# Patient Record
Sex: Male | Born: 1950 | Race: White | Hispanic: No | Marital: Married | Smoking: Former smoker
Health system: Southern US, Community
[De-identification: ages and names within clinical notes are randomized; demographics above are authoritative.]

## PROBLEM LIST (undated history)

## (undated) DIAGNOSIS — R918 Other nonspecific abnormal finding of lung field: Secondary | ICD-10-CM

## (undated) DIAGNOSIS — M1990 Unspecified osteoarthritis, unspecified site: Secondary | ICD-10-CM

## (undated) DIAGNOSIS — Z8719 Personal history of other diseases of the digestive system: Secondary | ICD-10-CM

## (undated) DIAGNOSIS — N400 Enlarged prostate without lower urinary tract symptoms: Secondary | ICD-10-CM

## (undated) DIAGNOSIS — Z9689 Presence of other specified functional implants: Secondary | ICD-10-CM

## (undated) DIAGNOSIS — G709 Myoneural disorder, unspecified: Secondary | ICD-10-CM

## (undated) DIAGNOSIS — R011 Cardiac murmur, unspecified: Secondary | ICD-10-CM

## (undated) DIAGNOSIS — Z9889 Other specified postprocedural states: Secondary | ICD-10-CM

## (undated) DIAGNOSIS — H409 Unspecified glaucoma: Secondary | ICD-10-CM

## (undated) DIAGNOSIS — Z87898 Personal history of other specified conditions: Secondary | ICD-10-CM

## (undated) DIAGNOSIS — I10 Essential (primary) hypertension: Secondary | ICD-10-CM

## (undated) DIAGNOSIS — K219 Gastro-esophageal reflux disease without esophagitis: Secondary | ICD-10-CM

## (undated) DIAGNOSIS — J449 Chronic obstructive pulmonary disease, unspecified: Secondary | ICD-10-CM

## (undated) DIAGNOSIS — M48061 Spinal stenosis, lumbar region without neurogenic claudication: Secondary | ICD-10-CM

## (undated) DIAGNOSIS — M549 Dorsalgia, unspecified: Secondary | ICD-10-CM

## (undated) DIAGNOSIS — H3530 Unspecified macular degeneration: Secondary | ICD-10-CM

## (undated) DIAGNOSIS — R112 Nausea with vomiting, unspecified: Secondary | ICD-10-CM

## (undated) DIAGNOSIS — C801 Malignant (primary) neoplasm, unspecified: Secondary | ICD-10-CM

## (undated) DIAGNOSIS — R0600 Dyspnea, unspecified: Secondary | ICD-10-CM

## (undated) DIAGNOSIS — D329 Benign neoplasm of meninges, unspecified: Secondary | ICD-10-CM

## (undated) HISTORY — DX: Other nonspecific abnormal finding of lung field: R91.8

## (undated) HISTORY — DX: Dorsalgia, unspecified: M54.9

## (undated) HISTORY — DX: Unspecified glaucoma: H40.9

## (undated) HISTORY — DX: Spinal stenosis, lumbar region without neurogenic claudication: M48.061

## (undated) HISTORY — PX: EYE SURGERY: SHX253

## (undated) HISTORY — PX: CERVICAL FUSION: SHX112

## (undated) HISTORY — PX: CYSTOSCOPY: SUR368

## (undated) HISTORY — PX: WRIST GANGLION EXCISION: SUR520

## (undated) HISTORY — PX: PILONIDAL CYST EXCISION: SHX744

## (undated) HISTORY — PX: SINUS EXPLORATION: SHX5214

---

## 1999-07-04 ENCOUNTER — Ambulatory Visit (HOSPITAL_COMMUNITY): Admission: RE | Admit: 1999-07-04 | Discharge: 1999-07-04 | Payer: Self-pay | Admitting: Geriatric Medicine

## 1999-10-26 ENCOUNTER — Encounter: Admission: RE | Admit: 1999-10-26 | Discharge: 1999-10-26 | Payer: Self-pay | Admitting: Otolaryngology

## 1999-10-26 ENCOUNTER — Encounter: Payer: Self-pay | Admitting: Otolaryngology

## 2001-09-25 ENCOUNTER — Encounter: Payer: Self-pay | Admitting: Geriatric Medicine

## 2001-09-25 ENCOUNTER — Encounter: Admission: RE | Admit: 2001-09-25 | Discharge: 2001-09-25 | Payer: Self-pay | Admitting: Geriatric Medicine

## 2002-10-19 ENCOUNTER — Ambulatory Visit (HOSPITAL_COMMUNITY): Admission: RE | Admit: 2002-10-19 | Discharge: 2002-10-19 | Payer: Self-pay | Admitting: Gastroenterology

## 2003-11-04 ENCOUNTER — Encounter (INDEPENDENT_AMBULATORY_CARE_PROVIDER_SITE_OTHER): Payer: Self-pay | Admitting: *Deleted

## 2003-11-04 ENCOUNTER — Ambulatory Visit (HOSPITAL_BASED_OUTPATIENT_CLINIC_OR_DEPARTMENT_OTHER): Admission: RE | Admit: 2003-11-04 | Discharge: 2003-11-04 | Payer: Self-pay | Admitting: Otolaryngology

## 2003-11-04 ENCOUNTER — Ambulatory Visit (HOSPITAL_COMMUNITY): Admission: RE | Admit: 2003-11-04 | Discharge: 2003-11-04 | Payer: Self-pay | Admitting: Otolaryngology

## 2004-01-30 ENCOUNTER — Encounter: Admission: RE | Admit: 2004-01-30 | Discharge: 2004-01-30 | Payer: Self-pay | Admitting: Geriatric Medicine

## 2004-02-28 ENCOUNTER — Ambulatory Visit (HOSPITAL_COMMUNITY): Admission: RE | Admit: 2004-02-28 | Discharge: 2004-02-29 | Payer: Self-pay | Admitting: Neurosurgery

## 2005-02-16 ENCOUNTER — Encounter: Admission: RE | Admit: 2005-02-16 | Discharge: 2005-02-16 | Payer: Self-pay | Admitting: Neurosurgery

## 2005-03-05 ENCOUNTER — Ambulatory Visit (HOSPITAL_COMMUNITY): Admission: RE | Admit: 2005-03-05 | Discharge: 2005-03-05 | Payer: Self-pay | Admitting: Neurosurgery

## 2005-08-31 ENCOUNTER — Encounter: Admission: RE | Admit: 2005-08-31 | Discharge: 2005-08-31 | Payer: Self-pay | Admitting: Neurosurgery

## 2006-01-04 ENCOUNTER — Encounter: Admission: RE | Admit: 2006-01-04 | Discharge: 2006-01-04 | Payer: Self-pay | Admitting: Neurosurgery

## 2006-01-28 ENCOUNTER — Inpatient Hospital Stay (HOSPITAL_COMMUNITY): Admission: RE | Admit: 2006-01-28 | Discharge: 2006-01-30 | Payer: Self-pay | Admitting: Neurosurgery

## 2006-06-08 HISTORY — PX: BACK SURGERY: SHX140

## 2007-01-02 HISTORY — PX: WRIST SURGERY: SHX841

## 2008-01-02 HISTORY — PX: KNEE ARTHROSCOPY: SUR90

## 2008-01-13 ENCOUNTER — Encounter (INDEPENDENT_AMBULATORY_CARE_PROVIDER_SITE_OTHER): Payer: Self-pay | Admitting: Orthopedic Surgery

## 2008-01-13 ENCOUNTER — Ambulatory Visit (HOSPITAL_BASED_OUTPATIENT_CLINIC_OR_DEPARTMENT_OTHER): Admission: RE | Admit: 2008-01-13 | Discharge: 2008-01-13 | Payer: Self-pay | Admitting: Orthopedic Surgery

## 2008-01-29 ENCOUNTER — Encounter: Admission: RE | Admit: 2008-01-29 | Discharge: 2008-01-29 | Payer: Self-pay | Admitting: Geriatric Medicine

## 2008-03-08 ENCOUNTER — Ambulatory Visit (HOSPITAL_COMMUNITY): Admission: RE | Admit: 2008-03-08 | Discharge: 2008-03-08 | Payer: Self-pay | Admitting: Neurosurgery

## 2008-04-29 ENCOUNTER — Encounter: Admission: RE | Admit: 2008-04-29 | Discharge: 2008-04-29 | Payer: Self-pay | Admitting: Neurosurgery

## 2008-07-09 ENCOUNTER — Encounter: Admission: RE | Admit: 2008-07-09 | Discharge: 2008-07-09 | Payer: Self-pay | Admitting: Orthopedic Surgery

## 2009-07-26 ENCOUNTER — Encounter: Admission: RE | Admit: 2009-07-26 | Discharge: 2009-07-26 | Payer: Self-pay | Admitting: Neurosurgery

## 2009-07-28 ENCOUNTER — Encounter: Admission: RE | Admit: 2009-07-28 | Discharge: 2009-07-28 | Payer: Self-pay | Admitting: Neurosurgery

## 2010-04-13 LAB — BASIC METABOLIC PANEL
BUN: 18 mg/dL (ref 6–23)
CO2: 25 mEq/L (ref 19–32)
Calcium: 9.3 mg/dL (ref 8.4–10.5)
GFR calc Af Amer: >60 mL/min (ref >60)
Glucose, Bld: 99 mg/dL (ref 70–99)

## 2010-04-13 LAB — CBC
HCT: 39 % (ref 39.0–52.0)
Hemoglobin: 13.1 g/dL (ref 13.0–17.0)
MCHC: 33.6 g/dL (ref 30.0–36.0)
Platelets: 323 10*3/uL (ref 150–400)
RBC: 5.01 MIL/uL (ref 4.22–5.81)
WBC: 5.7 10*3/uL (ref 4.0–10.5)

## 2010-04-13 LAB — DIFFERENTIAL
Basophils Relative: 1 % (ref 0–1)
Monocytes Relative: 15 % — ABNORMAL HIGH (ref 3–12)

## 2010-04-13 LAB — TYPE AND SCREEN

## 2010-04-17 LAB — POCT I-STAT, CHEM 8
BUN: 22 mg/dL (ref 6–23)
Calcium, Ion: 1.18 mmol/L (ref 1.12–1.32)
Chloride: 102 mEq/L (ref 96–112)
Potassium: 4.4 mEq/L (ref 3.5–5.1)
Sodium: 140 mEq/L (ref 135–145)

## 2010-05-16 NOTE — Op Note (Signed)
NAME:  Cody Tate, Cody Tate NO.:  [id number]   MEDICAL RECORD NO.:  [id number]          PATIENT TYPE:  OIB   LOCATION:  3533                         FACILITY:  [facility]   PHYSICIAN:  Kathaleen Maser. Pool, M.D.    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   PROCEDURE:  C5-6 and C6-7 anterior cervical diskectomy and fusion with  allograft and plating.   SURGEON:  Kathaleen Maser. Pool, MD   ASSISTANT:  Donalee Citrin, MD   ANESTHESIA:  General endotracheal.   INDICATION:  Mr. Dente is a 60 year old male with history of neck and  upper extremity pain, paresthesias and weakness consistent with early  cervical myelopathy.  Workup demonstrates evidence of cervical  spondylosis with broad-based disk bulging and resultant stenosis at the  C5-6 level and C6-7 level.  The patient presents now for two-level  anterior cervical diskectomy and fusion allografting and plating in  hopes of improving his symptoms.   OPERATIVE NOTE:  The patient was taken to the operating room and placed  on the operating table in supine position.  After adequate level of  anesthesia was achieved, the patient was positioned supine with neck  slightly extended up and held in place with halter traction.  The  patient's anterior cervical region was prepped and draped sterilely.  A  10 blade was used to make a linear skin incision overlying the C6  vertebral level.  This was carried sharply down to the platysma.  The  platysma then divided vertically and dissection proceeded on the medial  border of the sternocleidomastoid muscle and carotid sheath.  Trachea  and esophagus were mobilized and tracked towards the left.  Prevertebral  fascia was stripped off  the anterior spinal column.  The longus colli  muscle was elevated bilaterally using electrocautery.  Deep self-  retaining retractor was placed.  Intraoperative fluoroscopy was used and  the C5-6 and C6-7 levels were confirmed.  Disk space then incised with  15 blade in rectangular fashion.  Wide disk space clean-out then  achieved using pituitary rongeurs, forward and backward Carlens  curettes, Kerrison rongeurs, and high-speed drill.  The elements of disk  removed down to the level of the posterior annulus.  Microscope was then  brought into field and used throughout the remainder of the diskectomy.  The remaining aspects of the annulus and osteophytes were removed using  a high-speed drill down to the level of the posterior longitudinal  ligament.  Posterior longitudinal ligament was then elevated and  resected in piecemeal fashion using Kerrison rongeur rongeurs.  The  underlying thecal sac was then identified.  A wide central decompression  was then performed by undercutting the bodies of C5 and C6.  Decompression then proceeded out in each neural foramen.  Wide anterior  foraminotomies were then performed along the course of the exiting  C6  nerve roots bilaterally.  At this point, a very thorough decompression  was achieved.  There was no evidence of injury to thecal sac or nerve  roots.  The C6-7 level was then decompressed in a similar fashion.  Wound was then irrigated with antibiotic solution.  Gelfoam was placed  topically for hemostasis and found to be good.  A 7-mm Cornerstone  allograft wedge was then impacted into place at C5-6, an 8-mm wedge was  then impacted into place at C6-7.  A 47-mm Atlantis anterior cervical  plate was then placed over the C5, C6 and C7 levels.  This was then  attached under fluoroscopic guidance using 13-mm variable angle screws  to each at all three levels.  All six screws were given final tightening  and found to be solid bone.  Locking screws  were engaged in all three  levels.  Final images revealed good position of the bone grafts and  hardware at proper operative level with normal alignment of spine.  The  wound was then irrigated with antibiotic solution.  Hemostasis was  ensured with bipolar electrocautery.  Wound was then closed in typical  fashion.  Steri-Strips and sterile dressings  were applied.  There were  no complications.  The patient tolerated the procedure well and he  returns to the recovery room postoperatively.           ______________________________  Kathaleen Maser Pool, M.D.     HAP/MEDQ  D:  03/08/2008  T:  03/08/2008  Job:  [id number]

## 2010-05-16 NOTE — Op Note (Signed)
NAME:  CARLIN, ATTRIDGE NO.:  [id number]   MEDICAL RECORD NO.:  [id number]          PATIENT TYPE:  AMB   LOCATION:  DSC                          FACILITY:  [facility]   PHYSICIAN:  Katy Fitch. Sypher, M.D. 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   OPERATION:  Radical resection of large mass, left volar radial wrist  with sacrifice of most palmar branch of radial superficial sensory  branches and partial sacrifice of middle radial superficial sensory  branch and lysis of radial artery and resection with deep forearm  fascia, periosteum, first dorsal compartment, and flexor sheath of  flexor carpi radialis and pronator teres muscles for margin.  This would  be considered a wide marginal excision with detailed marking of the  margins of the tumor with vascular clips.  The sacrificed superficial  radial sensory branch was tagged on its proximal stump with a 6-0  Prolene suture for delayed identification on a p.r.n. basis.   OPERATING SURGEON:  Katy Fitch. Sypher, MD   ASSISTANT:  Marveen Reeks  Dasnoit, PA-C   ANESTHESIA:  General by LMA.   SUPERVISING ANESTHESIOLOGIST:  Burna Forts, MD   INDICATIONS:  Dimitrios Balestrieri is a 60 year old gentleman referred through  the courtesy of Dr. Merlene Laughter for evaluation of a rapidly enlarging  right volar wrist tumor.   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  He  notes that approximately 2 years ago he had a small mass on the volar  aspect of his left radial wrist adjacent to the radial artery.  He was  seen by an orthopedic surgeon who advised him to observe it thinking  that it was more likely not a ganglion cyst.   Over the ensuing 18-24 months, he noted rather dramatic increase in size  of this mass.   Recently, he was seen by Dr. Merlene Laughter for a routine evaluation.  Dr. Pete Glatter was quite concerned about the size of the mass and the  fact that it was tenting the skin and causing some skin discoloration,  therefore an urgent hand upper extremity orthopedic consult was  obtained.   Mr. Handel was sent for an as-soon-as-possible MRI of the wrist with  contrast.  The MRI was discussed in detail with the attending  radiologist, Dr. Vear Clock, who  was of the opinion that this represented  a soft tissue tumor.  The tumor was not pushing structures aggressively  and did not appear to be causing erosion of the radius.  The radial  artery was subjacent to the tumor and well visualized with the contrast  imaging study.  The tumor abutted the flexor carpi radialis, the  periosteum of the radius, the first dorsal compartment and could not be  separated from the radial superficial sensory branches.   A specific diagnosis could not be entertained short of a tissue  diagnosis.   I had a lengthy informed consent with Mr. Ferrara, which I pointed out  that there was a small chance that this could represent a malignant  lesion.  We will follow the principles of tumor surgery in approaching  this with the  biopsy, attempting to obtain wide margins and if need be  sacrificing neurovascular structures that are involved with tumor.   Preoperatively, he was advised in detail that we could not offer  prognosis until we had a histopathologic diagnosis.  I pointed out the  soft tissue tumors of this nature many times are referred to tertiary  care institutions and national specialists for identification of  atypical musculoskeletal pathology.   Our goal was to perform either an incisional or excisional biopsy based  on what we identified at the time of surgery.  He was advised that we  would need to wait at least 10 days to have a histopathologic response  from the pathologist.   Questions were invited and answered in detail.   PROCEDURE:  Zadie Rhine was brought to the operating room and  placed int he supine position upon the operating table.   Following a consult with Dr. Jacklynn Bue, general anesthesia by LMA  technique was recommended and accepted by Mr. Angus.   He was brought to room #6 of the [facility], placed in the  supine position upon the operating table, and under Dr. Marlane Mingle  direct supervision, general anesthesia by LMA technique was induced.   The left arm was prepped with Betadine soap and solution, sterilely  draped.  Ancef 1 g was administered as an IV prophylactic antibiotic.   The left arm was exsanguinated with Esmarch bandage and the arterial  tourniquet was inflated at 250 mmHg on the proximal left brachium.   The procedure commenced with planning of an extensile exposure from the  mid distal forearm over the radial artery to the base of the thumb  metacarpal.  Our plan was to identify the radial artery and its  accompanying veins proximal and distal to the mass as well as the radial  superficial sensory branches and the flexor carpi radialis and first  dorsal compartment muscles.   The skin incision was taken sharply and skin flaps were  elevated with a  margin of at least 4-5 mm of normal-appearing tissue off the mass.  The  radial artery and its accompanying veins were identified proximally and  distally and identified by vessel loop wraps.  The lesion was densely  adherent to the first dorsal compartment, abutted the fascia of the  flexor carpi radialis, and encapsulated the radial artery, but did not  invade the radial artery.   With great care, the tumor was resected with a 1-cm margin of fat and  soft tissue when possible and on the radius, it was resected directly  off the periosteum of the radius.  The first dorsal compartment was  resected  with the mass, the fascia of the flexor carpi radialis, and the  fascia overlying the pronator teres were resected with the mass and  after careful study, it was apparent that the palmar radial superficial  sensory branch was intimately involved with the mass, therefore this was  resected with a more than 1-cm margin and the proximal stump of the  nerve tagged with a 6-0 Prolene suture.  A secondary branch of the  middle radial superficial sensory branches was also involved directly in  the mass and this was sacrificed as well.  The main portion of the  middle branch was dissected free and protected with a vessel loop.  The  feeding vessels and vessels that coursing through the branch were both  suture ligated and marked at the margins of the periosteum and at deep  feeding points with metal vascular clamps.  Should a secondary  dissection be required, the clamps could serve as landmarks for the  margins of the lesion as well as blood supply.   After a very lengthy dissection under tourniquet conditions, the radial  artery was removed from the pseudocapsule and preserved.  The veins  accompanying the radial artery were sacrificed and they appeared to have  drainage from the mass and ultimately the mass was resected directly off  the periosteum of the radius, radial to the  insertion of pronator  quadratus muscle.  After completing the excision the mass, the mass was  passed off in formalin and sent to the Surgical Pathology Lab.   The wound bed was irrigated thoroughly followed by release of the  tourniquet.  We had no significant bleeding from the radial artery.  The  areas of punctate bleeding along the fascial margins were  electrocauterized with bipolar current followed by repair of the skin  with subcutaneous suture of 4-0 Vicryl and segmental intradermal 3-0  Prolene suture.   There were no apparent complications.   Mr. Lenker tolerated the surgery and anesthesia well.  He was placed in  compressive dressing and awakened from general anesthesia.  He will be  observed in the Postanesthesia Care Unit and discharged with the care of  his family with prescriptions for Percocet 5 mg 1 p.o. q.4-6 h. p.r.n.  pain, also Keflex 500 mg 1 p.o. q.8 h. x4 days as a prophylactic  antibiotic.  He is encouraged to use Motrin or ibuprofen over the  counter as needed.      Katy Fitch Sypher, M.D.  Electronically Signed     RVS/MEDQ  D:  01/13/2008  T:  01/14/2008  Job:  [id number]   cc:   Hal T. Stoneking, M.D.

## 2010-05-19 NOTE — Op Note (Signed)
NAME:  Cody Tate, Cody Tate NO.:  [id number]   MEDICAL RECORD NO.:  [id number]          PATIENT TYPE:  INP   LOCATION:  2899                         FACILITY:  [facility]   PHYSICIAN:  Kathaleen Maser. Pool, M.D.    DATE OF BIRTH:  1950-02-09   DATE OF PROCEDURE:  01/28/2006  DATE OF DISCHARGE:                               OPERATIVE REPORT   PREOPERATIVE DIAGNOSES:  1. 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  2. L5-S1 stenosis.   POSTOPERATIVE DIAGNOSES:  1. 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  2. L5-S1 stenosis.   PROCEDURE NAMES:  1. Bilateral L3-L4 redo laminectomy with foraminotomies, bilateral L4-      5 redo laminectomy with foraminotomies, more than would be required      for interbody fusion alone at both levels.  2. L5-S1 decompressive laminectomy with foraminotomies.  3. L3-4 and L4-5 posterior lumbar fusion utilizing tangent allograft      wedges, and Telamon interbody Peek cages and local autografting.  4. Posterolateral arthrodesis utilizing segmental pedicle screw      fixation from L3-L5 with local autografting.   SURGEON:  Kathaleen Maser. Pool, M.D.   ASSISTANT:  Donalee Citrin, M.D.   ANESTHESIA:  General endotracheal.   INDICATIONS:  Mr. Wyne is a 60 year old male status post previous L3-4  and L4-5 bilateral decompressive laminotomies for treatment of his  spondylitic stenosis.  The patient presents now with increasing back  pain, his workup demonstrating evidence of post laminectomy  spondylolisthesis at L3-4 and L4-5.  The patient has worsening stenosis  present at L5-S1, however, the disk space is well-preserved and there is  no evidence of overt instability at this level.  The patient presents  now for decompression and fusion in hopes of improving his symptoms.   OPERATIVE NOTE:  The patient was brought to the operating table in a  supine position.  After local  anesthesia was achieved, the patient was  placed prone onto a Wilson frame, the patient's lumbar regions prepped  and sterilely prepped.  A 10 blade was used to make a linear skin  incision extending from L2 down to the sacrum.  This was carried sharply  in the midline.  Subperiosteal dissection performed exposing the lamina  facet joints of L2-L3, L4-L5, and S1.  Deep self retaining retractor was  placed.  Intraoperative fluoroscopy was used and the levels were  confirmed.  Previous laminotomies at L3-4 and L4-5 were dissected free  using dental instruments.  Complete laminectomies of L3-L4 were then  performed using Leksell rongeurs, Kerrison rongeurs, high-speed drill to  remove the entire lamina as well as the entire inferior facet of L3 and  L4 bilaterally.  Superior facetectomies of L4-L5 were performed  bilaterally.  All bone was cleaned and used in later autografting.  The  decompression was then carried down along the lamina at L5 where an  entire laminectomy at L5 was performed.  The facet joints at L5-S1 were  undercut, fully decompressing the exiting L5  and S1 nerve roots.  At  this point, a very thorough decompression was achieved.  Starting first  at L3-4, epidural venous plexus coagulating cut.  The thecal sac was  mobilized and tracked towards the midline.  The disk space was then  incised with a #15 blade __________ was achieved using upward-angled  pituitary rongeurs and Epstein curettes.  The procedure was then  repeated on the contralateral side and then repeated bilaterally at L4-  5.  Returning back to L3-4 on the patient's left side, a 10 mm  distractor was placed.  Thecal sac nerve was tracked on the right side.  Disk space was then reamed and then cut with Tangent instrument.  Soft  tissues were removed from the interspace.  A 10 x 26 mm Telamon cage  packed with morselized autograft and DBX putty was then impacted into  place and recessed approximately 2 mm from  the posterior cortical margin  of L3.  The distractor was then removed from the patient's left side.  Thecal sac and nerve roots were protected on the left side.  Disk space  was once again reamed and then cut with 10 mm tangent instruments.  Soft  tissues were removed from the interspace.  Disk space was further  curettaged.  Morselized autograft was impacted in the interspace at L3-  4.  A 10 x 26 mm tangent wedge was then impacted into place and recessed  roughly 2 mm posterior cortical  margin.  The procedure was then  repeated at L4-5 in a similar fashion with no complication.  Pedicles of  L3, L4, and L5 were then identified using superficial landmarks and  intraoperative fluoroscopy.  Superficial bone around the pedicles was  then removed using a  high-speed drill.  Each pedicle was then probed  using a pedicle awl.  Each pedicle awl track was then tapped with  __________ screw tapper.  Each screw taper hole was probed and found to  be solid in bone.  6.75 x 45 mm radius screws were placed bilaterally at  L3 and L4.  6.75 x 40 mm screws were placed bilaterally at L5.  The  transverse processes at L3, L4, and L5 were then decorticated using a  high-speed drill.  Morselized autograft was then packed posterolaterally  for later fusion.  Short segment titanium rod was then placed over the  screw heads from L3-L5.  Locking caps were then placed over the screw  heads.  Locking caps were then engaged with the construct under  compression.  Transverse connector was placed.  Final images revealed  good position of bone graft and hardware __________.  The wound was then  inspected for hemostasis which was found to be good.  Gelfoam was placed  topically.  Hemostasis was found to be good.  Medium Hemovac drains in  epidural space.  The wound was then closed in layers with Vicryl  sutures.  Steri-Strips and sterile dressings were applied.  There were no complications.  The patient tolerated the  procedure well and he  returns to recovery room for postoperative care.           ______________________________  Kathaleen Maser. Pool, M.D.     HAP/MEDQ  D:  01/28/2006  T:  01/28/2006  Job:  [id number]

## 2010-05-19 NOTE — H&P (Signed)
NAME:  Cody Tate, ELLWOOD NO.:  [id number]   MEDICAL RECORD NO.:  [id number]          PATIENT TYPE:  AMB   LOCATION:  DSC                          FACILITY:  [facility]   PHYSICIAN:  Hermelinda Medicus, M.D.   DATE OF BIRTH:  [birth year]   DATE OF ADMISSION:  11/04/2003  DATE OF DISCHARGE:                                HISTORY & PHYSICAL   HISTORY OF PRESENT ILLNESS:  This patient is a 60 year old male who I have  followed for sinus problems for approximately 2 years and he has had 10  episodes of sinus problems or sinus infections treated with antibiotics.  The more recent need for antibiotics has also led to Avelox and Tequin,  where the lesser antibiotics have not worked for him.  He also has used the  steroid nasal sprays, primarily Nasacort; he has also used decongestants  faithfully.  A CAT scan was ordered in '03, which looked quite good, but the  CAT scan we ordered after treatment in September 2005 showed right maxillary  and bilateral ethmoid sinusitis with turbinate hypertrophy.  He now enters  for a functional endoscopic sinus surgery, right maxillary sinus ostial  enlargement with bilateral ethmoidectomy and turbinate reduction.  He has  been on Avelox recently.  He also has a history of having had a right  orbital cellulitis in his distant past before I had ever seen him which was  stated to be associated with his sinus problems.   MEDICATIONS:  His past history is that of using Flomax, hydrochlorothiazide,  Altace, Nexium and then the Nasonex, and decongestant.   ALLERGIES:  He does have an allergy to SULFA, SURGICAL TAPE.   PAST MEDICAL HISTORY:  He had a cataract done in February 2003 and he does  have a hypertensive history, and apparently had a heart murmur, but there is  no problem at this time, cannot hear this.   REVIEW OF SYSTEMS:  The remainder of his review of systems reveals just a  reflux esophagitis problem.   PHYSICAL EXAMINATION:  VITAL  SIGNS:  His physical examination reveals a  blood pressure of 156/92, his pulse is 101, respirations 20.  He weighs 209.  HEENT:  Ears are clear.  Oral cavity is clear.  The nose shows some  congestion with turbinate hypertrophy; he is on antibiotic at this time.  Larynx is clear.  True cords, false cords, epiglottis and base of tongue are  clear.  True cord mobility, gag reflex, tongue mobility, EOMs and facial  nerves were also symmetrical.  NECK:  His neck is free of any thyromegaly, cervical adenopathy or mass.  CHEST:  Chest is clear, no rales, rhonchi or wheezes.  CARDIOVASCULAR:  No opening snaps, murmurs or gallops.   IMAGING STUDIES:  His chest x-ray was clear.  Chest x-ray showed no changes,  no active disease.   His EKG showed some nonspecific T wave abnormalities, possibly due to  myocardial ischemia.   INITIAL DIAGNOSIS:  Bilateral ethmoid and right maxillary sinusitis with  turbinate hypertrophy.       JC/MEDQ  D:  11/04/2003  T:  11/04/2003  Job:  [id number]   cc:   Hal T. Stoneking, M.D.  [address] [place], [place] [address]  Fax: [phone]

## 2010-05-19 NOTE — Op Note (Signed)
NAME:  Cody Tate, Cody Tate NO.:  [id number]   MEDICAL RECORD NO.:  [id number]          PATIENT TYPE:  OIB   LOCATION:  5035                         FACILITY:  [facility]   PHYSICIAN:  Kathaleen Maser. Pool, M.D.    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   OPERATION PERFORMED:  Bilateral lumbar 4-5 decompressive laminotomies with  foraminotomies.   SURGEON:  Kathaleen Maser. Pool, M.D.   ASSISTANT:  Reinaldo Meeker, M.D.   ANESTHESIA:  General endotracheal.   INDICATIONS:  [name] is a 60 year old male with a history of bilateral  lower extremity pain, paresthesias and weakness consistent with neurogenic  claudication.  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  The patient  has no significant back pain.  He presents now for bilateral L4-5  decompressive laminotomies and foraminotomies.  The patient is aware of the  risks and benefits, and wishes to proceed.   DESCRIPTION OF OPERATION:  The patient was brought to the operating room and  placed on the operating table in the supine position.  After an adequate  level of anesthesia was achieved the patient was placed prone onto a Wilson  frame, appropriately padded.  The patient's lumbar area was prepped and  draped sterilely.  A 10 blade and electrocautery were used to make a skin  incision overlying the L4-5 interspace. This was carried down sharply in the  midline.  Subperiosteal dissection was performed to expose the lamina and  facet joints of  L4 and L5.  Deep self-retaining retractors were placed.  Intraoperative x-rays were taken and the level was confirmed.   Decompressive laminotomy was then performed bilaterally using the high-speed  drill and Kerrison  rongeurs to remove the inferior aspect of the lamina  above the medial aspect of the facet joint bilaterally and the superior  aspect of the lamina below bilaterally.  The ligamentum flavum was then  elevated and resected in a piecemeal fashion.  The underlying thecal sac and  connecting L5 nerve roots were identified bilaterally.  Wide decompressive  foraminotomies were then performed along the course of the exiting L5 nerve  root bilaterally.  There was no evidence of injury to the thecal sac or  nerve roots.  There was no disk herniation or other stenosis.   The wound was then irrigated with antibiotic solution.  Gelfoam was placed.  Operative hemostasis was found to be good.  The microscope and the  retraction system were removed.  Hemostasis was achieved with  electrocautery.  The wound was then closed inlayers with Vicryl sutures.  Steri-strips and sterile dressing were applied.   COMPLICATIONS:  There were perioperative complications.   DISPOSITION:  The patient tolerated the procedure well and was taken to the  recovery room postoperatively.      HAP/MEDQ  D:  02/28/2004  T:  02/29/2004  Job:  [id number]

## 2010-05-19 NOTE — Op Note (Signed)
NAME:  Cody Tate, Cody Tate NO.:  [id number]   MEDICAL RECORD NO.:  [id number]          PATIENT TYPE:  AMB   LOCATION:  DSC                          FACILITY:  [facility]   PHYSICIAN:  Hermelinda Medicus, M.D.   DATE OF BIRTH:  [birth year]   DATE OF PROCEDURE:  DATE OF DISCHARGE:                                 OPERATIVE REPORT   PREOPERATIVE DIAGNOSES:  1.  Bilateral ethmoid and right maxillary sinusitis.  2.  History of orbital cellulitis with turbinate hypertrophy.   POSTOPERATIVE DIAGNOSES:  1.  Bilateral ethmoid and right maxillary sinusitis.  2.  History of orbital cellulitis with turbinate hypertrophy.   OPERATION:  Functional endoscopic sinus surgery, bilateral ethmoidectomy  with right maxillary sinus ostial enlargement and turbinate reduction using  the Elmed bipolar.   SURGEON:  Hermelinda Medicus, M.D.   ANESTHESIA:  Local MAC per Dr. Gelene Mink.   DESCRIPTION OF PROCEDURE:  The patient placed in the supine position and  under local MAC anesthesia, the patient's nose was anesthetized using  topical cocaine 200 mg and 1% Xylocaine with epinephrine.  The inferior  turbinates were out fractured aggressively and more space was obtained  within his nose.  The middle turbinates were in fractured and then for the  first time we could see the ethmoid ostia with reasonable ease.  Local  anesthesia was instilled in the anterior ethmoid bulla and then this was  taken down using the straight Blakesley-Wilde as well as the upbiting  Blakesley-Wilde opening the sinus so that we could remove the polypoid and  cystic debris.  Once this was removed on the left side, the sinus was  cleared of excess material.  A 0 degree scope was used.  We then transferred  interest to the right side and again the right ethmoid sinus was opened  using the 0 degree scope.  The upbiting and straight Blakesley-Wilde and all  polypoid and thick mucous membrane was removed that was blocking the sinus.  The more posterior aspect of the sinus was in better condition but  considerable polypoid material was found.  Once this was completed, we could  see into the sinus.  We could see superiorly and posteriorly and the minimal  blood loss was occurring approximately less than 5 mL.  Attention was then  carried to the right maxillary sinus where the natural ostium was  essentially a pinhole and curved suction was used to establish that location  more effectively.  The side biting forceps was then used as well as the 0  degree scope and this was opened approximately five times its normal size.  We then could see into this.  The angled Blakesley-Wilde was also used in  the sinus.  Then we could view inside the maxillary sinus which showed some  membrane that was somewhat grayish and swollen but I think will revert back  to its more normal status now that it is more open.  The inferior turbinates  were then further reduced using the Elmed bipolar cautery and this worked  well on both sides and then the intranasal dressing using the  Gelfoam in the  ethmoid, Gelfilm to hold the middle turbinates medial and Telfa within the  nose.  The patient tolerated the procedure well and is doing well  postoperatively.   Follow-up will be today in the office for packing removal and then five  days, then 10 days, then three weeks, six weeks, three months, six months  and a year.       JC/MEDQ  D:  11/04/2003  T:  11/04/2003  Job:  [id number]   cc:   Hal T. Stoneking, M.D.  [address] [place], [place] [address]  Fax: [phone]

## 2010-05-19 NOTE — Op Note (Signed)
   NAME:  ALP, GOLDWATER NO.:  [id number]   MEDICAL RECORD NO.:  [id number]                   PATIENT TYPE:  AMB   LOCATION:  ENDO                                 FACILITY:  [facility]   PHYSICIAN:  Danise Edge, M.D.                DATE OF BIRTH:  [birth year]   DATE OF PROCEDURE:  10/19/2002  DATE OF DISCHARGE:                                 OPERATIVE REPORT   PROCEDURE:  Colonoscopy.   INDICATIONS:  Cody Tate is a  61 year old man born Sep 08, 1950.  Mr.  Tate is scheduled to undergo a diagnostic colonoscopy to evaluate  intermittent painless hematochezia.   ENDOSCOPIST:  Danise Edge, M.D.   PREMEDICATION:  Versed 7.5 mg, Demerol 50 mg.   DESCRIPTION OF PROCEDURE:  After obtaining informed consent, Cody Tate was  placed in the left lateral decubitus position.  I administered intravenous  Versed and intravenous Demerol to achieve conscious sedation for the  procedure.  The patient's blood pressure, oxygen saturation and cardiac  rhythm were monitored throughout the procedure and documented in the medical  record.   Anal inspection was normal.  Digital rectal exam revealed a nonnodular  prostate.  The Olympus pediatric  colonoscope was introduced into the rectum  and advanced to the cecum.  Colonic preparation for the exam today was  excellent.  Rectum:  Normal.  Large, nonbleeding internal hemorrhoids.  Sigmoid colon and descending colon:  Extensive left colonic diverticulosis.  Splenic flexure:  Normal.  Transverse colon:  Normal.  Hepatic flexure:  Normal.  Ascending colon:  Normal.  Cecum and ileocecal valve:  Normal.   ASSESSMENT:  1. Large nonbleeding internal hemorrhoids.  2. Extensive left colonic diverticulosis.  3. No endoscopic evidence for the presence of colorectal neoplasia.                                               Danise Edge, M.D.   MJ/MEDQ  D:  10/19/2002  T:  10/19/2002  Job:  [id number]   cc:   Hal T.  Stoneking, M.D.  [address] [place], [place] [address]  Fax: [phone]

## 2010-05-19 NOTE — Op Note (Signed)
NAME:  Cody Tate, Cody Tate NO.:  [id number]   MEDICAL RECORD NO.:  [id number]          PATIENT TYPE:  AMB   LOCATION:  SDS                          FACILITY:  [facility]   PHYSICIAN:  Henry A. Pool, M.D.    DATE OF BIRTH:  04/12/1950   DATE OF PROCEDURE:  03/05/2005  DATE OF DISCHARGE:                                 OPERATIVE REPORT   PREOPERATIVE DIAGNOSES:  L3-4 multifactorial stenosis.   POSTOPERATIVE DIAGNOSES:  L3-4 multifactorial stenosis.   OPERATION PERFORMED:  Bilateral L3-4 decompressive laminotomies and  foraminotomies.   SURGEON:  Kathaleen Maser. Pool, M.D.   ASSISTANT:  Donalee Citrin, M.D.   ANESTHESIA:  General endotracheal.   INDICATIONS FOR PROCEDURE:  The patient is a 60 year old male who is status  post previous L4-5 decompression.  The patient presents now with recurrent  back and bilateral lower extremity symptoms.  Work-up demonstrates evidence  of severe stenosis now developing at the L3-4 level.  We discussed options  available for management  including the possibility of undergoing bilateral  L3-4 decompressive laminotomies and foraminotomies in hopes of improving his  symptoms.  We discussed the risks and benefits involved with surgery and the  patient wishes to proceed.   DESCRIPTION OF PROCEDURE:  The patient was taken to the operating room and  placed on the table in the supine position.  After adequate level of  anesthesia was achieved, the patient was positioned prone onto a Wilson  frame and appropriately padded.  The patient's lumbar region was prepped and  draped sterilely.  A 10 blade was used to make a linear skin incision  overlying the L3-4 interspace.  This was carried down sharply in the  midline.  A subperiosteal dissection was then performed exposing the lamina  and facet joints of L3 and L4 bilaterally.  Deep self-retaining retractor  was placed.  Intraoperative x-ray was taken, the level was confirmed.  Decompressive laminotomy was  then performed bilaterally using high speed  drill and Kerrison rongeurs to remove the inferior aspects of the lamina at  L3, medial aspect of the L3-4 facet joint and superior rim of the L4 lamina.  Ligamentum flavum was then elevated and resected in piecemeal fashion using  Kerrison rongeurs.  The underlying thecal sac and exiting L4 nerve roots  were identified bilaterally.  A wide decompressive foraminotomy was then  performed bilaterally along the course of the exiting L4 nerve root.  At  this point a very thorough diskectomy had been achieved.  The microscope was  brought into the field and used for microdissection of the spinal canal.  The patient's thecal sac was mobilized and retracted toward the midline.  The disk space was inspected and found to be free of any herniation.  The  wound was then irrigated with antibiotic solution.  Gelfoam was placed  topically for hemostasis which was found to be good.  Microscope and  retractor system were removed.  Hemostasis in muscle was achieved  with electrocautery.  The wound was then closed in layers with Vicryl  sutures.  Steri-Strips and sterile dressing were applied.  There were no  apparent complications.  The patient tolerated the procedure well and  returned to the recovery room postoperatively.           ______________________________  Kathaleen Maser Pool, M.D.     HAP/MEDQ  D:  03/05/2005  T:  03/05/2005  Job:  [id number]

## 2011-04-23 ENCOUNTER — Encounter (HOSPITAL_COMMUNITY): Payer: Self-pay | Admitting: Pharmacy Technician

## 2011-04-30 ENCOUNTER — Other Ambulatory Visit (HOSPITAL_COMMUNITY): Payer: Self-pay

## 2011-05-01 ENCOUNTER — Other Ambulatory Visit: Payer: Self-pay | Admitting: Neurosurgery

## 2011-05-04 ENCOUNTER — Encounter (HOSPITAL_COMMUNITY): Payer: Self-pay

## 2011-05-04 ENCOUNTER — Encounter (HOSPITAL_COMMUNITY)
Admission: RE | Admit: 2011-05-04 | Discharge: 2011-05-04 | Disposition: A | Discharge status: Home / Self Care | Payer: 59 | Source: Ambulatory Visit | Attending: Neurosurgery | Admitting: Neurosurgery

## 2011-05-04 ENCOUNTER — Encounter (HOSPITAL_COMMUNITY)
Admission: RE | Admit: 2011-05-04 | Discharge: 2011-05-04 | Disposition: A | Discharge status: Home / Self Care | Payer: 59 | Source: Ambulatory Visit | Attending: Anesthesiology | Admitting: Anesthesiology

## 2011-05-04 HISTORY — DX: Essential (primary) hypertension: I10

## 2011-05-04 HISTORY — DX: Personal history of other diseases of the digestive system: Z87.19

## 2011-05-04 HISTORY — DX: Unspecified osteoarthritis, unspecified site: M19.90

## 2011-05-04 HISTORY — DX: Gastro-esophageal reflux disease without esophagitis: K21.9

## 2011-05-04 HISTORY — DX: Cardiac murmur, unspecified: R01.1

## 2011-05-04 LAB — BASIC METABOLIC PANEL
BUN: 23 mg/dL (ref 6–23)
Calcium: 9.6 mg/dL (ref 8.4–10.5)
Creatinine, Ser: 0.73 mg/dL (ref 0.50–1.35)
GFR calc non Af Amer: >90 mL/min (ref >90)
Glucose, Bld: 100 mg/dL — ABNORMAL HIGH (ref 70–99)

## 2011-05-04 LAB — CBC
Hemoglobin: 14.8 g/dL (ref 13.0–17.0)
MCH: 29 pg (ref 26.0–34.0)
MCHC: 34.3 g/dL (ref 30.0–36.0)
MCV: 84.7 fL (ref 78.0–100.0)
RBC: 5.1 MIL/uL (ref 4.22–5.81)

## 2011-05-04 LAB — DIFFERENTIAL
Eosinophils Absolute: 0.1 10*3/uL (ref 0.0–0.7)
Eosinophils Relative: 1 % (ref 0–5)
Lymphs Abs: 2.1 10*3/uL (ref 0.7–4.0)
Monocytes Relative: 11 % (ref 3–12)

## 2011-05-04 NOTE — Pre-Procedure Instructions (Addendum)
20 Cody Tate  05/04/2011   Your procedure is scheduled on:  05/08/11  Report to [facility] at 530* AM.  Call this number if you have problems the morning of surgery: [phone]   Remember:   Do not eat food:After Midnight.  May have clear liquids: up to 4 Hours before arrival. 130 am  Clear liquids include soda, tea, black coffee, apple or grape juice, broth.  Take these medicines the morning of surgery with A SIP OF WATER: flomax, eye drops, nexium, lorcet, metoprolol,  STOP fish oil, multi vit, ibuprofen, any  Aspirin herbal meds ,blood thinners   Do not wear jewelry, make-up or nail polish.  Do not wear lotions, powders, or perfumes. You may wear deodorant.  .  Do not bring valuables to the hospital.  Contacts, dentures or bridgework may not be worn into surgery.  Leave suitcase in the car. After surgery it may be brought to your room.  For patients admitted to the hospital, checkout time is 11:00 AM the day of discharge.   Patients discharged the day of surgery will not be allowed to drive home.  Name and phone number of your driver: Clydie Braun  [phone]  Special Instructions: CHG Shower Use Special Wash: 1/2 bottle night before surgery and 1/2 bottle morning of surgery.   Please read over the following fact sheets that you were given: Pain Booklet, Coughing and Deep Breathing, Blood Transfusion Information, MRSA Information and Surgical Site Infection Prevention

## 2011-05-07 MED ORDER — DEXAMETHASONE SODIUM PHOSPHATE 10 MG/ML IJ SOLN
10.0000 mg | INTRAMUSCULAR | Status: AC
  Administered 2011-05-08: 10 mg via INTRAVENOUS
  Filled 2011-05-07 (×2): qty 1

## 2011-05-07 MED ORDER — CEFAZOLIN SODIUM-DEXTROSE 2-3 GM-% IV SOLR
2.0000 g | INTRAVENOUS | Status: AC
  Administered 2011-05-08: 2 g via INTRAVENOUS
  Filled 2011-05-07 (×2): qty 50

## 2011-05-08 ENCOUNTER — Encounter (HOSPITAL_COMMUNITY)
Admission: RE | Disposition: A | Discharge status: Home / Self Care | Payer: Self-pay | Source: Ambulatory Visit | Attending: Neurosurgery

## 2011-05-08 ENCOUNTER — Encounter (HOSPITAL_COMMUNITY): Payer: Self-pay | Admitting: *Deleted

## 2011-05-08 ENCOUNTER — Ambulatory Visit (HOSPITAL_COMMUNITY): Payer: 59

## 2011-05-08 ENCOUNTER — Encounter (HOSPITAL_COMMUNITY): Payer: Self-pay | Admitting: Certified Registered"

## 2011-05-08 ENCOUNTER — Ambulatory Visit (HOSPITAL_COMMUNITY): Payer: 59 | Admitting: Certified Registered"

## 2011-05-08 ENCOUNTER — Inpatient Hospital Stay (HOSPITAL_COMMUNITY)
Admission: RE | Admit: 2011-05-08 | Discharge: 2011-05-09 | DRG: 460 | Disposition: A | Discharge status: Home / Self Care | Payer: 59 | Source: Ambulatory Visit | Attending: Neurosurgery | Admitting: Neurosurgery

## 2011-05-08 ENCOUNTER — Encounter (HOSPITAL_COMMUNITY): Payer: Self-pay | Admitting: Neurosurgery

## 2011-05-08 DIAGNOSIS — K219 Gastro-esophageal reflux disease without esophagitis: Secondary | ICD-10-CM | POA: Diagnosis present

## 2011-05-08 DIAGNOSIS — Z87891 Personal history of nicotine dependence: Secondary | ICD-10-CM

## 2011-05-08 DIAGNOSIS — I10 Essential (primary) hypertension: Secondary | ICD-10-CM | POA: Diagnosis present

## 2011-05-08 DIAGNOSIS — Z79899 Other long term (current) drug therapy: Secondary | ICD-10-CM

## 2011-05-08 DIAGNOSIS — Z9109 Other allergy status, other than to drugs and biological substances: Secondary | ICD-10-CM

## 2011-05-08 DIAGNOSIS — M129 Arthropathy, unspecified: Secondary | ICD-10-CM | POA: Diagnosis present

## 2011-05-08 DIAGNOSIS — Z01818 Encounter for other preprocedural examination: Secondary | ICD-10-CM

## 2011-05-08 DIAGNOSIS — Z01812 Encounter for preprocedural laboratory examination: Secondary | ICD-10-CM

## 2011-05-08 DIAGNOSIS — Z01810 Encounter for preprocedural cardiovascular examination: Secondary | ICD-10-CM

## 2011-05-08 DIAGNOSIS — M48062 Spinal stenosis, lumbar region with neurogenic claudication: Principal | ICD-10-CM | POA: Diagnosis present

## 2011-05-08 DIAGNOSIS — Z888 Allergy status to other drugs, medicaments and biological substances status: Secondary | ICD-10-CM

## 2011-05-08 SURGERY — POSTERIOR LUMBAR FUSION 1 WITH HARDWARE REMOVAL
Anesthesia: General | Site: Back | Wound class: Clean

## 2011-05-08 MED ORDER — LIDOCAINE HCL (CARDIAC) 20 MG/ML IV SOLN
INTRAVENOUS | Status: DC | PRN
  Administered 2011-05-08: 80 mg via INTRAVENOUS

## 2011-05-08 MED ORDER — PROPOFOL 10 MG/ML IV EMUL
INTRAVENOUS | Status: DC | PRN
  Administered 2011-05-08: 200 mg via INTRAVENOUS

## 2011-05-08 MED ORDER — HYDROMORPHONE HCL PF 1 MG/ML IJ SOLN
INTRAMUSCULAR | Status: AC
  Filled 2011-05-08: qty 1

## 2011-05-08 MED ORDER — RAMIPRIL 10 MG PO CAPS
10.0000 mg | ORAL_CAPSULE | Freq: Every evening | ORAL | Status: DC
  Administered 2011-05-08: 10 mg via ORAL
  Filled 2011-05-08 (×2): qty 1

## 2011-05-08 MED ORDER — MIDAZOLAM HCL 5 MG/5ML IJ SOLN
INTRAMUSCULAR | Status: DC | PRN
  Administered 2011-05-08: 2 mg via INTRAVENOUS

## 2011-05-08 MED ORDER — SODIUM CHLORIDE 0.9 % IR SOLN
Status: DC | PRN
  Administered 2011-05-08: 09:00:00

## 2011-05-08 MED ORDER — SODIUM CHLORIDE 0.9 % IV SOLN
10.0000 mg | INTRAVENOUS | Status: DC | PRN
  Administered 2011-05-08: 20 ug/min via INTRAVENOUS

## 2011-05-08 MED ORDER — TAMSULOSIN HCL 0.4 MG PO CAPS
0.4000 mg | ORAL_CAPSULE | Freq: Two times a day (BID) | ORAL | Status: DC
  Administered 2011-05-08 – 2011-05-09 (×3): 0.4 mg via ORAL
  Filled 2011-05-08 (×5): qty 1

## 2011-05-08 MED ORDER — SODIUM CHLORIDE 0.9 % IV SOLN
250.0000 mL | INTRAVENOUS | Status: DC
  Administered 2011-05-08: 250 mL via INTRAVENOUS

## 2011-05-08 MED ORDER — SENNA 8.6 MG PO TABS
1.0000 | ORAL_TABLET | Freq: Two times a day (BID) | ORAL | Status: DC
  Administered 2011-05-08 – 2011-05-09 (×3): 8.6 mg via ORAL
  Filled 2011-05-08 (×4): qty 1

## 2011-05-08 MED ORDER — DIAZEPAM 5 MG PO TABS
ORAL_TABLET | ORAL | Status: AC
  Administered 2011-05-08: 5 mg
  Filled 2011-05-08: qty 1

## 2011-05-08 MED ORDER — TRAVOPROST (BAK FREE) 0.004 % OP SOLN
1.0000 [drp] | Freq: Every day | OPHTHALMIC | Status: DC
  Administered 2011-05-08: 1 [drp] via OPHTHALMIC
  Filled 2011-05-08: qty 2.5

## 2011-05-08 MED ORDER — ROCURONIUM BROMIDE 100 MG/10ML IV SOLN
INTRAVENOUS | Status: DC | PRN
  Administered 2011-05-08: 50 mg via INTRAVENOUS

## 2011-05-08 MED ORDER — ZOLPIDEM TARTRATE 5 MG PO TABS
5.0000 mg | ORAL_TABLET | Freq: Every evening | ORAL | Status: DC | PRN
  Administered 2011-05-08: 5 mg via ORAL
  Filled 2011-05-08: qty 1

## 2011-05-08 MED ORDER — 0.9 % SODIUM CHLORIDE (POUR BTL) OPTIME
TOPICAL | Status: DC | PRN
  Administered 2011-05-08: 1000 mL

## 2011-05-08 MED ORDER — POLYETHYLENE GLYCOL 3350 17 G PO PACK
17.0000 g | PACK | Freq: Every day | ORAL | Status: DC | PRN
  Filled 2011-05-08: qty 1

## 2011-05-08 MED ORDER — MENTHOL 3 MG MT LOZG: 1.0000 | LOZENGE | OROMUCOSAL | Status: DC | PRN

## 2011-05-08 MED ORDER — GLYCOPYRROLATE 0.2 MG/ML IJ SOLN
INTRAMUSCULAR | Status: DC | PRN
  Administered 2011-05-08: 0.2 mg via INTRAVENOUS
  Administered 2011-05-08: 0.4 mg via INTRAVENOUS

## 2011-05-08 MED ORDER — HYDROMORPHONE HCL PF 1 MG/ML IJ SOLN
0.2500 mg | INTRAMUSCULAR | Status: DC | PRN
  Administered 2011-05-08 (×4): 0.5 mg via INTRAVENOUS

## 2011-05-08 MED ORDER — THROMBIN 20000 UNITS EX KIT
PACK | CUTANEOUS | Status: DC | PRN
  Administered 2011-05-08: 09:00:00 via TOPICAL

## 2011-05-08 MED ORDER — BUPIVACAINE HCL (PF) 0.25 % IJ SOLN
INTRAMUSCULAR | Status: DC | PRN
  Administered 2011-05-08: 30 mL

## 2011-05-08 MED ORDER — NEOSTIGMINE METHYLSULFATE 1 MG/ML IJ SOLN
INTRAMUSCULAR | Status: DC | PRN
  Administered 2011-05-08: 3 mg via INTRAVENOUS
  Administered 2011-05-08: 1 mg via INTRAVENOUS

## 2011-05-08 MED ORDER — PANTOPRAZOLE SODIUM 40 MG PO TBEC
40.0000 mg | DELAYED_RELEASE_TABLET | Freq: Every day | ORAL | Status: DC
  Administered 2011-05-08 – 2011-05-09 (×2): 40 mg via ORAL
  Filled 2011-05-08 (×2): qty 1

## 2011-05-08 MED ORDER — PHENOL 1.4 % MT LIQD
1.0000 | OROMUCOSAL | Status: DC | PRN
  Filled 2011-05-08: qty 177

## 2011-05-08 MED ORDER — PSEUDOEPHEDRINE HCL 30 MG PO TABS
30.0000 mg | ORAL_TABLET | Freq: Four times a day (QID) | ORAL | Status: DC | PRN
  Filled 2011-05-08: qty 1

## 2011-05-08 MED ORDER — ACETAMINOPHEN 650 MG RE SUPP: 650.0000 mg | RECTAL | Status: DC | PRN

## 2011-05-08 MED ORDER — ACETAMINOPHEN 325 MG PO TABS: 650.0000 mg | ORAL_TABLET | ORAL | Status: DC | PRN

## 2011-05-08 MED ORDER — SPIRONOLACTONE 12.5 MG HALF TABLET
12.5000 mg | ORAL_TABLET | Freq: Every morning | ORAL | Status: DC
  Administered 2011-05-08 – 2011-05-09 (×2): 12.5 mg via ORAL
  Filled 2011-05-08 (×2): qty 1

## 2011-05-08 MED ORDER — BACITRACIN 50000 UNITS IM SOLR
INTRAMUSCULAR | Status: AC
  Filled 2011-05-08: qty 1

## 2011-05-08 MED ORDER — ONDANSETRON HCL 4 MG/2ML IJ SOLN
INTRAMUSCULAR | Status: DC | PRN
  Administered 2011-05-08: 4 mg via INTRAVENOUS

## 2011-05-08 MED ORDER — CEFAZOLIN SODIUM 1-5 GM-% IV SOLN
1.0000 g | Freq: Three times a day (TID) | INTRAVENOUS | Status: AC
  Administered 2011-05-08 (×2): 1 g via INTRAVENOUS
  Filled 2011-05-08 (×2): qty 50

## 2011-05-08 MED ORDER — SIMVASTATIN 20 MG PO TABS
20.0000 mg | ORAL_TABLET | Freq: Every evening | ORAL | Status: DC
  Administered 2011-05-08 (×2): 20 mg via ORAL
  Filled 2011-05-08 (×2): qty 1

## 2011-05-08 MED ORDER — BISACODYL 10 MG RE SUPP: 10.0000 mg | Freq: Every day | RECTAL | Status: DC | PRN

## 2011-05-08 MED ORDER — ONDANSETRON HCL 4 MG/2ML IJ SOLN: 4.0000 mg | INTRAMUSCULAR | Status: DC | PRN

## 2011-05-08 MED ORDER — VECURONIUM BROMIDE 10 MG IV SOLR
INTRAVENOUS | Status: DC | PRN
  Administered 2011-05-08 (×3): 2 mg via INTRAVENOUS
  Administered 2011-05-08: 3 mg via INTRAVENOUS
  Administered 2011-05-08: 1 mg via INTRAVENOUS

## 2011-05-08 MED ORDER — SODIUM CHLORIDE 0.9 % IJ SOLN
3.0000 mL | Freq: Two times a day (BID) | INTRAMUSCULAR | Status: DC
  Administered 2011-05-08 – 2011-05-09 (×3): 3 mL via INTRAVENOUS

## 2011-05-08 MED ORDER — SODIUM CHLORIDE 0.9 % IV SOLN
INTRAVENOUS | Status: AC
  Filled 2011-05-08: qty 500

## 2011-05-08 MED ORDER — GUAIFENESIN 200 MG PO TABS
400.0000 mg | ORAL_TABLET | Freq: Four times a day (QID) | ORAL | Status: DC | PRN
  Filled 2011-05-08: qty 2

## 2011-05-08 MED ORDER — METOPROLOL SUCCINATE ER 100 MG PO TB24
100.0000 mg | ORAL_TABLET | Freq: Every evening | ORAL | Status: DC
  Administered 2011-05-08: 100 mg via ORAL
  Filled 2011-05-08 (×2): qty 1

## 2011-05-08 MED ORDER — FENTANYL CITRATE 0.05 MG/ML IJ SOLN
INTRAMUSCULAR | Status: DC | PRN
  Administered 2011-05-08: 100 ug via INTRAVENOUS
  Administered 2011-05-08 (×3): 50 ug via INTRAVENOUS

## 2011-05-08 MED ORDER — DIPHENHYDRAMINE HCL 25 MG PO CAPS
25.0000 mg | ORAL_CAPSULE | Freq: Every day | ORAL | Status: DC
  Filled 2011-05-08: qty 1

## 2011-05-08 MED ORDER — DIAZEPAM 5 MG PO TABS
5.0000 mg | ORAL_TABLET | Freq: Four times a day (QID) | ORAL | Status: DC | PRN
  Administered 2011-05-08 – 2011-05-09 (×3): 5 mg via ORAL
  Filled 2011-05-08 (×3): qty 1

## 2011-05-08 MED ORDER — OXYCODONE-ACETAMINOPHEN 5-325 MG PO TABS
1.0000 | ORAL_TABLET | ORAL | Status: DC | PRN
  Administered 2011-05-08 – 2011-05-09 (×4): 2 via ORAL
  Filled 2011-05-08 (×4): qty 2

## 2011-05-08 MED ORDER — ALUM & MAG HYDROXIDE-SIMETH 200-200-20 MG/5ML PO SUSP: 30.0000 mL | Freq: Four times a day (QID) | ORAL | Status: DC | PRN

## 2011-05-08 MED ORDER — HYDROCHLOROTHIAZIDE 25 MG PO TABS
25.0000 mg | ORAL_TABLET | Freq: Every day | ORAL | Status: DC
  Administered 2011-05-08 – 2011-05-09 (×2): 25 mg via ORAL
  Filled 2011-05-08 (×2): qty 1

## 2011-05-08 MED ORDER — FLEET ENEMA 7-19 GM/118ML RE ENEM
1.0000 | ENEMA | Freq: Once | RECTAL | Status: AC | PRN
  Filled 2011-05-08: qty 1

## 2011-05-08 MED ORDER — HYDROMORPHONE HCL PF 1 MG/ML IJ SOLN
0.5000 mg | INTRAMUSCULAR | Status: DC | PRN
  Administered 2011-05-08: 1 mg via INTRAVENOUS
  Filled 2011-05-08: qty 1

## 2011-05-08 MED ORDER — VITAMIN C 500 MG PO TABS
1000.0000 mg | ORAL_TABLET | Freq: Every day | ORAL | Status: DC
  Administered 2011-05-08 – 2011-05-09 (×2): 1000 mg via ORAL
  Filled 2011-05-08 (×2): qty 2

## 2011-05-08 MED ORDER — SODIUM CHLORIDE 0.9 % IV SOLN
INTRAVENOUS | Status: DC | PRN
  Administered 2011-05-08: 10:00:00 via INTRAVENOUS

## 2011-05-08 MED ORDER — PHENYLEPHRINE HCL 10 MG/ML IJ SOLN
INTRAMUSCULAR | Status: DC | PRN
  Administered 2011-05-08: 100 ug via INTRAVENOUS
  Administered 2011-05-08 (×2): 200 ug via INTRAVENOUS

## 2011-05-08 MED ORDER — LACTATED RINGERS IV SOLN
INTRAVENOUS | Status: DC | PRN
  Administered 2011-05-08 (×3): via INTRAVENOUS

## 2011-05-08 MED ORDER — ADULT MULTIVITAMIN W/MINERALS CH
1.0000 | ORAL_TABLET | Freq: Every day | ORAL | Status: DC
  Administered 2011-05-08 – 2011-05-09 (×2): 1 via ORAL
  Filled 2011-05-08 (×2): qty 1

## 2011-05-08 MED ORDER — EPHEDRINE SULFATE 50 MG/ML IJ SOLN
INTRAMUSCULAR | Status: DC | PRN
  Administered 2011-05-08: 10 mg via INTRAVENOUS
  Administered 2011-05-08 (×2): 5 mg via INTRAVENOUS
  Administered 2011-05-08 (×2): 10 mg via INTRAVENOUS
  Administered 2011-05-08 (×2): 5 mg via INTRAVENOUS

## 2011-05-08 MED ORDER — OMEGA-3-ACID ETHYL ESTERS 1 G PO CAPS
1.0000 g | ORAL_CAPSULE | Freq: Every day | ORAL | Status: DC
  Administered 2011-05-08 – 2011-05-09 (×2): 1 g via ORAL
  Filled 2011-05-08 (×2): qty 1

## 2011-05-08 MED ORDER — HYDROCODONE-ACETAMINOPHEN 5-325 MG PO TABS
1.0000 | ORAL_TABLET | ORAL | Status: DC | PRN
  Administered 2011-05-09: 2 via ORAL
  Filled 2011-05-08: qty 2

## 2011-05-08 MED ORDER — MORPHINE SULFATE 4 MG/ML IJ SOLN: 0.0500 mg/kg | INTRAMUSCULAR | Status: DC | PRN

## 2011-05-08 MED ORDER — FLUTICASONE PROPIONATE 50 MCG/ACT NA SUSP
2.0000 | Freq: Every day | NASAL | Status: DC
  Administered 2011-05-08 – 2011-05-09 (×2): 2 via NASAL
  Filled 2011-05-08: qty 16

## 2011-05-08 MED ORDER — SODIUM CHLORIDE 0.9 % IJ SOLN: 3.0000 mL | INTRAMUSCULAR | Status: DC | PRN

## 2011-05-08 MED ORDER — OMEGA-3 FATTY ACIDS 1000 MG PO CAPS: 1.0000 g | ORAL_CAPSULE | Freq: Every day | ORAL | Status: DC

## 2011-05-08 MED ORDER — DIPHENHYDRAMINE HCL (SLEEP) 25 MG PO TABS: 25.0000 mg | ORAL_TABLET | Freq: Every day | ORAL | Status: DC

## 2011-05-08 SURGICAL SUPPLY — 65 items
ADH SKN CLS APL DERMABOND .7 (GAUZE/BANDAGES/DRESSINGS) ×1
APL SKNCLS STERI-STRIP NONHPOA (GAUZE/BANDAGES/DRESSINGS) ×1
BAG DECANTER FOR FLEXI CONT (MISCELLANEOUS) ×2 IMPLANT
BENZOIN TINCTURE PRP APPL 2/3 (GAUZE/BANDAGES/DRESSINGS) ×2 IMPLANT
BLADE SURG ROTATE 9660 (MISCELLANEOUS) ×1 IMPLANT
BRUSH SCRUB EZ PLAIN DRY (MISCELLANEOUS) ×2 IMPLANT
BUR MATCHSTICK NEURO 3.0 LAGG (BURR) ×2 IMPLANT
CANISTER SUCTION 2500CC (MISCELLANEOUS) ×2 IMPLANT
CAP LCK SPNE (Orthopedic Implant) ×4 IMPLANT
CAP LOCK SPINE RADIUS (Orthopedic Implant) IMPLANT
CAP LOCKING (Orthopedic Implant) ×8 IMPLANT
CLOTH BEACON ORANGE TIMEOUT ST (SAFETY) ×2 IMPLANT
CONT SPEC 4OZ CLIKSEAL STRL BL (MISCELLANEOUS) ×4 IMPLANT
COVER BACK TABLE 24X17X13 BIG (DRAPES) IMPLANT
COVER TABLE BACK 60X90 (DRAPES) ×2 IMPLANT
CROSSLINK VARIABLE M-A (Orthopedic Implant) ×1 IMPLANT
DECANTER SPIKE VIAL GLASS SM (MISCELLANEOUS) ×1 IMPLANT
DERMABOND ADVANCED (GAUZE/BANDAGES/DRESSINGS) ×1
DERMABOND ADVANCED .7 DNX12 (GAUZE/BANDAGES/DRESSINGS) ×1 IMPLANT
DRAPE C-ARM 42X72 X-RAY (DRAPES) ×4 IMPLANT
DRAPE LAPAROTOMY 100X72X124 (DRAPES) ×2 IMPLANT
DRAPE POUCH INSTRU U-SHP 10X18 (DRAPES) ×2 IMPLANT
DRAPE PROXIMA HALF (DRAPES) IMPLANT
DRAPE SURG 17X23 STRL (DRAPES) ×8 IMPLANT
ELECT REM PT RETURN 9FT ADLT (ELECTROSURGICAL) ×2
ELECTRODE REM PT RTRN 9FT ADLT (ELECTROSURGICAL) ×1 IMPLANT
EVACUATOR 1/8 PVC DRAIN (DRAIN) ×2 IMPLANT
GAUZE SPONGE 4X4 16PLY XRAY LF (GAUZE/BANDAGES/DRESSINGS) IMPLANT
GLOVE BIOGEL M 8.0 STRL (GLOVE) ×2 IMPLANT
GLOVE BIOGEL PI IND STRL 7.0 (GLOVE) IMPLANT
GLOVE BIOGEL PI INDICATOR 7.0 (GLOVE) ×1
GLOVE ECLIPSE 8.5 STRL (GLOVE) ×5 IMPLANT
GLOVE EXAM NITRILE LRG STRL (GLOVE) IMPLANT
GLOVE EXAM NITRILE MD LF STRL (GLOVE) ×2 IMPLANT
GLOVE EXAM NITRILE XL STR (GLOVE) IMPLANT
GLOVE EXAM NITRILE XS STR PU (GLOVE) IMPLANT
GLOVE SS BIOGEL STRL SZ 6.5 (GLOVE) IMPLANT
GLOVE SUPERSENSE BIOGEL SZ 6.5 (GLOVE) ×3
GLOVE SURG SS PI 6.5 STRL IVOR (GLOVE) ×1 IMPLANT
GOWN BRE IMP SLV AUR LG STRL (GOWN DISPOSABLE) ×1 IMPLANT
GOWN BRE IMP SLV AUR XL STRL (GOWN DISPOSABLE) ×6 IMPLANT
GOWN STRL REIN 2XL LVL4 (GOWN DISPOSABLE) IMPLANT
KIT BASIN OR (CUSTOM PROCEDURE TRAY) ×2 IMPLANT
KIT ROOM TURNOVER OR (KITS) ×2 IMPLANT
MILL MEDIUM DISP (BLADE) ×1 IMPLANT
NEEDLE HYPO 22GX1.5 SAFETY (NEEDLE) ×2 IMPLANT
NS IRRIG 1000ML POUR BTL (IV SOLUTION) ×2 IMPLANT
PACK LAMINECTOMY NEURO (CUSTOM PROCEDURE TRAY) ×2 IMPLANT
ROD RADIUS 40MM (Neuro Prosthesis/Implant) ×4 IMPLANT
ROD SPNL 40X5.5XNS TI RDS (Neuro Prosthesis/Implant) IMPLANT
SCREW 6.75X45MM (Screw) ×2 IMPLANT
SPONGE GAUZE 4X4 12PLY (GAUZE/BANDAGES/DRESSINGS) ×2 IMPLANT
SPONGE SURGIFOAM ABS GEL 100 (HEMOSTASIS) ×2 IMPLANT
STRIP CLOSURE SKIN 1/2X4 (GAUZE/BANDAGES/DRESSINGS) ×3 IMPLANT
SUT VIC AB 0 CT1 18XCR BRD8 (SUTURE) ×2 IMPLANT
SUT VIC AB 0 CT1 8-18 (SUTURE) ×2
SUT VIC AB 2-0 CT1 18 (SUTURE) ×2 IMPLANT
SUT VIC AB 3-0 SH 8-18 (SUTURE) ×3 IMPLANT
SYR 20ML ECCENTRIC (SYRINGE) ×2 IMPLANT
TELAMON 22X10 (Cage) ×1 IMPLANT
TOWEL OR 17X24 6PK STRL BLUE (TOWEL DISPOSABLE) ×2 IMPLANT
TOWEL OR 17X26 10 PK STRL BLUE (TOWEL DISPOSABLE) ×2 IMPLANT
TRAY FOLEY CATH 14FRSI W/METER (CATHETERS) ×2 IMPLANT
WATER STERILE IRR 1000ML POUR (IV SOLUTION) ×2 IMPLANT
WEDGE TANGENT 10X26MM ×1 IMPLANT

## 2011-05-08 NOTE — Preoperative (Signed)
Beta Blockers   Reason not to administer Beta Blockers:Not Applicable, last dose 05/07/11 @ 18:30

## 2011-05-08 NOTE — Progress Notes (Signed)
UR COMPLETED  

## 2011-05-08 NOTE — Anesthesia Preprocedure Evaluation (Addendum)
Anesthesia Evaluation  Patient identified by MRN, date of birth, ID band Patient awake    Reviewed: Allergy & Precautions, H&P , NPO status , reviewed documented beta blocker date and time   Airway Mallampati: II TM Distance: >3 FB Neck ROM: Full    Dental  (+) Teeth Intact and Dental Advisory Given   Pulmonary neg pulmonary ROS,          Cardiovascular hypertension, Pt. on medications + Valvular Problems/Murmurs Rate:Normal  Patient reports has a history of childhood heart murmur. Asymtomatic followed by [facility]. Perfusion test 2007 reviewed.  CE   Neuro/Psych negative neurological ROS     GI/Hepatic Neg liver ROS, hiatal hernia, GERD-  ,  Endo/Other  negative endocrine ROS  Renal/GU negative Renal ROS     Musculoskeletal negative musculoskeletal ROS (+)   Abdominal   Peds  Hematology negative hematology ROS (+)   Anesthesia Other Findings   Reproductive/Obstetrics                         Anesthesia Physical Anesthesia Plan  ASA: III  Anesthesia Plan: General   Post-op Pain Management:    Induction: Intravenous  Airway Management Planned: Oral ETT  Additional Equipment:   Intra-op Plan:   Post-operative Plan: Extubation in OR  Informed Consent:   Plan Discussed with: CRNA  Anesthesia Plan Comments:         Anesthesia Quick Evaluation

## 2011-05-08 NOTE — Transfer of Care (Signed)
Immediate Anesthesia Transfer of Care Note  Patient: Cody Tate  Procedure(s) Performed: Procedure(s) (LRB): POSTERIOR LUMBAR FUSION 1 WITH HARDWARE REMOVAL (N/A)  Patient Location: PACU  Anesthesia Type: General  Level of Consciousness: awake, alert  and oriented  Airway & Oxygen Therapy: Patient Spontanous Breathing and Patient connected to nasal cannula oxygen  Post-op Assessment: Report given to PACU RN  Post vital signs: Reviewed and stable  Complications: No apparent anesthesia complications

## 2011-05-08 NOTE — Brief Op Note (Signed)
05/08/2011  10:44 AM  PATIENT:  Cody Tate  61 y.o. male  PRE-OPERATIVE DIAGNOSIS:  Lumbar two-three adjacent level stenosis; Staus post Lumbar three to five decompression and fusion  POST-OPERATIVE DIAGNOSIS:  Lumbar two-three adjacent level stenosis; Staus post Lumbar three to five decompression and fusion  PROCEDURE:  Procedure(s) (LRB): POSTERIOR LUMBAR FUSION 1 WITH HARDWARE REMOVAL (N/A)  SURGEON:  Surgeon(s) and Role:    * Temple Pacini, MD - Primary    * Karn Cassis, MD - Assisting  PHYSICIAN ASSISTANT:   ASSISTANTS: Botero   ANESTHESIA:   general  EBL:  Total I/O In: 2450 [I.V.:2100; Blood:350] Out: 1150 [Urine:300; Blood:850]  BLOOD ADMINISTERED:none  DRAINS: (Medium) Hemovact drain(s) in the Epidural space with  Suction Open   LOCAL MEDICATIONS USED:  MARCAINE     SPECIMEN:  No Specimen  DISPOSITION OF SPECIMEN:  N/A  COUNTS:  YES  TOURNIQUET:  * No tourniquets in log *  DICTATION: .Dragon Dictation  PLAN OF CARE: Admit to inpatient   PATIENT DISPOSITION:  PACU - hemodynamically stable.   Delay start of Pharmacological VTE agent (>24hrs) due to surgical blood loss or risk of bleeding: yes

## 2011-05-08 NOTE — Op Note (Signed)
Date of procedure: 05/08/2011  Date of dictation: Same  Service: Neurosurgery  Preoperative diagnosis: L2-3 adjacent level instability with severe stenosis and neurogenic claudication, status post L3-L5 decompression and fusion with instrumentation.  Postoperative diagnosis: Same  Procedure Name: L2-3 redo decompressive laminectomy with bilateral L2 and L3 decompressive foraminotomies, more than would be required for simple interbody fusion alone.  L2-3 posterior lumbar interbody fusion utilizing tangent interbody allograft wedge Telamon interbody peek cage and local autograft.  L2-3 posterior lateral arthrodesis utilizing nonsegmental pedicle screw instrumentation and local autograft.  Reexploration of L3-L5 posterior lumbar fusion with removal of instrumentation.  Surgeon:Marijose Curington A.Hansika Leaming, M.D.  Asst. Surgeon: Jeral Fruit  Anesthesia: General  Indication: 61 year old male status post previous L3-L5 decompression and fusion with good result presents now with worsening back pain and neurogenic claudication. Workup demonstrates evidence of adjacent level disease with significant stenosis. Patient's failed conservative management and presents now for decompression infusion in hopes of improving his symptoms.  Operative note: After induction of anesthesia, patient positioned prone on the Wilson frame and her purply padded. Lumbar region prepped and draped sterilely. Incision made overlying the L2-L5 levels. Supper off Henderson Newcomer performed exposing the lamina and facet joints of L2 and L3 as well as the previously placed pedicle screw station from L3-L5. Pedicle screw sedation was disassembled. The fusion was explored and found to be quite solid. Screws were removed from L4 and L5 and left out. Screws were left in place at L3. Decompressive laminectomy of L2 was then performed using Leksell rongeurs Kerrison there is a high-speed drill to remove the entire lamina of L2 anterior facets of L2 bilaterally  superior facet of L3 bilaterally. Ligament flavum and epidural scar were elevated and resected piecemeal fashion using Kerrison rongeurs. Wide decompressive foraminotomies were performed along the exiting course of the L2 and L3 nerve roots bilaterally. Bilateral discectomies and performed at L2-3. The space and distraction up to 10 mm with a 10 mm distractor less than patient's left side. Thecal sac and nerve respect on the right side. The spaces and reamed and then cut with 10 mm tangent instruments. Soft tissue was removed and interspace. A 10 x 22 mm Telamon cage packed with morselized autograft was then impacted in place and recessed proximally 2 mm in the posterior cortical margin of the L2 body. Distractors in the patient's left side. Thecal sac and nerve respect to the left side. The space was again reamed and then cut with 10 mm tangent his fingers soft tissues removed and interspace. The spaces further curettage. Morselize autograft packed into the interspace for later fusion. A 10 x 26 over tangent wedges and packed into place recessed roughly 2 mm from the posterior cortical margin of L2. Pedicles of L2 were then identified using surface landmarks and intraoperative fluoroscopy 2 partial bone overlying the pedicle was removed using high-speed drill pedicles then probed using pedicle awl each pedicle awl track was then tapped with a 5.52 Mentor screw temperature Olson probed and found to be solidly within bone. 6.75 x 45 mm radius screws placed bilaterally. Transverse processes of L2 and L3 were then decorticated using high-speed drill. Morselized autograft was packed posterolaterally for later fusion. Short segment titanium rod was then placed through the screw heads at L2 and L3. Locking caps and placed over the screws were locking caps and engaged with the construct under compression. Final images revealed good position the breasts or were at the proper upper level with normal alignment of the spine. A  transverse connector  was placed. Gelfoam with postoperative hemostasis. The pneumatic drain was left at per space. Wounds and close in layers with Vicryl sutures. Steri-Strips triggers were applied. There were no apparent complications. Patient tolerated the procedure well and returned to the recovery room postop.

## 2011-05-08 NOTE — Anesthesia Postprocedure Evaluation (Signed)
  Anesthesia Post-op Note  Patient: Cody Tate  Procedure(s) Performed: Procedure(s) (LRB): POSTERIOR LUMBAR FUSION 1 WITH HARDWARE REMOVAL (N/A)  Patient Location: PACU  Anesthesia Type: General  Level of Consciousness: awake  Airway and Oxygen Therapy: Patient Spontanous Breathing  Post-op Pain: mild  Post-op Assessment: Post-op Vital signs reviewed  Post-op Vital Signs: Reviewed  Complications: No apparent anesthesia complications

## 2011-05-08 NOTE — H&P (Signed)
Cody Tate is an 61 y.o. male.   Chief Complaint: Back pain bilateral leg numbness HPI: 61 year old male status post previous L3-L5 decompression infusion presents with worsening back pain bilateral lower extremity numbness paresthesias and weakness consistent with neurogenic claudication. 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 Patient has failed conservative management and presents now for L2-3 decompression infusion in hopes of improving his symptoms.  Past Medical History  Diagnosis Date  . Hypertension   . Heart murmur   . GERD (gastroesophageal reflux disease)   . H/O hiatal hernia   . Arthritis     Past Surgical History  Procedure Date  . Back surgery 06,07,08    lam x3 cerv x 1  . Wrist surgery 09    cyst lft  . Sinus exploration   . Knee arthroscopy 10    lft  . Eye surgery     bil  . Pilonidal cyst excision   . Wrist ganglion excision     rt  . Cystoscopy     61 yrs old    History reviewed. No pertinent family history. Social History:  reports that he quit smoking about 11 years ago. His smoking use included Cigarettes. He smoked 2 packs per day for 0 years. He does not have any smokeless tobacco history on file. He reports that he does not drink alcohol or use illicit drugs.  Allergies:  Allergies  Allergen Reactions  . Other Other (See Comments)    VICRYL Sutures  . Adhesive (Tape) Itching and Rash    Occlusive tape and bandaids    Medications Prior to Admission  Medication Sig Dispense Refill  . diphenhydrAMINE (SOMINEX) 25 MG tablet Take 25 mg by mouth at bedtime.      Marland Kitchen esomeprazole (NEXIUM) 40 MG capsule Take 40 mg by mouth daily before breakfast.      . fish oil-omega-3 fatty acids 1000 MG capsule Take 1 g by mouth daily.      Marland Kitchen guaifenesin (HUMIBID E) 400 MG TABS Take 400 mg by mouth every 6 (six) hours as needed. For mucus      . hydrochlorothiazide (HYDRODIURIL) 25 MG tablet Take 25 mg by mouth daily.        . hydrocodone-acetaminophen (LORCET-HD) 5-500 MG per capsule Take 2 capsules by mouth every 8 (eight) hours as needed. For pain      . ibuprofen (ADVIL,MOTRIN) 200 MG tablet Take 200 mg by mouth every 6 (six) hours as needed. For pain      . magnesium hydroxide (MILK OF MAGNESIA) 400 MG/5ML suspension Take 15 mLs by mouth at bedtime as needed. For constipation      . metoprolol succinate (TOPROL-XL) 100 MG 24 hr tablet Take 100 mg by mouth every evening. Take with or immediately following a meal.      . Multiple Vitamin (MULITIVITAMIN WITH MINERALS) TABS Take 1 tablet by mouth daily.      . pseudoephedrine (SUDAFED) 30 MG tablet Take 30 mg by mouth every 6 (six) hours as needed. For congestion      . ramipril (ALTACE) 10 MG capsule Take 10 mg by mouth every evening.      . Simethicone (GAS-X EXTRA STRENGTH PO) Take 1 tablet by mouth 4 (four) times daily as needed. For upset stomach      . simvastatin (ZOCOR) 20 MG tablet Take 20 mg by mouth every evening.      Marland Kitchen spironolactone (ALDACTONE) 25 MG tablet Take 12.5 mg  by mouth every morning.      . Tamsulosin HCl (FLOMAX) 0.4 MG CAPS Take 0.4 mg by mouth 2 (two) times daily.      . Travoprost, BAK Free, (TRAVATAN) 0.004 % SOLN ophthalmic solution Place 1 drop into both eyes at bedtime.      . Ascorbic Acid (VITAMIN C) 1000 MG tablet Take 1,000 mg by mouth daily.      . fluticasone (FLONASE) 50 MCG/ACT nasal spray Place 2 sprays into both nostrils daily.        No results found for this or any previous visit (from the past 48 hour(s)). No results found.  Review of Systems  Constitutional: Negative.   HENT: Negative.   Eyes: Negative.   Respiratory: Negative.   Cardiovascular: Negative.   Gastrointestinal: Negative.   Genitourinary: Negative.   Musculoskeletal: Negative.   Skin: Negative.   Neurological: Negative.   Endo/Heme/Allergies: Negative.   Psychiatric/Behavioral: Negative.     Blood pressure 124/76, pulse 82, temperature 98.2  F (36.8 C), temperature source Oral, resp. rate 18, SpO2 95.00%. Physical Exam  Constitutional: He is oriented to person, place, and time. He appears well-developed and well-nourished.  HENT:  Head: Normocephalic and atraumatic.  Right Ear: External ear normal.  Left Ear: External ear normal.  Nose: Nose normal.  Mouth/Throat: Oropharynx is clear and moist.  Eyes: Conjunctivae and EOM are normal. Pupils are equal, round, and reactive to light.  Neck: Normal range of motion. Neck supple. No tracheal deviation present. No thyromegaly present.  Cardiovascular: Normal rate, regular rhythm, normal heart sounds and intact distal pulses.   Respiratory: Effort normal and breath sounds normal. No respiratory distress. He has no wheezes.  GI: Bowel sounds are normal. He exhibits no distension. There is no tenderness.  Musculoskeletal: Normal range of motion. He exhibits no edema and no tenderness.  Neurological: He is alert and oriented to person, place, and time. He has normal reflexes. He displays normal reflexes. No cranial nerve deficit. He exhibits normal muscle tone. Coordination normal.  Skin: Skin is warm and dry.  Psychiatric: He has a normal mood and affect. His behavior is normal. Judgment and thought content normal.     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 Plan reexploration of L3-L5 fusion with removal of hardware. L2-3 decompressive laminectomy with posterior lumbar interbody fusion utilizing tangent interbody allograft wedge Telamon interbody peek cage and local autograft coupled with posterior lateral arthrodesis utilizing segmental pedicle screw sedation and local autograft. Risks and benefits have been explained patient wishes to proceed.  Cody Tate A 05/08/2011, 7:22 AM

## 2011-05-09 MED ORDER — DIAZEPAM 5 MG PO TABS: 5.0000 mg | ORAL_TABLET | Freq: Four times a day (QID) | ORAL | Status: AC | PRN

## 2011-05-09 MED ORDER — OXYCODONE-ACETAMINOPHEN 5-325 MG PO TABS
1.0000 | ORAL_TABLET | ORAL | Status: AC | PRN
Start: 2011-05-09 — End: 2011-05-19

## 2011-05-09 NOTE — Plan of Care (Signed)
Problem: Consults Goal: Diagnosis - Spinal Surgery Outcome: Completed/Met Date Met:  05/09/11 Thoraco/Lumbar Spine Fusion

## 2011-05-09 NOTE — Discharge Instructions (Signed)
Wound Care °Keep incision covered and dry for one week.  If you shower prior to then, cover incision with plastic wrap.  °You may remove outer bandage after one week and shower.  °Do not put any creams, lotions, or ointments on incision. °Leave steri-strips on neck.  They will fall off by themselves. °Activity °Walk each and every day, increasing distance each day. °No lifting greater than 5 lbs.  Avoid excessive neck motion. °No driving for 2 weeks; may ride as a passenger locally. °If provided with back brace, wear when out of bed.  It is not necessary to wear brace in bed. °Diet °Resume your normal diet.  °Return to Work °Will be discussed at you follow up appointment. °Call Your Doctor If Any of These Occur °Redness, drainage, or swelling at the wound.  °Temperature greater than 101 degrees. °Severe pain not relieved by pain medication. °Incision starts to come apart. °Follow Up Appt °Call today for appointment in 1-2 weeks ([phone]) or for problems.  If you have any hardware placed in your spine, you will need an x-ray before your appointment. °

## 2011-05-09 NOTE — Discharge Summary (Signed)
Physician Discharge Summary  Patient ID: Cody Tate MRN: [id number] DOB/AGE: November 08, 1950 61 y.o.  Admit date: 05/08/2011 Discharge date: 05/09/2011  Admission Diagnoses:  Discharge Diagnoses:  Principal Problem:  *Lumbar stenosis with neurogenic claudication   Discharged Condition: good  Hospital Course: Patient admitted to the hospital oriented when an conjugate L2-3 decompression future postoperatively he is done very well. His preoperative back and lower extremity pain is resolved. His wound is healing well. Having no other problems. Patient feels ready for discharge home.  Consults:   Significant Diagnostic Studies:   Treatments:   Discharge Exam: Blood pressure 121/60, pulse 75, temperature 98.7 F (37.1 C), temperature source Oral, resp. rate 16, height 5\' 6"  (1.676 m), weight 96.616 kg (213 lb), SpO2 97.00%. Patient is awake and alert oriented and appropriate. Cranial nerve function is intact. Motor and sensory function extremities is normal. Wound is clean dry and intact. Chest and abdomen are benign.  Disposition:    Medication List  As of 05/09/2011 10:31 AM   STOP taking these medications         hydrocodone-acetaminophen 5-500 MG per capsule         TAKE these medications         diazepam 5 MG tablet   Commonly known as: VALIUM   Take 1-2 tablets (5-10 mg total) by mouth every 6 (six) hours as needed.      diphenhydrAMINE 25 MG tablet   Commonly known as: SOMINEX   Take 25 mg by mouth at bedtime.      esomeprazole 40 MG capsule   Commonly known as: NEXIUM   Take 40 mg by mouth daily before breakfast.      fish oil-omega-3 fatty acids 1000 MG capsule   Take 1 g by mouth daily.      fluticasone 50 MCG/ACT nasal spray   Commonly known as: FLONASE   Place 2 sprays into both nostrils daily.      GAS-X EXTRA STRENGTH PO   Take 1 tablet by mouth 4 (four) times daily as needed. For upset stomach      guaifenesin 400 MG Tabs   Commonly known as:  HUMIBID E   Take 400 mg by mouth every 6 (six) hours as needed. For mucus      hydrochlorothiazide 25 MG tablet   Commonly known as: HYDRODIURIL   Take 25 mg by mouth daily.      ibuprofen 200 MG tablet   Commonly known as: ADVIL,MOTRIN   Take 200 mg by mouth every 6 (six) hours as needed. For pain      magnesium hydroxide 400 MG/5ML suspension   Commonly known as: MILK OF MAGNESIA   Take 15 mLs by mouth at bedtime as needed. For constipation      metoprolol succinate 100 MG 24 hr tablet   Commonly known as: TOPROL-XL   Take 100 mg by mouth every evening. Take with or immediately following a meal.      mulitivitamin with minerals Tabs   Take 1 tablet by mouth daily.      oxyCODONE-acetaminophen 5-325 MG per tablet   Commonly known as: PERCOCET   Take 1-2 tablets by mouth every 4 (four) hours as needed.      pseudoephedrine 30 MG tablet   Commonly known as: SUDAFED   Take 30 mg by mouth every 6 (six) hours as needed. For congestion      ramipril 10 MG capsule   Commonly known as: ALTACE   Take  10 mg by mouth every evening.      simvastatin 20 MG tablet   Commonly known as: ZOCOR   Take 20 mg by mouth every evening.      spironolactone 25 MG tablet   Commonly known as: ALDACTONE   Take 12.5 mg by mouth every morning.      Tamsulosin HCl 0.4 MG Caps   Commonly known as: FLOMAX   Take 0.4 mg by mouth 2 (two) times daily.      Travoprost (BAK Free) 0.004 % Soln ophthalmic solution   Commonly known as: TRAVATAN   Place 1 drop into both eyes at bedtime.      vitamin C 1000 MG tablet   Take 1,000 mg by mouth daily.           Follow-up Information    Follow up with Kassadi Presswood A, MD. Call in 1 week.   Contact information:   [address], [address] [place] [address] [phone]          Signed: Temple Pacini 05/09/2011, 10:31 AM

## 2011-05-09 NOTE — Progress Notes (Signed)
OT Discharge Note  Patient is being discharged from OT services secondary to:  *spoke with PT [facility] and no acute OT needs at this time..   OT to sign off   Lucile Shutters   OTR/L Pager: [phone] Office: [phone] .

## 2011-05-09 NOTE — Evaluation (Signed)
Physical Therapy Evaluation Patient Details Name: Cody Tate MRN: [id number] DOB: 1950-10-25 Today's Date: 05/09/2011 Time: 0734-0800 PT Time Calculation (min): 26 min  PT Assessment / Plan / Recommendation Clinical Impression  Pt is 61 y/o male admitted for s/p lumbar fusion L2-3.  Pt moving well and has great support.  No acute PT needs.  PT will sign off.  All education completed and no OT needs.    PT Assessment  Patent does not need any further PT services    Follow Up Recommendations  Outpatient PT    Equipment Recommendations  None recommended by PT    Frequency      Precautions / Restrictions Precautions Precautions: Back Required Braces or Orthoses: Spinal Brace Spinal Brace: Lumbar corset;Applied in sitting position   Pertinent Vitals/Pain 5/10 back.  RN present for premedication.      Mobility  Bed Mobility Bed Mobility: Rolling Right;Right Sidelying to Sit Rolling Right: 6: Modified independent (Device/Increase time);With rail Right Sidelying to Sit: 6: Modified independent (Device/Increase time);With rails Transfers Transfers: Sit to Stand;Stand to Sit Sit to Stand: 4: Min guard;From bed;From elevated surface Stand to Sit: 4: Min guard Details for Transfer Assistance: Minguard for safety with cues for hand placement Ambulation/Gait Ambulation/Gait Assistance: 5: Supervision Ambulation Distance (Feet): 200 Feet Assistive device: None Ambulation/Gait Assistance Details: Pt needed supervision for safety. Gait Pattern: Decreased stride length;Decreased trunk rotation Stairs: Yes Stairs Assistance: 4: Min guard Stair Management Technique: One rail Right;Forwards;Step to pattern Number of Stairs: 3     Exercises     PT Goals    Visit Information  Last PT Received On: 05/09/11 Assistance Needed: +1    Subjective Data  Subjective: "I'm moving much better this morning than last night." Patient Stated Goal: To go home   Prior Functioning  Home  Living Lives With: Spouse Available Help at Discharge: Family Type of Home: House Home Access: Stairs to enter Secretary/administrator of Steps: 3 Entrance Stairs-Rails: Right Home Layout: One level Bathroom Shower/Tub: Walk-in shower;Door [employer] Toilet: Standard Bathroom Accessibility: Yes How Accessible: Accessible via walker Home Adaptive Equipment: Bedside commode/3-in-1;Built-in shower seat;Hand-held shower hose;Grab bars in shower;Walker - rolling Prior Function Level of Independence: Independent Able to Take Stairs?: Yes Driving: Yes Communication Communication: No difficulties Dominant Hand: Right    Cognition  Overall Cognitive Status: Appears within functional limits for tasks assessed/performed Arousal/Alertness: Awake/alert Orientation Level: Appears intact for tasks assessed Behavior During Session: [facility] for tasks performed    Extremity/Trunk Assessment Right Upper Extremity Assessment RUE ROM/Strength/Tone: Within functional levels Left Upper Extremity Assessment LUE ROM/Strength/Tone: Within functional levels Right Lower Extremity Assessment RLE ROM/Strength/Tone: Within functional levels RLE Sensation: Deficits RLE Sensation Deficits: Pt reports mild tingling Left Lower Extremity Assessment LLE ROM/Strength/Tone: Within functional levels LLE Sensation: Deficits LLE Sensation Deficits: Report tingling Trunk Assessment Trunk Assessment: Normal   Balance Balance Balance Assessed: Yes Static Sitting Balance Static Sitting - Balance Support: Feet supported Static Sitting - Level of Assistance: 6: Modified independent (Device/Increase time) Static Sitting - Comment/# of Minutes: ~ 5 minutes to donn brace with min (A).  End of Session PT - End of Session Equipment Utilized During Treatment: Gait belt;Back brace Activity Tolerance: Patient tolerated treatment well Patient left: in chair;with call bell/phone within reach;with family/visitor present Nurse  Communication: Mobility status   Cody Tate 05/09/2011, 8:17 AM Jake Shark, PT DPT [phone]

## 2011-05-11 MED FILL — Heparin Sodium (Porcine) Inj 1000 Unit/ML: INTRAMUSCULAR | Qty: 30 | Status: AC

## 2011-05-11 MED FILL — Sodium Chloride IV Soln 0.9%: INTRAVENOUS | Qty: 1000 | Status: AC

## 2011-05-11 MED FILL — Sodium Chloride Irrigation Soln 0.9%: Qty: 3000 | Status: AC

## 2012-01-02 DIAGNOSIS — D329 Benign neoplasm of meninges, unspecified: Secondary | ICD-10-CM

## 2012-01-02 HISTORY — DX: Benign neoplasm of meninges, unspecified: D32.9

## 2012-05-17 ENCOUNTER — Emergency Department (HOSPITAL_COMMUNITY): Payer: 59

## 2012-05-17 ENCOUNTER — Emergency Department (HOSPITAL_COMMUNITY)
Admission: EM | Admit: 2012-05-17 | Discharge: 2012-05-17 | Disposition: A | Discharge status: Home / Self Care | Payer: 59 | Attending: Emergency Medicine | Admitting: Emergency Medicine

## 2012-05-17 DIAGNOSIS — Y92410 Unspecified street and highway as the place of occurrence of the external cause: Secondary | ICD-10-CM | POA: Insufficient documentation

## 2012-05-17 DIAGNOSIS — Y9389 Activity, other specified: Secondary | ICD-10-CM | POA: Insufficient documentation

## 2012-05-17 DIAGNOSIS — Z8739 Personal history of other diseases of the musculoskeletal system and connective tissue: Secondary | ICD-10-CM | POA: Insufficient documentation

## 2012-05-17 DIAGNOSIS — Z8719 Personal history of other diseases of the digestive system: Secondary | ICD-10-CM | POA: Insufficient documentation

## 2012-05-17 DIAGNOSIS — R011 Cardiac murmur, unspecified: Secondary | ICD-10-CM | POA: Insufficient documentation

## 2012-05-17 DIAGNOSIS — I10 Essential (primary) hypertension: Secondary | ICD-10-CM | POA: Insufficient documentation

## 2012-05-17 DIAGNOSIS — Z87891 Personal history of nicotine dependence: Secondary | ICD-10-CM | POA: Insufficient documentation

## 2012-05-17 DIAGNOSIS — IMO0002 Reserved for concepts with insufficient information to code with codable children: Secondary | ICD-10-CM | POA: Insufficient documentation

## 2012-05-17 DIAGNOSIS — S0101XA Laceration without foreign body of scalp, initial encounter: Secondary | ICD-10-CM

## 2012-05-17 DIAGNOSIS — S0100XA Unspecified open wound of scalp, initial encounter: Secondary | ICD-10-CM | POA: Insufficient documentation

## 2012-05-17 DIAGNOSIS — D329 Benign neoplasm of meninges, unspecified: Secondary | ICD-10-CM

## 2012-05-17 DIAGNOSIS — Z79899 Other long term (current) drug therapy: Secondary | ICD-10-CM | POA: Insufficient documentation

## 2012-05-17 DIAGNOSIS — D320 Benign neoplasm of cerebral meninges: Secondary | ICD-10-CM | POA: Insufficient documentation

## 2012-05-17 DIAGNOSIS — K219 Gastro-esophageal reflux disease without esophagitis: Secondary | ICD-10-CM | POA: Insufficient documentation

## 2012-05-17 MED ORDER — TETANUS-DIPHTH-ACELL PERTUSSIS 5-2.5-18.5 LF-MCG/0.5 IM SUSP
0.5000 mL | Freq: Once | INTRAMUSCULAR | Status: AC
  Administered 2012-05-17: 0.5 mL via INTRAMUSCULAR
  Filled 2012-05-17: qty 0.5

## 2012-05-17 NOTE — ED Provider Notes (Signed)
Medical screening examination/treatment/procedure(s) were performed by non-physician practitioner and as supervising physician I was immediately available for consultation/collaboration.  Sunnie Nielsen, MD 05/17/12 [phone]
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
## 2012-06-03 ENCOUNTER — Other Ambulatory Visit: Payer: Self-pay | Admitting: Neurosurgery

## 2012-06-03 DIAGNOSIS — G9389 Other specified disorders of brain: Secondary | ICD-10-CM

## 2012-06-03 DIAGNOSIS — M5416 Radiculopathy, lumbar region: Secondary | ICD-10-CM

## 2012-06-12 ENCOUNTER — Ambulatory Visit
Admission: RE | Admit: 2012-06-12 | Discharge: 2012-06-12 | Disposition: A | Discharge status: Home / Self Care | Payer: 59 | Source: Ambulatory Visit | Attending: Neurosurgery | Admitting: Neurosurgery

## 2012-06-12 DIAGNOSIS — G9389 Other specified disorders of brain: Secondary | ICD-10-CM

## 2012-06-12 DIAGNOSIS — M5416 Radiculopathy, lumbar region: Secondary | ICD-10-CM

## 2012-06-12 MED ORDER — GADOBENATE DIMEGLUMINE 529 MG/ML IV SOLN
20.0000 mL | Freq: Once | INTRAVENOUS | Status: AC | PRN
  Administered 2012-06-12: 20 mL via INTRAVENOUS

## 2012-06-25 DIAGNOSIS — D320 Benign neoplasm of cerebral meninges: Secondary | ICD-10-CM | POA: Insufficient documentation

## 2012-06-25 DIAGNOSIS — M542 Cervicalgia: Secondary | ICD-10-CM | POA: Insufficient documentation

## 2012-06-25 DIAGNOSIS — M549 Dorsalgia, unspecified: Secondary | ICD-10-CM | POA: Insufficient documentation

## 2012-06-25 DIAGNOSIS — R42 Dizziness and giddiness: Secondary | ICD-10-CM | POA: Insufficient documentation

## 2012-07-11 NOTE — Progress Notes (Signed)
Surgery scheduled for 07/23/12.  Need orders in EPIC.  Thank You.   

## 2012-07-14 ENCOUNTER — Encounter (HOSPITAL_COMMUNITY): Payer: Self-pay | Admitting: Pharmacy Technician

## 2012-07-15 ENCOUNTER — Encounter (HOSPITAL_COMMUNITY): Payer: Self-pay

## 2012-07-15 ENCOUNTER — Encounter (HOSPITAL_COMMUNITY)
Admission: RE | Admit: 2012-07-15 | Discharge: 2012-07-15 | Disposition: A | Discharge status: Home / Self Care | Payer: 59 | Source: Ambulatory Visit | Attending: Orthopedic Surgery | Admitting: Orthopedic Surgery

## 2012-07-15 DIAGNOSIS — Z01812 Encounter for preprocedural laboratory examination: Secondary | ICD-10-CM | POA: Insufficient documentation

## 2012-07-15 HISTORY — DX: Benign neoplasm of meninges, unspecified: D32.9

## 2012-07-15 HISTORY — DX: Personal history of other specified conditions: Z87.898

## 2012-07-15 HISTORY — DX: Benign prostatic hyperplasia without lower urinary tract symptoms: N40.0

## 2012-07-15 LAB — CBC
MCH: 22.4 pg — ABNORMAL LOW (ref 26.0–34.0)
MCHC: 31.5 g/dL (ref 30.0–36.0)
MCV: 71.2 fL — ABNORMAL LOW (ref 78.0–100.0)
Platelets: 379 10*3/uL (ref 150–400)
RDW: 15.6 % — ABNORMAL HIGH (ref 11.5–15.5)

## 2012-07-15 LAB — BASIC METABOLIC PANEL
Calcium: 9.7 mg/dL (ref 8.4–10.5)
Creatinine, Ser: 1.02 mg/dL (ref 0.50–1.35)
GFR calc Af Amer: 89 mL/min — ABNORMAL LOW (ref >90)
GFR calc non Af Amer: 77 mL/min — ABNORMAL LOW (ref >90)

## 2012-07-15 NOTE — Patient Instructions (Addendum)
20 Cody Tate  07/15/2012   Your procedure is scheduled on:  07/23/12 [facility]  Report to [facility] at  0715     AM.  Call this number if you have problems the morning of surgery: [phone]       Remember:   Do not eat food  Or drink :After Midnight. Tuesday NIGHT   Take these medicines the morning of surgery with A SIP OF WATER: Nexium MAY TAKE HYDROCODONE IF NEEDED     .  Contacts, dentures or partial plates can not be worn to surgery  Leave suitcase in the car. After surgery it may be brought to your room.  For patients admitted to the hospital, checkout time is 11:00 AM day of  discharge.             SPECIAL INSTRUCTIONS- SEE [place] PREPARING FOR SURGERY INSTRUCTION SHEET-     DO NOT WEAR JEWELRY, LOTIONS, POWDERS, OR PERFUMES.  WOMEN-- DO NOT SHAVE LEGS OR UNDERARMS FOR 12 HOURS BEFORE SHOWERS. MEN MAY SHAVE FACE.  Patients discharged the day of surgery will not be allowed to drive home. IF going home the day of surgery, you must have a driver and someone to stay with you for the first 24 hours  Name and phone number of your driver:       Wife  [name]  PST [phone]                 FAILURE TO FOLLOW THESE INSTRUCTIONS MAY RESULT IN  CANCELLATION   OF YOUR SURGERY                                                  Patient Signature _____________________________

## 2012-07-15 NOTE — Progress Notes (Signed)
DREW-  Need pre op orders please- HAS PST APPT 07/15/12  thanks

## 2012-07-15 NOTE — Progress Notes (Signed)
Chest x ray, EKG 2/14 received results from patient at PST visit.  Faxed abnormal BMET to Dr Lequita Halt through [facility]

## 2012-07-17 ENCOUNTER — Other Ambulatory Visit: Payer: Self-pay | Admitting: Orthopedic Surgery

## 2012-07-17 MED ORDER — DEXAMETHASONE SODIUM PHOSPHATE 10 MG/ML IJ SOLN: 10.0000 mg | Freq: Once | INTRAMUSCULAR | Status: DC

## 2012-07-17 NOTE — Progress Notes (Signed)
Clearance Dr Jordan Likes on chart

## 2012-07-17 NOTE — Progress Notes (Signed)
NEED PRE OP ORDERS PLEASE  THANKS 

## 2012-07-20 ENCOUNTER — Other Ambulatory Visit: Payer: Self-pay | Admitting: Orthopedic Surgery

## 2012-07-22 DIAGNOSIS — S83249A Other tear of medial meniscus, current injury, unspecified knee, initial encounter: Secondary | ICD-10-CM

## 2012-07-22 NOTE — H&P (Signed)
CC- Cody Tate is a 62 y.o. male who presents with left knee pain.  HPI- . Knee Pain: Patient presents with knee pain involving the  left knee. Onset of the symptoms was several months ago. Inciting event: none known. Current symptoms include giving out, pain located medially, stiffness and swelling. Pain is aggravated by kneeling, pivoting, rising after sitting, squatting and walking.  Patient has had no prior knee problems. Evaluation to date: MRI: abnormal medial meniscal tear and edial osteochondral defect. Treatment to date: rest.  Past Medical History  Diagnosis Date  . Hypertension   . Heart murmur   . GERD (gastroesophageal reflux disease)   . H/O hiatal hernia   . Arthritis   . BPH (benign prostatic hyperplasia)   . H/O wheezing     occ inhaler usage  . Meningioma     Past Surgical History  Procedure Laterality Date  . Back surgery  06,07,08    lam x3 cerv x 1  . Wrist surgery  09    cyst lft  . Sinus exploration    . Knee arthroscopy  10    lft  . Eye surgery      bil  . Pilonidal cyst excision    . Wrist ganglion excision      rt  . Cystoscopy      62 yrs old    Prior to Admission medications   Medication Sig Start Date End Date Taking? Authorizing Provider  diphenhydrAMINE (SOMINEX) 25 MG tablet Take 25 mg by mouth at bedtime as needed for sleep.     Historical Provider, MD  esomeprazole (NEXIUM) 40 MG capsule Take 40 mg by mouth daily before breakfast.    Historical Provider, MD  fluticasone (FLONASE) 50 MCG/ACT nasal spray Place 2 sprays into both nostrils at bedtime.     Historical Provider, MD  guaifenesin (HUMIBID E) 400 MG TABS Take 400 mg by mouth every 6 (six) hours as needed (for congestion). For mucus    Historical Provider, MD  hydrochlorothiazide (HYDRODIURIL) 25 MG tablet Take 25 mg by mouth every morning.     Historical Provider, MD  HYDROcodone-acetaminophen (NORCO/VICODIN) 5-325 MG per tablet Take 2 tablets by mouth 3 (three) times daily as  needed for pain.     Historical Provider, MD  ibuprofen (ADVIL,MOTRIN) 200 MG tablet Take 600 mg by mouth every 6 (six) hours as needed for pain. For pain    Historical Provider, MD  magnesium hydroxide (MILK OF MAGNESIA) 400 MG/5ML suspension Take 15 mLs by mouth at bedtime as needed for constipation. For constipation    Historical Provider, MD  metoprolol succinate (TOPROL-XL) 100 MG 24 hr tablet Take 100 mg by mouth every evening. Take with or immediately following a meal.    Historical Provider, MD  pseudoephedrine (SUDAFED) 30 MG tablet Take 30 mg by mouth every 6 (six) hours as needed. For congestion    Historical Provider, MD  ramipril (ALTACE) 10 MG capsule Take 10 mg by mouth every morning.     Historical Provider, MD  Simethicone (GAS-X EXTRA STRENGTH PO) Take 1 tablet by mouth 4 (four) times daily as needed (for gas). For upset stomach    Historical Provider, MD  simvastatin (ZOCOR) 20 MG tablet Take 20 mg by mouth every evening.    Historical Provider, MD  spironolactone (ALDACTONE) 25 MG tablet Take 12.5 mg by mouth every evening.     Historical Provider, MD  Tamsulosin HCl (FLOMAX) 0.4 MG CAPS Take 0.4 mg  by mouth 2 (two) times daily.    Historical Provider, MD  Travoprost, BAK Free, (TRAVATAN) 0.004 % SOLN ophthalmic solution Place 1 drop into both eyes at bedtime.    Historical Provider, MD   KNEE EXAM antalgic gait, soft tissue tenderness over medial joint line, no effusion, collateral ligaments intact, negative Lachman sign  Physical Examination: General appearance - alert, well appearing, and in no distress Mental status - alert, oriented to person, place, and time Chest - clear to auscultation, no wheezes, rales or rhonchi, symmetric air entry Heart - normal rate, regular rhythm, normal S1, S2, no murmurs, rubs, clicks or gallops Abdomen - soft, nontender, nondistended, no masses or organomegaly Neurological - alert, oriented, normal speech, no focal findings or movement  disorder noted   Asessment/Plan--- Left knee medial meniscal tear- - Plan left knee arthroscopy with meniscal debridement. Procedure risks and potential comps discussed with patient who elects to proceed. Goals are decreased pain and increased function with a high likelihood of achieving both

## 2012-07-23 ENCOUNTER — Ambulatory Visit (HOSPITAL_COMMUNITY): Payer: 59 | Admitting: Anesthesiology

## 2012-07-23 ENCOUNTER — Ambulatory Visit (HOSPITAL_COMMUNITY)
Admission: RE | Admit: 2012-07-23 | Discharge: 2012-07-23 | Disposition: A | Discharge status: Home / Self Care | Payer: 59 | Source: Ambulatory Visit | Attending: Orthopedic Surgery | Admitting: Orthopedic Surgery

## 2012-07-23 ENCOUNTER — Encounter (HOSPITAL_COMMUNITY)
Admission: RE | Disposition: A | Discharge status: Home / Self Care | Payer: Self-pay | Source: Ambulatory Visit | Attending: Orthopedic Surgery

## 2012-07-23 ENCOUNTER — Encounter (HOSPITAL_COMMUNITY): Payer: Self-pay | Admitting: Anesthesiology

## 2012-07-23 ENCOUNTER — Encounter (HOSPITAL_COMMUNITY): Payer: Self-pay | Admitting: *Deleted

## 2012-07-23 DIAGNOSIS — R011 Cardiac murmur, unspecified: Secondary | ICD-10-CM | POA: Insufficient documentation

## 2012-07-23 DIAGNOSIS — D320 Benign neoplasm of cerebral meninges: Secondary | ICD-10-CM | POA: Insufficient documentation

## 2012-07-23 DIAGNOSIS — N400 Enlarged prostate without lower urinary tract symptoms: Secondary | ICD-10-CM | POA: Insufficient documentation

## 2012-07-23 DIAGNOSIS — M959 Acquired deformity of musculoskeletal system, unspecified: Secondary | ICD-10-CM | POA: Insufficient documentation

## 2012-07-23 DIAGNOSIS — K219 Gastro-esophageal reflux disease without esophagitis: Secondary | ICD-10-CM | POA: Insufficient documentation

## 2012-07-23 DIAGNOSIS — S83242A Other tear of medial meniscus, current injury, left knee, initial encounter: Secondary | ICD-10-CM

## 2012-07-23 DIAGNOSIS — M129 Arthropathy, unspecified: Secondary | ICD-10-CM | POA: Insufficient documentation

## 2012-07-23 DIAGNOSIS — Z79899 Other long term (current) drug therapy: Secondary | ICD-10-CM | POA: Insufficient documentation

## 2012-07-23 DIAGNOSIS — M23305 Other meniscus derangements, unspecified medial meniscus, unspecified knee: Secondary | ICD-10-CM | POA: Insufficient documentation

## 2012-07-23 DIAGNOSIS — S83249A Other tear of medial meniscus, current injury, unspecified knee, initial encounter: Secondary | ICD-10-CM

## 2012-07-23 DIAGNOSIS — I10 Essential (primary) hypertension: Secondary | ICD-10-CM | POA: Insufficient documentation

## 2012-07-23 HISTORY — PX: KNEE ARTHROSCOPY: SHX127

## 2012-07-23 SURGERY — ARTHROSCOPY, KNEE
Anesthesia: General | Site: Knee | Laterality: Left | Wound class: Clean

## 2012-07-23 MED ORDER — CHLORHEXIDINE GLUCONATE 4 % EX LIQD
60.0000 mL | Freq: Once | CUTANEOUS | Status: DC
  Filled 2012-07-23: qty 60

## 2012-07-23 MED ORDER — LACTATED RINGERS IV SOLN: INTRAVENOUS | Status: DC | PRN

## 2012-07-23 MED ORDER — ACETAMINOPHEN 10 MG/ML IV SOLN
INTRAVENOUS | Status: DC | PRN
  Administered 2012-07-23: 1000 mg via INTRAVENOUS

## 2012-07-23 MED ORDER — FENTANYL CITRATE 0.05 MG/ML IJ SOLN
INTRAMUSCULAR | Status: DC | PRN
  Administered 2012-07-23: 100 ug via INTRAVENOUS

## 2012-07-23 MED ORDER — METHOCARBAMOL 500 MG PO TABS: 500.0000 mg | ORAL_TABLET | Freq: Four times a day (QID) | ORAL | Status: DC

## 2012-07-23 MED ORDER — OXYCODONE HCL 5 MG PO TABS
5.0000 mg | ORAL_TABLET | ORAL | Status: DC | PRN
  Administered 2012-07-23: 5 mg via ORAL
  Filled 2012-07-23: qty 1

## 2012-07-23 MED ORDER — BUPIVACAINE-EPINEPHRINE PF 0.25-1:200000 % IJ SOLN
INTRAMUSCULAR | Status: AC
  Filled 2012-07-23: qty 30

## 2012-07-23 MED ORDER — BUPIVACAINE-EPINEPHRINE 0.25% -1:200000 IJ SOLN
INTRAMUSCULAR | Status: DC | PRN
  Administered 2012-07-23: 20 mL

## 2012-07-23 MED ORDER — CEFAZOLIN SODIUM-DEXTROSE 2-3 GM-% IV SOLR
INTRAVENOUS | Status: AC
  Filled 2012-07-23: qty 50

## 2012-07-23 MED ORDER — ONDANSETRON HCL 4 MG/2ML IJ SOLN
INTRAMUSCULAR | Status: DC | PRN
  Administered 2012-07-23: 4 mg via INTRAVENOUS

## 2012-07-23 MED ORDER — FENTANYL CITRATE 0.05 MG/ML IJ SOLN
INTRAMUSCULAR | Status: AC
  Filled 2012-07-23: qty 2

## 2012-07-23 MED ORDER — LIDOCAINE HCL (CARDIAC) 10 MG/ML IV SOLN
INTRAVENOUS | Status: DC | PRN
  Administered 2012-07-23: 100 mg via INTRAVENOUS

## 2012-07-23 MED ORDER — MIDAZOLAM HCL 5 MG/5ML IJ SOLN
INTRAMUSCULAR | Status: DC | PRN
  Administered 2012-07-23: 2 mg via INTRAVENOUS

## 2012-07-23 MED ORDER — CEFAZOLIN SODIUM-DEXTROSE 2-3 GM-% IV SOLR
2.0000 g | INTRAVENOUS | Status: AC
  Administered 2012-07-23: 2 g via INTRAVENOUS

## 2012-07-23 MED ORDER — PROPOFOL 10 MG/ML IV BOLUS
INTRAVENOUS | Status: DC | PRN
  Administered 2012-07-23: 200 mg via INTRAVENOUS

## 2012-07-23 MED ORDER — LACTATED RINGERS IR SOLN
Status: DC | PRN
  Administered 2012-07-23: 6000 mL

## 2012-07-23 MED ORDER — SODIUM CHLORIDE 0.9 % IV SOLN: INTRAVENOUS | Status: DC

## 2012-07-23 MED ORDER — OXYCODONE HCL 5 MG PO TABS: 5.0000 mg | ORAL_TABLET | ORAL | Status: DC | PRN

## 2012-07-23 MED ORDER — LACTATED RINGERS IV SOLN
INTRAVENOUS | Status: DC | PRN
  Administered 2012-07-23 (×2): via INTRAVENOUS

## 2012-07-23 MED ORDER — FENTANYL CITRATE 0.05 MG/ML IJ SOLN
25.0000 ug | INTRAMUSCULAR | Status: DC | PRN
  Administered 2012-07-23: 50 ug via INTRAVENOUS

## 2012-07-23 MED ORDER — PROMETHAZINE HCL 25 MG/ML IJ SOLN: 6.2500 mg | INTRAMUSCULAR | Status: DC | PRN

## 2012-07-23 MED ORDER — LACTATED RINGERS IV SOLN: INTRAVENOUS | Status: DC

## 2012-07-23 MED ORDER — ACETAMINOPHEN 10 MG/ML IV SOLN
1000.0000 mg | Freq: Once | INTRAVENOUS | Status: DC
  Filled 2012-07-23: qty 100

## 2012-07-23 SURGICAL SUPPLY — 25 items
BANDAGE ELASTIC 6 VELCRO ST LF (GAUZE/BANDAGES/DRESSINGS) ×1 IMPLANT
BLADE 4.2CUDA (BLADE) ×2 IMPLANT
CLOTH BEACON ORANGE TIMEOUT ST (SAFETY) ×2 IMPLANT
CUFF TOURN SGL QUICK 34 (TOURNIQUET CUFF) ×2
CUFF TRNQT CYL 34X4X40X1 (TOURNIQUET CUFF) ×1 IMPLANT
DRAPE U-SHAPE 47X51 STRL (DRAPES) ×2 IMPLANT
DRSG EMULSION OIL 3X3 NADH (GAUZE/BANDAGES/DRESSINGS) ×2 IMPLANT
DRSG PAD ABDOMINAL 8X10 ST (GAUZE/BANDAGES/DRESSINGS) ×1 IMPLANT
DURAPREP 26ML APPLICATOR (WOUND CARE) ×2 IMPLANT
GLOVE BIO SURGEON STRL SZ8 (GLOVE) ×2 IMPLANT
GLOVE BIOGEL PI IND STRL 8 (GLOVE) ×1 IMPLANT
GLOVE BIOGEL PI INDICATOR 8 (GLOVE) ×1
GOWN STRL NON-REIN LRG LVL3 (GOWN DISPOSABLE) ×2 IMPLANT
MANIFOLD NEPTUNE II (INSTRUMENTS) ×4 IMPLANT
PACK ARTHROSCOPY WL (CUSTOM PROCEDURE TRAY) ×2 IMPLANT
PACK ICE MAXI GEL EZY WRAP (MISCELLANEOUS) ×6 IMPLANT
PADDING CAST COTTON 6X4 STRL (CAST SUPPLIES) ×3 IMPLANT
POSITIONER SURGICAL ARM (MISCELLANEOUS) ×2 IMPLANT
SET ARTHROSCOPY TUBING (MISCELLANEOUS) ×2
SET ARTHROSCOPY TUBING LN (MISCELLANEOUS) ×1 IMPLANT
SPONGE GAUZE 4X4 12PLY (GAUZE/BANDAGES/DRESSINGS) ×1 IMPLANT
SUT ETHILON 4 0 PS 2 18 (SUTURE) ×2 IMPLANT
TOWEL OR 17X26 10 PK STRL BLUE (TOWEL DISPOSABLE) ×2 IMPLANT
WAND 90 DEG TURBOVAC W/CORD (SURGICAL WAND) ×2 IMPLANT
WRAP KNEE MAXI GEL POST OP (GAUZE/BANDAGES/DRESSINGS) ×4 IMPLANT

## 2012-07-23 NOTE — Anesthesia Preprocedure Evaluation (Addendum)
Anesthesia Evaluation  Patient identified by MRN, date of birth, ID band Patient awake  General Assessment Comment:Past Medical History   Diagnosis  Date   .  Hypertension     .  Heart murmur     .  GERD (gastroesophageal reflux disease)     .  H/O hiatal hernia     .  Arthritis     .  BPH (benign prostatic hyperplasia)     .  H/O wheezing         occ inhaler usage   .  Meningioma     Reviewed: Allergy & Precautions, H&P , NPO status , Patient's Chart, lab work & pertinent test results  Airway Mallampati: II TM Distance: >3 FB Neck ROM: Full    Dental  (+) Dental Advisory Given Temporary crown left upper side, with permanent cement placed yesterday.:   Pulmonary neg pulmonary ROS,  breath sounds clear to auscultation  Pulmonary exam normal       Cardiovascular Exercise Tolerance: Good hypertension, Pt. on medications and Pt. on home beta blockers negative cardio ROS  + Valvular Problems/Murmurs Rhythm:Regular Rate:Normal     Neuro/Psych negative neurological ROS  negative psych ROS   GI/Hepatic Neg liver ROS, hiatal hernia, GERD-  Medicated,  Endo/Other  negative endocrine ROS  Renal/GU negative Renal ROS  negative genitourinary   Musculoskeletal negative musculoskeletal ROS (+)   Abdominal   Peds negative pediatric ROS (+)  Hematology negative hematology ROS (+)   Anesthesia Other Findings   Reproductive/Obstetrics negative OB ROS                         Anesthesia Physical Anesthesia Plan  ASA: II  Anesthesia Plan: General   Post-op Pain Management:    Induction: Intravenous  Airway Management Planned: LMA  Additional Equipment:   Intra-op Plan:   Post-operative Plan: Extubation in OR  Informed Consent: I have reviewed the patients History and Physical, chart, labs and discussed the procedure including the risks, benefits and alternatives for the proposed anesthesia  with the patient or authorized representative who has indicated his/her understanding and acceptance.   Dental advisory given  Plan Discussed with: CRNA  Anesthesia Plan Comments:         Anesthesia Quick Evaluation

## 2012-07-23 NOTE — Interval H&P Note (Signed)
History and Physical Interval Note:  07/23/2012 8:49 AM  Cody Tate  has presented today for surgery, with the diagnosis of LEFT KNEE MENISCUS TEAR/CHONDRAL DEFECT  The various methods of treatment have been discussed with the patient and family. After consideration of risks, benefits and other options for treatment, the patient has consented to  Procedure(s): LEFT KNEE ARTHROSCOPY WITH DEBRIDEMENT (Left) as a surgical intervention .  The patient's history has been reviewed, patient examined, no change in status, stable for surgery.  I have reviewed the patient's chart and labs.  Questions were answered to the patient's satisfaction.     Loanne Drilling

## 2012-07-23 NOTE — Transfer of Care (Signed)
Immediate Anesthesia Transfer of Care Note  Patient: Cody Tate  Procedure(s) Performed: Procedure(s): LEFT MEDIAL MENISCAL DEBRIDEMENT AND CHONDROPLASTY (Left)  Patient Location: PACU  Anesthesia Type:General  Level of Consciousness: awake, alert , oriented and patient cooperative  Airway & Oxygen Therapy: Patient Spontanous Breathing and Patient connected to face mask oxygen  Post-op Assessment: Report given to PACU RN, Post -op Vital signs reviewed and stable and Patient moving all extremities X 4  Post vital signs: stable  Complications: No apparent anesthesia complications

## 2012-07-23 NOTE — Anesthesia Postprocedure Evaluation (Signed)
  Anesthesia Post-op Note  Patient: Cody Tate  Procedure(s) Performed: Procedure(s) (LRB): LEFT MEDIAL MENISCAL DEBRIDEMENT AND CHONDROPLASTY (Left)  Patient Location: PACU  Anesthesia Type: General  Level of Consciousness: awake and alert   Airway and Oxygen Therapy: Patient Spontanous Breathing  Post-op Pain: mild  Post-op Assessment: Post-op Vital signs reviewed, Patient's Cardiovascular Status Stable, Respiratory Function Stable, Patent Airway and No signs of Nausea or vomiting  Last Vitals:  Filed Vitals:   07/23/12 1030  BP: 126/76  Pulse: 70  Temp:   Resp: 16    Post-op Vital Signs: stable   Complications: No apparent anesthesia complications

## 2012-07-23 NOTE — Op Note (Signed)
Preoperative diagnosis-  Left knee medial meniscal tear  Postoperative diagnosis Left- knee medial meniscal tear   Plus Left medial femoral chondral defect  Procedure- Left knee arthroscopy with medial  Meniscal debridement and chondroplasty  Surgeon- Gus Rankin. Christie Copley, MD  Anesthesia-General  EBL-  minimal Complications- None  Condition- PACU - hemodynamically stable.  Brief clinical note- -Cody Tate is a 62 y.o.  male with a greater than one year  history of left knee pain and mechanical symptoms. Exam and history suggested medial meniscal tear confirmed by MRI. The patient presents now for arthroscopy and debridement  Procedure in detail -       After successful administration of General anesthetic, a tourmiquet is placed high on the Left  thigh and the Left lower extremity is prepped and draped in the usual sterile fashion. Time out is performed by the surgical team. Standard superomedial and inferolateral portal sites are marked and incisions made with an 11 blade. The inflow cannula is passed through the superomedial portal and camera through the inferolateral portal and inflow is initiated. Arthroscopic visualization proceeds.      The undersurface of the patella and trochlea are visualized and normal with minimal chondromlacia. The medial and lateral gutters are visualized and there are  no loose bodies. Flexion and valgus force is applied to the knee and the medial compartment is entered. A spinal needle is passed into the joint through the site marked for the inferomedial portal. A small incision is made and the dilator passed into the joint. The findings for the medial compartment are tear at body and posterior horn of medial meniscus which is unstable and large 2 x 2 cm area of exposed bone and chondral defect medial femoral condyle . The tear is debrided to a stable base with baskets and a shaver and sealed off with the Arthrocare. The shaver is used to debride the unstable  cartilage to a stable bony base with stable cartilaginous edges. It is probed and found to be stable. The bone is then abraded with the shaver.    The intercondylar notch is visualized and the ACL appears attenuated. The lateral compartment is entered and the findings are normal .      The joint is again inspected and there are no other tears, defects or loose bodies identified. The arthroscopic equipment is then removed from the inferior portals which are closed with interrupted 4-0 nylon. 20 ml of .25% Marcaine with epinephrine are injected through the inflow cannula and the cannula is then removed and the portal closed with nylon. The incisions are cleaned and dried and a bulky sterile dressing is applied. The patient is then awakened and transported to recovery in stable condition.   07/23/2012, 9:52 AM

## 2012-07-24 ENCOUNTER — Encounter (HOSPITAL_COMMUNITY): Payer: Self-pay | Admitting: Orthopedic Surgery

## 2012-10-09 ENCOUNTER — Other Ambulatory Visit: Payer: Self-pay | Admitting: Radiation Therapy

## 2012-10-09 DIAGNOSIS — D329 Benign neoplasm of meninges, unspecified: Secondary | ICD-10-CM

## 2012-10-23 DIAGNOSIS — M47817 Spondylosis without myelopathy or radiculopathy, lumbosacral region: Secondary | ICD-10-CM | POA: Insufficient documentation

## 2012-10-28 ENCOUNTER — Encounter: Payer: Self-pay | Admitting: Radiation Oncology

## 2012-10-28 DIAGNOSIS — D320 Benign neoplasm of cerebral meninges: Secondary | ICD-10-CM | POA: Insufficient documentation

## 2012-10-28 DIAGNOSIS — D329 Benign neoplasm of meninges, unspecified: Secondary | ICD-10-CM
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
## 2012-10-29 ENCOUNTER — Ambulatory Visit
Admission: RE | Admit: 2012-10-29 | Discharge: 2012-10-29 | Disposition: A | Discharge status: Home / Self Care | Payer: 59 | Source: Ambulatory Visit | Attending: Radiation Oncology | Admitting: Radiation Oncology

## 2012-10-29 ENCOUNTER — Encounter: Payer: Self-pay | Admitting: Radiation Oncology

## 2012-10-29 VITALS — BP 149/77 | HR 87 | Temp 98.6°F | Resp 16 | Ht 66.0 in | Wt 223.3 lb

## 2012-10-29 DIAGNOSIS — D329 Benign neoplasm of meninges, unspecified: Secondary | ICD-10-CM

## 2012-10-29 DIAGNOSIS — D320 Benign neoplasm of cerebral meninges: Secondary | ICD-10-CM | POA: Insufficient documentation

## 2012-10-29 LAB — BUN AND CREATININE (CC13): BUN: 27.8 mg/dL — ABNORMAL HIGH (ref 7.0–26.0)

## 2012-10-29 MED ORDER — ALPRAZOLAM 0.25 MG PO TABS: 0.5000 mg | ORAL_TABLET | Freq: Three times a day (TID) | ORAL | Status: DC | PRN

## 2012-10-29 NOTE — Progress Notes (Signed)
Patient hard of hearing. Denies nausea or vomiting. Reports diplopia when he attempts to focus up close. Reports ringing in the ears. Reports diminished sense of smell.Reports low back and hip pain 2 on a scale of 0-10. Reports difficulty sleeping due to low back pain and inability to get comfortable. Reports numbness and tinging in extremities. Reports weakness in legs relate to L5 and S1. Denies headaches. Denies seizure activity. Mostly concerned about pain in back associated with laying on the table and wearing a mask.

## 2012-10-29 NOTE — Progress Notes (Signed)
Complete PATIENT MEASURE OF DISTRESS worksheet with a score of 1 submitted to social work. 

## 2012-10-29 NOTE — Progress Notes (Signed)
See progress note under physician encounter. 
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
## 2012-10-29 NOTE — Addendum Note (Signed)
Encounter addended by: Agnes Lawrence, RN on: 10/29/2012  3:30 PM<BR>     Documentation filed: Charges VN, Notes Section

## 2012-10-29 NOTE — Addendum Note (Signed)
Encounter addended by: Agnes Lawrence, RN on: 10/29/2012  3:29 PM<BR>     Documentation filed: Inpatient Patient Education, Inpatient Document Flowsheet, Charges VN

## 2012-10-30 DIAGNOSIS — M48061 Spinal stenosis, lumbar region without neurogenic claudication: Secondary | ICD-10-CM | POA: Insufficient documentation

## 2012-11-06 ENCOUNTER — Other Ambulatory Visit: Payer: Self-pay

## 2012-11-07 ENCOUNTER — Telehealth: Payer: Self-pay | Admitting: Radiation Oncology

## 2012-11-07 ENCOUNTER — Ambulatory Visit
Admission: RE | Admit: 2012-11-07 | Discharge: 2012-11-07 | Disposition: A | Discharge status: Home / Self Care | Payer: 59 | Source: Ambulatory Visit | Attending: Radiation Oncology | Admitting: Radiation Oncology

## 2012-11-07 DIAGNOSIS — D329 Benign neoplasm of meninges, unspecified: Secondary | ICD-10-CM

## 2012-11-07 MED ORDER — GADOBENATE DIMEGLUMINE 529 MG/ML IV SOLN
20.0000 mL | Freq: Once | INTRAVENOUS | Status: AC | PRN
  Administered 2012-11-07: 20 mL via INTRAVENOUS

## 2012-11-07 NOTE — Telephone Encounter (Signed)
Received call from Dr. Benard Rink after reviewing patient's MRI scan. He reports that he has seen no growth or change of meningioma since June. Relay this information to Dr. Kathrynn Running.

## 2012-11-08 NOTE — Telephone Encounter (Signed)
Thanks Sam, This patient will be presented and evaluated in the brain conference/clinic on 11/12

## 2012-11-11 ENCOUNTER — Other Ambulatory Visit: Payer: 59

## 2012-11-12 ENCOUNTER — Ambulatory Visit: Payer: 59 | Admitting: Radiation Oncology

## 2012-11-12 ENCOUNTER — Ambulatory Visit
Admission: RE | Admit: 2012-11-12 | Discharge: 2012-11-12 | Disposition: A | Discharge status: Home / Self Care | Payer: 59 | Source: Ambulatory Visit | Attending: Radiation Oncology | Admitting: Radiation Oncology

## 2012-11-12 VITALS — BP 121/58 | HR 87 | Temp 98.6°F | Resp 18 | Ht 66.0 in | Wt 218.0 lb

## 2012-11-12 DIAGNOSIS — Z510 Encounter for antineoplastic radiation therapy: Secondary | ICD-10-CM | POA: Insufficient documentation

## 2012-11-12 DIAGNOSIS — C700 Malignant neoplasm of cerebral meninges: Secondary | ICD-10-CM | POA: Insufficient documentation

## 2012-11-12 DIAGNOSIS — D329 Benign neoplasm of meninges, unspecified: Secondary | ICD-10-CM

## 2012-11-12 DIAGNOSIS — C7931 Secondary malignant neoplasm of brain: Secondary | ICD-10-CM | POA: Insufficient documentation

## 2012-11-12 NOTE — Progress Notes (Addendum)
Patient alert and oriented to person, place, and time. Slow steady gait noted with assistance of cane. Here with brother in law, Barnum. Vitals stable. Denies pain. Denies hx of radiation therapy. Denies having a pacemaker. Per Dr. Broadus John order no IV will be start and no simulation will take place.

## 2012-11-17 ENCOUNTER — Other Ambulatory Visit: Payer: Self-pay | Admitting: Radiation Therapy

## 2012-11-17 DIAGNOSIS — D320 Benign neoplasm of cerebral meninges: Secondary | ICD-10-CM

## 2012-11-20 ENCOUNTER — Ambulatory Visit: Payer: 59 | Admitting: Radiation Oncology

## 2012-12-03 DIAGNOSIS — M5417 Radiculopathy, lumbosacral region: Secondary | ICD-10-CM | POA: Insufficient documentation

## 2013-01-07 ENCOUNTER — Other Ambulatory Visit: Payer: Self-pay | Admitting: Radiation Therapy

## 2013-01-07 DIAGNOSIS — D320 Benign neoplasm of cerebral meninges: Secondary | ICD-10-CM

## 2013-01-13 ENCOUNTER — Ambulatory Visit
Admission: RE | Admit: 2013-01-13 | Discharge: 2013-01-13 | Disposition: A | Discharge status: Home / Self Care | Payer: 59 | Source: Ambulatory Visit | Attending: Geriatric Medicine | Admitting: Geriatric Medicine

## 2013-01-13 ENCOUNTER — Other Ambulatory Visit: Payer: Self-pay | Admitting: Geriatric Medicine

## 2013-01-13 DIAGNOSIS — J019 Acute sinusitis, unspecified: Secondary | ICD-10-CM

## 2013-01-27 ENCOUNTER — Encounter: Payer: Self-pay | Admitting: Radiation Oncology

## 2013-01-27 ENCOUNTER — Ambulatory Visit: Payer: 59

## 2013-01-27 ENCOUNTER — Ambulatory Visit
Admission: RE | Admit: 2013-01-27 | Discharge: 2013-01-27 | Disposition: A | Discharge status: Home / Self Care | Payer: 59 | Source: Ambulatory Visit | Attending: Radiation Oncology | Admitting: Radiation Oncology

## 2013-01-27 DIAGNOSIS — D320 Benign neoplasm of cerebral meninges: Secondary | ICD-10-CM

## 2013-01-27 MED ORDER — GADOBENATE DIMEGLUMINE 529 MG/ML IV SOLN
20.0000 mL | Freq: Once | INTRAVENOUS | Status: AC | PRN
  Administered 2013-01-27: 20 mL via INTRAVENOUS
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
## 2013-01-28 ENCOUNTER — Ambulatory Visit
Admission: RE | Admit: 2013-01-28 | Discharge: 2013-01-28 | Disposition: A | Discharge status: Home / Self Care | Payer: 59 | Source: Ambulatory Visit | Attending: Radiation Oncology | Admitting: Radiation Oncology

## 2013-01-28 ENCOUNTER — Encounter: Payer: Self-pay | Admitting: Radiation Oncology

## 2013-01-28 DIAGNOSIS — D329 Benign neoplasm of meninges, unspecified: Secondary | ICD-10-CM

## 2013-01-28 MED ORDER — SODIUM CHLORIDE 0.9 % IJ SOLN
10.0000 mL | Freq: Once | INTRAMUSCULAR | Status: AC
  Administered 2013-01-28: 10 mL via INTRAVENOUS

## 2013-01-28 NOTE — Progress Notes (Signed)
Patient hard of hearing. Denies nausea or vomiting. Reports diplopia when he attempts to focus up close. Reports ringing in the ears. Reports diminished sense of smell.Reports low back and hip pain 3 on a scale of 0-10. Reports he is only taking percocet for pain. Patient has been switched from neurontin that caused swelling in his feet to Lyrica. Reports steroid injection did not help to relieve back pain. Reports difficulty sleeping due to low back pain and inability to get comfortable. Reports numbness and tinging in extremities. Reports weakness in legs relate to L5 and S1. Patient walking with a cane. Patient reports that he is unable to stand on his tip toes.  Denies headaches. Denies seizure activity. Mostly concerned about pain in back associated with laying on the table and wearing a mask brought xanax and percocet. Reports that he finished taking Augmentin yesterday for sinus infection.

## 2013-01-28 NOTE — Progress Notes (Signed)
  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  NARRATIVE:  The patient was brought to the [facility].  Identity was confirmed.  All relevant records and images related to the planned course of therapy were reviewed.  The patient freely provided informed written consent to proceed with treatment after reviewing the details related to the planned course of therapy. The consent form was witnessed and verified by the simulation staff. Intravenous access was established for contrast administration. Then, the patient was set-up in a stable reproducible supine position for radiation therapy.  A relocatable thermoplastic stereotactic head frame was fabricated for precise immobilization.  CT images were obtained.  Surface markings were placed.  The CT images were loaded into the planning software and fused with the patient's targeting MRI scan.  Then the target and avoidance structures were contoured.  Treatment planning then occurred.  The radiation prescription was entered and confirmed.  I have requested 3D planning  I have requested a DVH of the following structures: Brain stem, brain, left eye, right I, lenses, optic chiasm, target volumes, uninvolved brain, and normal tissue.    PLAN:  The patient will receive 15 Gy in 1 fraction.  ________________________________  [name] Tammi Klippel, M.D.

## 2013-01-28 NOTE — Progress Notes (Signed)
See progress note under physician encounter. 

## 2013-01-28 NOTE — Progress Notes (Signed)
Radiation Oncology         ([phone] ________________________________  Name: Cody Tate MRN: [id number]  Date: 01/28/2013  DOB: Nov 04, 1950  Follow-Up Visit Note  CC: [name], MD  Charlie Pitter, MD  Diagnosis:   63 y.o. 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  I initially met with him on 10/29, and we considered proceeding with SRS to treat the meningioma.  Ultimately, we elected to continue surveillance at that time, and MRI has been repeated.  We reviewed the MRI this morning in brain conference.                              ALLERGIES:  is allergic to adhesive and other.  Meds: Current Outpatient Prescriptions  Medication Sig Dispense Refill  . AFLURIA PRESERVATIVE FREE injection       . ALPRAZolam (XANAX) 0.25 MG tablet Take 2 tablets (0.5 mg total) by mouth 3 (three) times daily as needed for sleep (20 min before MRI or radiation appointment).  30 tablet  0  . diphenhydrAMINE (SOMINEX) 25 MG tablet Take 25 mg by mouth at bedtime as needed for sleep.       Marland Kitchen esomeprazole (NEXIUM) 40 MG capsule Take 40 mg by mouth daily before breakfast.      . ferrous sulfate 325 (65 FE) MG tablet Take 325 mg by mouth daily with breakfast.      . fluticasone (FLONASE) 50 MCG/ACT nasal spray Place 2 sprays into both nostrils at bedtime.       Marland Kitchen guaifenesin (HUMIBID E) 400 MG TABS Take 400 mg by mouth every 6 (six) hours as needed (for congestion). For mucus      . hydrochlorothiazide (HYDRODIURIL) 25 MG tablet Take 25 mg by mouth every morning.       Marland Kitchen ibuprofen (ADVIL,MOTRIN) 200 MG tablet Take 600 mg by mouth every 6 (six) hours as needed for pain. For pain      . latanoprost (XALATAN) 0.005 % ophthalmic solution       . magnesium hydroxide (MILK OF MAGNESIA) 400 MG/5ML suspension Take 15 mLs by mouth at bedtime as needed for constipation. For constipation      . metoprolol succinate (TOPROL-XL)  100 MG 24 hr tablet Take 100 mg by mouth every evening. Take with or immediately following a meal.      . oxyCODONE-acetaminophen (PERCOCET/ROXICET) 5-325 MG per tablet       . pregabalin (LYRICA) 75 MG capsule Take 75 mg by mouth 2 (two) times daily.      . pseudoephedrine (SUDAFED) 30 MG tablet Take 30 mg by mouth every 6 (six) hours as needed. For congestion      . ramipril (ALTACE) 10 MG capsule Take 10 mg by mouth every morning.       . Simethicone (GAS-X EXTRA STRENGTH PO) Take 1 tablet by mouth 4 (four) times daily as needed (for gas). For upset stomach      . simvastatin (ZOCOR) 20 MG tablet Take 20 mg by mouth every evening.      Marland Kitchen spironolactone (ALDACTONE) 25 MG tablet Take 12.5 mg by mouth every evening.       . Tamsulosin HCl (FLOMAX) 0.4 MG CAPS Take 0.4 mg by mouth 2 (two) times daily.      . Travoprost, BAK Free, (TRAVATAN) 0.004 % SOLN ophthalmic  solution Place 1 drop into both eyes at bedtime.      Marland Kitchen HYDROcodone-acetaminophen (NORCO/VICODIN) 5-325 MG per tablet       . methocarbamol (ROBAXIN) 500 MG tablet Take 1 tablet (500 mg total) by mouth 4 (four) times daily. As needed for muscle spasm  30 tablet  1  . oxyCODONE (ROXICODONE) 5 MG immediate release tablet Take 1-2 tablets (5-10 mg total) by mouth every 4 (four) hours as needed for pain.  50 tablet  0   No current facility-administered medications for this encounter.    Physical Findings: The patient is in no acute distress. Patient is alert and oriented. .  No significant changes.  Lab Findings: Lab Results  Component Value Date   WBC 7.5 07/15/2012   HGB 12.9* 07/15/2012   HCT 41.0 07/15/2012   MCV 71.2* 07/15/2012   PLT 379 07/15/2012    '@LASTCHEM' @  Radiographic Findings: Mr Kizzie Fantasia Contrast  01/27/2013   CLINICAL DATA:  63 year old male with history of right tentorial meningioma. Under consideration for stereotactic radiosurgery. Subsequent encounter.  EXAM: MRI HEAD WITHOUT AND WITH CONTRAST  TECHNIQUE:  Multiplanar, multiecho pulse sequences of the brain and surrounding structures were obtained without and with intravenous contrast.  CONTRAST:  45m MULTIHANCE GADOBENATE DIMEGLUMINE 529 MG/ML IV SOLN  COMPARISON:  11/07/2012 and earlier.  FINDINGS: Right tentorial incisura homogeneously enhancing meningioma re- identified, encompassing 15 x 12 x 17 mm AP by transverse by CC (15 x 11 x 16 mm at a comparable level on 11/07/2012). As before, there is some associated more generalized thickening of the right incisura, not significantly changed (series 10, image 60). Mild mass effect on the right midbrain (series 9, image 24) is stable. No associated brainstem edema.  No other abnormal intracranial enhancement. No restricted diffusion to suggest acute infarction. No midline shift, ventriculomegaly, or acute intracranial hemorrhage. Cervicomedullary junction and pituitary are within normal limits. Negative visualized cervical spine. Major intracranial vascular flow voids are stable. Stable gray and white matter signal throughout the brain. Visible internal auditory structures appear normal. Stable orbits soft tissues. Stable paranasal sinuses. Mastoids remain clear. Normal bone marrow signal. Visualized scalp soft tissues are within normal limits.  IMPRESSION: 1. Right tentorial incisura 15 x 12 x 17 mm meningioma not significantly changed since 11/07/2012, with stable adjacent more generalized dural thickening. Stable mild mass effect on the midbrain without edema. 2. No new intracranial abnormality.   Electronically Signed   By: LLars PinksM.D.   On: 01/27/2013 16:05   CC[place]ton ValleyCm  01/13/2013   CLINICAL DATA:  Congestion in sinusitis, right greater than left. Headaches. Septoplasty in 2006. Recent antibiotic course.  EXAM: CT PARANASAL SINUS LIMITED WITHOUT CONTRAST  TECHNIQUE: Non-contiguous multidetector CT images of the paranasal sinuses were obtained in a single plane without contrast.  COMPARISON:   MR HEAD WO/W CM dated 11/07/2012; CT HEAD W/O CM dated 05/17/2012  FINDINGS: Chronic bony thickening along posterior portions of the ethmoid sinuses as shown on prior exams. Minimal mucosal thickening in the right ethmoid air cells. Remaining paranasal sinuses of intact were visualized.  Possible periapical lucency along the left medial maxillary incisor.  IMPRESSION: 1. Minimal chronic ethmoid sinusitis. 2. Suspected periapical lucency along the left medial maxillary incisor   Electronically Signed   By: WSherryl BartersM.D.   On: 01/13/2013 16:55    Impression:  The patient has a right tentorial meningioma with compression of the brainstem.  If this lesion progresses,  the patient would be at risk for major neurologic sequelae.  Plan:  Today, we again discussed SRS for his meningioma in terms of pros and cons.  He would like to proceed with SRS to help prevent brainstem injury.  _____________________________________  [name] Tammi Klippel, M.D.

## 2013-02-04 ENCOUNTER — Ambulatory Visit
Admission: RE | Admit: 2013-02-04 | Discharge: 2013-02-04 | Disposition: A | Discharge status: Home / Self Care | Payer: 59 | Source: Ambulatory Visit | Attending: Radiation Oncology | Admitting: Radiation Oncology

## 2013-02-04 ENCOUNTER — Encounter: Payer: Self-pay | Admitting: Radiation Oncology

## 2013-02-04 VITALS — BP 129/68 | HR 59 | Temp 97.4°F | Resp 16

## 2013-02-04 DIAGNOSIS — D329 Benign neoplasm of meninges, unspecified: Secondary | ICD-10-CM

## 2013-02-04 NOTE — Op Note (Signed)
  Name: Cody Tate  MRN: [id number]  Date: 02/04/2013   DOB: 1950/03/06  Stereotactic Radiosurgery Operative Note  PRE-OPERATIVE DIAGNOSIS:  Solitary Brain Metastasis  POST-OPERATIVE DIAGNOSIS:  Solitary Brain Metastasis  PROCEDURE:  Stereotactic Radiosurgery  SURGEON:  Charlie Pitter, MD  NARRATIVE: The patient underwent a radiation treatment planning session in the radiation oncology simulation suite under the care of the radiation oncology physician and physicist.  I participated closely in the radiation treatment planning afterwards. The patient underwent planning CT which was fused to 3T high resolution MRI with 1 mm axial slices.  These images were fused on the planning system.  Radiation oncology contoured the gross target volume and subsequently expanded this to yield the Planning Target Volume. I actively participated in the planning process.  I helped to define and review the target contours and also the contours of the optic pathway, eyes, brainstem and selected nearby organs at risk.  All the dose constraints for critical structures were reviewed and compared to AAPM Task Group 101.  The prescription dose conformity was reviewed.  I approved the plan electronically.    Accordingly, Cody Tate was brought to the TrueBeam stereotactic radiation treatment linac and placed in the custom immobilization mask.  The patient was aligned according to the IR fiducial markers with BrainLab Exactrac, then orthogonal x-rays were used in ExacTrac with the 6DOF robotic table and the shifts were made to align the patient  This lesion was complex because it was 3.5 cm or greater, adjacent (within 64mm) of the optic nerve, optic chasm, optic tract, and/or within the brainstem is complex  Cody Tate received stereotactic radiosurgery uneventfully.  The detailed description of the procedure is recorded in the radiation oncology procedure note.  I was present for the duration of the  procedure.  DISPOSITION:  Following delivery, the patient was transported to nursing in stable condition and monitored for possible acute effects to be discharged to home in stable condition with follow-up in one month.  Charlie Pitter, MD 02/04/2013 1:43 PM

## 2013-02-04 NOTE — Progress Notes (Signed)
  Radiation Oncology         [phone] ________________________________  Stereotactic Treatment Procedure Note  Name: Cody Tate MRN: [id number]  Date: 02/04/2013  DOB: April 15, 1950  SPECIAL TREATMENT PROCEDURE  3D TREATMENT PLANNING AND DOSIMETRY:  The patient's radiation plan was reviewed and approved by neurosurgery and radiation oncology prior to treatment.  It showed 3-dimensional radiation distributions overlaid onto the planning CT/MRI image set.  The [facility] for the target structures as well as the organs at risk were reviewed. The documentation of the 3D plan and dosimetry are filed in the radiation oncology EMR.  NARRATIVE:  Cody Tate was brought to the TrueBeam stereotactic radiation treatment machine and placed supine on the CT couch. The head frame was applied, and the patient was set up for stereotactic radiosurgery.  Neurosurgery was present for the set-up and delivery  SIMULATION VERIFICATION:  In the couch zero-angle position, the patient underwent Exactrac imaging using the Brainlab system with orthogonal KV images.  These were carefully aligned and repeated to confirm treatment position for each of the isocenters.  The Exactrac snap film verification was repeated at each couch angle.  SPECIAL TREATMENT PROCEDURE: Cody Tate received stereotactic radiosurgery to the following targets: Right tentorial 17 mm target was treated using 4 Rapid Arc VMAT Beams to a prescription dose of 15 Gy.  ExacTrac registration was performed for each couch angle.  The 100% isodose line was prescribed.   STEREOTACTIC TREATMENT MANAGEMENT:  Following delivery, the patient was transported to nursing in stable condition and monitored for possible acute effects.  Vital signs were recorded BP 129/68  Pulse 59  Temp(Src) 97.4 F (36.3 C) (Oral)  Resp 16. The patient tolerated treatment without significant acute effects, and was discharged to home in stable condition.    PLAN:  Follow-up in one month.  ________________________________  Cody Tate. Tammi Klippel, M.D.

## 2013-02-06 ENCOUNTER — Other Ambulatory Visit: Payer: 59

## 2013-02-06 ENCOUNTER — Ambulatory Visit: Payer: 59 | Admitting: Radiation Oncology

## 2013-02-06 ENCOUNTER — Encounter: Payer: Self-pay | Admitting: Radiation Oncology

## 2013-02-06 NOTE — Progress Notes (Signed)
One hour nurse monitoring following SRS treatment complete. Patient alert and oriented to person, place, and time. No distress noted. Patient denies nausea, vomiting, headache, dizziness, diplopia or ringing in the ears. Patient denies pain at this time. Patient discharged home with wife. Patient ambulated out of the clinic with wife.

## 2013-02-08 NOTE — Progress Notes (Signed)
  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 ExacTrac registration was performed for each couch angle. The 100% isodose line was prescribed.  6 MV X-rays were used.  Narrative: The patient tolerated radiation treatment relatively well.     Plan: The patient has completed radiation treatment. The patient will return to radiation oncology clinic for routine followup in one month. I advised them to call or return sooner if they have any questions or concerns related to their recovery or treatment. ________________________________  [name]. Tammi Klippel, M.D.

## 2013-02-09 ENCOUNTER — Ambulatory Visit: Payer: 59 | Admitting: Radiation Oncology

## 2013-02-09 ENCOUNTER — Ambulatory Visit: Payer: 59

## 2013-02-11 ENCOUNTER — Ambulatory Visit: Payer: 59 | Admitting: Radiation Oncology

## 2013-02-12 ENCOUNTER — Ambulatory Visit: Payer: 59 | Admitting: Radiation Oncology

## 2013-02-25 ENCOUNTER — Other Ambulatory Visit: Payer: Self-pay | Admitting: Gastroenterology

## 2013-03-16 ENCOUNTER — Encounter: Payer: Self-pay | Admitting: Radiation Oncology

## 2013-03-16 ENCOUNTER — Ambulatory Visit
Admission: RE | Admit: 2013-03-16 | Discharge: 2013-03-16 | Disposition: A | Discharge status: Home / Self Care | Payer: 59 | Source: Ambulatory Visit | Attending: Radiation Oncology | Admitting: Radiation Oncology

## 2013-03-16 VITALS — BP 130/65 | HR 62 | Resp 18 | Wt 224.1 lb

## 2013-03-16 DIAGNOSIS — D329 Benign neoplasm of meninges, unspecified: Secondary | ICD-10-CM

## 2013-03-16 NOTE — Progress Notes (Signed)
Patient walks with cane. Reports occasional brief dizzy spells. Reports occasional slight frontal headaches which he relates to his sinuses. Reports nasal congestion. Very concerned about short term memory loss. Reports occasional brief spells of nausea. Denies emesis. Reports floaters continue. Reports ringing in the ears which is occasionally very loud and annoying.

## 2013-03-16 NOTE — Progress Notes (Signed)
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  Patient walks with cane. Reports occasional brief dizzy spells. Reports occasional slight frontal headaches which he relates to his sinuses. Reports nasal congestion. Very concerned about short term memory loss. Reports occasional brief spells of nausea. Denies emesis. Reports floaters continue. Reports ringing in the ears which is occasionally very loud and annoying.                                ALLERGIES:  is allergic to adhesive and other.  Meds: Current Outpatient Prescriptions  Medication Sig Dispense Refill  . albuterol (PROVENTIL HFA;VENTOLIN HFA) 108 (90 BASE) MCG/ACT inhaler Inhale into the lungs every 6 (six) hours as needed for wheezing or shortness of breath.      . diphenhydrAMINE (SOMINEX) 25 MG tablet Take 25 mg by mouth at bedtime as needed for sleep.       Marland Kitchen esomeprazole (NEXIUM) 40 MG capsule Take 40 mg by mouth daily before breakfast.      . ferrous sulfate 325 (65 FE) MG tablet Take 325 mg by mouth daily with breakfast.      . fluticasone (FLONASE) 50 MCG/ACT nasal spray Place 2 sprays into both nostrils at bedtime.       Marland Kitchen guaifenesin (HUMIBID E) 400 MG TABS Take 400 mg by mouth every 6 (six) hours as needed (for congestion). For mucus      . hydrochlorothiazide (HYDRODIURIL) 25 MG tablet Take 25 mg by mouth every morning.       Marland Kitchen ibuprofen (ADVIL,MOTRIN) 200 MG tablet Take 600 mg by mouth every 6 (six) hours as needed for pain. For pain      . latanoprost (XALATAN) 0.005 % ophthalmic  solution       . magnesium hydroxide (MILK OF MAGNESIA) 400 MG/5ML suspension Take 15 mLs by mouth at bedtime as needed for constipation. For constipation      . metoprolol succinate (TOPROL-XL) 100 MG 24 hr tablet Take 100 mg by mouth every evening. Take with or immediately following a meal.      . oxyCODONE (ROXICODONE) 5 MG immediate release tablet Take 1-2 tablets (5-10 mg total) by mouth every 4 (four) hours as needed for pain.  50 tablet  0  . oxyCODONE-acetaminophen (PERCOCET/ROXICET) 5-325 MG per tablet       . pregabalin (LYRICA) 75 MG capsule Take 75 mg by mouth 2 (two) times daily.      . pseudoephedrine (SUDAFED) 30 MG tablet Take 30 mg by mouth every 6 (six) hours as needed. For congestion      . ramipril (ALTACE) 10 MG capsule Take 10 mg by mouth every morning.       . Simethicone (GAS-X EXTRA STRENGTH PO) Take 1 tablet by mouth 4 (four) times daily as needed (for gas). For upset stomach      . simvastatin (ZOCOR) 20 MG tablet Take 20 mg by mouth every evening.      Marland Kitchen spironolactone (ALDACTONE)  25 MG tablet Take 12.5 mg by mouth every evening.       . Tamsulosin HCl (FLOMAX) 0.4 MG CAPS Take 0.4 mg by mouth 2 (two) times daily.      . Travoprost, BAK Free, (TRAVATAN) 0.004 % SOLN ophthalmic solution Place 1 drop into both eyes at bedtime.      Marland Kitchen AFLURIA PRESERVATIVE FREE injection       . ALPRAZolam (XANAX) 0.25 MG tablet Take 2 tablets (0.5 mg total) by mouth 3 (three) times daily as needed for sleep (20 min before MRI or radiation appointment).  30 tablet  0  . HYDROcodone-acetaminophen (NORCO/VICODIN) 5-325 MG per tablet       . methocarbamol (ROBAXIN) 500 MG tablet Take 1 tablet (500 mg total) by mouth 4 (four) times daily. As needed for muscle spasm  30 tablet  1   No current facility-administered medications for this encounter.    Physical Findings: The patient is in no acute distress. Patient is alert and oriented.  weight is 224 lb 1.6 oz (101.651 kg). His blood pressure  is 130/65 and his pulse is 62. His respiration is 18. .  No significant changes.  Impression:  The patient is recovering from the effects of radiation.  He has had some chronic sinusitis issues with headaches and some balance issues.  Plan:  MRI at 3 months and follow-up with Dr. Annette Stable and myself.  _____________________________________  [name]. Tammi Klippel, M.D.

## 2013-03-23 ENCOUNTER — Other Ambulatory Visit: Payer: Self-pay | Admitting: Radiation Therapy

## 2013-03-23 DIAGNOSIS — D320 Benign neoplasm of cerebral meninges: Secondary | ICD-10-CM

## 2013-05-26 ENCOUNTER — Other Ambulatory Visit: Payer: 59

## 2013-06-01 ENCOUNTER — Other Ambulatory Visit: Payer: Self-pay | Admitting: Radiation Therapy

## 2013-06-01 DIAGNOSIS — C7931 Secondary malignant neoplasm of brain: Secondary | ICD-10-CM

## 2013-06-01 DIAGNOSIS — C7949 Secondary malignant neoplasm of other parts of nervous system: Principal | ICD-10-CM

## 2013-06-05 ENCOUNTER — Ambulatory Visit
Admission: RE | Admit: 2013-06-05 | Discharge: 2013-06-05 | Disposition: A | Discharge status: Home / Self Care | Payer: 59 | Source: Ambulatory Visit | Attending: Radiation Oncology | Admitting: Radiation Oncology

## 2013-06-05 DIAGNOSIS — D320 Benign neoplasm of cerebral meninges: Secondary | ICD-10-CM | POA: Insufficient documentation

## 2013-06-05 LAB — BUN AND CREATININE (CC13)
BUN: 24.2 mg/dL (ref 7.0–26.0)
Creatinine: 1.1 mg/dL (ref 0.7–1.3)

## 2013-06-05 MED ORDER — GADOBENATE DIMEGLUMINE 529 MG/ML IV SOLN
20.0000 mL | Freq: Once | INTRAVENOUS | Status: AC | PRN
  Administered 2013-06-05: 20 mL via INTRAVENOUS

## 2013-06-10 DIAGNOSIS — D320 Benign neoplasm of cerebral meninges: Secondary | ICD-10-CM | POA: Insufficient documentation

## 2013-07-02 ENCOUNTER — Encounter: Payer: Self-pay | Admitting: *Deleted

## 2013-07-20 ENCOUNTER — Other Ambulatory Visit: Payer: Self-pay | Admitting: Radiation Therapy

## 2013-07-20 DIAGNOSIS — D320 Benign neoplasm of cerebral meninges: Secondary | ICD-10-CM

## 2013-07-29 ENCOUNTER — Other Ambulatory Visit: Payer: Self-pay | Admitting: Radiation Therapy

## 2013-07-29 DIAGNOSIS — C7931 Secondary malignant neoplasm of brain: Secondary | ICD-10-CM

## 2013-07-29 DIAGNOSIS — C7949 Secondary malignant neoplasm of other parts of nervous system: Principal | ICD-10-CM

## 2013-09-08 ENCOUNTER — Ambulatory Visit
Admission: RE | Admit: 2013-09-08 | Discharge: 2013-09-08 | Disposition: A | Discharge status: Home / Self Care | Payer: 59 | Source: Ambulatory Visit | Attending: Radiation Oncology | Admitting: Radiation Oncology

## 2013-09-08 DIAGNOSIS — D320 Benign neoplasm of cerebral meninges: Secondary | ICD-10-CM

## 2013-09-08 DIAGNOSIS — C7949 Secondary malignant neoplasm of other parts of nervous system: Principal | ICD-10-CM

## 2013-09-08 DIAGNOSIS — C7931 Secondary malignant neoplasm of brain: Secondary | ICD-10-CM | POA: Diagnosis present

## 2013-09-08 LAB — BUN AND CREATININE (CC13)
BUN: 21.9 mg/dL (ref 7.0–26.0)
Creatinine: 1 mg/dL (ref 0.7–1.3)

## 2013-09-08 MED ORDER — GADOBENATE DIMEGLUMINE 529 MG/ML IV SOLN
20.0000 mL | Freq: Once | INTRAVENOUS | Status: AC | PRN
  Administered 2013-09-08: 20 mL via INTRAVENOUS

## 2013-09-09 DIAGNOSIS — M545 Low back pain, unspecified: Secondary | ICD-10-CM | POA: Insufficient documentation

## 2013-09-10 ENCOUNTER — Other Ambulatory Visit: Payer: Self-pay | Admitting: Radiation Oncology

## 2013-09-10 ENCOUNTER — Telehealth: Payer: Self-pay | Admitting: Radiation Oncology

## 2013-09-10 DIAGNOSIS — F411 Generalized anxiety disorder: Secondary | ICD-10-CM

## 2013-09-10 DIAGNOSIS — D329 Benign neoplasm of meninges, unspecified: Secondary | ICD-10-CM

## 2013-09-10 MED ORDER — ALPRAZOLAM 0.25 MG PO TABS: 0.5000 mg | ORAL_TABLET | Freq: Three times a day (TID) | ORAL | Status: DC | PRN

## 2013-09-10 NOTE — Telephone Encounter (Signed)
Received refill request for xanax via mychart. Noted xanax script was fill one year ago by Dr. Tammi Klippel. Phoned patient to assess status. Patient denies complaints. Patient states, "I just want to make sure I have pills for my next MRI." Patient understands this writer will call back once script has been filled.

## 2013-09-11 ENCOUNTER — Telehealth: Payer: Self-pay | Admitting: Radiation Oncology

## 2013-09-11 NOTE — Telephone Encounter (Signed)
Per Dr. Johny Shears order call in Xanax 0.25 mg to [facility] on [address]. Spoke with Cristie Hem. Then, phoned patient to make him aware this was done. Patient verbalized understanding and expressed appreciation for the call.

## 2013-09-21 ENCOUNTER — Ambulatory Visit
Admission: RE | Admit: 2013-09-21 | Discharge: 2013-09-21 | Disposition: A | Discharge status: Home / Self Care | Payer: 59 | Source: Ambulatory Visit | Attending: Radiation Oncology | Admitting: Radiation Oncology

## 2013-09-21 ENCOUNTER — Encounter: Payer: Self-pay | Admitting: Radiation Oncology

## 2013-09-21 VITALS — BP 127/61 | HR 72 | Resp 16 | Wt 221.8 lb

## 2013-09-21 DIAGNOSIS — D329 Benign neoplasm of meninges, unspecified: Secondary | ICD-10-CM

## 2013-09-21 NOTE — Progress Notes (Signed)
Understands from Dr. Annette Stable that the right tentorial meningioma appears to be slightly smaller. Reports diplopia in his right eye when reading that is getting worse. Reports a constant ringing in his ears that is louder. Reports chronic low back and bilateral leg pain. Reports intermittent nausea that resolves on its own. Denies emesis. Reports dizziness and often feeling unsteady on his feet. Reports intermittent mild headaches questions if sinuses could be contributing to this. Reports decline in short term memory.

## 2013-09-21 NOTE — Progress Notes (Signed)
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  The recent films were presented in our multidisciplinary conference with neuroradiology just prior to the clinic.   He understands from Dr. Annette Tate that the right tentorial meningioma appears to be slightly smaller. Reports diplopia in his right eye when reading that is getting worse. Reports a constant ringing in his ears that is louder. Reports chronic low back and bilateral leg pain. Reports intermittent nausea that resolves on its own. Denies emesis. Reports dizziness and often feeling unsteady on his feet. Reports intermittent mild headaches questions if sinuses could be contributing to this. Reports decline in short term memory                              ALLERGIES:  is allergic to adhesive and other.  Meds: Current Outpatient Prescriptions  Medication Sig Dispense Refill  . AFLURIA PRESERVATIVE FREE injection       . albuterol (PROVENTIL HFA;VENTOLIN HFA) 108 (90 BASE) MCG/ACT inhaler Inhale into the lungs every 6 (six) hours as needed for wheezing or shortness of breath.      . diphenhydrAMINE (SOMINEX) 25 MG tablet Take 25 mg by mouth at bedtime as needed for sleep.       Marland Kitchen esomeprazole (NEXIUM) 40 MG capsule Take 40 mg by mouth daily before breakfast.      . fluticasone (FLONASE) 50 MCG/ACT nasal spray Place 2 sprays into both nostrils at bedtime.       Marland Kitchen guaifenesin (HUMIBID E) 400 MG TABS Take 400 mg by mouth every 6 (six) hours as needed (for congestion). For mucus      . hydrochlorothiazide (HYDRODIURIL) 25 MG  tablet Take 25 mg by mouth every morning.       Marland Kitchen ibuprofen (ADVIL,MOTRIN) 200 MG tablet Take 600 mg by mouth every 6 (six) hours as needed for pain. For pain      . magnesium hydroxide (MILK OF MAGNESIA) 400 MG/5ML suspension Take 15 mLs by mouth at bedtime as needed for constipation. For constipation      . metoprolol succinate (TOPROL-XL) 100 MG 24 hr tablet Take 100 mg by mouth every evening. Take with or immediately following a meal.      . oxyCODONE-acetaminophen (PERCOCET/ROXICET) 5-325 MG per tablet       . pregabalin (LYRICA) 75 MG capsule Take 75 mg by mouth 2 (two) times daily.      . pseudoephedrine (SUDAFED) 30 MG tablet Take 30 mg by mouth every 6 (six) hours as needed. For congestion      . ramipril (ALTACE) 10 MG capsule Take 10 mg by mouth every morning.       . Simethicone (GAS-X EXTRA STRENGTH PO) Take 1 tablet by mouth 4 (four) times daily as needed (for gas). For upset stomach      . simvastatin (ZOCOR) 20 MG tablet Take 20 mg by mouth every evening.      Marland Kitchen spironolactone (ALDACTONE) 25 MG  tablet Take 12.5 mg by mouth every evening.       . Tamsulosin HCl (FLOMAX) 0.4 MG CAPS Take 0.4 mg by mouth 2 (two) times daily.      . Travoprost, BAK Free, (TRAVATAN) 0.004 % SOLN ophthalmic solution Place 1 drop into both eyes at bedtime.      . ALPRAZolam (XANAX) 0.25 MG tablet Take 2 tablets (0.5 mg total) by mouth 3 (three) times daily as needed for sleep (20 min before MRI or radiation appointment).  30 tablet  0  . ferrous sulfate 325 (65 FE) MG tablet Take 325 mg by mouth daily with breakfast.      . HYDROcodone-acetaminophen (NORCO/VICODIN) 5-325 MG per tablet       . latanoprost (XALATAN) 0.005 % ophthalmic solution       . methocarbamol (ROBAXIN) 500 MG tablet Take 1 tablet (500 mg total) by mouth 4 (four) times daily. As needed for muscle spasm  30 tablet  1   No current facility-administered medications for this encounter.    Physical Findings: The patient is in no acute  distress. Patient is alert and oriented.  weight is 221 lb 12.8 oz (100.608 kg). His blood pressure is 127/61 and his pulse is 72. His respiration is 16. .  No significant changes.  Lab Findings: Lab Results  Component Value Date   WBC 7.5 07/15/2012   HGB 12.9* 07/15/2012   HCT 41.0 07/15/2012   MCV 71.2* 07/15/2012   PLT 379 07/15/2012    @LASTCHEM @  Radiographic Findings: Mr Cody Tate WU Contrast  09/08/2013   CLINICAL DATA:  Meningioma. Ten month restaging stereotactic radiosurgery  EXAM: MRI HEAD WITHOUT AND WITH CONTRAST  TECHNIQUE: Multiplanar, multiecho pulse sequences of the brain and surrounding structures were obtained without and with intravenous contrast.  CONTRAST:  7mL MULTIHANCE GADOBENATE DIMEGLUMINE 529 MG/ML IV SOLN  COMPARISON:  MRI 06/05/2013, 01/27/2013  FINDINGS: Right tentorial meningioma shows homogeneous enhancement. There is dural tail with thickening and enhancement of the right tentorium which is unchanged. The meningioma is slightly smaller prep fall 1 mm in size. The meningioma currently measures 9.5 x 14.4 x 14.8 mm, compared with 15.1 x 10.4 x 16.8 mm. Mild mass-effect on the right pons is unchanged. No edema in the pons.  No other enhancing lesions identified.  Generalized atrophy. Mild chronic microvascular ischemic change in the white matter.  Negative for acute infarct.  Mucosal edema in the paranasal sinuses.  IMPRESSION: Right tentorial meningioma appears slightly smaller by approximately 1 mm compared with the prior study. Mild flattening of the right pons without edema in the brainstem.  Mild chronic microvascular ischemic change.  No acute infarct.   Electronically Signed   By: Cody Tate M.D.   On: 09/08/2013 13:31    Impression:  The patient is recovering from the effects of radiation.  His treated meningioma is controlled.  Plan:  Consider MRI in 6 months then follow-up.  _____________________________________  Cody Tate. Cody Tate, M.D.

## 2013-10-16 ENCOUNTER — Other Ambulatory Visit: Payer: Self-pay

## 2013-12-21 ENCOUNTER — Other Ambulatory Visit: Payer: Self-pay | Admitting: Neurosurgery

## 2013-12-21 DIAGNOSIS — M47817 Spondylosis without myelopathy or radiculopathy, lumbosacral region: Secondary | ICD-10-CM

## 2014-01-06 ENCOUNTER — Ambulatory Visit
Admission: RE | Admit: 2014-01-06 | Discharge: 2014-01-06 | Disposition: A | Discharge status: Home / Self Care | Payer: Medicare Other | Source: Ambulatory Visit | Attending: Neurosurgery | Admitting: Neurosurgery

## 2014-01-06 DIAGNOSIS — M47817 Spondylosis without myelopathy or radiculopathy, lumbosacral region: Secondary | ICD-10-CM

## 2014-01-06 MED ORDER — GADOBENATE DIMEGLUMINE 529 MG/ML IV SOLN
20.0000 mL | Freq: Once | INTRAVENOUS | Status: AC | PRN
  Administered 2014-01-06: 20 mL via INTRAVENOUS

## 2014-02-03 ENCOUNTER — Other Ambulatory Visit: Payer: Self-pay | Admitting: Neurosurgery

## 2014-02-12 ENCOUNTER — Encounter (HOSPITAL_COMMUNITY)
Admission: RE | Admit: 2014-02-12 | Discharge: 2014-02-12 | Disposition: A | Discharge status: Home / Self Care | Payer: Medicare Other | Source: Ambulatory Visit | Attending: Neurosurgery | Admitting: Neurosurgery

## 2014-02-12 ENCOUNTER — Encounter (HOSPITAL_COMMUNITY): Payer: Self-pay

## 2014-02-12 DIAGNOSIS — M5126 Other intervertebral disc displacement, lumbar region: Secondary | ICD-10-CM | POA: Insufficient documentation

## 2014-02-12 DIAGNOSIS — Z0183 Encounter for blood typing: Secondary | ICD-10-CM | POA: Insufficient documentation

## 2014-02-12 DIAGNOSIS — Z01812 Encounter for preprocedural laboratory examination: Secondary | ICD-10-CM | POA: Diagnosis not present

## 2014-02-12 HISTORY — DX: Benign prostatic hyperplasia without lower urinary tract symptoms: N40.0

## 2014-02-12 HISTORY — DX: Chronic obstructive pulmonary disease, unspecified: J44.9

## 2014-02-12 LAB — CBC WITH DIFFERENTIAL/PLATELET
Basophils Absolute: 0.1 10*3/uL (ref 0.0–0.1)
Basophils Relative: 1 % (ref 0–1)
EOS ABS: 0.1 10*3/uL (ref 0.0–0.7)
EOS PCT: 2 % (ref 0–5)
HCT: 48.9 % (ref 39.0–52.0)
Hemoglobin: 17 g/dL (ref 13.0–17.0)
Lymphocytes Relative: 20 % (ref 12–46)
Lymphs Abs: 1.3 10*3/uL (ref 0.7–4.0)
MCH: 30.9 pg (ref 26.0–34.0)
MCHC: 34.8 g/dL (ref 30.0–36.0)
MCV: 88.9 fL (ref 78.0–100.0)
MONOS PCT: 10 % (ref 3–12)
Monocytes Absolute: 0.7 10*3/uL (ref 0.1–1.0)
Neutro Abs: 4.4 10*3/uL (ref 1.7–7.7)
Neutrophils Relative %: 67 % (ref 43–77)
PLATELETS: 262 10*3/uL (ref 150–400)
RBC: 5.5 MIL/uL (ref 4.22–5.81)
RDW: 12.2 % (ref 11.5–15.5)
WBC: 6.5 10*3/uL (ref 4.0–10.5)

## 2014-02-12 LAB — BASIC METABOLIC PANEL
ANION GAP: 8 (ref 5–15)
BUN: 17 mg/dL (ref 6–23)
CALCIUM: 9.2 mg/dL (ref 8.4–10.5)
CO2: 28 mmol/L (ref 19–32)
CREATININE: 0.96 mg/dL (ref 0.50–1.35)
Chloride: 101 mmol/L (ref 96–112)
GFR calc Af Amer: >90 mL/min (ref >90)
GFR calc non Af Amer: 86 mL/min — ABNORMAL LOW (ref >90)
GLUCOSE: 87 mg/dL (ref 70–99)
Potassium: 4.8 mmol/L (ref 3.5–5.1)
SODIUM: 137 mmol/L (ref 135–145)

## 2014-02-12 LAB — SURGICAL PCR SCREEN
MRSA, PCR: NEGATIVE
STAPHYLOCOCCUS AUREUS: NEGATIVE

## 2014-02-12 LAB — TYPE AND SCREEN
ABO/RH(D): O POS
Antibody Screen: NEGATIVE

## 2014-02-12 NOTE — Pre-Procedure Instructions (Addendum)
Cody Tate  02/12/2014   Your procedure is scheduled on:  02-22-2014  Monday   Report to [facility] Admitting at 5:30 AM.   Call this number if you have problems the morning of surgery: [phone]   Remember:   Do not eat food or drink liquids after midnight.    Take these medicines the morning of surgery with A SIP OF WATER:albuterol inhaler if needed,guaifensin,afrin,lyrica,tamsulosin,pain medication if needed,gas-x   Do not wear jewelry,   Do not wear lotions, powders, or perfumes. You may not wear deodorant.   Do not shave 48 hours prior to surgery. Men may shave face and neck.  Do not bring valuables to the hospital.  [facility] is not responsible  for any belongings or valuables.               Contacts, dentures or bridgework may not be worn into surgery.   Leave suitcase in the car. After surgery it may be brought to your room.  For patients admitted to the hospital, discharge time is determined by your treatment team.               Patients discharged the day of surgery will not be allowed to drive home.    Special Instructions: See attached sheet for instructions on CHG shower/bath    Please read over the following fact sheets that you were given: Pain Booklet, Blood Transfusion Information and Surgical Site Infection Prevention

## 2014-02-21 MED ORDER — DEXAMETHASONE SODIUM PHOSPHATE 10 MG/ML IJ SOLN
10.0000 mg | INTRAMUSCULAR | Status: DC
  Filled 2014-02-21: qty 1

## 2014-02-21 MED ORDER — CEFAZOLIN SODIUM-DEXTROSE 2-3 GM-% IV SOLR
2.0000 g | INTRAVENOUS | Status: DC
  Filled 2014-02-21: qty 50

## 2014-02-22 ENCOUNTER — Inpatient Hospital Stay (HOSPITAL_COMMUNITY): Payer: Medicare Other | Admitting: Anesthesiology

## 2014-02-22 ENCOUNTER — Encounter (HOSPITAL_COMMUNITY)
Admission: RE | Disposition: A | Discharge status: Home / Self Care | Payer: Medicare Other | Source: Ambulatory Visit | Attending: Neurosurgery

## 2014-02-22 ENCOUNTER — Inpatient Hospital Stay (HOSPITAL_COMMUNITY): Payer: Medicare Other

## 2014-02-22 ENCOUNTER — Inpatient Hospital Stay (HOSPITAL_COMMUNITY)
Admission: RE | Admit: 2014-02-22 | Discharge: 2014-02-23 | DRG: 460 | Disposition: A | Discharge status: Home / Self Care | Payer: Medicare Other | Source: Ambulatory Visit | Attending: Neurosurgery | Admitting: Neurosurgery

## 2014-02-22 ENCOUNTER — Encounter (HOSPITAL_COMMUNITY): Payer: Self-pay | Admitting: Anesthesiology

## 2014-02-22 DIAGNOSIS — N400 Enlarged prostate without lower urinary tract symptoms: Secondary | ICD-10-CM | POA: Diagnosis present

## 2014-02-22 DIAGNOSIS — Z87891 Personal history of nicotine dependence: Secondary | ICD-10-CM | POA: Diagnosis not present

## 2014-02-22 DIAGNOSIS — M5116 Intervertebral disc disorders with radiculopathy, lumbar region: Secondary | ICD-10-CM | POA: Diagnosis not present

## 2014-02-22 DIAGNOSIS — I10 Essential (primary) hypertension: Secondary | ICD-10-CM | POA: Diagnosis not present

## 2014-02-22 DIAGNOSIS — J449 Chronic obstructive pulmonary disease, unspecified: Secondary | ICD-10-CM | POA: Diagnosis present

## 2014-02-22 DIAGNOSIS — M4806 Spinal stenosis, lumbar region: Secondary | ICD-10-CM | POA: Diagnosis present

## 2014-02-22 DIAGNOSIS — K219 Gastro-esophageal reflux disease without esophagitis: Secondary | ICD-10-CM | POA: Diagnosis present

## 2014-02-22 DIAGNOSIS — Z419 Encounter for procedure for purposes other than remedying health state, unspecified: Secondary | ICD-10-CM

## 2014-02-22 DIAGNOSIS — M549 Dorsalgia, unspecified: Secondary | ICD-10-CM | POA: Diagnosis present

## 2014-02-22 HISTORY — PX: LUMBAR FUSION: SHX111

## 2014-02-22 SURGERY — POSTERIOR LUMBAR FUSION 1 LEVEL
Anesthesia: General | Site: Spine Lumbar

## 2014-02-22 MED ORDER — PREGABALIN 75 MG PO CAPS
75.0000 mg | ORAL_CAPSULE | Freq: Three times a day (TID) | ORAL | Status: DC
  Administered 2014-02-22 – 2014-02-23 (×4): 75 mg via ORAL
  Filled 2014-02-22 (×4): qty 1

## 2014-02-22 MED ORDER — CEFAZOLIN SODIUM-DEXTROSE 2-3 GM-% IV SOLR
INTRAVENOUS | Status: DC | PRN
  Administered 2014-02-22: 2 g via INTRAVENOUS

## 2014-02-22 MED ORDER — MIDAZOLAM HCL 5 MG/5ML IJ SOLN
INTRAMUSCULAR | Status: DC | PRN
  Administered 2014-02-22: 2 mg via INTRAVENOUS

## 2014-02-22 MED ORDER — ALBUTEROL SULFATE HFA 108 (90 BASE) MCG/ACT IN AERS
INHALATION_SPRAY | RESPIRATORY_TRACT | Status: AC
  Filled 2014-02-22: qty 6.7

## 2014-02-22 MED ORDER — ARTIFICIAL TEARS OP OINT
TOPICAL_OINTMENT | OPHTHALMIC | Status: DC | PRN
  Administered 2014-02-22: 1 via OPHTHALMIC

## 2014-02-22 MED ORDER — METOPROLOL SUCCINATE ER 100 MG PO TB24
100.0000 mg | ORAL_TABLET | Freq: Every evening | ORAL | Status: DC
  Administered 2014-02-22: 100 mg via ORAL
  Filled 2014-02-22: qty 1

## 2014-02-22 MED ORDER — MIDAZOLAM HCL 2 MG/2ML IJ SOLN
INTRAMUSCULAR | Status: AC
  Filled 2014-02-22: qty 2

## 2014-02-22 MED ORDER — HYDROMORPHONE HCL 1 MG/ML IJ SOLN
INTRAMUSCULAR | Status: AC
  Administered 2014-02-22: 0.5 mg via INTRAVENOUS
  Filled 2014-02-22: qty 1

## 2014-02-22 MED ORDER — NEOSTIGMINE METHYLSULFATE 10 MG/10ML IV SOLN
INTRAVENOUS | Status: AC
  Filled 2014-02-22: qty 1

## 2014-02-22 MED ORDER — ARTIFICIAL TEARS OP OINT
TOPICAL_OINTMENT | OPHTHALMIC | Status: AC
  Filled 2014-02-22: qty 3.5

## 2014-02-22 MED ORDER — BUPIVACAINE HCL (PF) 0.25 % IJ SOLN
INTRAMUSCULAR | Status: DC | PRN
  Administered 2014-02-22: 30 mL

## 2014-02-22 MED ORDER — FENTANYL CITRATE 0.05 MG/ML IJ SOLN
INTRAMUSCULAR | Status: AC
  Filled 2014-02-22: qty 5

## 2014-02-22 MED ORDER — ROCURONIUM BROMIDE 50 MG/5ML IV SOLN
INTRAVENOUS | Status: AC
  Filled 2014-02-22: qty 1

## 2014-02-22 MED ORDER — HYDROMORPHONE HCL 1 MG/ML IJ SOLN
0.5000 mg | INTRAMUSCULAR | Status: DC | PRN
  Administered 2014-02-22 – 2014-02-23 (×4): 1 mg via INTRAVENOUS
  Filled 2014-02-22 (×4): qty 1

## 2014-02-22 MED ORDER — FENTANYL CITRATE 0.05 MG/ML IJ SOLN
INTRAMUSCULAR | Status: DC | PRN
  Administered 2014-02-22 (×3): 50 ug via INTRAVENOUS

## 2014-02-22 MED ORDER — ONDANSETRON HCL 4 MG/2ML IJ SOLN
INTRAMUSCULAR | Status: DC | PRN
  Administered 2014-02-22: 4 mg via INTRAVENOUS

## 2014-02-22 MED ORDER — PROPOFOL 10 MG/ML IV BOLUS
INTRAVENOUS | Status: AC
  Filled 2014-02-22: qty 20

## 2014-02-22 MED ORDER — DIAZEPAM 5 MG PO TABS
ORAL_TABLET | ORAL | Status: AC
  Filled 2014-02-22: qty 1

## 2014-02-22 MED ORDER — SUCCINYLCHOLINE CHLORIDE 20 MG/ML IJ SOLN
INTRAMUSCULAR | Status: AC
  Filled 2014-02-22: qty 1

## 2014-02-22 MED ORDER — PROPOFOL 10 MG/ML IV BOLUS
INTRAVENOUS | Status: DC | PRN
  Administered 2014-02-22: 30 mg via INTRAVENOUS
  Administered 2014-02-22: 170 mg via INTRAVENOUS

## 2014-02-22 MED ORDER — RAMIPRIL 5 MG PO CAPS
10.0000 mg | ORAL_CAPSULE | Freq: Every morning | ORAL | Status: DC
  Administered 2014-02-22 – 2014-02-23 (×2): 10 mg via ORAL
  Filled 2014-02-22 (×2): qty 2

## 2014-02-22 MED ORDER — DEXAMETHASONE SODIUM PHOSPHATE 10 MG/ML IJ SOLN
INTRAMUSCULAR | Status: DC | PRN
  Administered 2014-02-22: 10 mg via INTRAVENOUS

## 2014-02-22 MED ORDER — MEPERIDINE HCL 25 MG/ML IJ SOLN: 6.2500 mg | INTRAMUSCULAR | Status: DC | PRN

## 2014-02-22 MED ORDER — THROMBIN 20000 UNITS EX SOLR
CUTANEOUS | Status: DC | PRN
  Administered 2014-02-22: 20 mL via TOPICAL

## 2014-02-22 MED ORDER — BISACODYL 10 MG RE SUPP: 10.0000 mg | Freq: Every day | RECTAL | Status: DC | PRN

## 2014-02-22 MED ORDER — GLYCOPYRROLATE 0.2 MG/ML IJ SOLN
INTRAMUSCULAR | Status: AC
  Filled 2014-02-22: qty 3

## 2014-02-22 MED ORDER — VANCOMYCIN HCL 1000 MG IV SOLR
INTRAVENOUS | Status: DC | PRN
  Administered 2014-02-22: 1000 mg via TOPICAL

## 2014-02-22 MED ORDER — HYDROCHLOROTHIAZIDE 25 MG PO TABS
25.0000 mg | ORAL_TABLET | Freq: Every morning | ORAL | Status: DC
  Administered 2014-02-22 – 2014-02-23 (×2): 25 mg via ORAL
  Filled 2014-02-22 (×2): qty 1

## 2014-02-22 MED ORDER — LIDOCAINE HCL (CARDIAC) 20 MG/ML IV SOLN
INTRAVENOUS | Status: AC
  Filled 2014-02-22: qty 5

## 2014-02-22 MED ORDER — FLEET ENEMA 7-19 GM/118ML RE ENEM: 1.0000 | ENEMA | Freq: Once | RECTAL | Status: AC | PRN

## 2014-02-22 MED ORDER — VANCOMYCIN HCL 1000 MG IV SOLR
INTRAVENOUS | Status: AC
  Filled 2014-02-22: qty 1000

## 2014-02-22 MED ORDER — ALBUTEROL SULFATE HFA 108 (90 BASE) MCG/ACT IN AERS
INHALATION_SPRAY | RESPIRATORY_TRACT | Status: DC | PRN
  Administered 2014-02-22: 4 via RESPIRATORY_TRACT

## 2014-02-22 MED ORDER — DEXAMETHASONE SODIUM PHOSPHATE 10 MG/ML IJ SOLN
10.0000 mg | Freq: Once | INTRAMUSCULAR | Status: AC
  Administered 2014-02-22: 10 mg via INTRAVENOUS
  Filled 2014-02-22: qty 1

## 2014-02-22 MED ORDER — ONDANSETRON HCL 4 MG/2ML IJ SOLN
INTRAMUSCULAR | Status: AC
  Filled 2014-02-22: qty 2

## 2014-02-22 MED ORDER — MENTHOL 3 MG MT LOZG: 1.0000 | LOZENGE | OROMUCOSAL | Status: DC | PRN

## 2014-02-22 MED ORDER — SODIUM CHLORIDE 0.9 % IJ SOLN: 3.0000 mL | INTRAMUSCULAR | Status: DC | PRN

## 2014-02-22 MED ORDER — POLYETHYLENE GLYCOL 3350 17 G PO PACK: 17.0000 g | PACK | Freq: Every day | ORAL | Status: DC | PRN

## 2014-02-22 MED ORDER — TAMSULOSIN HCL 0.4 MG PO CAPS
0.4000 mg | ORAL_CAPSULE | Freq: Two times a day (BID) | ORAL | Status: DC
  Administered 2014-02-22 – 2014-02-23 (×3): 0.4 mg via ORAL
  Filled 2014-02-22 (×3): qty 1

## 2014-02-22 MED ORDER — DIAZEPAM 5 MG PO TABS
5.0000 mg | ORAL_TABLET | Freq: Four times a day (QID) | ORAL | Status: DC | PRN
  Administered 2014-02-22 (×3): 5 mg via ORAL
  Filled 2014-02-22: qty 1
  Filled 2014-02-22: qty 2

## 2014-02-22 MED ORDER — FLUTICASONE PROPIONATE 50 MCG/ACT NA SUSP
2.0000 | Freq: Every day | NASAL | Status: DC
  Administered 2014-02-22: 2 via NASAL
  Filled 2014-02-22: qty 16

## 2014-02-22 MED ORDER — HYDROMORPHONE HCL 1 MG/ML IJ SOLN
0.2500 mg | INTRAMUSCULAR | Status: DC | PRN
  Administered 2014-02-22 (×4): 0.5 mg via INTRAVENOUS

## 2014-02-22 MED ORDER — OXYCODONE-ACETAMINOPHEN 5-325 MG PO TABS
1.0000 | ORAL_TABLET | ORAL | Status: DC | PRN
  Administered 2014-02-22 – 2014-02-23 (×5): 2 via ORAL
  Filled 2014-02-22 (×6): qty 2

## 2014-02-22 MED ORDER — SODIUM CHLORIDE 0.9 % IR SOLN
Status: DC | PRN
  Administered 2014-02-22: 500 mL

## 2014-02-22 MED ORDER — ALPRAZOLAM 0.5 MG PO TABS: 0.5000 mg | ORAL_TABLET | Freq: Three times a day (TID) | ORAL | Status: DC | PRN

## 2014-02-22 MED ORDER — SODIUM CHLORIDE 0.9 % IJ SOLN
3.0000 mL | Freq: Two times a day (BID) | INTRAMUSCULAR | Status: DC
  Administered 2014-02-22 – 2014-02-23 (×2): 3 mL via INTRAVENOUS

## 2014-02-22 MED ORDER — SIMVASTATIN 20 MG PO TABS
20.0000 mg | ORAL_TABLET | Freq: Every evening | ORAL | Status: DC
  Administered 2014-02-22: 20 mg via ORAL
  Filled 2014-02-22: qty 1

## 2014-02-22 MED ORDER — LIDOCAINE HCL (CARDIAC) 20 MG/ML IV SOLN
INTRAVENOUS | Status: DC | PRN
  Administered 2014-02-22: 50 mg via INTRAVENOUS

## 2014-02-22 MED ORDER — OXYMETAZOLINE HCL 0.05 % NA SOLN
2.0000 | Freq: Two times a day (BID) | NASAL | Status: DC | PRN
  Administered 2014-02-22 – 2014-02-23 (×2): 2 via NASAL
  Filled 2014-02-22 (×2): qty 15

## 2014-02-22 MED ORDER — GLYCOPYRROLATE 0.2 MG/ML IJ SOLN
INTRAMUSCULAR | Status: DC | PRN
  Administered 2014-02-22: 0.6 mg via INTRAVENOUS

## 2014-02-22 MED ORDER — PSEUDOEPHEDRINE HCL 30 MG PO TABS
30.0000 mg | ORAL_TABLET | Freq: Four times a day (QID) | ORAL | Status: DC | PRN
  Administered 2014-02-23: 30 mg via ORAL
  Filled 2014-02-22 (×2): qty 1

## 2014-02-22 MED ORDER — LACTATED RINGERS IV SOLN
INTRAVENOUS | Status: DC | PRN
  Administered 2014-02-22 (×2): via INTRAVENOUS

## 2014-02-22 MED ORDER — PHENYLEPHRINE 40 MCG/ML (10ML) SYRINGE FOR IV PUSH (FOR BLOOD PRESSURE SUPPORT)
PREFILLED_SYRINGE | INTRAVENOUS | Status: AC
Start: 2014-02-22 — End: 2014-02-22
  Filled 2014-02-22: qty 10

## 2014-02-22 MED ORDER — PANTOPRAZOLE SODIUM 40 MG PO TBEC
40.0000 mg | DELAYED_RELEASE_TABLET | Freq: Every day | ORAL | Status: DC
  Administered 2014-02-22 – 2014-02-23 (×2): 40 mg via ORAL
  Filled 2014-02-22 (×2): qty 1

## 2014-02-22 MED ORDER — EPHEDRINE SULFATE 50 MG/ML IJ SOLN
INTRAMUSCULAR | Status: AC
  Filled 2014-02-22: qty 1

## 2014-02-22 MED ORDER — ACETAMINOPHEN 650 MG RE SUPP: 650.0000 mg | RECTAL | Status: DC | PRN

## 2014-02-22 MED ORDER — NEOSTIGMINE METHYLSULFATE 10 MG/10ML IV SOLN
INTRAVENOUS | Status: DC | PRN
Start: 2014-02-22 — End: 2014-02-22
  Administered 2014-02-22: 4 mg via INTRAVENOUS

## 2014-02-22 MED ORDER — SODIUM CHLORIDE 0.9 % IJ SOLN
INTRAMUSCULAR | Status: AC
  Filled 2014-02-22: qty 10

## 2014-02-22 MED ORDER — ONDANSETRON HCL 4 MG/2ML IJ SOLN
4.0000 mg | INTRAMUSCULAR | Status: DC | PRN
Start: 2014-02-22 — End: 2014-02-23

## 2014-02-22 MED ORDER — KETOROLAC TROMETHAMINE 30 MG/ML IJ SOLN
30.0000 mg | Freq: Four times a day (QID) | INTRAMUSCULAR | Status: DC
  Administered 2014-02-22 – 2014-02-23 (×4): 30 mg via INTRAVENOUS
  Filled 2014-02-22 (×4): qty 1

## 2014-02-22 MED ORDER — CEFAZOLIN SODIUM 1-5 GM-% IV SOLN
1.0000 g | Freq: Three times a day (TID) | INTRAVENOUS | Status: AC
  Administered 2014-02-22 (×2): 1 g via INTRAVENOUS
  Filled 2014-02-22 (×3): qty 50

## 2014-02-22 MED ORDER — PHENOL 1.4 % MT LIQD: 1.0000 | OROMUCOSAL | Status: DC | PRN

## 2014-02-22 MED ORDER — ROCURONIUM BROMIDE 100 MG/10ML IV SOLN
INTRAVENOUS | Status: DC | PRN
  Administered 2014-02-22: 5 mg via INTRAVENOUS
  Administered 2014-02-22: 10 mg via INTRAVENOUS
  Administered 2014-02-22: 50 mg via INTRAVENOUS

## 2014-02-22 MED ORDER — SPIRONOLACTONE 25 MG PO TABS
12.5000 mg | ORAL_TABLET | Freq: Every evening | ORAL | Status: DC
  Administered 2014-02-22: 12.5 mg via ORAL
  Filled 2014-02-22: qty 1

## 2014-02-22 MED ORDER — 0.9 % SODIUM CHLORIDE (POUR BTL) OPTIME
TOPICAL | Status: DC | PRN
  Administered 2014-02-22: 1000 mL

## 2014-02-22 MED ORDER — LATANOPROST 0.005 % OP SOLN
1.0000 [drp] | Freq: Every day | OPHTHALMIC | Status: DC
  Administered 2014-02-22: 1 [drp] via OPHTHALMIC
  Filled 2014-02-22: qty 2.5

## 2014-02-22 MED ORDER — ACETAMINOPHEN 325 MG PO TABS: 650.0000 mg | ORAL_TABLET | ORAL | Status: DC | PRN

## 2014-02-22 MED ORDER — DOCUSATE SODIUM 100 MG PO CAPS
100.0000 mg | ORAL_CAPSULE | Freq: Two times a day (BID) | ORAL | Status: DC
  Administered 2014-02-22 – 2014-02-23 (×3): 100 mg via ORAL
  Filled 2014-02-22 (×3): qty 1

## 2014-02-22 MED ORDER — PROMETHAZINE HCL 25 MG/ML IJ SOLN
6.2500 mg | INTRAMUSCULAR | Status: DC | PRN
Start: 2014-02-22 — End: 2014-02-22

## 2014-02-22 MED ORDER — HYDROCODONE-ACETAMINOPHEN 5-325 MG PO TABS: 1.0000 | ORAL_TABLET | ORAL | Status: DC | PRN

## 2014-02-22 MED ORDER — ALBUTEROL SULFATE (2.5 MG/3ML) 0.083% IN NEBU
1.0000 mL | INHALATION_SOLUTION | Freq: Four times a day (QID) | RESPIRATORY_TRACT | Status: DC | PRN
Start: 2014-02-22 — End: 2014-02-23

## 2014-02-22 MED ORDER — SODIUM CHLORIDE 0.9 % IV SOLN: 250.0000 mL | INTRAVENOUS | Status: DC

## 2014-02-22 SURGICAL SUPPLY — 64 items
APL SKNCLS STERI-STRIP NONHPOA (GAUZE/BANDAGES/DRESSINGS) ×1
BAG DECANTER FOR FLEXI CONT (MISCELLANEOUS) ×3 IMPLANT
BENZOIN TINCTURE PRP APPL 2/3 (GAUZE/BANDAGES/DRESSINGS) ×3 IMPLANT
BLADE CLIPPER SURG (BLADE) ×2 IMPLANT
BRUSH SCRUB EZ PLAIN DRY (MISCELLANEOUS) ×3 IMPLANT
BUR MATCHSTICK NEURO 3.0 LAGG (BURR) ×3 IMPLANT
CAGE CAPSTONE 10X22X6 (Cage) ×4 IMPLANT
CANISTER SUCT 3000ML PPV (MISCELLANEOUS) ×3 IMPLANT
CAP LCK SPNE (Orthopedic Implant) ×4 IMPLANT
CAP LOCK SPINE RADIUS (Orthopedic Implant) IMPLANT
CAP LOCKING (Orthopedic Implant) ×12 IMPLANT
CLOSURE WOUND 1/2 X4 (GAUZE/BANDAGES/DRESSINGS) ×2
CONT SPEC 4OZ CLIKSEAL STRL BL (MISCELLANEOUS) ×6 IMPLANT
COVER BACK TABLE 60X90IN (DRAPES) ×3 IMPLANT
DECANTER SPIKE VIAL GLASS SM (MISCELLANEOUS) ×1 IMPLANT
DRAPE C-ARM 42X72 X-RAY (DRAPES) ×6 IMPLANT
DRAPE LAPAROTOMY 100X72X124 (DRAPES) ×3 IMPLANT
DRAPE POUCH INSTRU U-SHP 10X18 (DRAPES) ×3 IMPLANT
DRAPE PROXIMA HALF (DRAPES) IMPLANT
DRAPE SURG 17X23 STRL (DRAPES) ×12 IMPLANT
DRSG OPSITE POSTOP 4X8 (GAUZE/BANDAGES/DRESSINGS) ×2 IMPLANT
DURAPREP 26ML APPLICATOR (WOUND CARE) ×3 IMPLANT
ELECT REM PT RETURN 9FT ADLT (ELECTROSURGICAL) ×3
ELECTRODE REM PT RTRN 9FT ADLT (ELECTROSURGICAL) ×1 IMPLANT
EVACUATOR 1/8 PVC DRAIN (DRAIN) ×1 IMPLANT
GAUZE SPONGE 4X4 12PLY STRL (GAUZE/BANDAGES/DRESSINGS) ×1 IMPLANT
GAUZE SPONGE 4X4 16PLY XRAY LF (GAUZE/BANDAGES/DRESSINGS) IMPLANT
GLOVE BIO SURGEON STRL SZ7 (GLOVE) ×4 IMPLANT
GLOVE BIO SURGEON STRL SZ8 (GLOVE) ×2 IMPLANT
GLOVE BIOGEL PI IND STRL 7.0 (GLOVE) IMPLANT
GLOVE BIOGEL PI INDICATOR 7.0 (GLOVE) ×4
GLOVE ECLIPSE 6.5 STRL STRAW (GLOVE) ×6 IMPLANT
GLOVE ECLIPSE 9.0 STRL (GLOVE) ×6 IMPLANT
GLOVE EXAM NITRILE LRG STRL (GLOVE) IMPLANT
GLOVE EXAM NITRILE MD LF STRL (GLOVE) IMPLANT
GLOVE EXAM NITRILE XL STR (GLOVE) IMPLANT
GLOVE EXAM NITRILE XS STR PU (GLOVE) IMPLANT
GLOVE INDICATOR 8.5 STRL (GLOVE) ×2 IMPLANT
GOWN STRL REUS W/ TWL LRG LVL3 (GOWN DISPOSABLE) IMPLANT
GOWN STRL REUS W/ TWL XL LVL3 (GOWN DISPOSABLE) ×2 IMPLANT
GOWN STRL REUS W/TWL 2XL LVL3 (GOWN DISPOSABLE) IMPLANT
GOWN STRL REUS W/TWL LRG LVL3 (GOWN DISPOSABLE) ×3
GOWN STRL REUS W/TWL XL LVL3 (GOWN DISPOSABLE) ×9
KIT BASIN OR (CUSTOM PROCEDURE TRAY) ×3 IMPLANT
KIT ROOM TURNOVER OR (KITS) ×3 IMPLANT
LIQUID BAND (GAUZE/BANDAGES/DRESSINGS) ×3 IMPLANT
NEEDLE HYPO 22GX1.5 SAFETY (NEEDLE) ×3 IMPLANT
NS IRRIG 1000ML POUR BTL (IV SOLUTION) ×3 IMPLANT
PACK LAMINECTOMY NEURO (CUSTOM PROCEDURE TRAY) ×3 IMPLANT
ROD RADIUS 45MM (Rod) ×6 IMPLANT
ROD SPNL 45X5.5XNS TI RDS (Rod) IMPLANT
SCREW 6.75X45MM (Screw) ×4 IMPLANT
SENSORCAINE 0.25% ×2 IMPLANT
SPONGE SURGIFOAM ABS GEL 100 (HEMOSTASIS) ×3 IMPLANT
STRIP CLOSURE SKIN 1/2X4 (GAUZE/BANDAGES/DRESSINGS) ×4 IMPLANT
SUT VIC AB 0 CT1 18XCR BRD8 (SUTURE) ×2 IMPLANT
SUT VIC AB 0 CT1 8-18 (SUTURE) ×3
SUT VIC AB 2-0 CT1 18 (SUTURE) ×3 IMPLANT
SUT VIC AB 3-0 SH 8-18 (SUTURE) ×6 IMPLANT
SYR 20ML ECCENTRIC (SYRINGE) ×3 IMPLANT
TOWEL OR 17X24 6PK STRL BLUE (TOWEL DISPOSABLE) ×3 IMPLANT
TOWEL OR 17X26 10 PK STRL BLUE (TOWEL DISPOSABLE) ×3 IMPLANT
TRAY FOLEY CATH 14FRSI W/METER (CATHETERS) ×1 IMPLANT
WATER STERILE IRR 1000ML POUR (IV SOLUTION) ×3 IMPLANT

## 2014-02-22 NOTE — Anesthesia Procedure Notes (Signed)
Procedure Name: Intubation Date/Time: 02/22/2014 7:52 AM Performed by: Suzy Bouchard Pre-anesthesia Checklist: Timeout performed, Emergency Drugs available, Suction available, Patient being monitored and Patient identified Patient Re-evaluated:Patient Re-evaluated prior to inductionOxygen Delivery Method: Circle system utilized Preoxygenation: Pre-oxygenation with 100% oxygen Intubation Type: IV induction Ventilation: Mask ventilation without difficulty Laryngoscope Size: Miller and 2 Grade View: Grade I Tube type: Oral Tube size: 7.5 mm Number of attempts: 1 Airway Equipment and Method: Stylet Placement Confirmation: ETT inserted through vocal cords under direct vision,  breath sounds checked- equal and bilateral and positive ETCO2 Secured at: 22 cm Tube secured with: Tape Dental Injury: Teeth and Oropharynx as per pre-operative assessment

## 2014-02-22 NOTE — Anesthesia Preprocedure Evaluation (Addendum)
Anesthesia Evaluation  Patient identified by MRN, date of birth, ID band Patient awake  General Assessment Comment:Past Medical History   Diagnosis  Date   .  Hypertension     .  Heart murmur     .  GERD (gastroesophageal reflux disease)     .  H/O hiatal hernia     .  Arthritis     .  BPH (benign prostatic hyperplasia)     .  H/O wheezing         occ inhaler usage   .  Meningioma     Reviewed: Allergy & Precautions, H&P , NPO status , Patient's Chart, lab work & pertinent test results, reviewed documented beta blocker date and time   Airway Mallampati: II  TM Distance: >3 FB Neck ROM: Full    Dental  (+) Dental Advisory Given Temporary crown left upper side, with permanent cement placed yesterday.:   Pulmonary COPDformer smoker,  breath sounds clear to auscultation  Pulmonary exam normal       Cardiovascular Exercise Tolerance: Good hypertension, Pt. on medications and Pt. on home beta blockers negative cardio ROS  + Valvular Problems/Murmurs Rhythm:Regular Rate:Normal     Neuro/Psych negative neurological ROS  negative psych ROS   GI/Hepatic Neg liver ROS, hiatal hernia, GERD-  Medicated,  Endo/Other  negative endocrine ROS  Renal/GU negative Renal ROS     Musculoskeletal  (+) Arthritis -,   Abdominal (+) + obese,   Peds  Hematology negative hematology ROS (+)   Anesthesia Other Findings Meningioma-  S/p radiation  Reproductive/Obstetrics negative OB ROS                           Anesthesia Physical  Anesthesia Plan  ASA: II  Anesthesia Plan: General   Post-op Pain Management:    Induction: Intravenous  Airway Management Planned: Oral ETT  Additional Equipment:   Intra-op Plan:   Post-operative Plan: Extubation in OR  Informed Consent: I have reviewed the patients History and Physical, chart, labs and discussed the procedure including the risks, benefits and  alternatives for the proposed anesthesia with the patient or authorized representative who has indicated his/her understanding and acceptance.   Dental advisory given  Plan Discussed with: CRNA  Anesthesia Plan Comments:        Anesthesia Quick Evaluation

## 2014-02-22 NOTE — Transfer of Care (Signed)
Immediate Anesthesia Transfer of Care Note  Patient: Cody Tate  Procedure(s) Performed: Procedure(s): POSTERIOR LUMBAR INTERBODY FUSION LUMBAR ONE-TWO (N/A)  Patient Location: PACU  Anesthesia Type:General  Level of Consciousness: awake and alert   Airway & Oxygen Therapy: Patient Spontanous Breathing and Patient connected to nasal cannula oxygen  Post-op Assessment: Report given to RN, Post -op Vital signs reviewed and stable and Patient moving all extremities  Post vital signs: Reviewed and stable  Last Vitals:  Filed Vitals:   02/22/14 0619  BP: 119/66  Pulse: 76  Temp: 36.6 C  Resp: 20    Complications: No apparent anesthesia complications

## 2014-02-22 NOTE — Anesthesia Postprocedure Evaluation (Signed)
Anesthesia Post Note  Patient: Cody Tate  Procedure(s) Performed: Procedure(s) (LRB): POSTERIOR LUMBAR INTERBODY FUSION LUMBAR ONE-TWO (N/A)  Anesthesia type: General  Patient location: PACU  Post pain: Pain level controlled  Post assessment: Post-op Vital signs reviewed  Last Vitals: BP 131/67 mmHg  Pulse 78  Temp(Src) 36.5 C (Oral)  Resp 18  Ht 5' 6.5" (1.689 m)  Wt 215 lb 12 oz (97.864 kg)  BMI 34.31 kg/m2  SpO2 99%  Post vital signs: Reviewed  Level of consciousness: sedated  Complications: No apparent anesthesia complications\

## 2014-02-22 NOTE — Op Note (Signed)
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  L1-L2 posterior lumbar interbody fusion utilizing interbody peek cages and locally harvested autograft  L1-2 posterior lateral arthrodesis utilizing nonsegmental pedicle screw fixation  Removal of previous L2-L3 instrumentation  Surgeon:Ramie Erman A.Shantrell Placzek, M.D.  Asst. Surgeon: Saintclair Halsted  Anesthesia: General  Indication: 64 year old male status post previous L2-L5 decompression infusion with recently good result presents now with worsening back and bilateral lower extremity pain and weakness right greater than left. Workup demonstrates evidence of marked disc degeneration with a large right L1-L2 disc herniation causing marked stenosis. Patient presents now for L1-2 decompression infusion in hopes of improving his symptoms.  Operative note: After induction anesthesia, patient position prone onto Wilson frame and a properly padded. Lumbar region prepped and draped. Incision made overlying L1-3. Dissection performed bilaterally. Lamina and facet joints of L1 and L2 as well as the transverse processes of L1 and L2 were dissected free. Previous pedicle screw instrumentation at L2-3 was also dissected free. Construct was disassembled. Hardware was removed. Screws into L2 were removed left in place however. Decompressive laminectomies and performed using Leksell rongeurs Kerrison rongeurs to remove the entire lamina of L1 entire inferior facets of L1 bilaterally. The majority the superior facets of L2 bilaterally. Ligament  flavum and epidural scar were elevated and resected in a piecemeal fashion. Wide decompressive foraminotomies were performed on course exiting L1 and L2 nerve roots bilaterally. Epidural venous plexus quite related and cut. Bilateral discectomies were then performed without difficulty. A large amount of inferiorly migrated disc herniation was resected along during this step. Disc spaces and distracted up to 10 mm. With a 10 mm distractor left and patient's left side. Thecal sac and nerve roots were protected on the right side. Disc space was reamed and scraped with various curettes. Soft tissues removed and interspace. A 10 x 22 x 6 Med[employer]onic cage was then packed with morselized autograft impacted into position and rotated into final decision. Distractor was removed patient's left side. Thecal sac and nerve respect on the left side. Once again disc space was prepared. Morselize autograft was then packed and interspace. A second cage was then impacted into place and rotated into position. Pedicles of L1 were then identified using surface landmarks and intraoperative fluoroscopy. Sufficient bone was removed overlying the pedicle using high-speed drill. Each pedicles and probed using a pedicle awl. Each pedicle awl track was probed and found to be solidly within the bone. Each pedicle awl track was then tapped with a screw tap and then 6.75 x 45 mm radius brand screws from [employer] were placed bilaterally. Transverse processes of L1 and L2 were decorticated E high-speed drill. Morselized autograft was packed posterior laterally for later fusion. Short segment of titanium rod was then contoured and placed over the screws at L1 and L2. Locking caps and placed over the screws. Locking caps and engaged with the construct under compression. Final images revealed good position the bone graft and hardware at proper upper level with normal alignment of the spine. Wound is then irrigated one final time. Vancomycin  powder was placed the deep wound space. Wounds and close in layers of Vicryl sutures. Steri-Strips  and sterile dressing were applied. There were no apparent complications. Patient tolerated the procedure well and he returns to the recovery room postop.

## 2014-02-22 NOTE — H&P (Signed)
Cody Tate is an 64 y.o. male.   Chief Complaint: Back and bilateral leg pain HPI: 64 year old male status post multiple previous lumbar decompressions and fusions most recent at L2-3 presents with back and bilateral lower extremity symptoms failing conservative management. Workup demonstrates evidence of adjacent level breakdown with disc herniation at L1-2 with resultant stenosis. Patient's failed conservative management and presents now for L1-2 decompression infusion in hopes of improving his symptoms.  Past Medical History  Diagnosis Date  . Hypertension   . Heart murmur   . GERD (gastroesophageal reflux disease)   . H/O hiatal hernia   . Arthritis   . BPH (benign prostatic hyperplasia)   . H/O wheezing     occ inhaler usage  . Meningioma   . COPD (chronic obstructive pulmonary disease)   . Enlarged prostate     Past Surgical History  Procedure Laterality Date  . Back surgery  06,07,08    lam x3 cerv x 1  . Wrist surgery  09    cyst lft  . Sinus exploration    . Knee arthroscopy  10    lft  . Eye surgery      bil  . Pilonidal cyst excision    . Wrist ganglion excision      rt  . Cystoscopy      64 yrs old  . Knee arthroscopy Left 07/23/2012    Procedure: LEFT MEDIAL MENISCAL DEBRIDEMENT AND CHONDROPLASTY;  Surgeon: [name], MD;  Location: [facility];  Service: Orthopedics;  Laterality: Left;  . Cervical fusion      History reviewed. No pertinent family history. Social History:  reports that he quit smoking about 13 years ago. His smoking use included Cigarettes. He smoked 2.00 packs per day for 0 years. He has never used smokeless tobacco. He reports that he does not drink alcohol or use illicit drugs.  Allergies:  Allergies  Allergen Reactions  . Adhesive [Tape] Itching and Rash    Occlusive tape and bandaids  . Other Itching and Rash    VICRYL Sutures    Medications Prior to Admission  Medication Sig Dispense Refill  . albuterol (PROVENTIL  HFA;VENTOLIN HFA) 108 (90 BASE) MCG/ACT inhaler Inhale into the lungs every 6 (six) hours as needed for wheezing or shortness of breath.    . ALPRAZolam (XANAX) 0.25 MG tablet Take 2 tablets (0.5 mg total) by mouth 3 (three) times daily as needed for sleep (20 min before MRI or radiation appointment). 30 tablet 0  . diphenhydrAMINE (SOMINEX) 25 MG tablet Take 25 mg by mouth at bedtime as needed for sleep.     . fluticasone (FLONASE) 50 MCG/ACT nasal spray Place 2 sprays into both nostrils at bedtime.     Marland Kitchen guaifenesin (HUMIBID E) 400 MG TABS Take 400 mg by mouth every 6 (six) hours as needed (for congestion). For mucus    . hydrochlorothiazide (HYDRODIURIL) 25 MG tablet Take 25 mg by mouth every morning.     Marland Kitchen ibuprofen (ADVIL,MOTRIN) 200 MG tablet Take 600 mg by mouth every 6 (six) hours as needed for pain. For pain    . latanoprost (XALATAN) 0.005 % ophthalmic solution Place 1 drop into both eyes at bedtime.     . magnesium hydroxide (MILK OF MAGNESIA) 400 MG/5ML suspension Take 15 mLs by mouth at bedtime as needed for constipation. For constipation    . metoprolol succinate (TOPROL-XL) 100 MG 24 hr tablet Take 100 mg by mouth every evening. Take with  or immediately following a meal.    . omeprazole (PRILOSEC) 40 MG capsule Take 40 mg by mouth every morning.    Marland Kitchen oxyCODONE-acetaminophen (PERCOCET/ROXICET) 5-325 MG per tablet Take 2 tablets by mouth every 4 (four) hours as needed for moderate pain.     Marland Kitchen oxymetazoline (AFRIN) 0.05 % nasal spray Place 2 sprays into both nostrils 2 (two) times daily as needed for congestion.    . pregabalin (LYRICA) 75 MG capsule Take 75 mg by mouth 3 (three) times daily.     . pseudoephedrine (SUDAFED) 30 MG tablet Take 30 mg by mouth every 6 (six) hours as needed. For congestion    . ramipril (ALTACE) 10 MG capsule Take 10 mg by mouth every morning.     . Simethicone (GAS-X EXTRA STRENGTH PO) Take 1 tablet by mouth 4 (four) times daily as needed (for gas). For upset  stomach    . simvastatin (ZOCOR) 20 MG tablet Take 20 mg by mouth every evening.    Marland Kitchen spironolactone (ALDACTONE) 25 MG tablet Take 12.5 mg by mouth every evening.     . Tamsulosin HCl (FLOMAX) 0.4 MG CAPS Take 0.4 mg by mouth 2 (two) times daily.    . methocarbamol (ROBAXIN) 500 MG tablet Take 1 tablet (500 mg total) by mouth 4 (four) times daily. As needed for muscle spasm (Patient not taking: Reported on 02/09/2014) 30 tablet 1    No results found for this or any previous visit (from the past 48 hour(s)). No results found.  Review of Systems  Constitutional: Negative.   HENT: Negative.   Eyes: Negative.   Respiratory: Negative.   Cardiovascular: Negative.   Gastrointestinal: Negative.   Genitourinary: Negative.   Musculoskeletal: Negative.   Skin: Negative.   Neurological: Negative.   Endo/Heme/Allergies: Negative.   Psychiatric/Behavioral: Negative.     Blood pressure 119/66, pulse 76, temperature 97.8 F (36.6 C), temperature source Oral, resp. rate 20, height 5' 6.5" (1.689 m), weight 97.864 kg (215 lb 12 oz), SpO2 96 %. Physical Exam  Constitutional: He is oriented to person, place, and time. He appears well-developed and well-nourished. No distress.  HENT:  Head: Normocephalic and atraumatic.  Right Ear: External ear normal.  Left Ear: External ear normal.  Nose: Nose normal.  Mouth/Throat: Oropharynx is clear and moist. No oropharyngeal exudate.  Eyes: Conjunctivae and EOM are normal. Pupils are equal, round, and reactive to light. Right eye exhibits no discharge. Left eye exhibits no discharge.  Neck: Normal range of motion. Neck supple. No tracheal deviation present. No thyromegaly present.  Cardiovascular: Normal rate, regular rhythm, normal heart sounds and intact distal pulses.  Exam reveals no friction rub.   No murmur heard. Respiratory: Effort normal and breath sounds normal. No respiratory distress. He has no wheezes.  GI: Soft. Bowel sounds are normal. He  exhibits no distension. There is no tenderness.  Musculoskeletal: Normal range of motion. He exhibits no edema or tenderness.  Neurological: He is alert and oriented to person, place, and time. He has normal reflexes. He displays normal reflexes. No cranial nerve deficit. He exhibits normal muscle tone. Coordination normal.  Skin: Skin is warm and dry. No rash noted. He is not diaphoretic. No erythema. No pallor.  Psychiatric: He has a normal mood and affect. His behavior is normal. Judgment and thought content normal.     Assessment/Plan L1-2 adjacent level degeneration with disc herniation and stenosis. Plan L1-2 decompressive laminectomy with foraminotomies followed by posterior lumbar my fusion utilizing interbody  cage, locally harvested autograft, and augmented with posterior lateral arthrodesis utilizing nonsegmental pedicle screw fixation. Risks and benefits of been explained. Patient wishes to proceed.  Yosef Krogh A 02/22/2014, 7:35 AM

## 2014-02-22 NOTE — Brief Op Note (Signed)
02/22/2014  10:06 AM  PATIENT:  Cody Tate  64 y.o. male  PRE-OPERATIVE DIAGNOSIS:  HNP/stenosis  POST-OPERATIVE DIAGNOSIS:  HERNIATED NUCLEOUS PULPOSUS/STENOSIS  PROCEDURE:  Procedure(s): POSTERIOR LUMBAR INTERBODY FUSION LUMBAR ONE-TWO (N/A)  SURGEON:  Surgeon(s) and Role:    * Charlie Pitter, MD - Primary    * Elaina Hoops, MD - Assisting  PHYSICIAN ASSISTANT:   ASSISTANTS:    ANESTHESIA:   general  EBL:  Total I/O In: 1120 [I.V.:1000; Blood:120] Out: 500 [Urine:150; Blood:350]  BLOOD ADMINISTERED:none  DRAINS: none   LOCAL MEDICATIONS USED:  MARCAINE     SPECIMEN:  No Specimen  DISPOSITION OF SPECIMEN:  N/A  COUNTS:  YES  TOURNIQUET:  * No tourniquets in log *  DICTATION: .Dragon Dictation  PLAN OF CARE: Admit to inpatient   PATIENT DISPOSITION:  PACU - hemodynamically stable.   Delay start of Pharmacological VTE agent (>24hrs) due to surgical blood loss or risk of bleeding: yes

## 2014-02-23 MED ORDER — OXYCODONE-ACETAMINOPHEN 5-325 MG PO TABS: 1.0000 | ORAL_TABLET | ORAL | Status: DC | PRN

## 2014-02-23 MED ORDER — DIAZEPAM 5 MG PO TABS: 5.0000 mg | ORAL_TABLET | Freq: Four times a day (QID) | ORAL | Status: DC | PRN

## 2014-02-23 MED ORDER — METHYLPREDNISOLONE 4 MG PO KIT: PACK | ORAL | Status: DC

## 2014-02-23 NOTE — Discharge Summary (Signed)
Physician Discharge Summary  Patient ID: Cody Tate MRN: [id number] DOB/AGE: Aug 20, 1950 64 y.o.  Admit date: 02/22/2014 Discharge date: 02/23/2014  Admission Diagnoses:  Discharge Diagnoses:  Active Problems:   Lumbar disc herniation with radiculopathy   Discharged Condition: good  Hospital Course: The patient was admitted to the hospital where he underwent an uncomplicated H7-3 decompression and fusion. Postoperatively he is doing well. He did awaken with some left lateral leg pain which was not associated with any motor or sensory findings. The symptoms improved greatly overnight. Currently is having minimal discomfort. He is up ambulating. He is voiding without difficulty and feels ready to go home.  Consults:   Significant Diagnostic Studies:   Treatments:   Discharge Exam: Blood pressure 126/67, pulse 90, temperature 98.2 F (36.8 C), temperature source Oral, resp. rate 20, height 5' 6.5" (1.689 m), weight 97.864 kg (215 lb 12 oz), SpO2 100 %. Awake and alert. Oriented and appropriate. Motor and sensory function intact. Wound clean and dry. Chest and abdomen benign.  Disposition: 01-Home or Self Care     Medication List    TAKE these medications        albuterol 108 (90 BASE) MCG/ACT inhaler  Commonly known as:  PROVENTIL HFA;VENTOLIN HFA  Inhale into the lungs every 6 (six) hours as needed for wheezing or shortness of breath.     ALPRAZolam 0.25 MG tablet  Commonly known as:  XANAX  Take 2 tablets (0.5 mg total) by mouth 3 (three) times daily as needed for sleep (20 min before MRI or radiation appointment).     diazepam 5 MG tablet  Commonly known as:  VALIUM  Take 1-2 tablets (5-10 mg total) by mouth every 6 (six) hours as needed for muscle spasms.     diphenhydrAMINE 25 MG tablet  Commonly known as:  SOMINEX  Take 25 mg by mouth at bedtime as needed for sleep.     fluticasone 50 MCG/ACT nasal spray  Commonly known as:  FLONASE  Place 2 sprays into  both nostrils at bedtime.     GAS-X EXTRA STRENGTH PO  Take 1 tablet by mouth 4 (four) times daily as needed (for gas). For upset stomach     guaifenesin 400 MG Tabs tablet  Commonly known as:  HUMIBID E  Take 400 mg by mouth every 6 (six) hours as needed (for congestion). For mucus     hydrochlorothiazide 25 MG tablet  Commonly known as:  HYDRODIURIL  Take 25 mg by mouth every morning.     ibuprofen 200 MG tablet  Commonly known as:  ADVIL,MOTRIN  Take 600 mg by mouth every 6 (six) hours as needed for pain. For pain     latanoprost 0.005 % ophthalmic solution  Commonly known as:  XALATAN  Place 1 drop into both eyes at bedtime.     magnesium hydroxide 400 MG/5ML suspension  Commonly known as:  MILK OF MAGNESIA  Take 15 mLs by mouth at bedtime as needed for constipation. For constipation     methocarbamol 500 MG tablet  Commonly known as:  ROBAXIN  Take 1 tablet (500 mg total) by mouth 4 (four) times daily. As needed for muscle spasm     methylPREDNISolone 4 MG tablet  Commonly known as:  MEDROL DOSEPAK  follow package directions     metoprolol succinate 100 MG 24 hr tablet  Commonly known as:  TOPROL-XL  Take 100 mg by mouth every evening. Take with or immediately following a meal.  omeprazole 40 MG capsule  Commonly known as:  PRILOSEC  Take 40 mg by mouth every morning.     oxyCODONE-acetaminophen 5-325 MG per tablet  Commonly known as:  PERCOCET/ROXICET  Take 2 tablets by mouth every 4 (four) hours as needed for moderate pain.     oxyCODONE-acetaminophen 5-325 MG per tablet  Commonly known as:  PERCOCET/ROXICET  Take 1-2 tablets by mouth every 4 (four) hours as needed for moderate pain.     oxymetazoline 0.05 % nasal spray  Commonly known as:  AFRIN  Place 2 sprays into both nostrils 2 (two) times daily as needed for congestion.     pregabalin 75 MG capsule  Commonly known as:  LYRICA  Take 75 mg by mouth 3 (three) times daily.     pseudoephedrine 30 MG  tablet  Commonly known as:  SUDAFED  Take 30 mg by mouth every 6 (six) hours as needed. For congestion     ramipril 10 MG capsule  Commonly known as:  ALTACE  Take 10 mg by mouth every morning.     simvastatin 20 MG tablet  Commonly known as:  ZOCOR  Take 20 mg by mouth every evening.     spironolactone 25 MG tablet  Commonly known as:  ALDACTONE  Take 12.5 mg by mouth every evening.     tamsulosin 0.4 MG Caps capsule  Commonly known as:  FLOMAX  Take 0.4 mg by mouth 2 (two) times daily.           Follow-up Information    Follow up with Charlie Pitter, MD.   Specialty:  Neurosurgery   Contact information:   [address] [place] [address] [phone]       Signed: Charlie Pitter 02/23/2014, 12:39 PM

## 2014-02-23 NOTE — Evaluation (Signed)
Occupational Therapy Evaluation Patient Details Name: Cody Tate MRN: [id number] DOB: Dec 24, 1950 Today's Date: 02/23/2014    History of Present Illness s/p posterior lumbar fusion level 1; h/o arthritis, COPD, HTN; past surgical history: Lt wrist, Lt knee, back and cervical   Clinical Impression   Pt admitted with the above diagnoses and presents with below problem list. Pt will benefit from continued acute OT to address the below listed deficits and maximize independence with basic ADLs prior to d/c home with family. PTA pt was mod I with ADLs. Pt currently at min guard level for LB ADLs, and stand pivot transfers.Functional mobility to be assessed at next session although anticipate good progress with household distance ambulation.       Follow Up Recommendations  Supervision/Assistance - 24 hour;No OT follow up    Equipment Recommendations  3 in 1 bedside comode    Recommendations for Other Services       Precautions / Restrictions Precautions Precautions: Back Precaution Booklet Issued: No Precaution Comments: reviewed BAT precautions with pt and spouse Required Braces or Orthoses: Spinal Brace Spinal Brace: Applied in sitting position Restrictions Weight Bearing Restrictions: No      Mobility Bed Mobility Overal bed mobility: Needs Assistance Bed Mobility: Rolling;Sidelying to Sit Rolling: Min guard Sidelying to sit: Min guard       General bed mobility comments: Pt is familiar with technique from previous back surgeries. HOB flat. Min guard for safety.  Transfers Overall transfer level: Needs assistance Equipment used: Rolling walker (2 wheeled) Transfers: Sit to/from [facility] Sit to Stand: Min guard;From elevated surface Stand pivot transfers: Min guard;From elevated surface       General transfer comment: sit<>stand from EOB and from recliner    Balance Overall balance assessment: Needs assistance Sitting-balance support: No  upper extremity supported;Feet supported Sitting balance-Leahy Scale: Good Sitting balance - Comments: donned brace in sitting position   Standing balance support: Bilateral upper extremity supported;During functional activity Standing balance-Leahy Scale: Fair                              ADL Overall ADL's : Needs assistance/impaired Eating/Feeding: Set up;Sitting   Grooming: Set up;Sitting   Upper Body Bathing: Set up;Sitting   Lower Body Bathing: Min guard;Sit to/from stand;With adaptive equipment   Upper Body Dressing : Set up;Sitting   Lower Body Dressing: Min guard;Sit to/from stand;With adaptive equipment   Toilet Transfer: Min guard;Stand-pivot;BSC;RW   Toileting- Water quality scientist and Hygiene: Min guard;Sit to/from stand   Tub/ Shower Transfer: Min guard;Stand-pivot;Shower seat;Rolling walker     General ADL Comments: Pt practiced log rolling technique for bed mobility. Stand pivot to recliner at min guard level. Reviewed techniques and AE for safe completion of ADLs with back precautions. Pt has reacher at home; educated on using it to dress.     Vision     Perception     Praxis      Pertinent Vitals/Pain Pain Assessment: No/denies pain     Hand Dominance     Extremity/Trunk Assessment Upper Extremity Assessment Upper Extremity Assessment: Overall WFL for tasks assessed   Lower Extremity Assessment Lower Extremity Assessment: Defer to PT evaluation       Communication Communication Communication: No difficulties   Cognition Arousal/Alertness: Awake/alert Behavior During Therapy: WFL for tasks assessed/performed Overall Cognitive Status: Within Functional Limits for tasks assessed  General Comments       Exercises       Shoulder Instructions      Home Living Family/patient expects to be discharged to:: Private residence Living Arrangements: Spouse/significant other Available Help at Discharge:  Family;Available 24 hours/day Type of Home: House Home Access: Stairs to enter [employer] of Steps: 3 Entrance Stairs-Rails: Right Home Layout: One level     Bathroom Shower/Tub: Occupational psychologist: Handicapped height Bathroom Accessibility: Yes How Accessible: Accessible via walker Home Equipment: Walker - standard;Shower seat - built in;Grab bars - tub/shower;Adaptive equipment Adaptive Equipment: Reacher        Prior Functioning/Environment Level of Independence: Independent with assistive device(s)        Comments: used a cane     OT Diagnosis: Acute pain   OT Problem List: Impaired balance (sitting and/or standing);Decreased knowledge of use of DME or AE;Decreased knowledge of precautions;Pain   OT Treatment/Interventions: Self-care/ADL training;DME and/or AE instruction;Therapeutic activities;Patient/family education;Balance training    OT Goals(Current goals can be found in the care plan section) Acute Rehab OT Goals Patient Stated Goal: "to walk normally again" "to be able to travel" OT Goal Formulation: With patient/family Time For Goal Achievement: 03/02/14 Potential to Achieve Goals: Good ADL Goals Pt Will Perform Lower Body Bathing: with modified independence;with adaptive equipment;sit to/from stand Pt Will Perform Lower Body Dressing: with modified independence;with adaptive equipment;sit to/from stand Pt Will Transfer to Toilet: with modified independence;ambulating (3n1 over toilet) Pt Will Perform Toileting - Clothing Manipulation and hygiene: with modified independence;sit to/from stand Pt Will Perform Tub/Shower Transfer: with modified independence;ambulating;shower seat;3 in 1;rolling walker  OT Frequency: Min 3X/week   Barriers to D/C:            Co-evaluation              End of Session Equipment Utilized During Treatment: Rolling walker;Back brace  Activity Tolerance: Patient tolerated treatment well Patient  left: in chair;with call bell/phone within reach;with family/visitor present;with chair alarm set   Time: [phone] OT Time Calculation (min): 31 min Charges:  OT General Charges $OT Visit: 1 Procedure OT Evaluation $Initial OT Evaluation Tier I: 1 Procedure OT Treatments $Self Care/Home Management : 8-22 mins G-Codes:    Hortencia Pilar 2014/03/11, 10:22 AM

## 2014-02-23 NOTE — Evaluation (Signed)
Physical Therapy Evaluation Patient Details Name: Cody Tate MRN: [id number] DOB: July 17, 1950 Today's Date: 02/23/2014   History of Present Illness  s/p posterior lumbar fusion level 1; h/o arthritis, COPD, HTN; past surgical history: Lt wrist, Lt knee, back and cervical  Clinical Impression  Patient presents with pain, generalized weakness and balance deficits impacting safe mobility. Pt tolerated ambulation with Min guard assist for safety. Education provided on back precautions. Pt would benefit from skilled PT to improve transfers, gait, balance and mobility so pt can maximize independence and return to PLOF.     Follow Up Recommendations Home health PT;Supervision/Assistance - 24 hour    Equipment Recommendations  Other (comment) (TBD)    Recommendations for Other Services       Precautions / Restrictions Precautions Precautions: Back;Fall Precaution Booklet Issued: Yes (comment) Precaution Comments: Able to independently verbalize 3/3 back precautions from prior session with OT. Required Braces or Orthoses: Spinal Brace Spinal Brace: Applied in sitting position Restrictions Weight Bearing Restrictions: No      Mobility  Bed Mobility Overal bed mobility: Needs Assistance Bed Mobility: Rolling;Sidelying to Sit Rolling: Min guard Sidelying to sit: Min guard       General bed mobility comments: Received sitting in chair upon PT arrival.   Transfers Overall transfer level: Needs assistance Equipment used: Rolling walker (2 wheeled) Transfers: Sit to/from Stand Sit to Stand: Min assist Stand pivot transfers: Min guard;From elevated surface       General transfer comment: Min A  to rise with cues for hand placement and technique.  Ambulation/Gait Ambulation/Gait assistance: Min guard Ambulation Distance (Feet): 125 Feet Assistive device: Rolling walker (2 wheeled) Gait Pattern/deviations: Step-through pattern;Trunk flexed;Decreased stride length   Gait  velocity interpretation: Below normal speed for age/gender General Gait Details: Pt with slow, mildly unsteady gait. Cues for upright posture and RW proximity.  Stairs            Wheelchair Mobility    Modified Rankin (Stroke Patients Only)       Balance Overall balance assessment: Needs assistance Sitting-balance support: Feet supported;No upper extremity supported Sitting balance-Leahy Scale: Good Sitting balance - Comments: donned brace in sitting position   Standing balance support: During functional activity Standing balance-Leahy Scale: Fair                               Pertinent Vitals/Pain Pain Assessment: 0-10 Pain Score: 4  Pain Location: back Pain Descriptors / Indicators: Sore;Aching Pain Intervention(s): Monitored during session;Premedicated before session    Home Living Family/patient expects to be discharged to:: Private residence Living Arrangements: Spouse/significant other Available Help at Discharge: Family;Available 24 hours/day Type of Home: House Home Access: Stairs to enter Entrance Stairs-Rails: Right Entrance Stairs-Number of Steps: 3 Home Layout: One level Home Equipment: Walker - standard;Shower seat - built in;Grab bars - tub/shower;Adaptive equipment      Prior Function Level of Independence: Independent with assistive device(s)         Comments: used a cane      Hand Dominance        Extremity/Trunk Assessment   Upper Extremity Assessment: Defer to OT evaluation;Overall WFL for tasks assessed           Lower Extremity Assessment: Generalized weakness         Communication   Communication: No difficulties  Cognition Arousal/Alertness: Awake/alert Behavior During Therapy: WFL for tasks assessed/performed Overall Cognitive Status: Within Functional Limits for tasks  assessed                      General Comments General comments (skin integrity, edema, etc.): pt reports decreased ability  to squat to retrieve items from floor despite using this technique since his previous back surgeries. Pain in L calf and palmar surface of L foot during dorsiflexion.    Exercises        Assessment/Plan    PT Assessment Patient needs continued PT services  PT Diagnosis Acute pain;Generalized weakness   PT Problem List Decreased strength;Decreased mobility;Decreased balance;Pain;Decreased activity tolerance  PT Treatment Interventions DME instruction;Therapeutic activities;Gait training;Therapeutic exercise;Stair training;Balance training;Patient/family education;Functional mobility training   PT Goals (Current goals can be found in the Care Plan section) Acute Rehab PT Goals Patient Stated Goal: to be independent PT Goal Formulation: With patient Time For Goal Achievement: 03/09/14 Potential to Achieve Goals: Fair    Frequency Min 5X/week   Barriers to discharge        Co-evaluation               End of Session Equipment Utilized During Treatment: Gait belt;Back brace Activity Tolerance: Patient tolerated treatment well Patient left: in chair;with call bell/phone within reach;with chair alarm set;with family/visitor present Nurse Communication: Mobility status         Time: 2706-2376 PT Time Calculation (min) (ACUTE ONLY): 15 min   Charges:   PT Evaluation $Initial PT Evaluation Tier I: 1 Procedure     PT G CodesCandy Sledge A 02/28/14, 11:33 AM Candy Sledge, PT, DPT [phone]

## 2014-02-23 NOTE — Discharge Instructions (Signed)

## 2014-02-23 NOTE — Progress Notes (Signed)
CARE MANAGEMENT NOTE 02/23/2014  Patient:  Cody Tate, Cody Tate   Account Number:  [id number]  Date Initiated:  02/23/2014  Documentation initiated by:  Olga Coaster  Subjective/Objective Assessment:   ADMITTED FOR SURGERY     Action/Plan:   CM FOLLOWING FOR DCP   Anticipated DC Date:  02/27/2014   Anticipated DC Plan:  AWAITING ON PT/OT EVALS FOR DISPOSITION NEEDS     DC Planning Services  CM consult         Status of service:  In process, will continue to follow Medicare Important Message given?   (If response is "NO", the following Medicare IM given date fields will be blank)  Per UR Regulation:  Reviewed for med. necessity/level of care/duration of stay  Comments:  2/23/2016Mindi Slicker RN,BSN,MHA [phone]

## 2014-02-23 NOTE — Progress Notes (Signed)
Talked to patient with spouse present about [facility] choices, patient does not want any HHC at this time; Patient requested RW/ 3:1; Jermaine with [facility] called to have equipment delivered to room prior to discharging home todayAneta Mins [phone]

## 2014-02-23 NOTE — Progress Notes (Signed)
Patient discharged home with wife. Neuro assessment unchanged. RN discussed discharge instructions and medications, prescriptions given to patient. Patient stated he understands discharge instructions and medications.

## 2014-02-24 MED FILL — Heparin Sodium (Porcine) Inj 1000 Unit/ML: INTRAMUSCULAR | Qty: 30 | Status: AC

## 2014-02-24 MED FILL — Sodium Chloride IV Soln 0.9%: INTRAVENOUS | Qty: 2000 | Status: AC

## 2014-03-24 ENCOUNTER — Other Ambulatory Visit: Payer: Self-pay | Admitting: Radiation Therapy

## 2014-03-24 DIAGNOSIS — D329 Benign neoplasm of meninges, unspecified: Secondary | ICD-10-CM

## 2014-03-25 ENCOUNTER — Other Ambulatory Visit (HOSPITAL_COMMUNITY): Payer: Self-pay | Admitting: Neurosurgery

## 2014-03-25 ENCOUNTER — Ambulatory Visit (HOSPITAL_COMMUNITY)
Admission: RE | Admit: 2014-03-25 | Discharge: 2014-03-25 | Disposition: A | Discharge status: Home / Self Care | Payer: Medicare Other | Source: Ambulatory Visit | Attending: Cardiology | Admitting: Cardiology

## 2014-03-25 ENCOUNTER — Other Ambulatory Visit (HOSPITAL_COMMUNITY): Payer: Self-pay | Admitting: Unknown Physician Specialty

## 2014-03-25 DIAGNOSIS — M7989 Other specified soft tissue disorders: Secondary | ICD-10-CM | POA: Insufficient documentation

## 2014-03-25 NOTE — Progress Notes (Signed)
Bilateral lower extremity venous duplex completed. No evidence for DVT or SVT. [place]

## 2014-04-08 ENCOUNTER — Telehealth (HOSPITAL_COMMUNITY): Payer: Self-pay | Admitting: *Deleted

## 2014-04-26 ENCOUNTER — Ambulatory Visit
Admission: RE | Admit: 2014-04-26 | Discharge: 2014-04-26 | Disposition: A | Discharge status: Home / Self Care | Payer: Medicare Other | Source: Ambulatory Visit | Attending: Radiation Oncology | Admitting: Radiation Oncology

## 2014-04-26 DIAGNOSIS — D329 Benign neoplasm of meninges, unspecified: Secondary | ICD-10-CM | POA: Diagnosis present

## 2014-04-26 LAB — BUN AND CREATININE (CC13)
BUN: 15.7 mg/dL (ref 7.0–26.0)
Creatinine: 0.9 mg/dL (ref 0.7–1.3)
EGFR: 90 mL/min/{1.73_m2} — ABNORMAL LOW (ref >90)

## 2014-04-26 NOTE — Progress Notes (Signed)
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  The recent films were presented in our multidisciplinary conference with neuroradiology just prior to the clinic. He denies headaches and neuro issues apartt from back pain.                              ALLERGIES:  is allergic to adhesive and other.  Meds: Current Outpatient Prescriptions  Medication Sig Dispense Refill  . albuterol (PROVENTIL HFA;VENTOLIN HFA) 108 (90 BASE) MCG/ACT inhaler Inhale into the lungs every 6 (six) hours as needed for wheezing or shortness of breath.    . ALPRAZolam (XANAX) 0.25 MG tablet Take 2 tablets (0.5 mg total) by mouth 3 (three) times daily as needed for sleep (20 min before MRI or radiation appointment). 30 tablet 0  . diazepam (VALIUM) 5 MG tablet Take 1-2 tablets (5-10 mg total) by mouth every 6 (six) hours as needed for muscle spasms. 60 tablet 0  . diphenhydrAMINE (SOMINEX) 25 MG tablet Take 25 mg by mouth at bedtime as needed for sleep.     . fluticasone (FLONASE) 50 MCG/ACT nasal spray Place 2 sprays into both nostrils at bedtime.     Marland Kitchen guaifenesin (HUMIBID E) 400 MG TABS Take 400 mg by mouth every 6 (six) hours as needed (for congestion). For mucus    . hydrochlorothiazide (HYDRODIURIL) 25 MG tablet Take 25 mg by mouth every morning.     Marland Kitchen ibuprofen (ADVIL,MOTRIN) 200 MG tablet Take 600 mg by mouth every 6 (six) hours as needed for pain. For pain    . latanoprost (XALATAN) 0.005 % ophthalmic solution Place 1 drop into both eyes at bedtime.     . magnesium hydroxide (MILK OF  MAGNESIA) 400 MG/5ML suspension Take 15 mLs by mouth at bedtime as needed for constipation. For constipation    . methocarbamol (ROBAXIN) 500 MG tablet Take 1 tablet (500 mg total) by mouth 4 (four) times daily. As needed for muscle spasm (Patient not taking: Reported on 02/09/2014) 30 tablet 1  . methylPREDNISolone (MEDROL DOSEPAK) 4 MG tablet follow package directions 21 tablet 0  . metoprolol succinate (TOPROL-XL) 100 MG 24 hr tablet Take 100 mg by mouth every evening. Take with or immediately following a meal.    . omeprazole (PRILOSEC) 40 MG capsule Take 40 mg by mouth every morning.    Marland Kitchen oxyCODONE-acetaminophen (PERCOCET/ROXICET) 5-325 MG per tablet Take 2 tablets by mouth every 4 (four) hours as needed for moderate pain.     Marland Kitchen oxyCODONE-acetaminophen (PERCOCET/ROXICET) 5-325 MG per tablet Take 1-2 tablets by mouth every 4 (four) hours as needed for moderate pain. 90 tablet 0  . oxymetazoline (AFRIN) 0.05 % nasal spray Place 2 sprays into both nostrils 2 (two) times daily as needed for congestion.    . pregabalin (LYRICA) 75 MG capsule Take 75 mg by mouth 3 (three) times daily.     . pseudoephedrine (SUDAFED) 30 MG tablet Take 30 mg by mouth  every 6 (six) hours as needed. For congestion    . ramipril (ALTACE) 10 MG capsule Take 10 mg by mouth every morning.     . Simethicone (GAS-X EXTRA STRENGTH PO) Take 1 tablet by mouth 4 (four) times daily as needed (for gas). For upset stomach    . simvastatin (ZOCOR) 20 MG tablet Take 20 mg by mouth every evening.    Marland Kitchen spironolactone (ALDACTONE) 25 MG tablet Take 12.5 mg by mouth every evening.     . Tamsulosin HCl (FLOMAX) 0.4 MG CAPS Take 0.4 mg by mouth 2 (two) times daily.     No current facility-administered medications for this encounter.    Physical Findings: The patient is in no acute distress. Patient is alert and oriented. .  No significant changes.  Lab Findings: Lab Results  Component Value Date   WBC 6.5 02/12/2014   HGB 17.0  02/12/2014   HCT 48.9 02/12/2014   MCV 88.9 02/12/2014   PLT 262 02/12/2014    @LASTCHEM @  Radiographic Findings: Mr Jeri Cos LK Contrast  09/08/2013   CLINICAL DATA:  Meningioma. Ten month restaging stereotactic radiosurgery  EXAM: MRI HEAD WITHOUT AND WITH CONTRAST  TECHNIQUE: Multiplanar, multiecho pulse sequences of the brain and surrounding structures were obtained without and with intravenous contrast.  CONTRAST:  3mL MULTIHANCE GADOBENATE DIMEGLUMINE 529 MG/ML IV SOLN  COMPARISON:  MRI 06/05/2013, 01/27/2013  FINDINGS: Right tentorial meningioma shows homogeneous enhancement. There is dural tail with thickening and enhancement of the right tentorium which is unchanged. The meningioma is slightly smaller prep fall 1 mm in size. The meningioma currently measures 9.5 x 14.4 x 14.8 mm, compared with 15.1 x 10.4 x 16.8 mm. Mild mass-effect on the right pons is unchanged. No edema in the pons.  No other enhancing lesions identified.  Generalized atrophy. Mild chronic microvascular ischemic change in the white matter.  Negative for acute infarct.  Mucosal edema in the paranasal sinuses.  IMPRESSION: Right tentorial meningioma appears slightly smaller by approximately 1 mm compared with the prior study. Mild flattening of the right pons without edema in the brainstem.  Mild chronic microvascular ischemic change.  No acute infarct.   Electronically Signed   By: Franchot Gallo M.D.   On: 09/08/2013 13:31    Impression:  The patient is recovering from the effects of radiation.  His treated meningioma is controlled. He does have some evidence of radiation effects in nearby cerebellum and brainstem.  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  Consider MRI in 3 months then follow-up.  This document serves as a record of services personally performed by Tyler Pita, MD. It was created on his behalf by Darcus Austin, a trained medical scribe. The creation of  this record is based on the scribe's personal observations and the provider's statements to them. This document has been checked and approved by the attending provider.     _____________________________________  [name]. Tammi Klippel, M.D.

## 2014-04-27 ENCOUNTER — Ambulatory Visit
Admission: RE | Admit: 2014-04-27 | Discharge: 2014-04-27 | Disposition: A | Discharge status: Home / Self Care | Payer: Medicare Other | Source: Ambulatory Visit | Attending: Radiation Oncology | Admitting: Radiation Oncology

## 2014-04-27 DIAGNOSIS — D329 Benign neoplasm of meninges, unspecified: Secondary | ICD-10-CM

## 2014-04-27 MED ORDER — GADOBENATE DIMEGLUMINE 529 MG/ML IV SOLN
20.0000 mL | Freq: Once | INTRAVENOUS | Status: AC | PRN
  Administered 2014-04-27: 20 mL via INTRAVENOUS

## 2014-04-28 ENCOUNTER — Ambulatory Visit
Admission: RE | Admit: 2014-04-28 | Discharge: 2014-04-28 | Disposition: A | Discharge status: Home / Self Care | Payer: Medicare Other | Source: Ambulatory Visit | Attending: Radiation Oncology | Admitting: Radiation Oncology

## 2014-04-28 DIAGNOSIS — D329 Benign neoplasm of meninges, unspecified: Secondary | ICD-10-CM

## 2014-04-28 MED ORDER — PENTOXIFYLLINE ER 400 MG PO TBCR: 400.0000 mg | EXTENDED_RELEASE_TABLET | Freq: Two times a day (BID) | ORAL | Status: DC

## 2014-05-05 NOTE — Addendum Note (Signed)
Encounter addended by: Tyler Pita, MD on: 05/05/2014  9:37 AM<BR>     Documentation filed: Follow-up Section, LOS Section

## 2014-06-01 ENCOUNTER — Other Ambulatory Visit: Payer: Self-pay | Admitting: *Deleted

## 2014-06-01 DIAGNOSIS — C7949 Secondary malignant neoplasm of other parts of nervous system: Principal | ICD-10-CM

## 2014-06-01 DIAGNOSIS — C7931 Secondary malignant neoplasm of brain: Secondary | ICD-10-CM

## 2014-08-05 ENCOUNTER — Ambulatory Visit
Admission: RE | Admit: 2014-08-05 | Discharge: 2014-08-05 | Disposition: A | Discharge status: Home / Self Care | Payer: Medicare Other | Source: Ambulatory Visit | Attending: Radiation Oncology | Admitting: Radiation Oncology

## 2014-08-05 DIAGNOSIS — C7949 Secondary malignant neoplasm of other parts of nervous system: Principal | ICD-10-CM

## 2014-08-05 DIAGNOSIS — C7931 Secondary malignant neoplasm of brain: Secondary | ICD-10-CM | POA: Insufficient documentation

## 2014-08-05 DIAGNOSIS — D320 Benign neoplasm of cerebral meninges: Secondary | ICD-10-CM | POA: Diagnosis not present

## 2014-08-05 LAB — BUN AND CREATININE (CC13)
BUN: 22.8 mg/dL (ref 7.0–26.0)
CREATININE: 1 mg/dL (ref 0.7–1.3)
EGFR: 75 mL/min/{1.73_m2} — ABNORMAL LOW (ref >90)

## 2014-08-05 MED ORDER — GADOBENATE DIMEGLUMINE 529 MG/ML IV SOLN
20.0000 mL | Freq: Once | INTRAVENOUS | Status: AC | PRN
  Administered 2014-08-05: 20 mL via INTRAVENOUS

## 2014-08-09 ENCOUNTER — Ambulatory Visit
Admission: RE | Admit: 2014-08-09 | Discharge: 2014-08-09 | Disposition: A | Discharge status: Home / Self Care | Payer: Medicare Other | Source: Ambulatory Visit | Attending: Radiation Oncology | Admitting: Radiation Oncology

## 2014-08-09 ENCOUNTER — Encounter: Payer: Self-pay | Admitting: Radiation Oncology

## 2014-08-09 VITALS — BP 135/65 | HR 69 | Resp 16 | Wt 225.5 lb

## 2014-08-09 DIAGNOSIS — D329 Benign neoplasm of meninges, unspecified: Secondary | ICD-10-CM

## 2014-08-09 NOTE — Progress Notes (Signed)
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 The recent films were presented in our multidisciplinary conference with neuroradiology just prior to the clinic.Weight and vitals stable. Explains he had another back surgery 2/22. He relates his unsteady gait despite using cane to effects of nerve damage. Reports he has fallen a few times at home related to unsteady gait. 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 Reports it is painful to ambulate. Denies headache, dizziness, nausea, vomiting, diplopia or ringing in the ears. Answer appropriately and quickly to questions. Reports taking trental and vitamin e as directed.                             ALLERGIES:  is allergic to adhesive and other.  Meds: Current Outpatient Prescriptions  Medication Sig Dispense Refill  . albuterol (PROVENTIL HFA;VENTOLIN HFA) 108 (90 BASE) MCG/ACT inhaler Inhale into the lungs every 6 (six) hours as needed for wheezing or shortness of breath.    . diphenhydrAMINE (SOMINEX) 25 MG tablet Take 25 mg by mouth at bedtime as needed for sleep.     . fluticasone (FLONASE) 50 MCG/ACT nasal spray Place 2 sprays into both nostrils at bedtime.     . hydrochlorothiazide (HYDRODIURIL) 25 MG tablet Take 25 mg by mouth every morning.     . metoprolol succinate (TOPROL-XL) 100 MG 24 hr tablet Take 100 mg by mouth every evening. Take with or immediately following a meal.    . omeprazole (PRILOSEC) 40 MG capsule Take 40 mg by mouth every morning.    Marland Kitchen  oxyCODONE-acetaminophen (PERCOCET/ROXICET) 5-325 MG per tablet Take 2 tablets by mouth every 4 (four) hours as needed for moderate pain.     . pentoxifylline (TRENTAL) 400 MG CR tablet Take 1 tablet (400 mg total) by mouth 2 (two) times daily with a meal. Take with Vitamin E 400 IU twice daily for radiation effets 60 tablet 5  . ramipril (ALTACE) 10 MG capsule Take 10 mg by mouth every morning.     . simvastatin (ZOCOR) 20 MG tablet Take 20 mg by mouth every evening.    Marland Kitchen spironolactone (ALDACTONE) 25 MG tablet Take 12.5 mg by mouth every evening.     . Tamsulosin HCl (FLOMAX) 0.4 MG CAPS Take 0.4 mg by mouth 2 (two) times daily.    . vitamin E 400 UNIT capsule Take 400 Units by mouth daily.    Marland Kitchen ALPRAZolam (XANAX) 0.25 MG tablet Take 2 tablets (0.5 mg total) by mouth 3 (three) times daily as needed for sleep (20 min before MRI or radiation appointment). (Patient not taking: Reported on 08/09/2014) 30 tablet 0  . clindamycin (CLEOCIN) 150 MG capsule TAKE ONE CAPSULE BY MOUTH 4 TIMES A DAY FOR INFECTION  1  . diazepam (VALIUM) 5 MG tablet Take 1-2 tablets (5-10 mg total) by mouth every 6 (six) hours as needed for muscle spasms. (Patient not taking: Reported on 08/09/2014) 60 tablet  0  . guaifenesin (HUMIBID E) 400 MG TABS Take 400 mg by mouth every 6 (six) hours as needed (for congestion). For mucus    . ibuprofen (ADVIL,MOTRIN) 200 MG tablet Take 600 mg by mouth every 6 (six) hours as needed for pain. For pain    . latanoprost (XALATAN) 0.005 % ophthalmic solution Place 1 drop into both eyes at bedtime.     . magnesium hydroxide (MILK OF MAGNESIA) 400 MG/5ML suspension Take 15 mLs by mouth at bedtime as needed for constipation. For constipation    . methocarbamol (ROBAXIN) 500 MG tablet Take 1 tablet (500 mg total) by mouth 4 (four) times daily. As needed for muscle spasm (Patient not taking: Reported on 02/09/2014) 30 tablet 1  . methylPREDNISolone (MEDROL DOSEPAK) 4 MG tablet follow package directions  (Patient not taking: Reported on 08/09/2014) 21 tablet 0  . oxymetazoline (AFRIN) 0.05 % nasal spray Place 2 sprays into both nostrils 2 (two) times daily as needed for congestion.    . pregabalin (LYRICA) 75 MG capsule Take 75 mg by mouth 3 (three) times daily.     . pseudoephedrine (SUDAFED) 30 MG tablet Take 30 mg by mouth every 6 (six) hours as needed. For congestion    . Simethicone (GAS-X EXTRA STRENGTH PO) Take 1 tablet by mouth 4 (four) times daily as needed (for gas). For upset stomach     No current facility-administered medications for this encounter.    Physical Findings: The patient is in no acute distress. Patient is alert and oriented. No significant changes. Filed Vitals:   08/09/14 0949  BP: 135/65  Pulse: 69  Resp: 16     Lab Findings: Lab Results  Component Value Date   WBC 6.5 02/12/2014   HGB 17.0 02/12/2014   HCT 48.9 02/12/2014   MCV 88.9 02/12/2014   PLT 262 02/12/2014    @LASTCHEM @  Radiographic Findings: Mr Jeri Cos LN Contrast  09/08/2013   CLINICAL DATA:  Meningioma. Ten month restaging stereotactic radiosurgery  EXAM: MRI HEAD WITHOUT AND WITH CONTRAST  TECHNIQUE: Multiplanar, multiecho pulse sequences of the brain and surrounding structures were obtained without and with intravenous contrast.  CONTRAST:  62mL MULTIHANCE GADOBENATE DIMEGLUMINE 529 MG/ML IV SOLN  COMPARISON:  MRI 06/05/2013, 01/27/2013  FINDINGS: Right tentorial meningioma shows homogeneous enhancement. There is dural tail with thickening and enhancement of the right tentorium which is unchanged. The meningioma is slightly smaller prep fall 1 mm in size. The meningioma currently measures 9.5 x 14.4 x 14.8 mm, compared with 15.1 x 10.4 x 16.8 mm. Mild mass-effect on the right pons is unchanged. No edema in the pons.  No other enhancing lesions identified.  Generalized atrophy. Mild chronic microvascular ischemic change in the white matter.  Negative for acute infarct.  Mucosal edema in the  paranasal sinuses.  IMPRESSION: Right tentorial meningioma appears slightly smaller by approximately 1 mm compared with the prior study. Mild flattening of the right pons without edema in the brainstem.  Mild chronic microvascular ischemic change.  No acute infarct.   Electronically Signed   By: Franchot Gallo M.D.   On: 09/08/2013 13:31     Impression:  The patient is recovering from the effects of radiation. His treated meningioma is controlled. Radiation necrosis is present. 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   Plan: MRI in 6 months then follow-up. I advised the patient to continue to take Trental and Vitamin E.  _____________________________________  [name] Tammi Klippel, M.D.  This document serves as a record of services personally performed by Tyler Pita, MD. It was created on his behalf by Derek Mound, a trained medical scribe. The creation of this record is based on the scribe's personal observations and the provider's statements to them. This document has been checked and approved by the attending provider.

## 2014-08-09 NOTE — Progress Notes (Signed)
Weight and vitals stable. Explains he had another back surgery 2/22. He relates his unsteady gait despite using cane to effects of nerve damage. Reports he has fallen a few times at home related to unsteady gait. 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 Reports it is painful to ambulate. Denies headache, dizziness, nausea, vomiting, diplopia or ringing in the ears. Answer appropriately and quickly to questions. Reports taking trental and vitamin e as directed.  BP 135/65 mmHg  Pulse 69  Resp 16  Wt 225 lb 8 oz (102.286 kg)  SpO2 100% Wt Readings from Last 3 Encounters:  08/09/14 225 lb 8 oz (102.286 kg)  08/05/14 214 lb (97.07 kg)  04/27/14 223 lb (101.152 kg)

## 2014-08-12 DIAGNOSIS — H4011X1 Primary open-angle glaucoma, mild stage: Secondary | ICD-10-CM | POA: Diagnosis not present

## 2014-08-17 ENCOUNTER — Encounter: Payer: Self-pay | Admitting: Radiation Oncology

## 2014-08-17 ENCOUNTER — Telehealth: Payer: Self-pay | Admitting: Radiation Oncology

## 2014-08-17 NOTE — Telephone Encounter (Signed)
Opened in error

## 2014-08-18 ENCOUNTER — Telehealth: Payer: Self-pay | Admitting: Radiation Oncology

## 2014-08-18 DIAGNOSIS — Z6835 Body mass index (BMI) 35.0-35.9, adult: Secondary | ICD-10-CM | POA: Diagnosis not present

## 2014-08-18 DIAGNOSIS — M4806 Spinal stenosis, lumbar region: Secondary | ICD-10-CM | POA: Diagnosis not present

## 2014-08-18 NOTE — Telephone Encounter (Signed)
Per Dr. Johny Shears order called in Trental to [facility], [place]. Ordered 90 day supply with one refill. Phoned patient's wife making her aware this was done.

## 2014-08-20 ENCOUNTER — Telehealth: Payer: Self-pay | Admitting: Radiation Oncology

## 2014-08-20 NOTE — Telephone Encounter (Signed)
Phoned pharmacist, Willey Blade, at [place].  Explained a 90 day supply was requested not 90 tablets. He reports the script will be corrected. Phoned patient's wife making her aware of this.

## 2014-09-03 ENCOUNTER — Telehealth: Payer: Self-pay | Admitting: Radiation Oncology

## 2014-09-03 NOTE — Telephone Encounter (Signed)
Fax back completed and signed [facility]. Confirmed of delivery fax obtained.

## 2014-10-06 ENCOUNTER — Other Ambulatory Visit: Payer: Self-pay | Admitting: Internal Medicine

## 2014-10-06 ENCOUNTER — Ambulatory Visit
Admission: RE | Admit: 2014-10-06 | Discharge: 2014-10-06 | Disposition: A | Discharge status: Home / Self Care | Payer: Medicare Other | Source: Ambulatory Visit | Attending: Internal Medicine | Admitting: Internal Medicine

## 2014-10-06 DIAGNOSIS — J209 Acute bronchitis, unspecified: Secondary | ICD-10-CM | POA: Diagnosis not present

## 2014-10-06 DIAGNOSIS — R042 Hemoptysis: Secondary | ICD-10-CM

## 2014-10-06 DIAGNOSIS — I10 Essential (primary) hypertension: Secondary | ICD-10-CM | POA: Diagnosis not present

## 2014-10-06 DIAGNOSIS — R05 Cough: Secondary | ICD-10-CM | POA: Diagnosis not present

## 2014-10-22 ENCOUNTER — Other Ambulatory Visit: Payer: Self-pay | Admitting: Geriatric Medicine

## 2014-10-22 ENCOUNTER — Ambulatory Visit
Admission: RE | Admit: 2014-10-22 | Discharge: 2014-10-22 | Disposition: A | Discharge status: Home / Self Care | Payer: Medicare Other | Source: Ambulatory Visit | Attending: Geriatric Medicine | Admitting: Geriatric Medicine

## 2014-10-22 DIAGNOSIS — Z23 Encounter for immunization: Secondary | ICD-10-CM | POA: Diagnosis not present

## 2014-10-22 DIAGNOSIS — E669 Obesity, unspecified: Secondary | ICD-10-CM | POA: Diagnosis not present

## 2014-10-22 DIAGNOSIS — J90 Pleural effusion, not elsewhere classified: Secondary | ICD-10-CM

## 2014-10-22 DIAGNOSIS — I700 Atherosclerosis of aorta: Secondary | ICD-10-CM | POA: Diagnosis not present

## 2014-10-22 DIAGNOSIS — Z6834 Body mass index (BMI) 34.0-34.9, adult: Secondary | ICD-10-CM | POA: Diagnosis not present

## 2014-10-22 DIAGNOSIS — I10 Essential (primary) hypertension: Secondary | ICD-10-CM | POA: Diagnosis not present

## 2014-11-15 DIAGNOSIS — H401131 Primary open-angle glaucoma, bilateral, mild stage: Secondary | ICD-10-CM | POA: Diagnosis not present

## 2014-11-29 ENCOUNTER — Other Ambulatory Visit: Payer: Self-pay | Admitting: Radiation Oncology

## 2014-12-09 DIAGNOSIS — I10 Essential (primary) hypertension: Secondary | ICD-10-CM | POA: Diagnosis not present

## 2014-12-09 DIAGNOSIS — Z6835 Body mass index (BMI) 35.0-35.9, adult: Secondary | ICD-10-CM | POA: Diagnosis not present

## 2014-12-09 DIAGNOSIS — M4806 Spinal stenosis, lumbar region: Secondary | ICD-10-CM | POA: Diagnosis not present

## 2015-01-04 ENCOUNTER — Other Ambulatory Visit: Payer: Self-pay | Admitting: *Deleted

## 2015-01-04 DIAGNOSIS — C7949 Secondary malignant neoplasm of other parts of nervous system: Principal | ICD-10-CM

## 2015-01-04 DIAGNOSIS — C7931 Secondary malignant neoplasm of brain: Secondary | ICD-10-CM

## 2015-01-04 NOTE — Telephone Encounter (Signed)
QUESTIONS FOR MRI 1.  Do you need a wheel chair?  NO  2. On oxygen? NO  3. Have you ever had any surgery in the body part being scanned?  NO  4. Have you ever had any surgery on your brain or heart?     NO                   5. Have you ever had surgery on your eyes or ears?      CATARACT SURGERY                        6. Do you have a pacemaker or defibrillator?  NO  7. Do you have a Neurostimulator?  NO  8. Claustrophobic?  YES  9. Any risk for metal in eyes? NO  10. Injury by bullet, buckshot, or shrapnel?  NO  11. Stent?  no                                                                                                                 12. Hx of Cancer? NO  13. Kidney or Liver disease?  NO  14. Hx of Lupus, Rheumatoid Arthritis or Scleroderma? NO  15. IV Antibiotics or long term use of NSAIDS?  NO   16. HX of Hypertension?  YES  17. Diabetes? NO   18. Allergy to contrast?  NO  19. Recent labs. NEEDS LABS  20. No history of previous radiation.  YES

## 2015-02-07 ENCOUNTER — Ambulatory Visit
Admission: RE | Admit: 2015-02-07 | Discharge: 2015-02-07 | Disposition: A | Discharge status: Home / Self Care | Payer: Medicare Other | Source: Ambulatory Visit | Attending: Radiation Oncology | Admitting: Radiation Oncology

## 2015-02-07 ENCOUNTER — Ambulatory Visit: Payer: Medicare Other

## 2015-02-07 ENCOUNTER — Ambulatory Visit: Admission: RE | Admit: 2015-02-07 | Payer: Medicare Other | Source: Ambulatory Visit

## 2015-02-07 DIAGNOSIS — C7931 Secondary malignant neoplasm of brain: Secondary | ICD-10-CM

## 2015-02-07 DIAGNOSIS — C7949 Secondary malignant neoplasm of other parts of nervous system: Principal | ICD-10-CM

## 2015-02-07 DIAGNOSIS — D329 Benign neoplasm of meninges, unspecified: Secondary | ICD-10-CM | POA: Diagnosis not present

## 2015-02-07 MED ORDER — GADOBENATE DIMEGLUMINE 529 MG/ML IV SOLN
20.0000 mL | Freq: Once | INTRAVENOUS | Status: AC | PRN
Start: 2015-02-07 — End: 2015-02-07
  Administered 2015-02-07: 20 mL via INTRAVENOUS

## 2015-02-10 ENCOUNTER — Other Ambulatory Visit: Payer: Self-pay | Admitting: Geriatric Medicine

## 2015-02-10 DIAGNOSIS — Z87891 Personal history of nicotine dependence: Secondary | ICD-10-CM

## 2015-02-10 DIAGNOSIS — F17211 Nicotine dependence, cigarettes, in remission: Secondary | ICD-10-CM | POA: Diagnosis not present

## 2015-02-10 DIAGNOSIS — Z0000 Encounter for general adult medical examination without abnormal findings: Secondary | ICD-10-CM | POA: Diagnosis not present

## 2015-02-10 DIAGNOSIS — I10 Essential (primary) hypertension: Secondary | ICD-10-CM | POA: Diagnosis not present

## 2015-02-10 DIAGNOSIS — E669 Obesity, unspecified: Secondary | ICD-10-CM | POA: Diagnosis not present

## 2015-02-10 DIAGNOSIS — Z23 Encounter for immunization: Secondary | ICD-10-CM | POA: Diagnosis not present

## 2015-02-10 DIAGNOSIS — Z1389 Encounter for screening for other disorder: Secondary | ICD-10-CM | POA: Diagnosis not present

## 2015-02-10 DIAGNOSIS — I700 Atherosclerosis of aorta: Secondary | ICD-10-CM | POA: Diagnosis not present

## 2015-02-10 DIAGNOSIS — Z6834 Body mass index (BMI) 34.0-34.9, adult: Secondary | ICD-10-CM | POA: Diagnosis not present

## 2015-02-10 DIAGNOSIS — J449 Chronic obstructive pulmonary disease, unspecified: Secondary | ICD-10-CM | POA: Diagnosis not present

## 2015-02-10 DIAGNOSIS — K219 Gastro-esophageal reflux disease without esophagitis: Secondary | ICD-10-CM | POA: Diagnosis not present

## 2015-02-10 DIAGNOSIS — D329 Benign neoplasm of meninges, unspecified: Secondary | ICD-10-CM | POA: Diagnosis not present

## 2015-02-11 ENCOUNTER — Other Ambulatory Visit: Payer: Self-pay | Admitting: Geriatric Medicine

## 2015-02-11 DIAGNOSIS — F17219 Nicotine dependence, cigarettes, with unspecified nicotine-induced disorders: Secondary | ICD-10-CM

## 2015-02-11 DIAGNOSIS — F17211 Nicotine dependence, cigarettes, in remission: Secondary | ICD-10-CM

## 2015-02-14 ENCOUNTER — Ambulatory Visit
Admission: RE | Admit: 2015-02-14 | Discharge: 2015-02-14 | Disposition: A | Discharge status: Home / Self Care | Payer: Medicare Other | Source: Ambulatory Visit | Attending: Radiation Oncology | Admitting: Radiation Oncology

## 2015-02-14 VITALS — BP 127/74 | HR 68 | Resp 16 | Wt 220.2 lb

## 2015-02-14 DIAGNOSIS — D329 Benign neoplasm of meninges, unspecified: Secondary | ICD-10-CM

## 2015-02-14 DIAGNOSIS — I10 Essential (primary) hypertension: Secondary | ICD-10-CM | POA: Diagnosis not present

## 2015-02-14 DIAGNOSIS — Z79899 Other long term (current) drug therapy: Secondary | ICD-10-CM | POA: Diagnosis not present

## 2015-02-14 DIAGNOSIS — E7800 Pure hypercholesterolemia, unspecified: Secondary | ICD-10-CM | POA: Diagnosis not present

## 2015-02-14 NOTE — Progress Notes (Signed)
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 The recent films were presented in our multidisciplinary conference with neuroradiology just prior to the clinic. Brain MRI on 02/07/15 showed a decrease in size of the right tentorial meningioma and the T2 signal abnormality. Enhancement in the right superior cerebellum was noted to improve and was now 4 mm. 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  On review of systems, the patient reports that he is doing okay. He continues to have episodes of unsteady gait and falls due to his radiculopathy and his spine. He states that he has been stable from this perspective however. He continues to have episodes of tinnitus but denies any changes with this. He is also been working with Dr. Gershon Crane on symptoms of blurred vision and a blind spot that is notable in his right visual field particularly noticed when he is trying to focus on a page when reading. He describes a sense of blurring around the blind spot, and that the images duplicated. He denies any sensation of a curtain being pulled over his visual field. He denies any new headaches. He is not experiencing any nausea or vomiting. He denies any chest pain or shortness of breath. His wife has not noticed any changes in his speech. Apparently his left eyelid droops and this is a chronic issue. No other complaints or verbalized.  ALLERGIES:  is allergic to adhesive and other.  Meds: Current Outpatient Prescriptions  Medication Sig Dispense Refill  . albuterol (PROVENTIL  HFA;VENTOLIN HFA) 108 (90 BASE) MCG/ACT inhaler Inhale into the lungs every 6 (six) hours as needed for wheezing or shortness of breath.    . diazepam (VALIUM) 5 MG tablet Take 1-2 tablets (5-10 mg total) by mouth every 6 (six) hours as needed for muscle spasms. 60 tablet 0  . diphenhydrAMINE (SOMINEX) 25 MG tablet Take 25 mg by mouth at bedtime as needed for sleep.     . fluticasone (FLONASE) 50 MCG/ACT nasal spray Place 2 sprays into both nostrils at bedtime.     . gabapentin (NEURONTIN) 300 MG capsule     . guaifenesin (HUMIBID E) 400 MG TABS Take 400 mg by mouth every 6 (six) hours as needed (for congestion). For mucus    . hydrochlorothiazide (HYDRODIURIL) 25 MG tablet Take 25 mg by mouth every morning.     Marland Kitchen ibuprofen (ADVIL,MOTRIN) 200 MG tablet Take 600 mg by mouth every 6 (six) hours as needed for pain. For pain    . latanoprost (XALATAN) 0.005 % ophthalmic solution Place 1 drop into both eyes at bedtime.     . magnesium hydroxide (MILK OF MAGNESIA) 400 MG/5ML suspension Take 15 mLs by mouth at bedtime as needed for constipation. For constipation    . metoprolol succinate (TOPROL-XL) 100 MG 24 hr tablet Take 100 mg by mouth every evening. Take with or immediately following a meal.    . Multiple Vitamins-Minerals (MULTIVITAMIN ADULT PO) Take by mouth.    Marland Kitchen omeprazole (PRILOSEC) 40 MG capsule  Take 40 mg by mouth every morning.    Marland Kitchen oxyCODONE-acetaminophen (PERCOCET/ROXICET) 5-325 MG per tablet Take 2 tablets by mouth every 4 (four) hours as needed for moderate pain.     Marland Kitchen oxymetazoline (AFRIN) 0.05 % nasal spray Place 2 sprays into both nostrils 2 (two) times daily as needed for congestion.    . pentoxifylline (TRENTAL) 400 MG CR tablet Take 1 tablet (400 mg total) by mouth 2 (two) times daily. With Vitamin E 800 units daily 60 tablet 5  . pseudoephedrine (SUDAFED) 30 MG tablet Take 30 mg by mouth every 6 (six) hours as needed. For congestion    . ramipril (ALTACE) 10 MG capsule Take 10 mg by  mouth every morning.     . Simethicone (GAS-X EXTRA STRENGTH PO) Take 1 tablet by mouth 4 (four) times daily as needed (for gas). For upset stomach    . simvastatin (ZOCOR) 20 MG tablet Take 20 mg by mouth every evening.    Marland Kitchen spironolactone (ALDACTONE) 25 MG tablet Take 12.5 mg by mouth every evening.     . Tamsulosin HCl (FLOMAX) 0.4 MG CAPS Take 0.4 mg by mouth 2 (two) times daily.    . vitamin E 400 UNIT capsule Take 400 Units by mouth daily.    Marland Kitchen ALPRAZolam (XANAX) 0.25 MG tablet Take 2 tablets (0.5 mg total) by mouth 3 (three) times daily as needed for sleep (20 min before MRI or radiation appointment). (Patient not taking: Reported on 08/09/2014) 30 tablet 0   No current facility-administered medications for this encounter.    Physical Findings: BP 127/74 mmHg  Pulse 68  Resp 16  Wt 220 lb 3.2 oz (99.882 kg)  SpO2 100% Pain scale 0/10 In general this is a well-appearing Caucasian male in no acute distress. He is alert and oriented times for appropriate throughout the examination. Cardio pulmonary assessment reveals no acute distress and he exhibits normal effort. He has some instability when standing and requires a cane. He does not have any foot drop when walking. He otherwise appears to be grossly intact from a neurologic perspective.   Filed Vitals:   02/14/15 0948  BP: 127/74  Pulse: 68  Resp: 16    Lab Findings: Lab Results  Component Value Date   WBC 6.5 02/12/2014   HGB 17.0 02/12/2014   HCT 48.9 02/12/2014   MCV 88.9 02/12/2014   PLT 262 02/12/2014    Radiographic Findings:  Mr Jeri Cos [id number] Contrast  02/07/2015  CLINICAL DATA:  Tentorial meningioma. SRS restaging. Subsequent encounter. Creatinine was obtained on site at [place] at [address] Results: Creatinine 0.9 mg/dL. EXAM: MRI HEAD WITHOUT AND WITH CONTRAST TECHNIQUE: Multiplanar, multiecho pulse sequences of the brain and surrounding structures were obtained without and with intravenous  contrast. CONTRAST:  7mL MULTIHANCE GADOBENATE DIMEGLUMINE 529 MG/ML IV SOLN COMPARISON:  MRI brain 08/05/2014. FINDINGS: A meningioma along the right tentorium continues to demonstrates slight decrease and overall size. Lesion now measures 14 mm and maximal dimension on the coronal image compared with 15 mm previously. Other measurements are stable. The area of enhancement within the right superior cerebellum continues to decrease the cephalo caudad dimension is particular reduced, now measuring 4 mm maximally. No new areas of enhancement are present. Mild periventricular and subcortical white matter disease is otherwise stable. No acute infarct, hemorrhage, or new mass lesion is present. The internal auditory canals are within normal limits. Flow is present in the major intracranial arteries. The globes and  orbits are intact. Bilateral lens replacements are noted. Mild mucosal thickening is present in the ethmoid air cells bilaterally. A small fluid level is present in the left maxillary sinus. The mastoid air cells are clear. No obstructing nasopharyngeal lesion is evident. The skullbase is within normal limits. A nonenhancing septated cyst within the pineal gland is stable at 10.5 mm. Midline sagittal images are otherwise unremarkable. IMPRESSION: 1. Continued slight decrease in size of a right tentorial meningioma. 2. Continued decrease and focal area of T2 signal abnormality and enhancement in the right superior cerebellum compatible with radiation necrosis. 3. Stable T2 changes otherwise. 4. Stable benign appearing cranial cyst. 5. Mild sinus disease. Electronically Signed   By: San Morelle M.D.   On: 02/07/2015 12:12   Impression: Meningioma, status post SRS in February 2015 to 15 Gy   Plan: We discussed the MRI findings and are pleased at the improvement on his scan. We will plan to follow up with him in 6 months time for continued evaluation. With regard to his Trental, Dr. Tammi Klippel discusses  that as he's been on this for now a year, and has improvement of the areas on scan, he can discontinue his Trental. The patient is given the option to continue vitamin E at his discretion, and we will follow up with him in 6 months time for continued evaluation. He is also informed that the findings on his MRI do not seem to correlate with symptoms he is having with visual changes. We have recommended that he continue to follow up with Dr. Gershon Crane for workup of this.   The above documentation reflects my direct findings during this shared patient visit. Please see the separate note by Dr. Tammi Klippel on this date for the remainder of the patient's plan of care.  Carola Rhine, PAC   This document serves as a record of services personally performed by Shona Simpson, and Tyler Pita, MD. It was created on their behalf by Darcus Austin, a trained medical scribe. The creation of this record is based on the scribe's personal observations and the provider's statements to them. This document has been checked and approved by the attending provider.

## 2015-02-14 NOTE — Progress Notes (Signed)
Weight and vitals stable. Reports he continues to have an unsteady gait and numerous falls.  Reports ringing in the ears. Reports difficulty with his vision in the right eye especially after reading; describes this as a blindspot. Reports Dr. Gershon Crane is aware of this but, no interventions have been ordered. Left eyelid droops. Questions if he should continue Trental and Vitamin E. If so, needs a 90 day script with two refills ordered through [employer]. Reports leg weakness continues. Reports back pain continues while ambulating.   BP 127/74 mmHg  Pulse 68  Resp 16  Wt 220 lb 3.2 oz (99.882 kg)  SpO2 100% Wt Readings from Last 3 Encounters:  02/14/15 220 lb 3.2 oz (99.882 kg)  08/09/14 225 lb 8 oz (102.286 kg)  08/05/14 214 lb (97.07 kg)

## 2015-02-17 DIAGNOSIS — Z961 Presence of intraocular lens: Secondary | ICD-10-CM | POA: Diagnosis not present

## 2015-02-17 DIAGNOSIS — H401131 Primary open-angle glaucoma, bilateral, mild stage: Secondary | ICD-10-CM | POA: Diagnosis not present

## 2015-02-21 ENCOUNTER — Ambulatory Visit
Admission: RE | Admit: 2015-02-21 | Discharge: 2015-02-21 | Disposition: A | Discharge status: Home / Self Care | Payer: Medicare Other | Source: Ambulatory Visit | Attending: Geriatric Medicine | Admitting: Geriatric Medicine

## 2015-02-21 DIAGNOSIS — I77811 Abdominal aortic ectasia: Secondary | ICD-10-CM | POA: Diagnosis not present

## 2015-02-21 DIAGNOSIS — Z87891 Personal history of nicotine dependence: Secondary | ICD-10-CM

## 2015-02-24 ENCOUNTER — Other Ambulatory Visit: Payer: Self-pay | Admitting: Geriatric Medicine

## 2015-02-24 ENCOUNTER — Ambulatory Visit
Admission: RE | Admit: 2015-02-24 | Discharge: 2015-02-24 | Disposition: A | Discharge status: Home / Self Care | Payer: Medicare Other | Source: Ambulatory Visit | Attending: Geriatric Medicine | Admitting: Geriatric Medicine

## 2015-02-24 DIAGNOSIS — Z87891 Personal history of nicotine dependence: Secondary | ICD-10-CM

## 2015-02-24 DIAGNOSIS — F17211 Nicotine dependence, cigarettes, in remission: Secondary | ICD-10-CM

## 2015-02-24 DIAGNOSIS — J449 Chronic obstructive pulmonary disease, unspecified: Secondary | ICD-10-CM | POA: Diagnosis not present

## 2015-03-10 DIAGNOSIS — M4806 Spinal stenosis, lumbar region: Secondary | ICD-10-CM | POA: Diagnosis not present

## 2015-03-10 DIAGNOSIS — D320 Benign neoplasm of cerebral meninges: Secondary | ICD-10-CM | POA: Diagnosis not present

## 2015-03-10 DIAGNOSIS — Z6835 Body mass index (BMI) 35.0-35.9, adult: Secondary | ICD-10-CM | POA: Diagnosis not present

## 2015-05-16 DIAGNOSIS — H401131 Primary open-angle glaucoma, bilateral, mild stage: Secondary | ICD-10-CM | POA: Diagnosis not present

## 2015-05-27 ENCOUNTER — Other Ambulatory Visit: Payer: Self-pay | Admitting: Radiation Therapy

## 2015-05-27 DIAGNOSIS — D329 Benign neoplasm of meninges, unspecified: Secondary | ICD-10-CM

## 2015-07-15 DIAGNOSIS — M4806 Spinal stenosis, lumbar region: Secondary | ICD-10-CM | POA: Diagnosis not present

## 2015-07-15 DIAGNOSIS — Z6833 Body mass index (BMI) 33.0-33.9, adult: Secondary | ICD-10-CM | POA: Diagnosis not present

## 2015-08-10 DIAGNOSIS — I10 Essential (primary) hypertension: Secondary | ICD-10-CM | POA: Diagnosis not present

## 2015-08-10 DIAGNOSIS — M545 Low back pain: Secondary | ICD-10-CM | POA: Diagnosis not present

## 2015-08-10 DIAGNOSIS — G8929 Other chronic pain: Secondary | ICD-10-CM | POA: Diagnosis not present

## 2015-08-10 DIAGNOSIS — Z79899 Other long term (current) drug therapy: Secondary | ICD-10-CM | POA: Diagnosis not present

## 2015-08-12 ENCOUNTER — Ambulatory Visit
Admission: RE | Admit: 2015-08-12 | Discharge: 2015-08-12 | Disposition: A | Discharge status: Home / Self Care | Payer: Medicare Other | Source: Ambulatory Visit | Attending: Radiation Oncology | Admitting: Radiation Oncology

## 2015-08-12 DIAGNOSIS — D320 Benign neoplasm of cerebral meninges: Secondary | ICD-10-CM | POA: Diagnosis not present

## 2015-08-12 DIAGNOSIS — D329 Benign neoplasm of meninges, unspecified: Secondary | ICD-10-CM

## 2015-08-12 MED ORDER — GADOBENATE DIMEGLUMINE 529 MG/ML IV SOLN
20.0000 mL | Freq: Once | INTRAVENOUS | Status: AC | PRN
  Administered 2015-08-12: 20 mL via INTRAVENOUS

## 2015-08-15 ENCOUNTER — Ambulatory Visit
Admission: RE | Admit: 2015-08-15 | Discharge: 2015-08-15 | Disposition: A | Discharge status: Home / Self Care | Payer: Medicare Other | Source: Ambulatory Visit | Attending: Radiation Oncology | Admitting: Radiation Oncology

## 2015-08-15 ENCOUNTER — Encounter: Payer: Self-pay | Admitting: Radiation Oncology

## 2015-08-15 VITALS — BP 118/59 | HR 65 | Temp 98.4°F | Resp 18 | Ht 66.5 in | Wt 206.9 lb

## 2015-08-15 DIAGNOSIS — M549 Dorsalgia, unspecified: Secondary | ICD-10-CM | POA: Insufficient documentation

## 2015-08-15 DIAGNOSIS — Z9181 History of falling: Secondary | ICD-10-CM | POA: Diagnosis not present

## 2015-08-15 DIAGNOSIS — Z08 Encounter for follow-up examination after completed treatment for malignant neoplasm: Secondary | ICD-10-CM | POA: Diagnosis not present

## 2015-08-15 DIAGNOSIS — D329 Benign neoplasm of meninges, unspecified: Secondary | ICD-10-CM | POA: Diagnosis not present

## 2015-08-15 DIAGNOSIS — J449 Chronic obstructive pulmonary disease, unspecified: Secondary | ICD-10-CM | POA: Insufficient documentation

## 2015-08-15 DIAGNOSIS — N400 Enlarged prostate without lower urinary tract symptoms: Secondary | ICD-10-CM | POA: Insufficient documentation

## 2015-08-15 DIAGNOSIS — R011 Cardiac murmur, unspecified: Secondary | ICD-10-CM | POA: Insufficient documentation

## 2015-08-15 DIAGNOSIS — K219 Gastro-esophageal reflux disease without esophagitis: Secondary | ICD-10-CM | POA: Insufficient documentation

## 2015-08-15 DIAGNOSIS — M5116 Intervertebral disc disorders with radiculopathy, lumbar region: Secondary | ICD-10-CM | POA: Diagnosis not present

## 2015-08-15 DIAGNOSIS — K449 Diaphragmatic hernia without obstruction or gangrene: Secondary | ICD-10-CM | POA: Diagnosis not present

## 2015-08-15 DIAGNOSIS — I10 Essential (primary) hypertension: Secondary | ICD-10-CM | POA: Diagnosis not present

## 2015-08-15 DIAGNOSIS — Z87891 Personal history of nicotine dependence: Secondary | ICD-10-CM | POA: Insufficient documentation

## 2015-08-15 DIAGNOSIS — Y842 Radiological procedure and radiotherapy as the cause of abnormal reaction of the patient, or of later complication, without mention of misadventure at the time of the procedure: Secondary | ICD-10-CM | POA: Insufficient documentation

## 2015-08-15 NOTE — Progress Notes (Signed)
Weight and vitals stable. Reports he continues to have an unsteady gait and numerous falls.  Reports ringing in the ears. Reports difficulty with his vision in the right eye especially after reading like a ghost image.  Reports Dr. Gershon Crane is aware of this but, no interventions have been ordered. Left eyelid droops.   Reports right  leg weakness more than left continues. Reports back pain continues while ambulating, using a cane. Wt Readings from Last 3 Encounters:  08/15/15 206 lb 14.4 oz (93.8 kg)  02/14/15 220 lb 3.2 oz (99.9 kg)  08/09/14 225 lb 8 oz (102.3 kg)  BP (!) 118/59 (BP Location: Right Arm, Patient Position: Sitting, Cuff Size: Normal)   Pulse 65   Temp 98.4 F (36.9 C) (Oral)   Resp 18   Ht 5' 6.5" (1.689 m)   Wt 206 lb 14.4 oz (93.8 kg)   SpO2 99%   BMI 32.89 kg/m

## 2015-08-15 NOTE — Addendum Note (Signed)
Encounter addended by: Malena Edman, RN on: 08/15/2015  5:25 PM<BR>    Actions taken: Charge Capture section accepted

## 2015-08-15 NOTE — Progress Notes (Signed)
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  Narrative:  The patient returns today for routine follow-up. The recent films were presented in our multidisciplinary conference with neuroradiology just prior to the clinic. His most recent MRI scan from 08/12/2015 that showed stable tumor of right tentorial meningioma. He has continued slight improvement of the right superior cerebellar signal abnormality compatible with radiation necrosis. No new intracranial abnormality.   On review of systems, he continues to have an unsteady gait and numerous falls.  and he reports difficulty with his vision in the right eye especially after reading. He continues to see Dr. Gershon Crane who is aware of this, but no interventions have been ordered. He continues to have lower extremity weakness and falls and reports his back pain is described as dull and sometimes sharp. He reports the pain gets worse when walking and is manageable when he sits or lies down all day. He has not tried water aerobics for exercise. He is interested in knowing what else he might try for his symptoms.  Past Medical History:  Past Medical History:  Diagnosis Date  . Arthritis   . BPH (benign prostatic hyperplasia)   . COPD (chronic obstructive pulmonary disease) ([place])   . Enlarged prostate   . GERD (gastroesophageal reflux disease)   . H/O hiatal hernia   . H/O wheezing    occ inhaler usage  . Heart murmur   . Hypertension   . Meningioma [facility])     Past Surgical History: Past Surgical History:  Procedure Laterality Date  . BACK SURGERY  06,07,08   lam x3 cerv x 1  . CERVICAL FUSION    . CYSTOSCOPY     65 yrs old  . EYE SURGERY     bil    . KNEE ARTHROSCOPY  10   lft  . KNEE ARTHROSCOPY Left 07/23/2012   Procedure: LEFT MEDIAL MENISCAL DEBRIDEMENT AND CHONDROPLASTY;  Surgeon: [name], MD;  Location: [facility] OR[facility];  Service: Orthopedics;  Laterality: Left;  . LUMBAR FUSION  02/22/2014   DR POOL  . PILONIDAL CYST EXCISION    . SINUS EXPLORATION    . WRIST GANGLION EXCISION     rt  . WRIST SURGERY  09   cyst lft    Social History:  Social History   Social History  . Marital status: Married    Spouse name: N/A  . Number of children: N/A  . Years of education: N/A   Occupational History  . Not on file.   Social History Main Topics  . Smoking status: Former Smoker    Packs/day: 2.00    Years: 0.00    Types: Cigarettes    Quit date: 05/03/2000  . Smokeless tobacco: Never Used     Comment: quit alcohol 73  . Alcohol use No  . Drug use: No  . Sexual activity: Not Currently   Other Topics Concern  . Not on file   Social History Narrative  . No narrative on file    Family History:No family history on file.  ALLERGIES:  is allergic to adhesive [tape] and other.  Meds: Current Outpatient Prescriptions  Medication Sig Dispense  Refill  . albuterol (PROVENTIL HFA;VENTOLIN HFA) 108 (90 BASE) MCG/ACT inhaler Inhale into the lungs every 6 (six) hours as needed for wheezing or shortness of breath.    Marland Kitchen aspirin 325 MG EC tablet Take 325 mg by mouth daily.    . diazepam (VALIUM) 5 MG tablet Take 1-2 tablets (5-10 mg total) by mouth every 6 (six) hours as needed for muscle spasms. 60 tablet 0  . diphenhydrAMINE (SOMINEX) 25 MG tablet Take 25 mg by mouth at bedtime as needed for sleep.     . fluticasone (FLONASE) 50 MCG/ACT nasal spray Place 2 sprays into both nostrils at bedtime.     . gabapentin (NEURONTIN) 300 MG capsule     . guaifenesin (HUMIBID E) 400 MG TABS Take 400 mg by mouth every 6 (six) hours as needed (for congestion). For mucus    . hydrochlorothiazide (HYDRODIURIL) 25 MG tablet Take 25 mg by mouth  every morning.     Marland Kitchen ibuprofen (ADVIL,MOTRIN) 200 MG tablet Take 600 mg by mouth every 6 (six) hours as needed for pain. For pain    . latanoprost (XALATAN) 0.005 % ophthalmic solution Place 1 drop into both eyes at bedtime.     . magnesium hydroxide (MILK OF MAGNESIA) 400 MG/5ML suspension Take 15 mLs by mouth at bedtime as needed for constipation. For constipation    . metoprolol succinate (TOPROL-XL) 100 MG 24 hr tablet Take 100 mg by mouth every evening. Take with or immediately following a meal.    . Multiple Vitamins-Minerals (MULTIVITAMIN ADULT PO) Take by mouth.    Marland Kitchen omeprazole (PRILOSEC) 40 MG capsule Take 40 mg by mouth every morning.    Marland Kitchen oxyCODONE-acetaminophen (PERCOCET/ROXICET) 5-325 MG per tablet Take 2 tablets by mouth every 4 (four) hours as needed for moderate pain.     Marland Kitchen oxymetazoline (AFRIN) 0.05 % nasal spray Place 2 sprays into both nostrils 2 (two) times daily as needed for congestion.    . pseudoephedrine (SUDAFED) 30 MG tablet Take 30 mg by mouth every 6 (six) hours as needed. For congestion    . ramipril (ALTACE) 10 MG capsule Take 10 mg by mouth every morning.     . Simethicone (GAS-X EXTRA STRENGTH PO) Take 1 tablet by mouth 4 (four) times daily as needed (for gas). For upset stomach    . simvastatin (ZOCOR) 20 MG tablet Take 20 mg by mouth every evening.    Marland Kitchen spironolactone (ALDACTONE) 25 MG tablet Take 12.5 mg by mouth every evening.     . Tamsulosin HCl (FLOMAX) 0.4 MG CAPS Take 0.4 mg by mouth 2 (two) times daily.    . vitamin E 400 UNIT capsule Take 400 Units by mouth daily.    Marland Kitchen ALPRAZolam (XANAX) 0.25 MG tablet Take 2 tablets (0.5 mg total) by mouth 3 (three) times daily as needed for sleep (20 min before MRI or radiation appointment). (Patient not taking: Reported on 08/09/2014) 30 tablet 0   No current facility-administered medications for this encounter.     Physical Findings: Vitals:   08/15/15 0931  BP: (!) 118/59  Pulse: 65  Resp: 18  Temp: 98.4 F  (36.9 C)  In general this is a well appearing Caucasian male in no acute distress. He's alert and oriented x4 and appropriate throughout the examination. Cardiopulmonary assessment is negative for acute distress and he exhibits normal effort. His spine is nontender to palpation and his gait is slow and steady with his cane.     Lab  Findings: Lab Results  Component Value Date   WBC 6.5 02/12/2014   HGB 17.0 02/12/2014   HCT 48.9 02/12/2014   MCV 88.9 02/12/2014   PLT 262 02/12/2014    @LASTCHEM @  Radiographic Findings: Mr Jeri Cos [id number] Contrast  09/08/2013   CLINICAL DATA:  Meningioma. Ten month restaging stereotactic radiosurgery  EXAM: MRI HEAD WITHOUT AND WITH CONTRAST  TECHNIQUE: Multiplanar, multiecho pulse sequences of the brain and surrounding structures were obtained without and with intravenous contrast.  CONTRAST:  56mL MULTIHANCE GADOBENATE DIMEGLUMINE 529 MG/ML IV SOLN  COMPARISON:  MRI 06/05/2013, 01/27/2013  FINDINGS: Right tentorial meningioma shows homogeneous enhancement. There is dural tail with thickening and enhancement of the right tentorium which is unchanged. The meningioma is slightly smaller prep fall 1 mm in size. The meningioma currently measures 9.5 x 14.4 x 14.8 mm, compared with 15.1 x 10.4 x 16.8 mm. Mild mass-effect on the right pons is unchanged. No edema in the pons.  No other enhancing lesions identified.  Generalized atrophy. Mild chronic microvascular ischemic change in the white matter.  Negative for acute infarct.  Mucosal edema in the paranasal sinuses.  IMPRESSION: Right tentorial meningioma appears slightly smaller by approximately 1 mm compared with the prior study. Mild flattening of the right pons without edema in the brainstem.  Mild chronic microvascular ischemic change.  No acute infarct.   Electronically Signed   By: Franchot Gallo M.D.   On: 09/08/2013 13:31     Impression:  The patient is recovering from the effects of radiation. We reviewed his  most recent MRI from 08/12/2015 that showed unchanged size of the right tentorial meningioma with no new intracranial abnormality.  Plan: He will have a repeat MRI in a year with follow up. We discussed vestibular physical therapy, but he does not believe it is causing him problems and believes his unsteady gait is due to his back pain. I recommend he continues to take Vitamin E, and we will follow up with Dr. Marchelle Folks suggestions for possible nerve block with pain management.      Carola Rhine, PAC   This document serves as a record of services personally performed by Dr. Tammi Klippel, MD and Shona Simpson, [facility]. It was created on their behalf by Lendon Collar, a trained medical scribe. The creation of this record is based on the scribe's personal observations and the provider's statements to them. This document has been checked and approved by the attending provider.

## 2015-08-18 DIAGNOSIS — H401131 Primary open-angle glaucoma, bilateral, mild stage: Secondary | ICD-10-CM | POA: Diagnosis not present

## 2015-09-07 DIAGNOSIS — I10 Essential (primary) hypertension: Secondary | ICD-10-CM | POA: Diagnosis not present

## 2015-09-07 DIAGNOSIS — Z23 Encounter for immunization: Secondary | ICD-10-CM | POA: Diagnosis not present

## 2015-09-07 DIAGNOSIS — M5441 Lumbago with sciatica, right side: Secondary | ICD-10-CM | POA: Diagnosis not present

## 2015-09-07 DIAGNOSIS — G8929 Other chronic pain: Secondary | ICD-10-CM | POA: Diagnosis not present

## 2015-09-07 DIAGNOSIS — M5442 Lumbago with sciatica, left side: Secondary | ICD-10-CM | POA: Diagnosis not present

## 2015-11-17 DIAGNOSIS — M48062 Spinal stenosis, lumbar region with neurogenic claudication: Secondary | ICD-10-CM | POA: Diagnosis not present

## 2015-11-21 ENCOUNTER — Other Ambulatory Visit: Payer: Self-pay | Admitting: Neurosurgery

## 2015-11-21 DIAGNOSIS — M48062 Spinal stenosis, lumbar region with neurogenic claudication: Secondary | ICD-10-CM

## 2015-11-21 DIAGNOSIS — H401131 Primary open-angle glaucoma, bilateral, mild stage: Secondary | ICD-10-CM | POA: Diagnosis not present

## 2015-12-06 ENCOUNTER — Ambulatory Visit
Admission: RE | Admit: 2015-12-06 | Discharge: 2015-12-06 | Disposition: A | Discharge status: Home / Self Care | Payer: Medicare Other | Source: Ambulatory Visit | Attending: Neurosurgery | Admitting: Neurosurgery

## 2015-12-06 DIAGNOSIS — M48062 Spinal stenosis, lumbar region with neurogenic claudication: Secondary | ICD-10-CM

## 2015-12-06 DIAGNOSIS — M48061 Spinal stenosis, lumbar region without neurogenic claudication: Secondary | ICD-10-CM | POA: Diagnosis not present

## 2015-12-06 MED ORDER — GADOBENATE DIMEGLUMINE 529 MG/ML IV SOLN
20.0000 mL | Freq: Once | INTRAVENOUS | Status: AC | PRN
  Administered 2015-12-06: 18 mL via INTRAVENOUS

## 2015-12-21 DIAGNOSIS — I10 Essential (primary) hypertension: Secondary | ICD-10-CM | POA: Diagnosis not present

## 2015-12-21 DIAGNOSIS — Z6833 Body mass index (BMI) 33.0-33.9, adult: Secondary | ICD-10-CM | POA: Diagnosis not present

## 2015-12-21 DIAGNOSIS — M48062 Spinal stenosis, lumbar region with neurogenic claudication: Secondary | ICD-10-CM | POA: Diagnosis not present

## 2016-01-10 DIAGNOSIS — M5417 Radiculopathy, lumbosacral region: Secondary | ICD-10-CM | POA: Diagnosis not present

## 2016-01-10 DIAGNOSIS — M48062 Spinal stenosis, lumbar region with neurogenic claudication: Secondary | ICD-10-CM | POA: Diagnosis not present

## 2016-01-10 DIAGNOSIS — M47817 Spondylosis without myelopathy or radiculopathy, lumbosacral region: Secondary | ICD-10-CM | POA: Diagnosis not present

## 2016-01-10 DIAGNOSIS — F1120 Opioid dependence, uncomplicated: Secondary | ICD-10-CM | POA: Insufficient documentation

## 2016-01-25 DIAGNOSIS — M48062 Spinal stenosis, lumbar region with neurogenic claudication: Secondary | ICD-10-CM | POA: Diagnosis not present

## 2016-01-25 DIAGNOSIS — Z6833 Body mass index (BMI) 33.0-33.9, adult: Secondary | ICD-10-CM | POA: Diagnosis not present

## 2016-01-25 DIAGNOSIS — I10 Essential (primary) hypertension: Secondary | ICD-10-CM | POA: Diagnosis not present

## 2016-01-25 DIAGNOSIS — M5417 Radiculopathy, lumbosacral region: Secondary | ICD-10-CM | POA: Diagnosis not present

## 2016-02-13 ENCOUNTER — Other Ambulatory Visit: Payer: Self-pay | Admitting: Geriatric Medicine

## 2016-02-13 DIAGNOSIS — M5441 Lumbago with sciatica, right side: Secondary | ICD-10-CM | POA: Diagnosis not present

## 2016-02-13 DIAGNOSIS — I10 Essential (primary) hypertension: Secondary | ICD-10-CM | POA: Diagnosis not present

## 2016-02-13 DIAGNOSIS — Z6833 Body mass index (BMI) 33.0-33.9, adult: Secondary | ICD-10-CM | POA: Diagnosis not present

## 2016-02-13 DIAGNOSIS — Z0000 Encounter for general adult medical examination without abnormal findings: Secondary | ICD-10-CM | POA: Diagnosis not present

## 2016-02-13 DIAGNOSIS — R911 Solitary pulmonary nodule: Secondary | ICD-10-CM

## 2016-02-13 DIAGNOSIS — Z125 Encounter for screening for malignant neoplasm of prostate: Secondary | ICD-10-CM | POA: Diagnosis not present

## 2016-02-13 DIAGNOSIS — Z1389 Encounter for screening for other disorder: Secondary | ICD-10-CM | POA: Diagnosis not present

## 2016-02-13 DIAGNOSIS — E669 Obesity, unspecified: Secondary | ICD-10-CM | POA: Diagnosis not present

## 2016-02-13 DIAGNOSIS — E7800 Pure hypercholesterolemia, unspecified: Secondary | ICD-10-CM | POA: Diagnosis not present

## 2016-02-13 DIAGNOSIS — J449 Chronic obstructive pulmonary disease, unspecified: Secondary | ICD-10-CM | POA: Diagnosis not present

## 2016-02-13 DIAGNOSIS — D329 Benign neoplasm of meninges, unspecified: Secondary | ICD-10-CM | POA: Diagnosis not present

## 2016-02-13 DIAGNOSIS — M5442 Lumbago with sciatica, left side: Secondary | ICD-10-CM | POA: Diagnosis not present

## 2016-02-13 DIAGNOSIS — I77819 Aortic ectasia, unspecified site: Secondary | ICD-10-CM | POA: Diagnosis not present

## 2016-02-13 DIAGNOSIS — G8929 Other chronic pain: Secondary | ICD-10-CM | POA: Diagnosis not present

## 2016-02-13 DIAGNOSIS — K219 Gastro-esophageal reflux disease without esophagitis: Secondary | ICD-10-CM | POA: Diagnosis not present

## 2016-02-13 DIAGNOSIS — Z79899 Other long term (current) drug therapy: Secondary | ICD-10-CM | POA: Diagnosis not present

## 2016-02-14 ENCOUNTER — Ambulatory Visit
Admission: RE | Admit: 2016-02-14 | Discharge: 2016-02-14 | Disposition: A | Discharge status: Home / Self Care | Payer: Medicare Other | Source: Ambulatory Visit | Attending: Geriatric Medicine | Admitting: Geriatric Medicine

## 2016-02-14 DIAGNOSIS — R911 Solitary pulmonary nodule: Secondary | ICD-10-CM

## 2016-02-14 DIAGNOSIS — I10 Essential (primary) hypertension: Secondary | ICD-10-CM | POA: Diagnosis not present

## 2016-02-16 DIAGNOSIS — M48062 Spinal stenosis, lumbar region with neurogenic claudication: Secondary | ICD-10-CM | POA: Diagnosis not present

## 2016-02-20 DIAGNOSIS — H401131 Primary open-angle glaucoma, bilateral, mild stage: Secondary | ICD-10-CM | POA: Diagnosis not present

## 2016-02-20 DIAGNOSIS — Z961 Presence of intraocular lens: Secondary | ICD-10-CM | POA: Diagnosis not present

## 2016-03-07 DIAGNOSIS — M48062 Spinal stenosis, lumbar region with neurogenic claudication: Secondary | ICD-10-CM | POA: Diagnosis not present

## 2016-03-07 DIAGNOSIS — Z6833 Body mass index (BMI) 33.0-33.9, adult: Secondary | ICD-10-CM | POA: Diagnosis not present

## 2016-03-07 DIAGNOSIS — M5417 Radiculopathy, lumbosacral region: Secondary | ICD-10-CM | POA: Diagnosis not present

## 2016-04-11 DIAGNOSIS — M5417 Radiculopathy, lumbosacral region: Secondary | ICD-10-CM | POA: Diagnosis not present

## 2016-04-25 DIAGNOSIS — M5417 Radiculopathy, lumbosacral region: Secondary | ICD-10-CM | POA: Diagnosis not present

## 2016-05-21 DIAGNOSIS — H401131 Primary open-angle glaucoma, bilateral, mild stage: Secondary | ICD-10-CM | POA: Diagnosis not present

## 2016-05-23 DIAGNOSIS — M5417 Radiculopathy, lumbosacral region: Secondary | ICD-10-CM | POA: Diagnosis not present

## 2016-05-23 DIAGNOSIS — Z6834 Body mass index (BMI) 34.0-34.9, adult: Secondary | ICD-10-CM | POA: Diagnosis not present

## 2016-06-07 DIAGNOSIS — D320 Benign neoplasm of cerebral meninges: Secondary | ICD-10-CM | POA: Diagnosis not present

## 2016-06-07 DIAGNOSIS — M48062 Spinal stenosis, lumbar region with neurogenic claudication: Secondary | ICD-10-CM | POA: Diagnosis not present

## 2016-06-07 DIAGNOSIS — I10 Essential (primary) hypertension: Secondary | ICD-10-CM | POA: Diagnosis not present

## 2016-06-07 DIAGNOSIS — M5417 Radiculopathy, lumbosacral region: Secondary | ICD-10-CM | POA: Diagnosis not present

## 2016-06-07 DIAGNOSIS — M47817 Spondylosis without myelopathy or radiculopathy, lumbosacral region: Secondary | ICD-10-CM | POA: Diagnosis not present

## 2016-06-07 DIAGNOSIS — Z6833 Body mass index (BMI) 33.0-33.9, adult: Secondary | ICD-10-CM | POA: Diagnosis not present

## 2016-06-13 DIAGNOSIS — M47817 Spondylosis without myelopathy or radiculopathy, lumbosacral region: Secondary | ICD-10-CM | POA: Diagnosis not present

## 2016-06-13 DIAGNOSIS — I10 Essential (primary) hypertension: Secondary | ICD-10-CM | POA: Diagnosis not present

## 2016-06-13 DIAGNOSIS — Z6833 Body mass index (BMI) 33.0-33.9, adult: Secondary | ICD-10-CM | POA: Diagnosis not present

## 2016-06-21 ENCOUNTER — Other Ambulatory Visit: Payer: Self-pay | Admitting: Radiation Therapy

## 2016-06-21 DIAGNOSIS — D329 Benign neoplasm of meninges, unspecified: Secondary | ICD-10-CM

## 2016-07-10 DIAGNOSIS — M47817 Spondylosis without myelopathy or radiculopathy, lumbosacral region: Secondary | ICD-10-CM | POA: Diagnosis not present

## 2016-07-26 DIAGNOSIS — M47817 Spondylosis without myelopathy or radiculopathy, lumbosacral region: Secondary | ICD-10-CM | POA: Diagnosis not present

## 2016-08-01 NOTE — Progress Notes (Addendum)
Cody Tate 66 y.o. man with right tentorial meningioma compressing the brainstem s/p SRS 02/04/2013 to 15 Gy,review 08-09-16 MRI brain w wo contrast,one year FU.   Headche:Having sinus headaches occasional Sudafed with relief.  Complaints of pain to back and legs 5/10 taking Oxycodone and Ibuprofen with some relief. Dizziness:Yes a few weeks ago,none today. Nausea/vomiting:None Ringing in ears:Yes has ringing in both ears some days that comes and goes. Visual changes,Diplopia:Having blurred vision sometimes when he reads a lot.  Had a problem focusing to see a fifty inch T.V. screen. Fatigue:Reports fatigue.  "Says he is having a problem with his balance due to his back issues, no mention of having recent physical therapy. Cognitive changes:Having memory problems and forgetful. Imaging: 08-09-16 MRI brain Weight: Wt Readings from Last 3 Encounters:  08/13/16 214 lb 6.4 oz (97.3 kg)  08/15/15 206 lb 14.4 oz (93.8 kg)  02/14/15 220 lb 3.2 oz (99.9 kg)  BP 133/79   Pulse 80   Temp 98.5 F (36.9 C) (Oral)   Resp 18   Ht 5' 6.5" (1.689 m)   Wt 214 lb 6.4 oz (97.3 kg)   SpO2 100%   BMI 34.09 kg/m

## 2016-08-09 ENCOUNTER — Ambulatory Visit
Admission: RE | Admit: 2016-08-09 | Discharge: 2016-08-09 | Disposition: A | Discharge status: Home / Self Care | Payer: Medicare Other | Source: Ambulatory Visit | Attending: Radiation Oncology | Admitting: Radiation Oncology

## 2016-08-09 DIAGNOSIS — D329 Benign neoplasm of meninges, unspecified: Secondary | ICD-10-CM | POA: Diagnosis not present

## 2016-08-09 MED ORDER — GADOBENATE DIMEGLUMINE 529 MG/ML IV SOLN
20.0000 mL | Freq: Once | INTRAVENOUS | Status: AC | PRN
  Administered 2016-08-09: 20 mL via INTRAVENOUS

## 2016-08-13 ENCOUNTER — Ambulatory Visit
Admission: RE | Admit: 2016-08-13 | Discharge: 2016-08-13 | Disposition: A | Discharge status: Home / Self Care | Payer: Medicare Other | Source: Ambulatory Visit | Attending: Radiation Oncology | Admitting: Radiation Oncology

## 2016-08-13 ENCOUNTER — Encounter: Payer: Self-pay | Admitting: Radiation Oncology

## 2016-08-13 VITALS — BP 133/79 | HR 80 | Temp 98.5°F | Resp 18 | Ht 66.5 in | Wt 214.4 lb

## 2016-08-13 DIAGNOSIS — Z6834 Body mass index (BMI) 34.0-34.9, adult: Secondary | ICD-10-CM | POA: Diagnosis not present

## 2016-08-13 DIAGNOSIS — M5442 Lumbago with sciatica, left side: Secondary | ICD-10-CM | POA: Diagnosis not present

## 2016-08-13 DIAGNOSIS — Z86018 Personal history of other benign neoplasm: Secondary | ICD-10-CM | POA: Diagnosis not present

## 2016-08-13 DIAGNOSIS — M5441 Lumbago with sciatica, right side: Secondary | ICD-10-CM | POA: Diagnosis not present

## 2016-08-13 DIAGNOSIS — D329 Benign neoplasm of meninges, unspecified: Secondary | ICD-10-CM | POA: Diagnosis not present

## 2016-08-13 DIAGNOSIS — E669 Obesity, unspecified: Secondary | ICD-10-CM | POA: Diagnosis not present

## 2016-08-13 DIAGNOSIS — Z08 Encounter for follow-up examination after completed treatment for malignant neoplasm: Secondary | ICD-10-CM | POA: Diagnosis not present

## 2016-08-13 DIAGNOSIS — K219 Gastro-esophageal reflux disease without esophagitis: Secondary | ICD-10-CM | POA: Diagnosis not present

## 2016-08-13 DIAGNOSIS — M545 Low back pain: Secondary | ICD-10-CM | POA: Diagnosis not present

## 2016-08-13 DIAGNOSIS — J449 Chronic obstructive pulmonary disease, unspecified: Secondary | ICD-10-CM | POA: Insufficient documentation

## 2016-08-13 DIAGNOSIS — I10 Essential (primary) hypertension: Secondary | ICD-10-CM | POA: Diagnosis not present

## 2016-08-13 DIAGNOSIS — R2681 Unsteadiness on feet: Secondary | ICD-10-CM | POA: Diagnosis not present

## 2016-08-13 NOTE — Progress Notes (Signed)
Radiation Oncology         [phone]    Multidisciplinary Brain and Spine Oncology Clinic Follow-Up Visit Note  CC: [name], MD  [name], MD  Diagnosis: 66 y.o.  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  Interval Since Last Radiation: 3 years, 6 months  02/04/13 SRS Treatment: 15 Gy to right tentorial meningioma  Narrative:  The patient returns today for routine follow-up. In summary he is a pleasant 66 y.o. gentleman who was treated over three years ago with SRS to a tentorial meningioma that was causing compression of the brainstem. He has been followed since his treatment without recurrence or progression. He does have ongoing issues with low back pain as a result of lumbar fusion. He continues to follow with Dr. Assunta Curtis in pain management and with Dr. Trenton Gammon. He reports that he's been told by Dr. Trenton Gammon that there is a high likelihood that he will need fusion of L5, but that he has been encouraged to try all other approaches prior to surgery, as this type of surgery may not address some of the balance issues that have been created by spinal nerve compression.The recent films were presented in our multidisciplinary conference with neuroradiology just prior to the clinic. His most recent MRI scan from 08/09/16 that showed stable characteristics of his previously treated right tentorial meningioma.   On review of systems, the patient reports that he is doing well overall despite his ongoing issues with back pain and balance as a result of previous spinal nerve root damage. He denies any concerns with progressive dizziness, but states that he just feels off balance. He has tried PT in the past for strengthening exercises for his back, but not attempted any PT focused on improving balance. He denies any headaches, visual, auditory, or speech changes. He denies any chest pain, shortness of breath, cough, fevers, chills, night sweats, unintended  weight changes. He denies any bowel or bladder disturbances, and denies abdominal pain, nausea or vomiting. He denies any new musculoskeletal or joint aches or pains, new skin lesions or concerns. A complete review of systems is obtained and is otherwise negative.   Past Medical History:  Past Medical History:  Diagnosis Date  . Arthritis   . BPH (benign prostatic hyperplasia)   . COPD (chronic obstructive pulmonary disease) ([place])   . Enlarged prostate   . GERD (gastroesophageal reflux disease)   . H/O hiatal hernia   . H/O wheezing    occ inhaler usage  . Heart murmur   . Hypertension   . Meningioma [facility])     Past Surgical History: Past Surgical History:  Procedure Laterality Date  . BACK SURGERY  06,07,08   lam x3 cerv x 1  . CERVICAL FUSION    . CYSTOSCOPY     66 yrs old  . EYE SURGERY     bil  . KNEE ARTHROSCOPY  10   lft  . KNEE ARTHROSCOPY Left 07/23/2012   Procedure: LEFT MEDIAL MENISCAL DEBRIDEMENT AND CHONDROPLASTY;  Surgeon: [name], MD;  Location: [facility];  Service: Orthopedics;  Laterality: Left;  . LUMBAR FUSION  02/22/2014   DR POOL  . PILONIDAL CYST EXCISION    . SINUS EXPLORATION    . WRIST GANGLION EXCISION     rt  . WRIST SURGERY  09   cyst lft    Social History:  Social History   Social History  . Marital status: Married  Spouse name: N/A  . Number of children: N/A  . Years of education: N/A   Occupational History  . Not on file.   Social History Main Topics  . Smoking status: Former Smoker    Packs/day: 2.00    Years: 0.00    Types: Cigarettes    Quit date: 05/03/2000  . Smokeless tobacco: Never Used     Comment: quit alcohol 73  . Alcohol use No  . Drug use: No  . Sexual activity: Not Currently   Other Topics Concern  . Not on file   Social History Narrative  . No narrative on file  The patient is married and retired. He lives in [place].  Family History: No known family history of meningiomas.  ALLERGIES:  is  allergic to adhesive [tape] and other.  Meds: Current Outpatient Prescriptions  Medication Sig Dispense Refill  . albuterol (PROVENTIL HFA;VENTOLIN HFA) 108 (90 BASE) MCG/ACT inhaler Inhale into the lungs every 6 (six) hours as needed for wheezing or shortness of breath.    . ALPRAZolam (XANAX) 0.25 MG tablet Take 2 tablets (0.5 mg total) by mouth 3 (three) times daily as needed for sleep (20 min before MRI or radiation appointment). (Patient not taking: Reported on 08/09/2014) 30 tablet 0  . aspirin 325 MG EC tablet Take 325 mg by mouth daily.    . diazepam (VALIUM) 5 MG tablet Take 1-2 tablets (5-10 mg total) by mouth every 6 (six) hours as needed for muscle spasms. 60 tablet 0  . diphenhydrAMINE (SOMINEX) 25 MG tablet Take 25 mg by mouth at bedtime as needed for sleep.     . fluticasone (FLONASE) 50 MCG/ACT nasal spray Place 2 sprays into both nostrils at bedtime.     . gabapentin (NEURONTIN) 300 MG capsule     . guaifenesin (HUMIBID E) 400 MG TABS Take 400 mg by mouth every 6 (six) hours as needed (for congestion). For mucus    . hydrochlorothiazide (HYDRODIURIL) 25 MG tablet Take 25 mg by mouth every morning.     Marland Kitchen ibuprofen (ADVIL,MOTRIN) 200 MG tablet Take 600 mg by mouth every 6 (six) hours as needed for pain. For pain    . latanoprost (XALATAN) 0.005 % ophthalmic solution Place 1 drop into both eyes at bedtime.     . magnesium hydroxide (MILK OF MAGNESIA) 400 MG/5ML suspension Take 15 mLs by mouth at bedtime as needed for constipation. For constipation    . metoprolol succinate (TOPROL-XL) 100 MG 24 hr tablet Take 100 mg by mouth every evening. Take with or immediately following a meal.    . Multiple Vitamins-Minerals (MULTIVITAMIN ADULT PO) Take by mouth.    Marland Kitchen omeprazole (PRILOSEC) 40 MG capsule Take 40 mg by mouth every morning.    Marland Kitchen oxyCODONE-acetaminophen (PERCOCET/ROXICET) 5-325 MG per tablet Take 2 tablets by mouth every 4 (four) hours as needed for moderate pain.     Marland Kitchen  oxymetazoline (AFRIN) 0.05 % nasal spray Place 2 sprays into both nostrils 2 (two) times daily as needed for congestion.    . pseudoephedrine (SUDAFED) 30 MG tablet Take 30 mg by mouth every 6 (six) hours as needed. For congestion    . ramipril (ALTACE) 10 MG capsule Take 10 mg by mouth every morning.     . Simethicone (GAS-X EXTRA STRENGTH PO) Take 1 tablet by mouth 4 (four) times daily as needed (for gas). For upset stomach    . simvastatin (ZOCOR) 20 MG tablet Take 20 mg by mouth every  evening.    Marland Kitchen spironolactone (ALDACTONE) 25 MG tablet Take 12.5 mg by mouth every evening.     . Tamsulosin HCl (FLOMAX) 0.4 MG CAPS Take 0.4 mg by mouth 2 (two) times daily.    . vitamin E 400 UNIT capsule Take 400 Units by mouth daily.     No current facility-administered medications for this encounter.     Physical Findings: Wt Readings from Last 3 Encounters:  08/15/15 206 lb 14.4 oz (93.8 kg)  02/14/15 220 lb 3.2 oz (99.9 kg)  08/09/14 225 lb 8 oz (102.3 kg)   Temp Readings from Last 3 Encounters:  08/15/15 98.4 F (36.9 C) (Oral)  02/23/14 98.2 F (36.8 C) (Oral)  02/12/14 98 F (36.7 C)   BP Readings from Last 3 Encounters:  08/15/15 (!) 118/59  02/14/15 127/74  08/09/14 135/65   Pulse Readings from Last 3 Encounters:  08/15/15 65  02/14/15 68  08/09/14 69   In general this is a well appearing Caucasian male in no acute distress. He's alert and oriented x4 and appropriate throughout the examination. Cardiopulmonary assessment is negative for acute distress and he exhibits normal effort. His spine is nontender to palpation and his gait is slow and steady with his walker. He appears to be grossly intact neurologically.      Impression/Plan:   1.  Tentorial Meningioma, s/p SRS Treatment. The patient appears to be clinically and radiographically stable since his therapy. We discussed continuing to be followed in our brain and spine conference annually per the recommendations of the  conference this morning. He is in agreement with this plan and will contact us sooner if he has questions or concerns. 2. Back pain and poor balance. The patient is interested in talking with his pain management specialist and neurosurgeon to see if any focused PT would be an option. We will follow this expectantly at subsequent visits.    Carola Rhine, PAC

## 2016-08-22 DIAGNOSIS — M47817 Spondylosis without myelopathy or radiculopathy, lumbosacral region: Secondary | ICD-10-CM | POA: Diagnosis not present

## 2016-08-22 DIAGNOSIS — Z6834 Body mass index (BMI) 34.0-34.9, adult: Secondary | ICD-10-CM | POA: Diagnosis not present

## 2016-08-22 DIAGNOSIS — I10 Essential (primary) hypertension: Secondary | ICD-10-CM | POA: Diagnosis not present

## 2016-09-17 DIAGNOSIS — E669 Obesity, unspecified: Secondary | ICD-10-CM | POA: Diagnosis not present

## 2016-09-17 DIAGNOSIS — Z23 Encounter for immunization: Secondary | ICD-10-CM | POA: Diagnosis not present

## 2016-09-17 DIAGNOSIS — Z6834 Body mass index (BMI) 34.0-34.9, adult: Secondary | ICD-10-CM | POA: Diagnosis not present

## 2016-09-17 DIAGNOSIS — G894 Chronic pain syndrome: Secondary | ICD-10-CM | POA: Diagnosis not present

## 2016-09-20 DIAGNOSIS — Z6834 Body mass index (BMI) 34.0-34.9, adult: Secondary | ICD-10-CM | POA: Diagnosis not present

## 2016-09-20 DIAGNOSIS — M461 Sacroiliitis, not elsewhere classified: Secondary | ICD-10-CM | POA: Insufficient documentation

## 2016-09-20 DIAGNOSIS — M47817 Spondylosis without myelopathy or radiculopathy, lumbosacral region: Secondary | ICD-10-CM | POA: Diagnosis not present

## 2016-09-20 DIAGNOSIS — I10 Essential (primary) hypertension: Secondary | ICD-10-CM | POA: Diagnosis not present

## 2016-10-03 DIAGNOSIS — Z6834 Body mass index (BMI) 34.0-34.9, adult: Secondary | ICD-10-CM | POA: Diagnosis not present

## 2016-10-03 DIAGNOSIS — I10 Essential (primary) hypertension: Secondary | ICD-10-CM | POA: Diagnosis not present

## 2016-10-03 DIAGNOSIS — M461 Sacroiliitis, not elsewhere classified: Secondary | ICD-10-CM | POA: Diagnosis not present

## 2016-10-14 IMAGING — MR MR LUMBAR SPINE WO/W CM
5 of 8 series · 27 of 48 positions shown · IV contrast (multihance)
Comparison: Radiography 06/25/2012.  MRI 06/12/2012.

CLINICAL DATA: Lumbar fusion in 0161. Previous lumbar
decompression. Recurrent in worsening back pain and bilateral leg
pain right worse than left. Symptoms worsening over the last 2
months.

BUN and creatinine were obtained on site at [HOSPITAL] at
[HOSPITAL].
Results:  BUN 25 mg/dL,  Creatinine 1.0 mg/dL.
EXAM:
MRI LUMBAR SPINE WITHOUT AND WITH CONTRAST
TECHNIQUE: Multiplanar and multiecho pulse sequences of the lumbar spine were
obtained without and with intravenous contrast.
CONTRAST:  20mL MULTIHANCE GADOBENATE DIMEGLUMINE 529 MG/ML IV SOLN

[Series 4: T1 · sagittal · 4.0mm · 0.55mm/px · 4 of 12 slices shown (1 of 2)]
[im 1/12]
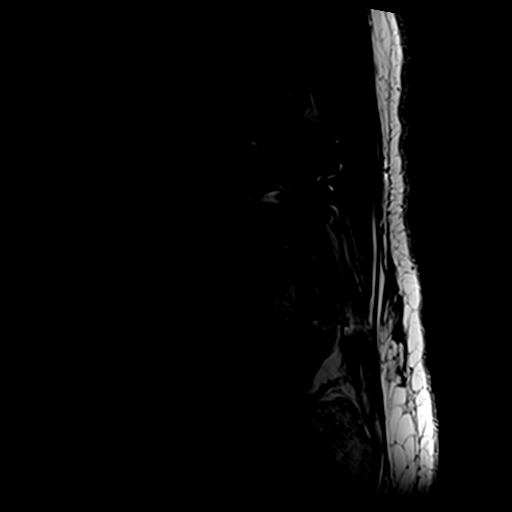
[im 4/12]
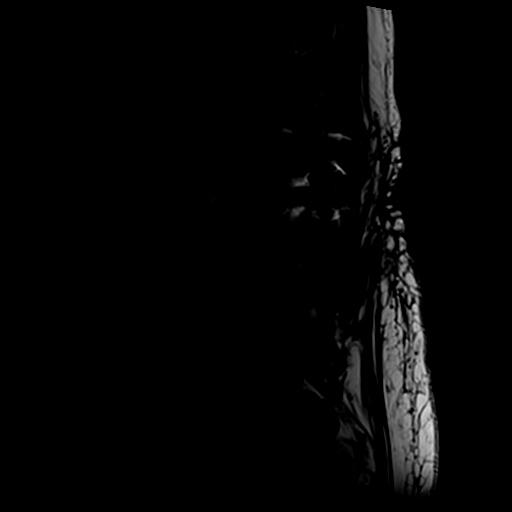
[im 8/12]
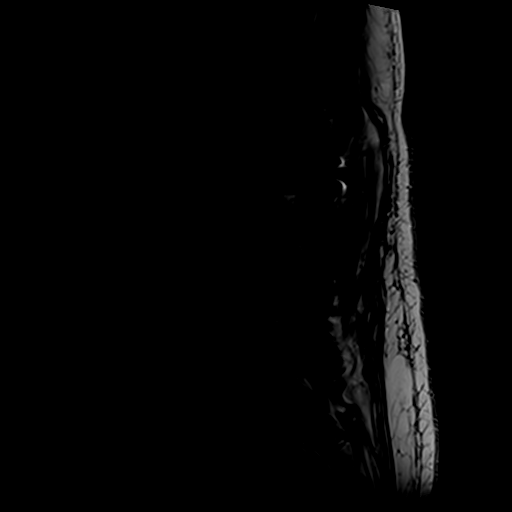
[im 12/12]
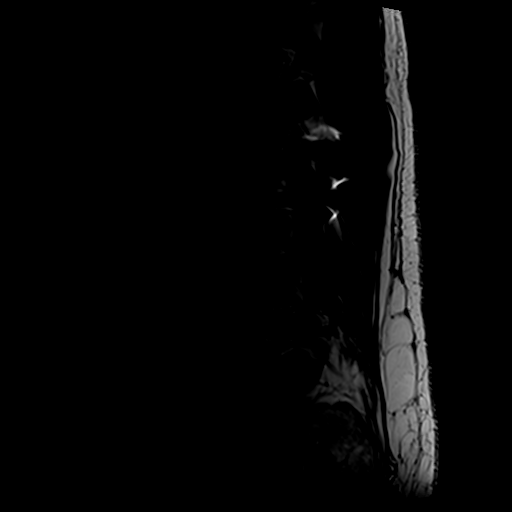

[Series 5: T2 · axial · 4.0mm · 0.78mm/px · z∈[-65,+126]mm · 11 of 35 slices shown (1 of 2)]
[im 1/35]
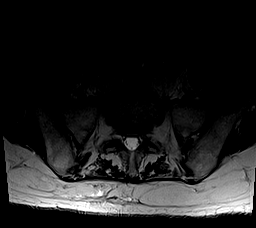
[im 4/35]
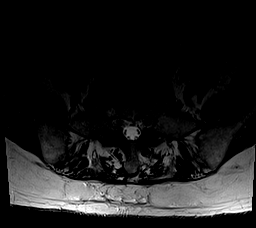
[im 7/35]
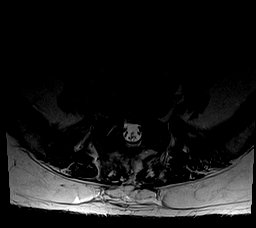
[im 11/35]
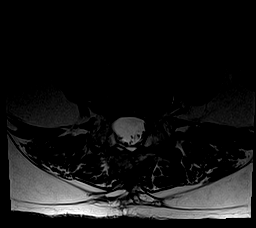
[im 14/35]
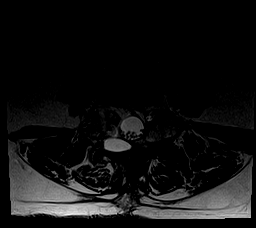
[im 18/35]
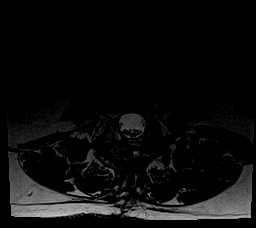
[im 21/35]
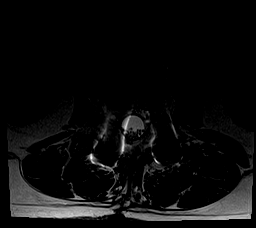
[im 24/35]
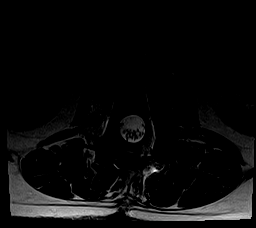
[im 28/35]
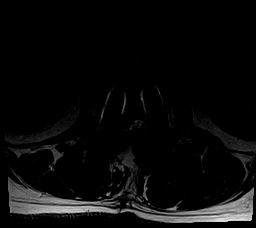
[im 31/35]
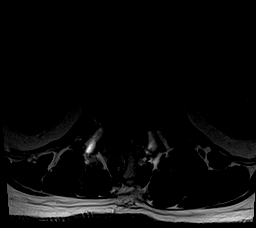
[im 35/35]
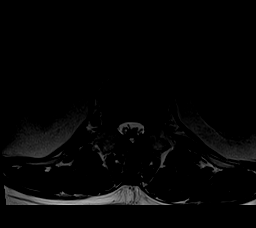

[Series 6: T1 · axial · 4.0mm · 0.39mm/px · z∈[-65,+126]mm · 8 of 35 slices shown (2 of 2)]
[im 1/35]
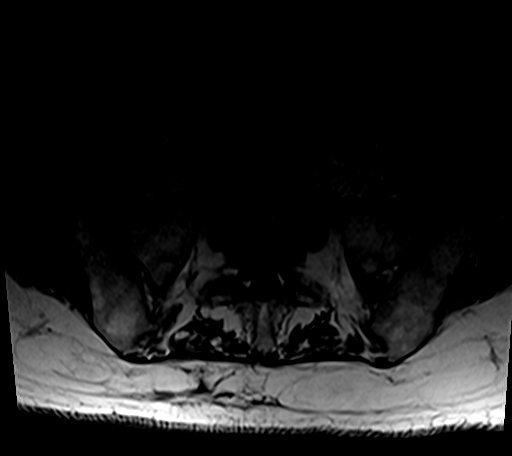
[im 4/35]
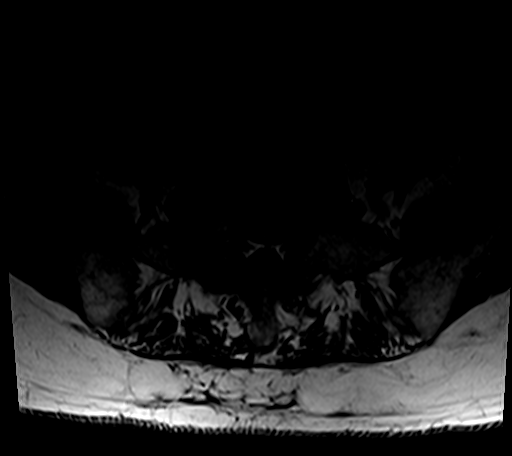
[im 12/35]
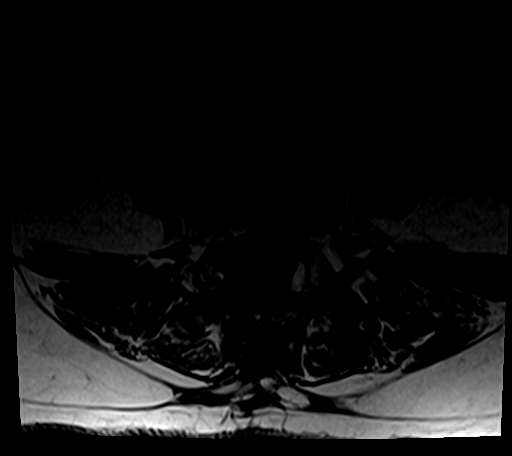
[im 16/35]
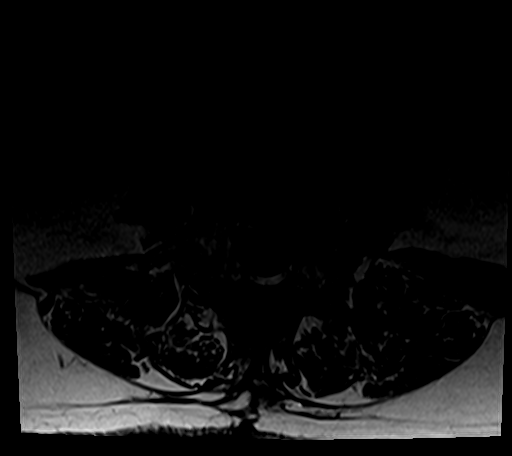
[im 19/35]
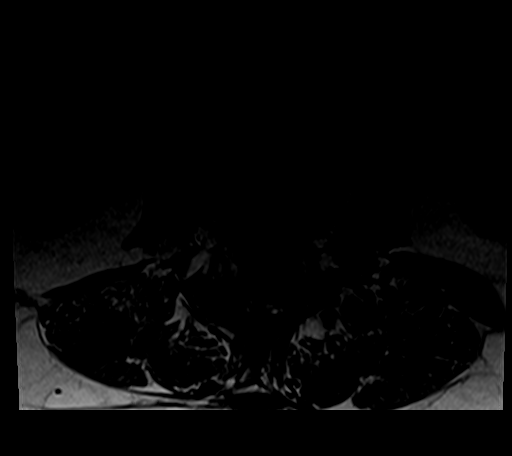
[im 23/35]
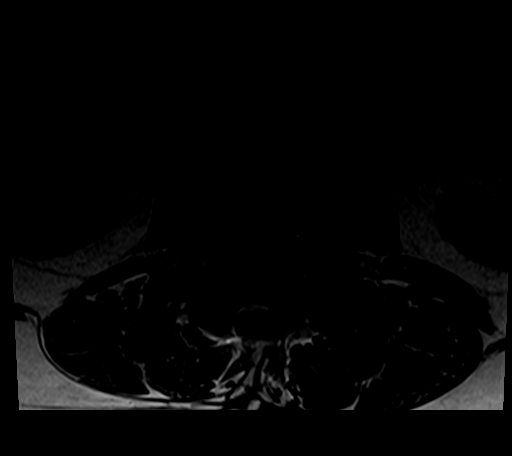
[im 31/35]
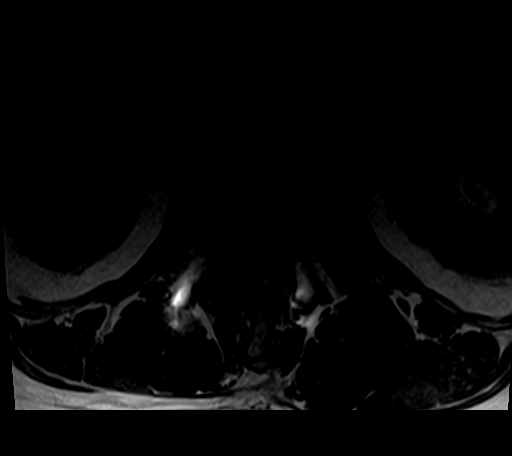
[im 35/35]
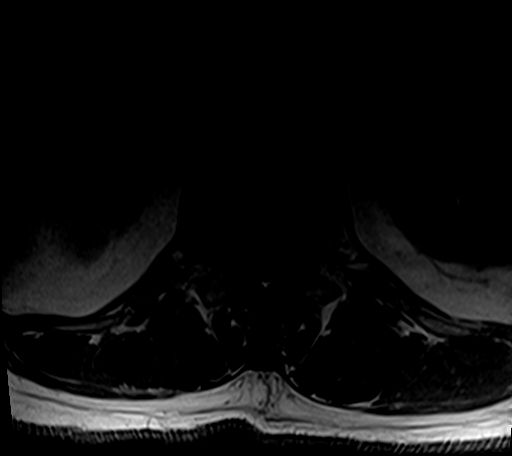

[Series 7: T2 · sagittal · 4.0mm · 0.55mm/px · 3 of 12 slices shown (2 of 2)]
[im 1/12]
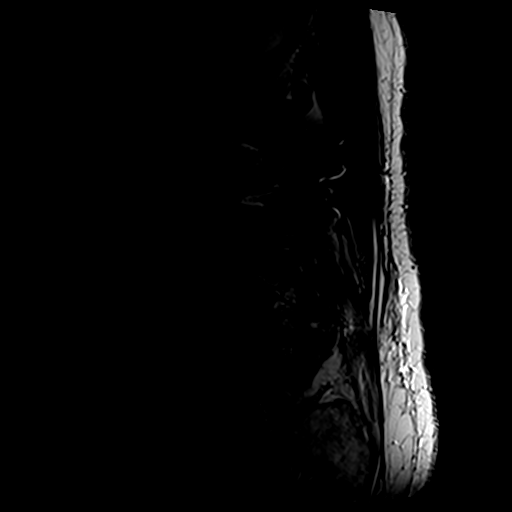
[im 6/12]
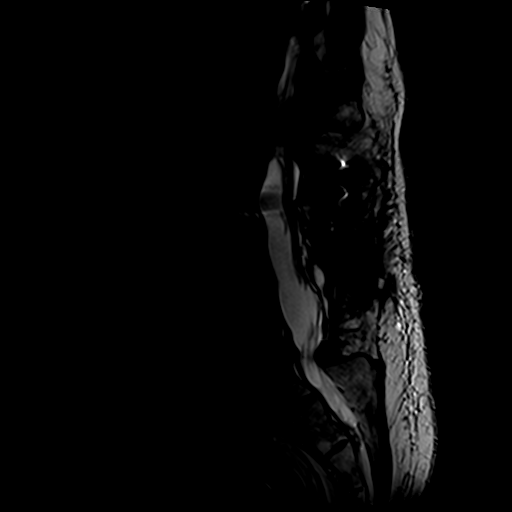
[im 12/12]
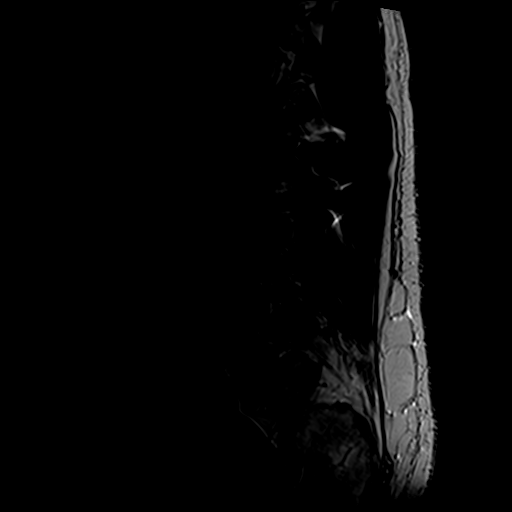

[Series 8: T1 fat-sat post-contrast · sagittal · 4.0mm · 0.55mm/px · 1 of 12 slices shown]
[im 1/12]
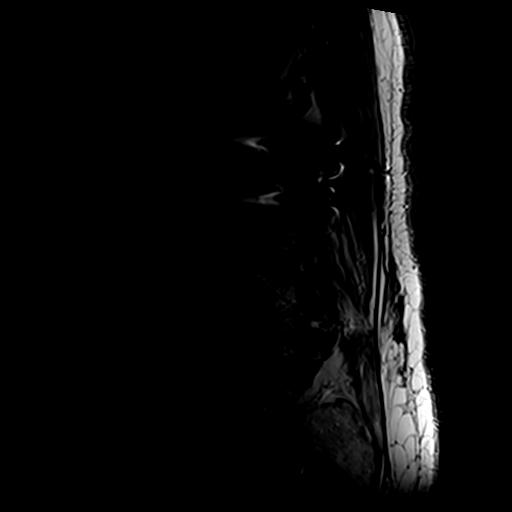

[27 of 48 positions shown; findings below may reference images not displayed]

FINDINGS: T11-12: Bulging of the disc.  No apparent neural compression.

T12-L1:  Normal interspace.

L1-2: Development of pronounced adjacent segment degenerative
disease. Disc degeneration with loss of disc height and broad-based
disc herniation with upward migration of disc material centrally and
slightly upward towards the left. Large disc fragment migrated
caudally on the right behind L2. Discogenic edema within the L1 and
L2 vertebral bodies. Severe spinal stenosis that could cause neural
compression on either or both sides.

L2-3: Previous decompression, diskectomy and fusion. Canal and
foramina appear widely patent. Question if the pedicle screw on the
right at L3 could be partially medial to the pedicle.

L3-4: Distant discectomy and fusion. Wide patency of the canal and
foramina.

L4-5: Distant discectomy and fusion. Wide patency of the canal and
foramina. Apparent chronic dural disruption on the right with a
small pseudomeningocele along the right side and posterior to the
thecal sac. No change since the previous exam. Nerve root clumping
consistent with arachnoiditis.

L5-S1: Disc degeneration with broad-based herniation of disc
material. Mild facet and ligamentous hypertrophy. Stenosis of the
subarticular lateral recesses and foramina that could possibly cause
neural compression. Similar appearance to the previous exam.
IMPRESSION: The only change since the previous study is the development of
worsened and advanced adjacent segment degenerative disease at the
L1-2 level. There is broad-based herniation of disc material with
fragment migrated caudally on the right an upward on the left.
Severe spinal stenosis could cause neural compression at this level.
Pronounced vertebral body edema could be associated with back pain.

## 2016-10-19 DIAGNOSIS — I10 Essential (primary) hypertension: Secondary | ICD-10-CM | POA: Diagnosis not present

## 2016-10-19 DIAGNOSIS — H409 Unspecified glaucoma: Secondary | ICD-10-CM | POA: Diagnosis not present

## 2016-10-19 DIAGNOSIS — N400 Enlarged prostate without lower urinary tract symptoms: Secondary | ICD-10-CM | POA: Diagnosis not present

## 2016-10-19 DIAGNOSIS — J449 Chronic obstructive pulmonary disease, unspecified: Secondary | ICD-10-CM | POA: Diagnosis not present

## 2016-10-24 DIAGNOSIS — J410 Simple chronic bronchitis: Secondary | ICD-10-CM | POA: Diagnosis not present

## 2016-10-24 DIAGNOSIS — J330 Polyp of nasal cavity: Secondary | ICD-10-CM | POA: Diagnosis not present

## 2016-10-24 DIAGNOSIS — J343 Hypertrophy of nasal turbinates: Secondary | ICD-10-CM | POA: Diagnosis not present

## 2016-10-24 DIAGNOSIS — J3081 Allergic rhinitis due to animal (cat) (dog) hair and dander: Secondary | ICD-10-CM | POA: Diagnosis not present

## 2016-10-24 DIAGNOSIS — J301 Allergic rhinitis due to pollen: Secondary | ICD-10-CM | POA: Diagnosis not present

## 2016-11-01 DIAGNOSIS — H401131 Primary open-angle glaucoma, bilateral, mild stage: Secondary | ICD-10-CM | POA: Diagnosis not present

## 2016-11-05 DIAGNOSIS — J301 Allergic rhinitis due to pollen: Secondary | ICD-10-CM | POA: Diagnosis not present

## 2016-11-05 DIAGNOSIS — J3089 Other allergic rhinitis: Secondary | ICD-10-CM | POA: Diagnosis not present

## 2016-11-05 DIAGNOSIS — J302 Other seasonal allergic rhinitis: Secondary | ICD-10-CM | POA: Diagnosis not present

## 2016-11-26 DIAGNOSIS — J320 Chronic maxillary sinusitis: Secondary | ICD-10-CM | POA: Diagnosis not present

## 2016-11-26 DIAGNOSIS — J301 Allergic rhinitis due to pollen: Secondary | ICD-10-CM | POA: Diagnosis not present

## 2016-12-06 DIAGNOSIS — I10 Essential (primary) hypertension: Secondary | ICD-10-CM | POA: Diagnosis not present

## 2016-12-06 DIAGNOSIS — Z6835 Body mass index (BMI) 35.0-35.9, adult: Secondary | ICD-10-CM | POA: Diagnosis not present

## 2016-12-06 DIAGNOSIS — M47817 Spondylosis without myelopathy or radiculopathy, lumbosacral region: Secondary | ICD-10-CM | POA: Diagnosis not present

## 2017-02-15 ENCOUNTER — Other Ambulatory Visit: Payer: Self-pay | Admitting: Geriatric Medicine

## 2017-02-15 DIAGNOSIS — R911 Solitary pulmonary nodule: Secondary | ICD-10-CM

## 2017-02-15 DIAGNOSIS — Z1389 Encounter for screening for other disorder: Secondary | ICD-10-CM | POA: Diagnosis not present

## 2017-02-15 DIAGNOSIS — M5442 Lumbago with sciatica, left side: Secondary | ICD-10-CM | POA: Diagnosis not present

## 2017-02-15 DIAGNOSIS — I77819 Aortic ectasia, unspecified site: Secondary | ICD-10-CM | POA: Diagnosis not present

## 2017-02-15 DIAGNOSIS — Z79899 Other long term (current) drug therapy: Secondary | ICD-10-CM | POA: Diagnosis not present

## 2017-02-15 DIAGNOSIS — K219 Gastro-esophageal reflux disease without esophagitis: Secondary | ICD-10-CM | POA: Diagnosis not present

## 2017-02-15 DIAGNOSIS — E7800 Pure hypercholesterolemia, unspecified: Secondary | ICD-10-CM | POA: Diagnosis not present

## 2017-02-15 DIAGNOSIS — I10 Essential (primary) hypertension: Secondary | ICD-10-CM | POA: Diagnosis not present

## 2017-02-15 DIAGNOSIS — D329 Benign neoplasm of meninges, unspecified: Secondary | ICD-10-CM | POA: Diagnosis not present

## 2017-02-15 DIAGNOSIS — J449 Chronic obstructive pulmonary disease, unspecified: Secondary | ICD-10-CM | POA: Diagnosis not present

## 2017-02-15 DIAGNOSIS — I700 Atherosclerosis of aorta: Secondary | ICD-10-CM | POA: Diagnosis not present

## 2017-02-15 DIAGNOSIS — Z0000 Encounter for general adult medical examination without abnormal findings: Secondary | ICD-10-CM | POA: Diagnosis not present

## 2017-02-18 DIAGNOSIS — H401131 Primary open-angle glaucoma, bilateral, mild stage: Secondary | ICD-10-CM | POA: Diagnosis not present

## 2017-02-18 DIAGNOSIS — H5213 Myopia, bilateral: Secondary | ICD-10-CM | POA: Diagnosis not present

## 2017-02-18 DIAGNOSIS — H52203 Unspecified astigmatism, bilateral: Secondary | ICD-10-CM | POA: Diagnosis not present

## 2017-02-18 DIAGNOSIS — Z961 Presence of intraocular lens: Secondary | ICD-10-CM | POA: Diagnosis not present

## 2017-02-18 DIAGNOSIS — H524 Presbyopia: Secondary | ICD-10-CM | POA: Diagnosis not present

## 2017-03-01 ENCOUNTER — Ambulatory Visit
Admission: RE | Admit: 2017-03-01 | Discharge: 2017-03-01 | Disposition: A | Discharge status: Home / Self Care | Payer: Medicare Other | Source: Ambulatory Visit | Attending: Geriatric Medicine | Admitting: Geriatric Medicine

## 2017-03-01 DIAGNOSIS — R911 Solitary pulmonary nodule: Secondary | ICD-10-CM

## 2017-03-06 DIAGNOSIS — I10 Essential (primary) hypertension: Secondary | ICD-10-CM | POA: Diagnosis not present

## 2017-03-06 DIAGNOSIS — Z6834 Body mass index (BMI) 34.0-34.9, adult: Secondary | ICD-10-CM | POA: Diagnosis not present

## 2017-03-06 DIAGNOSIS — M47817 Spondylosis without myelopathy or radiculopathy, lumbosacral region: Secondary | ICD-10-CM | POA: Diagnosis not present

## 2017-03-18 DIAGNOSIS — I10 Essential (primary) hypertension: Secondary | ICD-10-CM | POA: Diagnosis not present

## 2017-03-18 DIAGNOSIS — J449 Chronic obstructive pulmonary disease, unspecified: Secondary | ICD-10-CM | POA: Diagnosis not present

## 2017-03-20 DIAGNOSIS — Z6834 Body mass index (BMI) 34.0-34.9, adult: Secondary | ICD-10-CM | POA: Diagnosis not present

## 2017-03-20 DIAGNOSIS — I10 Essential (primary) hypertension: Secondary | ICD-10-CM | POA: Diagnosis not present

## 2017-03-20 DIAGNOSIS — M545 Low back pain: Secondary | ICD-10-CM | POA: Diagnosis not present

## 2017-04-10 DIAGNOSIS — I10 Essential (primary) hypertension: Secondary | ICD-10-CM | POA: Diagnosis not present

## 2017-04-10 DIAGNOSIS — M5417 Radiculopathy, lumbosacral region: Secondary | ICD-10-CM | POA: Diagnosis not present

## 2017-04-19 ENCOUNTER — Other Ambulatory Visit: Payer: Self-pay | Admitting: Radiation Therapy

## 2017-04-19 DIAGNOSIS — D329 Benign neoplasm of meninges, unspecified: Secondary | ICD-10-CM

## 2017-05-01 DIAGNOSIS — M5417 Radiculopathy, lumbosacral region: Secondary | ICD-10-CM | POA: Diagnosis not present

## 2017-05-01 DIAGNOSIS — Z6838 Body mass index (BMI) 38.0-38.9, adult: Secondary | ICD-10-CM | POA: Diagnosis not present

## 2017-05-01 DIAGNOSIS — I10 Essential (primary) hypertension: Secondary | ICD-10-CM | POA: Diagnosis not present

## 2017-05-20 DIAGNOSIS — H401131 Primary open-angle glaucoma, bilateral, mild stage: Secondary | ICD-10-CM | POA: Diagnosis not present

## 2017-05-29 DIAGNOSIS — M47817 Spondylosis without myelopathy or radiculopathy, lumbosacral region: Secondary | ICD-10-CM | POA: Diagnosis not present

## 2017-07-11 DIAGNOSIS — I10 Essential (primary) hypertension: Secondary | ICD-10-CM | POA: Diagnosis not present

## 2017-07-11 DIAGNOSIS — M47817 Spondylosis without myelopathy or radiculopathy, lumbosacral region: Secondary | ICD-10-CM | POA: Diagnosis not present

## 2017-07-11 DIAGNOSIS — Z6834 Body mass index (BMI) 34.0-34.9, adult: Secondary | ICD-10-CM | POA: Diagnosis not present

## 2017-07-17 DIAGNOSIS — M5417 Radiculopathy, lumbosacral region: Secondary | ICD-10-CM | POA: Diagnosis not present

## 2017-08-14 ENCOUNTER — Other Ambulatory Visit: Payer: Self-pay | Admitting: Radiation Therapy

## 2017-08-14 ENCOUNTER — Ambulatory Visit
Admission: RE | Admit: 2017-08-14 | Discharge: 2017-08-14 | Disposition: A | Discharge status: Home / Self Care | Payer: Medicare Other | Source: Ambulatory Visit | Attending: Radiation Oncology | Admitting: Radiation Oncology

## 2017-08-14 DIAGNOSIS — D329 Benign neoplasm of meninges, unspecified: Secondary | ICD-10-CM

## 2017-08-14 DIAGNOSIS — D320 Benign neoplasm of cerebral meninges: Secondary | ICD-10-CM | POA: Diagnosis not present

## 2017-08-14 MED ORDER — GADOBENATE DIMEGLUMINE 529 MG/ML IV SOLN
20.0000 mL | Freq: Once | INTRAVENOUS | Status: AC | PRN
  Administered 2017-08-14: 20 mL via INTRAVENOUS

## 2017-08-14 NOTE — Progress Notes (Signed)
Cody Tate 67 y.o. man with right tentorial meningioma compressing the brainstem s/p SRS 02/04/2013 to 15 Gy,review 08-14-17 MRI brain w wo contrast,one year FU.   Headche:Yes top of his head Dizziness:Yes Nausea/vomiting: Ringing in ears:Yes has ringing in both ears some days that comes and goes. Visual changes,Diplopia:Having blurred vision sometimes when he reads a lot.  Had a problem focusing to see a fifty inch T.V. Screen. Has double vision like a ghost image. Fatigue:Reports fatigue.  Cognitive changes:Alert and oriented x 3 short and long term memory loss. C/O numbness to feet and balance problems feels like something is crawling on his legs. Weight: Wt Readings from Last 3 Encounters:  08/20/17 216 lb 3.2 oz (98.1 kg)  08/13/16 214 lb 6.4 oz (97.3 kg)  08/15/15 206 lb 14.4 oz (93.8 kg)  BP (!) 146/82 (BP Location: Left Arm, Patient Position: Sitting)   Pulse 77   Temp 97.9 F (36.6 C) (Oral)   Resp 20   Ht 5' 6.5" (1.689 m)   Wt 216 lb 3.2 oz (98.1 kg)   SpO2 99%   BMI 34.37 kg/m

## 2017-08-16 DIAGNOSIS — Z79899 Other long term (current) drug therapy: Secondary | ICD-10-CM | POA: Diagnosis not present

## 2017-08-16 DIAGNOSIS — G894 Chronic pain syndrome: Secondary | ICD-10-CM | POA: Diagnosis not present

## 2017-08-16 DIAGNOSIS — M5441 Lumbago with sciatica, right side: Secondary | ICD-10-CM | POA: Diagnosis not present

## 2017-08-16 DIAGNOSIS — I10 Essential (primary) hypertension: Secondary | ICD-10-CM | POA: Diagnosis not present

## 2017-08-19 ENCOUNTER — Other Ambulatory Visit: Payer: Medicare Other

## 2017-08-20 ENCOUNTER — Other Ambulatory Visit: Payer: Self-pay

## 2017-08-20 ENCOUNTER — Ambulatory Visit
Admission: RE | Admit: 2017-08-20 | Discharge: 2017-08-20 | Disposition: A | Discharge status: Home / Self Care | Payer: Medicare Other | Source: Ambulatory Visit | Attending: Urology | Admitting: Urology

## 2017-08-20 ENCOUNTER — Encounter: Payer: Self-pay | Admitting: Urology

## 2017-08-20 VITALS — BP 146/82 | HR 77 | Temp 97.9°F | Resp 20 | Ht 66.5 in | Wt 216.2 lb

## 2017-08-20 DIAGNOSIS — Z08 Encounter for follow-up examination after completed treatment for malignant neoplasm: Secondary | ICD-10-CM | POA: Diagnosis not present

## 2017-08-20 DIAGNOSIS — Z86018 Personal history of other benign neoplasm: Secondary | ICD-10-CM | POA: Diagnosis not present

## 2017-08-20 DIAGNOSIS — Z888 Allergy status to other drugs, medicaments and biological substances status: Secondary | ICD-10-CM | POA: Insufficient documentation

## 2017-08-20 DIAGNOSIS — H401131 Primary open-angle glaucoma, bilateral, mild stage: Secondary | ICD-10-CM | POA: Diagnosis not present

## 2017-08-20 DIAGNOSIS — J449 Chronic obstructive pulmonary disease, unspecified: Secondary | ICD-10-CM | POA: Insufficient documentation

## 2017-08-20 DIAGNOSIS — I10 Essential (primary) hypertension: Secondary | ICD-10-CM | POA: Insufficient documentation

## 2017-08-20 DIAGNOSIS — Z79899 Other long term (current) drug therapy: Secondary | ICD-10-CM | POA: Diagnosis not present

## 2017-08-20 DIAGNOSIS — Z87891 Personal history of nicotine dependence: Secondary | ICD-10-CM | POA: Insufficient documentation

## 2017-08-20 DIAGNOSIS — M549 Dorsalgia, unspecified: Secondary | ICD-10-CM | POA: Diagnosis not present

## 2017-08-20 DIAGNOSIS — G549 Nerve root and plexus disorder, unspecified: Secondary | ICD-10-CM | POA: Diagnosis not present

## 2017-08-20 DIAGNOSIS — D329 Benign neoplasm of meninges, unspecified: Secondary | ICD-10-CM | POA: Diagnosis not present

## 2017-08-20 NOTE — Progress Notes (Addendum)
Radiation Oncology         (336) [phone]    Multidisciplinary Brain and Spine Oncology Clinic Follow-Up Visit Note  CC: [name], MD  [name], MD  Diagnosis: 67 y.o.  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  Interval Since Last Radiation: 4 years, 6 months  02/04/13 SRS Treatment: 15 Gy to right tentorial meningioma  Narrative:  The patient returns today for routine follow-up. In summary he is a pleasant 67 y.o. gentleman who was treated over four years ago with SRS to a tentorial meningioma that was causing compression of the brainstem. He has been followed since his treatment without evidence of recurrence or progression. He does have ongoing issues with low back pain as a result of a prior lumbar fusion required for severe DDD and spinal stenosis. He continues to follow with Dr. Brien Few in pain management and with Dr. Trenton Gammon in neurosurgery. He reports that he's been told by Dr. Trenton Gammon that there is a high likelihood that he will need fusion of L5, but that he has been encouraged to try all other approaches prior to surgery, as this type of surgery may not address some of the balance issues that have been created by spinal nerve compression.  He has tried multiple other management approaches with ESIs, nerve ablations and narcotic pain medications, all of which have only provided temporary relief at best.  More recently, he has been in discussion with Dr. Brien Few for consideration of implantable nerve stimulator for pain control.  However, he has been encouraged to discuss this option with [place] today given the fact that it will limit his ability to continue with MRI brain surveillance.  His most recent brain MRI scan from 08/14/17 was presented and reviewed in our multidisciplinary conference with neuroradiology on 08/19/17 and showed stable characteristics of his previously treated right tentorial meningioma.   On review of systems, the patient  reports that he is doing well overall despite his ongoing issues with back pain and balance as a result of previous spinal nerve root damage. He denies any concerns with progressive dizziness, but states that he just feels off balance and has had multiple falls around the house as a result. He has tried PT in the past for strengthening exercises for his back, but not attempted any PT focused on improving balance. He denies any headaches, visual, auditory, or speech changes. He denies any chest pain, shortness of breath, cough, fevers, chills, night sweats, unintended weight changes. He denies any bowel or bladder disturbances, and denies abdominal pain, nausea or vomiting. He denies any new musculoskeletal or joint aches or pains, new skin lesions or concerns. A complete review of systems is obtained and is otherwise negative.   Past Medical History:  Past Medical History:  Diagnosis Date  . Arthritis   . BPH (benign prostatic hyperplasia)   . COPD (chronic obstructive pulmonary disease) ([place])   . Enlarged prostate   . GERD (gastroesophageal reflux disease)   . H/O hiatal hernia   . H/O wheezing    occ inhaler usage  . Heart murmur   . Hypertension   . Meningioma [facility])     Past Surgical History: Past Surgical History:  Procedure Laterality Date  . BACK SURGERY  06,07,08   lam x3 cerv x 1  . CERVICAL FUSION    . CYSTOSCOPY     67 yrs old  . EYE SURGERY     bil  . KNEE  ARTHROSCOPY  10   lft  . KNEE ARTHROSCOPY Left 07/23/2012   Procedure: LEFT MEDIAL MENISCAL DEBRIDEMENT AND CHONDROPLASTY;  Surgeon: [name], MD;  Location: [facility];  Service: Orthopedics;  Laterality: Left;  . LUMBAR FUSION  02/22/2014   DR POOL  . PILONIDAL CYST EXCISION    . SINUS EXPLORATION    . WRIST GANGLION EXCISION     rt  . WRIST SURGERY  09   cyst lft    Social History:  Social History   Socioeconomic History  . Marital status: Married    Spouse name: Not on file  . Number of children: Not  on file  . Years of education: Not on file  . Highest education level: Not on file  Occupational History  . Not on file  Social Needs  . Financial resource strain: Not on file  . Food insecurity:    Worry: Not on file    Inability: Not on file  . Transportation needs:    Medical: Not on file    Non-medical: Not on file  Tobacco Use  . Smoking status: Former Smoker    Packs/day: 2.00    Years: 0.00    Pack years: 0.00    Types: Cigarettes    Last attempt to quit: 05/03/2000    Years since quitting: 17.3  . Smokeless tobacco: Never Used  . Tobacco comment: quit alcohol 73  Substance and Sexual Activity  . Alcohol use: No  . Drug use: No  . Sexual activity: Not Currently  Lifestyle  . Physical activity:    Days per week: Not on file    Minutes per session: Not on file  . Stress: Not on file  Relationships  . Social connections:    Talks on phone: Not on file    Gets together: Not on file    Attends religious service: Not on file    Active member of club or organization: Not on file    Attends meetings of clubs or organizations: Not on file    Relationship status: Not on file  . Intimate partner violence:    Fear of current or ex partner: Not on file    Emotionally abused: Not on file    Physically abused: Not on file    Forced sexual activity: Not on file  Other Topics Concern  . Not on file  Social History Narrative  . Not on file  The patient is married and retired. He lives in [place].  Family History: No known family history of meningiomas.  ALLERGIES:  is allergic to adhesive [tape] and other.  Meds: Current Outpatient Medications  Medication Sig Dispense Refill  . albuterol (PROVENTIL HFA;VENTOLIN HFA) 108 (90 BASE) MCG/ACT inhaler Inhale into the lungs every 6 (six) hours as needed for wheezing or shortness of breath.    . ALPRAZolam (XANAX) 0.25 MG tablet Take 2 tablets (0.5 mg total) by mouth 3 (three) times daily as needed for sleep (20 min before  MRI or radiation appointment). (Patient not taking: Reported on 08/09/2014) 30 tablet 0  . aspirin 325 MG EC tablet Take 325 mg by mouth daily.    . diazepam (VALIUM) 5 MG tablet Take 1-2 tablets (5-10 mg total) by mouth every 6 (six) hours as needed for muscle spasms. 60 tablet 0  . diphenhydrAMINE (SOMINEX) 25 MG tablet Take 25 mg by mouth at bedtime as needed for sleep.     . fluticasone (FLONASE) 50 MCG/ACT nasal spray Place 2  sprays into both nostrils at bedtime.     . gabapentin (NEURONTIN) 300 MG capsule     . guaifenesin (HUMIBID E) 400 MG TABS Take 400 mg by mouth every 6 (six) hours as needed (for congestion). For mucus    . hydrochlorothiazide (HYDRODIURIL) 25 MG tablet Take 25 mg by mouth every morning.     Marland Kitchen ibuprofen (ADVIL,MOTRIN) 200 MG tablet Take 600 mg by mouth every 6 (six) hours as needed for pain. For pain    . latanoprost (XALATAN) 0.005 % ophthalmic solution Place 1 drop into both eyes at bedtime.     . magnesium hydroxide (MILK OF MAGNESIA) 400 MG/5ML suspension Take 15 mLs by mouth at bedtime as needed for constipation. For constipation    . metoprolol succinate (TOPROL-XL) 100 MG 24 hr tablet Take 100 mg by mouth every evening. Take with or immediately following a meal.    . Multiple Vitamins-Minerals (MULTIVITAMIN ADULT PO) Take by mouth.    Marland Kitchen omeprazole (PRILOSEC) 40 MG capsule Take 40 mg by mouth every morning.    Marland Kitchen oxyCODONE-acetaminophen (PERCOCET/ROXICET) 5-325 MG per tablet Take 2 tablets by mouth every 4 (four) hours as needed for moderate pain.     Marland Kitchen oxymetazoline (AFRIN) 0.05 % nasal spray Place 2 sprays into both nostrils 2 (two) times daily as needed for congestion.    . pseudoephedrine (SUDAFED) 30 MG tablet Take 30 mg by mouth every 6 (six) hours as needed. For congestion    . ramipril (ALTACE) 10 MG capsule Take 10 mg by mouth every morning.     . Simethicone (GAS-X EXTRA STRENGTH PO) Take 1 tablet by mouth 4 (four) times daily as needed (for gas). For upset  stomach    . simvastatin (ZOCOR) 20 MG tablet Take 20 mg by mouth every evening.    Marland Kitchen spironolactone (ALDACTONE) 25 MG tablet Take 12.5 mg by mouth every evening.     . Tamsulosin HCl (FLOMAX) 0.4 MG CAPS Take 0.4 mg by mouth 2 (two) times daily.     No current facility-administered medications for this encounter.     Physical Findings: Wt Readings from Last 3 Encounters:  08/13/16 214 lb 6.4 oz (97.3 kg)  08/15/15 206 lb 14.4 oz (93.8 kg)  02/14/15 220 lb 3.2 oz (99.9 kg)   Temp Readings from Last 3 Encounters:  08/13/16 98.5 F (36.9 C) (Oral)  08/15/15 98.4 F (36.9 C) (Oral)  02/23/14 98.2 F (36.8 C) (Oral)   BP Readings from Last 3 Encounters:  08/13/16 133/79  08/15/15 (!) 118/59  02/14/15 127/74   Pulse Readings from Last 3 Encounters:  08/13/16 80  08/15/15 65  02/14/15 68   In general this is a well appearing Caucasian male in no acute distress. He's alert and oriented x4 and appropriate throughout the examination. Cardiopulmonary assessment is negative for acute distress and he exhibits normal effort. His spine is nontender to palpation and his gait is slow and steady with his walker. He appears to be grossly intact neurologically.   Impression/Plan:   1.  Tentorial Meningioma, s/p SRS Treatment. The patient appears to be clinically and radiographically stable since his treatment. We discussed continuing to be followed in our brain and spine conference annually per the recommendations of the conference this morning. We will transfer his long-term follow up and care to Dr. Mickeal Skinner, beginning with his next annual visit following repeat scan in 2020.  Given his disease stability for over 4 years and significant progression of his  DDD and back pain which continue to be debilitating, it is felt that the benefits of having the spinal stimulator placed far outweigh the risks associated with not being able to proceed with serial MRI brain scans.  Instead, we will plan to continue  to monitor his previously treated meningioma with serial CT Head scans annually going forward.  He is in agreement with this plan and will contact us sooner if he has questions or concerns. 2. Back pain and poor balance. The patient will follow up with his pain management specialist and neurosurgeon regarding the option of placing an implantable spinal stimulator for pain control. We will follow this expectantly at subsequent visits.    Nicholos Johns, PA-C

## 2017-08-20 NOTE — Addendum Note (Signed)
Encounter addended by: Malena Edman, RN on: 08/20/2017 1:48 PM  Actions taken: Charge Capture section accepted, Visit Navigator Flowsheet section accepted

## 2017-08-22 NOTE — Progress Notes (Signed)
Brain and Spine Tumor Board Documentation  Cody Tate was presented by Cecil Cobbs, MD at Brain and Spine Tumor Board on 08/22/2017, which included representatives from neuro oncology, radiation oncology, surgical oncology, radiology, pathology, navigation, genetics.  Cody Tate was presented as a current patient with history of the following treatments:  .  Additionally, we reviewed previous medical and familial history, history of present illness, and recent lab results along with all available histopathologic and imaging studies. The tumor board considered available treatment options and made the following recommendations:  Active surveillance  Tumor board is a meeting of clinicians from various specialty areas who evaluate and discuss patients for whom a multidisciplinary approach is being considered. Final determinations in the plan of care are those of the provider(s). The responsibility for follow up of recommendations given during tumor board is that of the provider.   Today's extended care, comprehensive team conference, Cody Tate was not present for the discussion and was not examined.

## 2017-09-09 DIAGNOSIS — M961 Postlaminectomy syndrome, not elsewhere classified: Secondary | ICD-10-CM | POA: Diagnosis not present

## 2017-09-09 DIAGNOSIS — M5417 Radiculopathy, lumbosacral region: Secondary | ICD-10-CM | POA: Diagnosis not present

## 2017-09-09 DIAGNOSIS — Z6835 Body mass index (BMI) 35.0-35.9, adult: Secondary | ICD-10-CM | POA: Diagnosis not present

## 2017-09-09 DIAGNOSIS — Z6834 Body mass index (BMI) 34.0-34.9, adult: Secondary | ICD-10-CM | POA: Diagnosis not present

## 2017-09-09 DIAGNOSIS — M549 Dorsalgia, unspecified: Secondary | ICD-10-CM | POA: Insufficient documentation

## 2017-09-09 DIAGNOSIS — M47817 Spondylosis without myelopathy or radiculopathy, lumbosacral region: Secondary | ICD-10-CM | POA: Diagnosis not present

## 2017-10-10 DIAGNOSIS — M47817 Spondylosis without myelopathy or radiculopathy, lumbosacral region: Secondary | ICD-10-CM | POA: Diagnosis not present

## 2017-10-22 DIAGNOSIS — Z23 Encounter for immunization: Secondary | ICD-10-CM | POA: Diagnosis not present

## 2017-10-29 DIAGNOSIS — G894 Chronic pain syndrome: Secondary | ICD-10-CM | POA: Diagnosis not present

## 2017-11-25 DIAGNOSIS — H401131 Primary open-angle glaucoma, bilateral, mild stage: Secondary | ICD-10-CM | POA: Diagnosis not present

## 2017-12-10 DIAGNOSIS — M961 Postlaminectomy syndrome, not elsewhere classified: Secondary | ICD-10-CM | POA: Diagnosis not present

## 2017-12-17 ENCOUNTER — Other Ambulatory Visit: Payer: Self-pay | Admitting: Neurological Surgery

## 2017-12-17 ENCOUNTER — Ambulatory Visit
Admission: RE | Admit: 2017-12-17 | Discharge: 2017-12-17 | Disposition: A | Discharge status: Home / Self Care | Payer: Medicare Other | Source: Ambulatory Visit | Attending: Neurological Surgery | Admitting: Neurological Surgery

## 2017-12-17 ENCOUNTER — Other Ambulatory Visit: Payer: Medicare Other

## 2017-12-17 DIAGNOSIS — M961 Postlaminectomy syndrome, not elsewhere classified: Secondary | ICD-10-CM

## 2017-12-17 DIAGNOSIS — M48061 Spinal stenosis, lumbar region without neurogenic claudication: Secondary | ICD-10-CM | POA: Diagnosis not present

## 2017-12-17 DIAGNOSIS — M5124 Other intervertebral disc displacement, thoracic region: Secondary | ICD-10-CM | POA: Diagnosis not present

## 2017-12-30 ENCOUNTER — Other Ambulatory Visit: Payer: Self-pay | Admitting: Neurological Surgery

## 2017-12-30 DIAGNOSIS — M961 Postlaminectomy syndrome, not elsewhere classified: Secondary | ICD-10-CM | POA: Diagnosis not present

## 2017-12-31 NOTE — Pre-Procedure Instructions (Signed)
Cody Tate  12/31/2017      [place] ([place]), [place] [facility] [place] [address] [place] [address] Phone: [phone] Fax: [phone]  [place], [facility] [address] [place] [address] [place] [address] Phone: [phone] Fax: [phone]    Your procedure is scheduled on Thurs., Jan. 16, 2020 from 2:53PM-4:58PM  Report to [facility] Admitting Entrance "A" at 12:50PM  Call this number if you have problems the morning of surgery:  [phone]   Remember:  Do not eat or drink after midnight on Jan. 15th    Take these medicines the morning of surgery with A SIP OF WATER: Gabapentin (NEURONTIN), Omeprazole (PRILOSEC), Tamsulosin HCl (FLOMAX), and  Fluticasone (FLONASE)  If needed: Diazepam (VALIUM), Guaifenesin (HUMIBID E), OxyCODONE-acetaminophen (PERCOCET/ROXICET), Oxymetazoline (AFRIN), Simethicone (GAS-X EXTRA STRENGTH), and Albuterol Inhaler-bring with you the day of surgery  Follow your surgeon's instructions on when to stop Aspirin.  If no instructions were given by your surgeon then you will need to call the office to get those instructions.    7 days before surgery (01/09/18), stop taking all Other Aspirin Products, Vitamins, Fish oils, and Herbal medications. Also stop all NSAIDS i.e. Advil, Ibuprofen, Motrin, Aleve, Anaprox, Naproxen, BC, Goody Powders, and all Supplements. Including: Pseudoephedrine (SUDAFED)     Do not wear jewelry.  Do not wear lotions, powders, colognes, or deodorant.  Do not shave 48 hours prior to surgery.  Men may shave face.  Do not bring valuables to the hospital.  [facility] is not responsible for any belongings or valuables.  Contacts, dentures or bridgework may not be worn into surgery.  Leave your suitcase in the car.  After surgery it may be brought to your room.  For patients admitted to the hospital, discharge time will be  determined by your treatment team.  Patients discharged the day of surgery will not be allowed to drive home.   Special instructions:   [place]- Preparing For Surgery  Before surgery, you can play an important role. Because skin is not sterile, your skin needs to be as free of germs as possible. You can reduce the number of germs on your skin by washing with CHG (chlorahexidine gluconate) Soap before surgery.  CHG is an antiseptic cleaner which kills germs and bonds with the skin to continue killing germs even after washing.    Oral Hygiene is also important to reduce your risk of infection.  Remember - BRUSH YOUR TEETH THE MORNING OF SURGERY WITH YOUR REGULAR TOOTHPASTE  Please do not use if you have an allergy to CHG or antibacterial soaps. If your skin becomes reddened/irritated stop using the CHG.  Do not shave (including legs and underarms) for at least 48 hours prior to first CHG shower. It is OK to shave your face.  Please follow these instructions carefully.   1. Shower the NIGHT BEFORE SURGERY and the MORNING OF SURGERY with CHG.   2. If you chose to wash your hair, wash your hair first as usual with your normal shampoo.  3. After you shampoo, rinse your hair and body thoroughly to remove the shampoo.  4. Use CHG as you would any other liquid soap. You can apply CHG directly to the skin and wash gently with a scrungie or a clean washcloth.   5. Apply the CHG Soap to your body ONLY FROM THE NECK DOWN.  Do not use on open wounds or open sores. Avoid contact with your eyes, ears, mouth and genitals (private parts). Wash Face and genitals (private parts)  with your normal soap.  6. Wash thoroughly, paying special attention to the area where your surgery will be performed.  7. Thoroughly rinse your body with warm water from the neck down.  8. DO NOT shower/wash with your normal soap after using and rinsing off the CHG Soap.  9. Pat yourself dry with a CLEAN TOWEL.  10. Wear  CLEAN PAJAMAS to bed the night before surgery, wear comfortable clothes the morning of surgery  11. Place CLEAN SHEETS on your bed the night of your first shower and DO NOT SLEEP WITH PETS.  Day of Surgery:  Do not apply any deodorants/lotions.  Please wear clean clothes to the hospital/surgery center.   Remember to brush your teeth WITH YOUR REGULAR TOOTHPASTE.  Please read over the following fact sheets that you were given. Pain Booklet, Coughing and Deep Breathing, MRSA Information and Surgical Site Infection Prevention

## 2018-01-01 HISTORY — PX: COLONOSCOPY: SHX174

## 2018-01-02 ENCOUNTER — Encounter (HOSPITAL_COMMUNITY)
Admission: RE | Admit: 2018-01-02 | Discharge: 2018-01-02 | Disposition: A | Discharge status: Home / Self Care | Payer: Medicare Other | Source: Ambulatory Visit | Attending: Neurological Surgery | Admitting: Neurological Surgery

## 2018-01-02 ENCOUNTER — Other Ambulatory Visit: Payer: Self-pay

## 2018-01-02 ENCOUNTER — Encounter (HOSPITAL_COMMUNITY): Payer: Self-pay

## 2018-01-02 DIAGNOSIS — Z87891 Personal history of nicotine dependence: Secondary | ICD-10-CM | POA: Diagnosis not present

## 2018-01-02 DIAGNOSIS — Z01818 Encounter for other preprocedural examination: Secondary | ICD-10-CM | POA: Diagnosis not present

## 2018-01-02 DIAGNOSIS — R9431 Abnormal electrocardiogram [ECG] [EKG]: Secondary | ICD-10-CM | POA: Diagnosis not present

## 2018-01-02 DIAGNOSIS — Z79899 Other long term (current) drug therapy: Secondary | ICD-10-CM | POA: Diagnosis not present

## 2018-01-02 DIAGNOSIS — Z7951 Long term (current) use of inhaled steroids: Secondary | ICD-10-CM | POA: Diagnosis not present

## 2018-01-02 DIAGNOSIS — M961 Postlaminectomy syndrome, not elsewhere classified: Secondary | ICD-10-CM | POA: Diagnosis not present

## 2018-01-02 DIAGNOSIS — N400 Enlarged prostate without lower urinary tract symptoms: Secondary | ICD-10-CM | POA: Insufficient documentation

## 2018-01-02 DIAGNOSIS — K219 Gastro-esophageal reflux disease without esophagitis: Secondary | ICD-10-CM | POA: Diagnosis not present

## 2018-01-02 DIAGNOSIS — Z7982 Long term (current) use of aspirin: Secondary | ICD-10-CM | POA: Diagnosis not present

## 2018-01-02 DIAGNOSIS — I10 Essential (primary) hypertension: Secondary | ICD-10-CM | POA: Insufficient documentation

## 2018-01-02 DIAGNOSIS — D320 Benign neoplasm of cerebral meninges: Secondary | ICD-10-CM | POA: Insufficient documentation

## 2018-01-02 DIAGNOSIS — J449 Chronic obstructive pulmonary disease, unspecified: Secondary | ICD-10-CM | POA: Diagnosis not present

## 2018-01-02 LAB — CBC
HCT: 43.7 % (ref 39.0–52.0)
Hemoglobin: 13.5 g/dL (ref 13.0–17.0)
MCH: 25.5 pg — AB (ref 26.0–34.0)
MCHC: 30.9 g/dL (ref 30.0–36.0)
MCV: 82.6 fL (ref 80.0–100.0)
Platelets: 318 10*3/uL (ref 150–400)
RBC: 5.29 MIL/uL (ref 4.22–5.81)
RDW: 13.7 % (ref 11.5–15.5)
WBC: 6.7 10*3/uL (ref 4.0–10.5)
nRBC: 0 % (ref 0.0–0.2)

## 2018-01-02 LAB — SURGICAL PCR SCREEN
MRSA, PCR: NEGATIVE
Staphylococcus aureus: NEGATIVE

## 2018-01-02 LAB — BASIC METABOLIC PANEL
Anion gap: 11 (ref 5–15)
BUN: 19 mg/dL (ref 8–23)
CO2: 25 mmol/L (ref 22–32)
Calcium: 9.1 mg/dL (ref 8.9–10.3)
Chloride: 100 mmol/L (ref 98–111)
Creatinine, Ser: 0.93 mg/dL (ref 0.61–1.24)
GFR calc Af Amer: >60 mL/min (ref >60)
GFR calc non Af Amer: >60 mL/min (ref >60)
GLUCOSE: 108 mg/dL — AB (ref 70–99)
Potassium: 4.4 mmol/L (ref 3.5–5.1)
Sodium: 136 mmol/L (ref 135–145)

## 2018-01-02 NOTE — Progress Notes (Signed)
PCP - Dr. Wende Bushy Cardiologist - patient states he saw one at one time but no longer does.  Chest x-ray - n/a EKG - 01/02/18 Stress Test - not sure when and patient cannot recall name of cardiologist or office; requested records from Dr. Felipa Eth ECHO - not sure when and patient cannot recall name of cardiologist or office; requested records from Dr. Felipa Eth Cardiac Cath - patient states it was in 1998 by Dr. Ilda Foil  Sleep Study - patient denies; positive stop bang, sent to PCP CPAP -   Fasting Blood Sugar - n/a Checks Blood Sugar _____ times a day  Blood Thinner Instructions:n/a Aspirin Instructions: last dose of ASA 01/09/18  Anesthesia review: yes; requested cardiac records  Patient denies shortness of breath, fever, cough and chest pain at PAT appointment   Patient verbalized understanding of instructions that were given to them at the PAT appointment. Patient was also instructed that they will need to review over the PAT instructions again at home before surgery.

## 2018-01-02 NOTE — Progress Notes (Signed)
   01/02/18 1427  OBSTRUCTIVE SLEEP APNEA  Have you ever been diagnosed with sleep apnea through a sleep study? No  Do you snore loudly (loud enough to be heard through closed doors)?  1  Do you often feel tired, fatigued, or sleepy during the daytime (such as falling asleep during driving or talking to someone)? 0  Has anyone observed you stop breathing during your sleep? 1 (wife thinks there are times when he may stop breathing and then he gasps for air.)  Do you have, or are you being treated for high blood pressure? 1  BMI more than 35 kg/m2? 1  Age > 50 (1-yes) 1  Neck circumference greater than:Male 16 inches or larger, Male 17inches or larger? 0  Male Gender (Yes=1) 1  Obstructive Sleep Apnea Score 6  Score 5 or greater  Results sent to PCP

## 2018-01-03 NOTE — Progress Notes (Addendum)
Anesthesia Chart Review:  Case:  [id number] Date/Time:  01/16/18 1438   Procedure:  LUMBAR SPINAL CORD STIMULATOR INSERTION (N/A Back) - LUMBAR SPINAL CORD STIMULATOR INSERTION   Anesthesia type:  General   Pre-op diagnosis:  Failed back surgical syndrome   Location:  [facility] OR ROOM 43 / [place] [facility]   Surgeon:  [name], MD      DISCUSSION: Patient is a 68 year old male scheduled for the above procedure.  History includes former smoker, hypertension, murmur (mild MR 2011 echo), GERD, hiatal hernia, meningioma (stereotactic radiosurgery 02/04/13; stable appearance 08/14/17 MRI), COPD, L1- fusion 02/22/14.  Preoperative EKG shows new T wave abnormality. Unremarkable cardiac testing in the past, but none in nearly ten year. Normal coronaries in 1998. Non-ischemic stress test in 2007. Echo 2011. He denied chest pain and SOB at his RN PAT visit.   Above reviewed with anesthesiologist Lyn Hollingshead, MD. Patient without known CAD, but with new EKG changes. Recommend preoperative medical evaluation unless Dr. Felipa Eth recommends cardiology referral instead. I have notified Jessica at Dr. [name]' office and will attempt to reach patient to update. This note and 2020 and 2013 EKG tracings to be faxed to Dr. Felipa Eth for review. I also left a voice message with his [place]. Chart will be left for follow-up.   ADDENDUM 01/08/18 1:42 PM:  I spoke with [place] at Dr. Carlyle Lipa office on 01/06/18. Dr. Felipa Eth reviewed tracings and recommended cardiology evaluation. I notified Janett Billow at Dr. [name]' office that same day and she will have Lorriane Shire make arrangements.   ADDENDUM 01/13/18 10:50 AM: Patient had a stress and echo done on 01/10/18. Stress test was non-ischemic. Echo showed normal LVEF with mild LVOT gradient and very mild AS. He was seen by cardiologist [name], MD on 01/13/18 for preoperative evaluation. EKG changes thought likely form HTN and LVH/valve disease. Repeat echo in 1 year recommended.  He was cleared for spinal cord stimulator insertion. Avoiding dehydration recommended.   VS: BP (!) 140/55   Pulse 73   Temp 36.4 C   Ht 5\' 5"  (1.651 m)   Wt 97.9 kg   SpO2 99%   BMI 35.91 kg/m    PROVIDERS: [name], MD is PCP. Last visit 08/16/17. 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 Size was stable by MRI, so long-term follow-up is being transferred to Cecil Cobbs, MD beginning at his repeat scan in 2020.    LABS: Labs reviewed: Acceptable for surgery. (all labs ordered are listed, but only abnormal results are displayed)  Labs Reviewed  BASIC METABOLIC PANEL - Abnormal; Notable for the following components:      Result Value   Glucose, Bld 108 (*)    All other components within normal limits  CBC - Abnormal; Notable for the following components:   MCH 25.5 (*)    All other components within normal limits  SURGICAL PCR SCREEN    IMAGES: MR Brain 08/14/17: IMPRESSION: Stable appearance of treated meningioma along the inferior surface of the anterior tentorium on the right, measured at 18 x 8 x 13 mm today. No mass-effect upon the brain or cranial nerves. Mild post treatment enhancement along the surface of the superior cerebellum on the right is stable.  CT Chest 03/01/17: IMPRESSION: 1. The previously seen tiny right upper lobe pulmonary nodule is no longer measurable. No further follow-up is required.  2. No new pulmonary nodule. 3. Coronary, aortic arch, and branch vessel atherosclerotic vascular  disease.Aortic atherosclerosis (ICD10-I70.0).   EKG: 01/02/18: NSR. ST/T wave abnormality, consider inferior ischemia. S/T wave abnormality, consider anterolateral ischemia. T wave abnormality is new when compared to 05/04/11 tracing.   CV: Nuclear stress test 01/10/18:  Nuclear stress EF: 67%.  The  left ventricular ejection fraction is hyperdynamic (>65%).  Baseline EKG showed NSR with inferolateral T wave inversions. No changes with Lexiscan infusions.  The study is normal.  This is a low risk study.   Echo 01/10/18: Study Conclusions - Procedure narrative: Transthoracic echocardiography. Image   quality was adequate. Intravenous contrast (Definity) was   administered. - Left ventricle: The cavity size was normal. Systolic function was   vigorous. The estimated ejection fraction was in the range of 65%   to 70%. There was dynamic obstruction at rest in the outflow   tract, with a peak velocity of 124 cm/sec and a peak gradient of   6 mm Hg. With valsalva te peak velocit increased to 274 cm/s and   te peak gradient increased to 30 mmHg. Wall motion was normal;   there were no regional wall motion abnormalities. Doppler   parameters are consistent with abnormal left ventricular   relaxation (grade 1 diastolic dysfunction). Doppler parameters   are consistent with high ventricular filling pressure. - Aortic valve: There was very mild stenosis. Peak velocity (S):   226.12 cm/s. Mean gradient (S): 11 mm Hg. Valve area (VTI): 3.82   cm^2. Valve area (Vmax): 3.41 cm^2. Valve area (Vmean): 3.35   cm^2. - Mitral valve: Transvalvular velocity was within the normal range.   There was no evidence for stenosis. There was trivial   regurgitation. - Right ventricle: The cavity size was normal. Wall thickness was   normal. Systolic function was normal. - Tricuspid valve: There was mild regurgitation. - Pulmonary arteries: Systolic pressure was within the normal   range. PA peak pressure: 13 mm Hg (S). - Global longitudinal strain -17.5% (normal).  Echo 08/12/09 [facility]): Conclusions: 1.  Mild concentric left ventricular hypertrophy. 2.  Left ventricular ejection fraction estimated by 2D at 65.8%. 3.  Mild mitral valve regurgitation. 4.  Analysis of mitral valve inflow, pulmonary vein  Doppler and tissue Doppler suggests grade 1 diastolic dysfunction without elevated left atrial pressure.  Nuclear stress test 11/20/05 [facility]): Impression: Unremarkable normal pharmacologic stress test.  Post stress ejection fraction 72%.  Global left ventricular systolic function is normal.  No regional wall motion abnormalities.  Cardiac cath 05/08/96 ([place] [facility]; copy provided by Dr. Felipa Eth): Diagnosis: Noncardiac chest pain. Nonspecific T wave abnormality on EKG. Normal coronary arteries. Intact left ventricular systolic function.   Past Medical History:  Diagnosis Date  . Arthritis   . BPH (benign prostatic hyperplasia)   . COPD (chronic obstructive pulmonary disease) ([place])   . Enlarged prostate   . GERD (gastroesophageal reflux disease)   . H/O hiatal hernia   . H/O wheezing    occ inhaler usage  . Heart murmur   . Hypertension   . Meningioma [facility])     Past Surgical History:  Procedure Laterality Date  . BACK SURGERY  06,07,08   lam x3 cerv x 1  . CERVICAL FUSION    . CYSTOSCOPY     68 yrs old  . EYE SURGERY     bil  . KNEE ARTHROSCOPY  10   lft  . KNEE ARTHROSCOPY Left 07/23/2012  Procedure: LEFT MEDIAL MENISCAL DEBRIDEMENT AND CHONDROPLASTY;  Surgeon: [name], MD;  Location: [facility] ORS;  Service: Orthopedics;  Laterality: Left;  . LUMBAR FUSION  02/22/2014   DR POOL  . PILONIDAL CYST EXCISION    . SINUS EXPLORATION    . WRIST GANGLION EXCISION     rt  . WRIST SURGERY  09   cyst lft    MEDICATIONS: . albuterol (PROVENTIL HFA;VENTOLIN HFA) 108 (90 BASE) MCG/ACT inhaler  . aspirin 81 MG tablet  . bacitracin-polymyxin b (POLYSPORIN) ointment  . diazepam (VALIUM) 2 MG tablet  . diphenhydrAMINE (BENADRYL) 25 mg capsule  . fluticasone (FLONASE) 50 MCG/ACT nasal spray  . gabapentin (NEURONTIN) 300 MG capsule  . guaifenesin (HUMIBID E) 400 MG TABS  . hydrochlorothiazide (HYDRODIURIL) 25 MG tablet  . ibuprofen (ADVIL,MOTRIN) 200 MG tablet   . latanoprost (XALATAN) 0.005 % ophthalmic solution  . magnesium hydroxide (MILK OF MAGNESIA) 400 MG/5ML suspension  . metoprolol succinate (TOPROL-XL) 100 MG 24 hr tablet  . Multiple Vitamins-Minerals (MULTIVITAMIN ADULT PO)  . omeprazole (PRILOSEC) 40 MG capsule  . oxyCODONE-acetaminophen (PERCOCET/ROXICET) 5-325 MG per tablet  . oxymetazoline (AFRIN) 0.05 % nasal spray  . Polyvinyl Alcohol-Povidone PF (REFRESH) 1.4-0.6 % SOLN  . pseudoephedrine (SUDAFED) 30 MG tablet  . ramipril (ALTACE) 10 MG capsule  . Simethicone (GAS-X EXTRA STRENGTH) 125 MG CAPS  . simvastatin (ZOCOR) 20 MG tablet  . spironolactone (ALDACTONE) 25 MG tablet  . Tamsulosin HCl (FLOMAX) 0.4 MG CAPS   No current facility-administered medications for this encounter.   Last dose ASA 01/09/18.   Myra Gianotti, PA-C Surgical Short Stay/Anesthesiology [facility] Phone [phone] [facility] Phone [phone] 01/03/2018 4:15 PM

## 2018-01-08 ENCOUNTER — Telehealth: Payer: Self-pay | Admitting: *Deleted

## 2018-01-08 NOTE — Telephone Encounter (Signed)
   [facility] Pre-operative Risk Assessment    Request for surgical clearance:  1. What type of surgery is being performed? PERMANENT SPINAL CORD STIMULATOR IMPLANT    2. When is this surgery scheduled? 01/16/18   3. What type of clearance is required (medical clearance vs. Pharmacy clearance to hold med vs. Both)? MEDICAL  4. Are there any medications that need to be held prior to surgery and how long? ASA    5. Practice name and name of physician performing surgery? [facility]; DR. Sherley Bounds   6. What is your office phone number [phone]    7.   What is your office fax number [phone]   8.   Anesthesia type (None, local, MAC, general) ? GENERAL   Cody Tate 01/08/2018, 3:02 PM  _________________________________________________________________   (provider comments below)

## 2018-01-09 ENCOUNTER — Telehealth (HOSPITAL_COMMUNITY): Payer: Self-pay | Admitting: Radiology

## 2018-01-09 ENCOUNTER — Telehealth: Payer: Self-pay

## 2018-01-09 DIAGNOSIS — R9431 Abnormal electrocardiogram [ECG] [EKG]: Secondary | ICD-10-CM

## 2018-01-09 DIAGNOSIS — Z01818 Encounter for other preprocedural examination: Secondary | ICD-10-CM

## 2018-01-09 NOTE — Progress Notes (Signed)
Cardiology Office Note   Date:  01/13/2018   ID:  Cody Tate, DOB March 27, 1950, MRN [id number]  PCP:  [name], MD  Cardiologist:   [name], MD   No chief complaint on file.     History of Present Illness: Cody Tate is a 68 y.o. male who presents for consultation regarding pre operative clearance Referred by Dr [name] He needs a spinal cord stimulator implant Scheduled for 01/16/18.  Former smoker with HTN and COPD. Seen by Leonia Reeves years ago and had normal cath Preoperative testing concern for abnormal ECG  ECG from 01/02/17 reviewed and showed SR rate 65 biphasic T waves in V3-V6 Changes new compared to 2013  Nuclear study normal EF 67% TTE normal EF 65-70% mild LVOT gradient mild AS mean gradient 11 mmHg    Past Medical History:  Diagnosis Date  . Arthritis   . BPH (benign prostatic hyperplasia)   . COPD (chronic obstructive pulmonary disease) ([place])   . Enlarged prostate   . GERD (gastroesophageal reflux disease)   . H/O hiatal hernia   . H/O wheezing    occ inhaler usage  . Heart murmur   . Hypertension   . Meningioma [facility])     Past Surgical History:  Procedure Laterality Date  . BACK SURGERY  06,07,08   lam x3 cerv x 1  . CERVICAL FUSION    . CYSTOSCOPY     68 yrs old  . EYE SURGERY     bil  . KNEE ARTHROSCOPY  10   lft  . KNEE ARTHROSCOPY Left 07/23/2012   Procedure: LEFT MEDIAL MENISCAL DEBRIDEMENT AND CHONDROPLASTY;  Surgeon: [name], MD;  Location: [facility];  Service: Orthopedics;  Laterality: Left;  . LUMBAR FUSION  02/22/2014   DR POOL  . PILONIDAL CYST EXCISION    . SINUS EXPLORATION    . WRIST GANGLION EXCISION     rt  . WRIST SURGERY  09   cyst lft     Current Outpatient Medications  Medication Sig Dispense Refill  . albuterol (PROVENTIL HFA;VENTOLIN HFA) 108 (90 BASE) MCG/ACT inhaler Inhale 2 puffs into the lungs every 6 (six) hours as needed for wheezing or shortness of breath.     Marland Kitchen aspirin 81 MG tablet Take 81 mg  by mouth daily.     . bacitracin-polymyxin b (POLYSPORIN) ointment Apply 1 application topically 2 (two) times daily.    . diazepam (VALIUM) 2 MG tablet Take 2 mg by mouth 2 (two) times daily as needed for anxiety.    . diphenhydrAMINE (BENADRYL) 25 mg capsule Take 25 mg by mouth at bedtime as needed for sleep.    . fluticasone (FLONASE) 50 MCG/ACT nasal spray Place 2 sprays into both nostrils daily.     Marland Kitchen gabapentin (NEURONTIN) 300 MG capsule Take 300 mg by mouth 3 (three) times daily.     Marland Kitchen guaifenesin (HUMIBID E) 400 MG TABS Take 400 mg by mouth every 4 (four) hours as needed (for congestion or mucus).     . hydrochlorothiazide (HYDRODIURIL) 25 MG tablet Take 25 mg by mouth every morning.     Marland Kitchen ibuprofen (ADVIL,MOTRIN) 200 MG tablet Take 600 mg by mouth every 6 (six) hours as needed for headache or moderate pain.     Marland Kitchen latanoprost (XALATAN) 0.005 % ophthalmic solution Place 1 drop into both eyes at bedtime.     . magnesium hydroxide (MILK OF MAGNESIA) 400 MG/5ML suspension Take 15 mLs by mouth  at bedtime.     . metoprolol succinate (TOPROL-XL) 100 MG 24 hr tablet Take 100 mg by mouth at bedtime. Take with or immediately following a meal.    . Multiple Vitamins-Minerals (MULTIVITAMIN ADULT PO) Take 1 tablet by mouth daily.     Marland Kitchen omeprazole (PRILOSEC) 40 MG capsule Take 40 mg by mouth every morning.    Marland Kitchen oxyCODONE-acetaminophen (PERCOCET/ROXICET) 5-325 MG per tablet Take 2 tablets by mouth every 6 (six) hours as needed for moderate pain.     Marland Kitchen oxymetazoline (AFRIN) 0.05 % nasal spray Place 2 sprays into both nostrils 2 (two) times daily as needed for congestion.    . Polyvinyl Alcohol-Povidone PF (REFRESH) 1.4-0.6 % SOLN Place 2 drops into both eyes 2 (two) times daily as needed (for dry eyes).    . pseudoephedrine (SUDAFED) 30 MG tablet Take 60 mg by mouth 2 (two) times daily as needed for congestion.     . ramipril (ALTACE) 10 MG capsule Take 10 mg by mouth every morning.     . Simethicone  (GAS-X EXTRA STRENGTH) 125 MG CAPS Take 125 mg by mouth 4 (four) times daily as needed (for gas).     . simvastatin (ZOCOR) 20 MG tablet Take 20 mg by mouth at bedtime.     Marland Kitchen spironolactone (ALDACTONE) 25 MG tablet Take 12.5 mg by mouth at bedtime.     . Tamsulosin HCl (FLOMAX) 0.4 MG CAPS Take 0.4 mg by mouth 2 (two) times daily.     No current facility-administered medications for this visit.     Allergies:   Adhesive [tape] and Other    Social History:  The patient  reports that he quit smoking about 17 years ago. His smoking use included cigarettes. He smoked 2.00 packs per day for 0.00 years. He has never used smokeless tobacco. He reports that he does not drink alcohol or use drugs.   Family History:  The patient's family history is not on file.    ROS:  Please see the history of present illness.   Otherwise, review of systems are positive for none.   All other systems are reviewed and negative.    PHYSICAL EXAM: VS:  BP 128/62   Pulse 77   Ht 5' 6.5" (1.689 m)   Wt 214 lb (97.1 kg)   BMI 34.02 kg/m  , BMI Body mass index is 34.02 kg/m. Affect appropriate Healthy:  appears stated age 75: normal Neck supple with no adenopathy JVP normal no bruits no thyromegaly Lungs clear with no wheezing and good diaphragmatic motion Heart:  S1/S2 no murmur, no rub, gallop or click PMI normal Abdomen: benighn, BS positve, no tenderness, no AAA no bruit.  No HSM or HJR Distal pulses intact with no bruits No edema Neuro non-focal Skin warm and dry No muscular weakness    EKG:  See HPI  01/13/18 SR rate 77 RBBB lateral T wave changes    Recent Labs: 01/02/2018: BUN 19; Creatinine, Ser 0.93; Hemoglobin 13.5; Platelets 318; Potassium 4.4; Sodium 136    Lipid Panel No results found for: CHOL, TRIG, HDL, CHOLHDL, VLDL, LDLCALC, LDLDIRECT    Wt Readings from Last 3 Encounters:  01/13/18 214 lb (97.1 kg)  01/10/18 215 lb (97.5 kg)  01/02/18 215 lb 12.8 oz (97.9 kg)       Other studies Reviewed: Additional studies/ records that were reviewed today include: Notes from anesthesia, primary neurosurgery labs and ECG .    ASSESSMENT AND PLAN:  1.  Abnormal ECG:  Likely form HTN and LVH / valve disease  2. COPD:  No active wheezing should have preoperative CXR 3. HTN:  Well controlled.  Continue current medications and low sodium Dash type diet.   4. HLD  Continue statin labs with Dr Felipa Eth  5. AS:  Mild with small LVOT gradient on beta blocker avoid dehydration. F/U echo in a year 6. Preop:  Clear to have nerve stimulator implant with Dr [name]    Current medicines are reviewed at length with the patient today.  The patient does not have concerns regarding medicines.  The following changes have been made:  no change  Labs/ tests ordered today include: TTE, Lexi Myovue   Orders Placed This Encounter  Procedures  . EKG 12-Lead     Disposition:   FU with cardiology PRN if tests normal      Signed, [name], MD  01/13/2018 9:54 AM    [facility], [place], [place]  [address] Phone: [phone]; Fax: [phone]

## 2018-01-09 NOTE — Telephone Encounter (Signed)
Placed orders for testing. Patient aware of instructions. Sent message to scheduling to call patient with appointment.

## 2018-01-09 NOTE — Telephone Encounter (Signed)
Patient given detailed instructions per Myocardial Perfusion Study Information Sheet for the test on 01/10/2018 at 7:15. Patient notified to arrive 15 minutes early and that it is imperative to arrive on time for appointment to keep from having the test rescheduled.  If you need to cancel or reschedule your appointment, please call the office within 24 hours of your appointment. . Patient verbalized understanding.EHK

## 2018-01-09 NOTE — Telephone Encounter (Signed)
Pre-op callback. I do not see where this patient have ever been seen by our practice. Can you call pt and confirm. Please let requesting provider know that he is not our patient.

## 2018-01-09 NOTE — Telephone Encounter (Signed)
-----   Message from Josue Hector, MD sent at 01/09/2018  8:40 AM EST ----- New patient Monday his surgery is schedule for 1/16 Needs echo and lexiscan myovue ASAP to clear for surgery abnormal ECG

## 2018-01-09 NOTE — Telephone Encounter (Signed)
Pt is scheduled to see Dr. Johnsie Cancel on 01/13/18 as a new patient

## 2018-01-10 ENCOUNTER — Other Ambulatory Visit: Payer: Self-pay

## 2018-01-10 ENCOUNTER — Ambulatory Visit (HOSPITAL_COMMUNITY): Payer: Medicare Other | Attending: Cardiovascular Disease

## 2018-01-10 ENCOUNTER — Ambulatory Visit (HOSPITAL_BASED_OUTPATIENT_CLINIC_OR_DEPARTMENT_OTHER): Payer: Medicare Other

## 2018-01-10 ENCOUNTER — Other Ambulatory Visit (HOSPITAL_COMMUNITY): Payer: Self-pay | Admitting: Cardiovascular Disease

## 2018-01-10 DIAGNOSIS — R9431 Abnormal electrocardiogram [ECG] [EKG]: Secondary | ICD-10-CM

## 2018-01-10 DIAGNOSIS — Z01818 Encounter for other preprocedural examination: Secondary | ICD-10-CM | POA: Diagnosis not present

## 2018-01-10 LAB — ECHOCARDIOGRAM COMPLETE
Height: 65 in
Height: 65 in
Weight: 3440 oz
Weight: 3440 oz

## 2018-01-10 LAB — MYOCARDIAL PERFUSION IMAGING
CHL CUP NUCLEAR SSS: 1
LV dias vol: 72 mL (ref 62–150)
LV sys vol: 24 mL
Peak HR: 98 {beats}/min
Rest HR: 64 {beats}/min
SDS: 1
SRS: 0
TID: 1.03

## 2018-01-10 MED ORDER — PERFLUTREN LIPID MICROSPHERE
1.0000 mL | INTRAVENOUS | Status: AC | PRN
  Administered 2018-01-10: 1 mL via INTRAVENOUS

## 2018-01-10 MED ORDER — TECHNETIUM TC 99M TETROFOSMIN IV KIT
10.3000 | PACK | Freq: Once | INTRAVENOUS | Status: AC | PRN
  Administered 2018-01-10: 10.3 via INTRAVENOUS
  Filled 2018-01-10: qty 11

## 2018-01-10 MED ORDER — TECHNETIUM TC 99M TETROFOSMIN IV KIT
32.3000 | PACK | Freq: Once | INTRAVENOUS | Status: AC | PRN
  Administered 2018-01-10: 32.3 via INTRAVENOUS
  Filled 2018-01-10: qty 33

## 2018-01-10 MED ORDER — REGADENOSON 0.4 MG/5ML IV SOLN
0.4000 mg | Freq: Once | INTRAVENOUS | Status: AC
  Administered 2018-01-10: 0.4 mg via INTRAVENOUS

## 2018-01-13 ENCOUNTER — Ambulatory Visit (INDEPENDENT_AMBULATORY_CARE_PROVIDER_SITE_OTHER): Payer: Medicare Other | Admitting: Cardiovascular Disease

## 2018-01-13 VITALS — BP 128/62 | HR 77 | Ht 66.5 in | Wt 214.0 lb

## 2018-01-13 DIAGNOSIS — Z01818 Encounter for other preprocedural examination: Secondary | ICD-10-CM | POA: Diagnosis not present

## 2018-01-13 NOTE — Anesthesia Preprocedure Evaluation (Addendum)
Anesthesia Evaluation  Patient identified by MRN, date of birth, ID band Patient awake    Reviewed: Allergy & Precautions, NPO status , Patient's Chart, lab work & pertinent test results  Airway Mallampati: II  TM Distance: >3 FB     Dental   Pulmonary COPD, former smoker,    breath sounds clear to auscultation       Cardiovascular hypertension,  Rhythm:Regular Rate:Normal  01/10/18 Nuclear study normal EF 67% 01/10/18 TTE normal EF 65-70% mild LVOT gradient mild AS mean gradient 11 mmHg   Cardiac note reviewed. CG   Neuro/Psych    GI/Hepatic Neg liver ROS, hiatal hernia, GERD  ,  Endo/Other  negative endocrine ROS  Renal/GU negative Renal ROS     Musculoskeletal  (+) Arthritis ,   Abdominal   Peds  Hematology   Anesthesia Other Findings   Reproductive/Obstetrics                           Anesthesia Physical Anesthesia Plan  ASA: III  Anesthesia Plan: General   Post-op Pain Management:    Induction: Intravenous  PONV Risk Score and Plan: Ondansetron, Dexamethasone, Midazolam and Treatment may vary due to age or medical condition  Airway Management Planned: Oral ETT  Additional Equipment:   Intra-op Plan:   Post-operative Plan: Possible Post-op intubation/ventilation  Informed Consent: I have reviewed the patients History and Physical, chart, labs and discussed the procedure including the risks, benefits and alternatives for the proposed anesthesia with the patient or authorized representative who has indicated his/her understanding and acceptance.     Dental advisory given  Plan Discussed with: CRNA and Anesthesiologist  Anesthesia Plan Comments: (PAT note written 01/13/2018 by Myra Gianotti, PA-C. )       Anesthesia Quick Evaluation

## 2018-01-13 NOTE — Patient Instructions (Addendum)

## 2018-01-16 ENCOUNTER — Ambulatory Visit (HOSPITAL_COMMUNITY): Payer: Medicare Other | Admitting: Vascular Surgery

## 2018-01-16 ENCOUNTER — Other Ambulatory Visit: Payer: Self-pay

## 2018-01-16 ENCOUNTER — Encounter (HOSPITAL_COMMUNITY)
Admission: RE | Disposition: A | Discharge status: Home / Self Care | Payer: Self-pay | Source: Home / Self Care | Attending: Neurological Surgery

## 2018-01-16 ENCOUNTER — Ambulatory Visit (HOSPITAL_COMMUNITY)
Admission: RE | Admit: 2018-01-16 | Discharge: 2018-01-16 | Disposition: A | Discharge status: Home / Self Care | Payer: Medicare Other | Attending: Neurological Surgery | Admitting: Neurological Surgery

## 2018-01-16 ENCOUNTER — Ambulatory Visit (HOSPITAL_COMMUNITY): Payer: Medicare Other

## 2018-01-16 ENCOUNTER — Encounter (HOSPITAL_COMMUNITY): Payer: Self-pay | Admitting: Surgery

## 2018-01-16 DIAGNOSIS — J449 Chronic obstructive pulmonary disease, unspecified: Secondary | ICD-10-CM | POA: Insufficient documentation

## 2018-01-16 DIAGNOSIS — Z969 Presence of functional implant, unspecified: Secondary | ICD-10-CM | POA: Diagnosis not present

## 2018-01-16 DIAGNOSIS — Y831 Surgical operation with implant of artificial internal device as the cause of abnormal reaction of the patient, or of later complication, without mention of misadventure at the time of the procedure: Secondary | ICD-10-CM | POA: Insufficient documentation

## 2018-01-16 DIAGNOSIS — Z419 Encounter for procedure for purposes other than remedying health state, unspecified: Secondary | ICD-10-CM

## 2018-01-16 DIAGNOSIS — Z79899 Other long term (current) drug therapy: Secondary | ICD-10-CM | POA: Diagnosis not present

## 2018-01-16 DIAGNOSIS — M961 Postlaminectomy syndrome, not elsewhere classified: Secondary | ICD-10-CM | POA: Diagnosis not present

## 2018-01-16 DIAGNOSIS — Z981 Arthrodesis status: Secondary | ICD-10-CM | POA: Insufficient documentation

## 2018-01-16 DIAGNOSIS — I10 Essential (primary) hypertension: Secondary | ICD-10-CM | POA: Insufficient documentation

## 2018-01-16 DIAGNOSIS — K219 Gastro-esophageal reflux disease without esophagitis: Secondary | ICD-10-CM | POA: Diagnosis not present

## 2018-01-16 DIAGNOSIS — Z87891 Personal history of nicotine dependence: Secondary | ICD-10-CM | POA: Insufficient documentation

## 2018-01-16 DIAGNOSIS — Z7982 Long term (current) use of aspirin: Secondary | ICD-10-CM | POA: Diagnosis not present

## 2018-01-16 DIAGNOSIS — N400 Enlarged prostate without lower urinary tract symptoms: Secondary | ICD-10-CM | POA: Insufficient documentation

## 2018-01-16 DIAGNOSIS — Z9689 Presence of other specified functional implants: Secondary | ICD-10-CM

## 2018-01-16 DIAGNOSIS — Z9889 Other specified postprocedural states: Secondary | ICD-10-CM

## 2018-01-16 HISTORY — PX: SPINAL CORD STIMULATOR INSERTION: SHX5378

## 2018-01-16 SURGERY — INSERTION, SPINAL CORD STIMULATOR, LUMBAR
Anesthesia: General | Site: Back

## 2018-01-16 MED ORDER — FENTANYL CITRATE (PF) 100 MCG/2ML IJ SOLN: 25.0000 ug | INTRAMUSCULAR | Status: DC | PRN

## 2018-01-16 MED ORDER — OXYCODONE-ACETAMINOPHEN 5-325 MG PO TABS
ORAL_TABLET | ORAL | Status: AC
  Filled 2018-01-16: qty 1

## 2018-01-16 MED ORDER — PROPOFOL 10 MG/ML IV BOLUS
INTRAVENOUS | Status: DC | PRN
  Administered 2018-01-16: 150 mg via INTRAVENOUS

## 2018-01-16 MED ORDER — EPHEDRINE SULFATE-NACL 50-0.9 MG/10ML-% IV SOSY
PREFILLED_SYRINGE | INTRAVENOUS | Status: DC | PRN
  Administered 2018-01-16: 5 mg via INTRAVENOUS

## 2018-01-16 MED ORDER — ROCURONIUM BROMIDE 10 MG/ML (PF) SYRINGE
PREFILLED_SYRINGE | INTRAVENOUS | Status: DC | PRN
  Administered 2018-01-16: 30 mg via INTRAVENOUS
  Administered 2018-01-16: 20 mg via INTRAVENOUS

## 2018-01-16 MED ORDER — CEFAZOLIN SODIUM-DEXTROSE 2-4 GM/100ML-% IV SOLN
2.0000 g | INTRAVENOUS | Status: AC
  Administered 2018-01-16: 2 g via INTRAVENOUS

## 2018-01-16 MED ORDER — BUPIVACAINE HCL (PF) 0.25 % IJ SOLN
INTRAMUSCULAR | Status: DC | PRN
  Administered 2018-01-16: 10 mL

## 2018-01-16 MED ORDER — MIDAZOLAM HCL 2 MG/2ML IJ SOLN
INTRAMUSCULAR | Status: AC
  Filled 2018-01-16: qty 2

## 2018-01-16 MED ORDER — METHOCARBAMOL 500 MG PO TABS: 500.0000 mg | ORAL_TABLET | Freq: Four times a day (QID) | ORAL | Status: DC | PRN

## 2018-01-16 MED ORDER — METHOCARBAMOL 1000 MG/10ML IJ SOLN: 500.0000 mg | Freq: Four times a day (QID) | INTRAVENOUS | Status: DC | PRN

## 2018-01-16 MED ORDER — LACTATED RINGERS IV SOLN
INTRAVENOUS | Status: DC | PRN
  Administered 2018-01-16: 13:00:00 via INTRAVENOUS

## 2018-01-16 MED ORDER — PROPOFOL 10 MG/ML IV BOLUS
INTRAVENOUS | Status: AC
  Filled 2018-01-16: qty 20

## 2018-01-16 MED ORDER — CHLORHEXIDINE GLUCONATE CLOTH 2 % EX PADS: 6.0000 | MEDICATED_PAD | Freq: Once | CUTANEOUS | Status: DC

## 2018-01-16 MED ORDER — ROCURONIUM BROMIDE 50 MG/5ML IV SOSY
PREFILLED_SYRINGE | INTRAVENOUS | Status: AC
  Filled 2018-01-16: qty 5

## 2018-01-16 MED ORDER — PHENYLEPHRINE 40 MCG/ML (10ML) SYRINGE FOR IV PUSH (FOR BLOOD PRESSURE SUPPORT)
PREFILLED_SYRINGE | INTRAVENOUS | Status: DC | PRN
  Administered 2018-01-16: 80 ug via INTRAVENOUS

## 2018-01-16 MED ORDER — THROMBIN 5000 UNITS EX SOLR
CUTANEOUS | Status: AC
  Filled 2018-01-16: qty 5000

## 2018-01-16 MED ORDER — MIDAZOLAM HCL 5 MG/5ML IJ SOLN
INTRAMUSCULAR | Status: DC | PRN
  Administered 2018-01-16: 2 mg via INTRAVENOUS

## 2018-01-16 MED ORDER — VANCOMYCIN HCL 1 G IV SOLR
INTRAVENOUS | Status: DC | PRN
  Administered 2018-01-16: 1000 mg

## 2018-01-16 MED ORDER — THROMBIN 5000 UNITS EX SOLR
OROMUCOSAL | Status: DC | PRN
  Administered 2018-01-16: 16:00:00 via TOPICAL

## 2018-01-16 MED ORDER — ONDANSETRON HCL 4 MG/2ML IJ SOLN
INTRAMUSCULAR | Status: DC | PRN
  Administered 2018-01-16: 4 mg via INTRAVENOUS

## 2018-01-16 MED ORDER — OXYCODONE-ACETAMINOPHEN 5-325 MG PO TABS
2.0000 | ORAL_TABLET | Freq: Four times a day (QID) | ORAL | Status: DC | PRN
  Administered 2018-01-16: 2 via ORAL

## 2018-01-16 MED ORDER — ONDANSETRON HCL 4 MG/2ML IJ SOLN
INTRAMUSCULAR | Status: AC
  Filled 2018-01-16: qty 2

## 2018-01-16 MED ORDER — SUGAMMADEX SODIUM 200 MG/2ML IV SOLN
INTRAVENOUS | Status: DC | PRN
Start: 2018-01-16 — End: 2018-01-16
  Administered 2018-01-16: 200 mg via INTRAVENOUS

## 2018-01-16 MED ORDER — FENTANYL CITRATE (PF) 100 MCG/2ML IJ SOLN
INTRAMUSCULAR | Status: DC | PRN
  Administered 2018-01-16 (×3): 25 ug via INTRAVENOUS
  Administered 2018-01-16: 75 ug via INTRAVENOUS
  Administered 2018-01-16: 100 ug via INTRAVENOUS

## 2018-01-16 MED ORDER — LIDOCAINE 2% (20 MG/ML) 5 ML SYRINGE
INTRAMUSCULAR | Status: AC
  Filled 2018-01-16: qty 5

## 2018-01-16 MED ORDER — LIDOCAINE 2% (20 MG/ML) 5 ML SYRINGE
INTRAMUSCULAR | Status: DC | PRN
  Administered 2018-01-16: 80 mg via INTRAVENOUS

## 2018-01-16 MED ORDER — VANCOMYCIN HCL 1000 MG IV SOLR
INTRAVENOUS | Status: AC
  Filled 2018-01-16: qty 1000

## 2018-01-16 MED ORDER — SUCCINYLCHOLINE CHLORIDE 20 MG/ML IJ SOLN
INTRAMUSCULAR | Status: DC | PRN
  Administered 2018-01-16: 100 mg via INTRAVENOUS

## 2018-01-16 MED ORDER — SODIUM CHLORIDE 0.9 % IV SOLN
INTRAVENOUS | Status: DC | PRN
  Administered 2018-01-16: 16:00:00

## 2018-01-16 MED ORDER — DEXAMETHASONE SODIUM PHOSPHATE 10 MG/ML IJ SOLN
INTRAMUSCULAR | Status: AC
  Filled 2018-01-16: qty 1

## 2018-01-16 MED ORDER — DEXAMETHASONE SODIUM PHOSPHATE 10 MG/ML IJ SOLN
INTRAMUSCULAR | Status: DC | PRN
  Administered 2018-01-16: 10 mg via INTRAVENOUS

## 2018-01-16 MED ORDER — CEFAZOLIN SODIUM-DEXTROSE 2-4 GM/100ML-% IV SOLN
INTRAVENOUS | Status: AC
  Filled 2018-01-16: qty 100

## 2018-01-16 MED ORDER — BUPIVACAINE HCL (PF) 0.25 % IJ SOLN
INTRAMUSCULAR | Status: AC
  Filled 2018-01-16: qty 30

## 2018-01-16 MED ORDER — 0.9 % SODIUM CHLORIDE (POUR BTL) OPTIME
TOPICAL | Status: DC | PRN
  Administered 2018-01-16: 1000 mL

## 2018-01-16 MED ORDER — FENTANYL CITRATE (PF) 250 MCG/5ML IJ SOLN
INTRAMUSCULAR | Status: AC
  Filled 2018-01-16: qty 5

## 2018-01-16 MED ORDER — EPHEDRINE 5 MG/ML INJ
INTRAVENOUS | Status: AC
  Filled 2018-01-16: qty 10

## 2018-01-16 SURGICAL SUPPLY — 61 items
ADH SKN CLS APL DERMABOND .7 (GAUZE/BANDAGES/DRESSINGS) ×1
APL SKNCLS STERI-STRIP NONHPOA (GAUZE/BANDAGES/DRESSINGS) ×1
BAG DECANTER FOR FLEXI CONT (MISCELLANEOUS) ×2 IMPLANT
BENZOIN TINCTURE PRP APPL 2/3 (GAUZE/BANDAGES/DRESSINGS) ×2 IMPLANT
BUR MATCHSTICK NEURO 3.0 LAGG (BURR) ×2 IMPLANT
CANISTER SUCT 3000ML PPV (MISCELLANEOUS) ×2 IMPLANT
CARTRIDGE OIL MAESTRO DRILL (MISCELLANEOUS) ×1 IMPLANT
CLSR STERI-STRIP ANTIMIC 1/2X4 (GAUZE/BANDAGES/DRESSINGS) ×1 IMPLANT
COVER WAND RF STERILE (DRAPES) ×1 IMPLANT
DERMABOND ADVANCED (GAUZE/BANDAGES/DRESSINGS) ×1
DERMABOND ADVANCED .7 DNX12 (GAUZE/BANDAGES/DRESSINGS) IMPLANT
DIFFUSER DRILL AIR PNEUMATIC (MISCELLANEOUS) ×2 IMPLANT
DRAPE C-ARM 42X72 X-RAY (DRAPES) ×4 IMPLANT
DRAPE LAPAROTOMY 100X72X124 (DRAPES) ×2 IMPLANT
DRAPE SURG 17X23 STRL (DRAPES) ×2 IMPLANT
DRSG OPSITE POSTOP 4X6 (GAUZE/BANDAGES/DRESSINGS) ×1 IMPLANT
DURAPREP 26ML APPLICATOR (WOUND CARE) ×2 IMPLANT
ELECT REM PT RETURN 9FT ADLT (ELECTROSURGICAL) ×2
ELECTRODE REM PT RTRN 9FT ADLT (ELECTROSURGICAL) ×1 IMPLANT
ELEVATER PASSER (SPINAL CORD STIMULATOR) ×2
GAUZE 4X4 16PLY RFD (DISPOSABLE) IMPLANT
GLOVE BIO SURGEON STRL SZ7 (GLOVE) IMPLANT
GLOVE BIO SURGEON STRL SZ8 (GLOVE) ×2 IMPLANT
GLOVE BIOGEL PI IND STRL 7.0 (GLOVE) IMPLANT
GLOVE BIOGEL PI INDICATOR 7.0 (GLOVE)
GOWN STRL REUS W/ TWL LRG LVL3 (GOWN DISPOSABLE) IMPLANT
GOWN STRL REUS W/ TWL XL LVL3 (GOWN DISPOSABLE) ×1 IMPLANT
GOWN STRL REUS W/TWL 2XL LVL3 (GOWN DISPOSABLE) IMPLANT
GOWN STRL REUS W/TWL LRG LVL3 (GOWN DISPOSABLE)
GOWN STRL REUS W/TWL XL LVL3 (GOWN DISPOSABLE) ×2
HEMOSTAT POWDER KIT SURGIFOAM (HEMOSTASIS) ×1 IMPLANT
KIT BASIN OR (CUSTOM PROCEDURE TRAY) ×2 IMPLANT
KIT CHARGING (KITS) ×1
KIT CHARGING PRECISION NEURO (KITS) IMPLANT
KIT REMOTE CONTROL 112802 FREE (KITS) ×1 IMPLANT
KIT TURNOVER KIT B (KITS) ×2 IMPLANT
LEAD NEUROSTIM 50 2X8 (Stem) ×1 IMPLANT
NDL HYPO 25X1 1.5 SAFETY (NEEDLE) ×1 IMPLANT
NDL SPNL 20GX3.5 QUINCKE YW (NEEDLE) IMPLANT
NEEDLE HYPO 25X1 1.5 SAFETY (NEEDLE) ×2 IMPLANT
NEEDLE SPNL 20GX3.5 QUINCKE YW (NEEDLE) IMPLANT
NS IRRIG 1000ML POUR BTL (IV SOLUTION) ×2 IMPLANT
OIL CARTRIDGE MAESTRO DRILL (MISCELLANEOUS) ×2
PACK LAMINECTOMY NEURO (CUSTOM PROCEDURE TRAY) ×2 IMPLANT
PAD ARMBOARD 7.5X6 YLW CONV (MISCELLANEOUS) ×6 IMPLANT
PASSER ELEVATOR (SPINAL CORD STIMULATOR) IMPLANT
PRECISION MONT MRIPULSE 303103 (Spinal Cord Stimulator) ×2 IMPLANT
RUBBERBAND STERILE (MISCELLANEOUS) ×4 IMPLANT
SPONGE SURGIFOAM ABS GEL SZ50 (HEMOSTASIS) ×2 IMPLANT
STAPLER SKIN PROX WIDE 3.9 (STAPLE) ×1 IMPLANT
STIMULATOR SPINAL MRI PULSE (Spinal Cord Stimulator) IMPLANT
STRIP CLOSURE SKIN 1/2X4 (GAUZE/BANDAGES/DRESSINGS) ×2 IMPLANT
SUT SILK 3 0 SH 30 (SUTURE) ×1 IMPLANT
SUT VIC AB 0 CT1 18XCR BRD8 (SUTURE) ×1 IMPLANT
SUT VIC AB 0 CT1 8-18 (SUTURE) ×2
SUT VIC AB 2-0 CP2 18 (SUTURE) ×2 IMPLANT
SUT VIC AB 3-0 SH 8-18 (SUTURE) ×4 IMPLANT
TOOL TUNNELING 35CM (MISCELLANEOUS) ×1 IMPLANT
TOWEL GREEN STERILE (TOWEL DISPOSABLE) ×2 IMPLANT
TOWEL GREEN STERILE FF (TOWEL DISPOSABLE) ×2 IMPLANT
WATER STERILE IRR 1000ML POUR (IV SOLUTION) ×2 IMPLANT

## 2018-01-16 NOTE — Op Note (Signed)
01/16/2018  4:24 PM  PATIENT:  Cody Tate  68 y.o. male  PRE-OPERATIVE DIAGNOSIS: Failed back syndrome  POST-OPERATIVE DIAGNOSIS:  same  PROCEDURE: Placement of permanent spinal cord stimulator paddle lead via T9 laminectomy with placement of pulse generator  SURGEON:  Sherley Bounds, MD  ASSISTANTS: None  ANESTHESIA:   General  EBL: 10 ml  Total I/O In: 1400 [I.V.:1400] Out: 10 [Blood:10]  BLOOD ADMINISTERED: none  DRAINS: None  SPECIMEN:  none  INDICATION FOR PROCEDURE: This patient presented with failed back syndrome. Imaging showed previous fusion L1-L5. The patient tried conservative measures without relief. Pain was debilitating. Recommended spinal cord stimulator after he did very well with a spinal cord stimulator trial with reduction in pain and improvement of function. Patient understood the risks, benefits, and alternatives and potential outcomes and wished to proceed.  PROCEDURE DETAILS: The patient was taken to the operating room and after induction of adequate generalized endotracheal anesthesia, the patient was rolled into the prone position on chest rolls frame and all pressure points were padded.  We marked our incision over T9-10 utilizing AP fluoroscopy.  Marked our incision in the left flank.  The thoracic and lumbar region was cleaned with Betadine scrub and then prepped with DuraPrep and draped in the usual sterile fashion. 5 cc of local anesthesia was injected and then a dorsal midline incision was made and carried down to the Hasek fascia. The fascia was opened and the paraspinous musculature was taken down in a subperiosteal fashion to expose T9-10. Intraoperative fluoroscopy confirmed my level, and then I used a combination of the high-speed drill and the Kerrison punches to perform a laminectomy, medial facetectomy at T9-10. The underlying yellow ligament was opened and removed in a piecemeal fashion to expose the underlying dura.  I passed the white  passer through the epidural space.  I then passed my paddle lead to the midline with the tip at the T7-8 disc space.  This was confirmed with AP fluoroscopy.  I sewed my leads to the fascia the silk suture.  I then made an incision in the left flank and created my pocket with blunt dissection.  I passed a shunt passer from the flank incision to the midline incision and then passed the leads to the flank incision.  These were put into the pulse generator and impedances were checked.  They were good.  Therefore the leads were locked into the pulse generator and the pulse generator was put into the pocket letters up.  I irrigated with saline solution containing bacitracin. Achieved hemostasis with bipolar cautery, lined the dura with Gelfoam, and then closed the fascia with 0 Vicryl. I closed the subcutaneous tissues with 2-0 Vicryl and the subcuticular tissues with 3-0 Vicryl. The skin was then closed with a bond, benzoin and Steri-Strips.  Flank incision was closed with 2-0 Vicryl in the subcutaneous tissues and 3-0 Vicryl in the subcuticular tissues and then Dermabond and staples in the skin.  Powdered vancomycin was placed into this wound before closure and a small amount had been placed into the thoracic incision over the subcutaneous layer.  The drapes were removed, a sterile dressing was applied. The patient was awakened from general anesthesia and transferred to the recovery room in stable condition. At the end of the procedure all sponge, needle and instrument counts were correct.    PLAN OF CARE: Admit for overnight observation  PATIENT DISPOSITION:  PACU - hemodynamically stable.   Delay start of Pharmacological VTE agent (>  24hrs) due to surgical blood loss or risk of bleeding:  yes

## 2018-01-16 NOTE — H&P (Signed)
Subjective: Patient is a 68 y.o. male admitted for spinal cord stimulator for failed back syndrome. Onset of symptoms was several years ago, gradually worsening since that time.  The pain is rated severe, and is located at the across the lower back and radiates to legs. The pain is described as aching and burning and occurs all day. The symptoms have been progressive. Symptoms are exacerbated by exercise. MRI or CT showed he is lumbar fusion L1-L5.  He did well with a spinal cord stimulator trial by another doctor  Past Medical History:  Diagnosis Date  . Arthritis   . BPH (benign prostatic hyperplasia)   . COPD (chronic obstructive pulmonary disease) ([place])   . Enlarged prostate   . GERD (gastroesophageal reflux disease)   . H/O hiatal hernia   . H/O wheezing    occ inhaler usage  . Heart murmur   . Hypertension   . Meningioma [facility])     Past Surgical History:  Procedure Laterality Date  . BACK SURGERY  06,07,08   lam x3 cerv x 1  . CERVICAL FUSION    . CYSTOSCOPY     68 yrs old  . EYE SURGERY     bil  . KNEE ARTHROSCOPY  10   lft  . KNEE ARTHROSCOPY Left 07/23/2012   Procedure: LEFT MEDIAL MENISCAL DEBRIDEMENT AND CHONDROPLASTY;  Surgeon: [name], MD;  Location: [facility];  Service: Orthopedics;  Laterality: Left;  . LUMBAR FUSION  02/22/2014   DR POOL  . PILONIDAL CYST EXCISION    . SINUS EXPLORATION    . WRIST GANGLION EXCISION     rt  . WRIST SURGERY  09   cyst lft    Prior to Admission medications   Medication Sig Start Date End Date Taking? Authorizing Provider  albuterol (PROVENTIL HFA;VENTOLIN HFA) 108 (90 BASE) MCG/ACT inhaler Inhale 2 puffs into the lungs every 6 (six) hours as needed for wheezing or shortness of breath.    Yes [provider]  aspirin 81 MG tablet Take 81 mg by mouth daily.    Yes [provider]  bacitracin-polymyxin b (POLYSPORIN) ointment Apply 1 application topically 2 (two) times daily.   Yes [provider]   diazepam (VALIUM) 2 MG tablet Take 2 mg by mouth 2 (two) times daily as needed for anxiety.   Yes [provider]  diphenhydrAMINE (BENADRYL) 25 mg capsule Take 25 mg by mouth at bedtime as needed for sleep.   Yes [provider]  fluticasone (FLONASE) 50 MCG/ACT nasal spray Place 2 sprays into both nostrils daily.    Yes [provider]  gabapentin (NEURONTIN) 300 MG capsule Take 300 mg by mouth 3 (three) times daily.    Yes [provider]  guaifenesin (HUMIBID E) 400 MG TABS Take 400 mg by mouth every 4 (four) hours as needed (for congestion or mucus).    Yes [provider]  hydrochlorothiazide (HYDRODIURIL) 25 MG tablet Take 25 mg by mouth every morning.    Yes [provider]  ibuprofen (ADVIL,MOTRIN) 200 MG tablet Take 600 mg by mouth every 6 (six) hours as needed for headache or moderate pain.    Yes [provider]  latanoprost (XALATAN) 0.005 % ophthalmic solution Place 1 drop into both eyes at bedtime.  09/10/12  Yes [provider]  magnesium hydroxide (MILK OF MAGNESIA) 400 MG/5ML suspension Take 15 mLs by mouth at bedtime.    Yes [provider]  metoprolol succinate (TOPROL-XL)  100 MG 24 hr tablet Take 100 mg by mouth at bedtime. Take with or immediately following a meal.   Yes [provider]  Multiple Vitamins-Minerals (MULTIVITAMIN ADULT PO) Take 1 tablet by mouth daily.    Yes [provider]  omeprazole (PRILOSEC) 40 MG capsule Take 40 mg by mouth every morning.   Yes [provider]  oxyCODONE-acetaminophen (PERCOCET/ROXICET) 5-325 MG per tablet Take 2 tablets by mouth every 6 (six) hours as needed for moderate pain.    Yes [provider]  oxymetazoline (AFRIN) 0.05 % nasal spray Place 2 sprays into both nostrils 2 (two) times daily as needed for congestion.   Yes [provider]  Polyvinyl Alcohol-Povidone PF (REFRESH) 1.4-0.6 % SOLN Place 2 drops into  both eyes 2 (two) times daily as needed (for dry eyes).   Yes [provider]  pseudoephedrine (SUDAFED) 30 MG tablet Take 60 mg by mouth 2 (two) times daily as needed for congestion.    Yes [provider]  ramipril (ALTACE) 10 MG capsule Take 10 mg by mouth every morning.    Yes [provider]  Simethicone (GAS-X EXTRA STRENGTH) 125 MG CAPS Take 125 mg by mouth 4 (four) times daily as needed (for gas).    Yes [provider]  simvastatin (ZOCOR) 20 MG tablet Take 20 mg by mouth at bedtime.    Yes [provider]  spironolactone (ALDACTONE) 25 MG tablet Take 12.5 mg by mouth at bedtime.    Yes [provider]  Tamsulosin HCl (FLOMAX) 0.4 MG CAPS Take 0.4 mg by mouth 2 (two) times daily.   Yes [provider]   Allergies  Allergen Reactions  . Adhesive [Tape] Itching, Rash and Other (See Comments)    Occlusive tape and bandaids  . Other Itching, Rash and Other (See Comments)    VICRYL Sutures    Social History   Tobacco Use  . Smoking status: Former Smoker    Packs/day: 2.00    Years: 0.00    Pack years: 0.00    Types: Cigarettes    Last attempt to quit: 05/03/2000    Years since quitting: 17.7  . Smokeless tobacco: Never Used  . Tobacco comment: quit alcohol 73  Substance Use Topics  . Alcohol use: No    History reviewed. No pertinent family history.   Review of Systems  Positive ROS: Negative  All other systems have been reviewed and were otherwise negative with the exception of those mentioned in the HPI and as above.  Objective: Vital signs in last 24 hours: Temp:  [97.7 F (36.5 C)] 97.7 F (36.5 C) (01/16 1227) Pulse Rate:  [78] 78 (01/16 1227) BP: (126)/(64) 126/64 (01/16 1227) SpO2:  [96 %] 96 % (01/16 1227) Weight:  [97.5 kg] 97.5 kg (01/16 1227)  General Appearance: Alert, cooperative, no distress, appears stated age Head: Normocephalic, without obvious abnormality, atraumatic Eyes: PERRL,  conjunctiva/corneas clear, EOM's intact    Neck: Supple, symmetrical, trachea midline Back: Symmetric, no curvature, ROM normal, no CVA tenderness Lungs:  respirations unlabored Heart: Regular rate and rhythm Abdomen: Soft, non-tender Extremities: Extremities normal, atraumatic, no cyanosis or edema Pulses: 2+ and symmetric all extremities Skin: Skin color, texture, turgor normal, no rashes or lesions  NEUROLOGIC:   Mental status: Alert and oriented x4,  no aphasia, good attention span, fund of knowledge, and memory Motor Exam - grossly normal Sensory Exam - grossly normal Reflexes: 1+ Coordination - grossly normal Gait - grossly normal  Balance - grossly normal Cranial Nerves: I: smell Not tested  II: visual acuity  OS: nl    OD: nl  II: visual fields Full to confrontation  II: pupils Equal, round, reactive to light  III,VII: ptosis None  III,IV,VI: extraocular muscles  Full ROM  V: mastication Normal  V: facial light touch sensation  Normal  V,VII: corneal reflex  Present  VII: facial muscle function - upper  Normal  VII: facial muscle function - lower Normal  VIII: hearing Not tested  IX: soft palate elevation  Normal  IX,X: gag reflex Present  XI: trapezius strength  5/5  XI: sternocleidomastoid strength 5/5  XI: neck flexion strength  5/5  XII: tongue strength  Normal    Data Review Lab Results  Component Value Date   WBC 6.7 01/02/2018   HGB 13.5 01/02/2018   HCT 43.7 01/02/2018   MCV 82.6 01/02/2018   PLT 318 01/02/2018   Lab Results  Component Value Date   NA 136 01/02/2018   K 4.4 01/02/2018   CL 100 01/02/2018   CO2 25 01/02/2018   BUN 19 01/02/2018   CREATININE 0.93 01/02/2018   GLUCOSE 108 (H) 01/02/2018   No results found for: INR, PROTIME  Assessment/Plan:  Estimated body mass index is 34.18 kg/m as calculated from the following:   Height as of this encounter: 5' 6.5" (1.689 m).   Weight as of this encounter: 97.5 kg. Patient admitted  for spinal cord stimulator via T9 laminectomy. Patient has failed a reasonable attempt at conservative therapy.  I explained the condition and procedure to the patient and answered any questions.  Patient wishes to proceed with procedure as planned. Understands risks/ benefits and typical outcomes of procedure.   Cody Tate 01/16/2018 2:42 PM

## 2018-01-16 NOTE — Anesthesia Postprocedure Evaluation (Signed)
Anesthesia Post Note  Patient: Cody Tate  Procedure(s) Performed: LUMBAR SPINAL CORD STIMULATOR INSERTION (N/A Back)     Patient location during evaluation: PACU Anesthesia Type: General Level of consciousness: awake Pain management: pain level controlled Vital Signs Assessment: post-procedure vital signs reviewed and stable Respiratory status: spontaneous breathing Cardiovascular status: stable Postop Assessment: no apparent nausea or vomiting Anesthetic complications: no    Last Vitals:  Vitals:   01/16/18 1730 01/16/18 1735  BP:  (!) 133/56  Pulse: 78 92  Resp: 13 16  Temp:    SpO2: 98% 97%    Last Pain:  Vitals:   01/16/18 1630  TempSrc:   PainSc: 9                  [name]

## 2018-01-16 NOTE — Discharge Summary (Signed)
Physician Discharge Summary  Patient ID: Cody Tate MRN: [id number] DOB/AGE: September 24, 1950 68 y.o.  Admit date: 01/16/2018 Discharge date: 01/16/2018  Admission Diagnoses: failed back    Discharge Diagnoses: same   Discharged Condition: good  Hospital Course: The patient was admitted on 01/16/2018 and taken to the operating room where the patient underwent SCS. The patient tolerated the procedure well and was taken to the recovery room and then to the floor in stable condition. The hospital course was routine. There were no complications. The wound remained clean dry and intact. Pt had appropriate back soreness. No complaints of leg pain or new N/T/W. The patient remained afebrile with stable vital signs, and tolerated a regular diet. The patient continued to increase activities, and pain was well controlled with oral pain medications.   Consults: None  Significant Diagnostic Studies:  Results for orders placed or performed in visit on 01/10/18  ECHOCARDIOGRAM COMPLETE  Result Value Ref Range   Weight 3,440 oz   Height 65 in  ECHOCARDIOGRAM COMPLETE  Result Value Ref Range   Weight 3,440 oz   Height 65 in    Dg Thoracolumabar Spine  Result Date: 01/16/2018 CLINICAL DATA:  Stimulator placement EXAM: THORACOLUMBAR SPINE - 2 VIEW COMPARISON:  None. FLUOROSCOPY TIME:  Radiation Exposure Index (as provided by the fluoroscopic device): Not available If the device does not provide the exposure index: Fluoroscopy Time:  16 seconds Number of Acquired Images:  1 FINDINGS: Stimulator is noted over the midthoracic spine. No complicating factors are noted. IMPRESSION: Thoracic spine stimulator placement Electronically Signed   By: Inez Catalina M.D.   On: 01/16/2018 16:20   Dg C-arm 1-60 Min  Result Date: 01/16/2018 CLINICAL DATA:  Neurostimulator. EXAM: DG C-ARM 61-120 MIN COMPARISON:  02/22/2014. FINDINGS: Neurostimulator noted with lead tips over the thoracic spine. One view obtained. 0  minutes 16 seconds fluoroscopy time. IMPRESSION: Neurostimulator noted with lead tips over the thoracic spine. Electronically Signed   By: Marcello Moores  Register   On: 01/16/2018 16:19    Antibiotics:  Anti-infectives (From admission, onward)   Start     Dose/Rate Route Frequency Ordered Stop   01/17/18 0600  ceFAZolin (ANCEF) IVPB 2g/100 mL premix     2 g 200 mL/hr over 30 Minutes Intravenous On call to O.R. 01/16/18 1243 01/16/18 1521   01/16/18 1538  vancomycin (VANCOCIN) powder  Status:  Discontinued       As needed 01/16/18 1538 01/16/18 1623   01/16/18 1537  bacitracin 50,000 Units in sodium chloride 0.9 % 500 mL irrigation  Status:  Discontinued       As needed 01/16/18 1537 01/16/18 1623   01/16/18 1247  ceFAZolin (ANCEF) 2-4 GM/100ML-% IVPB    Note to Pharmacy:  Laurita Quint   : cabinet override      01/16/18 1247 01/16/18 1516      Discharge Exam: Blood pressure (!) 142/62, pulse 93, temperature 97.7 F (36.5 C), resp. rate 14, height 5' 6.5" (1.689 m), weight 97.5 kg, SpO2 97 %. Neurologic: Grossly normal Dressing dry  Discharge Medications:   Allergies as of 01/16/2018      Reactions   Adhesive [tape] Itching, Rash, Other (See Comments)   Occlusive tape and bandaids   Other Itching, Rash, Other (See Comments)   VICRYL Sutures      Medication List    TAKE these medications   albuterol 108 (90 Base) MCG/ACT inhaler Commonly known as:  PROVENTIL HFA;VENTOLIN HFA Inhale 2 puffs into the lungs  every 6 (six) hours as needed for wheezing or shortness of breath.   aspirin 81 MG tablet Take 81 mg by mouth daily.   bacitracin-polymyxin b ointment Commonly known as:  POLYSPORIN Apply 1 application topically 2 (two) times daily.   diazepam 2 MG tablet Commonly known as:  VALIUM Take 2 mg by mouth 2 (two) times daily as needed for anxiety.   diphenhydrAMINE 25 mg capsule Commonly known as:  BENADRYL Take 25 mg by mouth at bedtime as needed for sleep.   fluticasone 50  MCG/ACT nasal spray Commonly known as:  FLONASE Place 2 sprays into both nostrils daily.   gabapentin 300 MG capsule Commonly known as:  NEURONTIN Take 300 mg by mouth 3 (three) times daily.   GAS-X EXTRA STRENGTH 125 MG Caps Generic drug:  Simethicone Take 125 mg by mouth 4 (four) times daily as needed (for gas).   guaifenesin 400 MG Tabs tablet Commonly known as:  HUMIBID E Take 400 mg by mouth every 4 (four) hours as needed (for congestion or mucus).   hydrochlorothiazide 25 MG tablet Commonly known as:  HYDRODIURIL Take 25 mg by mouth every morning.   ibuprofen 200 MG tablet Commonly known as:  ADVIL,MOTRIN Take 600 mg by mouth every 6 (six) hours as needed for headache or moderate pain.   latanoprost 0.005 % ophthalmic solution Commonly known as:  XALATAN Place 1 drop into both eyes at bedtime.   magnesium hydroxide 400 MG/5ML suspension Commonly known as:  MILK OF MAGNESIA Take 15 mLs by mouth at bedtime.   metoprolol succinate 100 MG 24 hr tablet Commonly known as:  TOPROL-XL Take 100 mg by mouth at bedtime. Take with or immediately following a meal.   MULTIVITAMIN ADULT PO Take 1 tablet by mouth daily.   omeprazole 40 MG capsule Commonly known as:  PRILOSEC Take 40 mg by mouth every morning.   oxyCODONE-acetaminophen 5-325 MG tablet Commonly known as:  PERCOCET/ROXICET Take 2 tablets by mouth every 6 (six) hours as needed for moderate pain.   oxymetazoline 0.05 % nasal spray Commonly known as:  AFRIN Place 2 sprays into both nostrils 2 (two) times daily as needed for congestion.   pseudoephedrine 30 MG tablet Commonly known as:  SUDAFED Take 60 mg by mouth 2 (two) times daily as needed for congestion.   ramipril 10 MG capsule Commonly known as:  ALTACE Take 10 mg by mouth every morning.   REFRESH 1.4-0.6 % Soln Generic drug:  Polyvinyl Alcohol-Povidone PF Place 2 drops into both eyes 2 (two) times daily as needed (for dry eyes).   simvastatin 20  MG tablet Commonly known as:  ZOCOR Take 20 mg by mouth at bedtime.   spironolactone 25 MG tablet Commonly known as:  ALDACTONE Take 12.5 mg by mouth at bedtime.   tamsulosin 0.4 MG Caps capsule Commonly known as:  FLOMAX Take 0.4 mg by mouth 2 (two) times daily.       Disposition: home   Final Dx: SCS  Discharge Instructions     Remove dressing in 72 hours   Complete by:  As directed    Call MD for:  difficulty breathing, headache or visual disturbances   Complete by:  As directed    Call MD for:  persistant nausea and vomiting   Complete by:  As directed    Call MD for:  redness, tenderness, or signs of infection (pain, swelling, redness, odor or green/yellow discharge around incision site)   Complete by:  As directed    Call MD for:  severe uncontrolled pain   Complete by:  As directed    Call MD for:  temperature >100.4   Complete by:  As directed    Diet - low sodium heart healthy   Complete by:  As directed    Increase activity slowly   Complete by:  As directed       Follow-up Information    [name], MD. Schedule an appointment as soon as possible for a visit in 2 week(s).   Specialty:  Neurosurgery Contact information: [address] [place] [address] [place] [address] [phone]            Signed: [name] 01/16/2018, 5:51 PM

## 2018-01-16 NOTE — Anesthesia Procedure Notes (Signed)
Performed by: Giannah Zavadil, CRNA       

## 2018-01-16 NOTE — Anesthesia Procedure Notes (Addendum)
Procedure Name: Intubation Date/Time: 01/16/2018 3:08 PM Performed by: Cleda Daub, CRNA Pre-anesthesia Checklist: Patient identified, Emergency Drugs available, Suction available and Patient being monitored Patient Re-evaluated:Patient Re-evaluated prior to induction Oxygen Delivery Method: Circle system utilized Preoxygenation: Pre-oxygenation with 100% oxygen Induction Type: IV induction Ventilation: Mask ventilation without difficulty and Mask ventilation throughout procedure Laryngoscope Size: Mac and 3 Grade View: Grade I Tube type: Oral Tube size: 7.5 mm Number of attempts: 1 Airway Equipment and Method: Stylet Placement Confirmation: ETT inserted through vocal cords under direct vision,  positive ETCO2 and breath sounds checked- equal and bilateral Secured at: 23 cm Tube secured with: Tape Dental Injury: Teeth and Oropharynx as per pre-operative assessment

## 2018-01-16 NOTE — Transfer of Care (Signed)
Immediate Anesthesia Transfer of Care Note  Patient: Cody Tate  Procedure(s) Performed: LUMBAR SPINAL CORD STIMULATOR INSERTION (N/A Back)  Patient Location: PACU  Anesthesia Type:General  Level of Consciousness: awake, alert , oriented and patient cooperative  Airway & Oxygen Therapy: Patient Spontanous Breathing and Patient connected to face mask oxygen  Post-op Assessment: Report given to RN and Post -op Vital signs reviewed and stable  Post vital signs: Reviewed and stable  Last Vitals:  Vitals Value Taken Time  BP 141/68 01/16/2018  4:30 PM  Temp    Pulse 100 01/16/2018  4:34 PM  Resp 20 01/16/2018  4:34 PM  SpO2 100 % 01/16/2018  4:34 PM  Vitals shown include unvalidated device data.  Last Pain:  Vitals:   01/16/18 1303  TempSrc:   PainSc: 5       Patients Stated Pain Goal: 3 (10/31/11 1438)  Complications: No apparent anesthesia complications

## 2018-01-20 ENCOUNTER — Encounter (HOSPITAL_COMMUNITY): Payer: Self-pay | Admitting: Neurological Surgery

## 2018-02-18 DIAGNOSIS — I700 Atherosclerosis of aorta: Secondary | ICD-10-CM | POA: Diagnosis not present

## 2018-02-18 DIAGNOSIS — Z0000 Encounter for general adult medical examination without abnormal findings: Secondary | ICD-10-CM | POA: Diagnosis not present

## 2018-02-18 DIAGNOSIS — E7800 Pure hypercholesterolemia, unspecified: Secondary | ICD-10-CM | POA: Diagnosis not present

## 2018-02-18 DIAGNOSIS — Z79899 Other long term (current) drug therapy: Secondary | ICD-10-CM | POA: Diagnosis not present

## 2018-02-18 DIAGNOSIS — Z125 Encounter for screening for malignant neoplasm of prostate: Secondary | ICD-10-CM | POA: Diagnosis not present

## 2018-02-18 DIAGNOSIS — I10 Essential (primary) hypertension: Secondary | ICD-10-CM | POA: Diagnosis not present

## 2018-02-18 DIAGNOSIS — J449 Chronic obstructive pulmonary disease, unspecified: Secondary | ICD-10-CM | POA: Diagnosis not present

## 2018-02-18 DIAGNOSIS — D329 Benign neoplasm of meninges, unspecified: Secondary | ICD-10-CM | POA: Diagnosis not present

## 2018-02-18 DIAGNOSIS — Z1211 Encounter for screening for malignant neoplasm of colon: Secondary | ICD-10-CM | POA: Diagnosis not present

## 2018-02-18 DIAGNOSIS — M5441 Lumbago with sciatica, right side: Secondary | ICD-10-CM | POA: Diagnosis not present

## 2018-02-18 DIAGNOSIS — Z1389 Encounter for screening for other disorder: Secondary | ICD-10-CM | POA: Diagnosis not present

## 2018-02-19 DIAGNOSIS — I10 Essential (primary) hypertension: Secondary | ICD-10-CM | POA: Diagnosis not present

## 2018-02-25 DIAGNOSIS — H401131 Primary open-angle glaucoma, bilateral, mild stage: Secondary | ICD-10-CM | POA: Diagnosis not present

## 2018-02-25 DIAGNOSIS — Z961 Presence of intraocular lens: Secondary | ICD-10-CM | POA: Diagnosis not present

## 2018-03-03 DIAGNOSIS — J069 Acute upper respiratory infection, unspecified: Secondary | ICD-10-CM | POA: Diagnosis not present

## 2018-05-15 ENCOUNTER — Other Ambulatory Visit: Payer: Self-pay | Admitting: Radiation Therapy

## 2018-05-15 DIAGNOSIS — D329 Benign neoplasm of meninges, unspecified: Secondary | ICD-10-CM

## 2018-05-20 ENCOUNTER — Telehealth: Payer: Self-pay | Admitting: Radiation Therapy

## 2018-05-20 NOTE — Telephone Encounter (Signed)
Left a detailed telephone message on 5/19 about his upcoming head CT on 8/21 and visit with Dr. Mickeal Skinner on 8/26.   Mont Dutton R.T.(R)(T) Special Procedures Navigator

## 2018-07-01 DIAGNOSIS — H401131 Primary open-angle glaucoma, bilateral, mild stage: Secondary | ICD-10-CM | POA: Diagnosis not present

## 2018-08-18 DIAGNOSIS — I10 Essential (primary) hypertension: Secondary | ICD-10-CM | POA: Diagnosis not present

## 2018-08-18 DIAGNOSIS — M5442 Lumbago with sciatica, left side: Secondary | ICD-10-CM | POA: Diagnosis not present

## 2018-08-18 DIAGNOSIS — Z23 Encounter for immunization: Secondary | ICD-10-CM | POA: Diagnosis not present

## 2018-08-18 DIAGNOSIS — G8929 Other chronic pain: Secondary | ICD-10-CM | POA: Diagnosis not present

## 2018-08-18 DIAGNOSIS — J441 Chronic obstructive pulmonary disease with (acute) exacerbation: Secondary | ICD-10-CM | POA: Diagnosis not present

## 2018-08-19 ENCOUNTER — Other Ambulatory Visit: Payer: Self-pay | Admitting: Radiation Therapy

## 2018-08-21 ENCOUNTER — Encounter: Payer: Self-pay | Admitting: *Deleted

## 2018-08-22 ENCOUNTER — Ambulatory Visit: Payer: Medicare Other

## 2018-08-22 ENCOUNTER — Other Ambulatory Visit: Payer: Self-pay

## 2018-08-22 ENCOUNTER — Ambulatory Visit (HOSPITAL_COMMUNITY)
Admission: RE | Admit: 2018-08-22 | Discharge: 2018-08-22 | Disposition: A | Discharge status: Home / Self Care | Payer: Medicare Other | Source: Ambulatory Visit | Attending: Radiation Oncology | Admitting: Radiation Oncology

## 2018-08-22 DIAGNOSIS — D329 Benign neoplasm of meninges, unspecified: Secondary | ICD-10-CM | POA: Insufficient documentation

## 2018-08-22 DIAGNOSIS — H353221 Exudative age-related macular degeneration, left eye, with active choroidal neovascularization: Secondary | ICD-10-CM | POA: Diagnosis not present

## 2018-08-22 DIAGNOSIS — H353111 Nonexudative age-related macular degeneration, right eye, early dry stage: Secondary | ICD-10-CM | POA: Diagnosis not present

## 2018-08-22 MED ORDER — IOHEXOL 300 MG/ML  SOLN
75.0000 mL | Freq: Once | INTRAMUSCULAR | Status: AC | PRN
  Administered 2018-08-22: 75 mL via INTRAVENOUS

## 2018-08-22 MED ORDER — SODIUM CHLORIDE (PF) 0.9 % IJ SOLN
INTRAMUSCULAR | Status: AC
  Filled 2018-08-22: qty 50

## 2018-08-27 ENCOUNTER — Ambulatory Visit: Payer: Medicare Other | Admitting: Internal Medicine

## 2018-08-28 ENCOUNTER — Other Ambulatory Visit: Payer: Self-pay

## 2018-08-28 ENCOUNTER — Inpatient Hospital Stay: Payer: Medicare Other | Attending: Internal Medicine | Admitting: Internal Medicine

## 2018-08-28 VITALS — BP 142/60 | HR 90 | Temp 98.7°F | Resp 18 | Ht 66.5 in | Wt 222.8 lb

## 2018-08-28 DIAGNOSIS — G8929 Other chronic pain: Secondary | ICD-10-CM | POA: Diagnosis not present

## 2018-08-28 DIAGNOSIS — D329 Benign neoplasm of meninges, unspecified: Secondary | ICD-10-CM | POA: Diagnosis not present

## 2018-08-28 NOTE — Progress Notes (Signed)
[facility] [place], [place] [address] [phone]   New Patient Evaluation  Date of Service: 08/28/18 [name] Patient MRN: [id number] Patient DOB: Jan 17, 1950 Provider: Ventura Sellers, MD  Identifying Statement:  Cody Tate is a 68 y.o. male with right tentorial meningioma who presents for initial consultation and evaluation.    Referring Provider: [name], MD [address] [place] [address] [place],  [place] [address]  Oncologic History: 02/04/13: SRS to R tentorial meningioma  History of Present Illness: The patient's records from the referring physician were obtained and reviewed and the patient interviewed to confirm this HPI.  Cody Tate presents today for meningioma follow up after recent CT head.  He describes increased "blurry" vision of his left eye.  Ophthalmologist feels this may be due to early macular degeneration.  No visual "field" issue.  Walking is still limited by chronic back pain; had spinal cord stimulator placed in January which has helped only modestly. Continues to follow with Dr. Annette Stable for chronic back pain.  Medications: Current Outpatient Medications on File Prior to Visit  Medication Sig Dispense Refill  . albuterol (PROVENTIL HFA;VENTOLIN HFA) 108 (90 BASE) MCG/ACT inhaler Inhale 2 puffs into the lungs every 6 (six) hours as needed for wheezing or shortness of breath.     Marland Kitchen aspirin 81 MG tablet Take 81 mg by mouth daily.     . bacitracin-polymyxin b (POLYSPORIN) ointment Apply 1 application topically 2 (two) times daily.    . diazepam (VALIUM) 2 MG tablet Take 2 mg by mouth 2 (two) times daily as needed for anxiety.    . diphenhydrAMINE (BENADRYL) 25 mg capsule Take 25 mg by mouth at bedtime as needed for sleep.    . fluticasone (FLONASE) 50 MCG/ACT nasal spray Place 2 sprays into both nostrils daily.     Marland Kitchen gabapentin (NEURONTIN) 300 MG capsule Take 300 mg by mouth 3  (three) times daily.     Marland Kitchen guaifenesin (HUMIBID E) 400 MG TABS Take 400 mg by mouth every 4 (four) hours as needed (for congestion or mucus).     . hydrochlorothiazide (HYDRODIURIL) 25 MG tablet Take 25 mg by mouth every morning.     Marland Kitchen ibuprofen (ADVIL,MOTRIN) 200 MG tablet Take 600 mg by mouth every 6 (six) hours as needed for headache or moderate pain.     Marland Kitchen latanoprost (XALATAN) 0.005 % ophthalmic solution Place 1 drop into both eyes at bedtime.     . magnesium hydroxide (MILK OF MAGNESIA) 400 MG/5ML suspension Take 15 mLs by mouth at bedtime.     . metoprolol succinate (TOPROL-XL) 100 MG 24 hr tablet Take 100 mg by mouth at bedtime. Take with or immediately following a meal.    . Multiple Vitamins-Minerals (MULTIVITAMIN ADULT PO) Take 1 tablet by mouth daily.     Marland Kitchen omeprazole (PRILOSEC) 40 MG capsule Take 40 mg by mouth every morning.    Marland Kitchen oxyCODONE-acetaminophen (PERCOCET/ROXICET) 5-325 MG per tablet Take 2 tablets by mouth every 6 (six) hours as needed for moderate pain.     Marland Kitchen oxymetazoline (AFRIN) 0.05 % nasal spray Place 2 sprays into both nostrils 2 (two) times daily as needed for congestion.    . Polyvinyl Alcohol-Povidone PF (REFRESH) 1.4-0.6 % SOLN Place 2 drops into both eyes 2 (two) times daily as needed (for dry eyes).    . pseudoephedrine (SUDAFED) 30 MG tablet Take 60 mg by mouth  2 (two) times daily as needed for congestion.     . ramipril (ALTACE) 10 MG capsule Take 10 mg by mouth every morning.     . Simethicone (GAS-X EXTRA STRENGTH) 125 MG CAPS Take 125 mg by mouth 4 (four) times daily as needed (for gas).     . simvastatin (ZOCOR) 20 MG tablet Take 20 mg by mouth at bedtime.     Marland Kitchen spironolactone (ALDACTONE) 25 MG tablet Take 12.5 mg by mouth at bedtime.     . Tamsulosin HCl (FLOMAX) 0.4 MG CAPS Take 0.4 mg by mouth 2 (two) times daily.     No current facility-administered medications on file prior to visit.     Allergies:  Allergies  Allergen Reactions  . Adhesive  [Tape] Itching, Rash and Other (See Comments)    Occlusive tape and bandaids  . Other Itching, Rash and Other (See Comments)    VICRYL Sutures   Past Medical History:  Past Medical History:  Diagnosis Date  . Arthritis   . BPH (benign prostatic hyperplasia)   . COPD (chronic obstructive pulmonary disease) ([facility])   . Enlarged prostate   . GERD (gastroesophageal reflux disease)   . H/O hiatal hernia   . H/O wheezing    occ inhaler usage  . Heart murmur   . Hypertension   . Meningioma [facility])    Past Surgical History:  Past Surgical History:  Procedure Laterality Date  . BACK SURGERY  06,07,08   lam x3 cerv x 1  . CERVICAL FUSION    . CYSTOSCOPY     68 yrs old  . EYE SURGERY     bil  . KNEE ARTHROSCOPY  10   lft  . KNEE ARTHROSCOPY Left 07/23/2012   Procedure: LEFT MEDIAL MENISCAL DEBRIDEMENT AND CHONDROPLASTY;  Surgeon: [name], MD;  Location: [facility];  Service: Orthopedics;  Laterality: Left;  . LUMBAR FUSION  02/22/2014   DR POOL  . PILONIDAL CYST EXCISION    . SINUS EXPLORATION    . SPINAL CORD STIMULATOR INSERTION N/A 01/16/2018   Procedure: LUMBAR SPINAL CORD STIMULATOR INSERTION;  Surgeon: [name], MD;  Location: [facility];  Service: Neurosurgery;  Laterality: N/A;  LUMBAR SPINAL CORD STIMULATOR INSERTION  . WRIST GANGLION EXCISION     rt  . WRIST SURGERY  09   cyst lft   Social History:  Social History   Socioeconomic History  . Marital status: Married    Spouse name: Not on file  . Number of children: Not on file  . Years of education: Not on file  . Highest education level: Not on file  Occupational History  . Not on file  Social Needs  . Financial resource strain: Not on file  . Food insecurity    Worry: Not on file    Inability: Not on file  . Transportation needs    Medical: Not on file    Non-medical: Not on file  Tobacco Use  . Smoking status: Former Smoker    Packs/day: 2.00    Years: 0.00    Pack years: 0.00    Types: Cigarettes     Quit date: 05/03/2000    Years since quitting: 18.3  . Smokeless tobacco: Never Used  . Tobacco comment: quit alcohol 73  Substance and Sexual Activity  . Alcohol use: No  . Drug use: No  . Sexual activity: Not Currently  Lifestyle  . Physical activity    Days per week: Not on  file    Minutes per session: Not on file  . Stress: Not on file  Relationships  . Social Herbalist on phone: Not on file    Gets together: Not on file    Attends religious service: Not on file    Active member of club or organization: Not on file    Attends meetings of clubs or organizations: Not on file    Relationship status: Not on file  . Intimate partner violence    Fear of current or ex partner: Not on file    Emotionally abused: Not on file    Physically abused: Not on file    Forced sexual activity: Not on file  Other Topics Concern  . Not on file  Social History Narrative   08-20-17 Unable to ask abuse questions wife with him today.   Family History: No family history on file.  Review of Systems: Constitutional: Denies fevers, chills or abnormal weight loss Eyes: Denies blurriness of vision Ears, nose, mouth, throat, and face: Denies mucositis or sore throat Respiratory: Denies cough, dyspnea or wheezes Cardiovascular: Denies palpitation, chest discomfort or lower extremity swelling Gastrointestinal:  Denies nausea, constipation, diarrhea GU: Denies dysuria or incontinence Skin: Denies abnormal skin rashes Neurological: Per HPI Musculoskeletal: Denies joint pain, back or neck discomfort. No decrease in ROM Behavioral/Psych: Denies anxiety, disturbance in thought content, and mood instability  Physical Exam: Vitals:   08/28/18 1005  BP: (!) 142/60  Pulse: 90  Resp: 18  Temp: 98.7 F (37.1 C)  SpO2: 100%   KPS: 70. General: Alert, cooperative, pleasant, in no acute distress Head: Craniotomy scar noted, dry and intact. EENT: No conjunctival injection or scleral  icterus. Oral mucosa moist Lungs: Resp effort normal Cardiac: Regular rate and rhythm Abdomen: Soft, non-distended abdomen Skin: No rashes cyanosis or petechiae. Extremities: No clubbing or edema  Neurologic Exam: Mental Status: Awake, alert, attentive to examiner. Oriented to self and environment. Language is fluent with intact comprehension.  Cranial Nerves: Visual acuity is grossly normal. Visual fields are full. Extra-ocular movements intact. No ptosis. Face is symmetric, tongue midline. Motor: Tone and bulk are normal. Power is full in both arms and legs. Reflexes are symmetric, no pathologic reflexes present. Intact finger to nose bilaterally Sensory: Intact to light touch and temperature Gait: Limited by pain  Labs: I have reviewed the data as listed    Component Value Date/Time   NA 136 01/02/2018 1450   K 4.4 01/02/2018 1450   CL 100 01/02/2018 1450   CO2 25 01/02/2018 1450   GLUCOSE 108 (H) 01/02/2018 1450   BUN 19 01/02/2018 1450   BUN 22.8 08/05/2014 1203   CREATININE 0.93 01/02/2018 1450   CREATININE 1.0 08/05/2014 1203   CALCIUM 9.1 01/02/2018 1450   GFRNONAA >60 01/02/2018 1450   GFRAA >60 01/02/2018 1450   Lab Results  Component Value Date   WBC 6.7 01/02/2018   NEUTROABS 4.4 02/12/2014   HGB 13.5 01/02/2018   HCT 43.7 01/02/2018   MCV 82.6 01/02/2018   PLT 318 01/02/2018    Imaging: [place] Clinician Interpretation: I have personally reviewed the CNS images as listed.  My interpretation, in the context of the patient's clinical presentation, is stable disease  Ct Head W Wo Contrast  Result Date: 08/22/2018 CLINICAL DATA:  Follow-up meningioma EXAM: CT HEAD WITHOUT AND WITH CONTRAST TECHNIQUE: Contiguous axial images were obtained from the base of the skull through the vertex without and with intravenous contrast CONTRAST:  86mL OMNIPAQUE IOHEXOL 300 MG/ML  SOLN COMPARISON:  August 14, 2017 FINDINGS: Brain: Partially calcified meningioma in the medial aspect  of the anterior right tentorium measuring 13 x 12 x 8 mm. No interval growth. There is still brainstem contact without mass effect. No new mass. No infarct, hemorrhage, hydrocephalus, or collection. Stable cerebral volume loss and mild periventricular low-density. Vascular: Atherosclerotic calcification. Major vessels are enhancing. Skull: Negative Sinuses/Orbits: Negative IMPRESSION: No growth of the partially calcified meningioma along the right anterior tentorium that contacts but does not deform the brainstem. Electronically Signed   By: Monte Fantasia M.D.   On: 08/22/2018 20:11    Pathology:  Assessment/Plan Meningioma [facility]) [D32.9]  We appreciate the opportunity to participate in the care of Cody Tate.   From meningioma standpoint he is clinically and radiographically stable.  Visual issues are not related to visual pathway dysfunction and likely ophthalmologic in nature.  He will continue to follow with [facility] for management of chronic back pain and spondylosis s/p SCS placement.  He can return to clinic in 1 year with a CT head for evaluation.  All questions were answered. The patient knows to call the clinic with any problems, questions or concerns. No barriers to learning were detected.  The total time spent in the encounter was 45 minutes and more than 50% was on counseling and review of test results   Ventura Sellers, MD Medical Director of Neuro-Oncology [facility] [place] 08/28/18 10:01 AM

## 2018-08-29 ENCOUNTER — Telehealth: Payer: Self-pay | Admitting: Internal Medicine

## 2018-08-29 DIAGNOSIS — H353221 Exudative age-related macular degeneration, left eye, with active choroidal neovascularization: Secondary | ICD-10-CM | POA: Diagnosis not present

## 2018-08-29 DIAGNOSIS — H353123 Nonexudative age-related macular degeneration, left eye, advanced atrophic without subfoveal involvement: Secondary | ICD-10-CM | POA: Diagnosis not present

## 2018-08-29 DIAGNOSIS — H401132 Primary open-angle glaucoma, bilateral, moderate stage: Secondary | ICD-10-CM | POA: Diagnosis not present

## 2018-08-29 NOTE — Telephone Encounter (Signed)
Scheduled per 08/28, called patient and left a voicemail. Calender will be mailed.

## 2018-09-26 DIAGNOSIS — H353221 Exudative age-related macular degeneration, left eye, with active choroidal neovascularization: Secondary | ICD-10-CM | POA: Diagnosis not present

## 2018-10-03 DIAGNOSIS — M5442 Lumbago with sciatica, left side: Secondary | ICD-10-CM | POA: Diagnosis not present

## 2018-10-03 DIAGNOSIS — I10 Essential (primary) hypertension: Secondary | ICD-10-CM | POA: Diagnosis not present

## 2018-10-03 DIAGNOSIS — Z23 Encounter for immunization: Secondary | ICD-10-CM | POA: Diagnosis not present

## 2018-10-03 DIAGNOSIS — R600 Localized edema: Secondary | ICD-10-CM | POA: Diagnosis not present

## 2018-10-06 DIAGNOSIS — H401131 Primary open-angle glaucoma, bilateral, mild stage: Secondary | ICD-10-CM | POA: Diagnosis not present

## 2018-10-24 DIAGNOSIS — H353221 Exudative age-related macular degeneration, left eye, with active choroidal neovascularization: Secondary | ICD-10-CM | POA: Diagnosis not present

## 2018-10-24 DIAGNOSIS — H353123 Nonexudative age-related macular degeneration, left eye, advanced atrophic without subfoveal involvement: Secondary | ICD-10-CM | POA: Diagnosis not present

## 2018-10-30 DIAGNOSIS — Z1159 Encounter for screening for other viral diseases: Secondary | ICD-10-CM | POA: Diagnosis not present

## 2018-11-04 DIAGNOSIS — Z86010 Personal history of colonic polyps: Secondary | ICD-10-CM | POA: Diagnosis not present

## 2018-11-24 DIAGNOSIS — H353113 Nonexudative age-related macular degeneration, right eye, advanced atrophic without subfoveal involvement: Secondary | ICD-10-CM | POA: Diagnosis not present

## 2018-11-24 DIAGNOSIS — H401132 Primary open-angle glaucoma, bilateral, moderate stage: Secondary | ICD-10-CM | POA: Diagnosis not present

## 2018-11-24 DIAGNOSIS — H353221 Exudative age-related macular degeneration, left eye, with active choroidal neovascularization: Secondary | ICD-10-CM | POA: Diagnosis not present

## 2019-01-12 NOTE — Progress Notes (Signed)
Cardiology Office Note   Date:  01/21/2019   ID:  Cody Tate, DOB [birth year], MRN [id number]  PCP:  [name], MD  Cardiologist:   [name], MD   No chief complaint on file.     History of Present Illness: Cody Tate is a 69 y.o. male who presents for f/u AS small LVOT gradient and HTN .  Former smoker with HTN and COPD. Seen by Leonia Reeves years ago and had normal cath Preoperative testing concern for abnormal ECG  ECG from 01/02/17 reviewed and showed SR rate 65 biphasic T waves in V3-V6 Changes new compared to 2013  01/10/18 Nuclear study normal EF 67% TTE normal EF 65-70% mild LVOT gradient mild AS mean gradient 11 mmHg   Had uncomplicated nerve stimulator implant with Dr [name] for failed L1-5 fusion on 01/16/18 Unfortunately it has not helped much weight is up more dyspnea and some LE edema Current inhalers not working well    Past Medical History:  Diagnosis Date  . Arthritis   . BPH (benign prostatic hyperplasia)   . COPD (chronic obstructive pulmonary disease) ([place])   . Enlarged prostate   . GERD (gastroesophageal reflux disease)   . H/O hiatal hernia   . H/O wheezing    occ inhaler usage  . Heart murmur   . Hypertension   . Meningioma [facility])     Past Surgical History:  Procedure Laterality Date  . BACK SURGERY  06,07,08   lam x3 cerv x 1  . CERVICAL FUSION    . CYSTOSCOPY     69 yrs old  . EYE SURGERY     bil  . KNEE ARTHROSCOPY  10   lft  . KNEE ARTHROSCOPY Left 07/23/2012   Procedure: LEFT MEDIAL MENISCAL DEBRIDEMENT AND CHONDROPLASTY;  Surgeon: [name], MD;  Location: [facility];  Service: Orthopedics;  Laterality: Left;  . LUMBAR FUSION  02/22/2014   DR POOL  . PILONIDAL CYST EXCISION    . SINUS EXPLORATION    . SPINAL CORD STIMULATOR INSERTION N/A 01/16/2018   Procedure: LUMBAR SPINAL CORD STIMULATOR INSERTION;  Surgeon: [name], MD;  Location: [place];  Service: Neurosurgery;  Laterality: N/A;  LUMBAR SPINAL CORD STIMULATOR  INSERTION  . WRIST GANGLION EXCISION     rt  . WRIST SURGERY  09   cyst lft     Current Outpatient Medications  Medication Sig Dispense Refill  . albuterol (PROVENTIL HFA;VENTOLIN HFA) 108 (90 BASE) MCG/ACT inhaler Inhale 2 puffs into the lungs every 6 (six) hours as needed for wheezing or shortness of breath.     . Ascorbic Acid (VITAMIN C) 100 MG tablet Take 100 mg by mouth daily.    Marland Kitchen aspirin 81 MG tablet Take 81 mg by mouth daily.     . cholecalciferol (VITAMIN D3) 25 MCG (1000 UNIT) tablet Take 1,000 Units by mouth daily.    . diazepam (VALIUM) 2 MG tablet Take 2 mg by mouth 2 (two) times daily as needed for anxiety.    . diphenhydrAMINE (BENADRYL) 25 mg capsule Take 25 mg by mouth at bedtime as needed for sleep.    . fluticasone (FLONASE) 50 MCG/ACT nasal spray Place 2 sprays into both nostrils daily.     Marland Kitchen gabapentin (NEURONTIN) 300 MG capsule Take 300 mg by mouth 3 (three) times daily.     Marland Kitchen guaifenesin (HUMIBID E) 400 MG TABS Take 400 mg by mouth every 4 (four) hours as needed (for  congestion or mucus).     . hydrochlorothiazide (HYDRODIURIL) 25 MG tablet Take 25 mg by mouth every morning.     Marland Kitchen ibuprofen (ADVIL,MOTRIN) 200 MG tablet Take 600 mg by mouth every 6 (six) hours as needed for headache or moderate pain.     Marland Kitchen latanoprost (XALATAN) 0.005 % ophthalmic solution Place 1 drop into both eyes at bedtime.     . magnesium hydroxide (MILK OF MAGNESIA) 400 MG/5ML suspension Take 15 mLs by mouth at bedtime.     . metoprolol succinate (TOPROL-XL) 100 MG 24 hr tablet Take 100 mg by mouth at bedtime. Take with or immediately following a meal.    . Multiple Vitamins-Minerals (MULTIVITAMIN ADULT PO) Take 1 tablet by mouth daily.     Marland Kitchen omeprazole (PRILOSEC) 40 MG capsule Take 40 mg by mouth every morning.    Marland Kitchen oxyCODONE-acetaminophen (PERCOCET/ROXICET) 5-325 MG per tablet Take 2 tablets by mouth every 6 (six) hours as needed for moderate pain.     Marland Kitchen oxymetazoline (AFRIN) 0.05 % nasal  spray Place 2 sprays into both nostrils 2 (two) times daily as needed for congestion.    . Polyvinyl Alcohol-Povidone PF (REFRESH) 1.4-0.6 % SOLN Place 2 drops into both eyes 2 (two) times daily as needed (for dry eyes).    . pseudoephedrine (SUDAFED) 30 MG tablet Take 60 mg by mouth 2 (two) times daily as needed for congestion.     . ramipril (ALTACE) 10 MG capsule Take 10 mg by mouth every morning.     . Simethicone (GAS-X EXTRA STRENGTH) 125 MG CAPS Take 125 mg by mouth 4 (four) times daily as needed (for gas).     . simvastatin (ZOCOR) 20 MG tablet Take 20 mg by mouth at bedtime.     Marland Kitchen SPIRIVA RESPIMAT 2.5 MCG/ACT AERS Inhale 2 puffs into the lungs daily.    Marland Kitchen spironolactone (ALDACTONE) 25 MG tablet Take 12.5 mg by mouth at bedtime.     . Tamsulosin HCl (FLOMAX) 0.4 MG CAPS Take 0.4 mg by mouth 2 (two) times daily.     No current facility-administered medications for this visit.    Allergies:   Adhesive [tape] and Other    Social History:  The patient  reports that he quit smoking about 18 years ago. His smoking use included cigarettes. He smoked 2.00 packs per day for 0.00 years. He has never used smokeless tobacco. He reports that he does not drink alcohol or use drugs.   Family History:  The patient's family history is not on file.    ROS:  Please see the history of present illness.   Otherwise, review of systems are positive for none.   All other systems are reviewed and negative.    PHYSICAL EXAM: VS:  BP (!) 150/76   Pulse 100   Ht 5' 6.5" (1.689 m)   Wt 228 lb 6.4 oz (103.6 kg)   SpO2 95%   BMI 36.31 kg/m  , BMI Body mass index is 36.31 kg/m. Affect appropriate Healthy:  appears stated age 74: normal Neck supple with no adenopathy JVP normal no bruits no thyromegaly Lungs diffuse rhonchi and  wheezing and good diaphragmatic motion Heart:  S1/S2 SEM murmur, no rub, gallop or click PMI normal Abdomen: benighn, BS positve, no tenderness, no AAA no bruit.  No HSM  or HJR Distal pulses intact with no bruits Plus one bilateral edema Neuro non-focal Skin warm and dry Previous lumbar fusion with nerve stimulator in place  EKG:  See HPI  01/13/18 SR rate 77 RBBB lateral T wave changes    Recent Labs: No results found for requested labs within last 8760 hours.    Lipid Panel No results found for: CHOL, TRIG, HDL, CHOLHDL, VLDL, LDLCALC, LDLDIRECT    Wt Readings from Last 3 Encounters:  01/21/19 228 lb 6.4 oz (103.6 kg)  08/28/18 222 lb 12.8 oz (101.1 kg)  01/16/18 215 lb (97.5 kg)      Other studies Reviewed: Myovue 01/10/18 Echo 01/10/18  .    ASSESSMENT AND PLAN:  1. Abnormal ECG:  Likely form HTN and LVH / valve disease  Normal myovue 01/10/18  2. COPD:  Needs referral to [facility]FT[facility] delayed due to COVID  3. HTN:  Well controlled.  Continue current medications and low sodium Dash type diet.   4. HLD  Continue statin labs with Dr Felipa Eth  5. AS:  Mild  mean gradient 11 peak 20 mmhg TTE done 01/10/18 f/u echo ordered Also small LVOT gradient at rest Continue beta blocker avoid excess  diuretics  6. Back Pain: post nerve stimulator placement Dr [name] 01/16/18 previous L1-5 fusion  7. Edema:  Dependant from obesity continue aldactone at night and hydrodiuril in am    Current medicines are reviewed at length with the patient today.  The patient does not have concerns regarding medicines.  The following changes have been made:  no change  Labs/ tests ordered today include:  Echo for AS/LVOT gradient  Refer to [facility]   No orders of the defined types were placed in this encounter.    Disposition:   FU with cardiology in a year     Signed, [name], MD  01/21/2019 9:58 AM    [address], [place], [place]  [address] Phone: [phone]; Fax: [phone]

## 2019-01-21 ENCOUNTER — Encounter: Payer: Self-pay | Admitting: Cardiovascular Disease

## 2019-01-21 ENCOUNTER — Ambulatory Visit (INDEPENDENT_AMBULATORY_CARE_PROVIDER_SITE_OTHER): Payer: Medicare Other | Admitting: Cardiovascular Disease

## 2019-01-21 ENCOUNTER — Other Ambulatory Visit: Payer: Self-pay

## 2019-01-21 VITALS — BP 150/76 | HR 100 | Ht 66.5 in | Wt 228.4 lb

## 2019-01-21 DIAGNOSIS — I350 Nonrheumatic aortic (valve) stenosis: Secondary | ICD-10-CM | POA: Diagnosis not present

## 2019-01-21 DIAGNOSIS — J449 Chronic obstructive pulmonary disease, unspecified: Secondary | ICD-10-CM

## 2019-01-21 NOTE — Patient Instructions (Addendum)
Medication Instructions:   *If you need a refill on your cardiac medications before your next appointment, please call your pharmacy*  Lab Work:  If you have labs (blood work) drawn today and your tests are completely normal, you will receive your results only by: Marland Kitchen MyChart Message (if you have MyChart) OR . A paper copy in the mail If you have any lab test that is abnormal or we need to change your treatment, we will call you to review the results.  Testing/Procedures: Your physician has requested that you have an echocardiogram. Echocardiography is a painless test that uses sound waves to create images of your heart. It provides your doctor with information about the size and shape of your heart and how well your heart's chambers and valves are working. This procedure takes approximately one hour. There are no restrictions for this procedure.  Follow-Up: At [facility], you and your health needs are our priority.  As part of our continuing mission to provide you with exceptional heart care, we have created designated Provider Care Teams.  These Care Teams include your primary Cardiologist (physician) and Advanced Practice Providers (APPs -  Physician Assistants and Nurse Practitioners) who all work together to provide you with the care you need, when you need it.  Your next appointment:   12 month(s)  The format for your next appointment:   In Person  Provider:   You may see Dr. Johnsie Cancel or one of the following Advanced Practice Providers on your designated Care Team:    Truitt Merle, NP  Cecilie Kicks, NP  Kathyrn Drown, NP  You have been referred to Pulmonology, as soon as possible.

## 2019-01-29 ENCOUNTER — Other Ambulatory Visit: Payer: Self-pay

## 2019-01-29 ENCOUNTER — Other Ambulatory Visit (HOSPITAL_COMMUNITY): Payer: Medicare Other

## 2019-01-29 ENCOUNTER — Encounter: Payer: Self-pay | Admitting: Critical Care Medicine

## 2019-01-29 ENCOUNTER — Ambulatory Visit (INDEPENDENT_AMBULATORY_CARE_PROVIDER_SITE_OTHER): Payer: Medicare Other | Admitting: Critical Care Medicine

## 2019-01-29 VITALS — BP 142/70 | HR 84 | Temp 97.1°F | Ht 66.0 in | Wt 226.8 lb

## 2019-01-29 DIAGNOSIS — J329 Chronic sinusitis, unspecified: Secondary | ICD-10-CM

## 2019-01-29 DIAGNOSIS — R600 Localized edema: Secondary | ICD-10-CM | POA: Diagnosis not present

## 2019-01-29 DIAGNOSIS — J449 Chronic obstructive pulmonary disease, unspecified: Secondary | ICD-10-CM | POA: Diagnosis not present

## 2019-01-29 MED ORDER — AZELASTINE HCL 0.1 % NA SOLN: 2.0000 | Freq: Two times a day (BID) | NASAL | 12 refills | Status: DC

## 2019-01-29 MED ORDER — TRELEGY ELLIPTA 100-62.5-25 MCG/INH IN AEPB: 1.0000 | INHALATION_SPRAY | Freq: Every day | RESPIRATORY_TRACT | 3 refills | Status: DC

## 2019-01-29 MED ORDER — COMBIVENT RESPIMAT 20-100 MCG/ACT IN AERS: 1.0000 | INHALATION_SPRAY | Freq: Four times a day (QID) | RESPIRATORY_TRACT | 11 refills | Status: DC

## 2019-01-29 MED ORDER — FLUTTER DEVI: 1.0000 | Freq: Every day | 0 refills | Status: DC

## 2019-01-29 MED ORDER — MONTELUKAST SODIUM 10 MG PO TABS: 10.0000 mg | ORAL_TABLET | Freq: Every day | ORAL | 11 refills | Status: DC

## 2019-01-29 NOTE — Progress Notes (Signed)
Synopsis: Referred in January 2021 for CODP by Josue Hector, MD.  Subjective:   PATIENT ID: Cody Tate GENDER: male DOB: 1950/11/03, MRN: [id number]  Chief Complaint  Patient presents with  . Consult    Patient is here to be seen for COPD and mild emphysema. Patient is having shortness of breath that has got worse over the last year. Patient has a productive cough with yellow/green sputum. Patient is short of breath all the time and is worse with exertion and also has some wheezing.    Cody Tate is a 69 year old gentleman who presents for evaluation of severe dyspnea on exertion and wheezing have been worsening for at least the past year.  He has chest congestion with a cough productive of thick yellow sputum.  He has trouble clearing sputum, but albuterol helps.  He was diagnosed with COPD on spirometry several years ago, and has been on Spiriva once daily.  He is using albuterol multiple times a day, more than every 4 hours.  He is unsure if Spiriva is helping or making his symptoms worse; he sometimes feels more short of breath after using it.  He was treated with prednisone in August and doxycycline in December for acute exacerbation; the doxycycline in December did not improve his symptoms.  He is able to walk less than 1 block on flat ground.  He is able to walk around his house, but is unable to both walk and talk at the same time.  He denies fever, chills, sweats.  He has chronic GERD, which is well controlled on omeprazole and dietary modifications.  He has chronic sinus issues with severe postnasal drip.  He has been taking Sudafed, Mucinex, and using Afrin nasal spray, which she has been on for several years.  He is using steroid nasal spray, which only helps for a few hours.  He has chronic aortic stenosis, and has a follow-up echocardiogram tomorrow.  He has noticed more lower extremity edema recently, especially on the left.  He still working on cutting down his sodium, but is  diuretics were not increased.  He has gained 10 to 12 pounds recently, which he thinks is more due to diet than edema.  He quit smoking in 2002 after 35 years x 1.5 packs/day.  He has never felt like his breathing was normal.  In elementary school he was not able to keep up with the other kids.  He always had shortness of breath when he was doing his physical training during his [employer].  He was born full-term infant as far as he knows.  He had an uncle with COPD, but otherwise there is no family history of lung disease.     Past Medical History:  Diagnosis Date  . Arthritis   . BPH (benign prostatic hyperplasia)   . COPD (chronic obstructive pulmonary disease) ([place])   . Enlarged prostate   . GERD (gastroesophageal reflux disease)   . Glaucoma   . H/O hiatal hernia   . H/O wheezing    occ inhaler usage  . Heart murmur   . Hypertension   . Meningioma ([place])   . Pulmonary nodules    resolved on 2019 follow up CT     Family History  Problem Relation Age of Onset  . COPD Maternal Uncle      Past Surgical History:  Procedure Laterality Date  . BACK SURGERY  06,07,08   lam x3 cerv x 1  . CERVICAL FUSION    .  CYSTOSCOPY     69 yrs old  . EYE SURGERY     bil  . KNEE ARTHROSCOPY  10   lft  . KNEE ARTHROSCOPY Left 07/23/2012   Procedure: LEFT MEDIAL MENISCAL DEBRIDEMENT AND CHONDROPLASTY;  Surgeon: [name], MD;  Location: [facility];  Service: Orthopedics;  Laterality: Left;  . LUMBAR FUSION  02/22/2014   DR POOL  . PILONIDAL CYST EXCISION    . SINUS EXPLORATION    . SPINAL CORD STIMULATOR INSERTION N/A 01/16/2018   Procedure: LUMBAR SPINAL CORD STIMULATOR INSERTION;  Surgeon: [name], MD;  Location: [facility];  Service: Neurosurgery;  Laterality: N/A;  LUMBAR SPINAL CORD STIMULATOR INSERTION  . WRIST GANGLION EXCISION     rt  . WRIST SURGERY  09   cyst lft    Social History   Socioeconomic History  . Marital status: Married    Spouse name: Not on file  .  Number of children: Not on file  . Years of education: Not on file  . Highest education level: Not on file  Occupational History  . Not on file  Tobacco Use  . Smoking status: Former Smoker    Packs/day: 1.50    Years: 35.00    Pack years: 52.50    Types: Cigarettes    Quit date: 05/03/2000    Years since quitting: 18.7  . Smokeless tobacco: Never Used  . Tobacco comment: quit alcohol 73  Substance and Sexual Activity  . Alcohol use: No  . Drug use: No  . Sexual activity: Not Currently  Other Topics Concern  . Not on file  Social History Narrative   08-20-17 Unable to ask abuse questions wife with him today.   Social Determinants of Health   Financial Resource Strain:   . Difficulty of Paying Living Expenses: Not on file  Food Insecurity:   . Worried About Charity fundraiser in the Last Year: Not on file  . Ran Out of Food in the Last Year: Not on file  Transportation Needs:   . Lack of Transportation (Medical): Not on file  . Lack of Transportation (Non-Medical): Not on file  Physical Activity:   . Days of Exercise per Week: Not on file  . Minutes of Exercise per Session: Not on file  Stress:   . Feeling of Stress : Not on file  Social Connections:   . Frequency of Communication with Friends and Family: Not on file  . Frequency of Social Gatherings with Friends and Family: Not on file  . Attends Religious Services: Not on file  . Active Member of Clubs or Organizations: Not on file  . Attends Archivist Meetings: Not on file  . Marital Status: Not on file  Intimate Partner Violence:   . Fear of Current or Ex-Partner: Not on file  . Emotionally Abused: Not on file  . Physically Abused: Not on file  . Sexually Abused: Not on file     Allergies  Allergen Reactions  . Adhesive [Tape] Itching, Rash and Other (See Comments)    Occlusive tape and bandaids  . Other Itching, Rash and Other (See Comments)    VICRYL Sutures     Immunization History    Administered Date(s) Administered  . DTP 06/01/2001  . Influenza Split 10/16/2010, 10/08/2012, 10/24/2012, 10/20/2013  . Influenza, High Dose Seasonal PF 10/22/2014, 09/07/2015  . Influenza-Unspecified 10/03/2018  . Pneumococcal Conjugate-13 10/22/2014  . Pneumococcal Polysaccharide-23 02/10/2015  . Tdap 09/18/2011, 05/17/2012  .  Zoster 08/04/2010  . Zoster Recombinat (Shingrix) 08/18/2018    Outpatient Medications Prior to Visit  Medication Sig Dispense Refill  . albuterol (PROVENTIL HFA;VENTOLIN HFA) 108 (90 BASE) MCG/ACT inhaler Inhale 2 puffs into the lungs every 6 (six) hours as needed for wheezing or shortness of breath.     . Ascorbic Acid (VITAMIN C) 100 MG tablet Take 1,000 mg by mouth daily.     Marland Kitchen aspirin 81 MG tablet Take 81 mg by mouth daily.     . cholecalciferol (VITAMIN D3) 25 MCG (1000 UNIT) tablet Take 1,000 Units by mouth daily.    . diazepam (VALIUM) 2 MG tablet Take 2 mg by mouth 2 (two) times daily as needed for anxiety. Half tablet twice a day as needed    . diphenhydrAMINE (BENADRYL) 25 mg capsule Take 25 mg by mouth at bedtime as needed for sleep.    . fluticasone (FLONASE) 50 MCG/ACT nasal spray Place 2 sprays into both nostrils daily.     Marland Kitchen gabapentin (NEURONTIN) 300 MG capsule Take 300 mg by mouth 3 (three) times daily.     Marland Kitchen guaifenesin (HUMIBID E) 400 MG TABS Take 400 mg by mouth every 4 (four) hours as needed (for congestion or mucus).     . hydrochlorothiazide (HYDRODIURIL) 25 MG tablet Take 25 mg by mouth every morning.     Marland Kitchen ibuprofen (ADVIL,MOTRIN) 200 MG tablet Take 600 mg by mouth every 6 (six) hours as needed for headache or moderate pain.     Marland Kitchen latanoprost (XALATAN) 0.005 % ophthalmic solution Place 1 drop into both eyes at bedtime.     . magnesium hydroxide (MILK OF MAGNESIA) 400 MG/5ML suspension Take 15 mLs by mouth at bedtime.     . metoprolol succinate (TOPROL-XL) 100 MG 24 hr tablet Take 100 mg by mouth at bedtime. Take with or immediately  following a meal.    . Multiple Vitamins-Minerals (MULTIVITAMIN ADULT PO) Take 1 tablet by mouth daily.     Marland Kitchen omeprazole (PRILOSEC) 40 MG capsule Take 40 mg by mouth every morning.    . OXYCODONE ER PO Take 10 mg by mouth every 5 (five) hours as needed.    Marland Kitchen oxymetazoline (AFRIN) 0.05 % nasal spray Place 2 sprays into both nostrils 2 (two) times daily as needed for congestion.    . Polyvinyl Alcohol-Povidone PF (REFRESH) 1.4-0.6 % SOLN Place 2 drops into both eyes 2 (two) times daily as needed (for dry eyes).    . pseudoephedrine (SUDAFED) 30 MG tablet Take 60 mg by mouth 2 (two) times daily as needed for congestion.     . ramipril (ALTACE) 10 MG capsule Take 10 mg by mouth every morning.     . Simethicone (GAS-X EXTRA STRENGTH) 125 MG CAPS Take 125 mg by mouth 4 (four) times daily as needed (for gas).     . simvastatin (ZOCOR) 20 MG tablet Take 20 mg by mouth at bedtime.     Marland Kitchen SPIRIVA RESPIMAT 2.5 MCG/ACT AERS Inhale 2 puffs into the lungs daily.    Marland Kitchen spironolactone (ALDACTONE) 25 MG tablet Take 12.5 mg by mouth at bedtime.     . Tamsulosin HCl (FLOMAX) 0.4 MG CAPS Take 0.4 mg by mouth 2 (two) times daily.    Marland Kitchen oxyCODONE-acetaminophen (PERCOCET/ROXICET) 5-325 MG per tablet Take 2 tablets by mouth every 6 (six) hours as needed for moderate pain.      No facility-administered medications prior to visit.    Review of Systems  Constitutional:  Negative for chills, fever and weight loss.  HENT: Positive for congestion.        Postnasal drip  Respiratory: Positive for cough, shortness of breath and wheezing.   Cardiovascular: Positive for leg swelling. Negative for chest pain.  Gastrointestinal: Negative for heartburn.  Genitourinary:       Prostate issues/urinary retention  Neurological:       Balance issues     Objective:   Vitals:   01/29/19 1445  BP: (!) 142/70  Pulse: 84  Temp: (!) 97.1 F (36.2 C)  TempSrc: Temporal  SpO2: 98%  Weight: 226 lb 12.8 oz (102.9 kg)  Height: 5'  6" (1.676 m)   98% on   RA BMI Readings from Last 3 Encounters:  01/29/19 36.61 kg/m  01/21/19 36.31 kg/m  08/28/18 35.42 kg/m   Wt Readings from Last 3 Encounters:  01/29/19 226 lb 12.8 oz (102.9 kg)  01/21/19 228 lb 6.4 oz (103.6 kg)  08/28/18 222 lb 12.8 oz (101.1 kg)    Physical Exam Vitals reviewed.  Constitutional:      General: He is not in acute distress.    Appearance: He is obese.  HENT:     Head: Normocephalic and atraumatic.     Nose:     Comments: Deferred due to masking requirement.    Mouth/Throat:     Comments: Deferred due to masking requirement. Cardiovascular:     Rate and Rhythm: Normal rate and regular rhythm.     Heart sounds: Murmur present.  Pulmonary:     Comments: Breathing comfortably on room air, no conversational dyspnea.  Diffuse bilateral wheezing. Abdominal:     General: There is no distension.     Palpations: Abdomen is soft.     Tenderness: There is no abdominal tenderness.  Musculoskeletal:        General: No deformity.     Cervical back: Neck supple.     Comments: BLE edema, L> R  Lymphadenopathy:     Cervical: No cervical adenopathy.  Skin:    General: Skin is warm and dry.     Findings: No rash.  Neurological:     Mental Status: He is alert.     Comments: Ambulates with walker.  No obvious focal deficits.  Psychiatric:        Mood and Affect: Mood normal.        Behavior: Behavior normal.      CBC    Component Value Date/Time   WBC 6.7 01/02/2018 1450   RBC 5.29 01/02/2018 1450   HGB 13.5 01/02/2018 1450   HCT 43.7 01/02/2018 1450   PLT 318 01/02/2018 1450   MCV 82.6 01/02/2018 1450   MCH 25.5 (L) 01/02/2018 1450   MCHC 30.9 01/02/2018 1450   RDW 13.7 01/02/2018 1450   LYMPHSABS 1.3 02/12/2014 1313   MONOABS 0.7 02/12/2014 1313   EOSABS 0.1 02/12/2014 1313   BASOSABS 0.1 02/12/2014 1313    CHEMISTRY No results for input(s): NA, K, CL, CO2, GLUCOSE, BUN, CREATININE, CALCIUM, MG, PHOS in the last 168  hours. CrCl cannot be calculated (Patient's most recent lab result is older than the maximum 21 days allowed.).   Chest Imaging- films reviewed: CT chest 03/01/2017- moderate airway thickening, likely mild bronchiectasis in lower lobes.  No significant emphysema.  No mediastinal or hilar adenopathy.  Patulous esophagus.  Pulmonary Functions Testing Results: No flowsheet data found.  Per [facility] records 2018: spirometry 01/2013 FEV1/FVC equals 64%, FEV1 equals 72% mild COPD  Echocardiogram 01/10/2018: LVEF 65 to 70%.  Grade 1 diastolic dysfunction, elevated LVEDP.  Mild AS.  LA, RV, RA.  Trivial MR and , otherwise normal valves.  NM SPECT 01/10/2018: LVEF 67%, hyperdynamic function.  Abnormal baseline EKG, no ischemic changes during test.  Normal myocardial perfusion; low risk study.    Assessment & Plan:     ICD-10-CM   1. Localized edema  R60.0 VAS [place] LOWER EXTREMITY VENOUS (DVT)  2. Chronic obstructive pulmonary disease, unspecified COPD type ([place])  J44.9   3. Chronic sinusitis, unspecified location  J32.9     COPD-uncontrolled.  Question if he has an asthma overlap. 2 mild AE in the past year. GOLD D. -Start triple inhaled therapy-Trelegy once daily.  He should rinse his mouth after every use.  Stop Spiriva. -Combivent every 4 hours as needed -Adding montelukast daily -Aggressive rhinosinusitis management -Flutter valve -Recommend Covid vaccine when available -Continue Covid precautions-social distancing, mask wearing, handwashing. -PFTs -Follow-up imaging to evaluate bronchiectasis may be helpful if his symptoms are controlled.  Alternatively, if he continues to have productive cough, I will likely add hypertonic saline nebs in the future.  At this point I agree that it could worsen his wheezing.  Allergic rhinosinusitis -Adding azelastine nasal spray -Continue intranasal steroids daily -Adding Singulair -Unable to add oral antihistamine due to history of urinary retention and  prostate problems -Recommended that he stop Afrin, which he is reluctant to do due to his experience with rebound  Asymmetric leg edema -Left lower extremity Doppler -Follow-up with cardiology as planned; likely would benefit from increased diuresis.   RTC in 1 month.   Current Outpatient Medications:  .  albuterol (PROVENTIL HFA;VENTOLIN HFA) 108 (90 BASE) MCG/ACT inhaler, Inhale 2 puffs into the lungs every 6 (six) hours as needed for wheezing or shortness of breath. , Disp: , Rfl:  .  Ascorbic Acid (VITAMIN C) 100 MG tablet, Take 1,000 mg by mouth daily. , Disp: , Rfl:  .  aspirin 81 MG tablet, Take 81 mg by mouth daily. , Disp: , Rfl:  .  cholecalciferol (VITAMIN D3) 25 MCG (1000 UNIT) tablet, Take 1,000 Units by mouth daily., Disp: , Rfl:  .  diazepam (VALIUM) 2 MG tablet, Take 2 mg by mouth 2 (two) times daily as needed for anxiety. Half tablet twice a day as needed, Disp: , Rfl:  .  diphenhydrAMINE (BENADRYL) 25 mg capsule, Take 25 mg by mouth at bedtime as needed for sleep., Disp: , Rfl:  .  fluticasone (FLONASE) 50 MCG/ACT nasal spray, Place 2 sprays into both nostrils daily. , Disp: , Rfl:  .  gabapentin (NEURONTIN) 300 MG capsule, Take 300 mg by mouth 3 (three) times daily. , Disp: , Rfl:  .  guaifenesin (HUMIBID E) 400 MG TABS, Take 400 mg by mouth every 4 (four) hours as needed (for congestion or mucus). , Disp: , Rfl:  .  hydrochlorothiazide (HYDRODIURIL) 25 MG tablet, Take 25 mg by mouth every morning. , Disp: , Rfl:  .  ibuprofen (ADVIL,MOTRIN) 200 MG tablet, Take 600 mg by mouth every 6 (six) hours as needed for headache or moderate pain. , Disp: , Rfl:  .  latanoprost (XALATAN) 0.005 % ophthalmic solution, Place 1 drop into both eyes at bedtime. , Disp: , Rfl:  .  magnesium hydroxide (MILK OF MAGNESIA) 400 MG/5ML suspension, Take 15 mLs by mouth at bedtime. , Disp: , Rfl:  .  metoprolol succinate (TOPROL-XL) 100 MG 24 hr tablet, Take 100 mg  by mouth at bedtime. Take with or  immediately following a meal., Disp: , Rfl:  .  Multiple Vitamins-Minerals (MULTIVITAMIN ADULT PO), Take 1 tablet by mouth daily. , Disp: , Rfl:  .  omeprazole (PRILOSEC) 40 MG capsule, Take 40 mg by mouth every morning., Disp: , Rfl:  .  OXYCODONE ER PO, Take 10 mg by mouth every 5 (five) hours as needed., Disp: , Rfl:  .  oxymetazoline (AFRIN) 0.05 % nasal spray, Place 2 sprays into both nostrils 2 (two) times daily as needed for congestion., Disp: , Rfl:  .  Polyvinyl Alcohol-Povidone PF (REFRESH) 1.4-0.6 % SOLN, Place 2 drops into both eyes 2 (two) times daily as needed (for dry eyes)., Disp: , Rfl:  .  pseudoephedrine (SUDAFED) 30 MG tablet, Take 60 mg by mouth 2 (two) times daily as needed for congestion. , Disp: , Rfl:  .  ramipril (ALTACE) 10 MG capsule, Take 10 mg by mouth every morning. , Disp: , Rfl:  .  Simethicone (GAS-X EXTRA STRENGTH) 125 MG CAPS, Take 125 mg by mouth 4 (four) times daily as needed (for gas). , Disp: , Rfl:  .  simvastatin (ZOCOR) 20 MG tablet, Take 20 mg by mouth at bedtime. , Disp: , Rfl:  .  SPIRIVA RESPIMAT 2.5 MCG/ACT AERS, Inhale 2 puffs into the lungs daily., Disp: , Rfl:  .  spironolactone (ALDACTONE) 25 MG tablet, Take 12.5 mg by mouth at bedtime. , Disp: , Rfl:  .  Tamsulosin HCl (FLOMAX) 0.4 MG CAPS, Take 0.4 mg by mouth 2 (two) times daily., Disp: , Rfl:  .  azelastine (ASTELIN) 0.1 % nasal spray, Place 2 sprays into both nostrils 2 (two) times daily. Use in each nostril as directed, Disp: 30 mL, Rfl: 12 .  Fluticasone-Umeclidin-Vilant (TRELEGY ELLIPTA) 100-62.5-25 MCG/INH AEPB, Inhale 1 puff into the lungs daily., Disp: 3 each, Rfl: 3 .  Ipratropium-Albuterol (COMBIVENT RESPIMAT) 20-100 MCG/ACT AERS respimat, Inhale 1 puff into the lungs every 6 (six) hours., Disp: 4 g, Rfl: 11 .  montelukast (SINGULAIR) 10 MG tablet, Take 1 tablet (10 mg total) by mouth at bedtime., Disp: 30 tablet, Rfl: 11 .  Respiratory Therapy Supplies (FLUTTER) DEVI, 1 each by Does  not apply route daily., Disp: 1 each, Rfl: 0    [name], DO [facility] 01/29/2019 7:10 PM

## 2019-01-29 NOTE — Patient Instructions (Addendum)
Thank you for visiting Dr. Carlis Abbott at [facility]. We recommend the following: Orders Placed This Encounter  Procedures  . VAS [place] LOWER EXTREMITY VENOUS (DVT)   Stop spiriva when you start your new inhaler (Trelegy).   Meds ordered this encounter  Medications  . Respiratory Therapy Supplies (FLUTTER) DEVI    Sig: 1 each by Does not apply route daily.    Dispense:  1 each    Refill:  0  . Fluticasone-Umeclidin-Vilant (TRELEGY ELLIPTA) 100-62.5-25 MCG/INH AEPB    Sig: Inhale 1 puff into the lungs daily.    Dispense:  3 each    Refill:  3  . Ipratropium-Albuterol (COMBIVENT RESPIMAT) 20-100 MCG/ACT AERS respimat    Sig: Inhale 1 puff into the lungs every 6 (six) hours.    Dispense:  4 g    Refill:  11  . azelastine (ASTELIN) 0.1 % nasal spray    Sig: Place 2 sprays into both nostrils 2 (two) times daily. Use in each nostril as directed    Dispense:  30 mL    Refill:  12  . montelukast (SINGULAIR) 10 MG tablet    Sig: Take 1 tablet (10 mg total) by mouth at bedtime.    Dispense:  30 tablet    Refill:  11    Return in about 4 weeks (around 02/26/2019).    Please do your part to reduce the spread of COVID-19.

## 2019-01-30 ENCOUNTER — Ambulatory Visit (HOSPITAL_COMMUNITY)
Admission: RE | Admit: 2019-01-30 | Discharge: 2019-01-30 | Disposition: A | Discharge status: Home / Self Care | Payer: Medicare Other | Source: Ambulatory Visit | Attending: Internal Medicine | Admitting: Internal Medicine

## 2019-01-30 ENCOUNTER — Ambulatory Visit (HOSPITAL_BASED_OUTPATIENT_CLINIC_OR_DEPARTMENT_OTHER): Payer: Medicare Other

## 2019-01-30 ENCOUNTER — Telehealth: Payer: Self-pay | Admitting: Critical Care Medicine

## 2019-01-30 DIAGNOSIS — I350 Nonrheumatic aortic (valve) stenosis: Secondary | ICD-10-CM

## 2019-01-30 DIAGNOSIS — R600 Localized edema: Secondary | ICD-10-CM | POA: Insufficient documentation

## 2019-01-30 NOTE — Telephone Encounter (Signed)
ATC patient.  LM to call back when available. 

## 2019-01-30 NOTE — Telephone Encounter (Signed)
June wife is returning phone call.  June phone number is [phone].  May leave detailed message on voicemail.

## 2019-01-30 NOTE — Telephone Encounter (Signed)
Called and left a detailed msg as June requested letting her know she needs to check the pt's drug formulary and let us know what the covered alternatives are

## 2019-02-02 ENCOUNTER — Telehealth: Payer: Self-pay | Admitting: Critical Care Medicine

## 2019-02-02 NOTE — Telephone Encounter (Signed)
Please advise on a different inhaler that is covered under patient's formulary and which you would prefer we send to the pharmacy for him.

## 2019-02-03 ENCOUNTER — Telehealth: Payer: Self-pay | Admitting: Critical Care Medicine

## 2019-02-03 MED ORDER — INCRUSE ELLIPTA 62.5 MCG/INH IN AEPB: 1.0000 | INHALATION_SPRAY | Freq: Every day | RESPIRATORY_TRACT | 5 refills | Status: DC

## 2019-02-03 MED ORDER — BUDESONIDE-FORMOTEROL FUMARATE 160-4.5 MCG/ACT IN AERO: 2.0000 | INHALATION_SPRAY | Freq: Two times a day (BID) | RESPIRATORY_TRACT | 5 refills | Status: DC

## 2019-02-03 MED ORDER — SPIRIVA RESPIMAT 2.5 MCG/ACT IN AERS: 2.0000 | INHALATION_SPRAY | Freq: Every day | RESPIRATORY_TRACT | 3 refills | Status: DC

## 2019-02-03 NOTE — Telephone Encounter (Signed)
Spoke with Cody Tate, he states his insurance will not cover Incruse but they did cover the symbicort. His insurance will cover Spiriva but Cody Tate was advised to discontinue. He states if Dr. Carlis Abbott wants him on the Trelegy, then we may have to do an authorization. Please advise if you would like to change the inhalers to the covered medications.   [name], Symbicort, and Spiriva are covered Incruse not covered   [name], DO [facility] 01/29/2019 7:10 PM     Patient Instructions by [name], DO at 01/29/2019 2:30 PM Author: [name], DO Author Type: Physician Filed: 01/29/2019 3:23 PM  Note Status: Addendum [name]: Cosign Not Required Encounter Date: 01/29/2019  Editor: [name], DO (Physician)  Prior Versions: 1. [name], DO (Physician) at 01/29/2019 3:23 PM - Addendum   2. [name], DO (Physician) at 01/29/2019 3:22 PM - Addendum   3. [name], DO (Physician) at 01/29/2019 3:22 PM - Signed    Thank you for visiting Dr. Carlis Abbott at [facility]. We recommend the following:    Orders Placed This Encounter  Procedures  . VAS [place] LOWER EXTREMITY VENOUS (DVT)   Stop spiriva when you start your new inhaler (Trelegy).       Meds ordered this encounter  Medications  . Respiratory Therapy Supplies (FLUTTER) DEVI    Sig: 1 each by Does not apply route daily.    Dispense:  1 each    Refill:  0  . Fluticasone-Umeclidin-Vilant (TRELEGY ELLIPTA) 100-62.5-25 MCG/INH AEPB    Sig: Inhale 1 puff into the lungs daily.    Dispense:  3 each    Refill:  3  . Ipratropium-Albuterol (COMBIVENT RESPIMAT) 20-100 MCG/ACT AERS respimat    Sig: Inhale 1 puff into the lungs every 6 (six) hours.    Dispense:  4 g    Refill:  11  . azelastine (ASTELIN) 0.1 % nasal spray    Sig: Place 2 sprays into both nostrils 2 (two) times daily. Use in each nostril as directed    Dispense:  30 mL    Refill:  12  . montelukast  (SINGULAIR) 10 MG tablet    Sig: Take 1 tablet (10 mg total) by mouth at bedtime.    Dispense:  30 tablet    Refill:  11    Return in about 4 weeks (around 02/26/2019).

## 2019-02-03 NOTE — Telephone Encounter (Signed)
Called pt and advised message from the provider. Pt understood and verbalized understanding. Rx sent in and nothing further is needed.

## 2019-02-03 NOTE — Telephone Encounter (Signed)
Updated message in chart on 02/02/2019. Will close this encounter.

## 2019-02-03 NOTE — Telephone Encounter (Signed)
Since he is not doing the Trelegy, we can restart the Spiriva in addition to Symbicort.  Thank you!  LPC

## 2019-02-03 NOTE — Telephone Encounter (Signed)
We probably need to just do Incruse and Symbicort (the anoro has an extra medication that is similar to one that is in symbicort, but the Incruse should be covered if Anoro is). Thanks!  LPC

## 2019-02-03 NOTE — Telephone Encounter (Signed)
Spoke with pt. He is aware of Dr. Ainsley Spinner response. Rxs have been sent in. Nothing further was needed.

## 2019-02-12 DIAGNOSIS — E7800 Pure hypercholesterolemia, unspecified: Secondary | ICD-10-CM | POA: Diagnosis not present

## 2019-02-12 DIAGNOSIS — I10 Essential (primary) hypertension: Secondary | ICD-10-CM | POA: Diagnosis not present

## 2019-02-12 DIAGNOSIS — J449 Chronic obstructive pulmonary disease, unspecified: Secondary | ICD-10-CM | POA: Diagnosis not present

## 2019-02-16 DIAGNOSIS — H401132 Primary open-angle glaucoma, bilateral, moderate stage: Secondary | ICD-10-CM | POA: Diagnosis not present

## 2019-02-16 DIAGNOSIS — H353231 Exudative age-related macular degeneration, bilateral, with active choroidal neovascularization: Secondary | ICD-10-CM | POA: Diagnosis not present

## 2019-02-24 ENCOUNTER — Encounter: Payer: Self-pay | Admitting: Critical Care Medicine

## 2019-02-24 ENCOUNTER — Ambulatory Visit (INDEPENDENT_AMBULATORY_CARE_PROVIDER_SITE_OTHER): Payer: Medicare Other | Admitting: Critical Care Medicine

## 2019-02-24 ENCOUNTER — Other Ambulatory Visit: Payer: Self-pay

## 2019-02-24 VITALS — BP 144/76 | HR 70 | Temp 97.2°F | Ht 66.0 in | Wt 225.2 lb

## 2019-02-24 DIAGNOSIS — J449 Chronic obstructive pulmonary disease, unspecified: Secondary | ICD-10-CM

## 2019-02-24 DIAGNOSIS — J329 Chronic sinusitis, unspecified: Secondary | ICD-10-CM | POA: Diagnosis not present

## 2019-02-24 MED ORDER — PSEUDOEPHEDRINE HCL 60 MG PO TABS: 60.0000 mg | ORAL_TABLET | Freq: Two times a day (BID) | ORAL | 1 refills | Status: DC | PRN

## 2019-02-24 MED ORDER — TUDORZA PRESSAIR 400 MCG/ACT IN AEPB: 1.0000 | INHALATION_SPRAY | Freq: Two times a day (BID) | RESPIRATORY_TRACT | 11 refills | Status: DC

## 2019-02-24 NOTE — Progress Notes (Signed)
Synopsis: Referred in January 2021 for CODP by [name], MD.  Subjective:   PATIENT ID: Cody Tate GENDER: male DOB: 07-21-1950, MRN: [id number]  Chief Complaint  Patient presents with   Follow-up    Patient is wondering if the Spiriva is necessary. Patient states when he takes it he feels like throat is closing and has trouble swallowing and feels like he gets laryngitis.     Cody Tate is a 69 year old gentleman with a history of COPD and asthma overlap.  At his last visit he was started on Symbicort.  Although he had side effects to Spiriva, due to insurance coverage he was not able to switch this to Trelegy.  Spiriva causes hoarseness and throat irritation causing dysphagia.  No odynophagia.  He started Singulair at his last visit.  He continues to use Combivent as his rescue inhaler.  Since his last visit his breathing is significantly improved, he seldom wheezes, he is sleeping better, and his endurance is better.  He has chronic sinusitis for which he takes Afrin, Singulair, Flonase, azelastine, and Sudafed.  He is unable to take antihistamines due to urinary retention.  Azelastine was causing excessive nasal dryness, and he is decreased his dose to once daily dosing.  He is reluctant to stop Sudafed and Afrin due to rebound congestion.    OV 01/29/19: Cody Tate is a 69 year old gentleman who presents for evaluation of severe dyspnea on exertion and wheezing have been worsening for at least the past year.  He has chest congestion with a cough productive of thick yellow sputum.  He has trouble clearing sputum, but albuterol helps.  He was diagnosed with COPD on spirometry several years ago, and has been on Spiriva once daily.  He is using albuterol multiple times a day, more than every 4 hours.  He is unsure if Spiriva is helping or making his symptoms worse; he sometimes feels more short of breath after using it.  He was treated with prednisone in August and doxycycline in  December for acute exacerbation; the doxycycline in December did not improve his symptoms.  He is able to walk less than 1 block on flat ground.  He is able to walk around his house, but is unable to both walk and talk at the same time.  He denies fever, chills, sweats.  He has chronic GERD, which is well controlled on omeprazole and dietary modifications.  He has chronic sinus issues with severe postnasal drip.  He has been taking Sudafed, Mucinex, and using Afrin nasal spray, which she has been on for several years.  He is using steroid nasal spray, which only helps for a few hours.  He has chronic aortic stenosis, and has a follow-up echocardiogram tomorrow.  He has noticed more lower extremity edema recently, especially on the left.  He still working on cutting down his sodium, but is diuretics were not increased.  He has gained 10 to 12 pounds recently, which he thinks is more due to diet than edema.  He quit smoking in 2002 after 35 years x 1.5 packs/day.  He has never felt like his breathing was normal.  In elementary school he was not able to keep up with the other kids.  He always had shortness of breath when he was doing his physical training during his [employer].  He was born full-term infant as far as he knows.  He had an uncle with COPD, but otherwise there is no family history of lung disease.  Past Medical History:  Diagnosis Date   Arthritis    BPH (benign prostatic hyperplasia)    COPD (chronic obstructive pulmonary disease) (HCC)    Enlarged prostate    GERD (gastroesophageal reflux disease)    Glaucoma    H/O hiatal hernia    H/O wheezing    occ inhaler usage   Heart murmur    Hypertension    Meningioma (HCC)    Pulmonary nodules    resolved on 2019 follow up CT     Family History  Problem Relation Age of Onset   COPD Maternal Uncle      Past Surgical History:  Procedure Laterality Date   BACK SURGERY  06,07,08   lam x3 cerv x 1   CERVICAL  FUSION     CYSTOSCOPY     69 yrs old   EYE SURGERY     bil   KNEE ARTHROSCOPY  10   lft   KNEE ARTHROSCOPY Left 07/23/2012   Procedure: LEFT MEDIAL MENISCAL DEBRIDEMENT AND CHONDROPLASTY;  Surgeon: [name], MD;  Location: [facility];  Service: Orthopedics;  Laterality: Left;   LUMBAR FUSION  02/22/2014   DR POOL   PILONIDAL CYST EXCISION     SINUS EXPLORATION     SPINAL CORD STIMULATOR INSERTION N/A 01/16/2018   Procedure: LUMBAR SPINAL CORD STIMULATOR INSERTION;  Surgeon: [name], MD;  Location: [facility];  Service: Neurosurgery;  Laterality: N/A;  LUMBAR SPINAL CORD STIMULATOR INSERTION   WRIST GANGLION EXCISION     rt   WRIST SURGERY  09   cyst lft    Social History   Socioeconomic History   Marital status: Married    Spouse name: Not on file   Number of children: Not on file   Years of education: Not on file   Highest education level: Not on file  Occupational History   Not on file  Tobacco Use   Smoking status: Former Smoker    Packs/day: 1.50    Years: 35.00    Pack years: 52.50    Types: Cigarettes    Quit date: 05/03/2000    Years since quitting: 18.8   Smokeless tobacco: Never Used   Tobacco comment: quit alcohol 73  Substance and Sexual Activity   Alcohol use: No   Drug use: No   Sexual activity: Not Currently  Other Topics Concern   Not on file  Social History Narrative   08-20-17 Unable to ask abuse questions wife with him today.   Social Determinants of Health   Financial Resource Strain:    Difficulty of Paying Living Expenses: Not on file  Food Insecurity:    Worried About Charity fundraiser in the Last Year: Not on file   [employer] of Food in the Last Year: Not on file  Transportation Needs:    Lack of Transportation (Medical): Not on file   Lack of Transportation (Non-Medical): Not on file  Physical Activity:    Days of Exercise per Week: Not on file   Minutes of Exercise per Session: Not on file  Stress:      Feeling of Stress : Not on file  Social Connections:    Frequency of Communication with Friends and Family: Not on file   Frequency of Social Gatherings with Friends and Family: Not on file   Attends Religious Services: Not on file   Active Member of Clubs or Organizations: Not on file   Attends Archivist Meetings: Not  on file   Marital Status: Not on file  Intimate Partner Violence:    Fear of Current or Ex-Partner: Not on file   Emotionally Abused: Not on file   Physically Abused: Not on file   Sexually Abused: Not on file     Allergies  Allergen Reactions   Adhesive [Tape] Itching, Rash and Other (See Comments)    Occlusive tape and bandaids   Other Itching, Rash and Other (See Comments)    VICRYL Sutures     Immunization History  Administered Date(s) Administered   DTP 06/01/2001   Influenza Split 10/16/2010, 10/08/2012, 10/24/2012, 10/20/2013   Influenza, High Dose Seasonal PF 10/22/2014, 09/07/2015   Influenza-Unspecified 10/03/2018   PFIZER SARS-COV-2 Vaccination 02/13/2019   Pneumococcal Conjugate-13 10/22/2014   Pneumococcal Polysaccharide-23 02/10/2015   Tdap 09/18/2011, 05/17/2012   Zoster 08/04/2010   Zoster Recombinat (Shingrix) 08/18/2018    Outpatient Medications Prior to Visit  Medication Sig Dispense Refill   aspirin 81 MG tablet Take 81 mg by mouth daily.      azelastine (ASTELIN) 0.1 % nasal spray Place 2 sprays into both nostrils 2 (two) times daily. Use in each nostril as directed 30 mL 12   budesonide-formoterol (SYMBICORT) 160-4.5 MCG/ACT inhaler Inhale 2 puffs into the lungs 2 (two) times daily. 1 Inhaler 5   diazepam (VALIUM) 2 MG tablet Take 2 mg by mouth 2 (two) times daily as needed for anxiety. Half tablet twice a day as needed     diphenhydrAMINE (BENADRYL) 25 mg capsule Take 25 mg by mouth at bedtime as needed for sleep.     fluticasone (FLONASE) 50 MCG/ACT nasal spray Place 2 sprays into both  nostrils daily.      gabapentin (NEURONTIN) 300 MG capsule Take 300 mg by mouth 3 (three) times daily.      guaifenesin (HUMIBID E) 400 MG TABS Take 400 mg by mouth every 4 (four) hours as needed (for congestion or mucus).      hydrochlorothiazide (HYDRODIURIL) 25 MG tablet Take 25 mg by mouth every morning.      ibuprofen (ADVIL,MOTRIN) 200 MG tablet Take 600 mg by mouth every 6 (six) hours as needed for headache or moderate pain.      Ipratropium-Albuterol (COMBIVENT RESPIMAT) 20-100 MCG/ACT AERS respimat Inhale 1 puff into the lungs every 6 (six) hours. 4 g 11   latanoprost (XALATAN) 0.005 % ophthalmic solution Place 1 drop into both eyes at bedtime.      magnesium hydroxide (MILK OF MAGNESIA) 400 MG/5ML suspension Take 15 mLs by mouth at bedtime.      metoprolol succinate (TOPROL-XL) 100 MG 24 hr tablet Take 100 mg by mouth at bedtime. Take with or immediately following a meal.     montelukast (SINGULAIR) 10 MG tablet Take 1 tablet (10 mg total) by mouth at bedtime. 30 tablet 11   Multiple Vitamins-Minerals (MULTIVITAMIN ADULT PO) Take 1 tablet by mouth daily.      omeprazole (PRILOSEC) 40 MG capsule Take 40 mg by mouth every morning.     OXYCODONE ER PO Take 10 mg by mouth every 5 (five) hours as needed.     oxymetazoline (AFRIN) 0.05 % nasal spray Place 2 sprays into both nostrils 2 (two) times daily as needed for congestion.     Polyvinyl Alcohol-Povidone PF (REFRESH) 1.4-0.6 % SOLN Place 2 drops into both eyes 2 (two) times daily as needed (for dry eyes).     pseudoephedrine (SUDAFED) 30 MG tablet Take 60 mg by mouth  2 (two) times daily as needed for congestion.      ramipril (ALTACE) 10 MG capsule Take 10 mg by mouth every morning.      Respiratory Therapy Supplies (FLUTTER) DEVI 1 each by Does not apply route daily. 1 each 0   Simethicone (GAS-X EXTRA STRENGTH) 125 MG CAPS Take 125 mg by mouth 4 (four) times daily as needed (for gas).      simvastatin (ZOCOR) 20 MG  tablet Take 20 mg by mouth at bedtime.      spironolactone (ALDACTONE) 25 MG tablet Take 12.5 mg by mouth at bedtime.      Tamsulosin HCl (FLOMAX) 0.4 MG CAPS Take 0.4 mg by mouth 2 (two) times daily.     SPIRIVA RESPIMAT 2.5 MCG/ACT AERS Inhale 2 puffs into the lungs daily. 4 g 3   Ascorbic Acid (VITAMIN C) 100 MG tablet Take 1,000 mg by mouth daily.      cholecalciferol (VITAMIN D3) 25 MCG (1000 UNIT) tablet Take 1,000 Units by mouth daily.     umeclidinium bromide (INCRUSE ELLIPTA) 62.5 MCG/INH AEPB Inhale 1 puff into the lungs daily. 30 each 5   No facility-administered medications prior to visit.    Review of Systems  Constitutional: Negative for chills, fever and weight loss.  HENT: Positive for congestion.        Postnasal drip  Respiratory: Positive for cough, shortness of breath and wheezing.   Cardiovascular: Positive for leg swelling. Negative for chest pain.  Gastrointestinal: Negative for heartburn.  Genitourinary:       Prostate issues/urinary retention  Neurological:       Balance issues     Objective:   Vitals:   02/24/19 1518  BP: (!) 144/76  Pulse: 70  Temp: (!) 97.2 F (36.2 C)  TempSrc: Temporal  SpO2: 97%  Weight: 225 lb 3.2 oz (102.2 kg)  Height: 5\' 6"  (1.676 m)   97% on   RA BMI Readings from Last 3 Encounters:  02/24/19 36.35 kg/m  01/29/19 36.61 kg/m  01/21/19 36.31 kg/m   Wt Readings from Last 3 Encounters:  02/24/19 225 lb 3.2 oz (102.2 kg)  01/29/19 226 lb 12.8 oz (102.9 kg)  01/21/19 228 lb 6.4 oz (103.6 kg)    Physical Exam Vitals reviewed.  Constitutional:      Comments: Sitting in a wheelchair  HENT:     Head: Normocephalic and atraumatic.  Eyes:     General: No scleral icterus. Cardiovascular:     Rate and Rhythm: Normal rate and regular rhythm.  Pulmonary:     Comments: Breathing comfortably on room air, no conversational dyspnea.  No rhonchi or wheezing. Abdominal:     General: There is no distension.      Palpations: Abdomen is soft.  Musculoskeletal:        General: No deformity.     Cervical back: Neck supple.  Lymphadenopathy:     Cervical: No cervical adenopathy.  Skin:    General: Skin is warm and dry.     Findings: No rash.  Neurological:     General: No focal deficit present.     Mental Status: He is alert.     Coordination: Coordination normal.  Psychiatric:        Mood and Affect: Mood normal.        Behavior: Behavior normal.      CBC    Component Value Date/Time   WBC 6.7 01/02/2018 1450   RBC 5.29 01/02/2018 1450  HGB 13.5 01/02/2018 1450   HCT 43.7 01/02/2018 1450   PLT 318 01/02/2018 1450   MCV 82.6 01/02/2018 1450   MCH 25.5 (L) 01/02/2018 1450   MCHC 30.9 01/02/2018 1450   RDW 13.7 01/02/2018 1450   LYMPHSABS 1.3 02/12/2014 1313   MONOABS 0.7 02/12/2014 1313   EOSABS 0.1 02/12/2014 1313   BASOSABS 0.1 02/12/2014 1313    CHEMISTRY No results for input(s): NA, K, CL, CO2, GLUCOSE, BUN, CREATININE, CALCIUM, MG, PHOS in the last 168 hours. CrCl cannot be calculated (Patient's most recent lab result is older than the maximum 21 days allowed.).   Chest Imaging- films reviewed: CT chest 03/01/2017- moderate airway thickening, likely mild bronchiectasis in lower lobes.  No significant emphysema.  No mediastinal or hilar adenopathy.  Patulous esophagus.  01/30/19 bilateral lower extremity ultrasound-no DVT. Baker's cyst on the right.  Pulmonary Functions Testing Results: No flowsheet data found.  Per [facility] records 2018: spirometry 01/2013 FEV1/FVC equals 64%, FEV1 equals 72% mild COPD   Echocardiogram 01/10/2018: LVEF 65 to 70%.  Grade 1 diastolic dysfunction, elevated LVEDP.  Mild AS.  LA, RV, RA.  Trivial MR and , otherwise normal valves.  NM SPECT 01/10/2018: LVEF 67%, hyperdynamic function.  Abnormal baseline EKG, no ischemic changes during test.  Normal myocardial perfusion; low risk study.    Assessment & Plan:     ICD-10-CM   1. Chronic  obstructive pulmonary disease, unspecified COPD type ([place])  J44.9   2. Chronic sinusitis, unspecified location  J32.9   3. Asthma-COPD overlap syndrome (HCC)  J44.9     COPD-uncontrolled.  Question if he has an asthma overlap. 2 mild AE in the past year. GOLD D. -Continue triple inhaled therapy-continue Symbicort.  Stop Spiriva.  Trial of Tudorza twice daily.  Continue to rinse his mouth after every use of Symbicort.   -Combivent every 4 hours as needed -Continue montelukast and nasal sprays -Flutter valve -Agree with completing COVID-19 vaccine series.  Continue Covid precautions-social distancing, mask wearing, handwashing.  -PFTs  Allergic rhinosinusitis -Continue azelastine nasal spray-1 puff twice daily -Continue intranasal steroids once daily -Continue Singulair.no oral antihistamines due to urinary retention.  -Recommend stopping Sudafed and Afrin, which he is reluctant to do.  He is requested a prescription due to difficulty getting Sudafed from the pharmacy requiring him to go out of the house.  He is worried about Covid exposures.    RTC in 2 months.    Current Outpatient Medications:    aspirin 81 MG tablet, Take 81 mg by mouth daily. , Disp: , Rfl:    azelastine (ASTELIN) 0.1 % nasal spray, Place 2 sprays into both nostrils 2 (two) times daily. Use in each nostril as directed, Disp: 30 mL, Rfl: 12   budesonide-formoterol (SYMBICORT) 160-4.5 MCG/ACT inhaler, Inhale 2 puffs into the lungs 2 (two) times daily., Disp: 1 Inhaler, Rfl: 5   diazepam (VALIUM) 2 MG tablet, Take 2 mg by mouth 2 (two) times daily as needed for anxiety. Half tablet twice a day as needed, Disp: , Rfl:    diphenhydrAMINE (BENADRYL) 25 mg capsule, Take 25 mg by mouth at bedtime as needed for sleep., Disp: , Rfl:    fluticasone (FLONASE) 50 MCG/ACT nasal spray, Place 2 sprays into both nostrils daily. , Disp: , Rfl:    gabapentin (NEURONTIN) 300 MG capsule, Take 300 mg by mouth 3 (three) times  daily. , Disp: , Rfl:    guaifenesin (HUMIBID E) 400 MG TABS, Take 400 mg by  mouth every 4 (four) hours as needed (for congestion or mucus). , Disp: , Rfl:    hydrochlorothiazide (HYDRODIURIL) 25 MG tablet, Take 25 mg by mouth every morning. , Disp: , Rfl:    ibuprofen (ADVIL,MOTRIN) 200 MG tablet, Take 600 mg by mouth every 6 (six) hours as needed for headache or moderate pain. , Disp: , Rfl:    Ipratropium-Albuterol (COMBIVENT RESPIMAT) 20-100 MCG/ACT AERS respimat, Inhale 1 puff into the lungs every 6 (six) hours., Disp: 4 g, Rfl: 11   latanoprost (XALATAN) 0.005 % ophthalmic solution, Place 1 drop into both eyes at bedtime. , Disp: , Rfl:    magnesium hydroxide (MILK OF MAGNESIA) 400 MG/5ML suspension, Take 15 mLs by mouth at bedtime. , Disp: , Rfl:    metoprolol succinate (TOPROL-XL) 100 MG 24 hr tablet, Take 100 mg by mouth at bedtime. Take with or immediately following a meal., Disp: , Rfl:    montelukast (SINGULAIR) 10 MG tablet, Take 1 tablet (10 mg total) by mouth at bedtime., Disp: 30 tablet, Rfl: 11   Multiple Vitamins-Minerals (MULTIVITAMIN ADULT PO), Take 1 tablet by mouth daily. , Disp: , Rfl:    omeprazole (PRILOSEC) 40 MG capsule, Take 40 mg by mouth every morning., Disp: , Rfl:    OXYCODONE ER PO, Take 10 mg by mouth every 5 (five) hours as needed., Disp: , Rfl:    oxymetazoline (AFRIN) 0.05 % nasal spray, Place 2 sprays into both nostrils 2 (two) times daily as needed for congestion., Disp: , Rfl:    Polyvinyl Alcohol-Povidone PF (REFRESH) 1.4-0.6 % SOLN, Place 2 drops into both eyes 2 (two) times daily as needed (for dry eyes)., Disp: , Rfl:    pseudoephedrine (SUDAFED) 30 MG tablet, Take 60 mg by mouth 2 (two) times daily as needed for congestion. , Disp: , Rfl:    ramipril (ALTACE) 10 MG capsule, Take 10 mg by mouth every morning. , Disp: , Rfl:    Respiratory Therapy Supplies (FLUTTER) DEVI, 1 each by Does not apply route daily., Disp: 1 each, Rfl: 0    Simethicone (GAS-X EXTRA STRENGTH) 125 MG CAPS, Take 125 mg by mouth 4 (four) times daily as needed (for gas). , Disp: , Rfl:    simvastatin (ZOCOR) 20 MG tablet, Take 20 mg by mouth at bedtime. , Disp: , Rfl:    spironolactone (ALDACTONE) 25 MG tablet, Take 12.5 mg by mouth at bedtime. , Disp: , Rfl:    Tamsulosin HCl (FLOMAX) 0.4 MG CAPS, Take 0.4 mg by mouth 2 (two) times daily., Disp: , Rfl:    Aclidinium Bromide (TUDORZA PRESSAIR) 400 MCG/ACT AEPB, Inhale 1 puff into the lungs 2 (two) times daily., Disp: 1 each, Rfl: 11   Ascorbic Acid (VITAMIN C) 100 MG tablet, Take 1,000 mg by mouth daily. , Disp: , Rfl:    cholecalciferol (VITAMIN D3) 25 MCG (1000 UNIT) tablet, Take 1,000 Units by mouth daily., Disp: , Rfl:    pseudoephedrine (SUDAFED) 60 MG tablet, Take 1 tablet (60 mg total) by mouth 2 (two) times daily as needed for congestion., Disp: 30 tablet, Rfl: 1    [name], DO [facility] 02/24/2019 3:40 PM

## 2019-02-24 NOTE — Patient Instructions (Addendum)
Thank you for visiting Dr. Carlis Abbott at [facility]. We recommend the following: No orders of the defined types were placed in this encounter.  No orders of the defined types were placed in this encounter.   Meds ordered this encounter  Medications  . Aclidinium Bromide (TUDORZA PRESSAIR) 400 MCG/ACT AEPB    Sig: Inhale 1 puff into the lungs 2 (two) times daily.    Dispense:  1 each    Refill:  11  . pseudoephedrine (SUDAFED) 60 MG tablet    Sig: Take 1 tablet (60 mg total) by mouth 2 (two) times daily as needed for congestion.    Dispense:  30 tablet    Refill:  1  . pseudoephedrine (SUDAFED) 60 MG tablet    Sig: Take 1 tablet (60 mg total) by mouth 2 (two) times daily as needed for congestion.    Dispense:  60 tablet    Refill:  1    Return in about 2 months (around 04/24/2019).    Please do your part to reduce the spread of COVID-19.

## 2019-02-25 ENCOUNTER — Telehealth: Payer: Self-pay | Admitting: Critical Care Medicine

## 2019-02-25 NOTE — Telephone Encounter (Signed)
Patient is returning phone call.  Patient phone number is [phone].

## 2019-02-25 NOTE — Telephone Encounter (Signed)
We can do Anoro plus Pulmicort. We can stop symbicort and spiriva if we do this combination instead. Thanks!  LPC

## 2019-02-25 NOTE — Telephone Encounter (Signed)
LMTCB

## 2019-02-25 NOTE — Telephone Encounter (Signed)
Pulmicort 180, 1 puff BID  Thanks!

## 2019-02-25 NOTE — Telephone Encounter (Signed)
OK- pulmicort 90 or 180 (I think it's 1 puff bid right?)

## 2019-02-25 NOTE — Telephone Encounter (Signed)
Spoke with the pt  He states that he called insurance and got a list of preferred meds:  Anoro, Breo, Bevespi, Duoneb, Pulmicort inhaler, Serevent Diskus, Fluticasone-Salmeterol, and Atrovent HFA   Please advise if any of these would be okay to take the place of symbciort and tudorza thanks

## 2019-02-25 NOTE — Telephone Encounter (Signed)
Called and spoke to pt's wife, June. She states the pt's Caprice Renshaw is also not covered by insurance. Called pharmacy and verified this. Pt states he was advised that if the Tudorza isnt covered then Dr. Carlis Abbott wanted pt on Trelegy (needs a PA).   Dr. Carlis Abbott please advise if you would like [place] to try and do a PA for Trelegy, to take in place of Symbicort and [place]. Thanks.

## 2019-02-26 MED ORDER — PULMICORT FLEXHALER 180 MCG/ACT IN AEPB: 1.0000 | INHALATION_SPRAY | Freq: Two times a day (BID) | RESPIRATORY_TRACT | 5 refills | Status: DC

## 2019-02-26 MED ORDER — ANORO ELLIPTA 62.5-25 MCG/INH IN AEPB: 1.0000 | INHALATION_SPRAY | Freq: Every day | RESPIRATORY_TRACT | 5 refills | Status: DC

## 2019-02-26 NOTE — Telephone Encounter (Signed)
Called and spoke to pt. Informed him of the recs per Dr. Carlis Abbott. Orders placed to preferred pharmacy. Pt aware to have pharmacist show pt how to use inhalers and to call us back with any questions or concerns. Nothing further needed at this time.

## 2019-03-16 DIAGNOSIS — H353231 Exudative age-related macular degeneration, bilateral, with active choroidal neovascularization: Secondary | ICD-10-CM | POA: Diagnosis not present

## 2019-03-16 DIAGNOSIS — H353211 Exudative age-related macular degeneration, right eye, with active choroidal neovascularization: Secondary | ICD-10-CM | POA: Diagnosis not present

## 2019-03-17 DIAGNOSIS — I10 Essential (primary) hypertension: Secondary | ICD-10-CM | POA: Diagnosis not present

## 2019-03-17 DIAGNOSIS — J449 Chronic obstructive pulmonary disease, unspecified: Secondary | ICD-10-CM | POA: Diagnosis not present

## 2019-03-17 DIAGNOSIS — E7800 Pure hypercholesterolemia, unspecified: Secondary | ICD-10-CM | POA: Diagnosis not present

## 2019-03-17 DIAGNOSIS — Z79899 Other long term (current) drug therapy: Secondary | ICD-10-CM | POA: Diagnosis not present

## 2019-03-17 DIAGNOSIS — Z0000 Encounter for general adult medical examination without abnormal findings: Secondary | ICD-10-CM | POA: Diagnosis not present

## 2019-03-17 DIAGNOSIS — R718 Other abnormality of red blood cells: Secondary | ICD-10-CM | POA: Diagnosis not present

## 2019-03-17 DIAGNOSIS — G894 Chronic pain syndrome: Secondary | ICD-10-CM | POA: Diagnosis not present

## 2019-03-17 DIAGNOSIS — I700 Atherosclerosis of aorta: Secondary | ICD-10-CM | POA: Diagnosis not present

## 2019-03-17 DIAGNOSIS — M5441 Lumbago with sciatica, right side: Secondary | ICD-10-CM | POA: Diagnosis not present

## 2019-03-17 DIAGNOSIS — Z1389 Encounter for screening for other disorder: Secondary | ICD-10-CM | POA: Diagnosis not present

## 2019-03-17 DIAGNOSIS — Z125 Encounter for screening for malignant neoplasm of prostate: Secondary | ICD-10-CM | POA: Diagnosis not present

## 2019-03-17 DIAGNOSIS — Z6835 Body mass index (BMI) 35.0-35.9, adult: Secondary | ICD-10-CM | POA: Diagnosis not present

## 2019-03-17 DIAGNOSIS — K219 Gastro-esophageal reflux disease without esophagitis: Secondary | ICD-10-CM | POA: Diagnosis not present

## 2019-03-17 DIAGNOSIS — K909 Intestinal malabsorption, unspecified: Secondary | ICD-10-CM | POA: Diagnosis not present

## 2019-03-17 DIAGNOSIS — K912 Postsurgical malabsorption, not elsewhere classified: Secondary | ICD-10-CM | POA: Diagnosis not present

## 2019-03-19 DIAGNOSIS — E7800 Pure hypercholesterolemia, unspecified: Secondary | ICD-10-CM | POA: Diagnosis not present

## 2019-03-19 DIAGNOSIS — J449 Chronic obstructive pulmonary disease, unspecified: Secondary | ICD-10-CM | POA: Diagnosis not present

## 2019-03-19 DIAGNOSIS — I10 Essential (primary) hypertension: Secondary | ICD-10-CM | POA: Diagnosis not present

## 2019-04-10 DIAGNOSIS — H353221 Exudative age-related macular degeneration, left eye, with active choroidal neovascularization: Secondary | ICD-10-CM | POA: Diagnosis not present

## 2019-04-10 DIAGNOSIS — H401331 Pigmentary glaucoma, bilateral, mild stage: Secondary | ICD-10-CM | POA: Diagnosis not present

## 2019-04-13 DIAGNOSIS — H401132 Primary open-angle glaucoma, bilateral, moderate stage: Secondary | ICD-10-CM | POA: Diagnosis not present

## 2019-04-13 DIAGNOSIS — H353231 Exudative age-related macular degeneration, bilateral, with active choroidal neovascularization: Secondary | ICD-10-CM | POA: Diagnosis not present

## 2019-04-21 ENCOUNTER — Encounter: Payer: Self-pay | Admitting: Critical Care Medicine

## 2019-04-21 ENCOUNTER — Ambulatory Visit (INDEPENDENT_AMBULATORY_CARE_PROVIDER_SITE_OTHER): Payer: Medicare Other | Admitting: Critical Care Medicine

## 2019-04-21 ENCOUNTER — Other Ambulatory Visit: Payer: Self-pay

## 2019-04-21 VITALS — BP 142/72 | HR 92 | Temp 98.3°F | Ht 66.0 in | Wt 228.0 lb

## 2019-04-21 DIAGNOSIS — J449 Chronic obstructive pulmonary disease, unspecified: Secondary | ICD-10-CM | POA: Diagnosis not present

## 2019-04-21 DIAGNOSIS — J329 Chronic sinusitis, unspecified: Secondary | ICD-10-CM | POA: Diagnosis not present

## 2019-04-21 MED ORDER — PULMICORT FLEXHALER 180 MCG/ACT IN AEPB: 2.0000 | INHALATION_SPRAY | Freq: Two times a day (BID) | RESPIRATORY_TRACT | 11 refills | Status: DC

## 2019-04-21 MED ORDER — PANTOPRAZOLE SODIUM 40 MG PO TBEC: 40.0000 mg | DELAYED_RELEASE_TABLET | Freq: Every day | ORAL | 11 refills | Status: DC

## 2019-04-21 NOTE — Patient Instructions (Addendum)
Thank you for visiting Dr. Carlis Abbott at [facility]. We recommend the following:  Switch omeprazole to pantoprazole (Protonix) once daily- 30 minutes before breakfast.  Keep using Anoro once daily.  Meds ordered this encounter  Medications  . budesonide (PULMICORT FLEXHALER) 180 MCG/ACT inhaler    Sig: Inhale 2 puffs into the lungs in the morning and at bedtime.    Dispense:  1 each    Refill:  11  . pantoprazole (PROTONIX) 40 MG tablet    Sig: Take 1 tablet (40 mg total) by mouth daily.    Dispense:  30 tablet    Refill:  11    Return in about 6 months (around 10/21/2019).    Please do your part to reduce the spread of COVID-19.

## 2019-04-21 NOTE — Progress Notes (Signed)
Synopsis: Referred in January 2021 for CODP by [name], MD.  Subjective:   PATIENT ID: Cody Tate GENDER: male DOB: 1950-04-07, MRN: [id number]  Chief Complaint  Patient presents with  . Follow-up    Patient is feeling better since last visit. Patient is doing Anoro and Pulmicort 1 puff BID. Patient feels like symbicort helped him more than pulmicort. Denies cough, wheezing.     Cody Tate is a 69 year old gentleman with a history of COPD who presents for follow-up.  He is accompanied by his daughter today.  He has been doing well since his last visit.  He was switched from Symbicort to Anoro plus Pulmicort.  His shortness of breath is better and his coughing and wheezing resolved.  He is able to walk around his house more easily, but his chronic leg and back pain continue to limit his mobility.  He ambulates with a walker.  He has noticed his breathing has been slightly worse since the pollen has been out, but he is doing well overall.  He has continued Flonase and montelukast, but had to stop azelastine due to nasal dryness.  He takes Benadryl at night before bed.  He has chronic leg edema, left greater than right that has been ongoing since spine surgery many years ago.  He has significant GERD with reflux symptoms several days per week despite daily omeprazole.  He was previously on Nexium with better control, but his insurance is no longer covering it.    OV 02/24/19: Cody Tate is a 69 year old gentleman with a history of COPD and asthma overlap.  At his last visit he was started on Symbicort.  Although he had side effects to Spiriva, due to insurance coverage he was not able to switch this to Trelegy.  Spiriva causes hoarseness and throat irritation causing dysphagia.  No odynophagia.  He started Singulair at his last visit.  He continues to use Combivent as his rescue inhaler.  Since his last visit his breathing is significantly improved, he seldom wheezes, he is sleeping better,  and his endurance is better.  He has chronic sinusitis for which he takes Afrin, Singulair, Flonase, azelastine, and Sudafed.  He is unable to take antihistamines due to urinary retention.  Azelastine was causing excessive nasal dryness, and he is decreased his dose to once daily dosing.  He is reluctant to stop Sudafed and Afrin due to rebound congestion.   OV 01/29/19: Cody Tate is a 69 year old gentleman who presents for evaluation of severe dyspnea on exertion and wheezing have been worsening for at least the past year.  He has chest congestion with a cough productive of thick yellow sputum.  He has trouble clearing sputum, but albuterol helps.  He was diagnosed with COPD on spirometry several years ago, and has been on Spiriva once daily.  He is using albuterol multiple times a day, more than every 4 hours.  He is unsure if Spiriva is helping or making his symptoms worse; he sometimes feels more short of breath after using it.  He was treated with prednisone in August and doxycycline in December for acute exacerbation; the doxycycline in December did not improve his symptoms.  He is able to walk less than 1 block on flat ground.  He is able to walk around his house, but is unable to both walk and talk at the same time.  He denies fever, chills, sweats.  He has chronic GERD, which is well controlled on omeprazole and dietary modifications.  He has chronic sinus issues with severe postnasal drip.  He has been taking Sudafed, Mucinex, and using Afrin nasal spray, which she has been on for several years.  He is using steroid nasal spray, which only helps for a few hours.  He has chronic aortic stenosis, and has a follow-up echocardiogram tomorrow.  He has noticed more lower extremity edema recently, especially on the left.  He still working on cutting down his sodium, but is diuretics were not increased.  He has gained 10 to 12 pounds recently, which he thinks is more due to diet than edema.  He quit smoking  in 2002 after 35 years x 1.5 packs/day.  He has never felt like his breathing was normal.  In elementary school he was not able to keep up with the other kids.  He always had shortness of breath when he was doing his physical training during his [employer].  He was born full-term infant as far as he knows.  He had an uncle with COPD, but otherwise there is no family history of lung disease.   Past Medical History:  Diagnosis Date  . Arthritis   . BPH (benign prostatic hyperplasia)   . COPD (chronic obstructive pulmonary disease) ([place])   . Enlarged prostate   . GERD (gastroesophageal reflux disease)   . Glaucoma   . H/O hiatal hernia   . H/O wheezing    occ inhaler usage  . Heart murmur   . Hypertension   . Meningioma ([place])   . Pulmonary nodules    resolved on 2019 follow up CT     Family History  Problem Relation Age of Onset  . COPD Maternal Uncle      Past Surgical History:  Procedure Laterality Date  . BACK SURGERY  06,07,08   lam x3 cerv x 1  . CERVICAL FUSION    . CYSTOSCOPY     69 yrs old  . EYE SURGERY     bil  . KNEE ARTHROSCOPY  10   lft  . KNEE ARTHROSCOPY Left 07/23/2012   Procedure: LEFT MEDIAL MENISCAL DEBRIDEMENT AND CHONDROPLASTY;  Surgeon: [name], MD;  Location: [facility];  Service: Orthopedics;  Laterality: Left;  . LUMBAR FUSION  02/22/2014   DR POOL  . PILONIDAL CYST EXCISION    . SINUS EXPLORATION    . SPINAL CORD STIMULATOR INSERTION N/A 01/16/2018   Procedure: LUMBAR SPINAL CORD STIMULATOR INSERTION;  Surgeon: [name], MD;  Location: [facility];  Service: Neurosurgery;  Laterality: N/A;  LUMBAR SPINAL CORD STIMULATOR INSERTION  . WRIST GANGLION EXCISION     rt  . WRIST SURGERY  09   cyst lft    Social History   Socioeconomic History  . Marital status: Married    Spouse name: Not on file  . Number of children: Not on file  . Years of education: Not on file  . Highest education level: Not on file  Occupational History  . Not  on file  Tobacco Use  . Smoking status: Former Smoker    Packs/day: 1.50    Years: 35.00    Pack years: 52.50    Types: Cigarettes    Quit date: 05/03/2000    Years since quitting: 18.9  . Smokeless tobacco: Never Used  . Tobacco comment: quit alcohol 73  Substance and Sexual Activity  . Alcohol use: No  . Drug use: No  . Sexual activity: Not Currently  Other Topics Concern  . Not  on file  Social History Narrative   08-20-17 Unable to ask abuse questions wife with him today.   Social Determinants of Health   Financial Resource Strain:   . Difficulty of Paying Living Expenses:   Food Insecurity:   . Worried About Charity fundraiser in the Last Year:   . Arboriculturist in the Last Year:   Transportation Needs:   . Film/video editor (Medical):   Marland Kitchen Lack of Transportation (Non-Medical):   Physical Activity:   . Days of Exercise per Week:   . Minutes of Exercise per Session:   Stress:   . Feeling of Stress :   Social Connections:   . Frequency of Communication with Friends and Family:   . Frequency of Social Gatherings with Friends and Family:   . Attends Religious Services:   . Active Member of Clubs or Organizations:   . Attends Archivist Meetings:   Marland Kitchen Marital Status:   Intimate Partner Violence:   . Fear of Current or Ex-Partner:   . Emotionally Abused:   Marland Kitchen Physically Abused:   . Sexually Abused:      Allergies  Allergen Reactions  . Adhesive [Tape] Itching, Rash and Other (See Comments)    Occlusive tape and bandaids  . Other Itching, Rash and Other (See Comments)    VICRYL Sutures     Immunization History  Administered Date(s) Administered  . DTP 06/01/2001  . Influenza Split 10/16/2010, 10/08/2012, 10/24/2012, 10/20/2013  . Influenza, High Dose Seasonal PF 10/22/2014, 09/07/2015  . Influenza-Unspecified 10/03/2018  . PFIZER SARS-COV-2 Vaccination 02/13/2019, 03/10/2019  . Pneumococcal Conjugate-13 10/22/2014  . Pneumococcal  Polysaccharide-23 02/10/2015  . Tdap 09/18/2011, 05/17/2012  . Zoster 08/04/2010  . Zoster Recombinat (Shingrix) 08/18/2018    Outpatient Medications Prior to Visit  Medication Sig Dispense Refill  . aspirin 81 MG tablet Take 81 mg by mouth daily.     Marland Kitchen azelastine (ASTELIN) 0.1 % nasal spray Place 2 sprays into both nostrils 2 (two) times daily. Use in each nostril as directed 30 mL 12  . budesonide (PULMICORT FLEXHALER) 180 MCG/ACT inhaler Inhale 1 puff into the lungs in the morning and at bedtime. 1 each 5  . diazepam (VALIUM) 2 MG tablet Take 2 mg by mouth 2 (two) times daily as needed for anxiety. Half tablet twice a day as needed    . diphenhydrAMINE (BENADRYL) 25 mg capsule Take 25 mg by mouth at bedtime as needed for sleep.    . fluticasone (FLONASE) 50 MCG/ACT nasal spray Place 2 sprays into both nostrils daily.     Marland Kitchen gabapentin (NEURONTIN) 300 MG capsule Take 300 mg by mouth 3 (three) times daily.     Marland Kitchen guaifenesin (HUMIBID E) 400 MG TABS Take 400 mg by mouth every 4 (four) hours as needed (for congestion or mucus).     . hydrochlorothiazide (HYDRODIURIL) 25 MG tablet Take 25 mg by mouth every morning.     Marland Kitchen ibuprofen (ADVIL,MOTRIN) 200 MG tablet Take 600 mg by mouth every 6 (six) hours as needed for headache or moderate pain.     . Ipratropium-Albuterol (COMBIVENT RESPIMAT) 20-100 MCG/ACT AERS respimat Inhale 1 puff into the lungs every 6 (six) hours. 4 g 11  . Iron, Ferrous Sulfate, 325 (65 Fe) MG TABS Take 1 tablet by mouth every other day. Monday, Wednesday and Friday    . latanoprost (XALATAN) 0.005 % ophthalmic solution Place 1 drop into both eyes at bedtime.     Marland Kitchen  magnesium hydroxide (MILK OF MAGNESIA) 400 MG/5ML suspension Take 15 mLs by mouth at bedtime.     . metoprolol succinate (TOPROL-XL) 100 MG 24 hr tablet Take 100 mg by mouth at bedtime. Take with or immediately following a meal.    . montelukast (SINGULAIR) 10 MG tablet Take 1 tablet (10 mg total) by mouth at bedtime.  30 tablet 11  . Multiple Vitamins-Minerals (MULTIVITAMIN ADULT PO) Take 1 tablet by mouth daily.     Marland Kitchen omeprazole (PRILOSEC) 40 MG capsule Take 40 mg by mouth every morning.    . OXYCODONE ER PO Take 10 mg by mouth every 5 (five) hours as needed.    Marland Kitchen oxymetazoline (AFRIN) 0.05 % nasal spray Place 2 sprays into both nostrils 2 (two) times daily as needed for congestion.    . Polyvinyl Alcohol-Povidone PF (REFRESH) 1.4-0.6 % SOLN Place 2 drops into both eyes 2 (two) times daily as needed (for dry eyes).    . ramipril (ALTACE) 10 MG capsule Take 10 mg by mouth every morning.     Marland Kitchen Respiratory Therapy Supplies (FLUTTER) DEVI 1 each by Does not apply route daily. 1 each 0  . Simethicone (GAS-X EXTRA STRENGTH) 125 MG CAPS Take 125 mg by mouth 4 (four) times daily as needed (for gas).     . simvastatin (ZOCOR) 20 MG tablet Take 20 mg by mouth at bedtime.     Marland Kitchen spironolactone (ALDACTONE) 25 MG tablet Take 12.5 mg by mouth at bedtime.     . Tamsulosin HCl (FLOMAX) 0.4 MG CAPS Take 0.4 mg by mouth 2 (two) times daily.    Marland Kitchen umeclidinium-vilanterol (ANORO ELLIPTA) 62.5-25 MCG/INH AEPB Inhale 1 puff into the lungs daily. 60 each 5  . Ascorbic Acid (VITAMIN C) 100 MG tablet Take 1,000 mg by mouth daily.     . cholecalciferol (VITAMIN D3) 25 MCG (1000 UNIT) tablet Take 1,000 Units by mouth daily.    . pseudoephedrine (SUDAFED) 60 MG tablet Take 1 tablet (60 mg total) by mouth 2 (two) times daily as needed for congestion. 30 tablet 1  . pseudoephedrine (SUDAFED) 60 MG tablet Take 1 tablet (60 mg total) by mouth 2 (two) times daily as needed for congestion. 60 tablet 1   No facility-administered medications prior to visit.    Review of Systems  Constitutional: Negative for chills, fever and weight loss.  HENT: Positive for congestion.        Postnasal drip  Respiratory: Positive for cough, shortness of breath and wheezing.   Cardiovascular: Positive for leg swelling. Negative for chest pain.    Gastrointestinal: Positive for heartburn.  Genitourinary:       Prostate issues/urinary retention  Neurological:       Balance issues     Objective:   Vitals:   04/21/19 1207  BP: (!) 142/72  Pulse: 92  Temp: 98.3 F (36.8 C)  TempSrc: Temporal  SpO2: 96%  Weight: 228 lb (103.4 kg)  Height: 5\' 6"  (1.676 m)   96% on   RA BMI Readings from Last 3 Encounters:  04/21/19 36.80 kg/m  02/24/19 36.35 kg/m  01/29/19 36.61 kg/m   Wt Readings from Last 3 Encounters:  04/21/19 228 lb (103.4 kg)  02/24/19 225 lb 3.2 oz (102.2 kg)  01/29/19 226 lb 12.8 oz (102.9 kg)    Physical Exam   CBC    Component Value Date/Time   WBC 6.7 01/02/2018 1450   RBC 5.29 01/02/2018 1450   HGB 13.5 01/02/2018 1450  HCT 43.7 01/02/2018 1450   PLT 318 01/02/2018 1450   MCV 82.6 01/02/2018 1450   MCH 25.5 (L) 01/02/2018 1450   MCHC 30.9 01/02/2018 1450   RDW 13.7 01/02/2018 1450   LYMPHSABS 1.3 02/12/2014 1313   MONOABS 0.7 02/12/2014 1313   EOSABS 0.1 02/12/2014 1313   BASOSABS 0.1 02/12/2014 1313    CHEMISTRY No results for input(s): NA, K, CL, CO2, GLUCOSE, BUN, CREATININE, CALCIUM, MG, PHOS in the last 168 hours. CrCl cannot be calculated (Patient's most recent lab result is older than the maximum 21 days allowed.).   Chest Imaging- films reviewed: CT chest 03/01/2017- moderate airway thickening, likely mild bronchiectasis in lower lobes.  No significant emphysema.  No mediastinal or hilar adenopathy.  Patulous esophagus.  01/30/19 bilateral lower extremity ultrasound-no DVT. Baker's cyst on the right.  Pulmonary Functions Testing Results: No flowsheet data found.  Per [facility] records 2018: spirometry 01/2013 FEV1/FVC equals 64%, FEV1 equals 72% mild COPD  Spirometry 2015: FVC 3.47 (83%)--> 3.59 (86%, +3%) FEV1 2.24 (72%)--> 2.37 (76%, +6%)  Ratio 65 Flow volume loop supports obstruction  Echocardiogram 01/10/2018: LVEF 65 to 70%.  Grade 1 diastolic dysfunction, elevated  LVEDP.  Mild AS.  LA, RV, RA.  Trivial MR and TR, otherwise normal valves.  NM SPECT 01/10/2018: LVEF 67%, hyperdynamic function.  Abnormal baseline EKG, no ischemic changes during test.  Normal myocardial perfusion; low risk study.    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 Chronic sinusitis, unspecified location  J32.9     COPD-  Question if he has an asthma overlap. 2 mild AE in the past year. GOLD D. -Continue triple inhaled therapy-Anoro plus Pulmicort.  Due to concern for possible asthma overlap, increase Pulmicort to 2 puffs twice daily.  He should continue to rinse his mouth after every use. -Continue Combivent rescue inhaler every 4 hours as needed -Continue allergic rhinosinusitis management with montelukast and Flonase nasal spray.  Okay to discontinue azelastine given nasal dryness -Up-to-date on Covid, flu, pneumonia vaccines -Continue Covid precautions as he is high risk for deleterious outcomes if he were to contract Covid  Allergic rhinosinusitis -Continue Flonase bilaterally once daily  -Continue Singulair once daily.  No oral antihistamines due to urinary retention. -He continues to use Afrin, which he understands will cause rebound symptoms.  RTC in  6  months.    Current Outpatient Medications:  .  aspirin 81 MG tablet, Take 81 mg by mouth daily. , Disp: , Rfl:  .  azelastine (ASTELIN) 0.1 % nasal spray, Place 2 sprays into both nostrils 2 (two) times daily. Use in each nostril as directed, Disp: 30 mL, Rfl: 12 .  budesonide (PULMICORT FLEXHALER) 180 MCG/ACT inhaler, Inhale 1 puff into the lungs in the morning and at bedtime., Disp: 1 each, Rfl: 5 .  diazepam (VALIUM) 2 MG tablet, Take 2 mg by mouth 2 (two) times daily as needed for anxiety. Half tablet twice a day as needed, Disp: , Rfl:  .  diphenhydrAMINE (BENADRYL) 25 mg capsule, Take 25 mg by mouth at bedtime as needed for sleep., Disp: , Rfl:  .  fluticasone  (FLONASE) 50 MCG/ACT nasal spray, Place 2 sprays into both nostrils daily. , Disp: , Rfl:  .  gabapentin (NEURONTIN) 300 MG capsule, Take 300 mg by mouth 3 (three) times daily. , Disp: , Rfl:  .  guaifenesin (HUMIBID E) 400 MG TABS, Take 400 mg  by mouth every 4 (four) hours as needed (for congestion or mucus). , Disp: , Rfl:  .  hydrochlorothiazide (HYDRODIURIL) 25 MG tablet, Take 25 mg by mouth every morning. , Disp: , Rfl:  .  ibuprofen (ADVIL,MOTRIN) 200 MG tablet, Take 600 mg by mouth every 6 (six) hours as needed for headache or moderate pain. , Disp: , Rfl:  .  Ipratropium-Albuterol (COMBIVENT RESPIMAT) 20-100 MCG/ACT AERS respimat, Inhale 1 puff into the lungs every 6 (six) hours., Disp: 4 g, Rfl: 11 .  Iron, Ferrous Sulfate, 325 (65 Fe) MG TABS, Take 1 tablet by mouth every other day. Monday, Wednesday and Friday, Disp: , Rfl:  .  latanoprost (XALATAN) 0.005 % ophthalmic solution, Place 1 drop into both eyes at bedtime. , Disp: , Rfl:  .  magnesium hydroxide (MILK OF MAGNESIA) 400 MG/5ML suspension, Take 15 mLs by mouth at bedtime. , Disp: , Rfl:  .  metoprolol succinate (TOPROL-XL) 100 MG 24 hr tablet, Take 100 mg by mouth at bedtime. Take with or immediately following a meal., Disp: , Rfl:  .  montelukast (SINGULAIR) 10 MG tablet, Take 1 tablet (10 mg total) by mouth at bedtime., Disp: 30 tablet, Rfl: 11 .  Multiple Vitamins-Minerals (MULTIVITAMIN ADULT PO), Take 1 tablet by mouth daily. , Disp: , Rfl:  .  omeprazole (PRILOSEC) 40 MG capsule, Take 40 mg by mouth every morning., Disp: , Rfl:  .  OXYCODONE ER PO, Take 10 mg by mouth every 5 (five) hours as needed., Disp: , Rfl:  .  oxymetazoline (AFRIN) 0.05 % nasal spray, Place 2 sprays into both nostrils 2 (two) times daily as needed for congestion., Disp: , Rfl:  .  Polyvinyl Alcohol-Povidone PF (REFRESH) 1.4-0.6 % SOLN, Place 2 drops into both eyes 2 (two) times daily as needed (for dry eyes)., Disp: , Rfl:  .  ramipril (ALTACE) 10 MG  capsule, Take 10 mg by mouth every morning. , Disp: , Rfl:  .  Respiratory Therapy Supplies (FLUTTER) DEVI, 1 each by Does not apply route daily., Disp: 1 each, Rfl: 0 .  Simethicone (GAS-X EXTRA STRENGTH) 125 MG CAPS, Take 125 mg by mouth 4 (four) times daily as needed (for gas). , Disp: , Rfl:  .  simvastatin (ZOCOR) 20 MG tablet, Take 20 mg by mouth at bedtime. , Disp: , Rfl:  .  spironolactone (ALDACTONE) 25 MG tablet, Take 12.5 mg by mouth at bedtime. , Disp: , Rfl:  .  Tamsulosin HCl (FLOMAX) 0.4 MG CAPS, Take 0.4 mg by mouth 2 (two) times daily., Disp: , Rfl:  .  umeclidinium-vilanterol (ANORO ELLIPTA) 62.5-25 MCG/INH AEPB, Inhale 1 puff into the lungs daily., Disp: 60 each, Rfl: 5    [name], DO [facility] 04/21/2019 12:12 PM

## 2019-04-24 DIAGNOSIS — J449 Chronic obstructive pulmonary disease, unspecified: Secondary | ICD-10-CM | POA: Diagnosis not present

## 2019-04-24 DIAGNOSIS — E7800 Pure hypercholesterolemia, unspecified: Secondary | ICD-10-CM | POA: Diagnosis not present

## 2019-04-24 DIAGNOSIS — I10 Essential (primary) hypertension: Secondary | ICD-10-CM | POA: Diagnosis not present

## 2019-05-14 DIAGNOSIS — I10 Essential (primary) hypertension: Secondary | ICD-10-CM | POA: Diagnosis not present

## 2019-05-14 DIAGNOSIS — E7800 Pure hypercholesterolemia, unspecified: Secondary | ICD-10-CM | POA: Diagnosis not present

## 2019-05-14 DIAGNOSIS — N400 Enlarged prostate without lower urinary tract symptoms: Secondary | ICD-10-CM | POA: Diagnosis not present

## 2019-05-14 DIAGNOSIS — J449 Chronic obstructive pulmonary disease, unspecified: Secondary | ICD-10-CM | POA: Diagnosis not present

## 2019-05-18 DIAGNOSIS — H353231 Exudative age-related macular degeneration, bilateral, with active choroidal neovascularization: Secondary | ICD-10-CM | POA: Diagnosis not present

## 2019-07-15 DIAGNOSIS — J449 Chronic obstructive pulmonary disease, unspecified: Secondary | ICD-10-CM | POA: Diagnosis not present

## 2019-07-15 DIAGNOSIS — I10 Essential (primary) hypertension: Secondary | ICD-10-CM | POA: Diagnosis not present

## 2019-07-15 DIAGNOSIS — N400 Enlarged prostate without lower urinary tract symptoms: Secondary | ICD-10-CM | POA: Diagnosis not present

## 2019-07-15 DIAGNOSIS — E7800 Pure hypercholesterolemia, unspecified: Secondary | ICD-10-CM | POA: Diagnosis not present

## 2019-08-03 ENCOUNTER — Other Ambulatory Visit: Payer: Self-pay | Admitting: *Deleted

## 2019-08-03 DIAGNOSIS — D329 Benign neoplasm of meninges, unspecified: Secondary | ICD-10-CM

## 2019-08-10 DIAGNOSIS — H353231 Exudative age-related macular degeneration, bilateral, with active choroidal neovascularization: Secondary | ICD-10-CM | POA: Diagnosis not present

## 2019-08-10 DIAGNOSIS — H401132 Primary open-angle glaucoma, bilateral, moderate stage: Secondary | ICD-10-CM | POA: Diagnosis not present

## 2019-08-11 ENCOUNTER — Other Ambulatory Visit: Payer: Self-pay | Admitting: Critical Care Medicine

## 2019-08-14 ENCOUNTER — Other Ambulatory Visit: Payer: Self-pay | Admitting: Radiation Therapy

## 2019-08-17 ENCOUNTER — Other Ambulatory Visit: Payer: Self-pay | Admitting: *Deleted

## 2019-08-17 DIAGNOSIS — D329 Benign neoplasm of meninges, unspecified: Secondary | ICD-10-CM

## 2019-08-21 ENCOUNTER — Ambulatory Visit (HOSPITAL_COMMUNITY)
Admission: RE | Admit: 2019-08-21 | Discharge: 2019-08-21 | Disposition: A | Discharge status: Home / Self Care | Payer: Medicare Other | Source: Ambulatory Visit | Attending: Internal Medicine | Admitting: Internal Medicine

## 2019-08-21 ENCOUNTER — Other Ambulatory Visit: Payer: Self-pay

## 2019-08-21 ENCOUNTER — Encounter (HOSPITAL_COMMUNITY): Payer: Self-pay

## 2019-08-21 DIAGNOSIS — D329 Benign neoplasm of meninges, unspecified: Secondary | ICD-10-CM | POA: Insufficient documentation

## 2019-08-21 DIAGNOSIS — G9389 Other specified disorders of brain: Secondary | ICD-10-CM | POA: Diagnosis not present

## 2019-08-21 LAB — POCT I-STAT CREATININE: Creatinine, Ser: 1.1 mg/dL (ref 0.61–1.24)

## 2019-08-21 MED ORDER — IOHEXOL 300 MG/ML  SOLN
75.0000 mL | Freq: Once | INTRAMUSCULAR | Status: AC | PRN
  Administered 2019-08-21: 75 mL via INTRAVENOUS

## 2019-08-21 MED ORDER — SODIUM CHLORIDE (PF) 0.9 % IJ SOLN
INTRAMUSCULAR | Status: AC
  Filled 2019-08-21: qty 50

## 2019-08-24 ENCOUNTER — Inpatient Hospital Stay: Payer: Medicare Other | Attending: Internal Medicine

## 2019-08-24 DIAGNOSIS — C700 Malignant neoplasm of cerebral meninges: Secondary | ICD-10-CM | POA: Insufficient documentation

## 2019-08-24 DIAGNOSIS — Z87891 Personal history of nicotine dependence: Secondary | ICD-10-CM | POA: Insufficient documentation

## 2019-08-24 DIAGNOSIS — G8929 Other chronic pain: Secondary | ICD-10-CM | POA: Insufficient documentation

## 2019-08-24 DIAGNOSIS — M549 Dorsalgia, unspecified: Secondary | ICD-10-CM | POA: Insufficient documentation

## 2019-08-28 ENCOUNTER — Other Ambulatory Visit: Payer: Medicare Other

## 2019-08-28 ENCOUNTER — Inpatient Hospital Stay (HOSPITAL_BASED_OUTPATIENT_CLINIC_OR_DEPARTMENT_OTHER): Payer: Medicare Other | Admitting: Internal Medicine

## 2019-08-28 ENCOUNTER — Other Ambulatory Visit: Payer: Self-pay

## 2019-08-28 VITALS — BP 153/74 | HR 79 | Temp 98.1°F | Resp 17 | Ht 66.0 in

## 2019-08-28 DIAGNOSIS — C700 Malignant neoplasm of cerebral meninges: Secondary | ICD-10-CM | POA: Diagnosis not present

## 2019-08-28 DIAGNOSIS — M549 Dorsalgia, unspecified: Secondary | ICD-10-CM | POA: Diagnosis not present

## 2019-08-28 DIAGNOSIS — D329 Benign neoplasm of meninges, unspecified: Secondary | ICD-10-CM | POA: Diagnosis not present

## 2019-08-28 DIAGNOSIS — G8929 Other chronic pain: Secondary | ICD-10-CM | POA: Diagnosis not present

## 2019-08-28 DIAGNOSIS — Z87891 Personal history of nicotine dependence: Secondary | ICD-10-CM | POA: Diagnosis not present

## 2019-08-28 NOTE — Progress Notes (Signed)
[facility] [place], [place] [address] [phone]   Interval Evaluation  Date of Service: 08/28/19 [name] Patient MRN: [id number] Patient DOB: [birth year] Provider: Ventura Sellers, MD  Identifying Statement:  Cody Tate is a 69 y.o. 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 [employer] [address],  [place] [address]  Oncologic History: 02/04/13: SRS to R tentorial meningioma  Interval History:  Cody Tate presents today for meningioma follow up after recent CT head.  Visual changes are static, nothing progressive at this time.  Walking is limited by chronic back pain, presents today in a wheelchair. Otherwise no new or progressive neurologic deficits.  Medications: Current Outpatient Medications on File Prior to Visit  Medication Sig Dispense Refill  . Ascorbic Acid (VITAMIN C) 1000 MG tablet     . aspirin 81 MG tablet Take 81 mg by mouth daily.     . budesonide (PULMICORT FLEXHALER) 180 MCG/ACT inhaler Inhale 2 puffs into the lungs in the morning and at bedtime. 1 each 11  . Cholecalciferol (VITAMIN D3) 50 MCG (2000 UT) capsule     . diphenhydrAMINE (BENADRYL) 25 mg capsule Take 25 mg by mouth at bedtime as needed for sleep.    . fluticasone (FLONASE) 50 MCG/ACT nasal spray Place 2 sprays into both nostrils daily.     Marland Kitchen gabapentin (NEURONTIN) 300 MG capsule Take 300 mg by mouth 3 (three) times daily.     Marland Kitchen guaifenesin (HUMIBID E) 400 MG TABS Take 400 mg by mouth every 4 (four) hours as needed (for congestion or mucus).     . hydrochlorothiazide (HYDRODIURIL) 25 MG tablet Take 25 mg by mouth every morning.     Marland Kitchen ibuprofen (ADVIL,MOTRIN) 200 MG tablet Take 600 mg by mouth every 6 (six) hours as needed for headache or moderate pain.     . Ipratropium-Albuterol (COMBIVENT RESPIMAT) 20-100 MCG/ACT AERS respimat Inhale 1 puff into the lungs every 6  (six) hours. 4 g 11  . Iron, Ferrous Sulfate, 325 (65 Fe) MG TABS Take 1 tablet by mouth every other day. Monday, Wednesday and Friday    . latanoprost (XALATAN) 0.005 % ophthalmic solution Place 1 drop into both eyes at bedtime.     . magnesium hydroxide (MILK OF MAGNESIA) 400 MG/5ML suspension Take 15 mLs by mouth at bedtime.     . metoprolol succinate (TOPROL-XL) 100 MG 24 hr tablet Take 100 mg by mouth at bedtime. Take with or immediately following a meal.    . montelukast (SINGULAIR) 10 MG tablet Take 1 tablet (10 mg total) by mouth at bedtime. 30 tablet 11  . Multiple Vitamins-Minerals (PRESERVISION AREDS 2+MULTI VIT) CAPS     . Oxycodone HCl 10 MG TABS Take 10 mg by mouth every 4 (four) hours as needed.    Marland Kitchen oxymetazoline (AFRIN) 0.05 % nasal spray Place 2 sprays into both nostrils 2 (two) times daily as needed for congestion.    . pantoprazole (PROTONIX) 40 MG tablet Take 1 tablet (40 mg total) by mouth daily. 30 tablet 11  . Polyvinyl Alcohol-Povidone PF (REFRESH) 1.4-0.6 % SOLN Place 2 drops into both eyes 2 (two) times daily as needed (for dry eyes).    . ramipril (ALTACE) 10 MG capsule Take 10 mg by mouth every morning.     Marland Kitchen Respiratory Therapy Supplies (FLUTTER) DEVI 1 each by Does not apply route  daily. 1 each 0  . Simethicone (GAS-X EXTRA STRENGTH) 125 MG CAPS Take 125 mg by mouth 4 (four) times daily as needed (for gas).     . simvastatin (ZOCOR) 20 MG tablet Take 20 mg by mouth at bedtime.     Marland Kitchen spironolactone (ALDACTONE) 25 MG tablet Take 12.5 mg by mouth at bedtime.     . Tamsulosin HCl (FLOMAX) 0.4 MG CAPS Take 0.4 mg by mouth 2 (two) times daily.    Cody Tate 62.5-25 MCG/INH AEPB INHALE 1 PUFF INTO THE LUNGS DAILY 60 each 5  . omeprazole (PRILOSEC) 40 MG capsule Take 40 mg by mouth every morning.    . OXYCODONE ER PO Take 10 mg by mouth every 5 (five) hours as needed.     No current facility-administered medications on file prior to visit.    Allergies:  Allergies   Allergen Reactions  . Adhesive [Tape] Itching, Rash and Other (See Comments)    Occlusive tape and bandaids  . Other Itching, Rash and Other (See Comments)    VICRYL Sutures   Past Medical History:  Past Medical History:  Diagnosis Date  . Arthritis   . BPH (benign prostatic hyperplasia)   . COPD (chronic obstructive pulmonary disease) ([place])   . Enlarged prostate   . GERD (gastroesophageal reflux disease)   . Glaucoma   . H/O hiatal hernia   . H/O wheezing    occ inhaler usage  . Heart murmur   . Hypertension   . Meningioma ([place])   . Pulmonary nodules    resolved on 2019 follow up CT   Past Surgical History:  Past Surgical History:  Procedure Laterality Date  . BACK SURGERY  06,07,08   lam x3 cerv x 1  . CERVICAL FUSION    . CYSTOSCOPY     69 yrs old  . EYE SURGERY     bil  . KNEE ARTHROSCOPY  10   lft  . KNEE ARTHROSCOPY Left 07/23/2012   Procedure: LEFT MEDIAL MENISCAL DEBRIDEMENT AND CHONDROPLASTY;  Surgeon: [name], MD;  Location: [facility];  Service: Orthopedics;  Laterality: Left;  . LUMBAR FUSION  02/22/2014   DR POOL  . PILONIDAL CYST EXCISION    . SINUS EXPLORATION    . SPINAL CORD STIMULATOR INSERTION N/A 01/16/2018   Procedure: LUMBAR SPINAL CORD STIMULATOR INSERTION;  Surgeon: [name], MD;  Location: [facility];  Service: Neurosurgery;  Laterality: N/A;  LUMBAR SPINAL CORD STIMULATOR INSERTION  . WRIST GANGLION EXCISION     rt  . WRIST SURGERY  09   cyst lft   Social History:  Social History   Socioeconomic History  . Marital status: Married    Spouse name: Not on file  . Number of children: Not on file  . Years of education: Not on file  . Highest education level: Not on file  Occupational History  . Not on file  Tobacco Use  . Smoking status: Former Smoker    Packs/day: 1.50    Years: 35.00    Pack years: 52.50    Types: Cigarettes    Quit date: 05/03/2000    Years since quitting: 19.3  . Smokeless tobacco: Never Used  . Tobacco  comment: quit alcohol 73  Vaping Use  . Vaping Use: Never used  Substance and Sexual Activity  . Alcohol use: No  . Drug use: No  . Sexual activity: Not Currently  Other Topics Concern  . Not on file  Social History Narrative   08-20-17 Unable to ask abuse questions wife with him today.   Social Determinants of Health   Financial Resource Strain:   . Difficulty of Paying Living Expenses: Not on file  Food Insecurity:   . Worried About Charity fundraiser in the Last Year: Not on file  . Ran Out of Food in the Last Year: Not on file  Transportation Needs:   . Lack of Transportation (Medical): Not on file  . Lack of Transportation (Non-Medical): Not on file  Physical Activity:   . Days of Exercise per Week: Not on file  . Minutes of Exercise per Session: Not on file  Stress:   . Feeling of Stress : Not on file  Social Connections:   . Frequency of Communication with Friends and Family: Not on file  . Frequency of Social Gatherings with Friends and Family: Not on file  . Attends Religious Services: Not on file  . Active Member of Clubs or Organizations: Not on file  . Attends Archivist Meetings: Not on file  . Marital Status: Not on file  Intimate Partner Violence:   . Fear of Current or Ex-Partner: Not on file  . Emotionally Abused: Not on file  . Physically Abused: Not on file  . Sexually Abused: Not on file   Family History:  Family History  Problem Relation Age of Onset  . COPD Maternal Uncle     Review of Systems: Constitutional: Denies fevers, chills or abnormal weight loss Eyes: Denies blurriness of vision Ears, nose, mouth, throat, and face: Denies mucositis or sore throat Respiratory: Denies cough, dyspnea or wheezes Cardiovascular: Denies palpitation, chest discomfort or lower extremity swelling Gastrointestinal:  Denies nausea, constipation, diarrhea GU: Denies dysuria or incontinence Skin: Denies abnormal skin rashes Neurological: Per  HPI Musculoskeletal: Denies joint pain, back or neck discomfort. No decrease in ROM Behavioral/Psych: Denies anxiety, disturbance in thought content, and mood instability  Physical Exam: Vitals:   08/28/19 1042 08/28/19 1043  BP: (!) 157/61 (!) 153/74  Pulse: 79   Resp: 17   Temp: 98.1 F (36.7 C)   SpO2: 98%    KPS: 70. General: Alert, cooperative, pleasant, in no acute distress Head: Craniotomy scar noted, dry and intact. EENT: No conjunctival injection or scleral icterus. Oral mucosa moist Lungs: Resp effort normal Cardiac: Regular rate and rhythm Abdomen: Soft, non-distended abdomen Skin: No rashes cyanosis or petechiae. Extremities: No clubbing or edema  Neurologic Exam: Mental Status: Awake, alert, attentive to examiner. Oriented to self and environment. Language is fluent with intact comprehension.  Cranial Nerves: Visual acuity is grossly normal. Visual fields are full. Extra-ocular movements intact. No ptosis. Face is symmetric, tongue midline. Motor: Tone and bulk are normal. Power is full in both arms and legs. Reflexes are symmetric, no pathologic reflexes present. Intact finger to nose bilaterally Sensory: Intact to light touch and temperature Gait: Limited by pain  Labs: I have reviewed the data as listed    Component Value Date/Time   NA 136 01/02/2018 1450   K 4.4 01/02/2018 1450   CL 100 01/02/2018 1450   CO2 25 01/02/2018 1450   GLUCOSE 108 (H) 01/02/2018 1450   BUN 19 01/02/2018 1450   BUN 22.8 08/05/2014 1203   CREATININE 1.10 08/21/2019 1428   CREATININE 1.0 08/05/2014 1203   CALCIUM 9.1 01/02/2018 1450   GFRNONAA >60 01/02/2018 1450   GFRAA >60 01/02/2018 1450   Lab Results  Component Value Date  WBC 6.7 01/02/2018   NEUTROABS 4.4 02/12/2014   HGB 13.5 01/02/2018   HCT 43.7 01/02/2018   MCV 82.6 01/02/2018   PLT 318 01/02/2018    Imaging: [facility] Clinician Interpretation: I have personally reviewed the CNS images as listed.  My  interpretation, in the context of the patient's clinical presentation, is stable disease  CT Head W Wo Contrast  Result Date: 08/21/2019 CLINICAL DATA:  Follow-up meningioma. EXAM: CT HEAD WITHOUT AND WITH CONTRAST TECHNIQUE: Contiguous axial images were obtained from the base of the skull through the vertex without and with intravenous contrast CONTRAST:  38mL OMNIPAQUE IOHEXOL 300 MG/ML  SOLN COMPARISON:  08/22/2018 FINDINGS: Brain: Brain itself shows mild age related volume loss without evidence of acute or subacute infarction, intra-axial mass lesion, hemorrhage, hydrocephalus or extra-axial collection. No change in a calcified meningioma along the anterior tentorium on the right, measuring up to 12 x 14 x 8 mm in size. Vascular: Major vessels at the base of the brain show flow. Skull: Negative Sinuses/Orbits: Clear/normal Other: None IMPRESSION: No change in a calcified meningioma along the anterior tentorium on the right. This has been stable for quite some time, at least going back as far as 2016. It is smaller than was seen in 2014. Electronically Signed   By: Nelson Chimes M.D.   On: 08/21/2019 22:01    Assessment/Plan Meningioma ([place]) [D32.9]  We appreciate the opportunity to participate in the care of THOMSON HERBERS.   He is clinically and radiographically stable today. Visual issues are not related to visual pathway dysfunction and likely ophthalmologic in nature.  He will continue to follow with [facility] for management of chronic back pain and spondylosis s/p SCS placement.  We ask that TYJUAN DEMETRO return to clinic in 18 months following next CNS imaging, or sooner as needed.  I have spent a total of 30 minutes of face-to-face and non-face-to-face time, excluding clinical staff time, preparing to see patient, ordering tests and/or medications, counseling the patient, and independently interpreting results and communicating results to the  patient/family/caregiver    Ventura Sellers, MD Medical Director of Neuro-Oncology [facility] at [place] 08/28/19 11:05 AM

## 2019-08-31 DIAGNOSIS — N400 Enlarged prostate without lower urinary tract symptoms: Secondary | ICD-10-CM | POA: Diagnosis not present

## 2019-08-31 DIAGNOSIS — I10 Essential (primary) hypertension: Secondary | ICD-10-CM | POA: Diagnosis not present

## 2019-08-31 DIAGNOSIS — J449 Chronic obstructive pulmonary disease, unspecified: Secondary | ICD-10-CM | POA: Diagnosis not present

## 2019-08-31 DIAGNOSIS — E7800 Pure hypercholesterolemia, unspecified: Secondary | ICD-10-CM | POA: Diagnosis not present

## 2019-09-16 DIAGNOSIS — D508 Other iron deficiency anemias: Secondary | ICD-10-CM | POA: Diagnosis not present

## 2019-09-16 DIAGNOSIS — M71349 Other bursal cyst, unspecified hand: Secondary | ICD-10-CM | POA: Diagnosis not present

## 2019-09-16 DIAGNOSIS — I10 Essential (primary) hypertension: Secondary | ICD-10-CM | POA: Diagnosis not present

## 2019-09-16 DIAGNOSIS — Z6836 Body mass index (BMI) 36.0-36.9, adult: Secondary | ICD-10-CM | POA: Diagnosis not present

## 2019-09-16 DIAGNOSIS — Z23 Encounter for immunization: Secondary | ICD-10-CM | POA: Diagnosis not present

## 2019-09-21 DIAGNOSIS — H353231 Exudative age-related macular degeneration, bilateral, with active choroidal neovascularization: Secondary | ICD-10-CM | POA: Diagnosis not present

## 2019-09-29 DIAGNOSIS — E7800 Pure hypercholesterolemia, unspecified: Secondary | ICD-10-CM | POA: Diagnosis not present

## 2019-09-29 DIAGNOSIS — I10 Essential (primary) hypertension: Secondary | ICD-10-CM | POA: Diagnosis not present

## 2019-09-29 DIAGNOSIS — D508 Other iron deficiency anemias: Secondary | ICD-10-CM | POA: Diagnosis not present

## 2019-09-29 DIAGNOSIS — N400 Enlarged prostate without lower urinary tract symptoms: Secondary | ICD-10-CM | POA: Diagnosis not present

## 2019-09-29 DIAGNOSIS — J449 Chronic obstructive pulmonary disease, unspecified: Secondary | ICD-10-CM | POA: Diagnosis not present

## 2019-09-30 DIAGNOSIS — I10 Essential (primary) hypertension: Secondary | ICD-10-CM | POA: Diagnosis not present

## 2019-09-30 DIAGNOSIS — I872 Venous insufficiency (chronic) (peripheral): Secondary | ICD-10-CM | POA: Diagnosis not present

## 2019-09-30 DIAGNOSIS — K219 Gastro-esophageal reflux disease without esophagitis: Secondary | ICD-10-CM | POA: Diagnosis not present

## 2019-10-05 DIAGNOSIS — Z23 Encounter for immunization: Secondary | ICD-10-CM | POA: Diagnosis not present

## 2019-10-09 DIAGNOSIS — H401331 Pigmentary glaucoma, bilateral, mild stage: Secondary | ICD-10-CM | POA: Diagnosis not present

## 2019-10-28 ENCOUNTER — Ambulatory Visit (INDEPENDENT_AMBULATORY_CARE_PROVIDER_SITE_OTHER): Payer: Medicare Other | Admitting: Critical Care Medicine

## 2019-10-28 ENCOUNTER — Encounter: Payer: Self-pay | Admitting: Critical Care Medicine

## 2019-10-28 ENCOUNTER — Other Ambulatory Visit: Payer: Self-pay

## 2019-10-28 VITALS — BP 132/70 | HR 73 | Temp 97.3°F | Ht 66.5 in | Wt 218.6 lb

## 2019-10-28 DIAGNOSIS — J309 Allergic rhinitis, unspecified: Secondary | ICD-10-CM

## 2019-10-28 DIAGNOSIS — J449 Chronic obstructive pulmonary disease, unspecified: Secondary | ICD-10-CM | POA: Diagnosis not present

## 2019-10-28 MED ORDER — OMEPRAZOLE 20 MG PO CPDR: 20.0000 mg | DELAYED_RELEASE_CAPSULE | Freq: Every day | ORAL | 3 refills | Status: DC

## 2019-10-28 MED ORDER — ANORO ELLIPTA 62.5-25 MCG/INH IN AEPB: 1.0000 | INHALATION_SPRAY | Freq: Every day | RESPIRATORY_TRACT | 3 refills | Status: DC

## 2019-10-28 MED ORDER — PULMICORT FLEXHALER 180 MCG/ACT IN AEPB: 2.0000 | INHALATION_SPRAY | Freq: Two times a day (BID) | RESPIRATORY_TRACT | 3 refills | Status: DC

## 2019-10-28 MED ORDER — COMBIVENT RESPIMAT 20-100 MCG/ACT IN AERS: 1.0000 | INHALATION_SPRAY | Freq: Four times a day (QID) | RESPIRATORY_TRACT | 3 refills | Status: DC

## 2019-10-28 MED ORDER — OMEPRAZOLE 40 MG PO CPDR: 40.0000 mg | DELAYED_RELEASE_CAPSULE | ORAL | 11 refills | Status: DC

## 2019-10-28 NOTE — Progress Notes (Signed)
Synopsis: Referred in January 2021 for CODP by [name], MD.  Subjective:   PATIENT ID: Cody Tate GENDER: male DOB: [birth year], MRN: [id number]  Chief Complaint  Patient presents with  . Follow-up    shortness of breath with activity    Cody Tate is a 69 year old gentleman who presents for follow-up of COPD.  He is accompanied today by his wife.  He has been more active as the weather has improved so he has had more dyspnea on exertion, but not shortness of breath at rest.  No wheezing, cough.  Minimal morning sputum production, which is his baseline.  He uses Combivent 2-3 times per day with symptom benefit.  Overall his symptoms are better than they were 1 to 2 years ago before he was on triple inhaled therapy.  He continues on montelukast and Flonase for allergic rhinitis.  For his GERD he did not have symptom benefit of pantoprazole, but has improved since being back on omeprazole from his PCP.  He is up-to-date on his flu shot and received his Covid booster.  Recent oncology visit 8/27 reviewed- in [facility], not felt to be related to meningioma.     OV 04/21/19: Cody Tate is a 69 year old gentleman with a history of COPD who presents for follow-up.  He is accompanied by his daughter today.  He has been doing well since his last visit.  He was switched from Symbicort to Anoro plus Pulmicort.  His shortness of breath is better and his coughing and wheezing resolved.  He is able to walk around his house more easily, but his chronic leg and back pain continue to limit his mobility.  He ambulates with a walker.  He has noticed his breathing has been slightly worse since the pollen has been out, but he is doing well overall.  He has continued Flonase and montelukast, but had to stop azelastine due to nasal dryness.  He takes Benadryl at night before bed.  He has chronic leg edema, left greater than right that has been ongoing since spine surgery many years ago.  He has significant GERD  with reflux symptoms several days per week despite daily omeprazole.  He was previously on Nexium with better control, but his insurance is no longer covering it.   OV 02/24/19: Cody Tate is a 69 year old gentleman with a history of COPD and asthma overlap.  At his last visit he was started on Symbicort.  Although he had side effects to Spiriva, due to insurance coverage he was not able to switch this to Trelegy.  Spiriva causes hoarseness and throat irritation causing dysphagia.  No odynophagia.  He started Singulair at his last visit.  He continues to use Combivent as his rescue inhaler.  Since his last visit his breathing is significantly improved, he seldom wheezes, he is sleeping better, and his endurance is better.  He has chronic sinusitis for which he takes Afrin, Singulair, Flonase, azelastine, and Sudafed.  He is unable to take antihistamines due to urinary retention.  Azelastine was causing excessive nasal dryness, and he is decreased his dose to once daily dosing.  He is reluctant to stop Sudafed and Afrin due to rebound congestion.   OV 01/29/19: Cody Tate is a 69 year old gentleman who presents for evaluation of severe dyspnea on exertion and wheezing have been worsening for at least the past year.  He has chest congestion with a cough productive of thick yellow sputum.  He has trouble clearing sputum, but albuterol helps.  He was diagnosed with COPD on spirometry several years ago, and has been on Spiriva once daily.  He is using albuterol multiple times a day, more than every 4 hours.  He is unsure if Spiriva is helping or making his symptoms worse; he sometimes feels more short of breath after using it.  He was treated with prednisone in August and doxycycline in December for acute exacerbation; the doxycycline in December did not improve his symptoms.  He is able to walk less than 1 block on flat ground.  He is able to walk around his house, but is unable to both walk and talk at the same  time.  He denies fever, chills, sweats.  He has chronic GERD, which is well controlled on omeprazole and dietary modifications.  He has chronic sinus issues with severe postnasal drip.  He has been taking Sudafed, Mucinex, and using Afrin nasal spray, which she has been on for several years.  He is using steroid nasal spray, which only helps for a few hours.  He has chronic aortic stenosis, and has a follow-up echocardiogram tomorrow.  He has noticed more lower extremity edema recently, especially on the left.  He still working on cutting down his sodium, but is diuretics were not increased.  He has gained 10 to 12 pounds recently, which he thinks is more due to diet than edema.  He quit smoking in 2002 after 35 years x 1.5 packs/day.  He has never felt like his breathing was normal.  In elementary school he was not able to keep up with the other kids.  He always had shortness of breath when he was doing his physical training during his [employer].  He was born full-term infant as far as he knows.  He had an uncle with COPD, but otherwise there is no family history of lung disease.   Past Medical History:  Diagnosis Date  . Arthritis   . BPH (benign prostatic hyperplasia)   . COPD (chronic obstructive pulmonary disease) ([place])   . Enlarged prostate   . GERD (gastroesophageal reflux disease)   . Glaucoma   . H/O hiatal hernia   . H/O wheezing    occ inhaler usage  . Heart murmur   . Hypertension   . Meningioma ([place])   . Pulmonary nodules    resolved on 2019 follow up CT     Family History  Problem Relation Age of Onset  . COPD Maternal Uncle      Past Surgical History:  Procedure Laterality Date  . BACK SURGERY  06,07,08   lam x3 cerv x 1  . CERVICAL FUSION    . CYSTOSCOPY     69 yrs old  . EYE SURGERY     bil  . KNEE ARTHROSCOPY  10   lft  . KNEE ARTHROSCOPY Left 07/23/2012   Procedure: LEFT MEDIAL MENISCAL DEBRIDEMENT AND CHONDROPLASTY;  Surgeon: [name], MD;   Location: [facility];  Service: Orthopedics;  Laterality: Left;  . LUMBAR FUSION  02/22/2014   DR POOL  . PILONIDAL CYST EXCISION    . SINUS EXPLORATION    . SPINAL CORD STIMULATOR INSERTION N/A 01/16/2018   Procedure: LUMBAR SPINAL CORD STIMULATOR INSERTION;  Surgeon: [name], MD;  Location: [place];  Service: Neurosurgery;  Laterality: N/A;  LUMBAR SPINAL CORD STIMULATOR INSERTION  . WRIST GANGLION EXCISION     rt  . WRIST SURGERY  09   cyst lft    Social History  Socioeconomic History  . Marital status: Married    Spouse name: Not on file  . Number of children: Not on file  . Years of education: Not on file  . Highest education level: Not on file  Occupational History  . Not on file  Tobacco Use  . Smoking status: Former Smoker    Packs/day: 1.50    Years: 35.00    Pack years: 52.50    Types: Cigarettes    Quit date: 05/03/2000    Years since quitting: 19.4  . Smokeless tobacco: Never Used  . Tobacco comment: quit alcohol 73  Vaping Use  . Vaping Use: Never used  Substance and Sexual Activity  . Alcohol use: No  . Drug use: No  . Sexual activity: Not Currently  Other Topics Concern  . Not on file  Social History Narrative   08-20-17 Unable to ask abuse questions wife with him today.   Social Determinants of Health   Financial Resource Strain:   . Difficulty of Paying Living Expenses: Not on file  Food Insecurity:   . Worried About Charity fundraiser in the Last Year: Not on file  . Ran Out of Food in the Last Year: Not on file  Transportation Needs:   . Lack of Transportation (Medical): Not on file  . Lack of Transportation (Non-Medical): Not on file  Physical Activity:   . Days of Exercise per Week: Not on file  . Minutes of Exercise per Session: Not on file  Stress:   . Feeling of Stress : Not on file  Social Connections:   . Frequency of Communication with Friends and Family: Not on file  . Frequency of Social Gatherings with Friends and Family: Not  on file  . Attends Religious Services: Not on file  . Active Member of Clubs or Organizations: Not on file  . Attends Archivist Meetings: Not on file  . Marital Status: Not on file  Intimate Partner Violence:   . Fear of Current or Ex-Partner: Not on file  . Emotionally Abused: Not on file  . Physically Abused: Not on file  . Sexually Abused: Not on file     Allergies  Allergen Reactions  . Adhesive [Tape] Itching, Rash and Other (See Comments)    Occlusive tape and bandaids  . Other Itching, Rash and Other (See Comments)    VICRYL Sutures     Immunization History  Administered Date(s) Administered  . DTP 06/01/2001  . Fluad Quad(high Dose 65+) 09/16/2019  . Influenza Split 10/16/2010, 10/08/2012, 10/24/2012, 10/20/2013  . Influenza, High Dose Seasonal PF 10/22/2014, 09/07/2015  . Influenza-Unspecified 10/03/2018  . PFIZER SARS-COV-2 Vaccination 02/13/2019, 03/10/2019  . Pneumococcal Conjugate-13 10/22/2014  . Pneumococcal Polysaccharide-23 02/10/2015  . Tdap 09/18/2011, 05/17/2012  . Zoster 08/04/2010  . Zoster Recombinat (Shingrix) 08/18/2018    Outpatient Medications Prior to Visit  Medication Sig Dispense Refill  . Ascorbic Acid (VITAMIN C) 1000 MG tablet     . aspirin 81 MG tablet Take 81 mg by mouth daily.     . Cholecalciferol (VITAMIN D3) 50 MCG (2000 UT) capsule     . diphenhydrAMINE (BENADRYL) 25 mg capsule Take 25 mg by mouth at bedtime as needed for sleep.    . fluticasone (FLONASE) 50 MCG/ACT nasal spray Place 2 sprays into both nostrils daily.     Marland Kitchen gabapentin (NEURONTIN) 300 MG capsule Take 300 mg by mouth 3 (three) times daily.     Marland Kitchen guaifenesin (HUMIBID E)  400 MG TABS Take 400 mg by mouth every 4 (four) hours as needed (for congestion or mucus).     . hydrochlorothiazide (HYDRODIURIL) 25 MG tablet Take 25 mg by mouth every morning.     Marland Kitchen ibuprofen (ADVIL,MOTRIN) 200 MG tablet Take 600 mg by mouth every 6 (six) hours as needed for headache or  moderate pain.     Marland Kitchen latanoprost (XALATAN) 0.005 % ophthalmic solution Place 1 drop into both eyes at bedtime.     . magnesium hydroxide (MILK OF MAGNESIA) 400 MG/5ML suspension Take 15 mLs by mouth at bedtime.     . metoprolol succinate (TOPROL-XL) 100 MG 24 hr tablet Take 100 mg by mouth at bedtime. Take with or immediately following a meal.    . montelukast (SINGULAIR) 10 MG tablet Take 1 tablet (10 mg total) by mouth at bedtime. 30 tablet 11  . Multiple Vitamins-Minerals (PRESERVISION AREDS 2+MULTI VIT) CAPS     . Oxycodone HCl 10 MG TABS Take 10 mg by mouth every 4 (four) hours as needed.    Marland Kitchen oxymetazoline (AFRIN) 0.05 % nasal spray Place 2 sprays into both nostrils 2 (two) times daily as needed for congestion.    . Polyvinyl Alcohol-Povidone PF (REFRESH) 1.4-0.6 % SOLN Place 2 drops into both eyes 2 (two) times daily as needed (for dry eyes).    . ramipril (ALTACE) 10 MG capsule Take 10 mg by mouth every morning.     Marland Kitchen Respiratory Therapy Supplies (FLUTTER) DEVI 1 each by Does not apply route daily. 1 each 0  . Simethicone (GAS-X EXTRA STRENGTH) 125 MG CAPS Take 125 mg by mouth 4 (four) times daily as needed (for gas).     . simvastatin (ZOCOR) 20 MG tablet Take 20 mg by mouth at bedtime.     Marland Kitchen spironolactone (ALDACTONE) 25 MG tablet Take 12.5 mg by mouth at bedtime.     . Tamsulosin HCl (FLOMAX) 0.4 MG CAPS Take 0.4 mg by mouth 2 (two) times daily.    Jearl Klinefelter ELLIPTA 62.5-25 MCG/INH AEPB INHALE 1 PUFF INTO THE LUNGS DAILY 60 each 5  . budesonide (PULMICORT FLEXHALER) 180 MCG/ACT inhaler Inhale 2 puffs into the lungs in the morning and at bedtime. 1 each 11  . Ipratropium-Albuterol (COMBIVENT RESPIMAT) 20-100 MCG/ACT AERS respimat Inhale 1 puff into the lungs every 6 (six) hours. 4 g 11  . omeprazole (PRILOSEC) 40 MG capsule Take 40 mg by mouth every morning.    . Iron, Ferrous Sulfate, 325 (65 Fe) MG TABS Take 1 tablet by mouth every other day. Monday, Wednesday and Friday    . OXYCODONE  ER PO Take 10 mg by mouth every 5 (five) hours as needed.    . pantoprazole (PROTONIX) 40 MG tablet Take 1 tablet (40 mg total) by mouth daily. (Patient not taking: Reported on 10/28/2019) 30 tablet 11   No facility-administered medications prior to visit.    Review of Systems  Constitutional: Negative for chills, fever and weight loss.  HENT: Positive for congestion.        Postnasal drip  Respiratory: Positive for cough, shortness of breath and wheezing.   Cardiovascular: Positive for leg swelling. Negative for chest pain.  Gastrointestinal: Positive for heartburn.  Genitourinary:       Prostate issues/urinary retention  Neurological:       Balance issues     Objective:   Vitals:   10/28/19 1206  BP: 132/70  Pulse: 73  Temp: (!) 97.3 F (36.3 C)  TempSrc: Other (Comment)  SpO2: 98%  Weight: 218 lb 9.6 oz (99.2 kg)  Height: 5' 6.5" (1.689 m)   98% on   RA BMI Readings from Last 3 Encounters:  10/28/19 34.75 kg/m  08/28/19 36.80 kg/m  04/21/19 36.80 kg/m   Wt Readings from Last 3 Encounters:  10/28/19 218 lb 9.6 oz (99.2 kg)  04/21/19 228 lb (103.4 kg)  02/24/19 225 lb 3.2 oz (102.2 kg)    Physical Exam Vitals reviewed.  Constitutional:      General: He is not in acute distress.    Appearance: He is obese. He is not ill-appearing.  HENT:     Head: Normocephalic and atraumatic.  Eyes:     General: No scleral icterus. Cardiovascular:     Rate and Rhythm: Normal rate and regular rhythm.     Heart sounds: No murmur heard.   Pulmonary:     Comments: Breathing comfortably on room air, no conversational dyspnea.  Clear to auscultation bilaterally. Abdominal:     General: There is no distension.  Musculoskeletal:        General: No deformity.     Cervical back: Neck supple.  Skin:    General: Skin is warm and dry.     Findings: No rash.  Neurological:     General: No focal deficit present.     Mental Status: He is alert.     Coordination: Coordination  normal.  Psychiatric:        Mood and Affect: Mood normal.        Behavior: Behavior normal.      CBC    Component Value Date/Time   WBC 6.7 01/02/2018 1450   RBC 5.29 01/02/2018 1450   HGB 13.5 01/02/2018 1450   HCT 43.7 01/02/2018 1450   PLT 318 01/02/2018 1450   MCV 82.6 01/02/2018 1450   MCH 25.5 (L) 01/02/2018 1450   MCHC 30.9 01/02/2018 1450   RDW 13.7 01/02/2018 1450   LYMPHSABS 1.3 02/12/2014 1313   MONOABS 0.7 02/12/2014 1313   EOSABS 0.1 02/12/2014 1313   BASOSABS 0.1 02/12/2014 1313    CHEMISTRY No results for input(s): NA, K, CL, CO2, GLUCOSE, BUN, CREATININE, CALCIUM, MG, PHOS in the last 168 hours. CrCl cannot be calculated (Patient's most recent lab result is older than the maximum 21 days allowed.).   Chest Imaging- films reviewed: CT chest 03/01/2017- moderate airway thickening, likely mild bronchiectasis in lower lobes.  No significant emphysema.  No mediastinal or hilar adenopathy.  Patulous esophagus.  01/30/19 bilateral lower extremity ultrasound-no DVT. Baker's cyst on the right.  Pulmonary Functions Testing Results: No flowsheet data found.  Per [facility] records 2018: spirometry 01/2013 FEV1/FVC equals 64%, FEV1 equals 72% mild COPD  Spirometry 2015: FVC 3.47 (83%)--> 3.59 (86%, +3%) FEV1 2.24 (72%)--> 2.37 (76%, +6%)  Ratio 65 Flow volume loop supports obstruction  Echocardiogram 01/10/2018: LVEF 65 to 70%.  Grade 1 diastolic dysfunction, elevated LVEDP.  Mild AS.  LA, RV, RA.  Trivial MR and TR, otherwise normal valves.  NM SPECT 01/10/2018: LVEF 67%, hyperdynamic function.  Abnormal baseline EKG, no ischemic changes during test.  Normal myocardial perfusion; low risk study.    Assessment & Plan:     ICD-10-CM   1. Chronic obstructive pulmonary disease, unspecified COPD type ([place])  J44.9   2. Allergic rhinitis, unspecified seasonality, unspecified trigger  J30.9     COPD-  Question if he has an asthma overlap. 2 mild AE in the past year. GOLD  D. -Continue triple inhaled therapy-Anoro plus Pulmicort.  Both refilled today for 29-month supplies with refills for a year. He should continue to rinse his mouth after every use. -Continue Combivent rescue inhaler every 4 hours as needed. -Continue allergic rhinosinusitis management with montelukast and Flonase nasal spray.  Previously had to stop azelastine due to nasal dryness. -Up-to-date on Covid, flu, pneumonia vaccines.  Agree with continuing Covid precautions given community transmission rates.  Allergic rhinosinusitis -Continue Flonase bilaterally once daily  -Continue Singulair daily.  No oral antihistamines due to urinary retention. -He continues to use Afrin, which he understands will cause rebound symptoms.  RTC in 3 months with Dr. [name].    Current Outpatient Medications:  .  Ascorbic Acid (VITAMIN C) 1000 MG tablet, , Disp: , Rfl:  .  aspirin 81 MG tablet, Take 81 mg by mouth daily. , Disp: , Rfl:  .  budesonide (PULMICORT FLEXHALER) 180 MCG/ACT inhaler, Inhale 2 puffs into the lungs in the morning and at bedtime., Disp: 3 each, Rfl: 3 .  Cholecalciferol (VITAMIN D3) 50 MCG (2000 UT) capsule, , Disp: , Rfl:  .  diphenhydrAMINE (BENADRYL) 25 mg capsule, Take 25 mg by mouth at bedtime as needed for sleep., Disp: , Rfl:  .  fluticasone (FLONASE) 50 MCG/ACT nasal spray, Place 2 sprays into both nostrils daily. , Disp: , Rfl:  .  gabapentin (NEURONTIN) 300 MG capsule, Take 300 mg by mouth 3 (three) times daily. , Disp: , Rfl:  .  guaifenesin (HUMIBID E) 400 MG TABS, Take 400 mg by mouth every 4 (four) hours as needed (for congestion or mucus). , Disp: , Rfl:  .  hydrochlorothiazide (HYDRODIURIL) 25 MG tablet, Take 25 mg by mouth every morning. , Disp: , Rfl:  .  ibuprofen (ADVIL,MOTRIN) 200 MG tablet, Take 600 mg by mouth every 6 (six) hours as needed for headache or moderate pain. , Disp: , Rfl:  .  Ipratropium-Albuterol (COMBIVENT RESPIMAT) 20-100 MCG/ACT AERS respimat,  Inhale 1 puff into the lungs every 6 (six) hours., Disp: 12 g, Rfl: 3 .  latanoprost (XALATAN) 0.005 % ophthalmic solution, Place 1 drop into both eyes at bedtime. , Disp: , Rfl:  .  magnesium hydroxide (MILK OF MAGNESIA) 400 MG/5ML suspension, Take 15 mLs by mouth at bedtime. , Disp: , Rfl:  .  metoprolol succinate (TOPROL-XL) 100 MG 24 hr tablet, Take 100 mg by mouth at bedtime. Take with or immediately following a meal., Disp: , Rfl:  .  montelukast (SINGULAIR) 10 MG tablet, Take 1 tablet (10 mg total) by mouth at bedtime., Disp: 30 tablet, Rfl: 11 .  Multiple Vitamins-Minerals (PRESERVISION AREDS 2+MULTI VIT) CAPS, , Disp: , Rfl:  .  Oxycodone HCl 10 MG TABS, Take 10 mg by mouth every 4 (four) hours as needed., Disp: , Rfl:  .  oxymetazoline (AFRIN) 0.05 % nasal spray, Place 2 sprays into both nostrils 2 (two) times daily as needed for congestion., Disp: , Rfl:  .  Polyvinyl Alcohol-Povidone PF (REFRESH) 1.4-0.6 % SOLN, Place 2 drops into both eyes 2 (two) times daily as needed (for dry eyes)., Disp: , Rfl:  .  ramipril (ALTACE) 10 MG capsule, Take 10 mg by mouth every morning. , Disp: , Rfl:  .  Respiratory Therapy Supplies (FLUTTER) DEVI, 1 each by Does not apply route daily., Disp: 1 each, Rfl: 0 .  Simethicone (GAS-X EXTRA STRENGTH) 125 MG CAPS, Take 125 mg by mouth 4 (four) times daily as needed (for gas). ,  Disp: , Rfl:  .  simvastatin (ZOCOR) 20 MG tablet, Take 20 mg by mouth at bedtime. , Disp: , Rfl:  .  spironolactone (ALDACTONE) 25 MG tablet, Take 12.5 mg by mouth at bedtime. , Disp: , Rfl:  .  Tamsulosin HCl (FLOMAX) 0.4 MG CAPS, Take 0.4 mg by mouth 2 (two) times daily., Disp: , Rfl:  .  umeclidinium-vilanterol (ANORO ELLIPTA) 62.5-25 MCG/INH AEPB, Inhale 1 puff into the lungs daily., Disp: 180 each, Rfl: 3 .  Iron, Ferrous Sulfate, 325 (65 Fe) MG TABS, Take 1 tablet by mouth every other day. Monday, Wednesday and Friday, Disp: , Rfl:  .  omeprazole (PRILOSEC) 20 MG capsule, Take 1  capsule (20 mg total) by mouth daily., Disp: 90 capsule, Rfl: 3 .  OXYCODONE ER PO, Take 10 mg by mouth every 5 (five) hours as needed., Disp: , Rfl:     [name], DO [facility] 10/28/2019 12:27 PM

## 2019-10-28 NOTE — Patient Instructions (Addendum)
Thank you for visiting Dr. Carlis Abbott at [facility]. We recommend the following:   Meds ordered this encounter  Medications  . omeprazole (PRILOSEC) 20 MG capsule    Sig: Take 1 capsule (20 mg total) by mouth daily.    Dispense:  90 capsule    Refill:  3  . budesonide (PULMICORT FLEXHALER) 180 MCG/ACT inhaler    Sig: Inhale 2 puffs into the lungs in the morning and at bedtime.    Dispense:  3 each    Refill:  3  . Ipratropium-Albuterol (COMBIVENT RESPIMAT) 20-100 MCG/ACT AERS respimat    Sig: Inhale 1 puff into the lungs every 6 (six) hours.    Dispense:  12 g    Refill:  3  . umeclidinium-vilanterol (ANORO ELLIPTA) 62.5-25 MCG/INH AEPB    Sig: Inhale 1 puff into the lungs daily.    Dispense:  180 each    Refill:  3    Return in about 3 months (around 01/28/2020). with Dr. [name] (30 minutes visit).    Please do your part to reduce the spread of COVID-19.

## 2019-10-29 ENCOUNTER — Telehealth: Payer: Self-pay | Admitting: Critical Care Medicine

## 2019-10-29 NOTE — Telephone Encounter (Signed)
Called and spoke with pt who stated he had seen that Rx for omeprazole was sent in for 20mg  but pt stated that he has been taking 40mg  omeprazole for years after it was first prescribed by PCP.  Dr. Carlis Abbott, please advise if you want to leave pt on the Rx that you sent in for 20mg  omeprazole or if you want to change this to 40mg .

## 2019-10-30 MED ORDER — OMEPRAZOLE 40 MG PO CPDR: 40.0000 mg | DELAYED_RELEASE_CAPSULE | Freq: Every day | ORAL | 3 refills | Status: DC

## 2019-10-30 NOTE — Telephone Encounter (Signed)
Called and spoke with patient. He is aware Dr. Carlis Abbott is ok with the prescription switch. Called [facility] to cancel the 20mg  prescription.   Nothing further needed at time of call.

## 2019-10-30 NOTE — Telephone Encounter (Signed)
That is fine.  [name], DO 10/30/19 11:15 AM [facility]

## 2019-11-02 DIAGNOSIS — H353231 Exudative age-related macular degeneration, bilateral, with active choroidal neovascularization: Secondary | ICD-10-CM | POA: Diagnosis not present

## 2019-11-10 DIAGNOSIS — E7800 Pure hypercholesterolemia, unspecified: Secondary | ICD-10-CM | POA: Diagnosis not present

## 2019-11-10 DIAGNOSIS — H409 Unspecified glaucoma: Secondary | ICD-10-CM | POA: Diagnosis not present

## 2019-11-10 DIAGNOSIS — I10 Essential (primary) hypertension: Secondary | ICD-10-CM | POA: Diagnosis not present

## 2019-11-10 DIAGNOSIS — K219 Gastro-esophageal reflux disease without esophagitis: Secondary | ICD-10-CM | POA: Diagnosis not present

## 2019-11-10 DIAGNOSIS — N400 Enlarged prostate without lower urinary tract symptoms: Secondary | ICD-10-CM | POA: Diagnosis not present

## 2019-11-10 DIAGNOSIS — D508 Other iron deficiency anemias: Secondary | ICD-10-CM | POA: Diagnosis not present

## 2019-11-10 DIAGNOSIS — G8929 Other chronic pain: Secondary | ICD-10-CM | POA: Diagnosis not present

## 2019-11-10 DIAGNOSIS — J449 Chronic obstructive pulmonary disease, unspecified: Secondary | ICD-10-CM | POA: Diagnosis not present

## 2019-12-14 DIAGNOSIS — H401132 Primary open-angle glaucoma, bilateral, moderate stage: Secondary | ICD-10-CM | POA: Diagnosis not present

## 2019-12-14 DIAGNOSIS — H353231 Exudative age-related macular degeneration, bilateral, with active choroidal neovascularization: Secondary | ICD-10-CM | POA: Diagnosis not present

## 2019-12-24 DIAGNOSIS — I10 Essential (primary) hypertension: Secondary | ICD-10-CM | POA: Diagnosis not present

## 2019-12-24 DIAGNOSIS — G8929 Other chronic pain: Secondary | ICD-10-CM | POA: Diagnosis not present

## 2019-12-24 DIAGNOSIS — N400 Enlarged prostate without lower urinary tract symptoms: Secondary | ICD-10-CM | POA: Diagnosis not present

## 2019-12-24 DIAGNOSIS — K219 Gastro-esophageal reflux disease without esophagitis: Secondary | ICD-10-CM | POA: Diagnosis not present

## 2019-12-24 DIAGNOSIS — E7800 Pure hypercholesterolemia, unspecified: Secondary | ICD-10-CM | POA: Diagnosis not present

## 2019-12-24 DIAGNOSIS — D508 Other iron deficiency anemias: Secondary | ICD-10-CM | POA: Diagnosis not present

## 2019-12-24 DIAGNOSIS — J449 Chronic obstructive pulmonary disease, unspecified: Secondary | ICD-10-CM | POA: Diagnosis not present

## 2019-12-24 DIAGNOSIS — H409 Unspecified glaucoma: Secondary | ICD-10-CM | POA: Diagnosis not present

## 2020-01-02 DIAGNOSIS — C801 Malignant (primary) neoplasm, unspecified: Secondary | ICD-10-CM

## 2020-01-02 HISTORY — DX: Malignant (primary) neoplasm, unspecified: C80.1

## 2020-01-11 DIAGNOSIS — H353221 Exudative age-related macular degeneration, left eye, with active choroidal neovascularization: Secondary | ICD-10-CM | POA: Diagnosis not present

## 2020-01-11 DIAGNOSIS — H401331 Pigmentary glaucoma, bilateral, mild stage: Secondary | ICD-10-CM | POA: Diagnosis not present

## 2020-01-22 NOTE — Progress Notes (Signed)
Cardiology Office Note   Date:  01/22/2020   ID:  Cody Tate, DOB 18-Mar-1950, MRN [id number]  PCP:  [name], MD  Cardiologist:   [name], MD   No chief complaint on file.     History of Present Illness: Cody Tate is a 70 y.o. male who presents for f/u AS small LVOT gradient and HTN .  Former smoker with HTN and COPD. 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 Arthritis   . BPH (benign prostatic hyperplasia)   . COPD (chronic obstructive pulmonary disease) ([place])   . Enlarged prostate   . GERD (gastroesophageal reflux disease)   . Glaucoma   . H/O hiatal hernia   . H/O wheezing    occ inhaler usage  . Heart murmur   . Hypertension   . Meningioma ([place])   . Pulmonary nodules    resolved on 2019 follow up CT    Past Surgical History:  Procedure Laterality Date  . BACK SURGERY  06,07,08   lam x3 cerv x 1  . CERVICAL FUSION    . CYSTOSCOPY     70 yrs old  . EYE SURGERY     bil  . KNEE ARTHROSCOPY  10   lft  . KNEE ARTHROSCOPY Left 07/23/2012   Procedure: LEFT MEDIAL MENISCAL DEBRIDEMENT AND CHONDROPLASTY;  Surgeon: [name], MD;  Location: [facility];   Service: Orthopedics;  Laterality: Left;  . LUMBAR FUSION  02/22/2014   DR POOL  . PILONIDAL CYST EXCISION    . SINUS EXPLORATION    . SPINAL CORD STIMULATOR INSERTION N/A 01/16/2018   Procedure: LUMBAR SPINAL CORD STIMULATOR INSERTION;  Surgeon: [name], MD;  Location: [facility];  Service: Neurosurgery;  Laterality: N/A;  LUMBAR SPINAL CORD STIMULATOR INSERTION  . WRIST GANGLION EXCISION     rt  . WRIST SURGERY  09   cyst lft     Current Outpatient Medications  Medication Sig Dispense Refill  . Ascorbic Acid (VITAMIN C) 1000 MG tablet     . aspirin 81 MG tablet Take 81 mg by mouth daily.     . budesonide (PULMICORT FLEXHALER) 180 MCG/ACT inhaler Inhale 2 puffs into the lungs in the morning and at bedtime. 3 each 3  . Cholecalciferol (VITAMIN D3) 50 MCG (2000 UT) capsule     . diphenhydrAMINE (BENADRYL) 25 mg capsule Take 25 mg by mouth at bedtime as needed for sleep.    . fluticasone (FLONASE) 50 MCG/ACT nasal spray Place 2 sprays into both nostrils daily.     Marland Kitchen gabapentin (NEURONTIN) 300 MG capsule  Take 300 mg by mouth 3 (three) times daily.     Marland Kitchen guaifenesin (HUMIBID E) 400 MG TABS Take 400 mg by mouth every 4 (four) hours as needed (for congestion or mucus).     . hydrochlorothiazide (HYDRODIURIL) 25 MG tablet Take 25 mg by mouth every morning.     Marland Kitchen ibuprofen (ADVIL,MOTRIN) 200 MG tablet Take 600 mg by mouth every 6 (six) hours as needed for headache or moderate pain.     . Ipratropium-Albuterol (COMBIVENT RESPIMAT) 20-100 MCG/ACT AERS respimat Inhale 1 puff into the lungs every 6 (six) hours. 12 g 3  . Iron, Ferrous Sulfate, 325 (65 Fe) MG TABS Take 1 tablet by mouth every other day. Monday, Wednesday and Friday    . latanoprost (XALATAN) 0.005 % ophthalmic solution Place 1 drop into both eyes at bedtime.     . magnesium hydroxide (MILK OF MAGNESIA) 400 MG/5ML suspension Take 15 mLs by mouth at bedtime.     . metoprolol succinate (TOPROL-XL) 100 MG 24 hr tablet Take 100 mg by  mouth at bedtime. Take with or immediately following a meal.    . montelukast (SINGULAIR) 10 MG tablet Take 1 tablet (10 mg total) by mouth at bedtime. 30 tablet 11  . Multiple Vitamins-Minerals (PRESERVISION AREDS 2+MULTI VIT) CAPS     . omeprazole (PRILOSEC) 40 MG capsule Take 1 capsule (40 mg total) by mouth daily. 90 capsule 3  . OXYCODONE ER PO Take 10 mg by mouth every 5 (five) hours as needed.    . Oxycodone HCl 10 MG TABS Take 10 mg by mouth every 4 (four) hours as needed.    Marland Kitchen oxymetazoline (AFRIN) 0.05 % nasal spray Place 2 sprays into both nostrils 2 (two) times daily as needed for congestion.    . Polyvinyl Alcohol-Povidone PF (REFRESH) 1.4-0.6 % SOLN Place 2 drops into both eyes 2 (two) times daily as needed (for dry eyes).    . ramipril (ALTACE) 10 MG capsule Take 10 mg by mouth every morning.     Marland Kitchen Respiratory Therapy Supplies (FLUTTER) DEVI 1 each by Does not apply route daily. 1 each 0  . Simethicone (GAS-X EXTRA STRENGTH) 125 MG CAPS Take 125 mg by mouth 4 (four) times daily as needed (for gas).     . simvastatin (ZOCOR) 20 MG tablet Take 20 mg by mouth at bedtime.     Marland Kitchen spironolactone (ALDACTONE) 25 MG tablet Take 12.5 mg by mouth at bedtime.     . Tamsulosin HCl (FLOMAX) 0.4 MG CAPS Take 0.4 mg by mouth 2 (two) times daily.    Marland Kitchen umeclidinium-vilanterol (ANORO ELLIPTA) 62.5-25 MCG/INH AEPB Inhale 1 puff into the lungs daily. 180 each 3   No current facility-administered medications for this visit.    Allergies:   Adhesive [tape] and Other    Social History:  The patient  reports that he quit smoking about 19 years ago. His smoking use included cigarettes. He has a 52.50 pack-year smoking history. He has never used smokeless tobacco. He reports that he does not drink alcohol and does not use drugs.   Family History:  The patient's family history includes COPD in his maternal uncle.    ROS:  Please see the history of present illness.   Otherwise, review of systems are  positive for none.   All other systems are reviewed and negative.    PHYSICAL EXAM: VS:  There were no vitals taken for this visit. , BMI There is no height or  weight on file to calculate BMI. Affect appropriate Healthy:  appears stated age 48: normal Neck supple with no adenopathy JVP normal no bruits no thyromegaly Lungs diffuse rhonchi and  wheezing and good diaphragmatic motion Heart:  S1/S2 SEM murmur, no rub, gallop or click PMI normal Abdomen: benighn, BS positve, no tenderness, no AAA no bruit.  No HSM or HJR Distal pulses intact with no bruits Plus 2 bilateral edema Neuro non-focal Skin warm and dry Previous lumbar fusion with nerve stimulator in place     EKG:  See HPI  01/13/18 SR rate 77 RBBB lateral T wave changes 01/29/2020 SR rate 95 RBBB no changes    Recent Labs: 08/21/2019: Creatinine, Ser 1.10    Lipid Panel No results found for: CHOL, TRIG, HDL, CHOLHDL, VLDL, LDLCALC, LDLDIRECT    Wt Readings from Last 3 Encounters:  10/28/19 99.2 kg  04/21/19 103.4 kg  02/24/19 102.2 kg      Other studies Reviewed: Myovue 01/10/18 Echo 01/10/18  .    ASSESSMENT AND PLAN:  1. Abnormal ECG:  Likely form HTN and LVH / valve disease  Normal myovue 01/10/18  2. COPD: F/U [facility] pulmonary up to date with vaccines  3. HTN:  Well controlled.  Continue current medications and low sodium Dash type diet.   4. HLD  Continue statin labs with Dr Felipa Eth  5. AS:  Mild  mean gradient 8 peak 63mmhg TTE done 01/30/19  Continue beta blocker avoid excess  diuretics  6. Back Pain: post nerve stimulator placement Dr [name] 01/16/18 previous L1-5 fusion  7. Edema:  Dependant from obesity continue aldactone at night and change to Lasix in am Check BMET/BNP today 8. Prostate:  Check PSA f/u urology / [place] consider Proscar    Current medicines are reviewed at length with the patient today.  The patient does not have concerns regarding medicines.  The following changes  have been made:  D/C HCTZ start Lasix 20 mg daily   Labs/ tests ordered today include:   BMET/BNP/PSA   No orders of the defined types were placed in this encounter.    Disposition:   FU with cardiology in  3 months     Signed, [name], MD  01/22/2020 1:26 PM    [facility] [address], [place], [place]  [address] Phone: [phone]; Fax: [phone]

## 2020-01-25 DIAGNOSIS — H353231 Exudative age-related macular degeneration, bilateral, with active choroidal neovascularization: Secondary | ICD-10-CM | POA: Diagnosis not present

## 2020-01-28 ENCOUNTER — Ambulatory Visit: Payer: Medicare Other | Admitting: Pulmonary Disease

## 2020-01-29 ENCOUNTER — Encounter: Payer: Self-pay | Admitting: Cardiovascular Disease

## 2020-01-29 ENCOUNTER — Other Ambulatory Visit: Payer: Self-pay

## 2020-01-29 ENCOUNTER — Ambulatory Visit (INDEPENDENT_AMBULATORY_CARE_PROVIDER_SITE_OTHER): Payer: Medicare Other | Admitting: Cardiovascular Disease

## 2020-01-29 VITALS — BP 154/66 | HR 95 | Ht 66.5 in | Wt 215.8 lb

## 2020-01-29 DIAGNOSIS — I350 Nonrheumatic aortic (valve) stenosis: Secondary | ICD-10-CM | POA: Diagnosis not present

## 2020-01-29 DIAGNOSIS — N400 Enlarged prostate without lower urinary tract symptoms: Secondary | ICD-10-CM

## 2020-01-29 DIAGNOSIS — R011 Cardiac murmur, unspecified: Secondary | ICD-10-CM | POA: Diagnosis not present

## 2020-01-29 DIAGNOSIS — R609 Edema, unspecified: Secondary | ICD-10-CM

## 2020-01-29 DIAGNOSIS — R0602 Shortness of breath: Secondary | ICD-10-CM | POA: Diagnosis not present

## 2020-01-29 MED ORDER — FUROSEMIDE 20 MG PO TABS: 20.0000 mg | ORAL_TABLET | Freq: Every day | ORAL | 3 refills | Status: DC

## 2020-01-29 NOTE — Patient Instructions (Addendum)
Medication Instructions:  Your physician has recommended you make the following change in your medication:  1-STOP Hydrochlorothiazide 2-START Furosamide 20 mg by mouth daily.  *If you need a refill on your cardiac medications before your next appointment, please call your pharmacy*  Lab Work: Your physician recommends that you have lab work today. BMET, BNP and PSA  If you have labs (blood work) drawn today and your tests are completely normal, you will receive your results only by: Marland Kitchen MyChart Message (if you have MyChart) OR . A paper copy in the mail If you have any lab test that is abnormal or we need to change your treatment, we will call you to review the results.  Testing/Procedures: None ordered today.  Follow-Up: At [facility], you and your health needs are our priority.  As part of our continuing mission to provide you with exceptional heart care, we have created designated Provider Care Teams.  These Care Teams include your primary Cardiologist (physician) and Advanced Practice Providers (APPs -  Physician Assistants and Nurse Practitioners) who all work together to provide you with the care you need, when you need it.  We recommend signing up for the patient portal called "MyChart".  Sign up information is provided on this After Visit Summary.  MyChart is used to connect with patients for Virtual Visits (Telemedicine).  Patients are able to view lab/test results, encounter notes, upcoming appointments, etc.  Non-urgent messages can be sent to your provider as well.   To learn more about what you can do with MyChart, go to [email or web address].    Your next appointment:   3 month(s)  The format for your next appointment:   In Person  Provider:   You may see [name], MD or one of the following Advanced Practice Providers on your designated Care Team:    Kathyrn Drown, NP

## 2020-01-30 LAB — BASIC METABOLIC PANEL
BUN/Creatinine Ratio: 19 (ref 10–24)
BUN: 17 mg/dL (ref 8–27)
CO2: 24 mmol/L (ref 20–29)
Calcium: 9.8 mg/dL (ref 8.6–10.2)
Chloride: 96 mmol/L (ref 96–106)
Creatinine, Ser: 0.89 mg/dL (ref 0.76–1.27)
GFR calc Af Amer: 100 mL/min/{1.73_m2} (ref >59)
GFR calc non Af Amer: 87 mL/min/{1.73_m2} (ref >59)
Glucose: 106 mg/dL — ABNORMAL HIGH (ref 65–99)
Potassium: 4.3 mmol/L (ref 3.5–5.2)
Sodium: 136 mmol/L (ref 134–144)

## 2020-01-30 LAB — PSA: Prostate Specific Ag, Serum: 1.2 ng/mL (ref 0.0–4.0)

## 2020-01-30 LAB — PRO B NATRIURETIC PEPTIDE: NT-Pro BNP: 51 pg/mL (ref 0–376)

## 2020-02-01 DIAGNOSIS — L814 Other melanin hyperpigmentation: Secondary | ICD-10-CM | POA: Diagnosis not present

## 2020-02-01 DIAGNOSIS — L570 Actinic keratosis: Secondary | ICD-10-CM | POA: Diagnosis not present

## 2020-02-01 DIAGNOSIS — D485 Neoplasm of uncertain behavior of skin: Secondary | ICD-10-CM | POA: Diagnosis not present

## 2020-02-01 DIAGNOSIS — L720 Epidermal cyst: Secondary | ICD-10-CM | POA: Diagnosis not present

## 2020-02-11 ENCOUNTER — Ambulatory Visit (INDEPENDENT_AMBULATORY_CARE_PROVIDER_SITE_OTHER): Payer: Medicare Other | Admitting: Pulmonary Disease

## 2020-02-11 ENCOUNTER — Other Ambulatory Visit: Payer: Self-pay

## 2020-02-11 ENCOUNTER — Encounter: Payer: Self-pay | Admitting: Pulmonary Disease

## 2020-02-11 VITALS — BP 122/70 | HR 75 | Temp 97.3°F | Ht 66.5 in | Wt 217.6 lb

## 2020-02-11 DIAGNOSIS — J449 Chronic obstructive pulmonary disease, unspecified: Secondary | ICD-10-CM

## 2020-02-11 DIAGNOSIS — J302 Other seasonal allergic rhinitis: Secondary | ICD-10-CM | POA: Diagnosis not present

## 2020-02-11 MED ORDER — TRELEGY ELLIPTA 200-62.5-25 MCG/INH IN AEPB: 1.0000 | INHALATION_SPRAY | Freq: Every day | RESPIRATORY_TRACT | 0 refills | Status: DC

## 2020-02-11 NOTE — Patient Instructions (Addendum)
Lets try the trelegy samples  If breathing is better or at least stable we can get a new prescription for this once you are out of anoro.   At next visit we can consider labs to see if other medicines will help.   Return to clinic in 3 months with Dr. [name] or sooner as needed

## 2020-02-12 NOTE — Progress Notes (Addendum)
Synopsis: Referred in January 2021 for CODP by [name], MD.  Subjective:   PATIENT ID: Cody Tate GENDER: male DOB: 03/18/1950, MRN: [id number]  Chief Complaint  Patient presents with  . Follow-up    3 month f/u for asthma/copd. States his breathing has been stable since last visit. Wants to discuss switching from Anoro and Pulmicort to Trelegy.     Cody Tate is a 70 year old gentleman who presents for follow-up of COPD.  New visit establishing care with me today.  He is accompanied today by his wife. No wheezing, cough.  Minimal morning sputum production, which is his baseline.  He uses Combivent 2-3 times per day with symptom benefit.  Overall his symptoms are stable on triple inhaled therapy.  He continues on montelukast and Flonase for allergic rhinitis.  For his GERD he did not have symptom benefit of pantoprazole, but has improved since being back on omeprazole from his PCP.  He is up-to-date on his flu shot and received his Covid booster. 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   Recent Cardiology visit reviewed.  Multiple prior pulmonary notes from Dr. Carlis Abbott reviewed.     OV 04/21/19: Cody Tate is a 70 year old gentleman with a history of COPD who presents for follow-up.  He is accompanied by his daughter today.  He has been doing well since his last visit.  He was switched from Symbicort to Anoro plus Pulmicort.  His shortness of breath is better and his coughing and wheezing resolved.  He is able to walk around his house more easily, but his chronic leg and back pain continue to limit his mobility.  He ambulates with a walker.  He has noticed his breathing has been slightly worse since the pollen has been out, but he is doing well overall.  He has continued Flonase and montelukast, but had to stop azelastine due to nasal dryness.  He takes Benadryl at night before bed.  He has chronic leg edema, left greater than right that has been ongoing since spine surgery many years ago.   He has significant GERD with reflux symptoms several days per week despite daily omeprazole.  He was previously on Nexium with better control, but his insurance is no longer covering it.   OV 02/24/19: Cody Tate is a 70 year old gentleman with a history of COPD and asthma overlap.  At his last visit he was started on Symbicort.  Although he had side effects to Spiriva, due to insurance coverage he was not able to switch this to Trelegy.  Spiriva causes hoarseness and throat irritation causing dysphagia.  No odynophagia.  He started Singulair at his last visit.  He continues to use Combivent as his rescue inhaler.  Since his last visit his breathing is significantly improved, he seldom wheezes, he is sleeping better, and his endurance is better.  He has chronic sinusitis for which he takes Afrin, Singulair, Flonase, azelastine, and Sudafed.  He is unable to take antihistamines due to urinary retention.  Azelastine was causing excessive nasal dryness, and he is decreased his dose to once daily dosing.  He is reluctant to stop Sudafed and Afrin due to rebound congestion.   OV 01/29/19: Cody Tate is a 70 year old gentleman who presents for evaluation of severe dyspnea on exertion and wheezing have been worsening for at least the past year.  He has chest congestion with a cough productive of thick yellow sputum.  He has trouble clearing sputum, but albuterol helps.  He was diagnosed with COPD  on spirometry several years ago, and has been on Spiriva once daily.  He is using albuterol multiple times a day, more than every 4 hours.  He is unsure if Spiriva is helping or making his symptoms worse; he sometimes feels more short of breath after using it.  He was treated with prednisone in August and doxycycline in December for acute exacerbation; the doxycycline in December did not improve his symptoms.  He is able to walk less than 1 block on flat ground.  He is able to walk around his house, but is unable to both  walk and talk at the same time.  He denies fever, chills, sweats.  He has chronic GERD, which is well controlled on omeprazole and dietary modifications.  He has chronic sinus issues with severe postnasal drip.  He has been taking Sudafed, Mucinex, and using Afrin nasal spray, which she has been on for several years.  He is using steroid nasal spray, which only helps for a few hours.  He has chronic aortic stenosis, and has a follow-up echocardiogram tomorrow.  He has noticed more lower extremity edema recently, especially on the left.  He still working on cutting down his sodium, but is diuretics were not increased.  He has gained 10 to 12 pounds recently, which he thinks is more due to diet than edema.  He quit smoking in 2002 after 35 years x 1.5 packs/day.  He has never felt like his breathing was normal.  In elementary school he was not able to keep up with the other kids.  He always had shortness of breath when he was doing his physical training during his [employer].  He was born full-term infant as far as he knows.  He had an uncle with COPD, but otherwise there is no family history of lung disease.   Past Medical History:  Diagnosis Date  . Arthritis   . BPH (benign prostatic hyperplasia)   . COPD (chronic obstructive pulmonary disease) ([place])   . Enlarged prostate   . GERD (gastroesophageal reflux disease)   . Glaucoma   . H/O hiatal hernia   . H/O wheezing    occ inhaler usage  . Heart murmur   . Hypertension   . Meningioma ([place])   . Pulmonary nodules    resolved on 2019 follow up CT     Family History  Problem Relation Age of Onset  . COPD Maternal Uncle      Past Surgical History:  Procedure Laterality Date  . BACK SURGERY  06,07,08   lam x3 cerv x 1  . CERVICAL FUSION    . CYSTOSCOPY     70 yrs old  . EYE SURGERY     bil  . KNEE ARTHROSCOPY  10   lft  . KNEE ARTHROSCOPY Left 07/23/2012   Procedure: LEFT MEDIAL MENISCAL DEBRIDEMENT AND CHONDROPLASTY;  Surgeon:  [name], MD;  Location: [facility];  Service: Orthopedics;  Laterality: Left;  . LUMBAR FUSION  02/22/2014   DR POOL  . PILONIDAL CYST EXCISION    . SINUS EXPLORATION    . SPINAL CORD STIMULATOR INSERTION N/A 01/16/2018   Procedure: LUMBAR SPINAL CORD STIMULATOR INSERTION;  Surgeon: [name], MD;  Location: [facility];  Service: Neurosurgery;  Laterality: N/A;  LUMBAR SPINAL CORD STIMULATOR INSERTION  . WRIST GANGLION EXCISION     rt  . WRIST SURGERY  09   cyst lft    Social History   Socioeconomic History  .  Marital status: Married    Spouse name: Not on file  . Number of children: Not on file  . Years of education: Not on file  . Highest education level: Not on file  Occupational History  . Not on file  Tobacco Use  . Smoking status: Former Smoker    Packs/day: 1.50    Years: 35.00    Pack years: 52.50    Types: Cigarettes    Quit date: 05/03/2000    Years since quitting: 19.8  . Smokeless tobacco: Never Used  . Tobacco comment: quit alcohol 73  Vaping Use  . Vaping Use: Never used  Substance and Sexual Activity  . Alcohol use: No  . Drug use: No  . Sexual activity: Not Currently  Other Topics Concern  . Not on file  Social History Narrative   08-20-17 Unable to ask abuse questions wife with him today.   Social Determinants of Health   Financial Resource Strain: Not on file  Food Insecurity: Not on file  Transportation Needs: Not on file  Physical Activity: Not on file  Stress: Not on file  Social Connections: Not on file  Intimate Partner Violence: Not on file     Allergies  Allergen Reactions  . Adhesive [Tape] Itching, Rash and Other (See Comments)    Occlusive tape and bandaids  . Other Itching, Rash and Other (See Comments)    VICRYL Sutures     Immunization History  Administered Date(s) Administered  . DTP 06/01/2001  . Fluad Quad(high Dose 65+) 09/16/2019  . Influenza Split 10/16/2010, 10/08/2012, 10/24/2012, 10/20/2013  . Influenza, High  Dose Seasonal PF 10/22/2014, 09/07/2015  . Influenza-Unspecified 10/03/2018  . PFIZER(Purple Top)SARS-COV-2 Vaccination 02/13/2019, 03/10/2019  . Pneumococcal Conjugate-13 10/22/2014  . Pneumococcal Polysaccharide-23 02/10/2015  . Tdap 09/18/2011, 05/17/2012  . Zoster 08/04/2010  . Zoster Recombinat (Shingrix) 08/18/2018    Outpatient Medications Prior to Visit  Medication Sig Dispense Refill  . Ascorbic Acid (VITAMIN C) 1000 MG tablet     . aspirin 81 MG tablet Take 81 mg by mouth daily.     . budesonide (PULMICORT FLEXHALER) 180 MCG/ACT inhaler Inhale 2 puffs into the lungs in the morning and at bedtime. 3 each 3  . Cholecalciferol (VITAMIN D3) 50 MCG (2000 UT) capsule     . diphenhydrAMINE (BENADRYL) 25 mg capsule Take 25 mg by mouth at bedtime as needed for sleep.    . fluticasone (FLONASE) 50 MCG/ACT nasal spray Place 2 sprays into both nostrils daily.     . furosemide (LASIX) 20 MG tablet Take 1 tablet (20 mg total) by mouth daily. 90 tablet 3  . gabapentin (NEURONTIN) 300 MG capsule Take 300 mg by mouth 3 (three) times daily.     Marland Kitchen guaifenesin (HUMIBID E) 400 MG TABS Take 400 mg by mouth every 4 (four) hours as needed (for congestion or mucus).     Marland Kitchen ibuprofen (ADVIL,MOTRIN) 200 MG tablet Take 600 mg by mouth every 6 (six) hours as needed for headache or moderate pain.     . Ipratropium-Albuterol (COMBIVENT RESPIMAT) 20-100 MCG/ACT AERS respimat Inhale 1 puff into the lungs every 6 (six) hours. 12 g 3  . latanoprost (XALATAN) 0.005 % ophthalmic solution Place 1 drop into both eyes at bedtime.     . magnesium hydroxide (MILK OF MAGNESIA) 400 MG/5ML suspension Take 15 mLs by mouth at bedtime.    . metoprolol succinate (TOPROL-XL) 100 MG 24 hr tablet Take 100 mg by mouth at bedtime. Take  with or immediately following a meal.    . montelukast (SINGULAIR) 10 MG tablet Take 1 tablet (10 mg total) by mouth at bedtime. 30 tablet 11  . Multiple Vitamins-Minerals (PRESERVISION AREDS 2+MULTI  VIT) CAPS     . omeprazole (PRILOSEC) 40 MG capsule Take 1 capsule (40 mg total) by mouth daily. 90 capsule 3  . Oxycodone HCl 10 MG TABS Take 10 mg by mouth every 4 (four) hours as needed.    Marland Kitchen oxymetazoline (AFRIN) 0.05 % nasal spray Place 2 sprays into both nostrils 2 (two) times daily as needed for congestion.    . Polyvinyl Alcohol-Povidone PF 1.4-0.6 % SOLN Place 2 drops into both eyes 2 (two) times daily as needed (for dry eyes).    . ramipril (ALTACE) 10 MG capsule Take 10 mg by mouth every morning.     . Simethicone 125 MG CAPS Take 125 mg by mouth 4 (four) times daily as needed (for gas).     . simvastatin (ZOCOR) 20 MG tablet Take 20 mg by mouth at bedtime.     Marland Kitchen spironolactone (ALDACTONE) 25 MG tablet Take 12.5 mg by mouth at bedtime.     . Tamsulosin HCl (FLOMAX) 0.4 MG CAPS Take 0.4 mg by mouth 2 (two) times daily.    Marland Kitchen umeclidinium-vilanterol (ANORO ELLIPTA) 62.5-25 MCG/INH AEPB Inhale 1 puff into the lungs daily. 180 each 3   No facility-administered medications prior to visit.       Objective:   Vitals:   02/11/20 1358  BP: 122/70  Pulse: 75  Temp: (!) 97.3 F (36.3 C)  TempSrc: Temporal  SpO2: 98%  Weight: 217 lb 9.6 oz (98.7 kg)  Height: 5' 6.5" (1.689 m)   98% on   RA BMI Readings from Last 3 Encounters:  02/11/20 34.60 kg/m  01/29/20 34.31 kg/m  10/28/19 34.75 kg/m   Wt Readings from Last 3 Encounters:  02/11/20 217 lb 9.6 oz (98.7 kg)  01/29/20 215 lb 12.8 oz (97.9 kg)  10/28/19 218 lb 9.6 oz (99.2 kg)    Physical Exam Vitals reviewed.  Constitutional:      General: He is not in acute distress.    Appearance: He is obese. He is not ill-appearing.  HENT:     Head: Normocephalic and atraumatic.  Eyes:     General: No scleral icterus. Cardiovascular:     Rate and Rhythm: Normal rate and regular rhythm.     Heart sounds: No murmur heard.   Pulmonary:     Comments: Breathing comfortably on room air, no conversational dyspnea.  Clear to  auscultation bilaterally. Abdominal:     General: There is no distension.  Musculoskeletal:        General: No deformity.     Cervical back: Neck supple.  Skin:    General: Skin is warm and dry.     Findings: No rash.  Neurological:     General: No focal deficit present.     Mental Status: He is alert.     Coordination: Coordination normal.  Psychiatric:        Mood and Affect: Mood normal.        Behavior: Behavior normal.      CBC    Component Value Date/Time   WBC 6.7 01/02/2018 1450   RBC 5.29 01/02/2018 1450   HGB 13.5 01/02/2018 1450   HCT 43.7 01/02/2018 1450   PLT 318 01/02/2018 1450   MCV 82.6 01/02/2018 1450   MCH 25.5 (L) 01/02/2018  1450   MCHC 30.9 01/02/2018 1450   RDW 13.7 01/02/2018 1450   LYMPHSABS 1.3 02/12/2014 1313   MONOABS 0.7 02/12/2014 1313   EOSABS 0.1 02/12/2014 1313   BASOSABS 0.1 02/12/2014 1313    CHEMISTRY No results for input(s): NA, K, CL, CO2, GLUCOSE, BUN, CREATININE, CALCIUM, MG, PHOS in the last 168 hours. Estimated Creatinine Clearance: 85.8 mL/min (by C-G formula based on SCr of 0.89 mg/dL).   Chest Imaging- films reviewed: CT chest 03/01/2017- moderate airway thickening, likely mild bronchiectasis in lower lobes.  No significant emphysema.  No mediastinal or hilar adenopathy.  Patulous esophagus.  01/30/19 bilateral lower extremity ultrasound-no DVT. Baker's cyst on the right.  Pulmonary Functions Testing Results: No flowsheet data found.  Per [facility] records 2018: spirometry 01/2013 FEV1/FVC equals 64%, FEV1 equals 72% mild COPD  Spirometry 2015: FVC 3.47 (83%)--> 3.59 (86%, +3%) FEV1 2.24 (72%)--> 2.37 (76%, +6%)  Ratio 65 Flow volume loop supports obstruction  Echocardiogram 01/10/2018: LVEF 65 to 70%.  Grade 1 diastolic dysfunction, elevated LVEDP.  Mild AS.  LA, RV, RA.  Trivial MR and TR, otherwise normal valves.  NM SPECT 01/10/2018: LVEF 67%, hyperdynamic function.  Abnormal baseline EKG, no ischemic changes during  test.  Normal myocardial perfusion; low risk study.    Assessment & Plan:     ICD-10-CM   1. Chronic obstructive pulmonary disease, unspecified COPD type ([place])  J44.9   2. Seasonal allergies  J30.2     COPD-  Question if he has an asthma overlap. 2 mild AE in the past year. GOLD D. -Continue triple inhaled therapy- trial Trelegy (previously Anoro/pulmocort) -Continue Combivent rescue inhaler every 4 hours as needed. -Continue allergic rhinosinusitis management with montelukast and Flonase nasal spray.  Previously had to stop azelastine due to nasal dryness. -Up-to-date on Covid, flu, pneumonia vaccines.    Allergic rhinosinusitis -Continue Flonase bilaterally once daily  -Continue Singulair daily.  No oral antihistamines due to urinary retention. -He continues to use Afrin, which he understands will cause rebound symptoms.  RTC in 3 months with Dr. [name].    Current Outpatient Medications:  .  Ascorbic Acid (VITAMIN C) 1000 MG tablet, , Disp: , Rfl:  .  aspirin 81 MG tablet, Take 81 mg by mouth daily. , Disp: , Rfl:  .  budesonide (PULMICORT FLEXHALER) 180 MCG/ACT inhaler, Inhale 2 puffs into the lungs in the morning and at bedtime., Disp: 3 each, Rfl: 3 .  Cholecalciferol (VITAMIN D3) 50 MCG (2000 UT) capsule, , Disp: , Rfl:  .  diphenhydrAMINE (BENADRYL) 25 mg capsule, Take 25 mg by mouth at bedtime as needed for sleep., Disp: , Rfl:  .  fluticasone (FLONASE) 50 MCG/ACT nasal spray, Place 2 sprays into both nostrils daily. , Disp: , Rfl:  .  Fluticasone-Umeclidin-Vilant (TRELEGY ELLIPTA) 200-62.5-25 MCG/INH AEPB, Inhale 1 puff into the lungs daily., Disp: 1 each, Rfl: 0 .  furosemide (LASIX) 20 MG tablet, Take 1 tablet (20 mg total) by mouth daily., Disp: 90 tablet, Rfl: 3 .  gabapentin (NEURONTIN) 300 MG capsule, Take 300 mg by mouth 3 (three) times daily. , Disp: , Rfl:  .  guaifenesin (HUMIBID E) 400 MG TABS, Take 400 mg by mouth every 4 (four) hours as needed (for  congestion or mucus). , Disp: , Rfl:  .  ibuprofen (ADVIL,MOTRIN) 200 MG tablet, Take 600 mg by mouth every 6 (six) hours as needed for headache or moderate pain. , Disp: , Rfl:  .  Ipratropium-Albuterol (COMBIVENT RESPIMAT)  20-100 MCG/ACT AERS respimat, Inhale 1 puff into the lungs every 6 (six) hours., Disp: 12 g, Rfl: 3 .  latanoprost (XALATAN) 0.005 % ophthalmic solution, Place 1 drop into both eyes at bedtime. , Disp: , Rfl:  .  magnesium hydroxide (MILK OF MAGNESIA) 400 MG/5ML suspension, Take 15 mLs by mouth at bedtime., Disp: , Rfl:  .  metoprolol succinate (TOPROL-XL) 100 MG 24 hr tablet, Take 100 mg by mouth at bedtime. Take with or immediately following a meal., Disp: , Rfl:  .  montelukast (SINGULAIR) 10 MG tablet, Take 1 tablet (10 mg total) by mouth at bedtime., Disp: 30 tablet, Rfl: 11 .  Multiple Vitamins-Minerals (PRESERVISION AREDS 2+MULTI VIT) CAPS, , Disp: , Rfl:  .  omeprazole (PRILOSEC) 40 MG capsule, Take 1 capsule (40 mg total) by mouth daily., Disp: 90 capsule, Rfl: 3 .  Oxycodone HCl 10 MG TABS, Take 10 mg by mouth every 4 (four) hours as needed., Disp: , Rfl:  .  oxymetazoline (AFRIN) 0.05 % nasal spray, Place 2 sprays into both nostrils 2 (two) times daily as needed for congestion., Disp: , Rfl:  .  Polyvinyl Alcohol-Povidone PF 1.4-0.6 % SOLN, Place 2 drops into both eyes 2 (two) times daily as needed (for dry eyes)., Disp: , Rfl:  .  ramipril (ALTACE) 10 MG capsule, Take 10 mg by mouth every morning. , Disp: , Rfl:  .  Simethicone 125 MG CAPS, Take 125 mg by mouth 4 (four) times daily as needed (for gas). , Disp: , Rfl:  .  simvastatin (ZOCOR) 20 MG tablet, Take 20 mg by mouth at bedtime. , Disp: , Rfl:  .  spironolactone (ALDACTONE) 25 MG tablet, Take 12.5 mg by mouth at bedtime. , Disp: , Rfl:  .  Tamsulosin HCl (FLOMAX) 0.4 MG CAPS, Take 0.4 mg by mouth 2 (two) times daily., Disp: , Rfl:  .  umeclidinium-vilanterol (ANORO ELLIPTA) 62.5-25 MCG/INH AEPB, Inhale 1 puff  into the lungs daily., Disp: 180 each, Rfl: 3    Lanier Clam, MD [facility] 02/17/2020 12:12 PM

## 2020-03-03 DIAGNOSIS — K219 Gastro-esophageal reflux disease without esophagitis: Secondary | ICD-10-CM | POA: Diagnosis not present

## 2020-03-03 DIAGNOSIS — D508 Other iron deficiency anemias: Secondary | ICD-10-CM | POA: Diagnosis not present

## 2020-03-03 DIAGNOSIS — I10 Essential (primary) hypertension: Secondary | ICD-10-CM | POA: Diagnosis not present

## 2020-03-03 DIAGNOSIS — H409 Unspecified glaucoma: Secondary | ICD-10-CM | POA: Diagnosis not present

## 2020-03-03 DIAGNOSIS — G8929 Other chronic pain: Secondary | ICD-10-CM | POA: Diagnosis not present

## 2020-03-03 DIAGNOSIS — N401 Enlarged prostate with lower urinary tract symptoms: Secondary | ICD-10-CM | POA: Diagnosis not present

## 2020-03-03 DIAGNOSIS — E7800 Pure hypercholesterolemia, unspecified: Secondary | ICD-10-CM | POA: Diagnosis not present

## 2020-03-03 DIAGNOSIS — J449 Chronic obstructive pulmonary disease, unspecified: Secondary | ICD-10-CM | POA: Diagnosis not present

## 2020-03-07 DIAGNOSIS — H353231 Exudative age-related macular degeneration, bilateral, with active choroidal neovascularization: Secondary | ICD-10-CM | POA: Diagnosis not present

## 2020-03-08 ENCOUNTER — Other Ambulatory Visit: Payer: Self-pay | Admitting: Critical Care Medicine

## 2020-03-21 DIAGNOSIS — Z0000 Encounter for general adult medical examination without abnormal findings: Secondary | ICD-10-CM | POA: Diagnosis not present

## 2020-03-21 DIAGNOSIS — H353231 Exudative age-related macular degeneration, bilateral, with active choroidal neovascularization: Secondary | ICD-10-CM | POA: Diagnosis not present

## 2020-03-21 DIAGNOSIS — J449 Chronic obstructive pulmonary disease, unspecified: Secondary | ICD-10-CM | POA: Diagnosis not present

## 2020-03-21 DIAGNOSIS — Z1389 Encounter for screening for other disorder: Secondary | ICD-10-CM | POA: Diagnosis not present

## 2020-03-21 DIAGNOSIS — N401 Enlarged prostate with lower urinary tract symptoms: Secondary | ICD-10-CM | POA: Diagnosis not present

## 2020-03-21 DIAGNOSIS — K912 Postsurgical malabsorption, not elsewhere classified: Secondary | ICD-10-CM | POA: Diagnosis not present

## 2020-03-21 DIAGNOSIS — H3530 Unspecified macular degeneration: Secondary | ICD-10-CM | POA: Diagnosis not present

## 2020-03-21 DIAGNOSIS — I10 Essential (primary) hypertension: Secondary | ICD-10-CM | POA: Diagnosis not present

## 2020-03-21 DIAGNOSIS — E7800 Pure hypercholesterolemia, unspecified: Secondary | ICD-10-CM | POA: Diagnosis not present

## 2020-03-21 DIAGNOSIS — Z79899 Other long term (current) drug therapy: Secondary | ICD-10-CM | POA: Diagnosis not present

## 2020-03-21 DIAGNOSIS — K9089 Other intestinal malabsorption: Secondary | ICD-10-CM | POA: Diagnosis not present

## 2020-03-21 DIAGNOSIS — R351 Nocturia: Secondary | ICD-10-CM | POA: Diagnosis not present

## 2020-03-21 DIAGNOSIS — Z1159 Encounter for screening for other viral diseases: Secondary | ICD-10-CM | POA: Diagnosis not present

## 2020-03-21 DIAGNOSIS — I700 Atherosclerosis of aorta: Secondary | ICD-10-CM | POA: Diagnosis not present

## 2020-03-22 ENCOUNTER — Telehealth: Payer: Self-pay | Admitting: Internal Medicine

## 2020-03-22 NOTE — Telephone Encounter (Signed)
Scheduled per 8/27 los. Mailing appt letter and calendar printout

## 2020-04-18 DIAGNOSIS — H353231 Exudative age-related macular degeneration, bilateral, with active choroidal neovascularization: Secondary | ICD-10-CM | POA: Diagnosis not present

## 2020-04-19 DIAGNOSIS — Z23 Encounter for immunization: Secondary | ICD-10-CM | POA: Diagnosis not present

## 2020-04-28 DIAGNOSIS — R3912 Poor urinary stream: Secondary | ICD-10-CM | POA: Diagnosis not present

## 2020-04-28 DIAGNOSIS — N403 Nodular prostate with lower urinary tract symptoms: Secondary | ICD-10-CM | POA: Diagnosis not present

## 2020-04-28 DIAGNOSIS — R350 Frequency of micturition: Secondary | ICD-10-CM | POA: Diagnosis not present

## 2020-04-28 DIAGNOSIS — R351 Nocturia: Secondary | ICD-10-CM | POA: Diagnosis not present

## 2020-05-10 DIAGNOSIS — I10 Essential (primary) hypertension: Secondary | ICD-10-CM | POA: Diagnosis not present

## 2020-05-10 DIAGNOSIS — J449 Chronic obstructive pulmonary disease, unspecified: Secondary | ICD-10-CM | POA: Diagnosis not present

## 2020-05-10 DIAGNOSIS — G8929 Other chronic pain: Secondary | ICD-10-CM | POA: Diagnosis not present

## 2020-05-10 DIAGNOSIS — E7800 Pure hypercholesterolemia, unspecified: Secondary | ICD-10-CM | POA: Diagnosis not present

## 2020-05-10 DIAGNOSIS — D508 Other iron deficiency anemias: Secondary | ICD-10-CM | POA: Diagnosis not present

## 2020-05-10 DIAGNOSIS — K219 Gastro-esophageal reflux disease without esophagitis: Secondary | ICD-10-CM | POA: Diagnosis not present

## 2020-05-10 DIAGNOSIS — H353131 Nonexudative age-related macular degeneration, bilateral, early dry stage: Secondary | ICD-10-CM | POA: Diagnosis not present

## 2020-05-10 DIAGNOSIS — N400 Enlarged prostate without lower urinary tract symptoms: Secondary | ICD-10-CM | POA: Diagnosis not present

## 2020-05-10 DIAGNOSIS — H409 Unspecified glaucoma: Secondary | ICD-10-CM | POA: Diagnosis not present

## 2020-05-10 DIAGNOSIS — N401 Enlarged prostate with lower urinary tract symptoms: Secondary | ICD-10-CM | POA: Diagnosis not present

## 2020-05-10 DIAGNOSIS — H401331 Pigmentary glaucoma, bilateral, mild stage: Secondary | ICD-10-CM | POA: Diagnosis not present

## 2020-05-18 NOTE — Progress Notes (Signed)
Cardiology Office Note   Date:  05/25/2020   ID:  Cody Tate, DOB 06/30/1950, MRN [id number]  PCP:  Cody Manes, MD  Cardiologist:   Cody Rouge, MD   No chief complaint on file.     History of Present Illness:  70 y.o. with history of HTN, former smoker with COPD. TTE 01/30/19 with normal EF mean AV gradient 8 peak 16 mmHg previous small LVOT gradient noted Has had weight gain, dyspnea and some edema RX diuretics BNP normal 01/29/20  No history of CAD with normal myovue 01/10/18 EF 15%  Had uncomplicated nerve stimulator implant with Dr Cody Tate for failed L1-5 fusion on 01/16/18 Hard to elevate legs due to back pain   Hard to urinate with prostatism and flomax not helpful He tells me his PSA has been good had biopsy with Dr Cody Tate [facility] last week and waiting for results  Discussed taking lasix latter in day to help with edema. He is wearing support hose Sees Dr Cody Tate [name]   Past Medical History:  Diagnosis Date  . Arthritis   . BPH (benign prostatic hyperplasia)   . COPD (chronic obstructive pulmonary disease) ([place])   . Enlarged prostate   . GERD (gastroesophageal reflux disease)   . Glaucoma   . H/O hiatal hernia   . H/O wheezing    occ inhaler usage  . Heart murmur   . Hypertension   . Meningioma ([place])   . Pulmonary nodules    resolved on 2019 follow up CT    Past Surgical History:  Procedure Laterality Date  . BACK SURGERY  06,07,08   lam x3 cerv x 1  . CERVICAL FUSION    . CYSTOSCOPY     70 yrs old  . EYE SURGERY     bil  . KNEE ARTHROSCOPY  10   lft  . KNEE ARTHROSCOPY Left 07/23/2012   Procedure: LEFT MEDIAL MENISCAL DEBRIDEMENT AND CHONDROPLASTY;  Surgeon: [name], MD;  Location: [facility];  Service: Orthopedics;  Laterality: Left;  . LUMBAR FUSION  02/22/2014   DR POOL  . PILONIDAL CYST EXCISION    . SINUS EXPLORATION    . SPINAL CORD STIMULATOR INSERTION N/A 01/16/2018   Procedure: LUMBAR SPINAL CORD  STIMULATOR INSERTION;  Surgeon: Cody Moore, MD;  Location: [facility];  Service: Neurosurgery;  Laterality: N/A;  LUMBAR SPINAL CORD STIMULATOR INSERTION  . WRIST GANGLION EXCISION     rt  . WRIST SURGERY  09   cyst lft     Current Outpatient Medications  Medication Sig Dispense Refill  . Ascorbic Acid (VITAMIN C) 1000 MG tablet     . aspirin 81 MG tablet Take 81 mg by mouth daily.     . budesonide (PULMICORT FLEXHALER) 180 MCG/ACT inhaler Inhale 2 puffs into the lungs in the morning and at bedtime. 3 each 3  . Cholecalciferol (VITAMIN D3) 50 MCG (2000 UT) capsule     . diphenhydrAMINE (BENADRYL) 25 mg capsule Take 25 mg by mouth at bedtime as needed for sleep.    . finasteride (PROSCAR) 5 MG tablet Take 5 mg by mouth daily.    . fluticasone (FLONASE) 50 MCG/ACT nasal spray Place 2 sprays into both nostrils daily.     . Fluticasone-Umeclidin-Vilant (TRELEGY ELLIPTA) 200-62.5-25 MCG/INH AEPB Inhale 1 puff into the lungs daily. 3 each 3  . furosemide (LASIX) 20 MG tablet Take 1 tablet (20 mg total) by mouth daily. 90 tablet 3  .  gabapentin (NEURONTIN) 300 MG capsule Take 300 mg by mouth 3 (three) times daily.     Marland Kitchen guaifenesin (HUMIBID E) 400 MG TABS Take 400 mg by mouth every 4 (four) hours as needed (for congestion or mucus).     Marland Kitchen ibuprofen (ADVIL,MOTRIN) 200 MG tablet Take 600 mg by mouth every 6 (six) hours as needed for headache or moderate pain.     . Ipratropium-Albuterol (COMBIVENT RESPIMAT) 20-100 MCG/ACT AERS respimat Inhale 1 puff into the lungs every 6 (six) hours. 12 g 3  . latanoprost (XALATAN) 0.005 % ophthalmic solution Place 1 drop into both eyes at bedtime.     . magnesium hydroxide (MILK OF MAGNESIA) 400 MG/5ML suspension Take 15 mLs by mouth at bedtime.    . metoprolol succinate (TOPROL-XL) 100 MG 24 hr tablet Take 100 mg by mouth at bedtime. Take with or immediately following a meal.    . montelukast (SINGULAIR) 10 MG tablet TAKE 1 TABLET BY MOUTH DAILY AT BEDTIME 90  tablet 1  . Multiple Vitamins-Minerals (PRESERVISION AREDS 2+MULTI VIT) CAPS     . omeprazole (PRILOSEC) 40 MG capsule Take 1 capsule (40 mg total) by mouth daily. 90 capsule 3  . Oxycodone HCl 10 MG TABS Take 10 mg by mouth every 4 (four) hours as needed.    Marland Kitchen oxymetazoline (AFRIN) 0.05 % nasal spray Place 2 sprays into both nostrils 2 (two) times daily as needed for congestion.    . Polyvinyl Alcohol-Povidone PF 1.4-0.6 % SOLN Place 2 drops into both eyes 2 (two) times daily as needed (for dry eyes).    . ramipril (ALTACE) 10 MG capsule Take 10 mg by mouth every morning.     . Simethicone 125 MG CAPS Take 125 mg by mouth 4 (four) times daily as needed (for gas).     . simvastatin (ZOCOR) 20 MG tablet Take 20 mg by mouth at bedtime.     Marland Kitchen spironolactone (ALDACTONE) 25 MG tablet Take 12.5 mg by mouth at bedtime.     . Tamsulosin HCl (FLOMAX) 0.4 MG CAPS Take 0.4 mg by mouth 2 (two) times daily.    Marland Kitchen umeclidinium-vilanterol (ANORO ELLIPTA) 62.5-25 MCG/INH AEPB Inhale 1 puff into the lungs daily. 180 each 3   No current facility-administered medications for this visit.    Allergies:   Adhesive [tape] and Other    Social History:  The patient  reports that he quit smoking about 20 years ago. His smoking use included cigarettes. He has a 52.50 pack-year smoking history. He has never used smokeless tobacco. He reports that he does not drink alcohol and does not use drugs.   Family History:  The patient's family history includes COPD in his maternal uncle.    ROS:  Please see the history of present illness.   Otherwise, review of systems are positive for none.   All other systems are reviewed and negative.    PHYSICAL EXAM: VS:  BP (!) 144/70   Pulse 76   Ht 5' 6.5" (1.689 m)   Wt 101.2 kg   BMI 35.45 kg/m  , BMI Body mass index is 35.45 kg/m. Affect appropriate Healthy:  appears stated age 76: normal Neck supple with no adenopathy JVP normal no bruits no thyromegaly Lungs  diffuse rhonchi and  wheezing and good diaphragmatic motion Heart:  S1/S2 SEM murmur, no rub, gallop or click PMI normal Abdomen: benighn, BS positve, no tenderness, no AAA no bruit.  No HSM or HJR Distal pulses intact with  no bruits Plus 2 bilateral edema Neuro non-focal Skin warm and dry Previous lumbar fusion with nerve stimulator in place     EKG:  See HPI  01/13/18 SR rate 77 RBBB lateral T wave changes 05/25/2020 SR rate 95 RBBB no changes    Recent Labs: 01/29/2020: BUN 17; Creatinine, Ser 0.89; NT-Pro BNP 51; Potassium 4.3; Sodium 136    Lipid Panel No results found for: CHOL, TRIG, HDL, CHOLHDL, VLDL, LDLCALC, LDLDIRECT    Wt Readings from Last 3 Encounters:  05/25/20 101.2 kg  02/11/20 98.7 kg  01/29/20 97.9 kg      Other studies Reviewed: Myovue 01/10/18 Echo 01/10/18  .    ASSESSMENT AND PLAN:  1. Abnormal ECG:  Lateral T wave changes ? From HTN non ischemic myovue 2020 2. COPD: no active wheezing former smoker f/u Dr Cody Tate Inhalers  3. HTN:  Well controlled.  Continue current medications and low sodium Dash type diet.   4. HLD  Continue statin labs with Dr Felipa Eth  5. AS:  Mild  mean gradient 8 peak 67mmhg TTE done 01/30/19  Will update no change in murmur  6. Back Pain: post nerve stimulator placement Dr Cody Tate 01/16/18 previous L1-5 fusion  7. Edema:  Dependant from obesity continue diuretics BNP normal  8. Prostate:  PSA normal continue flomax and proscar awaiting biopsy results    Current medicines are reviewed at length with the patient today.  The patient does not have concerns regarding medicines.  The following changes have been made:   None   Labs/ tests ordered today include:   Echo for AS   No orders of the defined types were placed in this encounter.    Disposition:   FU with cardiology in a year      Signed, Cody Rouge, MD  05/25/2020 10:09 AM    [address], [place], [place]   [address] Phone: [phone]; Fax: [phone]

## 2020-05-20 DIAGNOSIS — R350 Frequency of micturition: Secondary | ICD-10-CM | POA: Diagnosis not present

## 2020-05-20 DIAGNOSIS — N403 Nodular prostate with lower urinary tract symptoms: Secondary | ICD-10-CM | POA: Diagnosis not present

## 2020-05-20 DIAGNOSIS — R351 Nocturia: Secondary | ICD-10-CM | POA: Diagnosis not present

## 2020-05-20 DIAGNOSIS — C61 Malignant neoplasm of prostate: Secondary | ICD-10-CM | POA: Diagnosis not present

## 2020-05-24 ENCOUNTER — Telehealth: Payer: Self-pay | Admitting: Pulmonary Disease

## 2020-05-24 MED ORDER — TRELEGY ELLIPTA 200-62.5-25 MCG/INH IN AEPB: 1.0000 | INHALATION_SPRAY | Freq: Every day | RESPIRATORY_TRACT | 3 refills | Status: DC

## 2020-05-24 NOTE — Telephone Encounter (Signed)
Refill of the trelegy has been sent to the mail order pharmacy per pts request.

## 2020-05-25 ENCOUNTER — Ambulatory Visit (INDEPENDENT_AMBULATORY_CARE_PROVIDER_SITE_OTHER): Payer: Medicare Other | Admitting: Cardiovascular Disease

## 2020-05-25 ENCOUNTER — Other Ambulatory Visit: Payer: Self-pay

## 2020-05-25 VITALS — BP 144/70 | HR 76 | Ht 66.5 in | Wt 223.0 lb

## 2020-05-25 DIAGNOSIS — I350 Nonrheumatic aortic (valve) stenosis: Secondary | ICD-10-CM | POA: Diagnosis not present

## 2020-05-25 DIAGNOSIS — I10 Essential (primary) hypertension: Secondary | ICD-10-CM

## 2020-05-25 DIAGNOSIS — N429 Disorder of prostate, unspecified: Secondary | ICD-10-CM

## 2020-05-25 DIAGNOSIS — R0609 Other forms of dyspnea: Secondary | ICD-10-CM

## 2020-05-25 DIAGNOSIS — R0600 Dyspnea, unspecified: Secondary | ICD-10-CM | POA: Diagnosis not present

## 2020-05-25 DIAGNOSIS — E782 Mixed hyperlipidemia: Secondary | ICD-10-CM | POA: Diagnosis not present

## 2020-05-25 NOTE — Patient Instructions (Signed)
Medication Instructions:  NO CHANGES *If you need a refill on your cardiac medications before your next appointment, please call your pharmacy*   Lab Work: NONE If you have labs (blood work) drawn today and your tests are completely normal, you will receive your results only by: Marland Kitchen MyChart Message (if you have MyChart) OR . A paper copy in the mail If you have any lab test that is abnormal or we need to change your treatment, we will call you to review the results.   Testing/Procedures: Your physician has requested that you have an echocardiogram. Echocardiography is a painless test that uses sound waves to create images of your heart. It provides your doctor with information about the size and shape of your heart and how well your heart's chambers and valves are working. This procedure takes approximately one hour. There are no restrictions for this procedure.     Follow-Up: At [facility], you and your health needs are our priority.  As part of our continuing mission to provide you with exceptional heart care, we have created designated Provider Care Teams.  These Care Teams include your primary Cardiologist (physician) and Advanced Practice Providers (APPs -  Physician Assistants and Nurse Practitioners) who all work together to provide you with the care you need, when you need it.  We recommend signing up for the patient portal called "MyChart".  Sign up information is provided on this After Visit Summary.  MyChart is used to connect with patients for Virtual Visits (Telemedicine).  Patients are able to view lab/test results, encounter notes, upcoming appointments, etc.  Non-urgent messages can be sent to your provider as well.   To learn more about what you can do with MyChart, go to [email or web address].    Your next appointment:   12 month(s)  The format for your next appointment:   In Person  Provider:   [name], MD   Other Instructions NONE

## 2020-05-27 DIAGNOSIS — R351 Nocturia: Secondary | ICD-10-CM | POA: Diagnosis not present

## 2020-05-27 DIAGNOSIS — R3912 Poor urinary stream: Secondary | ICD-10-CM | POA: Diagnosis not present

## 2020-05-27 DIAGNOSIS — C61 Malignant neoplasm of prostate: Secondary | ICD-10-CM | POA: Diagnosis not present

## 2020-05-27 DIAGNOSIS — N401 Enlarged prostate with lower urinary tract symptoms: Secondary | ICD-10-CM | POA: Diagnosis not present

## 2020-05-31 ENCOUNTER — Other Ambulatory Visit (HOSPITAL_COMMUNITY): Payer: Self-pay | Admitting: Urology

## 2020-05-31 ENCOUNTER — Other Ambulatory Visit: Payer: Self-pay | Admitting: Urology

## 2020-05-31 DIAGNOSIS — H43813 Vitreous degeneration, bilateral: Secondary | ICD-10-CM | POA: Diagnosis not present

## 2020-05-31 DIAGNOSIS — H353231 Exudative age-related macular degeneration, bilateral, with active choroidal neovascularization: Secondary | ICD-10-CM | POA: Diagnosis not present

## 2020-05-31 DIAGNOSIS — C61 Malignant neoplasm of prostate: Secondary | ICD-10-CM

## 2020-05-31 DIAGNOSIS — H401132 Primary open-angle glaucoma, bilateral, moderate stage: Secondary | ICD-10-CM | POA: Diagnosis not present

## 2020-06-13 ENCOUNTER — Other Ambulatory Visit: Payer: Self-pay

## 2020-06-13 ENCOUNTER — Encounter (HOSPITAL_COMMUNITY)
Admission: RE | Admit: 2020-06-13 | Discharge: 2020-06-13 | Disposition: A | Discharge status: Home / Self Care | Payer: Medicare Other | Source: Ambulatory Visit | Attending: Urology | Admitting: Urology

## 2020-06-13 DIAGNOSIS — C61 Malignant neoplasm of prostate: Secondary | ICD-10-CM | POA: Insufficient documentation

## 2020-06-13 DIAGNOSIS — K5730 Diverticulosis of large intestine without perforation or abscess without bleeding: Secondary | ICD-10-CM | POA: Diagnosis not present

## 2020-06-13 DIAGNOSIS — M47815 Spondylosis without myelopathy or radiculopathy, thoracolumbar region: Secondary | ICD-10-CM | POA: Diagnosis not present

## 2020-06-13 DIAGNOSIS — M160 Bilateral primary osteoarthritis of hip: Secondary | ICD-10-CM | POA: Diagnosis not present

## 2020-06-13 MED ORDER — TECHNETIUM TC 99M MEDRONATE IV KIT
21.7000 | PACK | Freq: Once | INTRAVENOUS | Status: AC | PRN
  Administered 2020-06-13: 21.7 via INTRAVENOUS

## 2020-06-14 DIAGNOSIS — C61 Malignant neoplasm of prostate: Secondary | ICD-10-CM | POA: Diagnosis not present

## 2020-06-14 DIAGNOSIS — M47817 Spondylosis without myelopathy or radiculopathy, lumbosacral region: Secondary | ICD-10-CM | POA: Diagnosis not present

## 2020-06-14 DIAGNOSIS — M19032 Primary osteoarthritis, left wrist: Secondary | ICD-10-CM | POA: Diagnosis not present

## 2020-06-14 DIAGNOSIS — M19012 Primary osteoarthritis, left shoulder: Secondary | ICD-10-CM | POA: Diagnosis not present

## 2020-06-21 ENCOUNTER — Other Ambulatory Visit (HOSPITAL_COMMUNITY): Payer: Medicare Other

## 2020-06-22 ENCOUNTER — Other Ambulatory Visit: Payer: Self-pay

## 2020-06-22 ENCOUNTER — Encounter: Payer: Self-pay | Admitting: Pulmonary Disease

## 2020-06-22 ENCOUNTER — Ambulatory Visit (INDEPENDENT_AMBULATORY_CARE_PROVIDER_SITE_OTHER): Payer: Medicare Other | Admitting: Pulmonary Disease

## 2020-06-22 VITALS — BP 116/74 | HR 64 | Ht 66.5 in | Wt 217.0 lb

## 2020-06-22 DIAGNOSIS — J449 Chronic obstructive pulmonary disease, unspecified: Secondary | ICD-10-CM | POA: Diagnosis not present

## 2020-06-22 DIAGNOSIS — J309 Allergic rhinitis, unspecified: Secondary | ICD-10-CM

## 2020-06-22 NOTE — Progress Notes (Signed)
Synopsis: Referred in January 2021 for CODP by [name], MD.  Subjective:   PATIENT ID: Cody Tate GENDER: male DOB: 10-31-1950, MRN: [id number]  Chief Complaint  Patient presents with   Follow-up    4 mo f/u for COPD. States he has been doing well since last visit.     Cody Tate is a 70 year old gentleman who presents for follow-up of COPD.   He is accompanied today by his wife. No wheezing, cough.  Morning sputum production, which is his baseline.  Trelegy and 1 puff high dose Pulmicort once daily. Breathing stable.  He continues on montelukast, sudafed,and Flonase for allergic rhinitis. Continuing afrin and sinus rinses. Nasal congestion significant but slowly improving.   Recent Cardiology visit reviewed.         Past Medical History:  Diagnosis Date   Arthritis    BPH (benign prostatic hyperplasia)    COPD (chronic obstructive pulmonary disease) ([facility])    Enlarged prostate    GERD (gastroesophageal reflux disease)    Glaucoma    H/O hiatal hernia    H/O wheezing    occ inhaler usage   Heart murmur    Hypertension    Meningioma ([facility])    Pulmonary nodules    resolved on 2019 follow up CT     Family History  Problem Relation Age of Onset   COPD Maternal Uncle      Past Surgical History:  Procedure Laterality Date   BACK SURGERY  06,07,08   lam x3 cerv x 1   CERVICAL FUSION     CYSTOSCOPY     70 yrs old   EYE SURGERY     bil   KNEE ARTHROSCOPY  10   lft   KNEE ARTHROSCOPY Left 07/23/2012   Procedure: LEFT MEDIAL MENISCAL DEBRIDEMENT AND CHONDROPLASTY;  Surgeon: [name], MD;  Location: [facility];  Service: Orthopedics;  Laterality: Left;   LUMBAR FUSION  02/22/2014   DR POOL   PILONIDAL CYST EXCISION     SINUS EXPLORATION     SPINAL CORD STIMULATOR INSERTION N/A 01/16/2018   Procedure: LUMBAR SPINAL CORD STIMULATOR INSERTION;  Surgeon: [name], MD;  Location: [facility];  Service: Neurosurgery;  Laterality: N/A;  LUMBAR SPINAL CORD  STIMULATOR INSERTION   WRIST GANGLION EXCISION     rt   WRIST SURGERY  09   cyst lft    Social History   Socioeconomic History   Marital status: Married    Spouse name: Not on file   Number of children: Not on file   Years of education: Not on file   Highest education level: Not on file  Occupational History   Not on file  Tobacco Use   Smoking status: Former    Packs/day: 1.50    Years: 35.00    Pack years: 52.50    Types: Cigarettes    Quit date: 05/03/2000    Years since quitting: 20.1   Smokeless tobacco: Never   Tobacco comments:    quit alcohol 73  Vaping Use   Vaping Use: Never used  Substance and Sexual Activity   Alcohol use: No   Drug use: No   Sexual activity: Not Currently  Other Topics Concern   Not on file  Social History Narrative   08-20-17 Unable to ask abuse questions wife with him today.   Social Determinants of Health   Financial Resource Strain: Not on file  Food Insecurity: Not on file  Transportation Needs:  Not on file  Physical Activity: Not on file  Stress: Not on file  Social Connections: Not on file  Intimate Partner Violence: Not on file     Allergies  Allergen Reactions   Adhesive [Tape] Itching, Rash and Other (See Comments)    Occlusive tape and bandaids   Other Itching, Rash and Other (See Comments)    VICRYL Sutures     Immunization History  Administered Date(s) Administered   DTP 06/01/2001   Fluad Quad(high Dose 65+) 09/16/2019   Influenza Split 10/16/2010, 10/08/2012, 10/24/2012, 10/20/2013   Influenza, High Dose Seasonal PF 10/22/2014, 09/07/2015   Influenza-Unspecified 10/03/2018   PFIZER(Purple Top)SARS-COV-2 Vaccination 02/13/2019, 03/10/2019   Pneumococcal Conjugate-13 10/22/2014   Pneumococcal Polysaccharide-23 02/10/2015   Tdap 09/18/2011, 05/17/2012   Zoster Recombinat (Shingrix) 08/18/2018   Zoster, Live 08/04/2010    Outpatient Medications Prior to Visit  Medication Sig Dispense Refill   Ascorbic  Acid (VITAMIN C) 1000 MG tablet      aspirin 81 MG tablet Take 81 mg by mouth daily.      budesonide (PULMICORT FLEXHALER) 180 MCG/ACT inhaler Inhale 2 puffs into the lungs in the morning and at bedtime. 3 each 3   Cholecalciferol (VITAMIN D3) 50 MCG (2000 UT) capsule      diphenhydrAMINE (BENADRYL) 25 mg capsule Take 25 mg by mouth at bedtime as needed for sleep.     finasteride (PROSCAR) 5 MG tablet Take 5 mg by mouth daily.     fluticasone (FLONASE) 50 MCG/ACT nasal spray Place 2 sprays into both nostrils daily.      Fluticasone-Umeclidin-Vilant (TRELEGY ELLIPTA) 200-62.5-25 MCG/INH AEPB Inhale 1 puff into the lungs daily. 3 each 3   furosemide (LASIX) 20 MG tablet Take 1 tablet (20 mg total) by mouth daily. 90 tablet 3   gabapentin (NEURONTIN) 300 MG capsule Take 300 mg by mouth 3 (three) times daily.      guaifenesin (HUMIBID E) 400 MG TABS Take 400 mg by mouth every 4 (four) hours as needed (for congestion or mucus).      ibuprofen (ADVIL,MOTRIN) 200 MG tablet Take 600 mg by mouth every 6 (six) hours as needed for headache or moderate pain.      Ipratropium-Albuterol (COMBIVENT RESPIMAT) 20-100 MCG/ACT AERS respimat Inhale 1 puff into the lungs every 6 (six) hours. 12 g 3   latanoprost (XALATAN) 0.005 % ophthalmic solution Place 1 drop into both eyes at bedtime.      magnesium hydroxide (MILK OF MAGNESIA) 400 MG/5ML suspension Take 15 mLs by mouth at bedtime.     metoprolol succinate (TOPROL-XL) 100 MG 24 hr tablet Take 100 mg by mouth at bedtime. Take with or immediately following a meal.     montelukast (SINGULAIR) 10 MG tablet TAKE 1 TABLET BY MOUTH DAILY AT BEDTIME 90 tablet 1   Multiple Vitamins-Minerals (PRESERVISION AREDS 2+MULTI VIT) CAPS      omeprazole (PRILOSEC) 40 MG capsule Take 1 capsule (40 mg total) by mouth daily. 90 capsule 3   Oxycodone HCl 10 MG TABS Take 10 mg by mouth every 4 (four) hours as needed.     oxymetazoline (AFRIN) 0.05 % nasal spray Place 2 sprays into both  nostrils 2 (two) times daily as needed for congestion.     Polyvinyl Alcohol-Povidone PF 1.4-0.6 % SOLN Place 2 drops into both eyes 2 (two) times daily as needed (for dry eyes).     ramipril (ALTACE) 10 MG capsule Take 10 mg by mouth every morning.  Simethicone 125 MG CAPS Take 125 mg by mouth 4 (four) times daily as needed (for gas).      simvastatin (ZOCOR) 20 MG tablet Take 20 mg by mouth at bedtime.      spironolactone (ALDACTONE) 25 MG tablet Take 12.5 mg by mouth at bedtime.      Tamsulosin HCl (FLOMAX) 0.4 MG CAPS Take 0.4 mg by mouth 2 (two) times daily.     umeclidinium-vilanterol (ANORO ELLIPTA) 62.5-25 MCG/INH AEPB Inhale 1 puff into the lungs daily. 180 each 3   No facility-administered medications prior to visit.       Objective:   Vitals:   06/22/20 1521  BP: 116/74  Pulse: 64  SpO2: 99%  Weight: 217 lb (98.4 kg)  Height: 5' 6.5" (1.689 m)   99% on   RA BMI Readings from Last 3 Encounters:  06/22/20 34.50 kg/m  05/25/20 35.45 kg/m  02/11/20 34.60 kg/m   Wt Readings from Last 3 Encounters:  06/22/20 217 lb (98.4 kg)  05/25/20 223 lb (101.2 kg)  02/11/20 217 lb 9.6 oz (98.7 kg)    Physical Exam Gen: well appearing, in NAD Eyes: EOMI, no icterus Pulm: CTAB, NWOB on RA CV: RRR, no murmurs  CBC    Component Value Date/Time   WBC 6.7 01/02/2018 1450   RBC 5.29 01/02/2018 1450   HGB 13.5 01/02/2018 1450   HCT 43.7 01/02/2018 1450   PLT 318 01/02/2018 1450   MCV 82.6 01/02/2018 1450   MCH 25.5 (L) 01/02/2018 1450   MCHC 30.9 01/02/2018 1450   RDW 13.7 01/02/2018 1450   LYMPHSABS 1.3 02/12/2014 1313   MONOABS 0.7 02/12/2014 1313   EOSABS 0.1 02/12/2014 1313   BASOSABS 0.1 02/12/2014 1313    CHEMISTRY No results for input(s): NA, K, CL, CO2, GLUCOSE, BUN, CREATININE, CALCIUM, MG, PHOS in the last 168 hours. CrCl cannot be calculated (Patient's most recent lab result is older than the maximum 21 days allowed.).   Chest Imaging- films  reviewed: CT chest 03/01/2017- moderate airway thickening, likely mild bronchiectasis in lower lobes.  No significant emphysema.  No mediastinal or hilar adenopathy.  Patulous esophagus.  01/30/19 bilateral lower extremity ultrasound-no DVT. Baker's cyst on the right.  Pulmonary Functions Testing Results: No flowsheet data found.  Per [facility] records 2018: spirometry 01/2013 FEV1/FVC equals 64%, FEV1 equals 72% mild COPD  Spirometry 2015: FVC 3.47 (83%)--> 3.59 (86%, +3%) FEV1 2.24 (72%)--> 2.37 (76%, +6%)  Ratio 65 Flow volume loop supports obstruction  Echocardiogram 01/10/2018: LVEF 65 to 70%.  Grade 1 diastolic dysfunction, elevated LVEDP.  Mild AS.  LA, RV, RA.  Trivial MR and TR, otherwise normal valves.  NM SPECT 01/10/2018: LVEF 67%, hyperdynamic function.  Abnormal baseline EKG, no ischemic changes during test.  Normal myocardial perfusion; low risk study.    Assessment & Plan:     ICD-10-CM   1. Chronic obstructive pulmonary disease, unspecified COPD type ([place])  J44.9     2. Allergic rhinitis, unspecified seasonality, unspecified trigger  J30.9       COPD-  Question if he has an asthma overlap. 2 mild AE in the past year. GOLD D. -Continue triple inhaled therapy- Trelegy, taking 1 puff budesonide in AM, he will try to wean off -Continue Combivent rescue inhaler every 4 hours as needed. -Up-to-date on Covid, flu, pneumonia vaccines.    Allergic rhinosinusitis -Continue Flonase bilaterally once daily  -Continue Singulair daily, sudafed daily. --Continue sinus rinses -He continues to use Afrin, which he  understands will cause rebound symptoms.  RTC in 6 months with Dr. [name].     Cody Clam, MD [facility] 06/22/2020 4:30 PM

## 2020-06-22 NOTE — Patient Instructions (Signed)
Nice to see you!  Continue Trelegy once daily and Pulmicort 1 puff in the morning. Consider stopping the Pulmicort in a couple of weeks and see if you notice any change. If worse, resume the Pulmicort.   Let me know if the nasal congestion gets worse.  Return to clinic in 6 months or sooner as needed with Dr. [name]

## 2020-06-27 ENCOUNTER — Ambulatory Visit (HOSPITAL_COMMUNITY)
Admission: RE | Admit: 2020-06-27 | Discharge: 2020-06-27 | Disposition: A | Discharge status: Home / Self Care | Payer: Medicare Other | Source: Ambulatory Visit | Attending: Cardiovascular Disease | Admitting: Cardiovascular Disease

## 2020-06-27 ENCOUNTER — Other Ambulatory Visit: Payer: Self-pay

## 2020-06-27 DIAGNOSIS — I10 Essential (primary) hypertension: Secondary | ICD-10-CM | POA: Diagnosis not present

## 2020-06-27 DIAGNOSIS — I350 Nonrheumatic aortic (valve) stenosis: Secondary | ICD-10-CM

## 2020-06-27 DIAGNOSIS — J449 Chronic obstructive pulmonary disease, unspecified: Secondary | ICD-10-CM | POA: Insufficient documentation

## 2020-06-27 DIAGNOSIS — I080 Rheumatic disorders of both mitral and aortic valves: Secondary | ICD-10-CM | POA: Diagnosis not present

## 2020-06-27 LAB — ECHOCARDIOGRAM COMPLETE
Area-P 1/2: 3.31 cm2
S' Lateral: 3 cm

## 2020-06-29 DIAGNOSIS — D508 Other iron deficiency anemias: Secondary | ICD-10-CM | POA: Diagnosis not present

## 2020-06-29 DIAGNOSIS — G8929 Other chronic pain: Secondary | ICD-10-CM | POA: Diagnosis not present

## 2020-06-29 DIAGNOSIS — E7800 Pure hypercholesterolemia, unspecified: Secondary | ICD-10-CM | POA: Diagnosis not present

## 2020-06-29 DIAGNOSIS — J449 Chronic obstructive pulmonary disease, unspecified: Secondary | ICD-10-CM | POA: Diagnosis not present

## 2020-06-29 DIAGNOSIS — N400 Enlarged prostate without lower urinary tract symptoms: Secondary | ICD-10-CM | POA: Diagnosis not present

## 2020-06-29 DIAGNOSIS — K219 Gastro-esophageal reflux disease without esophagitis: Secondary | ICD-10-CM | POA: Diagnosis not present

## 2020-06-29 DIAGNOSIS — I10 Essential (primary) hypertension: Secondary | ICD-10-CM | POA: Diagnosis not present

## 2020-06-29 DIAGNOSIS — H409 Unspecified glaucoma: Secondary | ICD-10-CM | POA: Diagnosis not present

## 2020-06-29 DIAGNOSIS — N401 Enlarged prostate with lower urinary tract symptoms: Secondary | ICD-10-CM | POA: Diagnosis not present

## 2020-06-30 DIAGNOSIS — N403 Nodular prostate with lower urinary tract symptoms: Secondary | ICD-10-CM | POA: Diagnosis not present

## 2020-06-30 DIAGNOSIS — R3912 Poor urinary stream: Secondary | ICD-10-CM | POA: Diagnosis not present

## 2020-06-30 DIAGNOSIS — N401 Enlarged prostate with lower urinary tract symptoms: Secondary | ICD-10-CM | POA: Diagnosis not present

## 2020-06-30 DIAGNOSIS — C61 Malignant neoplasm of prostate: Secondary | ICD-10-CM | POA: Diagnosis not present

## 2020-07-05 NOTE — Progress Notes (Signed)
GU Location of Tumor / Histology:  Adenocarcinoma of the prostate  If Prostate Cancer, Gleason Score is (4 + 5), PSA (1.85 as of 03/21/20; 3.7 corrected value), and Prostate volume (65g)  Cody Tate presented with signs/symptoms of: BPH and LUTS what were being managed with tamsulosin twice a day and finasteride daily. Also reported a weak urinary stream and nocturia 2-3x a night.   Biopsies revealed:  05/20/2020   Past/Anticipated interventions by urology, if any:  05/20/2020 Dr. 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 Last seen by Dr. Mickeal Skinner on 08/28/2019: --He is clinically and radiographically stable today. Visual issues are not related to visual pathway dysfunction and likely ophthalmologic in nature. --He will continue to follow with [facility] for management of chronic back pain and spondylosis s/p SCS placement. --We ask that [name] return to clinic in 18 months following next CNS imaging, or sooner as needed.  Weight changes, if any: Patient denies  IPSS Score: 29 (severe) SHIM Score: 6 (sever ED)  Bowel/Bladder complaints, if any: Deals with ongoing constipation, but has a effective system to manage (takes milk of magnesia and sennekot, and occasional enema). Denies any new urinary concerns, but does report he would be unhappy if he had to live with his current urinary condition for the rest of his life  Nausea/Vomiting, if any: Patient denies  Pain issues, if any:  Denies any pelvic or abdominal pain, but continues to deal with chronic lower back and leg pain (ambulates with a rollater). Had a neurostimulator device placed in 2020 by Dr. [name]  SAFETY ISSUES: Prior radiation? Yes: 02/04/2013 15Gy to right tentorial meningioma Pacemaker/ICD?  No Possible current pregnancy? N/A Is the patient on methotrexate? No  Current Complaints / other details:  Patient has received the first 2 Pfizer vaccines

## 2020-07-06 ENCOUNTER — Telehealth: Payer: Self-pay | Admitting: Pulmonary Disease

## 2020-07-06 MED ORDER — MONTELUKAST SODIUM 10 MG PO TABS: 10.0000 mg | ORAL_TABLET | Freq: Every day | ORAL | 2 refills | Status: DC

## 2020-07-06 NOTE — Telephone Encounter (Signed)
Called and spoke with patient. He was asking to have a 90 day supply of montelukast to be send to [facility]. I advised him that I would go ahead and send this in for him.   Nothing further needed at time of call.

## 2020-07-12 DIAGNOSIS — C61 Malignant neoplasm of prostate: Secondary | ICD-10-CM | POA: Insufficient documentation

## 2020-07-12 NOTE — Progress Notes (Signed)
Radiation Oncology         [phone] ________________________________  Initial Outpatient Consultation  Name: Cody Tate MRN: [id number]  Date: 07/13/2020  DOB: 06-06-1950  OI:[id number], Christiane Ha, MD  Robley Fries, MD   REFERRING PHYSICIAN: Robley Fries, MD  DIAGNOSIS: 70 y.o. 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    ICD-10-CM   1. Malignant neoplasm of prostate ([place])  C61       HISTORY OF PRESENT ILLNESS: Cody Tate is a 70 y.o. male with a diagnosis of prostate cancer. He has been followed for BPH and LUTS requiring tamsulosin twice daily and finasteride.  He was noted to have a normal PSA of 1.85, corrected to 3.7, by his primary care physician, Dr. Felipa Eth.  However he was noted to have a prostate nodule on rectal exam.  Accordingly, he was referred for evaluation in urology by Dr. [name] on 04/28/20,  digital rectal examination was performed at that time revealing a firm hard left lobe of the prostate.  The patient proceeded to transrectal ultrasound with 12 biopsies of the prostate on 05/20/20.  The prostate volume measured 37 cc.  Out of 12 core biopsies, 4 were positive.  The maximum Gleason score was 4+5, and this was seen in the left base.   He had disease staging imaging with a CT A/P and bone scan on 06/15/2020.  On CT scan, there was an 8 mm sclerotic lesion along the right lower sacrum at S4, favored to be a benign bone island.  Otherwise, there was no lymphadenopathy or other concerning findings for metastatic disease.  On bone scan, there was limited evaluation of the sclerotic focus due to retained bladder activity obscuring the sacrum but given the low PSA, it was not highly concerning for a solitary focus of metastasis.  The patient reviewed the biopsy results with his urologist and he has kindly been referred today for discussion of potential radiation treatment  options.   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 Annette Stable and myself.  Currently followed by Dr. Mickeal Skinner  PAST MEDICAL HISTORY:  Past Medical History:  Diagnosis Date   Arthritis    BPH (benign prostatic hyperplasia)    COPD (chronic obstructive pulmonary disease) ([place])    Enlarged prostate    GERD (gastroesophageal reflux disease)    Glaucoma    H/O hiatal hernia    H/O wheezing    occ inhaler usage   Heart murmur    Hypertension    Meningioma ([place])    Pulmonary nodules    resolved on 2019 follow up CT      PAST SURGICAL HISTORY: Past Surgical History:  Procedure Laterality Date   BACK SURGERY  06,07,08   lam x3 cerv x 1   CERVICAL FUSION     CYSTOSCOPY     70 yrs old   EYE SURGERY     bil   KNEE ARTHROSCOPY  10   lft   KNEE ARTHROSCOPY Left 07/23/2012   Procedure: LEFT MEDIAL MENISCAL DEBRIDEMENT AND CHONDROPLASTY;  Surgeon: [name], MD;  Location: [facility];  Service: Orthopedics;  Laterality: Left;   LUMBAR FUSION  02/22/2014   DR POOL   PILONIDAL CYST EXCISION     SINUS EXPLORATION     SPINAL CORD STIMULATOR INSERTION N/A 01/16/2018   Procedure: LUMBAR SPINAL  CORD STIMULATOR INSERTION;  Surgeon: [name], MD;  Location: [facility];  Service: Neurosurgery;  Laterality: N/A;  LUMBAR SPINAL CORD STIMULATOR INSERTION   WRIST GANGLION EXCISION     rt   WRIST SURGERY  09   cyst lft    FAMILY HISTORY:  Family History  Problem Relation Age of Onset   COPD Maternal Uncle     SOCIAL HISTORY:  Social History   Socioeconomic History   Marital status: Married    Spouse name: Not on file   Number of children: Not on file   Years of education: Not on file   Highest education level: Not on file  Occupational History   Not on file  Tobacco Use   Smoking status: Former    Packs/day: 1.50    Years: 35.00    Pack years: 52.50    Types: Cigarettes    Quit date: 05/03/2000     Years since quitting: 20.2   Smokeless tobacco: Never   Tobacco comments:    quit alcohol 73  Vaping Use   Vaping Use: Never used  Substance and Sexual Activity   Alcohol use: No   Drug use: No   Sexual activity: Not Currently  Other Topics Concern   Not on file  Social History Narrative   08-20-17 Unable to ask abuse questions wife with him today.   Social Determinants of Health   Financial Resource Strain: Not on file  Food Insecurity: Not on file  Transportation Needs: Not on file  Physical Activity: Not on file  Stress: Not on file  Social Connections: Not on file  Intimate Partner Violence: Not on file    ALLERGIES: Adhesive [tape] and Other  MEDICATIONS:  Current Outpatient Medications  Medication Sig Dispense Refill   Ascorbic Acid (VITAMIN C) 1000 MG tablet      aspirin 81 MG tablet Take 81 mg by mouth daily.      budesonide (PULMICORT FLEXHALER) 180 MCG/ACT inhaler Inhale 2 puffs into the lungs in the morning and at bedtime. 3 each 3   Cholecalciferol (VITAMIN D3) 50 MCG (2000 UT) capsule      diphenhydrAMINE (BENADRYL) 25 mg capsule Take 25 mg by mouth at bedtime as needed for sleep.     finasteride (PROSCAR) 5 MG tablet Take 5 mg by mouth daily.     fluticasone (FLONASE) 50 MCG/ACT nasal spray Place 2 sprays into both nostrils daily.      Fluticasone-Umeclidin-Vilant (TRELEGY ELLIPTA) 200-62.5-25 MCG/INH AEPB Inhale 1 puff into the lungs daily. 3 each 3   furosemide (LASIX) 20 MG tablet Take 1 tablet (20 mg total) by mouth daily. 90 tablet 3   gabapentin (NEURONTIN) 300 MG capsule Take 300 mg by mouth 3 (three) times daily.      guaifenesin (HUMIBID E) 400 MG TABS Take 400 mg by mouth every 4 (four) hours as needed (for congestion or mucus).      ibuprofen (ADVIL,MOTRIN) 200 MG tablet Take 600 mg by mouth every 6 (six) hours as needed for headache or moderate pain.      Ipratropium-Albuterol (COMBIVENT RESPIMAT) 20-100 MCG/ACT AERS respimat Inhale 1 puff into the  lungs every 6 (six) hours. 12 g 3   latanoprost (XALATAN) 0.005 % ophthalmic solution Place 1 drop into both eyes at bedtime.      magnesium hydroxide (MILK OF MAGNESIA) 400 MG/5ML suspension Take 15 mLs by mouth at bedtime.     metoprolol succinate (TOPROL-XL) 100 MG 24 hr tablet Take  100 mg by mouth at bedtime. Take with or immediately following a meal.     montelukast (SINGULAIR) 10 MG tablet Take 1 tablet (10 mg total) by mouth at bedtime. 90 tablet 2   Multiple Vitamins-Minerals (PRESERVISION AREDS 2+MULTI VIT) CAPS      omeprazole (PRILOSEC) 40 MG capsule Take 1 capsule (40 mg total) by mouth daily. 90 capsule 3   Oxycodone HCl 10 MG TABS Take 10 mg by mouth every 4 (four) hours as needed.     oxymetazoline (AFRIN) 0.05 % nasal spray Place 2 sprays into both nostrils 2 (two) times daily as needed for congestion.     Polyvinyl Alcohol-Povidone PF 1.4-0.6 % SOLN Place 2 drops into both eyes 2 (two) times daily as needed (for dry eyes).     ramipril (ALTACE) 10 MG capsule Take 10 mg by mouth every morning.      Simethicone 125 MG CAPS Take 125 mg by mouth 4 (four) times daily as needed (for gas).      simvastatin (ZOCOR) 20 MG tablet Take 20 mg by mouth at bedtime.      spironolactone (ALDACTONE) 25 MG tablet Take 12.5 mg by mouth at bedtime.      Tamsulosin HCl (FLOMAX) 0.4 MG CAPS Take 0.4 mg by mouth 2 (two) times daily.     No current facility-administered medications for this encounter.    REVIEW OF SYSTEMS:  On review of systems, the patient reports that he is doing well overall. He denies any chest pain, shortness of breath, cough, fevers, chills, night sweats, unintended weight changes. He denies any bowel disturbances, and denies abdominal pain, nausea or vomiting. He denies any new musculoskeletal or joint aches or pains. His IPSS was 29, indicating severe urinary symptoms despite maximal medical therapy with Proscar daily and Flomax twice daily. His SHIM was 6, indicating he has  severe erectile dysfunction. A complete review of systems is obtained and is otherwise negative.    PHYSICAL EXAM:  Wt Readings from Last 3 Encounters:  06/22/20 217 lb (98.4 kg)  05/25/20 223 lb (101.2 kg)  02/11/20 217 lb 9.6 oz (98.7 kg)   Temp Readings from Last 3 Encounters:  02/11/20 (!) 97.3 F (36.3 C) (Temporal)  10/28/19 (!) 97.3 F (36.3 C) (Other (Comment))  08/28/19 98.1 F (36.7 C)   BP Readings from Last 3 Encounters:  06/22/20 116/74  05/25/20 (!) 144/70  02/11/20 122/70   Pulse Readings from Last 3 Encounters:  06/22/20 64  05/25/20 76  02/11/20 75    /10  In general this is a well appearing Caucasian male in no acute distress. He's alert and oriented x4 and appropriate throughout the examination. Cardiopulmonary assessment is negative for acute distress, and he exhibits normal effort.     KPS = 100  100 - Normal; no complaints; no evidence of disease. 90   - Able to carry on normal activity; minor signs or symptoms of disease. 80   - Normal activity with effort; some signs or symptoms of disease. 88   - Cares for self; unable to carry on normal activity or to do active work. 60   - Requires occasional assistance, but is able to care for most of his personal needs. 50   - Requires considerable assistance and frequent medical care. 59   - Disabled; requires special care and assistance. 33   - Severely disabled; hospital admission is indicated although death not imminent. 13   - Very sick; hospital admission necessary;  active supportive treatment necessary. 10   - Moribund; fatal processes progressing rapidly. 0     - Dead  Karnofsky DA, Abelmann Rowena, Craver LS and Burchenal [facility] [phone]) The use of the nitrogen mustards in the palliative treatment of carcinoma: with particular reference to bronchogenic carcinoma Cancer 1 [id number]  LABORATORY DATA:  Lab Results  Component Value Date   WBC 6.7 01/02/2018   HGB 13.5 01/02/2018   HCT 43.7 01/02/2018   MCV  82.6 01/02/2018   PLT 318 01/02/2018   Lab Results  Component Value Date   NA 136 01/29/2020   K 4.3 01/29/2020   CL 96 01/29/2020   CO2 24 01/29/2020   No results found for: ALT, AST, GGT, ALKPHOS, BILITOT   RADIOGRAPHY: NM Bone Scan Whole Body  Result Date: 06/15/2020 CLINICAL DATA:  Prostate cancer diagnosed 05/2020, previous back surgery, multifocal osteoarthritis EXAM: NUCLEAR MEDICINE WHOLE BODY BONE SCAN TECHNIQUE: Whole body anterior and posterior images were obtained approximately 3 hours after intravenous injection of radiopharmaceutical. RADIOPHARMACEUTICALS:  21.7 mCi Technetium-10m MDP IV COMPARISON:  06/13/2020 FINDINGS: Anterior and posterior whole body planar images demonstrate physiologic excretion of radiotracer within the kidneys and bladder. Degenerative type activity is seen at the bilateral shoulders, lower cervical spine, left sternoclavicular joint, bilateral wrists, and left knee. Increased radiotracer activity localizing to the thoracolumbar and lumbosacral junctions likely represents degenerative changes corresponding to disc space narrowing, vacuum phenomenon, and osteophyte formation identified on CT at the T12/L1 and L5/S1 levels. The retained activity within the bladder obscures the lower sacrum, in the region where the sclerotic focus was visualized at the S4 level on prior CT. No other areas of abnormal radiotracer activity to suggest an acute or destructive process. IMPRESSION: 1. Retained bladder activity obscures the sacrum, limiting evaluation of the sclerotic focus seen at the S4 level on recent CT. 2. Degenerative changes at the thoracolumbar and lumbosacral junctions corresponding to disc space narrowing and osteophyte formation on recent CT. 3. Multifocal osteoarthritis involving the shoulders, wrists, left knee. 4. No other abnormal activity to suggest bony metastatic disease. Electronically Signed   By: Randa Ngo M.D.   On: 06/15/2020 08:58    ECHOCARDIOGRAM COMPLETE  Result Date: 06/27/2020    ECHOCARDIOGRAM REPORT   Patient Name:   REEVES MUSICK Date of Exam: 06/27/2020 Medical Rec #:  [id number]        Height:       66.5 in Accession #:    [id number]       Weight:       217.0 lb Date of Birth:  05-Sep-1950         BSA:          2.082 m Patient Age:    69 years         BP:           116/74 mmHg Patient Gender: M                HR:           80 bpm. Exam Location:  Outpatient Procedure: 2D Echo Indications:     Aortic Stenosis I35.0  History:         Patient has prior history of Echocardiogram examinations, most                  recent 01/30/2019. COPD; Risk Factors:Hypertension.  Sonographer:     Mikki Santee RDCS (AE) Referring Phys:  [place] Diagnosing Phys: Kirk Ruths  MD IMPRESSIONS  1. Left ventricular ejection fraction, by estimation, is >75%. The left ventricle has hyperdynamic function. The left ventricle has no regional wall motion abnormalities. There is mild left ventricular hypertrophy. Left ventricular diastolic parameters are consistent with Grade I diastolic dysfunction (impaired relaxation). Elevated left atrial pressure.  2. Right ventricular systolic function is normal. The right ventricular size is normal.  3. The mitral valve is normal in structure. Mild mitral valve regurgitation. No evidence of mitral stenosis.  4. The aortic valve is tricuspid. Aortic valve regurgitation is not visualized. Mild aortic valve sclerosis is present, with no evidence of aortic valve stenosis.  5. The inferior vena cava is normal in size with greater than 50% respiratory variability, suggesting right atrial pressure of 3 mmHg. FINDINGS  Left Ventricle: Left ventricular ejection fraction, by estimation, is >75%. The left ventricle has hyperdynamic function. The left ventricle has no regional wall motion abnormalities. The left ventricular internal cavity size was normal in size. There is mild left ventricular hypertrophy. Left  ventricular diastolic parameters are consistent with Grade I diastolic dysfunction (impaired relaxation). Elevated left atrial pressure. Right Ventricle: The right ventricular size is normal. Right ventricular systolic function is normal. Left Atrium: Left atrial size was normal in size. Right Atrium: Right atrial size was normal in size. Pericardium: There is no evidence of pericardial effusion. Mitral Valve: The mitral valve is normal in structure. Mild mitral valve regurgitation. No evidence of mitral valve stenosis. Tricuspid Valve: The tricuspid valve is normal in structure. Tricuspid valve regurgitation is trivial. No evidence of tricuspid stenosis. Aortic Valve: The aortic valve is tricuspid. Aortic valve regurgitation is not visualized. Mild aortic valve sclerosis is present, with no evidence of aortic valve stenosis. Pulmonic Valve: The pulmonic valve was normal in structure. Pulmonic valve regurgitation is not visualized. No evidence of pulmonic stenosis. Aorta: The aortic root is normal in size and structure. Venous: The inferior vena cava is normal in size with greater than 50% respiratory variability, suggesting right atrial pressure of 3 mmHg. IAS/Shunts: No atrial level shunt detected by color flow Doppler.  LEFT VENTRICLE PLAX 2D LVIDd:         4.55 cm  Diastology LVIDs:         3.00 cm  LV e' medial:    5.77 cm/s LV PW:         1.20 cm  LV E/e' medial:  21.1 LV IVS:        1.30 cm  LV e' lateral:   6.53 cm/s LVOT diam:     2.10 cm  LV E/e' lateral: 18.7 LV SV:         111 LV SV Index:   53 LVOT Area:     3.46 cm  RIGHT VENTRICLE TAPSE (M-mode): 1.9 cm LEFT ATRIUM             Index       RIGHT ATRIUM           Index LA diam:        3.20 cm 1.54 cm/m  RA Area:     13.30 cm LA Vol (A2C):   47.1 ml 22.62 ml/m RA Volume:   24.40 ml  11.72 ml/m LA Vol (A4C):   50.9 ml 24.45 ml/m LA Biplane Vol: 49.7 ml 23.87 ml/m  AORTIC VALVE LVOT Vmax:   160.00 cm/s LVOT Vmean:  102.000 cm/s LVOT VTI:    0.320 m   AORTA Ao Root diam: 3.20 cm MITRAL VALVE MV  Area (PHT): 3.31 cm     SHUNTS MV Decel Time: 229 msec     Systemic VTI:  0.32 m MV E velocity: 122.00 cm/s  Systemic Diam: 2.10 cm MV A velocity: 141.00 cm/s MV E/A ratio:  0.87 Kirk Ruths MD Electronically signed by Kirk Ruths MD Signature Date/Time: 06/27/2020/2:43:15 PM    Final (Updated)       IMPRESSION/PLAN: 1. 70 y.o. 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 We discussed the patient's workup and outlined the nature of prostate cancer in this setting. The patient's T stage, Gleason's score, and PSA put him into the high risk group. Accordingly, he is eligible for a variety of potential treatment options including prostatectomy or LT-ADT in combination with either 8 weeks of external radiation, or 5 weeks of external radiation with an upfront brachytherapy boost.  He is not felt to be a good surgical candidate given his COPD.  We discussed the available radiation techniques, and focused on the details and logistics of delivery. The patient is not an ideal candidate for brachytherapy boost in light of his severe LUTS despite maximal medical therapy with Proscar and Flomax. We discussed and outlined the risks, benefits, short and long-term effects associated with external beam radiotherapy and compared and contrasted these with prostatectomy. We discussed the role of SpaceOAR gel in reducing the rectal toxicity associated with radiotherapy. We also detailed the role of ADT in the treatment of high risk prostate cancer and outlined the associated side effects that could be expected with this therapy. He appears to have a good understanding of his disease and our treatment recommendations which are of curative intent.  He was encouraged to ask questions that were answered to his stated satisfaction.  At the conclusion of our conversation, the patient is interested in moving  forward with LT-ADT concurrent with 8 weeks of daily external beam radiotherapy.  We will share our discussion with Dr. [name] to make arrangements to initiate ADT now and a follow-up visit with her to further discuss the possibility of channel TURP prior to starting radiotherapy to help better manage his LUTS.  We would plan to start his daily radiotherapy a minimum of 8 weeks from the start of ADT and pending timing of TURP, would need to allow at least 6 weeks recovery from this procedure prior to starting radiation.  He will also need fiducial markers and SpaceOAR gel placement prior to CT simulation, in preparation for radiotherapy.  We enjoyed meeting him today and look forward to continuing to participate in his care.  He knows that he is welcome to call at anytime with any questions or concerns in the interim.   We personally spent 75 minutes in this encounter including chart review, reviewing radiological studies, meeting face-to-face with the patient, entering orders and completing documentation.     Nicholos Johns, PA-C    Tyler Pita, MD  [facility] Direct Dial: [phone]  Fax: [phone] [email or web address]  Skype  [email or web address]

## 2020-07-13 ENCOUNTER — Ambulatory Visit
Admission: RE | Admit: 2020-07-13 | Discharge: 2020-07-13 | Disposition: A | Discharge status: Home / Self Care | Payer: Medicare Other | Source: Ambulatory Visit | Attending: Radiation Oncology | Admitting: Radiation Oncology

## 2020-07-13 ENCOUNTER — Other Ambulatory Visit: Payer: Self-pay

## 2020-07-13 VITALS — BP 155/70 | HR 87 | Temp 97.1°F | Resp 18 | Ht 66.5 in | Wt 217.2 lb

## 2020-07-13 DIAGNOSIS — C61 Malignant neoplasm of prostate: Secondary | ICD-10-CM | POA: Insufficient documentation

## 2020-07-14 ENCOUNTER — Telehealth: Payer: Self-pay | Admitting: *Deleted

## 2020-07-14 NOTE — Telephone Encounter (Signed)
CALLED PATIENT TO INFORM OF APPT. WITH DR. PACE ON 08-04-20- ARRIVAL TIME- 9:15 AM FOR ADT AND TO DISCUSS CHANNEL TURP, SPOKE WITH PATIENT'S WIFE- Cody Tate AND SHE IS AWARE OF THIS APPT.

## 2020-07-17 DIAGNOSIS — Z20822 Contact with and (suspected) exposure to covid-19: Secondary | ICD-10-CM | POA: Diagnosis not present

## 2020-07-19 DIAGNOSIS — H401132 Primary open-angle glaucoma, bilateral, moderate stage: Secondary | ICD-10-CM | POA: Diagnosis not present

## 2020-07-19 DIAGNOSIS — H353231 Exudative age-related macular degeneration, bilateral, with active choroidal neovascularization: Secondary | ICD-10-CM | POA: Diagnosis not present

## 2020-07-19 DIAGNOSIS — H43813 Vitreous degeneration, bilateral: Secondary | ICD-10-CM | POA: Diagnosis not present

## 2020-08-04 DIAGNOSIS — R3912 Poor urinary stream: Secondary | ICD-10-CM | POA: Diagnosis not present

## 2020-08-04 DIAGNOSIS — R350 Frequency of micturition: Secondary | ICD-10-CM | POA: Diagnosis not present

## 2020-08-04 DIAGNOSIS — C61 Malignant neoplasm of prostate: Secondary | ICD-10-CM | POA: Diagnosis not present

## 2020-08-04 DIAGNOSIS — N401 Enlarged prostate with lower urinary tract symptoms: Secondary | ICD-10-CM | POA: Diagnosis not present

## 2020-08-09 ENCOUNTER — Other Ambulatory Visit: Payer: Self-pay | Admitting: Urology

## 2020-08-09 ENCOUNTER — Other Ambulatory Visit (HOSPITAL_COMMUNITY): Payer: Self-pay | Admitting: Urology

## 2020-08-09 DIAGNOSIS — Z8546 Personal history of malignant neoplasm of prostate: Secondary | ICD-10-CM

## 2020-08-09 DIAGNOSIS — C61 Malignant neoplasm of prostate: Secondary | ICD-10-CM

## 2020-08-15 NOTE — Progress Notes (Signed)
Sent message, via epic in basket, requesting orders in epic from surgeon.  

## 2020-08-16 DIAGNOSIS — I10 Essential (primary) hypertension: Secondary | ICD-10-CM | POA: Diagnosis not present

## 2020-08-16 DIAGNOSIS — J449 Chronic obstructive pulmonary disease, unspecified: Secondary | ICD-10-CM | POA: Diagnosis not present

## 2020-08-16 DIAGNOSIS — C61 Malignant neoplasm of prostate: Secondary | ICD-10-CM | POA: Diagnosis not present

## 2020-08-19 NOTE — Progress Notes (Signed)
DUE TO COVID-19 ONLY ONE VISITOR IS ALLOWED TO COME WITH YOU AND STAY IN THE WAITING ROOM ONLY DURING PRE OP AND PROCEDURE DAY OF SURGERY. THE 1 VISITOR  MAY VISIT WITH YOU AFTER SURGERY IN YOUR PRIVATE ROOM DURING VISITING HOURS ONLY!  YOU NEED TO HAVE A COVID 19 TEST ON___9/02/2020 ____ '@_______'$ , THIS TEST MUST BE DONE BEFORE SURGERY,                Cody Tate  08/19/2020   Your procedure is scheduled on:  09/06/2020   Report to [facility] Main  Entrance   Report to admitting at    [phone]     Call this number if you have problems the morning of surgery [phone]    Remember: Do not eat food , candy gum or mints :After Midnight. You may have clear liquids from midnight until  0730am    CLEAR LIQUID DIET   Foods Allowed                                                                       Coffee and tea, regular and decaf                              Plain Jell-O any favor except red or purple                                            Fruit ices (not with fruit pulp)                                      Iced Popsicles                                     Carbonated beverages, regular and diet                                    Cranberry, grape and apple juices Sports drinks like Gatorade Lightly seasoned clear broth or consume(fat free) Sugar, honey syrup   _____________________________________________________________________    BRUSH YOUR TEETH MORNING OF SURGERY AND RINSE YOUR MOUTH OUT, NO CHEWING GUM CANDY OR MINTS.     Take these medicines the morning of surgery with A SIP OF WATER: proscar, inhalers as usual and bring, gabapentin, prilosec, tamsulosin  DO NOT TAKE ANY DIABETIC MEDICATIONS DAY OF YOUR SURGERY                               You may not have any metal on your body including hair pins and              piercings  Do not wear jewelry, make-up, lotions, powders or perfumes, deodorant             Do  not wear nail polish on your fingernails.   Do not shave  48 hours prior to surgery.              Men may shave face and neck.   Do not bring valuables to the hospital. [name].  Contacts, dentures or bridgework may not be worn into surgery.  Leave suitcase in the car. After surgery it may be brought to your room.     Patients discharged the day of surgery will not be allowed to drive home. IF YOU ARE HAVING SURGERY AND GOING HOME THE SAME DAY, YOU MUST HAVE AN ADULT TO DRIVE YOU HOME AND BE WITH YOU FOR 24 HOURS. YOU MAY GO HOME BY TAXI OR UBER OR ORTHERWISE, BUT AN ADULT MUST ACCOMPANY YOU HOME AND STAY WITH YOU FOR 24 HOURS.  Name and phone number of your driver:  Special Instructions: N/A              Please read over the following fact sheets you were given: _____________________________________________________________________  [facility] - Preparing for Surgery Before surgery, you can play an important role.  Because skin is not sterile, your skin needs to be as free of germs as possible.  You can reduce the number of germs on your skin by washing with CHG (chlorahexidine gluconate) soap before surgery.  CHG is an antiseptic cleaner which kills germs and bonds with the skin to continue killing germs even after washing. Please DO NOT use if you have an allergy to CHG or antibacterial soaps.  If your skin becomes reddened/irritated stop using the CHG and inform your nurse when you arrive at Short Stay. Do not shave (including legs and underarms) for at least 48 hours prior to the first CHG shower.  You may shave your face/neck. Please follow these instructions carefully:  1.  Shower with CHG Soap the night before surgery and the  morning of Surgery.  2.  If you choose to wash your hair, wash your hair first as usual with your  normal  shampoo.  3.  After you shampoo, rinse your hair and body thoroughly to remove the  shampoo.                           4.  Use CHG as you would any  other liquid soap.  You can apply chg directly  to the skin and wash                       Gently with a scrungie or clean washcloth.  5.  Apply the CHG Soap to your body ONLY FROM THE NECK DOWN.   Do not use on face/ open                           Wound or open sores. Avoid contact with eyes, ears mouth and genitals (private parts).                       Wash face,  Genitals (private parts) with your normal soap.             6.  Wash thoroughly, paying special attention to the area where your surgery  will be performed.  7.  Thoroughly rinse your body with  warm water from the neck down.  8.  DO NOT shower/wash with your normal soap after using and rinsing off  the CHG Soap.                9.  Pat yourself dry with a clean towel.            10.  Wear clean pajamas.            11.  Place clean sheets on your bed the night of your first shower and do not  sleep with pets. Day of Surgery : Do not apply any lotions/deodorants the morning of surgery.  Please wear clean clothes to the hospital/surgery center.  FAILURE TO FOLLOW THESE INSTRUCTIONS MAY RESULT IN THE CANCELLATION OF YOUR SURGERY PATIENT SIGNATURE_________________________________  NURSE SIGNATURE__________________________________  ________________________________________________________________________

## 2020-08-23 ENCOUNTER — Other Ambulatory Visit: Payer: Self-pay

## 2020-08-23 ENCOUNTER — Encounter (HOSPITAL_COMMUNITY): Payer: Self-pay

## 2020-08-23 ENCOUNTER — Encounter (HOSPITAL_COMMUNITY)
Admission: RE | Admit: 2020-08-23 | Discharge: 2020-08-23 | Disposition: A | Discharge status: Home / Self Care | Payer: Medicare Other | Source: Ambulatory Visit | Attending: Urology | Admitting: Urology

## 2020-08-23 DIAGNOSIS — Z01818 Encounter for other preprocedural examination: Secondary | ICD-10-CM | POA: Insufficient documentation

## 2020-08-23 HISTORY — DX: Malignant (primary) neoplasm, unspecified: C80.1

## 2020-08-23 HISTORY — DX: Dyspnea, unspecified: R06.00

## 2020-08-23 HISTORY — DX: Other specified postprocedural states: Z98.890

## 2020-08-23 HISTORY — DX: Other specified postprocedural states: R11.2

## 2020-08-23 HISTORY — DX: Nausea with vomiting, unspecified: R11.2

## 2020-08-23 LAB — BASIC METABOLIC PANEL
Anion gap: 9 (ref 5–15)
BUN: 24 mg/dL — ABNORMAL HIGH (ref 8–23)
CO2: 28 mmol/L (ref 22–32)
Calcium: 9.1 mg/dL (ref 8.9–10.3)
Chloride: 99 mmol/L (ref 98–111)
Creatinine, Ser: 0.91 mg/dL (ref 0.61–1.24)
GFR, Estimated: >60 mL/min (ref >60)
Glucose, Bld: 104 mg/dL — ABNORMAL HIGH (ref 70–99)
Potassium: 4.3 mmol/L (ref 3.5–5.1)
Sodium: 136 mmol/L (ref 135–145)

## 2020-08-23 LAB — CBC
HCT: 46.7 % (ref 39.0–52.0)
Hemoglobin: 15.3 g/dL (ref 13.0–17.0)
MCH: 29.6 pg (ref 26.0–34.0)
MCHC: 32.8 g/dL (ref 30.0–36.0)
MCV: 90.3 fL (ref 80.0–100.0)
Platelets: 309 10*3/uL (ref 150–400)
RBC: 5.17 MIL/uL (ref 4.22–5.81)
RDW: 12.5 % (ref 11.5–15.5)
WBC: 7.9 10*3/uL (ref 4.0–10.5)
nRBC: 0 % (ref 0.0–0.2)

## 2020-08-23 NOTE — Progress Notes (Addendum)
Anesthesia Review:  PCP: DR hal Stoneking - pt  reports clearance received from PCP  Requested clearance and LOV note. Clearance in note dated 08/16/20 on chart  Cardiologist : DR [name] - [place] 05/25/20 Pulm- 06/22/20- DR Hunsucker  Chest x-ray : EKG : 01/29/20  Echo : 06/27/20  Stress test:2020  Cardiac Cath :  Activity level:  Sleep Study/ CPAP : Fasting Blood Sugar :      / Checks Blood Sugar -- times a day:   Blood Thinner/ Instructions /Last Dose: ASA / Instructions/ Last Dose :   Covid test- 09/02/20

## 2020-08-24 DIAGNOSIS — K219 Gastro-esophageal reflux disease without esophagitis: Secondary | ICD-10-CM | POA: Diagnosis not present

## 2020-08-24 DIAGNOSIS — C61 Malignant neoplasm of prostate: Secondary | ICD-10-CM | POA: Diagnosis not present

## 2020-08-24 DIAGNOSIS — D508 Other iron deficiency anemias: Secondary | ICD-10-CM | POA: Diagnosis not present

## 2020-08-24 DIAGNOSIS — E7800 Pure hypercholesterolemia, unspecified: Secondary | ICD-10-CM | POA: Diagnosis not present

## 2020-08-24 DIAGNOSIS — N401 Enlarged prostate with lower urinary tract symptoms: Secondary | ICD-10-CM | POA: Diagnosis not present

## 2020-08-24 DIAGNOSIS — J449 Chronic obstructive pulmonary disease, unspecified: Secondary | ICD-10-CM | POA: Diagnosis not present

## 2020-08-24 DIAGNOSIS — I10 Essential (primary) hypertension: Secondary | ICD-10-CM | POA: Diagnosis not present

## 2020-08-24 DIAGNOSIS — N400 Enlarged prostate without lower urinary tract symptoms: Secondary | ICD-10-CM | POA: Diagnosis not present

## 2020-08-24 DIAGNOSIS — G8929 Other chronic pain: Secondary | ICD-10-CM | POA: Diagnosis not present

## 2020-08-24 DIAGNOSIS — H409 Unspecified glaucoma: Secondary | ICD-10-CM | POA: Diagnosis not present

## 2020-08-29 DIAGNOSIS — H353231 Exudative age-related macular degeneration, bilateral, with active choroidal neovascularization: Secondary | ICD-10-CM | POA: Diagnosis not present

## 2020-09-02 ENCOUNTER — Other Ambulatory Visit: Payer: Self-pay | Admitting: Urology

## 2020-09-03 LAB — SARS CORONAVIRUS 2 (TAT 6-24 HRS): SARS Coronavirus 2: NEGATIVE

## 2020-09-06 ENCOUNTER — Observation Stay (HOSPITAL_COMMUNITY)
Admission: RE | Admit: 2020-09-06 | Discharge: 2020-09-06 | Disposition: A | Discharge status: Home / Self Care | Payer: Medicare Other | Attending: Urology | Admitting: Urology

## 2020-09-06 ENCOUNTER — Ambulatory Visit (HOSPITAL_COMMUNITY): Payer: Medicare Other | Admitting: Certified Registered Nurse Anesthetist

## 2020-09-06 ENCOUNTER — Encounter (HOSPITAL_COMMUNITY): Payer: Self-pay

## 2020-09-06 ENCOUNTER — Encounter (HOSPITAL_COMMUNITY)
Admission: RE | Disposition: A | Discharge status: Home / Self Care | Payer: Self-pay | Source: Home / Self Care | Attending: Urology

## 2020-09-06 ENCOUNTER — Ambulatory Visit (HOSPITAL_COMMUNITY): Payer: Medicare Other | Admitting: Physician Assistant

## 2020-09-06 ENCOUNTER — Encounter (HOSPITAL_COMMUNITY): Payer: Self-pay | Admitting: Urology

## 2020-09-06 ENCOUNTER — Ambulatory Visit (HOSPITAL_COMMUNITY): Payer: Medicare Other

## 2020-09-06 DIAGNOSIS — I10 Essential (primary) hypertension: Secondary | ICD-10-CM | POA: Insufficient documentation

## 2020-09-06 DIAGNOSIS — N320 Bladder-neck obstruction: Secondary | ICD-10-CM | POA: Diagnosis not present

## 2020-09-06 DIAGNOSIS — R350 Frequency of micturition: Secondary | ICD-10-CM | POA: Insufficient documentation

## 2020-09-06 DIAGNOSIS — R3912 Poor urinary stream: Secondary | ICD-10-CM | POA: Insufficient documentation

## 2020-09-06 DIAGNOSIS — J449 Chronic obstructive pulmonary disease, unspecified: Secondary | ICD-10-CM | POA: Diagnosis not present

## 2020-09-06 DIAGNOSIS — N401 Enlarged prostate with lower urinary tract symptoms: Principal | ICD-10-CM | POA: Insufficient documentation

## 2020-09-06 DIAGNOSIS — N138 Other obstructive and reflux uropathy: Secondary | ICD-10-CM | POA: Diagnosis not present

## 2020-09-06 DIAGNOSIS — Z8546 Personal history of malignant neoplasm of prostate: Secondary | ICD-10-CM

## 2020-09-06 DIAGNOSIS — Z79899 Other long term (current) drug therapy: Secondary | ICD-10-CM | POA: Insufficient documentation

## 2020-09-06 DIAGNOSIS — C61 Malignant neoplasm of prostate: Secondary | ICD-10-CM | POA: Diagnosis not present

## 2020-09-06 HISTORY — PX: SPACE OAR INSTILLATION: SHX6769

## 2020-09-06 HISTORY — PX: TRANSURETHRAL RESECTION OF PROSTATE: SHX73

## 2020-09-06 HISTORY — PX: GOLD SEED IMPLANT: SHX6343

## 2020-09-06 SURGERY — TURP (TRANSURETHRAL RESECTION OF PROSTATE)
Anesthesia: General | Site: Rectum

## 2020-09-06 MED ORDER — LIDOCAINE 2% (20 MG/ML) 5 ML SYRINGE
INTRAMUSCULAR | Status: AC
  Filled 2020-09-06: qty 5

## 2020-09-06 MED ORDER — PROPOFOL 10 MG/ML IV BOLUS
INTRAVENOUS | Status: DC | PRN
  Administered 2020-09-06: 150 mg via INTRAVENOUS

## 2020-09-06 MED ORDER — MIDAZOLAM HCL 2 MG/2ML IJ SOLN
INTRAMUSCULAR | Status: AC
  Filled 2020-09-06: qty 2

## 2020-09-06 MED ORDER — ASCORBIC ACID 500 MG PO TABS: 1000.0000 mg | ORAL_TABLET | Freq: Every day | ORAL | Status: DC

## 2020-09-06 MED ORDER — DIPHENHYDRAMINE HCL 25 MG PO CAPS: 25.0000 mg | ORAL_CAPSULE | Freq: Every evening | ORAL | Status: DC | PRN

## 2020-09-06 MED ORDER — FENTANYL CITRATE (PF) 100 MCG/2ML IJ SOLN
INTRAMUSCULAR | Status: AC
  Filled 2020-09-06: qty 2

## 2020-09-06 MED ORDER — CHLORHEXIDINE GLUCONATE 0.12 % MT SOLN
15.0000 mL | Freq: Once | OROMUCOSAL | Status: AC
  Administered 2020-09-06: 15 mL via OROMUCOSAL

## 2020-09-06 MED ORDER — FENTANYL CITRATE PF 50 MCG/ML IJ SOSY
PREFILLED_SYRINGE | INTRAMUSCULAR | Status: AC
  Administered 2020-09-06: 25 ug via INTRAVENOUS
  Filled 2020-09-06: qty 1

## 2020-09-06 MED ORDER — GUAIFENESIN 400 MG PO TABS: 400.0000 mg | ORAL_TABLET | ORAL | Status: DC | PRN

## 2020-09-06 MED ORDER — SPIRONOLACTONE 12.5 MG HALF TABLET: 12.5000 mg | ORAL_TABLET | Freq: Every day | ORAL | Status: DC

## 2020-09-06 MED ORDER — MIDAZOLAM HCL 2 MG/2ML IJ SOLN
INTRAMUSCULAR | Status: DC | PRN
  Administered 2020-09-06 (×2): 1 mg via INTRAVENOUS

## 2020-09-06 MED ORDER — TAMSULOSIN HCL 0.4 MG PO CAPS: 0.4000 mg | ORAL_CAPSULE | Freq: Two times a day (BID) | ORAL | Status: DC

## 2020-09-06 MED ORDER — EPHEDRINE 5 MG/ML INJ
INTRAVENOUS | Status: AC
  Filled 2020-09-06: qty 5

## 2020-09-06 MED ORDER — VITAMIN D3 50 MCG (2000 UT) PO CAPS: 4000.0000 [IU] | ORAL_CAPSULE | Freq: Every day | ORAL | Status: DC

## 2020-09-06 MED ORDER — SODIUM CHLORIDE (PF) 0.9 % IJ SOLN
INTRAMUSCULAR | Status: AC
  Filled 2020-09-06: qty 10

## 2020-09-06 MED ORDER — OXYMETAZOLINE HCL 0.05 % NA SOLN: 2.0000 | Freq: Two times a day (BID) | NASAL | Status: DC | PRN

## 2020-09-06 MED ORDER — LACTATED RINGERS IV SOLN: INTRAVENOUS | Status: DC

## 2020-09-06 MED ORDER — FLUTICASONE PROPIONATE 50 MCG/ACT NA SUSP: 2.0000 | Freq: Every day | NASAL | Status: DC

## 2020-09-06 MED ORDER — DEXAMETHASONE SODIUM PHOSPHATE 10 MG/ML IJ SOLN
INTRAMUSCULAR | Status: AC
  Filled 2020-09-06: qty 1

## 2020-09-06 MED ORDER — OXYCODONE HCL 5 MG PO TABS
5.0000 mg | ORAL_TABLET | Freq: Once | ORAL | Status: AC | PRN
  Administered 2020-09-06: 5 mg via ORAL

## 2020-09-06 MED ORDER — LIDOCAINE 2% (20 MG/ML) 5 ML SYRINGE
INTRAMUSCULAR | Status: DC | PRN
  Administered 2020-09-06: 100 mg via INTRAVENOUS

## 2020-09-06 MED ORDER — ONDANSETRON HCL 4 MG/2ML IJ SOLN
INTRAMUSCULAR | Status: AC
  Filled 2020-09-06: qty 2

## 2020-09-06 MED ORDER — CEFAZOLIN SODIUM-DEXTROSE 2-4 GM/100ML-% IV SOLN
2.0000 g | INTRAVENOUS | Status: AC
  Administered 2020-09-06: 2 g via INTRAVENOUS
  Filled 2020-09-06: qty 100

## 2020-09-06 MED ORDER — ONDANSETRON HCL 4 MG/2ML IJ SOLN: 4.0000 mg | Freq: Once | INTRAMUSCULAR | Status: DC | PRN

## 2020-09-06 MED ORDER — PROPOFOL 10 MG/ML IV BOLUS
INTRAVENOUS | Status: AC
  Filled 2020-09-06: qty 20

## 2020-09-06 MED ORDER — SODIUM CHLORIDE (PF) 0.9 % IJ SOLN
INTRAMUSCULAR | Status: DC | PRN
  Administered 2020-09-06: 15 mL via INTRAVENOUS

## 2020-09-06 MED ORDER — IPRATROPIUM-ALBUTEROL 20-100 MCG/ACT IN AERS: 1.0000 | INHALATION_SPRAY | Freq: Four times a day (QID) | RESPIRATORY_TRACT | Status: DC | PRN

## 2020-09-06 MED ORDER — PHENYLEPHRINE 40 MCG/ML (10ML) SYRINGE FOR IV PUSH (FOR BLOOD PRESSURE SUPPORT)
PREFILLED_SYRINGE | INTRAVENOUS | Status: AC
  Filled 2020-09-06: qty 10

## 2020-09-06 MED ORDER — OXYCODONE HCL 10 MG PO TABS: 10.0000 mg | ORAL_TABLET | ORAL | Status: DC

## 2020-09-06 MED ORDER — AMISULPRIDE (ANTIEMETIC) 5 MG/2ML IV SOLN: 10.0000 mg | Freq: Once | INTRAVENOUS | Status: DC | PRN

## 2020-09-06 MED ORDER — LATANOPROST 0.005 % OP SOLN: 1.0000 [drp] | Freq: Every day | OPHTHALMIC | Status: DC

## 2020-09-06 MED ORDER — SIMVASTATIN 20 MG PO TABS: 20.0000 mg | ORAL_TABLET | Freq: Every day | ORAL | Status: DC

## 2020-09-06 MED ORDER — 0.9 % SODIUM CHLORIDE (POUR BTL) OPTIME
TOPICAL | Status: DC | PRN
  Administered 2020-09-06: 1000 mL

## 2020-09-06 MED ORDER — OXYCODONE HCL 5 MG/5ML PO SOLN: 5.0000 mg | Freq: Once | ORAL | Status: AC | PRN

## 2020-09-06 MED ORDER — STERILE WATER FOR IRRIGATION IR SOLN
Status: DC | PRN
  Administered 2020-09-06: 1000 mL

## 2020-09-06 MED ORDER — BELLADONNA ALKALOIDS-OPIUM 16.2-60 MG RE SUPP
RECTAL | Status: DC | PRN
  Administered 2020-09-06: 1 via RECTAL

## 2020-09-06 MED ORDER — PANTOPRAZOLE SODIUM 40 MG PO TBEC: 40.0000 mg | DELAYED_RELEASE_TABLET | Freq: Every day | ORAL | Status: DC

## 2020-09-06 MED ORDER — METOPROLOL SUCCINATE ER 100 MG PO TB24: 100.0000 mg | ORAL_TABLET | Freq: Every day | ORAL | Status: DC

## 2020-09-06 MED ORDER — BELLADONNA ALKALOIDS-OPIUM 16.2-30 MG RE SUPP
RECTAL | Status: AC
  Filled 2020-09-06: qty 1

## 2020-09-06 MED ORDER — SODIUM CHLORIDE 0.9 % IR SOLN
Status: DC | PRN
  Administered 2020-09-06: 21000 mL via INTRAVESICAL

## 2020-09-06 MED ORDER — ONDANSETRON HCL 4 MG/2ML IJ SOLN
INTRAMUSCULAR | Status: DC | PRN
  Administered 2020-09-06: 4 mg via INTRAVENOUS

## 2020-09-06 MED ORDER — MONTELUKAST SODIUM 10 MG PO TABS: 10.0000 mg | ORAL_TABLET | Freq: Every day | ORAL | Status: DC

## 2020-09-06 MED ORDER — FUROSEMIDE 20 MG PO TABS: 20.0000 mg | ORAL_TABLET | Freq: Every day | ORAL | Status: DC

## 2020-09-06 MED ORDER — SALINE SPRAY 0.65 % NA SOLN: 1.0000 | NASAL | Status: DC | PRN

## 2020-09-06 MED ORDER — DEXAMETHASONE SODIUM PHOSPHATE 4 MG/ML IJ SOLN
INTRAMUSCULAR | Status: DC | PRN
  Administered 2020-09-06: 10 mg via INTRAVENOUS

## 2020-09-06 MED ORDER — ORAL CARE MOUTH RINSE: 15.0000 mL | Freq: Once | OROMUCOSAL | Status: AC

## 2020-09-06 MED ORDER — OXYCODONE HCL 5 MG PO TABS
ORAL_TABLET | ORAL | Status: AC
  Filled 2020-09-06: qty 1

## 2020-09-06 MED ORDER — FENTANYL CITRATE (PF) 100 MCG/2ML IJ SOLN
INTRAMUSCULAR | Status: DC | PRN
  Administered 2020-09-06 (×5): 50 ug via INTRAVENOUS

## 2020-09-06 MED ORDER — EPHEDRINE SULFATE-NACL 50-0.9 MG/10ML-% IV SOSY
PREFILLED_SYRINGE | INTRAVENOUS | Status: DC | PRN
  Administered 2020-09-06 (×2): 5 mg via INTRAVENOUS

## 2020-09-06 MED ORDER — SIMETHICONE 125 MG PO CAPS: 125.0000 mg | ORAL_CAPSULE | Freq: Four times a day (QID) | ORAL | Status: DC | PRN

## 2020-09-06 MED ORDER — FENTANYL CITRATE PF 50 MCG/ML IJ SOSY
25.0000 ug | PREFILLED_SYRINGE | INTRAMUSCULAR | Status: DC | PRN
  Administered 2020-09-06: 25 ug via INTRAVENOUS

## 2020-09-06 MED ORDER — SODIUM CHLORIDE 0.9% FLUSH: 3.0000 mL | Freq: Two times a day (BID) | INTRAVENOUS | Status: DC

## 2020-09-06 MED ORDER — GABAPENTIN 300 MG PO CAPS: 300.0000 mg | ORAL_CAPSULE | Freq: Three times a day (TID) | ORAL | Status: DC

## 2020-09-06 MED ORDER — FINASTERIDE 5 MG PO TABS: 5.0000 mg | ORAL_TABLET | Freq: Every day | ORAL | Status: DC

## 2020-09-06 MED ORDER — FLUTICASONE-UMECLIDIN-VILANT 200-62.5-25 MCG/INH IN AEPB: 1.0000 | INHALATION_SPRAY | Freq: Every day | RESPIRATORY_TRACT | Status: DC

## 2020-09-06 MED ORDER — RAMIPRIL 10 MG PO CAPS: 10.0000 mg | ORAL_CAPSULE | Freq: Every morning | ORAL | Status: DC

## 2020-09-06 MED ORDER — POLYVINYL ALCOHOL-POVIDONE PF 1.4-0.6 % OP SOLN: 2.0000 [drp] | Freq: Three times a day (TID) | OPHTHALMIC | Status: DC | PRN

## 2020-09-06 SURGICAL SUPPLY — 32 items
BAG DRN RND TRDRP ANRFLXCHMBR (UROLOGICAL SUPPLIES) ×2
BAG URINE DRAIN 2000ML AR STRL (UROLOGICAL SUPPLIES) ×1 IMPLANT
BAG URO CATCHER STRL LF (MISCELLANEOUS) ×3 IMPLANT
CATH FOLEY 3WAY 30CC 22FR (CATHETERS) ×1 IMPLANT
CATH HEMA 3WAY 30CC 22FR COUDE (CATHETERS) IMPLANT
DRAPE FOOT SWITCH (DRAPES) ×3 IMPLANT
DRSG TEGADERM 4X4.75 (GAUZE/BANDAGES/DRESSINGS) IMPLANT
GAUZE SPONGE 4X4 12PLY STRL (GAUZE/BANDAGES/DRESSINGS) IMPLANT
GLOVE SURG ENC MOIS LTX SZ6.5 (GLOVE) ×6 IMPLANT
GLOVE SURG ENC MOIS LTX SZ7.5 (GLOVE) ×1 IMPLANT
GLOVE SURG ORTHO LTX SZ8 (GLOVE) ×1 IMPLANT
GLOVE SURG UNDER POLY LF SZ6.5 (GLOVE) ×1 IMPLANT
GLOVE SURG UNDER POLY LF SZ7.5 (GLOVE) ×1 IMPLANT
GOWN STRL REUS W/TWL LRG LVL3 (GOWN DISPOSABLE) ×3 IMPLANT
GOWN STRL REUS W/TWL XL LVL3 (GOWN DISPOSABLE) ×1 IMPLANT
HOLDER FOLEY CATH W/STRAP (MISCELLANEOUS) ×3 IMPLANT
IMPL SPACEOAR VUE SYSTEM (Spacer) IMPLANT
IMPLANT SPACEOAR VUE SYSTEM (Spacer) ×3 IMPLANT
IV NS IRRIG 3000ML ARTHROMATIC (IV SOLUTION) ×7 IMPLANT
KIT TURNOVER KIT A (KITS) ×3 IMPLANT
LOOP CUT BIPOLAR 24F LRG (ELECTROSURGICAL) ×3 IMPLANT
MANIFOLD NEPTUNE II (INSTRUMENTS) ×3 IMPLANT
NS IRRIG 1000ML POUR BTL (IV SOLUTION) ×1 IMPLANT
PACK CYSTO (CUSTOM PROCEDURE TRAY) ×3 IMPLANT
PLUG CATH AND CAP STER (CATHETERS) ×2 IMPLANT
SYR 10ML LL (SYRINGE) ×1 IMPLANT
SYR 30ML LL (SYRINGE) ×1 IMPLANT
SYR TOOMEY IRRIG 70ML (MISCELLANEOUS) ×3
SYRINGE TOOMEY IRRIG 70ML (MISCELLANEOUS) ×2 IMPLANT
TUBING CONNECTING 10 (TUBING) ×3 IMPLANT
TUBING UROLOGY SET (TUBING) ×3 IMPLANT
WATER STERILE IRR 1000ML POUR (IV SOLUTION) ×1 IMPLANT

## 2020-09-06 NOTE — Transfer of Care (Signed)
Immediate Anesthesia Transfer of Care Note  Patient: Cody Tate  Procedure(s) Performed: TRANSURETHRAL RESECTION OF THE PROSTATE (TURP) (Prostate) GOLD SEED IMPLANT/ PLACEMENT OF FIDUCIAL MARKERS (Prostate) SPACE OAR INSTILLATION (Rectum)  Patient Location: PACU  Anesthesia Type:General  Level of Consciousness: awake and patient cooperative  Airway & Oxygen Therapy: Patient Spontanous Breathing and Patient connected to face mask  Post-op Assessment: Report given to RN and Post -op Vital signs reviewed and stable  Post vital signs: Reviewed and stable  Last Vitals:  Vitals Value Taken Time  BP 168/83 09/06/20 1219  Temp    Pulse 90 09/06/20 1222  Resp 16 09/06/20 1222  SpO2 100 % 09/06/20 1222  Vitals shown include unvalidated device data.  Last Pain:  Vitals:   09/06/20 0902  TempSrc: Oral  PainSc:          Complications: No notable events documented.

## 2020-09-06 NOTE — Discharge Summary (Signed)
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 Briefly, Cody Tate is a 70 y.o. year old patient with cT1c high risk prostate cancer and BPH with bladder outlet obstruction.   Hospital Course: Patient tolerated the procedure well.  He was then transferred to the floor after an uneventful PACU stay.  His hospital course was uncomplicated.  On POD#0 he had met discharge criteria: was eating a regular diet, was up and ambulating independently,  pain was well controlled, catheter was clear without CBI and was ready to for discharge.   Laboratory values:  No results for input(s): WBC, HGB, HCT in the last 72 hours. No results for input(s): NA, K, CL, CO2, GLUCOSE, BUN, CREATININE, CALCIUM in the last 72 hours. No results for input(s): LABPT, INR in the last 72 hours. No results for input(s): LABURIN in the last 72 hours. Results for orders placed or performed in visit on 09/02/20  SARS Coronavirus 2 (TAT 6-24 hrs)     Status: None   Collection Time: 09/02/20 12:00 AM  Result Value Ref Range Status   SARS Coronavirus 2 RESULT: NEGATIVE  Final    Comment: RESULT: NEGATIVESARS-CoV-2 INTERPRETATION:A NEGATIVE  test result means that  SARS-CoV-2 RNA was not present in the specimen above the limit of detection of this test. This does not preclude a possible SARS-CoV-2 infection and should not be used as the  sole basis for patient management decisions. Negative results must be combined with clinical observations, patient history, and epidemiological information. Optimum specimen types and timing for peak viral levels during infections caused by SARS-CoV-2  have not been determined. Collection of multiple specimens or types of specimens may be necessary to detect virus. Improper specimen collection and handling, sequence variability under primers/probes, or organism present below the limit of detection may  lead to false negative results. Positive and negative predictive values of testing are highly dependent on prevalence. False negative test results are more likely when prevalence of disease is high.The expected result is NEGATIVE.Fact S heet for  Healthcare Providers: LocalChronicle.no Sheet for Patients: SalonLookup.es Reference Range - Negative     Disposition: Home  Discharge instruction: The patient was instructed to be ambulatory but told to refrain from heavy lifting, strenuous activity, or driving.   Discharge medications:  Allergies as of 09/06/2020       Reactions   Adhesive [tape] Itching, Rash, Other (See Comments)   Occlusive tape and bandaids   Other Itching, Rash, Other (See Comments)   VICRYL Sutures        Medication List     TAKE these medications    Combivent Respimat 20-100 MCG/ACT Aers respimat Generic drug: Ipratropium-Albuterol Inhale 1 puff into the lungs every 6 (six) hours. What changed:  when to take this reasons to take this   diphenhydrAMINE 25 mg capsule Commonly  known as: BENADRYL Take 25 mg by mouth at bedtime as needed for sleep.   finasteride 5 MG tablet Commonly known as: PROSCAR Take 5 mg by mouth daily.    fluticasone 50 MCG/ACT nasal spray Commonly known as: FLONASE Place 2 sprays into both nostrils daily.   furosemide 20 MG tablet Commonly known as: LASIX Take 1 tablet (20 mg total) by mouth daily.   gabapentin 300 MG capsule Commonly known as: NEURONTIN Take 300 mg by mouth 3 (three) times daily.   guaifenesin 400 MG Tabs tablet Commonly known as: HUMIBID E Take 400 mg by mouth every 4 (four) hours as needed (for congestion or mucus).   ibuprofen 200 MG tablet Commonly known as: ADVIL Take 600 mg by mouth in the morning, at noon, in the evening, and at bedtime.   latanoprost 0.005 % ophthalmic solution Commonly known as: XALATAN Place 1 drop into both eyes at bedtime.   magnesium hydroxide 400 MG/5ML suspension Commonly known as: MILK OF MAGNESIA Take 15 mLs by mouth at bedtime.   metoprolol succinate 100 MG 24 hr tablet Commonly known as: TOPROL-XL Take 100 mg by mouth at bedtime. Take with or immediately following a meal.   montelukast 10 MG tablet Commonly known as: SINGULAIR Take 1 tablet (10 mg total) by mouth at bedtime.   multivitamin with minerals Tabs tablet Take 1 tablet by mouth daily.   omeprazole 40 MG capsule Commonly known as: PRILOSEC Take 1 capsule (40 mg total) by mouth daily. What changed: when to take this   Oxycodone HCl 10 MG Tabs Take 10 mg by mouth every 4 (four) hours.   oxymetazoline 0.05 % nasal spray Commonly known as: AFRIN Place 2 sprays into both nostrils 2 (two) times daily as needed for congestion.   Polyvinyl Alcohol-Povidone PF 1.4-0.6 % Soln Place 2 drops into both eyes 3 (three) times daily as needed (for dry eyes).   PreserVision AREDS 2+Multi Vit Caps Take 1 capsule by mouth in the morning and at bedtime.   Pulmicort Flexhaler 180 MCG/ACT inhaler Generic drug: budesonide Inhale 2 puffs into the lungs in the morning and at bedtime. What changed:  when to take this reasons to take this   ramipril 10 MG  capsule Commonly known as: ALTACE Take 10 mg by mouth every morning.   Simethicone 125 MG Caps Take 125 mg by mouth 4 (four) times daily as needed (for gas).   simvastatin 20 MG tablet Commonly known as: ZOCOR Take 20 mg by mouth at bedtime.   sodium chloride 0.65 % Soln nasal spray Commonly known as: OCEAN Place 1 spray into both nostrils as needed for congestion. As needed   spironolactone 25 MG tablet Commonly known as: ALDACTONE Take 12.5 mg by mouth at bedtime.   tamsulosin 0.4 MG Caps capsule Commonly known as: FLOMAX Take 0.4 mg by mouth 2 (two) times daily.   Trelegy Ellipta 200-62.5-25 MCG/INH Aepb Generic drug: Fluticasone-Umeclidin-Vilant Inhale 1 puff into the lungs daily.   vitamin C 1000 MG tablet Take 1,000 mg by mouth daily.   Vitamin D3 50 MCG (2000 UT) capsule Take 4,000 Units by mouth daily.        Followup:   Follow-up Information     [facility] Follow up.   Contact information: [place] [phone]

## 2020-09-06 NOTE — Anesthesia Postprocedure Evaluation (Signed)
Anesthesia Post Note  Patient: Cody Tate  Procedure(s) Performed: TRANSURETHRAL RESECTION OF THE PROSTATE (TURP) (Prostate) GOLD SEED IMPLANT/ PLACEMENT OF FIDUCIAL MARKERS (Prostate) SPACE OAR INSTILLATION (Rectum)     Patient location during evaluation: PACU Anesthesia Type: General Level of consciousness: awake and alert Pain management: pain level controlled Vital Signs Assessment: post-procedure vital signs reviewed and stable Respiratory status: spontaneous breathing, nonlabored ventilation and respiratory function stable Cardiovascular status: blood pressure returned to baseline and stable Postop Assessment: no apparent nausea or vomiting Anesthetic complications: no   No notable events documented.  Last Vitals:  Vitals:   09/06/20 1307 09/06/20 1315  BP:  (!) 162/89  Pulse: 77 79  Resp: 16 15  Temp:    SpO2: 97% 94%    Last Pain:  Vitals:   09/06/20 1315  TempSrc:   PainSc: 2                  Lidia Collum

## 2020-09-06 NOTE — Op Note (Addendum)
Preoperative diagnosis: Bladder outlet obstruction secondary to BPH Prostate cancer  Postoperative diagnosis:  Bladder outlet obstruction secondary to BPH Prostate cancer   Procedure:  Cystoscopy Transurethral resection of the prostate Fiducial marker placement into the prostate  Insertion of space OAR hydrogel  Surgeon: Jacalyn Lefevre, M.D.  Assistant: Ledon Snare, MD; Jimmy Footman, MD  Anesthesia: General  Complications: None  EBL: Minimal  Specimens: Prostate chips   Findings: Normal anterior urethra Prostatic urethra with lateral lobe hypertrophy and high riding bladder neck Bilateral orthotopic ureteral orifices seen at start and end of case Normal bladder mucosa without masses or stones; moderate trabeculations  Indication: Cody Tate is a patient with bladder outlet obstruction secondary to benign prostatic hyperplasia and high risk prostate cancer. After reviewing the management options for treatment, he elected to proceed with the above surgical procedure(s). We have discussed the potential benefits and risks of the procedure, side effects of the proposed treatment, the likelihood of the patient achieving the goals of the procedure, and any potential problems that might occur during the procedure or recuperation. Informed consent has been obtained.  Description of procedure:  The patient was taken to the operating room and general anesthesia was induced.  The patient was placed in the dorsal lithotomy position, prepped and draped in the usual sterile fashion, and preoperative antibiotics were administered. A preoperative time-out was performed. The patient was administered preoperative antibiotics.   Next, transrectal ultrasonography was utilized to visualize the prostate. Three gold fiducial markers were then placed into the prostate via transperineal needles under ultrasound guidance at the right apex, right base, and left mid gland under direct ultrasound  guidance. A site in the midline was then selected on the perineum for placement of an 18 g needle with saline. The needle was advanced above the rectum and below Denonvillier's fascia to the mid gland and confirmed to be in the midline on transverse imaging. One cc of saline was injected confirming appropriate expansion of this space. A total of 5 cc of saline was then injected to open the space further bilaterally. The saline syringe was then removed and the SpaceOAR hydrogel was injected with good distribution bilaterally.  The patient was then reprepped and draped in usual sterile fashion. Cystourethroscopy was performed.  The patient's urethra was examined and was demonstrated bilobar prostatic hypertrophy.  The bladder was then systematically examined in its entirety. There was no evidence of any bladder tumors, stones, or other mucosal pathology.  The ureteral orifices were identified and marked so as to be avoided during the procedure.  The prostate adenoma was then resected utilizing loop cautery resection with the bipolar cutting loop.  The prostate adenoma from the bladder neck back to the verumontanum was resected beginning at the six o'clock position and then extended to include the right and left lobes of the prostate and anterior prostate. Care was taken not to resect distal to the verumontanum.  Hemostasis was then achieved with the cautery and the bladder was emptied and reinspected with no significant bleeding noted at the end of the procedure.    A 22 French three-way way catheter was then placed into the bladder.  The patient appeared to tolerate the procedure well and without complications.  Continuous bladder irrigation was initiated.  The patient was able to be awakened and transferred to the recovery unit in satisfactory condition.    Plan: The patient will be observed overnight.  CBI can be weaned as tolerated.  He will be discharged home  on postop day 1 with Foley catheter in  place.

## 2020-09-06 NOTE — Interval H&P Note (Signed)
History and Physical Interval Note:  09/06/2020 10:01 AM  Cody Tate  has presented today for surgery, with the diagnosis of BENIGN PROSTATIC HYPERPLASIA W/ BLADDER OUTLET OBSTRUCTION, PROSTATE CANCER.  The various methods of treatment have been discussed with the patient and family. After consideration of risks, benefits and other options for treatment, the patient has consented to  Procedure(s) with comments: [place] (TURP) (N/A) - 2 HRS GOLD SEED IMPLANT (N/A) SPACE OAR INSTILLATION (N/A) as a surgical intervention.  The patient's history has been reviewed, patient examined, no change in status, stable for surgery.  I have reviewed the patient's chart and labs.  Questions were answered to the patient's satisfaction.     Cassara Nida D Rorey Bisson

## 2020-09-06 NOTE — Progress Notes (Signed)
  Radiation Oncology         [phone] ________________________________  Name: Cody Tate MRN: [id number]  Date: 08/09/2020  DOB: September 25, 1950  Chart Note:  I assisted the Urologist using the radiation oncology transrectal ultrasound stepper in the OR.  The procedure went well with good placement and positioning of three fiducial markers and SpaceOAR gel.  Following this, Dr. [name] performed TURP to alleviate obstructive symptoms, and noted visualization of one of the gold markers.  We elected to leave it in situ, in hopes that it would remain after healing.   Plan:  At this point, the patient is set up to proceed with IMRT simulation  after TURP recovery on 11/08/20.  ________________________________  [name]. Tammi Klippel, M.D.

## 2020-09-06 NOTE — Discharge Instructions (Signed)

## 2020-09-06 NOTE — Anesthesia Procedure Notes (Signed)
Procedure Name: LMA Insertion Date/Time: 09/06/2020 10:40 AM Performed by: [name], CRNA Pre-anesthesia Checklist: Emergency Drugs available, Patient identified, Suction available and Patient being monitored Patient Re-evaluated:Patient Re-evaluated prior to induction Oxygen Delivery Method: Circle system utilized Preoxygenation: Pre-oxygenation with 100% oxygen Induction Type: IV induction Ventilation: Mask ventilation without difficulty LMA: LMA with gastric port inserted LMA Size: 4.0 Number of attempts: 1 Placement Confirmation: positive ETCO2 and breath sounds checked- equal and bilateral Tube secured with: Tape Dental Injury: Teeth and Oropharynx as per pre-operative assessment

## 2020-09-06 NOTE — H&P (Signed)
CC/HPI: cc: prostate nodule   04/28/20: 70 year old man with a history of BPH with lower urinary tract symptoms currently managed with tamsulosin b.i.d. and recently started on Proscar referred for prostate nodule. Patient denies any family history of prostate cancer, hematuria or blood thinners. His most recent PSA was 1.85 on 03/21/2020. He has only been taking Proscar for 1 month. He has a weak urinary stream and nocturia 2-3 times a night. It is more of an inconvenience for him than anything else. His PVR in the office is 65 cc.   cT2a  High risk  Gleason 4+5=9 on biopsy 05/2020  Pre-biopsy PSA 1.85 (3.7 corrected)   08/04/20: 70 year old man with a history of high risk prostate cancer who is decided to proceed with radiation for treatment on combination with ADT. He would however like to undergo channel TURP prior to radiation.     ALLERGIES: Adhesive Band-Aid Tape Vicryl Sutures    MEDICATIONS: Aspirin  Metoprolol Succinate 100 mg tablet, extended release 24 hr  Simvastatin  Tamsulosin Hcl 0.4 mg capsule  Combivent Respimat 20 mcg-100 mcg/actuation mist inhaler  Fluticasone Propionate  Gabapentin 300 mg capsule  Ibuprofen  Lasix 20 mg tablet  Latanoprost 0.005 % drops  Montelukast Sodium 10 mg tablet  Mucus Relief  Oxycodone Hcl 10 mg tablet  Preservision Areds  Prilosec Otc  Proscar 5 mg tablet  Pulmicort Flexhaler 180 mcg/actuation (160 mcg delivered) aerosol powder, breath activated  Ramipril 10 mg capsule  Spironolactone 25 mg tablet  Sudafed  Trelegy Ellipta 200 mcg-62.5 mcg-25 mcg/actuation blister, with inhalation device     GU PSH: Locm 300-'399Mg'$ /Ml Iodine,1Ml - 06/13/2020 Prostate Needle Biopsy - 05/20/2020     NON-GU PSH: Back surgery Cataract surgery, Bilateral Knee Arthroscopy, Left Lumbar Spine Fusion Neck Surgery Sinus Surgery.. Surgical Pathology, Gross And Microscopic Examination For Prostate Needle - 05/20/2020 Wrist Arthroscopy, Left     GU  PMH: BPH w/LUTS - 06/30/2020, - 05/27/2020, Patient to continue tamsulosin b.i.d. and Proscar daily. We did discuss possibly proceeding with cysto and flowrate however will depend on prostatebiopsy results. , - 04/28/2020 Prostate Cancer - 06/30/2020, - 06/13/2020, - 05/27/2020 Prostate nodule w/ LUTS - 06/30/2020, - 05/20/2020, Patient's DRE with area of concern left lobe that feels significantly firmer/harder than right side. Discussed with patient proceeding with a TRUS prostate biopsy. The risks and benefits of a prostate biopsy were discussed with the patient including but not limited to infection, bleeding, need for future treatment and injury to surrounding structures. Patient would like to proceed with biopsy. He was given instructions. , - 04/28/2020 Weak Urinary Stream - 06/30/2020, - 05/27/2020, - 04/28/2020 Nocturia - 05/27/2020, - 04/28/2020 Urinary Frequency - 04/28/2020    NON-GU PMH: Acute gastric ulcer with hemorrhage Arrhythmia Arthritis Cardiac murmur, unspecified COPD GERD Glaucoma Hypercholesterolemia Hypertension    FAMILY HISTORY: None   SOCIAL HISTORY: Marital Status: Married Preferred Language: English; Race: White Current Smoking Status: Patient does not smoke anymore.   Tobacco Use Assessment Completed: Used Tobacco in last 30 days? Drinks 2 caffeinated drinks per day.    REVIEW OF SYSTEMS:    GU Review Male:   Patient denies frequent urination, hard to postpone urination, burning/ pain with urination, get up at night to urinate, leakage of urine, stream starts and stops, trouble starting your stream, have to strain to urinate , erection problems, and penile pain.  Gastrointestinal (Upper):   Patient denies nausea, vomiting, and indigestion/ heartburn.  Gastrointestinal (Lower):   Patient denies diarrhea and  constipation.  Constitutional:   Patient denies fever, night sweats, weight loss, and fatigue.  Skin:   Patient denies skin rash/ lesion and itching.  Eyes:    Patient denies blurred vision and double vision.  Ears/ Nose/ Throat:   Patient denies sore throat and sinus problems.  Hematologic/Lymphatic:   Patient denies swollen glands and easy bruising.  Cardiovascular:   Patient denies leg swelling and chest pains.  Respiratory:   Patient denies cough and shortness of breath.  Endocrine:   Patient denies excessive thirst.  Musculoskeletal:   Patient denies back pain and joint pain.  Neurological:   Patient denies dizziness and headaches.  Psychologic:   Patient denies depression and anxiety.   VITAL SIGNS: None   MULTI-SYSTEM PHYSICAL EXAMINATION:    Constitutional: Well-nourished. No physical deformities. Normally developed. Good grooming.  Neck: Neck symmetrical, not swollen. Normal tracheal position.  Respiratory: No labored breathing, no use of accessory muscles.   Skin: No paleness, no jaundice, no cyanosis. No lesion, no ulcer, no rash.  Neurologic / Psychiatric: Oriented to time, oriented to place, oriented to person. No depression, no anxiety, no agitation.  Gastrointestinal: No rigidity  Eyes: Normal conjunctivae. Normal eyelids.  Ears, Nose, Mouth, and Throat: Left ear no scars, no lesions, no masses. Right ear no scars, no lesions, no masses. Nose no scars, no lesions, no masses. Normal hearing. Normal lips.  Musculoskeletal: Normal gait and station of head and neck.     Complexity of Data:  Records Review:   Previous Patient Records, POC Tool  Urine Test Review:   Urinalysis  Notes:                     06/13/2020: BUN 23, creatinine 0.9   PROCEDURES:          Eligard '45mg'$ / 6 Month PO:[id number], [phone] The arm/ abdomen/ hip/ thigh was sterilely prepped with alcohol. Eligard was injected subcutaneously ([place]) using standard technique. The patient tolerated the procedure well. A band aid was applied (not sure if this is necessary for a  injection). The site was dry when the patient left the exam room. The patient will return as  scheduled.  After treatment was administered, patient was observed without any adverse reactions noted.    Qty: 45 Adm. By: Jacalyn Lefevre, M.D.  Unit: mg Lot No   Route: SQ Exp. Date   Freq: None Mfgr.:   Site: None   ASSESSMENT:      ICD-10 Details  1 GU:   BPH w/LUTS - N40.1 Chronic, Stable  2   Prostate Cancer - C61 Chronic, Stable  3   Urinary Frequency - R35.0 Chronic, Stable  4   Weak Urinary Stream - R39.12 Chronic, Stable   PLAN:           Document Letter(s):  Created for Patient: Clinical Summary         Notes:   1. Prostate cancer: Patient received 6 month eligard injection today. Counseled regarding Vit D and C intake. Will place fiducial markers and spaceOAR at time of TURP.   2. BPH with LUTs: Prostate overall volume not significant however he does have very weak urinary stream associated symptoms. Discussed proceeding with a TURP prior to starting radiation so that symptoms do not worsen. Risks and benefits of the procedure were discussed with the patient in detail. He understands he will have a catheter following the procedure for several days. Radiation will not start until proximally 8 weeks following  the TURP.

## 2020-09-06 NOTE — Anesthesia Preprocedure Evaluation (Addendum)
Anesthesia Evaluation  Patient identified by MRN, date of birth, ID band Patient awake    Reviewed: Allergy & Precautions, NPO status , Patient's Chart, lab work & pertinent test results, reviewed documented beta blocker date and time   History of Anesthesia Complications Negative for: history of anesthetic complications  Airway Mallampati: II  TM Distance: >3 FB Neck ROM: Full    Dental  (+) Dental Advisory Given, Loose, Missing,    Pulmonary COPD, former smoker,    Pulmonary exam normal        Cardiovascular hypertension, Pt. on medications and Pt. on home beta blockers Normal cardiovascular exam   Echo 06/27/20: EF >75%, no RWMA, mild LVH, g1dd, normal RV fn, mild MR, normal AV  Normal stress test 2020   Neuro/Psych Meningioma S/p spinal cord stimulator    GI/Hepatic Neg liver ROS, hiatal hernia, GERD  ,  Endo/Other  negative endocrine ROS  Renal/GU negative Renal ROS   Prostate cancer    Musculoskeletal  (+) Arthritis , narcotic dependent  Abdominal   Peds  Hematology negative hematology ROS (+)   Anesthesia Other Findings   Reproductive/Obstetrics                            Anesthesia Physical Anesthesia Plan  ASA: 3  Anesthesia Plan: General   Post-op Pain Management:    Induction: Intravenous  PONV Risk Score and Plan: 2 and Ondansetron, Dexamethasone, Midazolam and Treatment may vary due to age or medical condition  Airway Management Planned: LMA  Additional Equipment: None  Intra-op Plan:   Post-operative Plan: Extubation in OR  Informed Consent: I have reviewed the patients History and Physical, chart, labs and discussed the procedure including the risks, benefits and alternatives for the proposed anesthesia with the patient or authorized representative who has indicated his/her understanding and acceptance.     Dental advisory given  Plan Discussed with:    Anesthesia Plan Comments:         Anesthesia Quick Evaluation

## 2020-09-07 LAB — SURGICAL PATHOLOGY

## 2020-09-08 ENCOUNTER — Encounter (HOSPITAL_COMMUNITY): Payer: Self-pay | Admitting: Urology

## 2020-09-16 ENCOUNTER — Other Ambulatory Visit: Payer: Self-pay | Admitting: Critical Care Medicine

## 2020-09-21 DIAGNOSIS — Z23 Encounter for immunization: Secondary | ICD-10-CM | POA: Diagnosis not present

## 2020-09-21 DIAGNOSIS — Z79899 Other long term (current) drug therapy: Secondary | ICD-10-CM | POA: Diagnosis not present

## 2020-09-21 DIAGNOSIS — I872 Venous insufficiency (chronic) (peripheral): Secondary | ICD-10-CM | POA: Diagnosis not present

## 2020-09-21 DIAGNOSIS — R338 Other retention of urine: Secondary | ICD-10-CM | POA: Diagnosis not present

## 2020-09-21 DIAGNOSIS — R600 Localized edema: Secondary | ICD-10-CM | POA: Diagnosis not present

## 2020-09-21 DIAGNOSIS — I10 Essential (primary) hypertension: Secondary | ICD-10-CM | POA: Diagnosis not present

## 2020-09-28 DIAGNOSIS — H353232 Exudative age-related macular degeneration, bilateral, with inactive choroidal neovascularization: Secondary | ICD-10-CM | POA: Diagnosis not present

## 2020-09-28 DIAGNOSIS — H52203 Unspecified astigmatism, bilateral: Secondary | ICD-10-CM | POA: Diagnosis not present

## 2020-09-28 DIAGNOSIS — H401131 Primary open-angle glaucoma, bilateral, mild stage: Secondary | ICD-10-CM | POA: Diagnosis not present

## 2020-09-29 DIAGNOSIS — Z23 Encounter for immunization: Secondary | ICD-10-CM | POA: Diagnosis not present

## 2020-10-03 DIAGNOSIS — H353231 Exudative age-related macular degeneration, bilateral, with active choroidal neovascularization: Secondary | ICD-10-CM | POA: Diagnosis not present

## 2020-10-05 DIAGNOSIS — R338 Other retention of urine: Secondary | ICD-10-CM | POA: Diagnosis not present

## 2020-11-07 DIAGNOSIS — H401132 Primary open-angle glaucoma, bilateral, moderate stage: Secondary | ICD-10-CM | POA: Diagnosis not present

## 2020-11-07 DIAGNOSIS — H353231 Exudative age-related macular degeneration, bilateral, with active choroidal neovascularization: Secondary | ICD-10-CM | POA: Diagnosis not present

## 2020-11-07 DIAGNOSIS — H43813 Vitreous degeneration, bilateral: Secondary | ICD-10-CM | POA: Diagnosis not present

## 2020-11-08 ENCOUNTER — Ambulatory Visit: Admission: RE | Admit: 2020-11-08 | Payer: Medicare Other | Source: Ambulatory Visit | Admitting: Radiation Oncology

## 2020-11-08 NOTE — Progress Notes (Signed)
  Radiation Oncology         [phone]) [phone] ________________________________  Name: Cody Tate MRN: [id number]  Date: 11/08/2020  DOB: [birth year]  SIMULATION AND TREATMENT PLANNING NOTE    ICD-10-CM   1. Malignant neoplasm of prostate ([place])  C61       DIAGNOSIS:  70 y.o. 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  NARRATIVE:  The patient was brought to the [place].  Identity was confirmed.  All relevant records and images related to the planned course of therapy were reviewed.  The patient freely provided informed written consent to proceed with treatment after reviewing the details related to the planned course of therapy. The consent form was witnessed and verified by the simulation staff.  Then, the patient was set-up in a stable reproducible supine position for radiation therapy.  A vacuum lock pillow device was custom fabricated to position his legs in a reproducible immobilized position.  Then, I performed a urethrogram under sterile conditions to identify the prostatic bed.  CT images were obtained.  Surface markings were placed.  The CT images were loaded into the planning software.  Then the prostate bed target, pelvic lymph node target and avoidance structures including the rectum, bladder, bowel and hips were contoured.  Treatment planning then occurred.  The radiation prescription was entered and confirmed.  A total of one complex treatment devices were fabricated. I have requested : Intensity Modulated Radiotherapy (IMRT) is medically necessary for this case for the following reason:  Rectal sparing.Marland Kitchen  PLAN:  The patient will receive 45 Gy in 25 fractions of 1.8 Gy, followed by a boost to the prostate to a total dose of 75 Gy with 15 additional fractions of 2 Gy.   ________________________________  [name] Tammi Klippel, M.D.

## 2020-11-10 ENCOUNTER — Telehealth: Payer: Self-pay | Admitting: *Deleted

## 2020-11-10 NOTE — Telephone Encounter (Signed)
CALLED PATIENT TO REMIND OF SIM APPT. FOR 11-11-20- ARRIVAL TIME- 1:15 PM @ [place], SPOKE WITH PATIENT AND HE IS AWARE OF THIS APPT.

## 2020-11-11 ENCOUNTER — Ambulatory Visit
Admission: RE | Admit: 2020-11-11 | Discharge: 2020-11-11 | Disposition: A | Discharge status: Home / Self Care | Payer: Medicare Other | Source: Ambulatory Visit | Attending: Radiation Oncology | Admitting: Radiation Oncology

## 2020-11-11 DIAGNOSIS — C61 Malignant neoplasm of prostate: Secondary | ICD-10-CM | POA: Diagnosis not present

## 2020-11-17 ENCOUNTER — Other Ambulatory Visit: Payer: Self-pay | Admitting: Critical Care Medicine

## 2020-11-21 DIAGNOSIS — H401132 Primary open-angle glaucoma, bilateral, moderate stage: Secondary | ICD-10-CM | POA: Diagnosis not present

## 2020-11-21 DIAGNOSIS — H353231 Exudative age-related macular degeneration, bilateral, with active choroidal neovascularization: Secondary | ICD-10-CM | POA: Diagnosis not present

## 2020-11-21 DIAGNOSIS — C61 Malignant neoplasm of prostate: Secondary | ICD-10-CM | POA: Diagnosis not present

## 2020-11-21 DIAGNOSIS — H04123 Dry eye syndrome of bilateral lacrimal glands: Secondary | ICD-10-CM | POA: Diagnosis not present

## 2020-11-21 DIAGNOSIS — H43813 Vitreous degeneration, bilateral: Secondary | ICD-10-CM | POA: Diagnosis not present

## 2020-11-22 ENCOUNTER — Ambulatory Visit: Payer: Medicare Other

## 2020-11-23 ENCOUNTER — Ambulatory Visit: Payer: Medicare Other

## 2020-11-28 ENCOUNTER — Other Ambulatory Visit: Payer: Self-pay

## 2020-11-28 ENCOUNTER — Ambulatory Visit: Payer: Medicare Other

## 2020-11-28 ENCOUNTER — Ambulatory Visit
Admission: RE | Admit: 2020-11-28 | Discharge: 2020-11-28 | Disposition: A | Discharge status: Home / Self Care | Payer: Medicare Other | Source: Ambulatory Visit | Attending: Radiation Oncology | Admitting: Radiation Oncology

## 2020-11-28 DIAGNOSIS — Z510 Encounter for antineoplastic radiation therapy: Secondary | ICD-10-CM | POA: Diagnosis not present

## 2020-11-28 DIAGNOSIS — C61 Malignant neoplasm of prostate: Secondary | ICD-10-CM | POA: Insufficient documentation

## 2020-11-29 ENCOUNTER — Ambulatory Visit
Admission: RE | Admit: 2020-11-29 | Discharge: 2020-11-29 | Disposition: A | Discharge status: Home / Self Care | Payer: Medicare Other | Source: Ambulatory Visit | Attending: Radiation Oncology | Admitting: Radiation Oncology

## 2020-11-29 DIAGNOSIS — Z510 Encounter for antineoplastic radiation therapy: Secondary | ICD-10-CM | POA: Diagnosis not present

## 2020-11-29 DIAGNOSIS — C61 Malignant neoplasm of prostate: Secondary | ICD-10-CM | POA: Diagnosis not present

## 2020-11-30 ENCOUNTER — Ambulatory Visit
Admission: RE | Admit: 2020-11-30 | Discharge: 2020-11-30 | Disposition: A | Discharge status: Home / Self Care | Payer: Medicare Other | Source: Ambulatory Visit | Attending: Radiation Oncology | Admitting: Radiation Oncology

## 2020-11-30 ENCOUNTER — Other Ambulatory Visit: Payer: Self-pay

## 2020-11-30 DIAGNOSIS — C61 Malignant neoplasm of prostate: Secondary | ICD-10-CM | POA: Diagnosis not present

## 2020-11-30 DIAGNOSIS — Z510 Encounter for antineoplastic radiation therapy: Secondary | ICD-10-CM | POA: Diagnosis not present

## 2020-12-01 ENCOUNTER — Ambulatory Visit
Admission: RE | Admit: 2020-12-01 | Discharge: 2020-12-01 | Disposition: A | Discharge status: Home / Self Care | Payer: Medicare Other | Source: Ambulatory Visit | Attending: Radiation Oncology | Admitting: Radiation Oncology

## 2020-12-01 ENCOUNTER — Other Ambulatory Visit: Payer: Self-pay

## 2020-12-01 DIAGNOSIS — Z510 Encounter for antineoplastic radiation therapy: Secondary | ICD-10-CM | POA: Diagnosis not present

## 2020-12-01 DIAGNOSIS — C61 Malignant neoplasm of prostate: Secondary | ICD-10-CM | POA: Insufficient documentation

## 2020-12-02 ENCOUNTER — Ambulatory Visit
Admission: RE | Admit: 2020-12-02 | Discharge: 2020-12-02 | Disposition: A | Discharge status: Home / Self Care | Payer: Medicare Other | Source: Ambulatory Visit | Attending: Radiation Oncology | Admitting: Radiation Oncology

## 2020-12-02 DIAGNOSIS — C61 Malignant neoplasm of prostate: Secondary | ICD-10-CM | POA: Diagnosis not present

## 2020-12-02 DIAGNOSIS — Z510 Encounter for antineoplastic radiation therapy: Secondary | ICD-10-CM | POA: Diagnosis not present

## 2020-12-02 NOTE — Progress Notes (Signed)
Pt here for patient teaching. Pt given Radiation and You booklet and skin care instructions.  Reviewed areas of pertinence such as diarrhea, fatigue, hair loss, nausea and vomiting, sexual and fertility changes, skin changes, and urinary and bladder changes. Pt able to give teach back of to pat skin, use unscented/gentle soap, use baby wipes, have Imodium on hand, drink plenty of water, and sitz bath, avoid applying anything to skin within 4 hours of treatment and to use an electric razor if they must shave. Pt verbalizes understanding of information given and will contact nursing with any questions or concerns.     [email or web address]      

## 2020-12-05 ENCOUNTER — Ambulatory Visit
Admission: RE | Admit: 2020-12-05 | Discharge: 2020-12-05 | Disposition: A | Discharge status: Home / Self Care | Payer: Medicare Other | Source: Ambulatory Visit | Attending: Radiation Oncology | Admitting: Radiation Oncology

## 2020-12-05 ENCOUNTER — Other Ambulatory Visit: Payer: Self-pay

## 2020-12-05 DIAGNOSIS — C61 Malignant neoplasm of prostate: Secondary | ICD-10-CM | POA: Diagnosis not present

## 2020-12-05 DIAGNOSIS — Z510 Encounter for antineoplastic radiation therapy: Secondary | ICD-10-CM | POA: Diagnosis not present

## 2020-12-06 ENCOUNTER — Ambulatory Visit
Admission: RE | Admit: 2020-12-06 | Discharge: 2020-12-06 | Disposition: A | Discharge status: Home / Self Care | Payer: Medicare Other | Source: Ambulatory Visit | Attending: Radiation Oncology | Admitting: Radiation Oncology

## 2020-12-06 DIAGNOSIS — Z510 Encounter for antineoplastic radiation therapy: Secondary | ICD-10-CM | POA: Diagnosis not present

## 2020-12-06 DIAGNOSIS — C61 Malignant neoplasm of prostate: Secondary | ICD-10-CM | POA: Diagnosis not present

## 2020-12-07 ENCOUNTER — Other Ambulatory Visit: Payer: Self-pay

## 2020-12-07 ENCOUNTER — Ambulatory Visit
Admission: RE | Admit: 2020-12-07 | Discharge: 2020-12-07 | Disposition: A | Discharge status: Home / Self Care | Payer: Medicare Other | Source: Ambulatory Visit | Attending: Radiation Oncology | Admitting: Radiation Oncology

## 2020-12-07 DIAGNOSIS — C61 Malignant neoplasm of prostate: Secondary | ICD-10-CM | POA: Diagnosis not present

## 2020-12-07 DIAGNOSIS — Z510 Encounter for antineoplastic radiation therapy: Secondary | ICD-10-CM | POA: Diagnosis not present

## 2020-12-08 ENCOUNTER — Ambulatory Visit
Admission: RE | Admit: 2020-12-08 | Discharge: 2020-12-08 | Disposition: A | Discharge status: Home / Self Care | Payer: Medicare Other | Source: Ambulatory Visit | Attending: Radiation Oncology | Admitting: Radiation Oncology

## 2020-12-08 DIAGNOSIS — Z510 Encounter for antineoplastic radiation therapy: Secondary | ICD-10-CM | POA: Diagnosis not present

## 2020-12-08 DIAGNOSIS — C61 Malignant neoplasm of prostate: Secondary | ICD-10-CM | POA: Diagnosis not present

## 2020-12-09 ENCOUNTER — Ambulatory Visit
Admission: RE | Admit: 2020-12-09 | Discharge: 2020-12-09 | Disposition: A | Discharge status: Home / Self Care | Payer: Medicare Other | Source: Ambulatory Visit | Attending: Radiation Oncology | Admitting: Radiation Oncology

## 2020-12-09 ENCOUNTER — Other Ambulatory Visit: Payer: Self-pay

## 2020-12-09 DIAGNOSIS — Z510 Encounter for antineoplastic radiation therapy: Secondary | ICD-10-CM | POA: Diagnosis not present

## 2020-12-09 DIAGNOSIS — C61 Malignant neoplasm of prostate: Secondary | ICD-10-CM | POA: Diagnosis not present

## 2020-12-12 ENCOUNTER — Ambulatory Visit
Admission: RE | Admit: 2020-12-12 | Discharge: 2020-12-12 | Disposition: A | Discharge status: Home / Self Care | Payer: Medicare Other | Source: Ambulatory Visit | Attending: Radiation Oncology | Admitting: Radiation Oncology

## 2020-12-12 ENCOUNTER — Other Ambulatory Visit: Payer: Self-pay

## 2020-12-12 DIAGNOSIS — H353211 Exudative age-related macular degeneration, right eye, with active choroidal neovascularization: Secondary | ICD-10-CM | POA: Diagnosis not present

## 2020-12-12 DIAGNOSIS — H353221 Exudative age-related macular degeneration, left eye, with active choroidal neovascularization: Secondary | ICD-10-CM | POA: Diagnosis not present

## 2020-12-12 DIAGNOSIS — C61 Malignant neoplasm of prostate: Secondary | ICD-10-CM | POA: Diagnosis not present

## 2020-12-12 DIAGNOSIS — H43813 Vitreous degeneration, bilateral: Secondary | ICD-10-CM | POA: Diagnosis not present

## 2020-12-12 DIAGNOSIS — H353231 Exudative age-related macular degeneration, bilateral, with active choroidal neovascularization: Secondary | ICD-10-CM | POA: Diagnosis not present

## 2020-12-12 DIAGNOSIS — Z510 Encounter for antineoplastic radiation therapy: Secondary | ICD-10-CM | POA: Diagnosis not present

## 2020-12-12 DIAGNOSIS — H401132 Primary open-angle glaucoma, bilateral, moderate stage: Secondary | ICD-10-CM | POA: Diagnosis not present

## 2020-12-13 ENCOUNTER — Ambulatory Visit
Admission: RE | Admit: 2020-12-13 | Discharge: 2020-12-13 | Disposition: A | Discharge status: Home / Self Care | Payer: Medicare Other | Source: Ambulatory Visit | Attending: Radiation Oncology | Admitting: Radiation Oncology

## 2020-12-13 DIAGNOSIS — C61 Malignant neoplasm of prostate: Secondary | ICD-10-CM | POA: Diagnosis not present

## 2020-12-13 DIAGNOSIS — Z510 Encounter for antineoplastic radiation therapy: Secondary | ICD-10-CM | POA: Diagnosis not present

## 2020-12-14 ENCOUNTER — Ambulatory Visit
Admission: RE | Admit: 2020-12-14 | Discharge: 2020-12-14 | Disposition: A | Discharge status: Home / Self Care | Payer: Medicare Other | Source: Ambulatory Visit | Attending: Radiation Oncology | Admitting: Radiation Oncology

## 2020-12-14 DIAGNOSIS — C61 Malignant neoplasm of prostate: Secondary | ICD-10-CM | POA: Diagnosis not present

## 2020-12-14 DIAGNOSIS — Z510 Encounter for antineoplastic radiation therapy: Secondary | ICD-10-CM | POA: Diagnosis not present

## 2020-12-15 ENCOUNTER — Ambulatory Visit
Admission: RE | Admit: 2020-12-15 | Discharge: 2020-12-15 | Disposition: A | Discharge status: Home / Self Care | Payer: Medicare Other | Source: Ambulatory Visit | Attending: Radiation Oncology | Admitting: Radiation Oncology

## 2020-12-15 ENCOUNTER — Other Ambulatory Visit: Payer: Self-pay

## 2020-12-15 DIAGNOSIS — C61 Malignant neoplasm of prostate: Secondary | ICD-10-CM | POA: Diagnosis not present

## 2020-12-15 DIAGNOSIS — Z510 Encounter for antineoplastic radiation therapy: Secondary | ICD-10-CM | POA: Diagnosis not present

## 2020-12-16 ENCOUNTER — Ambulatory Visit
Admission: RE | Admit: 2020-12-16 | Discharge: 2020-12-16 | Disposition: A | Discharge status: Home / Self Care | Payer: Medicare Other | Source: Ambulatory Visit | Attending: Radiation Oncology | Admitting: Radiation Oncology

## 2020-12-16 DIAGNOSIS — C61 Malignant neoplasm of prostate: Secondary | ICD-10-CM | POA: Diagnosis not present

## 2020-12-16 DIAGNOSIS — Z510 Encounter for antineoplastic radiation therapy: Secondary | ICD-10-CM | POA: Diagnosis not present

## 2020-12-19 ENCOUNTER — Ambulatory Visit
Admission: RE | Admit: 2020-12-19 | Discharge: 2020-12-19 | Disposition: A | Discharge status: Home / Self Care | Payer: Medicare Other | Source: Ambulatory Visit | Attending: Radiation Oncology | Admitting: Radiation Oncology

## 2020-12-19 ENCOUNTER — Ambulatory Visit: Payer: Medicare Other

## 2020-12-19 ENCOUNTER — Other Ambulatory Visit: Payer: Self-pay

## 2020-12-19 DIAGNOSIS — C61 Malignant neoplasm of prostate: Secondary | ICD-10-CM | POA: Diagnosis not present

## 2020-12-19 DIAGNOSIS — Z510 Encounter for antineoplastic radiation therapy: Secondary | ICD-10-CM | POA: Diagnosis not present

## 2020-12-20 ENCOUNTER — Ambulatory Visit: Payer: Medicare Other

## 2020-12-20 ENCOUNTER — Ambulatory Visit
Admission: RE | Admit: 2020-12-20 | Discharge: 2020-12-20 | Disposition: A | Discharge status: Home / Self Care | Payer: Medicare Other | Source: Ambulatory Visit | Attending: Radiation Oncology | Admitting: Radiation Oncology

## 2020-12-20 DIAGNOSIS — C61 Malignant neoplasm of prostate: Secondary | ICD-10-CM | POA: Diagnosis not present

## 2020-12-20 DIAGNOSIS — Z510 Encounter for antineoplastic radiation therapy: Secondary | ICD-10-CM | POA: Diagnosis not present

## 2020-12-21 ENCOUNTER — Other Ambulatory Visit: Payer: Self-pay

## 2020-12-21 ENCOUNTER — Ambulatory Visit
Admission: RE | Admit: 2020-12-21 | Discharge: 2020-12-21 | Disposition: A | Discharge status: Home / Self Care | Payer: Medicare Other | Source: Ambulatory Visit | Attending: Radiation Oncology | Admitting: Radiation Oncology

## 2020-12-21 DIAGNOSIS — Z510 Encounter for antineoplastic radiation therapy: Secondary | ICD-10-CM | POA: Diagnosis not present

## 2020-12-21 DIAGNOSIS — C61 Malignant neoplasm of prostate: Secondary | ICD-10-CM | POA: Diagnosis not present

## 2020-12-22 ENCOUNTER — Ambulatory Visit
Admission: RE | Admit: 2020-12-22 | Discharge: 2020-12-22 | Disposition: A | Discharge status: Home / Self Care | Payer: Medicare Other | Source: Ambulatory Visit | Attending: Radiation Oncology | Admitting: Radiation Oncology

## 2020-12-22 DIAGNOSIS — C61 Malignant neoplasm of prostate: Secondary | ICD-10-CM | POA: Diagnosis not present

## 2020-12-22 DIAGNOSIS — Z510 Encounter for antineoplastic radiation therapy: Secondary | ICD-10-CM | POA: Diagnosis not present

## 2020-12-23 ENCOUNTER — Other Ambulatory Visit: Payer: Self-pay

## 2020-12-23 ENCOUNTER — Ambulatory Visit
Admission: RE | Admit: 2020-12-23 | Discharge: 2020-12-23 | Disposition: A | Discharge status: Home / Self Care | Payer: Medicare Other | Source: Ambulatory Visit | Attending: Radiation Oncology | Admitting: Radiation Oncology

## 2020-12-23 DIAGNOSIS — Z510 Encounter for antineoplastic radiation therapy: Secondary | ICD-10-CM | POA: Diagnosis not present

## 2020-12-23 DIAGNOSIS — C61 Malignant neoplasm of prostate: Secondary | ICD-10-CM | POA: Diagnosis not present

## 2020-12-27 ENCOUNTER — Ambulatory Visit
Admission: RE | Admit: 2020-12-27 | Discharge: 2020-12-27 | Disposition: A | Discharge status: Home / Self Care | Payer: Medicare Other | Source: Ambulatory Visit | Attending: Radiation Oncology | Admitting: Radiation Oncology

## 2020-12-27 ENCOUNTER — Other Ambulatory Visit: Payer: Self-pay

## 2020-12-27 DIAGNOSIS — Z510 Encounter for antineoplastic radiation therapy: Secondary | ICD-10-CM | POA: Diagnosis not present

## 2020-12-27 DIAGNOSIS — C61 Malignant neoplasm of prostate: Secondary | ICD-10-CM | POA: Diagnosis not present

## 2020-12-28 ENCOUNTER — Ambulatory Visit
Admission: RE | Admit: 2020-12-28 | Discharge: 2020-12-28 | Disposition: A | Discharge status: Home / Self Care | Payer: Medicare Other | Source: Ambulatory Visit | Attending: Radiation Oncology | Admitting: Radiation Oncology

## 2020-12-28 DIAGNOSIS — C61 Malignant neoplasm of prostate: Secondary | ICD-10-CM | POA: Diagnosis not present

## 2020-12-28 DIAGNOSIS — Z510 Encounter for antineoplastic radiation therapy: Secondary | ICD-10-CM | POA: Diagnosis not present

## 2020-12-29 ENCOUNTER — Ambulatory Visit
Admission: RE | Admit: 2020-12-29 | Discharge: 2020-12-29 | Disposition: A | Discharge status: Home / Self Care | Payer: Medicare Other | Source: Ambulatory Visit | Attending: Radiation Oncology | Admitting: Radiation Oncology

## 2020-12-29 ENCOUNTER — Other Ambulatory Visit: Payer: Self-pay

## 2020-12-29 DIAGNOSIS — C61 Malignant neoplasm of prostate: Secondary | ICD-10-CM | POA: Diagnosis not present

## 2020-12-29 DIAGNOSIS — Z510 Encounter for antineoplastic radiation therapy: Secondary | ICD-10-CM | POA: Diagnosis not present

## 2020-12-30 ENCOUNTER — Ambulatory Visit
Admission: RE | Admit: 2020-12-30 | Discharge: 2020-12-30 | Disposition: A | Discharge status: Home / Self Care | Payer: Medicare Other | Source: Ambulatory Visit | Attending: Radiation Oncology | Admitting: Radiation Oncology

## 2020-12-30 DIAGNOSIS — Z510 Encounter for antineoplastic radiation therapy: Secondary | ICD-10-CM | POA: Diagnosis not present

## 2020-12-30 DIAGNOSIS — C61 Malignant neoplasm of prostate: Secondary | ICD-10-CM | POA: Diagnosis not present

## 2021-01-01 DIAGNOSIS — Z20828 Contact with and (suspected) exposure to other viral communicable diseases: Secondary | ICD-10-CM | POA: Diagnosis not present

## 2021-01-03 ENCOUNTER — Ambulatory Visit
Admission: RE | Admit: 2021-01-03 | Discharge: 2021-01-03 | Disposition: A | Discharge status: Home / Self Care | Payer: Medicare Other | Source: Ambulatory Visit | Attending: Radiation Oncology | Admitting: Radiation Oncology

## 2021-01-03 ENCOUNTER — Other Ambulatory Visit: Payer: Self-pay

## 2021-01-03 DIAGNOSIS — C61 Malignant neoplasm of prostate: Secondary | ICD-10-CM | POA: Diagnosis not present

## 2021-01-03 DIAGNOSIS — Z510 Encounter for antineoplastic radiation therapy: Secondary | ICD-10-CM | POA: Insufficient documentation

## 2021-01-04 ENCOUNTER — Ambulatory Visit
Admission: RE | Admit: 2021-01-04 | Discharge: 2021-01-04 | Disposition: A | Discharge status: Home / Self Care | Payer: Medicare Other | Source: Ambulatory Visit | Attending: Radiation Oncology | Admitting: Radiation Oncology

## 2021-01-04 DIAGNOSIS — Z510 Encounter for antineoplastic radiation therapy: Secondary | ICD-10-CM | POA: Diagnosis not present

## 2021-01-04 DIAGNOSIS — C61 Malignant neoplasm of prostate: Secondary | ICD-10-CM | POA: Diagnosis not present

## 2021-01-05 ENCOUNTER — Other Ambulatory Visit: Payer: Self-pay

## 2021-01-05 ENCOUNTER — Ambulatory Visit
Admission: RE | Admit: 2021-01-05 | Discharge: 2021-01-05 | Disposition: A | Discharge status: Home / Self Care | Payer: Medicare Other | Source: Ambulatory Visit | Attending: Radiation Oncology | Admitting: Radiation Oncology

## 2021-01-05 DIAGNOSIS — C61 Malignant neoplasm of prostate: Secondary | ICD-10-CM | POA: Diagnosis not present

## 2021-01-05 DIAGNOSIS — Z510 Encounter for antineoplastic radiation therapy: Secondary | ICD-10-CM | POA: Diagnosis not present

## 2021-01-06 ENCOUNTER — Ambulatory Visit
Admission: RE | Admit: 2021-01-06 | Discharge: 2021-01-06 | Disposition: A | Discharge status: Home / Self Care | Payer: Medicare Other | Source: Ambulatory Visit | Attending: Radiation Oncology | Admitting: Radiation Oncology

## 2021-01-06 DIAGNOSIS — C61 Malignant neoplasm of prostate: Secondary | ICD-10-CM | POA: Diagnosis not present

## 2021-01-06 DIAGNOSIS — Z510 Encounter for antineoplastic radiation therapy: Secondary | ICD-10-CM | POA: Diagnosis not present

## 2021-01-07 DIAGNOSIS — Z20822 Contact with and (suspected) exposure to covid-19: Secondary | ICD-10-CM | POA: Diagnosis not present

## 2021-01-08 ENCOUNTER — Other Ambulatory Visit: Payer: Self-pay | Admitting: Cardiovascular Disease

## 2021-01-09 ENCOUNTER — Ambulatory Visit
Admission: RE | Admit: 2021-01-09 | Discharge: 2021-01-09 | Disposition: A | Discharge status: Home / Self Care | Payer: Medicare Other | Source: Ambulatory Visit | Attending: Radiation Oncology | Admitting: Radiation Oncology

## 2021-01-09 ENCOUNTER — Other Ambulatory Visit: Payer: Self-pay

## 2021-01-09 DIAGNOSIS — C61 Malignant neoplasm of prostate: Secondary | ICD-10-CM | POA: Diagnosis not present

## 2021-01-09 DIAGNOSIS — H43813 Vitreous degeneration, bilateral: Secondary | ICD-10-CM | POA: Diagnosis not present

## 2021-01-09 DIAGNOSIS — H401132 Primary open-angle glaucoma, bilateral, moderate stage: Secondary | ICD-10-CM | POA: Diagnosis not present

## 2021-01-09 DIAGNOSIS — Z510 Encounter for antineoplastic radiation therapy: Secondary | ICD-10-CM | POA: Diagnosis not present

## 2021-01-09 DIAGNOSIS — H353231 Exudative age-related macular degeneration, bilateral, with active choroidal neovascularization: Secondary | ICD-10-CM | POA: Diagnosis not present

## 2021-01-10 ENCOUNTER — Ambulatory Visit
Admission: RE | Admit: 2021-01-10 | Discharge: 2021-01-10 | Disposition: A | Discharge status: Home / Self Care | Payer: Medicare Other | Source: Ambulatory Visit | Attending: Radiation Oncology | Admitting: Radiation Oncology

## 2021-01-10 DIAGNOSIS — E7800 Pure hypercholesterolemia, unspecified: Secondary | ICD-10-CM | POA: Diagnosis not present

## 2021-01-10 DIAGNOSIS — Z6835 Body mass index (BMI) 35.0-35.9, adult: Secondary | ICD-10-CM | POA: Diagnosis not present

## 2021-01-10 DIAGNOSIS — C61 Malignant neoplasm of prostate: Secondary | ICD-10-CM | POA: Diagnosis not present

## 2021-01-10 DIAGNOSIS — I700 Atherosclerosis of aorta: Secondary | ICD-10-CM | POA: Diagnosis not present

## 2021-01-10 DIAGNOSIS — J449 Chronic obstructive pulmonary disease, unspecified: Secondary | ICD-10-CM | POA: Diagnosis not present

## 2021-01-10 DIAGNOSIS — Z510 Encounter for antineoplastic radiation therapy: Secondary | ICD-10-CM | POA: Diagnosis not present

## 2021-01-10 DIAGNOSIS — I10 Essential (primary) hypertension: Secondary | ICD-10-CM | POA: Diagnosis not present

## 2021-01-11 ENCOUNTER — Ambulatory Visit
Admission: RE | Admit: 2021-01-11 | Discharge: 2021-01-11 | Disposition: A | Discharge status: Home / Self Care | Payer: Medicare Other | Source: Ambulatory Visit | Attending: Radiation Oncology | Admitting: Radiation Oncology

## 2021-01-11 ENCOUNTER — Other Ambulatory Visit: Payer: Self-pay

## 2021-01-11 DIAGNOSIS — Z510 Encounter for antineoplastic radiation therapy: Secondary | ICD-10-CM | POA: Diagnosis not present

## 2021-01-11 DIAGNOSIS — C61 Malignant neoplasm of prostate: Secondary | ICD-10-CM | POA: Diagnosis not present

## 2021-01-12 ENCOUNTER — Ambulatory Visit
Admission: RE | Admit: 2021-01-12 | Discharge: 2021-01-12 | Disposition: A | Discharge status: Home / Self Care | Payer: Medicare Other | Source: Ambulatory Visit | Attending: Radiation Oncology | Admitting: Radiation Oncology

## 2021-01-12 DIAGNOSIS — Z510 Encounter for antineoplastic radiation therapy: Secondary | ICD-10-CM | POA: Diagnosis not present

## 2021-01-12 DIAGNOSIS — C61 Malignant neoplasm of prostate: Secondary | ICD-10-CM | POA: Diagnosis not present

## 2021-01-13 ENCOUNTER — Ambulatory Visit
Admission: RE | Admit: 2021-01-13 | Discharge: 2021-01-13 | Disposition: A | Discharge status: Home / Self Care | Payer: Medicare Other | Source: Ambulatory Visit | Attending: Radiation Oncology | Admitting: Radiation Oncology

## 2021-01-13 ENCOUNTER — Other Ambulatory Visit: Payer: Self-pay

## 2021-01-13 DIAGNOSIS — C61 Malignant neoplasm of prostate: Secondary | ICD-10-CM | POA: Diagnosis not present

## 2021-01-13 DIAGNOSIS — Z510 Encounter for antineoplastic radiation therapy: Secondary | ICD-10-CM | POA: Diagnosis not present

## 2021-01-16 ENCOUNTER — Ambulatory Visit: Payer: Medicare Other

## 2021-01-16 ENCOUNTER — Ambulatory Visit
Admission: RE | Admit: 2021-01-16 | Discharge: 2021-01-16 | Disposition: A | Discharge status: Home / Self Care | Payer: Medicare Other | Source: Ambulatory Visit | Attending: Radiation Oncology | Admitting: Radiation Oncology

## 2021-01-16 ENCOUNTER — Other Ambulatory Visit: Payer: Self-pay

## 2021-01-16 DIAGNOSIS — Z510 Encounter for antineoplastic radiation therapy: Secondary | ICD-10-CM | POA: Diagnosis not present

## 2021-01-16 DIAGNOSIS — C61 Malignant neoplasm of prostate: Secondary | ICD-10-CM | POA: Diagnosis not present

## 2021-01-17 ENCOUNTER — Ambulatory Visit: Payer: Medicare Other

## 2021-01-17 ENCOUNTER — Ambulatory Visit
Admission: RE | Admit: 2021-01-17 | Discharge: 2021-01-17 | Disposition: A | Discharge status: Home / Self Care | Payer: Medicare Other | Source: Ambulatory Visit | Attending: Radiation Oncology | Admitting: Radiation Oncology

## 2021-01-17 DIAGNOSIS — Z510 Encounter for antineoplastic radiation therapy: Secondary | ICD-10-CM | POA: Diagnosis not present

## 2021-01-17 DIAGNOSIS — C61 Malignant neoplasm of prostate: Secondary | ICD-10-CM | POA: Diagnosis not present

## 2021-01-18 ENCOUNTER — Ambulatory Visit
Admission: RE | Admit: 2021-01-18 | Discharge: 2021-01-18 | Disposition: A | Discharge status: Home / Self Care | Payer: Medicare Other | Source: Ambulatory Visit | Attending: Radiation Oncology | Admitting: Radiation Oncology

## 2021-01-18 ENCOUNTER — Other Ambulatory Visit: Payer: Self-pay

## 2021-01-18 DIAGNOSIS — C61 Malignant neoplasm of prostate: Secondary | ICD-10-CM | POA: Diagnosis not present

## 2021-01-18 DIAGNOSIS — Z510 Encounter for antineoplastic radiation therapy: Secondary | ICD-10-CM | POA: Diagnosis not present

## 2021-01-19 ENCOUNTER — Ambulatory Visit
Admission: RE | Admit: 2021-01-19 | Discharge: 2021-01-19 | Disposition: A | Discharge status: Home / Self Care | Payer: Medicare Other | Source: Ambulatory Visit | Attending: Radiation Oncology | Admitting: Radiation Oncology

## 2021-01-19 DIAGNOSIS — Z510 Encounter for antineoplastic radiation therapy: Secondary | ICD-10-CM | POA: Diagnosis not present

## 2021-01-19 DIAGNOSIS — C61 Malignant neoplasm of prostate: Secondary | ICD-10-CM | POA: Diagnosis not present

## 2021-01-20 ENCOUNTER — Other Ambulatory Visit: Payer: Self-pay

## 2021-01-20 ENCOUNTER — Ambulatory Visit
Admission: RE | Admit: 2021-01-20 | Discharge: 2021-01-20 | Disposition: A | Discharge status: Home / Self Care | Payer: Medicare Other | Source: Ambulatory Visit | Attending: Radiation Oncology | Admitting: Radiation Oncology

## 2021-01-20 DIAGNOSIS — Z510 Encounter for antineoplastic radiation therapy: Secondary | ICD-10-CM | POA: Diagnosis not present

## 2021-01-20 DIAGNOSIS — C61 Malignant neoplasm of prostate: Secondary | ICD-10-CM | POA: Diagnosis not present

## 2021-01-23 ENCOUNTER — Other Ambulatory Visit: Payer: Self-pay

## 2021-01-23 ENCOUNTER — Ambulatory Visit
Admission: RE | Admit: 2021-01-23 | Discharge: 2021-01-23 | Disposition: A | Discharge status: Home / Self Care | Payer: Medicare Other | Source: Ambulatory Visit | Attending: Radiation Oncology | Admitting: Radiation Oncology

## 2021-01-23 DIAGNOSIS — Z510 Encounter for antineoplastic radiation therapy: Secondary | ICD-10-CM | POA: Diagnosis not present

## 2021-01-23 DIAGNOSIS — C61 Malignant neoplasm of prostate: Secondary | ICD-10-CM | POA: Diagnosis not present

## 2021-01-24 ENCOUNTER — Ambulatory Visit
Admission: RE | Admit: 2021-01-24 | Discharge: 2021-01-24 | Disposition: A | Discharge status: Home / Self Care | Payer: Medicare Other | Source: Ambulatory Visit | Attending: Radiation Oncology | Admitting: Radiation Oncology

## 2021-01-24 ENCOUNTER — Encounter: Payer: Self-pay | Admitting: Urology

## 2021-01-24 DIAGNOSIS — C61 Malignant neoplasm of prostate: Secondary | ICD-10-CM | POA: Diagnosis not present

## 2021-01-24 DIAGNOSIS — Z510 Encounter for antineoplastic radiation therapy: Secondary | ICD-10-CM | POA: Diagnosis not present

## 2021-01-31 DIAGNOSIS — H401331 Pigmentary glaucoma, bilateral, mild stage: Secondary | ICD-10-CM | POA: Diagnosis not present

## 2021-01-31 DIAGNOSIS — H353231 Exudative age-related macular degeneration, bilateral, with active choroidal neovascularization: Secondary | ICD-10-CM | POA: Diagnosis not present

## 2021-01-31 DIAGNOSIS — H43813 Vitreous degeneration, bilateral: Secondary | ICD-10-CM | POA: Diagnosis not present

## 2021-01-31 DIAGNOSIS — H16223 Keratoconjunctivitis sicca, not specified as Sjogren's, bilateral: Secondary | ICD-10-CM | POA: Diagnosis not present

## 2021-02-02 DIAGNOSIS — C61 Malignant neoplasm of prostate: Secondary | ICD-10-CM | POA: Diagnosis not present

## 2021-02-08 DIAGNOSIS — Z5111 Encounter for antineoplastic chemotherapy: Secondary | ICD-10-CM | POA: Diagnosis not present

## 2021-02-08 DIAGNOSIS — R300 Dysuria: Secondary | ICD-10-CM | POA: Diagnosis not present

## 2021-02-08 DIAGNOSIS — N401 Enlarged prostate with lower urinary tract symptoms: Secondary | ICD-10-CM | POA: Diagnosis not present

## 2021-02-08 DIAGNOSIS — C61 Malignant neoplasm of prostate: Secondary | ICD-10-CM | POA: Diagnosis not present

## 2021-02-08 DIAGNOSIS — R350 Frequency of micturition: Secondary | ICD-10-CM | POA: Diagnosis not present

## 2021-02-15 ENCOUNTER — Other Ambulatory Visit: Payer: Self-pay | Admitting: Pulmonary Disease

## 2021-02-20 DIAGNOSIS — H353231 Exudative age-related macular degeneration, bilateral, with active choroidal neovascularization: Secondary | ICD-10-CM | POA: Diagnosis not present

## 2021-02-22 ENCOUNTER — Encounter: Payer: Self-pay | Admitting: Urology

## 2021-02-22 ENCOUNTER — Other Ambulatory Visit: Payer: Self-pay | Admitting: Pulmonary Disease

## 2021-02-22 NOTE — Progress Notes (Signed)
Spoke w/ patient, verified identity, and began nursing interview. Patient reports prostate discomfort 5/10, dysuria, and nocturia. No other issues reported at this time.  Meaningful use complete. I-PSS score of 12 (moderate). Currently taking Flomax 0.4mg  as directed. Urology follow-up- May 2023 -per patient.  Patient reminded of his 10:00am-02/23/21 telephone appointment w/ Ashlyn Bruning PA-C. I left my extension [phone] in case patient needs to call. Patient verbalized understanding of information.  Patient contact [phone]

## 2021-02-23 ENCOUNTER — Ambulatory Visit
Admission: RE | Admit: 2021-02-23 | Discharge: 2021-02-23 | Disposition: A | Discharge status: Home / Self Care | Payer: Medicare Other | Source: Ambulatory Visit | Attending: Urology | Admitting: Urology

## 2021-02-23 DIAGNOSIS — C61 Malignant neoplasm of prostate: Secondary | ICD-10-CM

## 2021-02-23 NOTE — Progress Notes (Signed)
°  Radiation Oncology         [phone] ________________________________  Name: [name] MRN: [id number]  Date: 01/24/2021  DOB: 1950-06-17  End of Treatment Note  Diagnosis:   71 y.o. 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     Indication for treatment:  Curative, Definitive Radiotherapy concurrent with LT-ADT      Radiation treatment dates:   11/28/20 - 01/24/21  Site/dose:  1. The prostate, seminal vesicles, and pelvic lymph nodes were initially treated to 45 Gy in 25 fractions of 1.8 Gy  2. The prostate only was boosted to 75 Gy with 15 additional fractions of 2.0 Gy   Beams/energy:  1. The prostate, seminal vesicles, and pelvic lymph nodes were initially treated using VMAT intensity modulated radiotherapy delivering 6 megavolt photons. Image guidance was performed with CB-CT studies prior to each fraction. He was immobilized with a body fix lower extremity mold.  2. the prostate only was boosted using VMAT intensity modulated radiotherapy delivering 6 megavolt photons. Image guidance was performed with CB-CT studies prior to each fraction. He was immobilized with a body fix lower extremity mold.  Narrative: The patient tolerated radiation treatment relatively well with only minor urinary irritation and modest fatigue.  He did report increased nocturia x5 per night as well as dysuria at the start of his stream which was managed with AZO as needed.  He also experienced occasional abdominal discomfort and diarrhea but did not require any medical management.  Plan: The patient has completed radiation treatment. He will return to radiation oncology clinic for routine followup in one month. I advised him to call or return sooner if he has any questions or concerns related to his recovery or treatment. ________________________________  [name]. Tammi Klippel, M.D.

## 2021-02-23 NOTE — Progress Notes (Signed)
Radiation Oncology         [phone] ________________________________  Name: Cody Tate MRN: [id number]  Date: 02/23/2021  DOB: 08/27/1950  Post Treatment Note  CC: [name], MD  Robley Fries, MD  Diagnosis:   71 y.o. 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     Interval Since Last Radiation:  4 weeks; concurrent with LT-ADT (50-month Eligard injection 08/04/2020) 11/28/20 - 01/24/21:  1. The prostate, seminal vesicles, and pelvic lymph nodes were initially treated to 45 Gy in 25 fractions of 1.8 Gy  2. The prostate only was boosted to 75 Gy with 15 additional fractions of 2.0 Gy   Narrative:  I spoke with the patient to conduct his routine scheduled 1 month follow up visit via telephone to spare the patient unnecessary potential exposure in the healthcare setting during the current COVID-19 pandemic.  The patient was notified in advance and gave permission to proceed with this visit format.  He tolerated radiation treatment relatively well with only minor urinary irritation and modest fatigue.  He did report increased nocturia x5 per night as well as dysuria at the start of his stream which was managed with AZO as needed.  He also experienced occasional abdominal discomfort and diarrhea but did not require any medical management.                                On review of systems, the patient states that he is doing well in general.  He continues with residual nocturia and dysuria but reports that this is gradually improving.  He has continued taking finasteride and Flomax daily as prescribed.  He specifically denies gross hematuria, straining to void, incomplete bladder emptying or incontinence.  He reports a healthy appetite and is maintaining his weight.  He denies abdominal pain, nausea, vomiting, diarrhea or constipation.  He has continued with some mild fatigue and hot flashes associated with  his ADT but overall, is quite pleased with his progress to date.  ALLERGIES:  is allergic to adhesive [tape] and other.  Meds: Current Outpatient Medications  Medication Sig Dispense Refill   montelukast (SINGULAIR) 10 MG tablet TAKE 1 TABLET BY MOUTH AT  BEDTIME 90 tablet 3   Ascorbic Acid (VITAMIN C) 1000 MG tablet Take 1,000 mg by mouth daily.     budesonide (PULMICORT FLEXHALER) 180 MCG/ACT inhaler Inhale 2 puffs into the lungs in the morning and at bedtime. (Patient taking differently: Inhale 2 puffs into the lungs at bedtime as needed (shortness of breath or wheezing).) 3 each 3   Cholecalciferol (VITAMIN D3) 50 MCG (2000 UT) capsule Take 4,000 Units by mouth daily.     diphenhydrAMINE (BENADRYL) 25 mg capsule Take 25 mg by mouth at bedtime as needed for sleep.     finasteride (PROSCAR) 5 MG tablet Take 5 mg by mouth daily.     fluticasone (FLONASE) 50 MCG/ACT nasal spray Place 2 sprays into both nostrils daily.      furosemide (LASIX) 20 MG tablet Take 1 tablet by mouth once daily 90 tablet 1   gabapentin (NEURONTIN) 300 MG capsule Take 300 mg by mouth 3 (three) times daily.      guaifenesin (HUMIBID E) 400 MG TABS Take 400 mg by mouth every 4 (four) hours as needed (for congestion or mucus).      ibuprofen (  ADVIL,MOTRIN) 200 MG tablet Take 600 mg by mouth in the morning, at noon, in the evening, and at bedtime.     Ipratropium-Albuterol (COMBIVENT RESPIMAT) 20-100 MCG/ACT AERS respimat Inhale 1 puff into the lungs every 6 (six) hours as needed for wheezing or shortness of breath. 12 g 3   latanoprost (XALATAN) 0.005 % ophthalmic solution Place 1 drop into both eyes at bedtime.      magnesium hydroxide (MILK OF MAGNESIA) 400 MG/5ML suspension Take 15 mLs by mouth at bedtime.     metoprolol succinate (TOPROL-XL) 100 MG 24 hr tablet Take 100 mg by mouth at bedtime. Take with or immediately following a meal.     Multiple Vitamin (MULTIVITAMIN WITH MINERALS) TABS tablet Take 1 tablet by  mouth daily.     Multiple Vitamins-Minerals (PRESERVISION AREDS 2+MULTI VIT) CAPS Take 1 capsule by mouth in the morning and at bedtime.     omeprazole (PRILOSEC) 40 MG capsule Take 1 capsule (40 mg total) by mouth daily before breakfast. 90 capsule 3   Oxycodone HCl 10 MG TABS Take 10 mg by mouth every 4 (four) hours.     oxymetazoline (AFRIN) 0.05 % nasal spray Place 2 sprays into both nostrils 2 (two) times daily as needed for congestion.     Polyvinyl Alcohol-Povidone PF 1.4-0.6 % SOLN Place 2 drops into both eyes 3 (three) times daily as needed (for dry eyes).     ramipril (ALTACE) 10 MG capsule Take 10 mg by mouth every morning.      Simethicone 125 MG CAPS Take 125 mg by mouth 4 (four) times daily as needed (for gas).      simvastatin (ZOCOR) 20 MG tablet Take 20 mg by mouth at bedtime.      sodium chloride (OCEAN) 0.65 % SOLN nasal spray Place 1 spray into both nostrils as needed for congestion. As needed     spironolactone (ALDACTONE) 25 MG tablet Take 12.5 mg by mouth at bedtime.      Tamsulosin HCl (FLOMAX) 0.4 MG CAPS Take 0.4 mg by mouth 2 (two) times daily.     TRELEGY ELLIPTA 200-62.5-25 MCG/ACT AEPB USE 1 INHALATION BY MOUTH  DAILY 180 each 3   No current facility-administered medications for this encounter.    Physical Findings:  vitals were not taken for this visit.  Pain Assessment Pain Score: 5  (Prostate discomfort)/10 Unable to assess due to telephone follow-up visit format.  Lab Findings: Lab Results  Component Value Date   WBC 7.9 08/23/2020   HGB 15.3 08/23/2020   HCT 46.7 08/23/2020   MCV 90.3 08/23/2020   PLT 309 08/23/2020     Radiographic Findings: No results found.  Impression/Plan: 1. 71 y.o. 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    He will continue to follow up with urology for ongoing PSA determinations and has an appointment scheduled for labs on 04/25/2021 and will  see Dr. [name] on 05/02/2021.  He got a 15-month Eligard injection on 02/09/2021 and understands what to expect with regards to PSA monitoring going forward. I will look forward to following his response to treatment via correspondence with urology, and would be happy to continue to participate in his care if clinically indicated. I talked to the patient about what to expect in the future, including his risk for erectile dysfunction and rectal bleeding. I encouraged him to call or return to the office if he has  any questions regarding his previous radiation or possible radiation side effects. He was comfortable with this plan and will follow up as needed.     Nicholos Johns, PA-C

## 2021-02-27 ENCOUNTER — Other Ambulatory Visit: Payer: Self-pay | Admitting: Radiation Therapy

## 2021-02-27 ENCOUNTER — Other Ambulatory Visit: Payer: Self-pay | Admitting: *Deleted

## 2021-02-27 ENCOUNTER — Inpatient Hospital Stay: Payer: Medicare Other | Admitting: Internal Medicine

## 2021-02-27 DIAGNOSIS — D329 Benign neoplasm of meninges, unspecified: Secondary | ICD-10-CM

## 2021-02-28 ENCOUNTER — Other Ambulatory Visit: Payer: Self-pay | Admitting: *Deleted

## 2021-02-28 DIAGNOSIS — D329 Benign neoplasm of meninges, unspecified: Secondary | ICD-10-CM

## 2021-03-10 ENCOUNTER — Ambulatory Visit
Admission: RE | Admit: 2021-03-10 | Discharge: 2021-03-10 | Disposition: A | Discharge status: Home / Self Care | Payer: Medicare Other | Source: Ambulatory Visit | Attending: Internal Medicine | Admitting: Internal Medicine

## 2021-03-10 DIAGNOSIS — Z8546 Personal history of malignant neoplasm of prostate: Secondary | ICD-10-CM | POA: Diagnosis not present

## 2021-03-10 DIAGNOSIS — D329 Benign neoplasm of meninges, unspecified: Secondary | ICD-10-CM

## 2021-03-10 DIAGNOSIS — G319 Degenerative disease of nervous system, unspecified: Secondary | ICD-10-CM | POA: Diagnosis not present

## 2021-03-10 MED ORDER — IOPAMIDOL (ISOVUE-300) INJECTION 61%
75.0000 mL | Freq: Once | INTRAVENOUS | Status: AC | PRN
  Administered 2021-03-10: 75 mL via INTRAVENOUS

## 2021-03-13 ENCOUNTER — Inpatient Hospital Stay: Payer: Medicare Other | Attending: Internal Medicine

## 2021-03-13 DIAGNOSIS — G8929 Other chronic pain: Secondary | ICD-10-CM | POA: Insufficient documentation

## 2021-03-13 DIAGNOSIS — D329 Benign neoplasm of meninges, unspecified: Secondary | ICD-10-CM | POA: Insufficient documentation

## 2021-03-13 DIAGNOSIS — Z87891 Personal history of nicotine dependence: Secondary | ICD-10-CM | POA: Insufficient documentation

## 2021-03-13 DIAGNOSIS — M549 Dorsalgia, unspecified: Secondary | ICD-10-CM | POA: Insufficient documentation

## 2021-03-14 ENCOUNTER — Other Ambulatory Visit: Payer: Self-pay

## 2021-03-14 ENCOUNTER — Inpatient Hospital Stay (HOSPITAL_BASED_OUTPATIENT_CLINIC_OR_DEPARTMENT_OTHER): Payer: Medicare Other | Admitting: Internal Medicine

## 2021-03-14 VITALS — BP 157/69 | HR 77 | Temp 97.2°F | Resp 16 | Wt 228.4 lb

## 2021-03-14 DIAGNOSIS — D329 Benign neoplasm of meninges, unspecified: Secondary | ICD-10-CM | POA: Diagnosis not present

## 2021-03-14 DIAGNOSIS — M549 Dorsalgia, unspecified: Secondary | ICD-10-CM | POA: Diagnosis not present

## 2021-03-14 DIAGNOSIS — G8929 Other chronic pain: Secondary | ICD-10-CM | POA: Diagnosis not present

## 2021-03-14 DIAGNOSIS — Z87891 Personal history of nicotine dependence: Secondary | ICD-10-CM | POA: Diagnosis not present

## 2021-03-14 NOTE — Progress Notes (Signed)
? ?[facility] [address]  ?[place], [place] [address] ?[phone] ? ? ?Interval Evaluation ? ?Date of Service: 03/14/21 ?[name] ?Patient MRN: [id number] ?Patient DOB: [birth year] ?Provider: Ventura Sellers, MD ? ?Identifying Statement:  ?Cody Tate is a 71 y.o. male with  right tentorial  meningioma  ? ?Referring Provider: ?[name], MD ?[address] ?[address] ?[place],  [place] [address] ? 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 ?Interval History: ? Cody Tate presents today for meningioma follow up after recent CT head.  Visual changes are static, nothing progressive at this time.  Walking is limited by chronic back pain, presents today in a wheelchair.  He otherwise denies new or progressive changes today. ? ?Medications: ?Current Outpatient Medications on File Prior to Visit  ?Medication Sig Dispense Refill  ? montelukast (SINGULAIR) 10 MG tablet TAKE 1 TABLET BY MOUTH AT  BEDTIME 90 tablet 3  ? Ascorbic Acid (VITAMIN C) 1000 MG tablet Take 1,000 mg by mouth daily.    ? budesonide (PULMICORT FLEXHALER) 180 MCG/ACT inhaler Inhale 2 puffs into the lungs in the morning and at bedtime. (Patient taking differently: Inhale 2 puffs into the lungs at bedtime as needed (shortness of breath or wheezing).) 3 each 3  ? Cholecalciferol (VITAMIN D3) 50 MCG (2000 UT) capsule Take 4,000 Units by mouth daily.    ? diphenhydrAMINE (BENADRYL) 25 mg capsule Take 25 mg by mouth at bedtime as needed for sleep.    ? finasteride (PROSCAR) 5 MG tablet Take 5 mg by mouth daily.    ? fluticasone (FLONASE) 50 MCG/ACT nasal spray Place 2 sprays into both nostrils daily.     ? furosemide (LASIX) 20 MG tablet Take 1 tablet by mouth once daily 90 tablet 1  ? gabapentin (NEURONTIN) 300 MG capsule Take 300 mg by mouth 3 (three) times daily.     ? guaifenesin (HUMIBID E) 400 MG TABS Take 400 mg by mouth every 4 (four) hours as needed (for  congestion or mucus).     ? ibuprofen (ADVIL,MOTRIN) 200 MG tablet Take 600 mg by mouth in the morning, at noon, in the evening, and at bedtime.    ? Ipratropium-Albuterol (COMBIVENT RESPIMAT) 20-100 MCG/ACT AERS respimat Inhale 1 puff into the lungs every 6 (six) hours as needed for wheezing or shortness of breath. 12 g 3  ? latanoprost (XALATAN) 0.005 % ophthalmic solution Place 1 drop into both eyes at bedtime.     ? magnesium hydroxide (MILK OF MAGNESIA) 400 MG/5ML suspension Take 15 mLs by mouth at bedtime.    ? metoprolol succinate (TOPROL-XL) 100 MG 24 hr tablet Take 100 mg by mouth at bedtime. Take with or immediately following a meal.    ? Multiple Vitamin (MULTIVITAMIN WITH MINERALS) TABS tablet Take 1 tablet by mouth daily.    ? Multiple Vitamins-Minerals (PRESERVISION AREDS 2+MULTI VIT) CAPS Take 1 capsule by mouth in the morning and at bedtime.    ? omeprazole (PRILOSEC) 40 MG capsule Take 1 capsule (40 mg total) by mouth daily before breakfast. 90 capsule 3  ? Oxycodone HCl 10 MG TABS Take 10 mg by mouth every 4 (four) hours.    ? oxymetazoline (AFRIN) 0.05 % nasal spray Place 2 sprays into both nostrils 2 (two) times daily as needed for congestion.    ? Polyvinyl Alcohol-Povidone PF 1.4-0.6 % SOLN Place 2 drops into both eyes 3 (three) times daily  as needed (for dry eyes).    ? ramipril (ALTACE) 10 MG capsule Take 10 mg by mouth every morning.     ? Simethicone 125 MG CAPS Take 125 mg by mouth 4 (four) times daily as needed (for gas).     ? simvastatin (ZOCOR) 20 MG tablet Take 20 mg by mouth at bedtime.     ? sodium chloride (OCEAN) 0.65 % SOLN nasal spray Place 1 spray into both nostrils as needed for congestion. As needed    ? spironolactone (ALDACTONE) 25 MG tablet Take 12.5 mg by mouth at bedtime.     ? Tamsulosin HCl (FLOMAX) 0.4 MG CAPS Take 0.4 mg by mouth 2 (two) times daily.    ? TRELEGY ELLIPTA 200-62.5-25 MCG/ACT AEPB USE 1 INHALATION BY MOUTH  DAILY 180 each 3  ? ?No current  facility-administered medications on file prior to visit.  ? ? ?Allergies:  ?Allergies  ?Allergen Reactions  ? Adhesive [Tape] Itching, Rash and Other (See Comments)  ?  Occlusive tape and bandaids  ? Other Itching, Rash and Other (See Comments)  ?  VICRYL Sutures  ? ?Past Medical History:  ?Past Medical History:  ?Diagnosis Date  ? Arthritis   ? BPH (benign prostatic hyperplasia)   ? Cancer [facility])   ? prostate cancer  ? COPD (chronic obstructive pulmonary disease) ([place])   ? Dyspnea   ? Enlarged prostate   ? GERD (gastroesophageal reflux disease)   ? Glaucoma   ? H/O hiatal hernia   ? H/O wheezing   ? occ inhaler usage  ? Heart murmur   ? Hypertension   ? Meningioma ([facility])   ? PONV (postoperative nausea and vomiting)   ? Pulmonary nodules   ? resolved on 2019 follow up CT  ? ?Past Surgical History:  ?Past Surgical History:  ?Procedure Laterality Date  ? BACK SURGERY  06,07,08  ? lam x3 cerv x 1  ? CERVICAL FUSION    ? CYSTOSCOPY    ? 71 yrs old  ? EYE SURGERY    ? bil  ? GOLD SEED IMPLANT N/A 09/06/2020  ? Procedure: GOLD SEED IMPLANT/ PLACEMENT OF FIDUCIAL MARKERS;  Surgeon: Robley Fries, MD;  Location: [facility];  Service: Urology;  Laterality: N/A;  ? KNEE ARTHROSCOPY  10  ? lft  ? KNEE ARTHROSCOPY Left 07/23/2012  ? Procedure: LEFT MEDIAL MENISCAL DEBRIDEMENT AND CHONDROPLASTY;  Surgeon: [name], MD;  Location: [facility];  Service: Orthopedics;  Laterality: Left;  ? LUMBAR FUSION  02/22/2014  ? DR POOL  ? PILONIDAL CYST EXCISION    ? SINUS EXPLORATION    ? SPACE OAR INSTILLATION N/A 09/06/2020  ? Procedure: SPACE OAR INSTILLATION;  Surgeon: Robley Fries, MD;  Location: [facility];  Service: Urology;  Laterality: N/A;  ? SPINAL CORD STIMULATOR INSERTION N/A 01/16/2018  ? Procedure: LUMBAR SPINAL CORD STIMULATOR INSERTION;  Surgeon: [name], MD;  Location: [facility];  Service: Neurosurgery;  Laterality: N/A;  LUMBAR SPINAL CORD STIMULATOR INSERTION  ? TRANSURETHRAL RESECTION OF PROSTATE N/A 09/06/2020  ?  Procedure: TRANSURETHRAL RESECTION OF THE PROSTATE (TURP);  Surgeon: Robley Fries, MD;  Location: [facility];  Service: Urology;  Laterality: N/A;  2 HRS  ? WRIST GANGLION EXCISION    ? rt  ? WRIST SURGERY  09  ? cyst lft  ? ?Social History:  ?Social History  ? ?Socioeconomic History  ? Marital status: Married  ?  Spouse name: Not on file  ?  Number of children: Not on file  ? Years of education: Not on file  ? Highest education level: Not on file  ?Occupational History  ? Not on file  ?Tobacco Use  ? Smoking status: Former  ?  Packs/day: 1.50  ?  Years: 35.00  ?  Pack years: 52.50  ?  Types: Cigarettes  ?  Quit date: 05/03/2000  ?  Years since quitting: 20.8  ? Smokeless tobacco: Never  ? Tobacco comments:  ?  quit alcohol 73  ?Vaping Use  ? Vaping Use: Never used  ?Substance and Sexual Activity  ? Alcohol use: Not Currently  ?  Comment: hx of years ago  ? Drug use: No  ? Sexual activity: Not Currently  ?Other Topics Concern  ? Not on file  ?Social History Narrative  ? 08-20-17 Unable to ask abuse questions wife with him today.  ? ?Social Determinants of Health  ? ?Financial Resource Strain: Not on file  ?Food Insecurity: Not on file  ?Transportation Needs: Not on file  ?Physical Activity: Not on file  ?Stress: Not on file  ?Social Connections: Not on file  ?Intimate Partner Violence: Not on file  ? ?Family History:  ?Family History  ?Problem Relation Age of Onset  ? COPD Maternal Uncle   ? ? ?Review of Systems: ?Constitutional: Denies fevers, chills or abnormal weight loss ?Eyes: Denies blurriness of vision ?Ears, nose, mouth, throat, and face: Denies mucositis or sore throat ?Respiratory: Denies cough, dyspnea or wheezes ?Cardiovascular: Denies palpitation, chest discomfort or lower extremity swelling ?Gastrointestinal:  Denies nausea, constipation, diarrhea ?GU: Denies dysuria or incontinence ?Skin: Denies abnormal skin rashes ?Neurological: Per HPI ?Musculoskeletal: Denies joint pain, back or neck discomfort. No  decrease in ROM ?Behavioral/Psych: Denies anxiety, disturbance in thought content, and mood instability ? ?Physical Exam: ?Vitals:  ? 03/14/21 1141  ?BP: (!) 157/69  ?Pulse: 77  ?Resp: 16  ?Temp: (!) 97.2 ?F (36.2 ?C)  ?SpO2:

## 2021-03-20 DIAGNOSIS — Z20828 Contact with and (suspected) exposure to other viral communicable diseases: Secondary | ICD-10-CM | POA: Diagnosis not present

## 2021-03-20 DIAGNOSIS — Z1152 Encounter for screening for COVID-19: Secondary | ICD-10-CM | POA: Diagnosis not present

## 2021-03-22 DIAGNOSIS — Z20822 Contact with and (suspected) exposure to covid-19: Secondary | ICD-10-CM | POA: Diagnosis not present

## 2021-03-23 DIAGNOSIS — D329 Benign neoplasm of meninges, unspecified: Secondary | ICD-10-CM | POA: Diagnosis not present

## 2021-03-23 DIAGNOSIS — Z79899 Other long term (current) drug therapy: Secondary | ICD-10-CM | POA: Diagnosis not present

## 2021-03-23 DIAGNOSIS — Z0000 Encounter for general adult medical examination without abnormal findings: Secondary | ICD-10-CM | POA: Diagnosis not present

## 2021-03-23 DIAGNOSIS — K9089 Other intestinal malabsorption: Secondary | ICD-10-CM | POA: Diagnosis not present

## 2021-03-23 DIAGNOSIS — M5441 Lumbago with sciatica, right side: Secondary | ICD-10-CM | POA: Diagnosis not present

## 2021-03-23 DIAGNOSIS — E7800 Pure hypercholesterolemia, unspecified: Secondary | ICD-10-CM | POA: Diagnosis not present

## 2021-03-23 DIAGNOSIS — I10 Essential (primary) hypertension: Secondary | ICD-10-CM | POA: Diagnosis not present

## 2021-03-23 DIAGNOSIS — H353231 Exudative age-related macular degeneration, bilateral, with active choroidal neovascularization: Secondary | ICD-10-CM | POA: Diagnosis not present

## 2021-03-23 DIAGNOSIS — I700 Atherosclerosis of aorta: Secondary | ICD-10-CM | POA: Diagnosis not present

## 2021-03-23 DIAGNOSIS — J449 Chronic obstructive pulmonary disease, unspecified: Secondary | ICD-10-CM | POA: Diagnosis not present

## 2021-03-23 DIAGNOSIS — C61 Malignant neoplasm of prostate: Secondary | ICD-10-CM | POA: Diagnosis not present

## 2021-03-23 DIAGNOSIS — I872 Venous insufficiency (chronic) (peripheral): Secondary | ICD-10-CM | POA: Diagnosis not present

## 2021-03-23 DIAGNOSIS — G894 Chronic pain syndrome: Secondary | ICD-10-CM | POA: Diagnosis not present

## 2021-03-27 ENCOUNTER — Ambulatory Visit: Payer: Medicare Other | Admitting: Internal Medicine

## 2021-03-28 ENCOUNTER — Telehealth: Payer: Self-pay | Admitting: *Deleted

## 2021-04-12 DIAGNOSIS — L02412 Cutaneous abscess of left axilla: Secondary | ICD-10-CM | POA: Diagnosis not present

## 2021-04-12 DIAGNOSIS — L089 Local infection of the skin and subcutaneous tissue, unspecified: Secondary | ICD-10-CM | POA: Diagnosis not present

## 2021-04-12 DIAGNOSIS — R748 Abnormal levels of other serum enzymes: Secondary | ICD-10-CM | POA: Diagnosis not present

## 2021-04-14 DIAGNOSIS — Z20822 Contact with and (suspected) exposure to covid-19: Secondary | ICD-10-CM | POA: Diagnosis not present

## 2021-04-17 DIAGNOSIS — H353231 Exudative age-related macular degeneration, bilateral, with active choroidal neovascularization: Secondary | ICD-10-CM | POA: Diagnosis not present

## 2021-04-17 DIAGNOSIS — H43813 Vitreous degeneration, bilateral: Secondary | ICD-10-CM | POA: Diagnosis not present

## 2021-04-17 DIAGNOSIS — H401132 Primary open-angle glaucoma, bilateral, moderate stage: Secondary | ICD-10-CM | POA: Diagnosis not present

## 2021-04-21 ENCOUNTER — Telehealth: Payer: Self-pay | Admitting: *Deleted

## 2021-04-25 ENCOUNTER — Other Ambulatory Visit: Payer: Self-pay

## 2021-04-25 ENCOUNTER — Encounter: Payer: Self-pay | Admitting: *Deleted

## 2021-04-25 ENCOUNTER — Inpatient Hospital Stay: Payer: Medicare Other | Attending: Internal Medicine | Admitting: *Deleted

## 2021-04-25 VITALS — BP 130/55 | HR 98 | Temp 98.2°F | Resp 18 | Ht 66.5 in | Wt 226.2 lb

## 2021-04-25 DIAGNOSIS — C61 Malignant neoplasm of prostate: Secondary | ICD-10-CM | POA: Diagnosis not present

## 2021-04-25 NOTE — Progress Notes (Signed)
?  SCP reviewed and completed. Pt's vital signs were within normal range.  Pt rates pain to lower back and both legs a 3/10. Pt has a hx of chronic back pain. Allergy and medication list reviewed.Pt walks with a rolling walker. Pt doesn't get much regular exercise because of ongoing back and leg pain. I did mentioned to him about trying some chair exercises. He is concerned about his weight and don't want to gain anymore. Pt is tolerating side effects from ADT. He has some hotflashes and fatigue, but he says he gets through it.  ?PT's last PSA was undetectable. Pt says he is still experiencing some urinary frequency and nocturia 3-4 x/ nightly. Reviewed the [employer] and discussed healthier eating choices.Pt states he has some discomfort and burning with urination. He takes AZO once a day which he says helps. I did tell him to discuss this with urologist next week when he sees her. Last colonoscopy done in 2020. SCP reviewed and completed. ?

## 2021-05-01 DIAGNOSIS — Z20822 Contact with and (suspected) exposure to covid-19: Secondary | ICD-10-CM | POA: Diagnosis not present

## 2021-05-03 DIAGNOSIS — N401 Enlarged prostate with lower urinary tract symptoms: Secondary | ICD-10-CM | POA: Diagnosis not present

## 2021-05-03 DIAGNOSIS — R300 Dysuria: Secondary | ICD-10-CM | POA: Diagnosis not present

## 2021-05-03 DIAGNOSIS — R351 Nocturia: Secondary | ICD-10-CM | POA: Diagnosis not present

## 2021-05-03 DIAGNOSIS — Z20822 Contact with and (suspected) exposure to covid-19: Secondary | ICD-10-CM | POA: Diagnosis not present

## 2021-05-03 DIAGNOSIS — C61 Malignant neoplasm of prostate: Secondary | ICD-10-CM | POA: Diagnosis not present

## 2021-05-10 DIAGNOSIS — L0293 Carbuncle, unspecified: Secondary | ICD-10-CM | POA: Diagnosis not present

## 2021-05-10 DIAGNOSIS — L03113 Cellulitis of right upper limb: Secondary | ICD-10-CM | POA: Diagnosis not present

## 2021-05-20 DIAGNOSIS — Z20828 Contact with and (suspected) exposure to other viral communicable diseases: Secondary | ICD-10-CM | POA: Diagnosis not present

## 2021-05-24 DIAGNOSIS — L089 Local infection of the skin and subcutaneous tissue, unspecified: Secondary | ICD-10-CM | POA: Diagnosis not present

## 2021-05-24 DIAGNOSIS — Z23 Encounter for immunization: Secondary | ICD-10-CM | POA: Diagnosis not present

## 2021-05-26 DIAGNOSIS — Z20828 Contact with and (suspected) exposure to other viral communicable diseases: Secondary | ICD-10-CM | POA: Diagnosis not present

## 2021-05-31 DIAGNOSIS — H353232 Exudative age-related macular degeneration, bilateral, with inactive choroidal neovascularization: Secondary | ICD-10-CM | POA: Diagnosis not present

## 2021-05-31 DIAGNOSIS — H401331 Pigmentary glaucoma, bilateral, mild stage: Secondary | ICD-10-CM | POA: Diagnosis not present

## 2021-05-31 DIAGNOSIS — H31003 Unspecified chorioretinal scars, bilateral: Secondary | ICD-10-CM | POA: Diagnosis not present

## 2021-05-31 DIAGNOSIS — H02402 Unspecified ptosis of left eyelid: Secondary | ICD-10-CM | POA: Diagnosis not present

## 2021-06-26 DIAGNOSIS — H353231 Exudative age-related macular degeneration, bilateral, with active choroidal neovascularization: Secondary | ICD-10-CM | POA: Diagnosis not present

## 2021-07-26 DIAGNOSIS — C61 Malignant neoplasm of prostate: Secondary | ICD-10-CM | POA: Diagnosis not present

## 2021-08-09 ENCOUNTER — Ambulatory Visit (HOSPITAL_COMMUNITY)
Admission: RE | Admit: 2021-08-09 | Discharge: 2021-08-09 | Disposition: A | Discharge status: Home / Self Care | Payer: Medicare Other | Source: Ambulatory Visit | Attending: Internal Medicine | Admitting: Internal Medicine

## 2021-08-09 ENCOUNTER — Other Ambulatory Visit (HOSPITAL_COMMUNITY): Payer: Self-pay | Admitting: Internal Medicine

## 2021-08-09 ENCOUNTER — Other Ambulatory Visit: Payer: Self-pay | Admitting: Internal Medicine

## 2021-08-09 DIAGNOSIS — M7989 Other specified soft tissue disorders: Secondary | ICD-10-CM | POA: Diagnosis not present

## 2021-08-09 DIAGNOSIS — L03115 Cellulitis of right lower limb: Secondary | ICD-10-CM | POA: Diagnosis not present

## 2021-08-09 DIAGNOSIS — L0292 Furuncle, unspecified: Secondary | ICD-10-CM | POA: Diagnosis not present

## 2021-08-09 NOTE — Progress Notes (Signed)
Right lower extremity venous duplex has been completed. Preliminary results can be found in CV Proc through chart review.  Results were given to Dr. Koleen Nimrod.  08/09/21 4:10 PM Cody Tate RVT

## 2021-08-16 DIAGNOSIS — R350 Frequency of micturition: Secondary | ICD-10-CM | POA: Diagnosis not present

## 2021-08-16 DIAGNOSIS — R300 Dysuria: Secondary | ICD-10-CM | POA: Diagnosis not present

## 2021-08-16 DIAGNOSIS — Z5111 Encounter for antineoplastic chemotherapy: Secondary | ICD-10-CM | POA: Diagnosis not present

## 2021-08-16 DIAGNOSIS — N401 Enlarged prostate with lower urinary tract symptoms: Secondary | ICD-10-CM | POA: Diagnosis not present

## 2021-08-16 DIAGNOSIS — R351 Nocturia: Secondary | ICD-10-CM | POA: Diagnosis not present

## 2021-08-16 DIAGNOSIS — C61 Malignant neoplasm of prostate: Secondary | ICD-10-CM | POA: Diagnosis not present

## 2021-08-18 DIAGNOSIS — R600 Localized edema: Secondary | ICD-10-CM | POA: Diagnosis not present

## 2021-08-18 DIAGNOSIS — L0293 Carbuncle, unspecified: Secondary | ICD-10-CM | POA: Diagnosis not present

## 2021-08-18 DIAGNOSIS — Z22322 Carrier or suspected carrier of Methicillin resistant Staphylococcus aureus: Secondary | ICD-10-CM | POA: Diagnosis not present

## 2021-08-20 ENCOUNTER — Other Ambulatory Visit: Payer: Self-pay | Admitting: Pulmonary Disease

## 2021-08-25 DIAGNOSIS — H43813 Vitreous degeneration, bilateral: Secondary | ICD-10-CM | POA: Diagnosis not present

## 2021-08-25 DIAGNOSIS — H401132 Primary open-angle glaucoma, bilateral, moderate stage: Secondary | ICD-10-CM | POA: Diagnosis not present

## 2021-08-25 DIAGNOSIS — H353231 Exudative age-related macular degeneration, bilateral, with active choroidal neovascularization: Secondary | ICD-10-CM | POA: Diagnosis not present

## 2021-09-13 ENCOUNTER — Other Ambulatory Visit: Payer: Self-pay | Admitting: Urology

## 2021-09-13 ENCOUNTER — Telehealth: Payer: Self-pay

## 2021-09-13 DIAGNOSIS — N99114 Postprocedural urethral stricture, male, unspecified: Secondary | ICD-10-CM | POA: Diagnosis not present

## 2021-09-13 DIAGNOSIS — R3912 Poor urinary stream: Secondary | ICD-10-CM | POA: Diagnosis not present

## 2021-09-13 DIAGNOSIS — R300 Dysuria: Secondary | ICD-10-CM | POA: Diagnosis not present

## 2021-09-13 DIAGNOSIS — C61 Malignant neoplasm of prostate: Secondary | ICD-10-CM | POA: Diagnosis not present

## 2021-09-13 NOTE — Patient Outreach (Signed)
  Care Coordination   09/13/2021 Name: Cody Tate MRN: [id number] DOB: [birth year]   Care Coordination Outreach Attempts:  An unsuccessful telephone outreach was attempted today to offer the patient information about available care coordination services as a benefit of their health plan.   Follow Up Plan:  Additional outreach attempts will be made to offer the patient care coordination information and services.   Encounter Outcome:  No Answer  Care Coordination Interventions Activated:  No   Care Coordination Interventions:  No, not indicated    [facility] [phone]

## 2021-09-15 NOTE — Patient Instructions (Signed)
SURGICAL WAITING ROOM VISITATION Patients having surgery or a procedure may have no more than 2 support people in the waiting area - these visitors may rotate.   Children under the age of 2 must have an adult with them who is not the patient. If the patient needs to stay at the hospital during part of their recovery, the visitor guidelines for inpatient rooms apply. Pre-op nurse will coordinate an appropriate time for 1 support person to accompany patient in pre-op.  This support person may not rotate.    Please refer to the [facility] website for the visitor guidelines for Inpatients (after your surgery is over and you are in a regular room).    Your procedure is scheduled on: 09/21/21   Report to [facility] Main Entrance    Report to admitting at 9:30 AM   Call this number if you have problems the morning of surgery [phone]   Do not eat food :After Midnight.   After Midnight you may have the following liquids until 8:45 AM DAY OF SURGERY  Water Non-Citrus Juices (without pulp, NO RED) Carbonated Beverages Black Coffee (NO MILK/CREAM OR CREAMERS, sugar ok)  Clear Tea (NO MILK/CREAM OR CREAMERS, sugar ok) regular and decaf                             Plain Jell-O (NO RED)                                           Fruit ices (not with fruit pulp, NO RED)                                     Popsicles (NO RED)                                                               Sports drinks like Gatorade (NO RED)  FOLLOW BOWEL PREP AND ANY ADDITIONAL PRE OP INSTRUCTIONS YOU RECEIVED FROM YOUR SURGEON'S OFFICE!!!     Oral Hygiene is also important to reduce your risk of infection.                                    Remember - BRUSH YOUR TEETH THE MORNING OF SURGERY WITH YOUR REGULAR TOOTHPASTE   Take these medicines the morning of surgery with A SIP OF WATER: Finasteride, Gabapentin, Omeprazole, Oxycodone, Flomax                              You may not have any metal on  your body including jewelry, and body piercing             Do not wear lotions, powders, cologne, or deodorant              Men may shave face and neck.   Do not bring valuables to the hospital. [facility] IS NOT  RESPONSIBLE   FOR VALUABLES.   Contacts, dentures or bridgework may not be worn into surgery.  DO NOT [place]. PHARMACY WILL DISPENSE MEDICATIONS LISTED ON YOUR MEDICATION LIST TO YOU DURING YOUR ADMISSION [facility]!    Patients discharged on the day of surgery will not be allowed to drive home.  Someone NEEDS to stay with you for the first 24 hours after anesthesia.   Special Instructions: Bring a copy of your healthcare power of attorney and living will documents         the day of surgery if you haven't scanned them before.              Please read over the following fact sheets you were given: IF YOU HAVE QUESTIONS ABOUT YOUR PRE-OP INSTRUCTIONS PLEASE CALL [facility] - Preparing for Surgery Before surgery, you can play an important role.  Because skin is not sterile, your skin needs to be as free of germs as possible.  You can reduce the number of germs on your skin by washing with CHG (chlorahexidine gluconate) soap before surgery.  CHG is an antiseptic cleaner which kills germs and bonds with the skin to continue killing germs even after washing. Please DO NOT use if you have an allergy to CHG or antibacterial soaps.  If your skin becomes reddened/irritated stop using the CHG and inform your nurse when you arrive at Short Stay. Do not shave (including legs and underarms) for at least 48 hours prior to the first CHG shower.  You may shave your face/neck.  Please follow these instructions carefully:  1.  Shower with CHG Soap the night before surgery and the  morning of surgery.  2.  If you choose to wash your hair, wash your hair first as usual with your normal  shampoo.  3.  After you shampoo,  rinse your hair and body thoroughly to remove the shampoo.                             4.  Use CHG as you would any other liquid soap.  You can apply chg directly to the skin and wash.  Gently with a scrungie or clean washcloth.  5.  Apply the CHG Soap to your body ONLY FROM THE NECK DOWN.   Do   not use on face/ open                           Wound or open sores. Avoid contact with eyes, ears mouth and   genitals (private parts).                       Wash face,  Genitals (private parts) with your normal soap.             6.  Wash thoroughly, paying special attention to the area where your    surgery  will be performed.  7.  Thoroughly rinse your body with warm water from the neck down.  8.  DO NOT shower/wash with your normal soap after using and rinsing off the CHG Soap.                9.  Pat yourself dry with a clean towel.            10.  Wear clean  pajamas.            11.  Place clean sheets on your bed the night of your first shower and do not  sleep with pets. Day of Surgery : Do not apply any lotions/deodorants the morning of surgery.  Please wear clean clothes to the hospital/surgery center.  FAILURE TO FOLLOW THESE INSTRUCTIONS MAY RESULT IN THE CANCELLATION OF YOUR SURGERY  PATIENT SIGNATURE_________________________________  NURSE SIGNATURE__________________________________  ________________________________________________________________________

## 2021-09-15 NOTE — Progress Notes (Signed)
COVID Vaccine Completed: yes x5  Date of COVID positive in last 90 days:  PCP - [name], MD Cardiologist - [name], MD  Chest x-ray -  EKG -  Stress Test -01/10/18 Epic  ECHO - 06/27/20 Epic Cardiac Cath -  Pacemaker/ICD device last checked: Spinal Cord Stimulator:  Bowel Prep -   Sleep Study -  CPAP -   Fasting Blood Sugar -  Checks Blood Sugar _____ times a day  Blood Thinner Instructions: Aspirin Instructions: ASA 81 Last Dose:  Activity level:  Can go up a flight of stairs and perform activities of daily living without stopping and without symptoms of chest pain or shortness of breath.  Able to exercise without symptoms  Unable to go up a flight of stairs without symptoms of     Anesthesia review:   Patient denies shortness of breath, fever, cough and chest pain at PAT appointment  Patient verbalized understanding of instructions that were given to them at the PAT appointment. Patient was also instructed that they will need to review over the PAT instructions again at home before surgery.

## 2021-09-18 ENCOUNTER — Encounter (HOSPITAL_COMMUNITY): Payer: Self-pay

## 2021-09-18 ENCOUNTER — Encounter (HOSPITAL_COMMUNITY)
Admission: RE | Admit: 2021-09-18 | Discharge: 2021-09-18 | Disposition: A | Discharge status: Home / Self Care | Payer: Medicare Other | Source: Ambulatory Visit | Attending: Urology | Admitting: Urology

## 2021-09-18 VITALS — BP 157/74 | HR 63 | Temp 97.8°F | Resp 14 | Ht 66.5 in | Wt 225.8 lb

## 2021-09-18 DIAGNOSIS — Z01818 Encounter for other preprocedural examination: Secondary | ICD-10-CM | POA: Insufficient documentation

## 2021-09-18 DIAGNOSIS — I2510 Atherosclerotic heart disease of native coronary artery without angina pectoris: Secondary | ICD-10-CM | POA: Insufficient documentation

## 2021-09-18 HISTORY — DX: Unspecified macular degeneration: H35.30

## 2021-09-18 HISTORY — DX: Presence of other specified functional implants: Z96.89

## 2021-09-18 LAB — CBC
HCT: 42.8 % (ref 39.0–52.0)
Hemoglobin: 13.7 g/dL (ref 13.0–17.0)
MCH: 29.7 pg (ref 26.0–34.0)
MCHC: 32 g/dL (ref 30.0–36.0)
MCV: 92.8 fL (ref 80.0–100.0)
Platelets: 263 10*3/uL (ref 150–400)
RBC: 4.61 MIL/uL (ref 4.22–5.81)
RDW: 12.6 % (ref 11.5–15.5)
WBC: 6.2 10*3/uL (ref 4.0–10.5)
nRBC: 0 % (ref 0.0–0.2)

## 2021-09-18 LAB — BASIC METABOLIC PANEL
Anion gap: 8 (ref 5–15)
BUN: 22 mg/dL (ref 8–23)
CO2: 28 mmol/L (ref 22–32)
Calcium: 9.2 mg/dL (ref 8.9–10.3)
Chloride: 103 mmol/L (ref 98–111)
Creatinine, Ser: 0.99 mg/dL (ref 0.61–1.24)
GFR, Estimated: >60 mL/min (ref >60)
Glucose, Bld: 113 mg/dL — ABNORMAL HIGH (ref 70–99)
Potassium: 4.1 mmol/L (ref 3.5–5.1)
Sodium: 139 mmol/L (ref 135–145)

## 2021-09-20 NOTE — H&P (Signed)
CC/HPI: cc: prostate nodule   04/28/20: 71 year old man with a history of BPH with lower urinary tract symptoms currently managed with tamsulosin b.i.d. and recently started on Proscar referred for prostate nodule. Patient denies any family history of prostate cancer, hematuria or blood thinners. His most recent PSA was 1.85 on 03/21/2020. He has only been taking Proscar for 1 month. He has a weak urinary stream and nocturia 2-3 times a night. It is more of an inconvenience for him than anything else. His PVR in the office is 65 cc.   cT2a  High risk  Gleason 4+5=9 on biopsy 05/2020  Pre-biopsy PSA 1.85 (3.7 corrected)  [facility] TURP 09/06/20  IMRT completed January 2023  ADT started August 2022   08/16/2021: 71 year old man with a history of high risk prostate cancer s/p IMRT completed January 2023 currently on ADT here for follow-up. He is doing well overall and remains on tamsulosin 0.8 mg and finasteride 5 mg. He has intermittent pain and dysuria for which she takes AZO. His PSA remains undetectable. He is taking vitamin D and a multivitamin with calcium. Overall he is tolerating the ADT well.   09/13/2021: 71 year old man with the above history here for cystoscopy after reportedly passing something per urethra. He states his stream is very weak overall. He was unable to leave a urine sample today. He is going on vacation October 1.     ALLERGIES: Adhesive Band-Aid Tape Vicryl Sutures    MEDICATIONS: Aspirin  Doxycycline Hyclate  Metoprolol Succinate 100 mg tablet, extended release 24 hr  Simvastatin  Tamsulosin Hcl 0.4 mg capsule  Amoxicillin  Combivent Respimat 20 mcg-100 mcg/actuation mist inhaler  Fluticasone Propionate  Gabapentin 300 mg capsule  Ibuprofen  Lasix 20 mg tablet  Latanoprost 0.005 % drops  Montelukast Sodium 10 mg tablet  Mucus Relief  Oxycodone Hcl 10 mg tablet  Preservision Areds  Prilosec Otc  Proscar 5 mg tablet  Pulmicort Flexhaler 180 mcg/actuation (160  mcg delivered) aerosol powder, breath activated  Ramipril 10 mg capsule  Spironolactone 25 mg tablet  Sudafed  Trelegy Ellipta 200 mcg-62.5 mcg-25 mcg/actuation blister, with inhalation device     GU PSH: Cystoscopy TURP - 09/06/2020 Locm 300-'399Mg'$ /Ml Iodine,1Ml - 06/13/2020 PLACE RT DEVICE/MARKER, PROS - 09/06/2020 Prostate Needle Biopsy - 05/20/2020     NON-GU PSH: Back surgery Cataract surgery, Bilateral Knee Arthroscopy, Left Lumbar Spine Fusion Neck Surgery Sinus Surgery.. Surgical Pathology, Gross And Microscopic Examination For Prostate Needle - 05/20/2020 Wrist Arthroscopy, Left     GU PMH: BPH w/LUTS - 08/16/2021, - 05/03/2021, - 02/08/2021, - 09/08/2020, - 08/04/2020, - 06/30/2020, - 05/27/2020, Patient to continue tamsulosin b.i.d. and Proscar daily. We did discuss possibly proceeding with cysto and flowrate however will depend on prostatebiopsy results. , - 04/28/2020 Dysuria - 08/16/2021, - 05/03/2021, - 02/08/2021 Nocturia - 08/16/2021, - 05/03/2021, - 05/27/2020, - 04/28/2020 Prostate Cancer - 08/16/2021, - 05/03/2021, - 02/08/2021, - 10/05/2020, - 09/08/2020, - 08/04/2020, - 06/30/2020, - 06/13/2020, - 05/27/2020 Urinary Frequency - 08/16/2021, - 02/08/2021, - 09/08/2020, - 08/04/2020, - 04/28/2020 Urinary Retention - 10/05/2020, - 09/28/2020, - 09/21/2020, - 09/14/2020 Weak Urinary Stream - 09/08/2020, - 08/04/2020, - 06/30/2020, - 05/27/2020, - 04/28/2020 Prostate nodule w/ LUTS - 06/30/2020, - 05/20/2020, Patient's DRE with area of concern left lobe that feels significantly firmer/harder than right side. Discussed with patient proceeding with a TRUS prostate biopsy. The risks and benefits of a prostate biopsy were discussed with the patient including but not limited to infection, bleeding, need for  future treatment and injury to surrounding structures. Patient would like to proceed with biopsy. He was given instructions. , - 04/28/2020    NON-GU PMH: Acute gastric ulcer with hemorrhage Arrhythmia Arthritis Cardiac murmur,  unspecified COPD GERD Glaucoma Hypercholesterolemia Hypertension    FAMILY HISTORY: No Family History    SOCIAL HISTORY: Marital Status: Married Preferred Language: English; Race: White Current Smoking Status: Patient does not smoke anymore.   Tobacco Use Assessment Completed: Used Tobacco in last 30 days? Drinks 2 caffeinated drinks per day.    REVIEW OF SYSTEMS:    GU Review Male:   Patient denies frequent urination, hard to postpone urination, burning/ pain with urination, get up at night to urinate, leakage of urine, stream starts and stops, trouble starting your stream, have to strain to urinate , erection problems, and penile pain.  Gastrointestinal (Upper):   Patient denies nausea, vomiting, and indigestion/ heartburn.  Gastrointestinal (Lower):   Patient denies diarrhea and constipation.  Constitutional:   Patient denies fever, night sweats, weight loss, and fatigue.  Skin:   Patient denies skin rash/ lesion and itching.  Eyes:   Patient denies blurred vision and double vision.  Ears/ Nose/ Throat:   Patient denies sore throat and sinus problems.  Hematologic/Lymphatic:   Patient denies swollen glands and easy bruising.  Cardiovascular:   Patient denies leg swelling and chest pains.  Respiratory:   Patient denies cough and shortness of breath.  Endocrine:   Patient denies excessive thirst.  Musculoskeletal:   Patient denies back pain and joint pain.  Neurological:   Patient denies headaches and dizziness.  Psychologic:   Patient denies depression and anxiety.   VITAL SIGNS: None   MULTI-SYSTEM PHYSICAL EXAMINATION:    Constitutional: Well-nourished. No physical deformities. Normally developed. Good grooming.  Neck: Neck symmetrical, not swollen. Normal tracheal position.  Respiratory: No labored breathing, no use of accessory muscles.   Skin: No paleness, no jaundice, no cyanosis. No lesion, no ulcer, no rash.  Neurologic / Psychiatric: Oriented to time, oriented to  place, oriented to person. No depression, no anxiety, no agitation.  Eyes: Normal conjunctivae. Normal eyelids.  Ears, Nose, Mouth, and Throat: Left ear no scars, no lesions, no masses. Right ear no scars, no lesions, no masses. Nose no scars, no lesions, no masses. Normal hearing. Normal lips.  Musculoskeletal: Normal gait and station of head and neck.     Complexity of Data:  Records Review:   Previous Patient Records, POC Tool  Urine Test Review:   Urinalysis   07/26/21 04/25/21 02/02/21  PSA  Total PSA <0.015 ng/mL <0.015 ng/mL <0.015 ng/mL    PROCEDURES:         Flexible Cystoscopy - 52000  Risks, benefits, and some of the potential complications of the procedure were discussed at length with the patient including infection, bleeding, voiding discomfort, urinary retention, fever, chills, sepsis, and others. All questions were answered. Informed consent was obtained. Sterile technique and intraurethral analgesia were used.  Meatus:  Normal size. Normal location. Normal condition.  Urethra:  No strictures.  External Sphincter:  Normal.  Prostate:  No clear lumen seen in mid prostatic urethra. Unable to traverse cystoscope through. Appears to be obstructed by stricture.      The lower urinary tract was carefully examined until the level of the prostatic urethral stricture. The procedure was well-tolerated and without complications. Antibiotic instructions were given. Instructions were given to call the office immediately for bloody urine, difficulty urinating, urinary retention, painful or frequent  urination, fever, chills, nausea, vomiting or other illness. The patient stated that he understood these instructions and would comply with them.   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 Undiagnosed  New Problem   PLAN:           Document Letter(s):  Created for Patient: Clinical Summary         Notes:   Prostatic urethral stricture:  -Findings of diagnostic cystoscopy discussed with the patient and his wife  -I recommended proceeding with cystoscopy with urethral balloon dilation as I am able to see true lumen history was exceedingly weak  -Risks and benefits of the procedure discussed with the patient including bleeding, infection, retention, need for Foley catheter, recurrence, demonstrating structures, worsening urinary symptoms  -We will try and proceed to soon as possible as patient is going on vacation

## 2021-09-20 NOTE — Discharge Instructions (Signed)
Cystoscopy with Optilume patient instructions  Following a cystoscopy, a catheter (a flexible rubber tube) is sometimes left in place to empty the bladder. This may cause some discomfort or a feeling that you need to urinate. Your doctor determines the period of time that the catheter will be left in place. You may have bloody urine for two to three days (Call your doctor if the amount of bleeding increases or does not subside).  You may pass blood clots in your urine, especially if you had a biopsy. It is not unusual to pass small blood clots and have some bloody urine a couple of weeks after your cystoscopy. Again, call your doctor if the bleeding does not subside. You may have: Dysuria (painful urination) Frequency (urinating often) Urgency (strong desire to urinate)  Patient to use condom or abstain from sexual intercourse for 4 weeks following this procedure.  These symptoms are common especially if medicine is instilled into the bladder or a ureteral stent is placed. Avoiding alcohol and caffeine, such as coffee, tea, and chocolate, may help relieve these symptoms. Drink plenty of water, unless otherwise instructed. Your doctor may also prescribe an antibiotic or other medicine to reduce these symptoms.  Cystoscopy results are available soon after the procedure; biopsy results usually take two to four days. Your doctor will discuss the results of your exam with you. Before you go home, you will be given specific instructions for follow-up care. Special Instructions:   If you are going home with a catheter in place do not take a tub bath until removed by your doctor.   You may resume your normal activities.   Do not drive or operate machinery if you are taking narcotic pain medicine.   Be sure to keep all follow-up appointments with your doctor.   Call Your Doctor If: The catheter is not draining You have severe pain You are unable to urinate You have a fever over 101 You have severe  bleeding

## 2021-09-21 ENCOUNTER — Ambulatory Visit (HOSPITAL_COMMUNITY)
Admission: RE | Admit: 2021-09-21 | Discharge: 2021-09-21 | Disposition: A | Discharge status: Home / Self Care | Payer: Medicare Other | Source: Ambulatory Visit | Attending: Urology | Admitting: Urology

## 2021-09-21 ENCOUNTER — Ambulatory Visit (HOSPITAL_COMMUNITY): Payer: Medicare Other

## 2021-09-21 ENCOUNTER — Ambulatory Visit (HOSPITAL_BASED_OUTPATIENT_CLINIC_OR_DEPARTMENT_OTHER): Payer: Medicare Other | Admitting: Anesthesiology

## 2021-09-21 ENCOUNTER — Other Ambulatory Visit: Payer: Self-pay

## 2021-09-21 ENCOUNTER — Encounter (HOSPITAL_COMMUNITY)
Admission: RE | Disposition: A | Discharge status: Home / Self Care | Payer: Self-pay | Source: Ambulatory Visit | Attending: Urology

## 2021-09-21 ENCOUNTER — Encounter (HOSPITAL_COMMUNITY): Payer: Self-pay | Admitting: Urology

## 2021-09-21 ENCOUNTER — Ambulatory Visit (HOSPITAL_COMMUNITY): Payer: Medicare Other | Admitting: Physician Assistant

## 2021-09-21 DIAGNOSIS — N35913 Unspecified membranous urethral stricture, male: Secondary | ICD-10-CM

## 2021-09-21 DIAGNOSIS — N99112 Postprocedural membranous urethral stricture: Secondary | ICD-10-CM | POA: Diagnosis not present

## 2021-09-21 DIAGNOSIS — I509 Heart failure, unspecified: Secondary | ICD-10-CM | POA: Diagnosis not present

## 2021-09-21 DIAGNOSIS — Z8546 Personal history of malignant neoplasm of prostate: Secondary | ICD-10-CM | POA: Diagnosis not present

## 2021-09-21 DIAGNOSIS — Z923 Personal history of irradiation: Secondary | ICD-10-CM | POA: Insufficient documentation

## 2021-09-21 DIAGNOSIS — N35919 Unspecified urethral stricture, male, unspecified site: Secondary | ICD-10-CM | POA: Diagnosis not present

## 2021-09-21 DIAGNOSIS — Z87891 Personal history of nicotine dependence: Secondary | ICD-10-CM | POA: Insufficient documentation

## 2021-09-21 DIAGNOSIS — I110 Hypertensive heart disease with heart failure: Secondary | ICD-10-CM | POA: Diagnosis not present

## 2021-09-21 HISTORY — PX: CYSTOSCOPY WITH URETHRAL DILATATION: SHX5125

## 2021-09-21 SURGERY — CYSTOSCOPY, WITH URETHRAL DILATION
Anesthesia: General

## 2021-09-21 MED ORDER — ONDANSETRON HCL 4 MG/2ML IJ SOLN: 4.0000 mg | Freq: Once | INTRAMUSCULAR | Status: DC | PRN

## 2021-09-21 MED ORDER — OXYCODONE HCL 5 MG/5ML PO SOLN: 5.0000 mg | Freq: Once | ORAL | Status: DC | PRN

## 2021-09-21 MED ORDER — 0.9 % SODIUM CHLORIDE (POUR BTL) OPTIME
TOPICAL | Status: DC | PRN
  Administered 2021-09-21: 1000 mL

## 2021-09-21 MED ORDER — LIDOCAINE 2% (20 MG/ML) 5 ML SYRINGE
INTRAMUSCULAR | Status: DC | PRN
  Administered 2021-09-21: 80 mg via INTRAVENOUS

## 2021-09-21 MED ORDER — CHLORHEXIDINE GLUCONATE 0.12 % MT SOLN
15.0000 mL | Freq: Once | OROMUCOSAL | Status: AC
  Administered 2021-09-21: 15 mL via OROMUCOSAL

## 2021-09-21 MED ORDER — LACTATED RINGERS IV SOLN: INTRAVENOUS | Status: DC

## 2021-09-21 MED ORDER — DEXAMETHASONE SODIUM PHOSPHATE 4 MG/ML IJ SOLN
INTRAMUSCULAR | Status: DC | PRN
  Administered 2021-09-21: 5 mg via INTRAVENOUS

## 2021-09-21 MED ORDER — ORAL CARE MOUTH RINSE: 15.0000 mL | Freq: Once | OROMUCOSAL | Status: AC

## 2021-09-21 MED ORDER — PHENYLEPHRINE HCL (PRESSORS) 10 MG/ML IV SOLN
INTRAVENOUS | Status: DC | PRN
  Administered 2021-09-21 (×6): 80 ug via INTRAVENOUS

## 2021-09-21 MED ORDER — ACETAMINOPHEN 500 MG PO TABS
1000.0000 mg | ORAL_TABLET | Freq: Once | ORAL | Status: AC
  Administered 2021-09-21: 1000 mg via ORAL
  Filled 2021-09-21: qty 2

## 2021-09-21 MED ORDER — DEXAMETHASONE SODIUM PHOSPHATE 10 MG/ML IJ SOLN
INTRAMUSCULAR | Status: AC
  Filled 2021-09-21: qty 1

## 2021-09-21 MED ORDER — FENTANYL CITRATE (PF) 100 MCG/2ML IJ SOLN
INTRAMUSCULAR | Status: DC | PRN
  Administered 2021-09-21: 25 ug via INTRAVENOUS
  Administered 2021-09-21: 50 ug via INTRAVENOUS
  Administered 2021-09-21: 25 ug via INTRAVENOUS

## 2021-09-21 MED ORDER — HYDROMORPHONE HCL 1 MG/ML IJ SOLN: 0.2500 mg | INTRAMUSCULAR | Status: DC | PRN

## 2021-09-21 MED ORDER — AMISULPRIDE (ANTIEMETIC) 5 MG/2ML IV SOLN: 10.0000 mg | Freq: Once | INTRAVENOUS | Status: DC | PRN

## 2021-09-21 MED ORDER — LIDOCAINE HCL (PF) 2 % IJ SOLN
INTRAMUSCULAR | Status: AC
  Filled 2021-09-21: qty 5

## 2021-09-21 MED ORDER — FENTANYL CITRATE (PF) 100 MCG/2ML IJ SOLN
INTRAMUSCULAR | Status: AC
  Filled 2021-09-21: qty 2

## 2021-09-21 MED ORDER — EPHEDRINE SULFATE (PRESSORS) 50 MG/ML IJ SOLN
INTRAMUSCULAR | Status: DC | PRN
  Administered 2021-09-21: 10 mg via INTRAVENOUS
  Administered 2021-09-21: 5 mg via INTRAVENOUS
  Administered 2021-09-21: 10 mg via INTRAVENOUS

## 2021-09-21 MED ORDER — STERILE WATER FOR IRRIGATION IR SOLN
Status: DC | PRN
  Administered 2021-09-21: 500 mL

## 2021-09-21 MED ORDER — CEFAZOLIN SODIUM-DEXTROSE 2-4 GM/100ML-% IV SOLN
2.0000 g | INTRAVENOUS | Status: AC
  Administered 2021-09-21: 2 g via INTRAVENOUS
  Filled 2021-09-21: qty 100

## 2021-09-21 MED ORDER — ONDANSETRON HCL 4 MG/2ML IJ SOLN
INTRAMUSCULAR | Status: AC
  Filled 2021-09-21: qty 2

## 2021-09-21 MED ORDER — OXYCODONE HCL 5 MG PO TABS: 5.0000 mg | ORAL_TABLET | Freq: Once | ORAL | Status: DC | PRN

## 2021-09-21 MED ORDER — IOHEXOL 300 MG/ML  SOLN
INTRAMUSCULAR | Status: DC | PRN
  Administered 2021-09-21: 50 mL

## 2021-09-21 MED ORDER — PHENYLEPHRINE 80 MCG/ML (10ML) SYRINGE FOR IV PUSH (FOR BLOOD PRESSURE SUPPORT)
PREFILLED_SYRINGE | INTRAVENOUS | Status: AC
  Filled 2021-09-21: qty 10

## 2021-09-21 MED ORDER — PROPOFOL 10 MG/ML IV BOLUS
INTRAVENOUS | Status: DC | PRN
  Administered 2021-09-21: 30 mg via INTRAVENOUS
  Administered 2021-09-21: 200 mg via INTRAVENOUS

## 2021-09-21 MED ORDER — PROPOFOL 10 MG/ML IV BOLUS
INTRAVENOUS | Status: AC
  Filled 2021-09-21: qty 20

## 2021-09-21 MED ORDER — ONDANSETRON HCL 4 MG/2ML IJ SOLN
INTRAMUSCULAR | Status: DC | PRN
  Administered 2021-09-21: 4 mg via INTRAVENOUS

## 2021-09-21 MED ORDER — SODIUM CHLORIDE 0.9 % IR SOLN
Status: DC | PRN
  Administered 2021-09-21: 3000 mL

## 2021-09-21 SURGICAL SUPPLY — 35 items
BAG DRN RND TRDRP ANRFLXCHMBR (UROLOGICAL SUPPLIES) ×1
BAG URINE DRAIN 2000ML AR STRL (UROLOGICAL SUPPLIES) ×2 IMPLANT
BAG URINE LEG 500ML (DRAIN) IMPLANT
BAG URO CATCHER STRL LF (MISCELLANEOUS) ×2 IMPLANT
BALLN NEPHROSTOMY (BALLOONS) ×1
BALLN OPTILUME DCB 30X5X75 (BALLOONS) ×1
BALLOON NEPHROSTOMY (BALLOONS) IMPLANT
BALLOON OPTILUME DCB 30X5X75 (BALLOONS) IMPLANT
CATH FOLEY 2W COUNCIL 20FR 5CC (CATHETERS) IMPLANT
CATH FOLEY 2W COUNCIL 5CC 16FR (CATHETERS) IMPLANT
CATH FOLEY 3WAY 30CC 22FR (CATHETERS) IMPLANT
CATH HEMA 3WAY 30CC 22FR COUDE (CATHETERS) IMPLANT
CATH ROBINSON RED A/P 14FR (CATHETERS) IMPLANT
CATH URET 5FR 28IN CONE TIP (BALLOONS)
CATH URET 5FR 70CM CONE TIP (BALLOONS) IMPLANT
CATH URETL OPEN END 6FR 70 (CATHETERS) IMPLANT
CLOTH BEACON ORANGE TIMEOUT ST (SAFETY) ×2 IMPLANT
DRAPE FOOT SWITCH (DRAPES) ×2 IMPLANT
GLOVE BIO SURGEON STRL SZ 6.5 (GLOVE) ×2 IMPLANT
GOWN STRL REUS W/ TWL LRG LVL3 (GOWN DISPOSABLE) ×2 IMPLANT
GOWN STRL REUS W/TWL LRG LVL3 (GOWN DISPOSABLE) ×1
GUIDEWIRE ANG ZIPWIRE 038X150 (WIRE) IMPLANT
GUIDEWIRE STR DUAL SENSOR (WIRE) IMPLANT
HOLDER FOLEY CATH W/STRAP (MISCELLANEOUS) ×2 IMPLANT
LOOP CUT BIPOLAR 24F LRG (ELECTROSURGICAL) ×2 IMPLANT
MANIFOLD NEPTUNE II (INSTRUMENTS) ×2 IMPLANT
NS IRRIG 1000ML POUR BTL (IV SOLUTION) IMPLANT
PACK CYSTO (CUSTOM PROCEDURE TRAY) ×2 IMPLANT
PLUG CATH AND CAP STER (CATHETERS) ×2 IMPLANT
SYR 30ML LL (SYRINGE) IMPLANT
SYR TOOMEY IRRIG 70ML (MISCELLANEOUS)
SYRINGE TOOMEY IRRIG 70ML (MISCELLANEOUS) ×2 IMPLANT
TUBING CONNECTING 10 (TUBING) ×2 IMPLANT
TUBING UROLOGY SET (TUBING) ×2 IMPLANT
WATER STERILE IRR 3000ML UROMA (IV SOLUTION) ×2 IMPLANT

## 2021-09-21 NOTE — Interval H&P Note (Signed)
History and Physical Interval Note:  09/21/2021 10:52 AM  Cody Tate  has presented today for surgery, with the diagnosis of URETHRAL STRICTURE.  The various methods of treatment have been discussed with the patient and family. After consideration of risks, benefits and other options for treatment, the patient has consented to  Procedure(s): CYSTOSCOPY WITH BALLOON DILATION, OPTILUME OF  URETHRAL DILATATION (N/A) TRANSURETHRAL RESECTION OF THE PROSTATE (TURP) (N/A) as a surgical intervention.  The patient's history has been reviewed, patient examined, no change in status, stable for surgery.  I have reviewed the patient's chart and labs.  Questions were answered to the patient's satisfaction.     Gennavieve Huq D Javonn Gauger

## 2021-09-21 NOTE — Anesthesia Postprocedure Evaluation (Signed)
Anesthesia Post Note  Patient: Cody Tate  Procedure(s) Performed: CYSTOSCOPY WITH BALLOON DILATION and OPTILUME BALLOON DILATION OF  URETHRAL DILATATION     Patient location during evaluation: PACU Anesthesia Type: General Level of consciousness: awake and alert, oriented and patient cooperative Pain management: pain level controlled Vital Signs Assessment: post-procedure vital signs reviewed and stable Respiratory status: spontaneous breathing, nonlabored ventilation and respiratory function stable Cardiovascular status: blood pressure returned to baseline and stable Postop Assessment: no apparent nausea or vomiting Anesthetic complications: no   No notable events documented.  Last Vitals:  Vitals:   09/21/21 1218 09/21/21 1230  BP: (!) 141/70 (!) 148/69  Pulse: 75 70  Resp: 14 14  Temp: 36.6 C (!) 36.4 C  SpO2: 96% 97%    Last Pain:  Vitals:   09/21/21 1230  TempSrc:   PainSc: 0-No pain                 Pervis Hocking

## 2021-09-21 NOTE — Transfer of Care (Signed)
Immediate Anesthesia Transfer of Care Note  Patient: Cody Tate  Procedure(s) Performed: Procedure(s) (LRB): CYSTOSCOPY WITH BALLOON DILATION and OPTILUME BALLOON DILATION OF  URETHRAL DILATATION (N/A)  Patient Location: PACU  Anesthesia Type: General  Level of Consciousness: awake, sedated, patient cooperative and responds to stimulation  Airway & Oxygen Therapy: Patient Spontanous Breathing and Patient connected to face mask oxygen  Post-op Assessment: Report given to PACU RN, Post -op Vital signs reviewed and stable and Patient moving all extremities  Post vital signs: Reviewed and stable  Complications: No apparent anesthesia complications

## 2021-09-21 NOTE — Op Note (Signed)
Operative Note  Preoperative diagnosis:  1.  Membranous urethral stricture  Postoperative diagnosis: 1.  Membranous urethral stricture  Procedure(s): 1.  Cystoscopy with dilation of urethral stricture with Optilume  Surgeon: Jacalyn Lefevre, MD  Assistants:  None  Anesthesia:  General  Complications:  None  EBL: None  Specimens: 1.  None  Drains/Catheters: 1.  50 French council tip Foley  Intraoperative findings:   Normal anterior urethra Very narrow dense membranous/prostatic urethral stricture Normal bladder mucosa with moderate trabeculation Bilateral ureteral orifices  Indication:  DEANGLEO PASSAGE is a 71 y.o. male with history of BPH with LUTS who previously underwent a TURP prior to proceeding with external beam radiation therapy for prostate cancer who subsequently developed a urethral stricture.  Description of procedure: After risks and benefits of the procedure discussed with the patient in detail, informed consent was obtained.  The patient was taken to the operating room placed in the supine position.  Anesthesia induced antibiotics were administered.  The patient was then repositioned in the dorsolithotomy position.  He was prepped and draped in the usual sterile fashion and timeout was performed.  A 21 French rigid cystoscope was placed in the urethral meatus and advanced to the point of the stricture.  At this point in time the stricture was cannulated with a 0.38 sensor wire and advanced the bladder with fluoroscopic guidance.  The cystoscope was removed.  The Ultrex balloon dilator was then advanced over the wire to the balloon spanned the length of the stricture.  It was inflated and held in place for 2 minutes.  The balloon was then deflated taking care to keep the wire in place.  Diagnostic cystoscopy was unable to take place noting pale, prostatic urethra consistent with radiation.  The cystoscope was removed.  Next the Optilume balloon dilator was then  placed over the wire and positioned to span the stricture.  It was inflated to 10 atm and held in place for 5 minutes.  After 5 minutes it was deflated and removed.  A 16 French council tip Foley catheter was placed over the wire and into the bladder until it was hubbed.  The wire was removed and the Foley was inflated with 10 cc sterile water.  The patient emerged from anesthesia and was transferred the PACU in stable condition.   Plan: Patient be discharged home with Foley catheter in place

## 2021-09-21 NOTE — Anesthesia Procedure Notes (Signed)
Procedure Name: LMA Insertion Date/Time: 09/21/2021 11:35 AM  Performed by: Justice Rocher, CRNAPre-anesthesia Checklist: Patient identified, Emergency Drugs available, Suction available, Patient being monitored and Timeout performed Patient Re-evaluated:Patient Re-evaluated prior to induction Oxygen Delivery Method: Circle system utilized Preoxygenation: Pre-oxygenation with 100% oxygen Induction Type: IV induction Ventilation: Mask ventilation without difficulty LMA: LMA inserted LMA Size: 5.0 Number of attempts: 1 Airway Equipment and Method: Bite block Placement Confirmation: positive ETCO2, breath sounds checked- equal and bilateral and CO2 detector Tube secured with: Tape Dental Injury: Teeth and Oropharynx as per pre-operative assessment

## 2021-09-21 NOTE — Anesthesia Preprocedure Evaluation (Addendum)
Anesthesia Evaluation  Patient identified by MRN, date of birth, ID band Patient awake    Reviewed: Allergy & Precautions, NPO status , Patient's Chart, lab work & pertinent test results, reviewed documented beta blocker date and time   History of Anesthesia Complications (+) PONV and history of anesthetic complications  Airway Mallampati: IV  TM Distance: >3 FB Neck ROM: Full    Dental  (+) Teeth Intact, Dental Advisory Given   Pulmonary shortness of breath and with exertion, COPD,  COPD inhaler, former smoker,  Quit smoking 2002, 53 pack year history    Pulmonary exam normal breath sounds clear to auscultation       Cardiovascular hypertension (150/73 in preop, per pt normally 130s-140s SBP), Pt. on medications and Pt. on home beta blockers +CHF (grade 1 diastolic dysfunction)  Normal cardiovascular exam+ Valvular Problems/Murmurs (mild MR) MR  Rhythm:Regular Rate:Normal  Echo 06/2020 1. Left ventricular ejection fraction, by estimation, is >75%. The left  ventricle has hyperdynamic function. The left ventricle has no regional  wall motion abnormalities. There is mild left ventricular hypertrophy.  Left ventricular diastolic parameters  are consistent with Grade I diastolic dysfunction (impaired relaxation).  Elevated left atrial pressure.  2. Right ventricular systolic function is normal. The right ventricular  size is normal.  3. The mitral valve is normal in structure. Mild mitral valve  regurgitation. No evidence of mitral stenosis.  4. The aortic valve is tricuspid. Aortic valve regurgitation is not  visualized. Mild aortic valve sclerosis is present, with no evidence of  aortic valve stenosis.  5. The inferior vena cava is normal in size with greater than 50%  respiratory variability, suggesting right atrial pressure of 3 mmHg.    Neuro/Psych negative psych ROS   GI/Hepatic Neg liver ROS, hiatal hernia, GERD   Controlled and Medicated,  Endo/Other  BMI 36  Renal/GU Urethral stricture  negative genitourinary   Musculoskeletal  (+) Arthritis , Osteoarthritis,  Chronic pain, spinal cord stimulator Multiple back surgeries   Abdominal   Peds  Hematology negative hematology ROS (+)   Anesthesia Other Findings   Reproductive/Obstetrics negative OB ROS                           Anesthesia Physical Anesthesia Plan  ASA: 3  Anesthesia Plan: General   Post-op Pain Management: Tylenol PO (pre-op)*   Induction: Intravenous  PONV Risk Score and Plan: 3 and Ondansetron, Dexamethasone and Treatment may vary due to age or medical condition  Airway Management Planned: LMA  Additional Equipment: None  Intra-op Plan:   Post-operative Plan: Extubation in OR  Informed Consent: I have reviewed the patients History and Physical, chart, labs and discussed the procedure including the risks, benefits and alternatives for the proposed anesthesia with the patient or authorized representative who has indicated his/her understanding and acceptance.     Dental advisory given  Plan Discussed with: CRNA  Anesthesia Plan Comments: (TURP 09/2020:  Ventilation: Mask ventilation without difficulty LMA: LMA with gastric port inserted LMA Size: 4.0 Number of attempts: 1)      Anesthesia Quick Evaluation

## 2021-09-22 ENCOUNTER — Encounter (HOSPITAL_COMMUNITY): Payer: Self-pay | Admitting: Urology

## 2021-09-25 DIAGNOSIS — N99114 Postprocedural urethral stricture, male, unspecified: Secondary | ICD-10-CM | POA: Diagnosis not present

## 2021-09-27 DIAGNOSIS — G8929 Other chronic pain: Secondary | ICD-10-CM | POA: Diagnosis not present

## 2021-09-27 DIAGNOSIS — R2689 Other abnormalities of gait and mobility: Secondary | ICD-10-CM | POA: Diagnosis not present

## 2021-09-27 DIAGNOSIS — Z23 Encounter for immunization: Secondary | ICD-10-CM | POA: Diagnosis not present

## 2021-10-02 DIAGNOSIS — N99114 Postprocedural urethral stricture, male, unspecified: Secondary | ICD-10-CM | POA: Diagnosis not present

## 2021-10-09 DIAGNOSIS — H401331 Pigmentary glaucoma, bilateral, mild stage: Secondary | ICD-10-CM | POA: Diagnosis not present

## 2021-10-09 DIAGNOSIS — H353221 Exudative age-related macular degeneration, left eye, with active choroidal neovascularization: Secondary | ICD-10-CM | POA: Diagnosis not present

## 2021-10-09 DIAGNOSIS — H353112 Nonexudative age-related macular degeneration, right eye, intermediate dry stage: Secondary | ICD-10-CM | POA: Diagnosis not present

## 2021-10-09 DIAGNOSIS — H02834 Dermatochalasis of left upper eyelid: Secondary | ICD-10-CM | POA: Diagnosis not present

## 2021-10-09 DIAGNOSIS — H02831 Dermatochalasis of right upper eyelid: Secondary | ICD-10-CM | POA: Diagnosis not present

## 2021-10-16 DIAGNOSIS — H353231 Exudative age-related macular degeneration, bilateral, with active choroidal neovascularization: Secondary | ICD-10-CM | POA: Diagnosis not present

## 2021-10-20 NOTE — Progress Notes (Unsigned)
Cardiology Office Note   Date:  10/20/2021   ID:  Cody Tate, DOB 09-25-1950, MRN [id number]  PCP:  Cody Manes, MD  Cardiologist:   Cody Rouge, MD   No chief complaint on file.     History of Present Illness:  71 y.o. with history of HTN, former smoker with COPD. TTE 01/30/19 with normal EF mean AV gradient 8 peak 16 mmHg previous small LVOT gradient noted Has had weight gain, dyspnea and some edema RX diuretics BNP normal 01/29/20  No history of CAD with normal myovue 01/10/18 EF 81%  Had uncomplicated nerve stimulator implant with Cody Cody Tate for failed L1-5 fusion on 01/16/18 Hard to elevate legs due to back pain   Hard to urinate with prostatism and flomax not helpful He tells me his PSA has been good had biopsy with Cody Cody Tate Cody Tate Cystoscopy with dilatation of urethral stricture done 09/21/21   Discussed taking lasix latter in day to help with edema. 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 no need for intervention   BP up in office repeat by me 145/80 mmHg Discussed salt intake and diet He will monitor at home  Past Medical History:  Diagnosis Date   Arthritis    BPH (benign prostatic hyperplasia)    Cancer (HCC)    prostate cancer   COPD (chronic obstructive pulmonary disease) (HCC)    Dyspnea    Enlarged prostate    GERD (gastroesophageal reflux disease)    Glaucoma    H/O hiatal hernia    H/O wheezing    occ inhaler usage   Heart murmur    Hypertension    Macular degeneration    Meningioma (HCC)    PONV (postoperative nausea and vomiting)    Pulmonary nodules    resolved on 2019 follow up CT   S/P insertion of spinal cord stimulator     Past Surgical History:  Procedure Laterality Date   BACK SURGERY  06,07,08   lam x3 cerv x 1   CERVICAL FUSION     CYSTOSCOPY     71  yrs old   CYSTOSCOPY WITH URETHRAL DILATATION N/A 09/21/2021   Procedure: CYSTOSCOPY WITH BALLOON DILATION and OPTILUME BALLOON DILATION OF  URETHRAL DILATATION;  Surgeon: Cody Fries, MD;  Location: [facility];  Service: Tate;  Laterality: N/A;   EYE SURGERY     bil   GOLD SEED IMPLANT N/A 09/06/2020   Procedure: GOLD SEED IMPLANT/ PLACEMENT OF FIDUCIAL MARKERS;  Surgeon: Cody Fries, MD;  Location: [facility];  Service: Tate;  Laterality: N/A;   KNEE ARTHROSCOPY  10   lft   KNEE ARTHROSCOPY Left 07/23/2012   Procedure: LEFT MEDIAL MENISCAL DEBRIDEMENT AND CHONDROPLASTY;  Surgeon: Cody Alf, MD;  Location: [facility];  Service: Orthopedics;  Laterality: Left;   LUMBAR FUSION  02/22/2014   Cody Tate   PILONIDAL CYST EXCISION     SINUS EXPLORATION     SPACE OAR INSTILLATION N/A 09/06/2020   Procedure: SPACE OAR INSTILLATION;  Surgeon: Cody Fries, MD;  Location: [facility];  Service: Tate;  Laterality: N/A;   SPINAL CORD STIMULATOR INSERTION N/A 01/16/2018   Procedure: LUMBAR SPINAL CORD STIMULATOR INSERTION;  Surgeon: Cody Moore, MD;  Location: [facility];  Service: Neurosurgery;  Laterality: N/A;  LUMBAR  SPINAL CORD STIMULATOR INSERTION   TRANSURETHRAL RESECTION OF PROSTATE N/A 09/06/2020   Procedure: TRANSURETHRAL RESECTION OF THE PROSTATE (TURP);  Surgeon: Cody Fries, MD;  Location: [facility];  Service: Tate;  Laterality: N/A;  2 HRS   WRIST GANGLION EXCISION     rt   WRIST SURGERY  09   cyst lft     Current Outpatient Medications  Medication Sig Dispense Refill   Ascorbic Acid (VITAMIN C) 1000 MG tablet Take 1,000 mg by mouth daily.     chlorhexidine (HIBICLENS) 4 % external liquid Apply 1 Application topically See admin instructions. Apply daily for the first week of every month     Cholecalciferol (VITAMIN D3) 50 MCG (2000 UT) capsule Take 4,000 Units by mouth daily.     diphenhydrAMINE (BENADRYL) 25 mg capsule Take 25 mg by mouth at bedtime as needed for sleep.      finasteride (PROSCAR) 5 MG tablet Take 5 mg by mouth daily.     fluticasone (FLONASE) 50 MCG/ACT nasal spray Place 2 sprays into both nostrils daily.      furosemide (LASIX) 20 MG tablet Take 1 tablet by mouth once daily 90 tablet 1   gabapentin (NEURONTIN) 300 MG capsule Take 300 mg by mouth 4 (four) times daily.     Guaifenesin (MUCINEX MAXIMUM STRENGTH) 1200 MG TB12 Take 1,200 mg by mouth 2 (two) times daily.     ibuprofen (ADVIL,MOTRIN) 200 MG tablet Take 400 mg by mouth 4 (four) times daily as needed for moderate pain.     Ipratropium-Albuterol (COMBIVENT RESPIMAT) 20-100 MCG/ACT AERS respimat Inhale 1 puff into the lungs every 6 (six) hours as needed for wheezing or shortness of breath. 12 g 3   latanoprost (XALATAN) 0.005 % ophthalmic solution Place 1 drop into both eyes at bedtime.      magnesium hydroxide (MILK OF MAGNESIA) 400 MG/5ML suspension Take 15 mLs by mouth at bedtime.     metoprolol succinate (TOPROL-XL) 100 MG 24 hr tablet Take 100 mg by mouth at bedtime. Take with or immediately following a meal.     montelukast (SINGULAIR) 10 MG tablet TAKE 1 TABLET BY MOUTH AT  BEDTIME 90 tablet 3   Multiple Vitamin (MULTIVITAMIN WITH MINERALS) TABS tablet Take 1 tablet by mouth daily.     Multiple Vitamins-Minerals (PRESERVISION AREDS 2+MULTI VIT) CAPS Take 1 capsule by mouth in the morning and at bedtime.     mupirocin ointment (BACTROBAN) 2 % Apply 1 Application topically See admin instructions. Apply in nostrils at night 2 times weekly, may also use as needed for wound care     omeprazole (PRILOSEC) 40 MG capsule Take 1 capsule (40 mg total) by mouth daily before breakfast. 90 capsule 3   Oxycodone HCl 10 MG TABS Take 10 mg by mouth every 4 (four) hours as needed (pain).     oxymetazoline (AFRIN) 0.05 % nasal spray Place 2 sprays into both nostrils 2 (two) times daily as needed for congestion.     Propylene Glycol, PF, (SYSTANE COMPLETE PF) 0.6 % SOLN Place 1 drop into both eyes 3  (three) times daily as needed (dry eyes).     pseudoephedrine (SUDAFED) 30 MG tablet Take 60 mg by mouth every 6 (six) hours as needed for congestion.     ramipril (ALTACE) 10 MG capsule Take 10 mg by mouth every morning.      Simethicone 125 MG CAPS Take 250 mg by mouth 4 (four) times daily as needed (for gas).  simvastatin (ZOCOR) 20 MG tablet Take 20 mg by mouth at bedtime.      sodium chloride (OCEAN) 0.65 % SOLN nasal spray Place 1 spray into both nostrils as needed for congestion. As needed     spironolactone (ALDACTONE) 25 MG tablet Take 12.5 mg by mouth at bedtime.      Tamsulosin HCl (FLOMAX) 0.4 MG CAPS Take 0.4 mg by mouth 2 (two) times daily.     TRELEGY ELLIPTA 200-62.5-25 MCG/ACT AEPB USE 1 INHALATION BY MOUTH  DAILY 180 each 3   No current facility-administered medications for this visit.    Allergies:   Adhesive [tape] and Other    Social History:  The patient  reports that he quit smoking about 21 years ago. His smoking use included cigarettes. He has a 52.50 pack-year smoking history. He has never used smokeless tobacco. He reports that he does not currently use alcohol. He reports that he does not use drugs.   Family History:  The patient's family history includes COPD in his maternal uncle.    ROS:  Please see the history of present illness.   Otherwise, review of systems are positive for none.   All other systems are reviewed and negative.    PHYSICAL EXAM: VS:  There were no vitals taken for this visit. , BMI There is no height or weight on file to calculate BMI. Affect appropriate Healthy:  appears stated age 64: normal Neck supple with no adenopathy JVP normal no bruits no thyromegaly Lungs diffuse rhonchi and  wheezing and good diaphragmatic motion Heart:  S1/S2 SEM murmur, no rub, gallop or click PMI normal Abdomen: benighn, BS positve, no tenderness, no AAA no bruit.  No HSM or HJR Distal pulses intact with no bruits Plus 2 bilateral edema Neuro  non-focal Skin warm and dry Previous lumbar fusion with nerve stimulator in place     EKG:  10/24/2021 NSR rate 88 normal    Recent Labs: 09/18/2021: BUN 22; Creatinine, Ser 0.99; Hemoglobin 13.7; Platelets 263; Potassium 4.1; Sodium 139    Lipid Panel No results found for: "CHOL", "TRIG", "HDL", "CHOLHDL", "VLDL", "LDLCALC", "LDLDIRECT"    Wt Readings from Last 3 Encounters:  09/21/21 225 lb 12 oz (102.4 kg)  09/18/21 225 lb 12 oz (102.4 kg)  04/25/21 226 lb 3.2 oz (102.6 kg)      Other studies Reviewed: Myovue 01/10/18 Echo 01/10/18  .    ASSESSMENT AND PLAN:  1. Abnormal ECG:  Lateral T wave changes ? From HTN non ischemic myovue 2020  2. COPD: no active wheezing former smoker f/u Cody Cody Tate Inhalers  3. HTN:  f/u [place] Weight loss/ DASH diet consider changing Altace to Diovan in future 4. HLD  Continue statin labs with Cody Felipa Eth  5. AS:  Mild  mean gradient 8 peak 13mhg TTE done 01/30/19  Last TTE 06/27/20 no gradient Recorded just AV sclerosis Observe  6. Back Pain: post nerve stimulator placement Cody JRonnald Ramp1/16/20 previous L1-5 fusion  7. Edema:  Dependant from obesity continue diuretics BNP normal  8. Prostate:  PSA normal continue flomax and proscar post dilatation of urethral stricture f/u Cody PClaudia Desanctis9. Meningioma:  asymptomatic f/u imaging per neuro    Current medicines are reviewed at length with the patient today.  The patient does not have concerns regarding medicines.  The following changes have been made:   None   Labs/ tests ordered today include:   None    No orders of the defined types  were placed in this encounter.    Disposition:   FU with cardiology in a year      Signed, Cody Rouge, MD  10/20/2021 10:07 AM    [address], [place],   [address] Phone: [phone]; Fax: [phone]

## 2021-10-24 ENCOUNTER — Ambulatory Visit: Payer: Medicare Other | Attending: Cardiovascular Disease | Admitting: Cardiovascular Disease

## 2021-10-24 ENCOUNTER — Encounter: Payer: Self-pay | Admitting: Cardiovascular Disease

## 2021-10-24 VITALS — BP 156/70 | HR 60 | Ht 66.5 in | Wt 221.6 lb

## 2021-10-24 DIAGNOSIS — I2510 Atherosclerotic heart disease of native coronary artery without angina pectoris: Secondary | ICD-10-CM

## 2021-10-24 DIAGNOSIS — E782 Mixed hyperlipidemia: Secondary | ICD-10-CM | POA: Diagnosis not present

## 2021-10-24 DIAGNOSIS — N429 Disorder of prostate, unspecified: Secondary | ICD-10-CM | POA: Diagnosis not present

## 2021-10-24 DIAGNOSIS — I10 Essential (primary) hypertension: Secondary | ICD-10-CM | POA: Diagnosis not present

## 2021-10-24 DIAGNOSIS — I350 Nonrheumatic aortic (valve) stenosis: Secondary | ICD-10-CM | POA: Insufficient documentation

## 2021-10-24 NOTE — Patient Instructions (Signed)
Medication Instructions:  Your physician recommends that you continue on your current medications as directed. Please refer to the Current Medication list given to you today.  *If you need a refill on your cardiac medications before your next appointment, please call your pharmacy*  Lab Work: If you have labs (blood work) drawn today and your tests are completely normal, you will receive your results only by: MyChart Message (if you have MyChart) OR A paper copy in the mail If you have any lab test that is abnormal or we need to change your treatment, we will call you to review the results.  Testing/Procedures: None ordered today.  Follow-Up: At [facility], you and your health needs are our priority.  As part of our continuing mission to provide you with exceptional heart care, we have created designated Provider Care Teams.  These Care Teams include your primary Cardiologist (physician) and Advanced Practice Providers (APPs -  Physician Assistants and Nurse Practitioners) who all work together to provide you with the care you need, when you need it.  We recommend signing up for the patient portal called "MyChart".  Sign up information is provided on this After Visit Summary.  MyChart is used to connect with patients for Virtual Visits (Telemedicine).  Patients are able to view lab/test results, encounter notes, upcoming appointments, etc.  Non-urgent messages can be sent to your provider as well.   To learn more about what you can do with MyChart, go to [email or web address].    Your next appointment:   1 year(s)  The format for your next appointment:   In Person  Provider:   Peter Nishan, MD     Important Information About Sugar       

## 2021-10-25 DIAGNOSIS — R2681 Unsteadiness on feet: Secondary | ICD-10-CM | POA: Diagnosis not present

## 2021-10-25 DIAGNOSIS — M48061 Spinal stenosis, lumbar region without neurogenic claudication: Secondary | ICD-10-CM | POA: Diagnosis not present

## 2021-10-25 DIAGNOSIS — Z9181 History of falling: Secondary | ICD-10-CM | POA: Diagnosis not present

## 2021-10-25 DIAGNOSIS — M6281 Muscle weakness (generalized): Secondary | ICD-10-CM | POA: Diagnosis not present

## 2021-11-01 DIAGNOSIS — M48061 Spinal stenosis, lumbar region without neurogenic claudication: Secondary | ICD-10-CM | POA: Diagnosis not present

## 2021-11-01 DIAGNOSIS — Z9181 History of falling: Secondary | ICD-10-CM | POA: Diagnosis not present

## 2021-11-01 DIAGNOSIS — R2681 Unsteadiness on feet: Secondary | ICD-10-CM | POA: Diagnosis not present

## 2021-11-01 DIAGNOSIS — M6281 Muscle weakness (generalized): Secondary | ICD-10-CM | POA: Diagnosis not present

## 2021-11-07 ENCOUNTER — Other Ambulatory Visit: Payer: Self-pay | Admitting: *Deleted

## 2021-11-07 DIAGNOSIS — D329 Benign neoplasm of meninges, unspecified: Secondary | ICD-10-CM

## 2021-11-07 DIAGNOSIS — M6281 Muscle weakness (generalized): Secondary | ICD-10-CM | POA: Diagnosis not present

## 2021-11-07 DIAGNOSIS — Z9181 History of falling: Secondary | ICD-10-CM | POA: Diagnosis not present

## 2021-11-07 DIAGNOSIS — M48061 Spinal stenosis, lumbar region without neurogenic claudication: Secondary | ICD-10-CM | POA: Diagnosis not present

## 2021-11-07 DIAGNOSIS — R2681 Unsteadiness on feet: Secondary | ICD-10-CM | POA: Diagnosis not present

## 2021-11-08 ENCOUNTER — Other Ambulatory Visit: Payer: Self-pay | Admitting: Neurosurgery

## 2021-11-08 DIAGNOSIS — M48062 Spinal stenosis, lumbar region with neurogenic claudication: Secondary | ICD-10-CM | POA: Diagnosis not present

## 2021-11-15 DIAGNOSIS — M6281 Muscle weakness (generalized): Secondary | ICD-10-CM | POA: Diagnosis not present

## 2021-11-15 DIAGNOSIS — M48061 Spinal stenosis, lumbar region without neurogenic claudication: Secondary | ICD-10-CM | POA: Diagnosis not present

## 2021-11-15 DIAGNOSIS — Z9181 History of falling: Secondary | ICD-10-CM | POA: Diagnosis not present

## 2021-11-15 DIAGNOSIS — R2681 Unsteadiness on feet: Secondary | ICD-10-CM | POA: Diagnosis not present

## 2021-11-21 DIAGNOSIS — M48061 Spinal stenosis, lumbar region without neurogenic claudication: Secondary | ICD-10-CM | POA: Diagnosis not present

## 2021-11-21 DIAGNOSIS — Z9181 History of falling: Secondary | ICD-10-CM | POA: Diagnosis not present

## 2021-11-21 DIAGNOSIS — R2681 Unsteadiness on feet: Secondary | ICD-10-CM | POA: Diagnosis not present

## 2021-11-21 DIAGNOSIS — Z23 Encounter for immunization: Secondary | ICD-10-CM | POA: Diagnosis not present

## 2021-11-21 DIAGNOSIS — M6281 Muscle weakness (generalized): Secondary | ICD-10-CM | POA: Diagnosis not present

## 2021-11-29 DIAGNOSIS — M6281 Muscle weakness (generalized): Secondary | ICD-10-CM | POA: Diagnosis not present

## 2021-11-29 DIAGNOSIS — R2681 Unsteadiness on feet: Secondary | ICD-10-CM | POA: Diagnosis not present

## 2021-11-29 DIAGNOSIS — Z9181 History of falling: Secondary | ICD-10-CM | POA: Diagnosis not present

## 2021-11-29 DIAGNOSIS — M48061 Spinal stenosis, lumbar region without neurogenic claudication: Secondary | ICD-10-CM | POA: Diagnosis not present

## 2021-12-02 ENCOUNTER — Other Ambulatory Visit: Payer: Self-pay | Admitting: Pulmonary Disease

## 2021-12-04 NOTE — Telephone Encounter (Signed)
Any future refills requires an appointment

## 2021-12-06 DIAGNOSIS — R2681 Unsteadiness on feet: Secondary | ICD-10-CM | POA: Diagnosis not present

## 2021-12-06 DIAGNOSIS — Z9181 History of falling: Secondary | ICD-10-CM | POA: Diagnosis not present

## 2021-12-06 DIAGNOSIS — M6281 Muscle weakness (generalized): Secondary | ICD-10-CM | POA: Diagnosis not present

## 2021-12-06 DIAGNOSIS — M48061 Spinal stenosis, lumbar region without neurogenic claudication: Secondary | ICD-10-CM | POA: Diagnosis not present

## 2021-12-07 DIAGNOSIS — J449 Chronic obstructive pulmonary disease, unspecified: Secondary | ICD-10-CM | POA: Diagnosis not present

## 2021-12-07 DIAGNOSIS — I10 Essential (primary) hypertension: Secondary | ICD-10-CM | POA: Diagnosis not present

## 2021-12-07 DIAGNOSIS — K219 Gastro-esophageal reflux disease without esophagitis: Secondary | ICD-10-CM | POA: Diagnosis not present

## 2021-12-07 DIAGNOSIS — G8929 Other chronic pain: Secondary | ICD-10-CM | POA: Diagnosis not present

## 2021-12-07 DIAGNOSIS — E7800 Pure hypercholesterolemia, unspecified: Secondary | ICD-10-CM | POA: Diagnosis not present

## 2021-12-07 DIAGNOSIS — H409 Unspecified glaucoma: Secondary | ICD-10-CM | POA: Diagnosis not present

## 2021-12-11 DIAGNOSIS — H353231 Exudative age-related macular degeneration, bilateral, with active choroidal neovascularization: Secondary | ICD-10-CM | POA: Diagnosis not present

## 2021-12-11 DIAGNOSIS — H43813 Vitreous degeneration, bilateral: Secondary | ICD-10-CM | POA: Diagnosis not present

## 2021-12-11 DIAGNOSIS — H401132 Primary open-angle glaucoma, bilateral, moderate stage: Secondary | ICD-10-CM | POA: Diagnosis not present

## 2021-12-13 DIAGNOSIS — M6281 Muscle weakness (generalized): Secondary | ICD-10-CM | POA: Diagnosis not present

## 2021-12-13 DIAGNOSIS — M48061 Spinal stenosis, lumbar region without neurogenic claudication: Secondary | ICD-10-CM | POA: Diagnosis not present

## 2021-12-13 DIAGNOSIS — R2681 Unsteadiness on feet: Secondary | ICD-10-CM | POA: Diagnosis not present

## 2021-12-13 DIAGNOSIS — Z9181 History of falling: Secondary | ICD-10-CM | POA: Diagnosis not present

## 2021-12-15 ENCOUNTER — Ambulatory Visit
Admission: RE | Admit: 2021-12-15 | Discharge: 2021-12-15 | Disposition: A | Discharge status: Home / Self Care | Payer: Medicare Other | Source: Ambulatory Visit | Attending: Neurosurgery | Admitting: Neurosurgery

## 2021-12-15 DIAGNOSIS — R200 Anesthesia of skin: Secondary | ICD-10-CM | POA: Diagnosis not present

## 2021-12-15 DIAGNOSIS — M48062 Spinal stenosis, lumbar region with neurogenic claudication: Secondary | ICD-10-CM

## 2021-12-15 DIAGNOSIS — M545 Low back pain, unspecified: Secondary | ICD-10-CM | POA: Diagnosis not present

## 2021-12-15 DIAGNOSIS — M48061 Spinal stenosis, lumbar region without neurogenic claudication: Secondary | ICD-10-CM | POA: Diagnosis not present

## 2021-12-19 DIAGNOSIS — Z6835 Body mass index (BMI) 35.0-35.9, adult: Secondary | ICD-10-CM | POA: Diagnosis not present

## 2021-12-19 DIAGNOSIS — M48061 Spinal stenosis, lumbar region without neurogenic claudication: Secondary | ICD-10-CM | POA: Diagnosis not present

## 2021-12-20 DIAGNOSIS — M48061 Spinal stenosis, lumbar region without neurogenic claudication: Secondary | ICD-10-CM | POA: Diagnosis not present

## 2021-12-20 DIAGNOSIS — M6281 Muscle weakness (generalized): Secondary | ICD-10-CM | POA: Diagnosis not present

## 2021-12-20 DIAGNOSIS — R2681 Unsteadiness on feet: Secondary | ICD-10-CM | POA: Diagnosis not present

## 2021-12-20 DIAGNOSIS — Z9181 History of falling: Secondary | ICD-10-CM | POA: Diagnosis not present

## 2022-01-11 ENCOUNTER — Other Ambulatory Visit: Payer: Self-pay

## 2022-01-11 DIAGNOSIS — I77819 Aortic ectasia, unspecified site: Secondary | ICD-10-CM | POA: Insufficient documentation

## 2022-01-11 DIAGNOSIS — E782 Mixed hyperlipidemia: Secondary | ICD-10-CM | POA: Insufficient documentation

## 2022-01-11 DIAGNOSIS — E66812 Obesity, class 2: Secondary | ICD-10-CM | POA: Insufficient documentation

## 2022-01-11 DIAGNOSIS — K219 Gastro-esophageal reflux disease without esophagitis: Secondary | ICD-10-CM | POA: Insufficient documentation

## 2022-01-11 DIAGNOSIS — E7800 Pure hypercholesterolemia, unspecified: Secondary | ICD-10-CM | POA: Insufficient documentation

## 2022-01-11 DIAGNOSIS — R911 Solitary pulmonary nodule: Secondary | ICD-10-CM | POA: Insufficient documentation

## 2022-01-11 DIAGNOSIS — E785 Hyperlipidemia, unspecified: Secondary | ICD-10-CM | POA: Insufficient documentation

## 2022-01-11 DIAGNOSIS — M5442 Lumbago with sciatica, left side: Secondary | ICD-10-CM | POA: Insufficient documentation

## 2022-01-11 DIAGNOSIS — G8929 Other chronic pain: Secondary | ICD-10-CM | POA: Insufficient documentation

## 2022-01-11 DIAGNOSIS — J449 Chronic obstructive pulmonary disease, unspecified: Secondary | ICD-10-CM | POA: Insufficient documentation

## 2022-01-11 DIAGNOSIS — I10 Essential (primary) hypertension: Secondary | ICD-10-CM | POA: Insufficient documentation

## 2022-01-11 DIAGNOSIS — M48061 Spinal stenosis, lumbar region without neurogenic claudication: Secondary | ICD-10-CM | POA: Insufficient documentation

## 2022-01-11 DIAGNOSIS — M5441 Lumbago with sciatica, right side: Secondary | ICD-10-CM | POA: Insufficient documentation

## 2022-01-11 DIAGNOSIS — E669 Obesity, unspecified: Secondary | ICD-10-CM | POA: Insufficient documentation

## 2022-01-24 DIAGNOSIS — G8929 Other chronic pain: Secondary | ICD-10-CM | POA: Diagnosis not present

## 2022-01-24 DIAGNOSIS — E7800 Pure hypercholesterolemia, unspecified: Secondary | ICD-10-CM | POA: Diagnosis not present

## 2022-01-24 DIAGNOSIS — I10 Essential (primary) hypertension: Secondary | ICD-10-CM | POA: Diagnosis not present

## 2022-01-24 DIAGNOSIS — J449 Chronic obstructive pulmonary disease, unspecified: Secondary | ICD-10-CM | POA: Diagnosis not present

## 2022-02-02 ENCOUNTER — Other Ambulatory Visit: Payer: Self-pay | Admitting: Pulmonary Disease

## 2022-02-12 DIAGNOSIS — C61 Malignant neoplasm of prostate: Secondary | ICD-10-CM | POA: Diagnosis not present

## 2022-02-21 ENCOUNTER — Other Ambulatory Visit: Payer: Self-pay | Admitting: Neurosurgery

## 2022-02-21 ENCOUNTER — Telehealth: Payer: Self-pay | Admitting: *Deleted

## 2022-02-21 DIAGNOSIS — C61 Malignant neoplasm of prostate: Secondary | ICD-10-CM | POA: Diagnosis not present

## 2022-02-21 DIAGNOSIS — N99114 Postprocedural urethral stricture, male, unspecified: Secondary | ICD-10-CM | POA: Diagnosis not present

## 2022-02-21 NOTE — Telephone Encounter (Signed)
  Patient Consent for Virtual Visit         Cody Tate has provided verbal consent on 02/21/2022 for a virtual visit (video or telephone).   CONSENT FOR VIRTUAL VISIT FOR:  Cody Tate  By participating in this virtual visit I agree to the following:  I hereby voluntarily request, consent and authorize [facility] and its employed or contracted physicians, physician assistants, nurse practitioners or other licensed health care professionals (the Practitioner), to provide me with telemedicine health care services (the "Services") as deemed necessary by the treating Practitioner. I acknowledge and consent to receive the Services by the Practitioner via telemedicine. I understand that the telemedicine visit will involve communicating with the Practitioner through live audiovisual communication technology and the disclosure of certain medical information by electronic transmission. I acknowledge that I have been given the opportunity to request an in-person assessment or other available alternative prior to the telemedicine visit and am voluntarily participating in the telemedicine visit.  I understand that I have the right to withhold or withdraw my consent to the use of telemedicine in the course of my care at any time, without affecting my right to future care or treatment, and that the Practitioner or I may terminate the telemedicine visit at any time. I understand that I have the right to inspect all information obtained and/or recorded in the course of the telemedicine visit and may receive copies of available information for a reasonable fee.  I understand that some of the potential risks of receiving the Services via telemedicine include:  Delay or interruption in medical evaluation due to technological equipment failure or disruption; Information transmitted may not be sufficient (e.g. poor resolution of images) to allow for appropriate medical decision making by the  Practitioner; and/or  In rare instances, security protocols could fail, causing a breach of personal health information.  Furthermore, I acknowledge that it is my responsibility to provide information about my medical history, conditions and care that is complete and accurate to the best of my ability. I acknowledge that Practitioner's advice, recommendations, and/or decision may be based on factors not within their control, such as incomplete or inaccurate data provided by me or distortions of diagnostic images or specimens that may result from electronic transmissions. I understand that the practice of medicine is not an exact science and that Practitioner makes no warranties or guarantees regarding treatment outcomes. I acknowledge that a copy of this consent can be made available to me via my patient portal ([facility]), or I can request a printed copy by calling the office of [facility].    I understand that my insurance will be billed for this visit.   I have read or had this consent read to me. I understand the contents of this consent, which adequately explains the benefits and risks of the Services being provided via telemedicine.  I have been provided ample opportunity to ask questions regarding this consent and the Services and have had my questions answered to my satisfaction. I give my informed consent for the services to be provided through the use of telemedicine in my medical care

## 2022-02-21 NOTE — Telephone Encounter (Signed)
Appt made for next week

## 2022-02-21 NOTE — Telephone Encounter (Signed)
   Pre-operative Risk Assessment    [name]  DOB: 1950/06/13 MRN: [id number]      Request for Surgical Clearance    Procedure:   L5-S1 ANTERIOR LUMBAR FUSION  Date of Surgery:  Clearance 03/16/22                                 Surgeon:  Charlie Pitter, MD Surgeon's Group or Practice Name:  [facility] Phone number:  [phone] Fax number:  [phone]   Type of Clearance Requested:   - Medical    Type of Anesthesia:  General    Additional requests/questions:    Astrid Divine   02/21/2022, 2:44 PM

## 2022-02-21 NOTE — Telephone Encounter (Signed)
   Name: Cody Tate  DOB: 06/10/1950  MRN: [id number]  Primary Cardiologist: [name], MD   Preoperative team, please contact this patient and set up a phone call appointment for further preoperative risk assessment. Please obtain consent and complete medication review. Thank you for your help.   Mayra Reel, NP 02/21/2022, 2:50 PM [place]

## 2022-02-23 DIAGNOSIS — J449 Chronic obstructive pulmonary disease, unspecified: Secondary | ICD-10-CM | POA: Diagnosis not present

## 2022-02-23 DIAGNOSIS — I10 Essential (primary) hypertension: Secondary | ICD-10-CM | POA: Diagnosis not present

## 2022-02-23 DIAGNOSIS — G8929 Other chronic pain: Secondary | ICD-10-CM | POA: Diagnosis not present

## 2022-02-23 DIAGNOSIS — E7800 Pure hypercholesterolemia, unspecified: Secondary | ICD-10-CM | POA: Diagnosis not present

## 2022-02-23 DIAGNOSIS — K219 Gastro-esophageal reflux disease without esophagitis: Secondary | ICD-10-CM | POA: Diagnosis not present

## 2022-02-26 DIAGNOSIS — H353231 Exudative age-related macular degeneration, bilateral, with active choroidal neovascularization: Secondary | ICD-10-CM | POA: Diagnosis not present

## 2022-02-26 DIAGNOSIS — H43813 Vitreous degeneration, bilateral: Secondary | ICD-10-CM | POA: Diagnosis not present

## 2022-02-26 DIAGNOSIS — H401132 Primary open-angle glaucoma, bilateral, moderate stage: Secondary | ICD-10-CM | POA: Diagnosis not present

## 2022-02-27 ENCOUNTER — Other Ambulatory Visit: Payer: Self-pay

## 2022-02-28 ENCOUNTER — Telehealth: Payer: Self-pay | Admitting: *Deleted

## 2022-02-28 ENCOUNTER — Ambulatory Visit: Payer: Medicare Other | Attending: Interventional Cardiology | Admitting: Physician Assistant

## 2022-02-28 DIAGNOSIS — Z01810 Encounter for preprocedural cardiovascular examination: Secondary | ICD-10-CM

## 2022-02-28 NOTE — Progress Notes (Addendum)
Virtual Visit via Telephone Note   Because of Cody Tate co-morbid illnesses, he is at least at moderate risk for complications without adequate follow up.  This format is felt to be most appropriate for this patient at this time.  The patient did not have access to video technology/had technical difficulties with video requiring transitioning to audio format only (telephone).  All issues noted in this document were discussed and addressed.  No physical exam could be performed with this format.  Please refer to the patient's chart for his consent to telehealth for [facility].  Evaluation Performed:  Preoperative cardiovascular risk assessment _____________   Date:  02/28/2022   Patient ID:  Cody Tate, DOB Dec 20, 1950, MRN [id number] Patient Location:  Home Provider location:   Office  Primary Care Provider:  [name], MD Primary Cardiologist:  [name], MD  Chief Complaint / Patient Profile   72 y.o. y/o male with a h/o HTN, HLD, is a former smoker with COPD, aortic valve sclerosis, and meningioma,  who is pending L5-S1 ANTERIOR LUMBAR FUSION and presents today for telephonic preoperative cardiovascular risk assessment.  History of Present Illness    Cody Tate is a 72 y.o. male who presents via audio/video conferencing for a telehealth visit today.  Pt was last seen in cardiology clinic on 10/25/22 by Dr. Johnsie Cancel.  At that time Cody Tate was doing well.  The patient is now pending procedure as outlined above. Since his last visit, he is limited by his back pain. He is unable to complete 4.0 METS and uses a scooter when he needs to ambulate.  Past Medical History    Past Medical History:  Diagnosis Date   Arthritis    Back pain    Radiates down both legs   BPH (benign prostatic hyperplasia)    Cancer (HCC)    prostate cancer   COPD (chronic obstructive pulmonary disease) (HCC)    Dyspnea    Enlarged prostate    GERD  (gastroesophageal reflux disease)    Glaucoma    H/O hiatal hernia    H/O wheezing    occ inhaler usage   Heart murmur    Hypertension    Lumbar foraminal stenosis    Macular degeneration    Meningioma (HCC)    PONV (postoperative nausea and vomiting)    Pulmonary nodules    resolved on 2019 follow up CT   S/P insertion of spinal cord stimulator    Past Surgical History:  Procedure Laterality Date   BACK SURGERY  06,07,08   lam x3 cerv x 1   CERVICAL FUSION     CYSTOSCOPY     72 yrs old   CYSTOSCOPY WITH URETHRAL DILATATION N/A 09/21/2021   Procedure: CYSTOSCOPY WITH BALLOON DILATION and OPTILUME BALLOON DILATION OF  URETHRAL DILATATION;  Surgeon: Robley Fries, MD;  Location: [facility];  Service: Urology;  Laterality: N/A;   EYE SURGERY     bil   GOLD SEED IMPLANT N/A 09/06/2020   Procedure: GOLD SEED IMPLANT/ PLACEMENT OF FIDUCIAL MARKERS;  Surgeon: Robley Fries, MD;  Location: [facility];  Service: Urology;  Laterality: N/A;   KNEE ARTHROSCOPY  10   lft   KNEE ARTHROSCOPY Left 07/23/2012   Procedure: LEFT MEDIAL MENISCAL DEBRIDEMENT AND CHONDROPLASTY;  Surgeon: [name], MD;  Location: [facility];  Service: Orthopedics;  Laterality: Left;   LUMBAR FUSION  02/22/2014   DR POOL   PILONIDAL CYST EXCISION  SINUS EXPLORATION     SPACE OAR INSTILLATION N/A 09/06/2020   Procedure: SPACE OAR INSTILLATION;  Surgeon: Robley Fries, MD;  [facility];  Service: Urology;  Laterality: N/A;   SPINAL CORD STIMULATOR INSERTION N/A 01/16/2018   Procedure: LUMBAR SPINAL CORD STIMULATOR INSERTION;  Surgeon: [name], MD;  Location: [place];  Service: Neurosurgery;  Laterality: N/A;  LUMBAR SPINAL CORD STIMULATOR INSERTION   TRANSURETHRAL RESECTION OF PROSTATE N/A 09/06/2020   Procedure: TRANSURETHRAL RESECTION OF THE PROSTATE (TURP);  Surgeon: Robley Fries, MD;  [facility];  Service: Urology;  Laterality: N/A;  2 HRS   WRIST GANGLION EXCISION     rt   WRIST SURGERY   09   cyst lft    Allergies  Allergies  Allergen Reactions   Other Itching, Rash and Other (See Comments)    VICRYL Sutures   Tape Itching, Other (See Comments) and Rash    Occlusive tape and bandaids    Home Medications    Prior to Admission medications   Medication Sig Start Date End Date Taking? Authorizing Provider  Ascorbic Acid (VITAMIN C) 1000 MG tablet Take 1,000 mg by mouth daily. 12/31/18   [provider]  aspirin EC 325 MG tablet Take 325 mg by mouth daily. 11/21/15   [provider]  chlorhexidine (HIBICLENS) 4 % external liquid Apply 1 Application topically See admin instructions. Apply daily for the first week of every month    [provider]  Cholecalciferol (VITAMIN D3) 50 MCG (2000 UT) capsule Take 4,000 Units by mouth daily. 12/22/18   [provider]  diazepam (VALIUM) 2 MG tablet Take 2 mg by mouth. 11/04/15   [provider]  diphenhydrAMINE (BENADRYL ALLERGY) 25 MG tablet     [provider]  Ferrous Sulfate (IRON PO) Take by mouth daily. 11/21/15   [provider]  finasteride (PROSCAR) 5 MG tablet Take 5 mg by mouth daily. 05/13/20   [provider]  fluticasone (FLONASE) 50 MCG/ACT nasal spray Place 2 sprays into both nostrils daily.     [provider]  Fluticasone-Umeclidin-Vilant (TRELEGY ELLIPTA) 200-62.5-25 MCG/ACT AEPB USE 1 INHALATION BY MOUTH ONCE  DAILY AT THE SAME TIME EACH DAY 02/02/22   Hunsucker, Bonna Gains, MD  furosemide (LASIX) 20 MG tablet Take 1 tablet by mouth once daily 01/09/21   Josue Hector, MD  gabapentin (NEURONTIN) 300 MG capsule Take 300 mg by mouth 6 (six) times daily.    [provider]  Guaifenesin (MUCINEX MAXIMUM STRENGTH) 1200 MG TB12 Take 1,200 mg by mouth 2 (two) times daily.    [provider]  hydrochlorothiazide (HYDRODIURIL) 25 MG tablet Take 25 mg by mouth daily. 11/21/15   [provider]  ibuprofen (ADVIL,MOTRIN)  200 MG tablet Take 400 mg by mouth 4 (four) times daily as needed for moderate pain.    [provider]  Ipratropium-Albuterol (COMBIVENT RESPIMAT) 20-100 MCG/ACT AERS respimat Inhale 1 puff into the lungs every 6 (six) hours as needed for wheezing or shortness of breath. 11/17/20   Hunsucker, Bonna Gains, MD  latanoprost (XALATAN) 0.005 % ophthalmic solution Place 1 drop into both eyes at bedtime.  09/10/12   [provider]  magnesium hydroxide (MILK OF MAGNESIA) 400 MG/5ML suspension Take 15 mLs by mouth at bedtime.    [provider]  metoprolol succinate (TOPROL-XL) 100 MG 24 hr tablet Take 100 mg by mouth at bedtime. Take with or immediately following a meal.  [provider]  montelukast (SINGULAIR) 10 MG tablet TAKE 1 TABLET BY MOUTH AT  BEDTIME 12/04/21   Hunsucker, Bonna Gains, MD  Multiple Vitamin (MULTIVITAMIN WITH MINERALS) TABS tablet Take 1 tablet by mouth daily.    [provider]  Multiple Vitamins-Minerals (PRESERVISION AREDS 2+MULTI VIT) CAPS Take 1 capsule by mouth in the morning and at bedtime. 04/13/19   [provider]  omeprazole (PRILOSEC) 40 MG capsule Take 1 capsule (40 mg total) by mouth daily before breakfast. 09/20/20   Hunsucker, Bonna Gains, MD  Oxycodone HCl 10 MG TABS Take 10 mg by mouth every 4 (four) hours as needed (pain). 08/13/19   [provider]  oxymetazoline (AFRIN) 0.05 % nasal spray Place 2 sprays into both nostrils 2 (two) times daily as needed for congestion.    [provider]  Propylene Glycol, PF, (SYSTANE COMPLETE PF) 0.6 % SOLN Place 1 drop into both eyes 3 (three) times daily as needed (dry eyes).    [provider]  pseudoephedrine (SUDAFED) 30 MG tablet Take 60 mg by mouth every 6 (six) hours as needed for congestion.    [provider]  ramipril (ALTACE) 10 MG capsule Take 10 mg by mouth daily. 11/21/15   [provider]  simethicone (MYLICON) 0000000 MG chewable  tablet Chew 125 mg by mouth. 11/21/15   [provider]  Simethicone 125 MG CAPS Take 250 mg by mouth 4 (four) times daily as needed (for gas).    [provider]  simvastatin (ZOCOR) 20 MG tablet Take 20 mg by mouth at bedtime.     [provider]  sodium chloride (OCEAN) 0.65 % SOLN nasal spray Place 1 spray into both nostrils as needed for congestion. As needed    [provider]  spironolactone (ALDACTONE) 25 MG tablet Take 12.5 mg by mouth at bedtime.     [provider]  Tamsulosin HCl (FLOMAX) 0.4 MG CAPS Take 0.4 mg by mouth 2 (two) times daily.    [provider]  telmisartan (MICARDIS) 40 MG tablet Take 40 mg by mouth in the morning and at bedtime. 01/05/22   [provider]    Physical Exam    Vital Signs:  AIREN MOTTE does not have vital signs available for review today.  Given telephonic nature of communication, physical exam is limited. AAOx3. NAD. Normal affect.  Speech and respirations are unlabored.  Accessory Clinical Findings    None  Assessment & Plan    1.  Preoperative Cardiovascular Risk Assessment:  Per Dr. Johnsie Cancel: "He has no documented CAD, has been stable with mild AS ok to proceed with lumbar surgery without further testing ."   A copy of this note will be routed to requesting surgeon.  Time:   Today, I have spent 10 minutes with the patient with telehealth technology discussing medical history, symptoms, and management plan.     Cody Lin Graelyn Bihl, PA  02/28/2022, 10:20 AM

## 2022-02-28 NOTE — Telephone Encounter (Signed)
Called pt per Dr. Johnsie Cancel, and canceled in office pre-op clearance. Nishan stated pt did not need ov appt.

## 2022-02-28 NOTE — Addendum Note (Signed)
Addended by: Minette Brine on: 02/28/2022 11:30 AM   Modules accepted: Level of Service

## 2022-03-02 ENCOUNTER — Telehealth: Payer: Self-pay | Admitting: Pulmonary Disease

## 2022-03-02 NOTE — Telephone Encounter (Signed)
Fax received from Dr. [name] with [facility] to perform a L5-S1 ANTEIOR LUMBAR FUSION on patient.  Patient needs surgery clearance. Surgery is 03/16/2022. Patient was seen on 06/22/2020. Office protocol is a risk assessment can be sent to surgeon if patient has been seen in 60 days or less.   Sending to Dr. [name] for risk assessment or recommendations if patient needs to be seen in office prior to surgical procedure.    Patient has office visit with [facility] on 03/07/2022 to get cleared for surgery

## 2022-03-05 ENCOUNTER — Ambulatory Visit: Payer: Medicare Other | Admitting: Physician Assistant

## 2022-03-06 ENCOUNTER — Encounter: Payer: Self-pay | Admitting: Vascular Surgery

## 2022-03-06 ENCOUNTER — Ambulatory Visit (INDEPENDENT_AMBULATORY_CARE_PROVIDER_SITE_OTHER): Payer: Medicare Other | Admitting: Vascular Surgery

## 2022-03-06 VITALS — BP 162/81 | HR 64 | Temp 97.8°F | Resp 18 | Ht 66.5 in | Wt 215.0 lb

## 2022-03-06 DIAGNOSIS — M48061 Spinal stenosis, lumbar region without neurogenic claudication: Secondary | ICD-10-CM | POA: Diagnosis not present

## 2022-03-06 NOTE — Progress Notes (Signed)
[name] MRN: [id number] DOB: 07/12/1950 Sex: male  REASON FOR CONSULT: Evaluate for anterior spine exposure for L5-S1 ALIF  HPI: Cody Tate is a 72 y.o. male, with history COPD, hypertension, chronic back pain who presents for evaluation of anterior spine exposure for L5-S1 ALIF.  Patient states that his back pain has been ongoing for years.  States he has really unsteady balance and has a hard time walking.  Also having to use a walker or a cane.  Has had multiple previous posterior back surgeries with evidence of fusion from L1-L5.  Evaluated by Dr. 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  He has recommended an L5-S1 ALIF and asked vascular surgery to assist with anterior spine exposure.  Patient denies any previous abdominal surgeries.  He has had a spinal stimulator implanted in the back.  Past Medical History:  Diagnosis Date   Arthritis    Back pain    Radiates down both legs   BPH (benign prostatic hyperplasia)    Cancer (HCC)    prostate cancer   COPD (chronic obstructive pulmonary disease) (HCC)    Dyspnea    Enlarged prostate    GERD (gastroesophageal reflux disease)    Glaucoma    H/O hiatal hernia    H/O wheezing    occ inhaler usage   Heart murmur    Hypertension    Lumbar foraminal stenosis    Macular degeneration    Meningioma (HCC)    PONV (postoperative nausea and vomiting)    Pulmonary nodules    resolved on 2019 follow up CT   S/P insertion of spinal cord stimulator     Past Surgical History:  Procedure Laterality Date   BACK SURGERY  06,07,08   lam x3 cerv x 1   CERVICAL FUSION     CYSTOSCOPY     72 yrs old   CYSTOSCOPY WITH URETHRAL DILATATION N/A 09/21/2021   Procedure: CYSTOSCOPY WITH BALLOON DILATION and OPTILUME BALLOON DILATION OF  URETHRAL DILATATION;  Surgeon: Robley Fries, MD;  [facility];  Service: Urology;  Laterality: N/A;   EYE  SURGERY     bil   GOLD SEED IMPLANT N/A 09/06/2020   Procedure: GOLD SEED IMPLANT/ PLACEMENT OF FIDUCIAL MARKERS;  Surgeon: Robley Fries, MD;  [facility];  Service: Urology;  Laterality: N/A;   KNEE ARTHROSCOPY  10   lft   KNEE ARTHROSCOPY Left 07/23/2012   Procedure: LEFT MEDIAL MENISCAL DEBRIDEMENT AND CHONDROPLASTY;  Surgeon: [name], MD;  [facility];  Service: Orthopedics;  Laterality: Left;   LUMBAR FUSION  02/22/2014   DR POOL   PILONIDAL CYST EXCISION     SINUS EXPLORATION     SPACE OAR INSTILLATION N/A 09/06/2020   Procedure: SPACE OAR INSTILLATION;  Surgeon: Robley Fries, MD;  [facility];  Service: Urology;  Laterality: N/A;   SPINAL CORD STIMULATOR INSERTION N/A 01/16/2018   Procedure: LUMBAR SPINAL CORD STIMULATOR INSERTION;  Surgeon: [name], MD;  Location: [place];  Service: Neurosurgery;  Laterality: N/A;  LUMBAR SPINAL CORD STIMULATOR INSERTION   TRANSURETHRAL RESECTION OF PROSTATE N/A 09/06/2020   Procedure: TRANSURETHRAL RESECTION OF THE PROSTATE (TURP);  Surgeon: Robley Fries, MD;  [facility];  Service: Urology;  Laterality: N/A;  2 HRS   WRIST GANGLION EXCISION     rt   WRIST SURGERY  09  cyst lft    Family History  Problem Relation Age of Onset   COPD Maternal Uncle     SOCIAL HISTORY: Social History   Socioeconomic History   Marital status: Married    Spouse name: Not on file   Number of children: Not on file   Years of education: Not on file   Highest education level: Not on file  Occupational History   Not on file  Tobacco Use   Smoking status: Former    Packs/day: 1.50    Years: 35.00    Total pack years: 52.50    Types: Cigarettes    Quit date: 05/03/2000    Years since quitting: 21.8   Smokeless tobacco: Never   Tobacco comments:    quit alcohol 73  Vaping Use   Vaping Use: Never used  Substance and Sexual Activity   Alcohol use: Not Currently    Comment: hx of years ago   Drug use: No    Sexual activity: Not Currently  Other Topics Concern   Not on file  Social History Narrative   08-20-17 Unable to ask abuse questions wife with him today.   Social Determinants of Health   Financial Resource Strain: Not on file  Food Insecurity: Not on file  Transportation Needs: Not on file  Physical Activity: Not on file  Stress: Not on file  Social Connections: Not on file  Intimate Partner Violence: Not on file    Allergies  Allergen Reactions   Other Itching, Rash and Other (See Comments)    VICRYL Sutures   Tape Itching, Other (See Comments) and Rash    Occlusive tape and bandaids    Current Outpatient Medications  Medication Sig Dispense Refill   Ascorbic Acid (VITAMIN C) 1000 MG tablet Take 1,000 mg by mouth daily.     chlorhexidine (HIBICLENS) 4 % external liquid Apply 1 Application topically See admin instructions. Apply daily for the first week of every month     Cholecalciferol (VITAMIN D3) 50 MCG (2000 UT) capsule Take 4,000 Units by mouth daily.     diphenhydrAMINE (BENADRYL ALLERGY) 25 MG tablet      finasteride (PROSCAR) 5 MG tablet Take 5 mg by mouth daily.     fluticasone (FLONASE) 50 MCG/ACT nasal spray Place 2 sprays into both nostrils daily.      Fluticasone-Umeclidin-Vilant (TRELEGY ELLIPTA) 200-62.5-25 MCG/ACT AEPB USE 1 INHALATION BY MOUTH ONCE  DAILY AT THE SAME TIME EACH DAY 180 each 0   furosemide (LASIX) 20 MG tablet Take 1 tablet by mouth once daily 90 tablet 1   gabapentin (NEURONTIN) 300 MG capsule Take 300 mg by mouth 6 (six) times daily.     Guaifenesin (MUCINEX MAXIMUM STRENGTH) 1200 MG TB12 Take 1,200 mg by mouth 2 (two) times daily.     hydrochlorothiazide (HYDRODIURIL) 25 MG tablet Take 25 mg by mouth daily.     ibuprofen (ADVIL,MOTRIN) 200 MG tablet Take 400 mg by mouth 4 (four) times daily as needed for moderate pain.     Ipratropium-Albuterol (COMBIVENT RESPIMAT) 20-100 MCG/ACT AERS respimat Inhale 1 puff into the lungs every 6 (six)  hours as needed for wheezing or shortness of breath. 12 g 3   latanoprost (XALATAN) 0.005 % ophthalmic solution Place 1 drop into both eyes at bedtime.      magnesium hydroxide (MILK OF MAGNESIA) 400 MG/5ML suspension Take 15 mLs by mouth at bedtime.     metoprolol succinate (TOPROL-XL) 100 MG 24 hr tablet Take 100  mg by mouth at bedtime. Take with or immediately following a meal.     montelukast (SINGULAIR) 10 MG tablet TAKE 1 TABLET BY MOUTH AT  BEDTIME 90 tablet 3   Multiple Vitamin (MULTIVITAMIN WITH MINERALS) TABS tablet Take 1 tablet by mouth daily.     Multiple Vitamins-Minerals (PRESERVISION AREDS 2+MULTI VIT) CAPS Take 1 capsule by mouth in the morning and at bedtime.     omeprazole (PRILOSEC) 40 MG capsule Take 1 capsule (40 mg total) by mouth daily before breakfast. 90 capsule 3   Oxycodone HCl 10 MG TABS Take 10 mg by mouth every 4 (four) hours as needed (pain).     oxymetazoline (AFRIN) 0.05 % nasal spray Place 2 sprays into both nostrils 2 (two) times daily as needed for congestion.     Propylene Glycol, PF, (SYSTANE COMPLETE PF) 0.6 % SOLN Place 1 drop into both eyes 3 (three) times daily as needed (dry eyes).     pseudoephedrine (SUDAFED) 30 MG tablet Take 60 mg by mouth every 6 (six) hours as needed for congestion.     Simethicone 125 MG CAPS Take 250 mg by mouth 4 (four) times daily as needed (for gas).     simvastatin (ZOCOR) 20 MG tablet Take 20 mg by mouth at bedtime.      sodium chloride (OCEAN) 0.65 % SOLN nasal spray Place 1 spray into both nostrils as needed for congestion. As needed     spironolactone (ALDACTONE) 25 MG tablet Take 12.5 mg by mouth at bedtime.      Tamsulosin HCl (FLOMAX) 0.4 MG CAPS Take 0.4 mg by mouth 2 (two) times daily.     telmisartan (MICARDIS) 40 MG tablet Take 40 mg by mouth in the morning and at bedtime.     aspirin EC 325 MG tablet Take 325 mg by mouth daily.     diazepam (VALIUM) 2 MG tablet Take 2 mg by mouth.     Ferrous Sulfate (IRON PO)  Take by mouth daily.     ramipril (ALTACE) 10 MG capsule Take 10 mg by mouth daily.     simethicone (MYLICON) 0000000 MG chewable tablet Chew 125 mg by mouth.     No current facility-administered medications for this visit.    REVIEW OF SYSTEMS:  '[X]'$  denotes positive finding, '[ ]'$  denotes negative finding Cardiac  Comments:  Chest pain or chest pressure:    Shortness of breath upon exertion:    Short of breath when lying flat:    Irregular heart rhythm:        Vascular    Pain in calf, thigh, or hip brought on by ambulation:    Pain in feet at night that wakes you up from your sleep:     Blood clot in your veins:    Leg swelling:         Pulmonary    Oxygen at home:    Productive cough:     Wheezing:         Neurologic    Sudden weakness in arms or legs:     Sudden numbness in arms or legs:     Sudden onset of difficulty speaking or slurred speech:    Temporary loss of vision in one eye:     Problems with dizziness:         Gastrointestinal    Blood in stool:     Vomited blood:         Genitourinary    Burning when  urinating:     Blood in urine:        Psychiatric    Major depression:         Hematologic    Bleeding problems:    Problems with blood clotting too easily:        Skin    Rashes or ulcers:        Constitutional    Fever or chills:      PHYSICAL EXAM: Vitals:   03/06/22 1521  BP: (!) 162/81  Pulse: 64  Resp: 18  Temp: 97.8 F (36.6 C)  TempSrc: Temporal  SpO2: 94%  Weight: 215 lb (97.5 kg)  Height: 5' 6.5" (1.689 m)    GENERAL: The patient is a well-nourished male, in no acute distress. The vital signs are documented above. CARDIAC: There is a regular rate and rhythm.  VASCULAR:  Bilateral femoral pulses palpable Bilateral DP pulses palpable PULMONARY: No respiratory distress. ABDOMEN: Soft and non-tender. MUSCULOSKELETAL: There are no major deformities or cyanosis. NEUROLOGIC: No focal weakness or paresthesias are detected. SKIN:  There are no ulcers or rashes noted. PSYCHIATRIC: The patient has a normal affect.  DATA:   CT Lumbar Spine reviewed     Assessment/Plan:  72 y.o. male, with history COPD, hypertension, chronic back pain who presents for evaluation of anterior spine exposure for L5-S1 ALIF.  I have reviewed his recent CT scan and discussed I think he should be a good candidate for anterior approach.  I discussed transverse incision over the left rectus muscle and mobilizing the left rectus to the midline and entering into the retroperitoneum and mobilizing peritoneum and left ureter across midline.  Discussed mobilizing iliac artery and vein.  Discussed risk of injury to the above structures.  Look forward to assisting Dr. Annette Stable.  All questions answered.  Of note he has had a lot of posterior surgery with prior decompression and fusion from L1-L5 and spinal stimulator as well.   Marty Heck, MD [facility] Office: [phone]

## 2022-03-07 ENCOUNTER — Encounter: Payer: Self-pay | Admitting: Pulmonary Disease

## 2022-03-07 ENCOUNTER — Ambulatory Visit (INDEPENDENT_AMBULATORY_CARE_PROVIDER_SITE_OTHER): Payer: Medicare Other | Admitting: Pulmonary Disease

## 2022-03-07 VITALS — BP 128/70 | HR 60 | Temp 97.9°F | Wt 219.8 lb

## 2022-03-07 DIAGNOSIS — J42 Unspecified chronic bronchitis: Secondary | ICD-10-CM | POA: Diagnosis not present

## 2022-03-07 DIAGNOSIS — M48061 Spinal stenosis, lumbar region without neurogenic claudication: Secondary | ICD-10-CM | POA: Diagnosis not present

## 2022-03-07 NOTE — Telephone Encounter (Signed)
OV notes and clearance form have been faxed back to [facility] . Nothing further needed at this time.

## 2022-03-07 NOTE — Progress Notes (Signed)
Synopsis: Referred in January 2021 for CODP by [name], MD.  Subjective:   PATIENT ID: Cody Tate GENDER: male DOB: April 13, 1950, MRN: [id number]  Chief Complaint  Patient presents with   Follow-up    Pt is here today for surgical clearance. Surg is 3/15/ Pt is also here for follow up for COPD. Pt is on Trelegy daily with no issues noted at this time     Cody Tate is a 72 y.o. gentleman who presents for follow-up of COPD.   He is accompanied today by his wife.   No wheezing, cough.  Morning sputum production, which is his baseline.  Trelegy and 1 puff high dose  Breathing stable.  He continues on montelukast, sudafed,and Flonase for allergic rhinitis. Continuing afrin and sinus rinses.  Last week, had increased congestion.  Slightly increased shortness of breath.  No fever or chills or sore throat to suggest infection.  It was a side effect of a blood pressure medicine, described side effect of new blood pressure medicine he had started.  This is now resolved.  I think it related to the medication.  We reviewed his surgical risk in detail.  Recent Cardiology visit reviewed.  Recent orthopedic surgery and vascular surgery notes reviewed.        Past Medical History:  Diagnosis Date   Arthritis    Back pain    Radiates down both legs   BPH (benign prostatic hyperplasia)    Cancer (HCC)    prostate cancer   COPD (chronic obstructive pulmonary disease) (HCC)    Dyspnea    Enlarged prostate    GERD (gastroesophageal reflux disease)    Glaucoma    H/O hiatal hernia    H/O wheezing    occ inhaler usage   Heart murmur    Hypertension    Lumbar foraminal stenosis    Macular degeneration    Meningioma (HCC)    PONV (postoperative nausea and vomiting)    Pulmonary nodules    resolved on 2019 follow up CT   S/P insertion of spinal cord stimulator      Family History  Problem Relation Age of Onset   COPD Maternal Uncle      Past Surgical History:  Procedure  Laterality Date   BACK SURGERY  06,07,08   lam x3 cerv x 1   CERVICAL FUSION     CYSTOSCOPY     72 yrs old   CYSTOSCOPY WITH URETHRAL DILATATION N/A 09/21/2021   Procedure: CYSTOSCOPY WITH BALLOON DILATION and OPTILUME BALLOON DILATION OF  URETHRAL DILATATION;  Surgeon: Robley Fries, MD;  Location: [facility];  Service: Urology;  Laterality: N/A;   EYE SURGERY     bil   GOLD SEED IMPLANT N/A 09/06/2020   Procedure: GOLD SEED IMPLANT/ PLACEMENT OF FIDUCIAL MARKERS;  Surgeon: Robley Fries, MD;  Location: [facility];  Service: Urology;  Laterality: N/A;   KNEE ARTHROSCOPY  10   lft   KNEE ARTHROSCOPY Left 07/23/2012   Procedure: LEFT MEDIAL MENISCAL DEBRIDEMENT AND CHONDROPLASTY;  Surgeon: [name], MD;  Location: [facility];  Service: Orthopedics;  Laterality: Left;   LUMBAR FUSION  02/22/2014   DR POOL   PILONIDAL CYST EXCISION     SINUS EXPLORATION     SPACE OAR INSTILLATION N/A 09/06/2020   Procedure: SPACE OAR INSTILLATION;  Surgeon: Robley Fries, MD;  Location: [facility];  Service: Urology;  Laterality: N/A;   SPINAL CORD STIMULATOR INSERTION N/A 01/16/2018  Procedure: LUMBAR SPINAL CORD STIMULATOR INSERTION;  Surgeon: [name], MD;  Location: [place];  Service: Neurosurgery;  Laterality: N/A;  LUMBAR SPINAL CORD STIMULATOR INSERTION   TRANSURETHRAL RESECTION OF PROSTATE N/A 09/06/2020   Procedure: TRANSURETHRAL RESECTION OF THE PROSTATE (TURP);  Surgeon: Robley Fries, MD;  Location: [facility];  Service: Urology;  Laterality: N/A;  2 HRS   WRIST GANGLION EXCISION     rt   WRIST SURGERY  09   cyst lft    Social History   Socioeconomic History   Marital status: Married    Spouse name: Not on file   Number of children: Not on file   Years of education: Not on file   Highest education level: Not on file  Occupational History   Not on file  Tobacco Use   Smoking status: Former    Packs/day: 1.50    Years: 35.00    Total pack years: 52.50    Types: Cigarettes    Quit  date: 05/03/2000    Years since quitting: 21.8   Smokeless tobacco: Never   Tobacco comments:    quit alcohol 73  Vaping Use   Vaping Use: Never used  Substance and Sexual Activity   Alcohol use: Not Currently    Comment: hx of years ago   Drug use: No   Sexual activity: Not Currently  Other Topics Concern   Not on file  Social History Narrative   08-20-17 Unable to ask abuse questions wife with him today.   Social Determinants of Health   Financial Resource Strain: Not on file  Food Insecurity: Not on file  Transportation Needs: Not on file  Physical Activity: Not on file  Stress: Not on file  Social Connections: Not on file  Intimate Partner Violence: Not on file     Allergies  Allergen Reactions   Other Itching, Rash and Other (See Comments)    VICRYL Sutures   Tape Itching, Other (See Comments) and Rash    Occlusive tape and bandaids     Immunization History  Administered Date(s) Administered   DTP 06/01/2001   Fluad Quad(high Dose 65+) 09/16/2019   Influenza Split 10/16/2010, 10/08/2012, 10/24/2012, 10/20/2013   Influenza, High Dose Seasonal PF 10/22/2014, 09/07/2015   Influenza-Unspecified 10/03/2018   PFIZER(Purple Top)SARS-COV-2 Vaccination 02/13/2019, 03/10/2019, 10/05/2019, 04/19/2020   Pfizer Covid-19 Vaccine Bivalent Booster 89yr & up 09/29/2020   Pneumococcal Conjugate-13 10/22/2014   Pneumococcal Polysaccharide-23 02/10/2015   Tdap 09/18/2011, 05/17/2012   Zoster Recombinat (Shingrix) 08/18/2018   Zoster, Live 08/04/2010    Outpatient Medications Prior to Visit  Medication Sig Dispense Refill   Ascorbic Acid (VITAMIN C) 1000 MG tablet Take 1,000 mg by mouth daily.     chlorhexidine (HIBICLENS) 4 % external liquid Apply 1 Application topically See admin instructions. Apply daily for the first week of every month     Cholecalciferol (VITAMIN D3) 50 MCG (2000 UT) capsule Take 4,000 Units by mouth daily.     diphenhydrAMINE (BENADRYL ALLERGY) 25 MG  tablet      finasteride (PROSCAR) 5 MG tablet Take 5 mg by mouth daily.     fluticasone (FLONASE) 50 MCG/ACT nasal spray Place 2 sprays into both nostrils daily.      Fluticasone-Umeclidin-Vilant (TRELEGY ELLIPTA) 200-62.5-25 MCG/ACT AEPB USE 1 INHALATION BY MOUTH ONCE  DAILY AT THE SAME TIME EACH DAY 180 each 0   furosemide (LASIX) 20 MG tablet Take 1 tablet by mouth once daily 90 tablet 1  gabapentin (NEURONTIN) 300 MG capsule Take 300 mg by mouth 6 (six) times daily.     Guaifenesin (MUCINEX MAXIMUM STRENGTH) 1200 MG TB12 Take 1,200 mg by mouth 2 (two) times daily.     hydrochlorothiazide (HYDRODIURIL) 25 MG tablet Take 25 mg by mouth daily.     ibuprofen (ADVIL,MOTRIN) 200 MG tablet Take 400 mg by mouth 4 (four) times daily as needed for moderate pain.     Ipratropium-Albuterol (COMBIVENT RESPIMAT) 20-100 MCG/ACT AERS respimat Inhale 1 puff into the lungs every 6 (six) hours as needed for wheezing or shortness of breath. 12 g 3   latanoprost (XALATAN) 0.005 % ophthalmic solution Place 1 drop into both eyes at bedtime.      magnesium hydroxide (MILK OF MAGNESIA) 400 MG/5ML suspension Take 15 mLs by mouth at bedtime.     metoprolol succinate (TOPROL-XL) 100 MG 24 hr tablet Take 100 mg by mouth at bedtime. Take with or immediately following a meal.     montelukast (SINGULAIR) 10 MG tablet TAKE 1 TABLET BY MOUTH AT  BEDTIME 90 tablet 3   Multiple Vitamin (MULTIVITAMIN WITH MINERALS) TABS tablet Take 1 tablet by mouth daily.     Multiple Vitamins-Minerals (PRESERVISION AREDS 2+MULTI VIT) CAPS Take 1 capsule by mouth in the morning and at bedtime.     omeprazole (PRILOSEC) 40 MG capsule Take 1 capsule (40 mg total) by mouth daily before breakfast. 90 capsule 3   Oxycodone HCl 10 MG TABS Take 10 mg by mouth every 4 (four) hours as needed (pain).     oxymetazoline (AFRIN) 0.05 % nasal spray Place 2 sprays into both nostrils 2 (two) times daily as needed for congestion.     Propylene Glycol, PF,  (SYSTANE COMPLETE PF) 0.6 % SOLN Place 1 drop into both eyes 3 (three) times daily as needed (dry eyes).     pseudoephedrine (SUDAFED) 30 MG tablet Take 60 mg by mouth every 6 (six) hours as needed for congestion.     Simethicone 125 MG CAPS Take 250 mg by mouth 4 (four) times daily as needed (for gas).     simvastatin (ZOCOR) 20 MG tablet Take 20 mg by mouth at bedtime.      sodium chloride (OCEAN) 0.65 % SOLN nasal spray Place 1 spray into both nostrils as needed for congestion. As needed     spironolactone (ALDACTONE) 25 MG tablet Take 12.5 mg by mouth at bedtime.      Tamsulosin HCl (FLOMAX) 0.4 MG CAPS Take 0.4 mg by mouth 2 (two) times daily.     telmisartan (MICARDIS) 40 MG tablet Take 40 mg by mouth in the morning and at bedtime.     aspirin EC 325 MG tablet Take 325 mg by mouth daily.     diazepam (VALIUM) 2 MG tablet Take 2 mg by mouth.     Ferrous Sulfate (IRON PO) Take by mouth daily.     ramipril (ALTACE) 10 MG capsule Take 10 mg by mouth daily.     simethicone (MYLICON) 0000000 MG chewable tablet Chew 125 mg by mouth.     No facility-administered medications prior to visit.       Objective:   Vitals:   03/07/22 0947  BP: 128/70  Pulse: 60  Temp: 97.9 F (36.6 C)  TempSrc: Oral  SpO2: 98%  Weight: 219 lb 12.8 oz (99.7 kg)   98% on   RA BMI Readings from Last 3 Encounters:  03/07/22 34.95 kg/m  03/06/22 34.18 kg/m  10/24/21 35.23 kg/m   Wt Readings from Last 3 Encounters:  03/07/22 219 lb 12.8 oz (99.7 kg)  03/06/22 215 lb (97.5 kg)  10/24/21 221 lb 9.6 oz (100.5 kg)    Physical Exam Gen: well appearing, in NAD Eyes: EOMI, no icterus Pulm: CTAB, NWOB on RA CV: RRR, no murmurs  CBC    Component Value Date/Time   WBC 6.2 09/18/2021 1127   RBC 4.61 09/18/2021 1127   HGB 13.7 09/18/2021 1127   HCT 42.8 09/18/2021 1127   PLT 263 09/18/2021 1127   MCV 92.8 09/18/2021 1127   MCH 29.7 09/18/2021 1127   MCHC 32.0 09/18/2021 1127   RDW 12.6 09/18/2021  1127   LYMPHSABS 1.3 02/12/2014 1313   MONOABS 0.7 02/12/2014 1313   EOSABS 0.1 02/12/2014 1313   BASOSABS 0.1 02/12/2014 1313    CHEMISTRY No results for input(s): "NA", "K", "CL", "CO2", "GLUCOSE", "BUN", "CREATININE", "CALCIUM", "MG", "PHOS" in the last 168 hours. CrCl cannot be calculated (Patient's most recent lab result is older than the maximum 21 days allowed.).   Chest Imaging- films reviewed: CT chest 03/01/2017- moderate airway thickening, likely mild bronchiectasis in lower lobes.  No significant emphysema.  No mediastinal or hilar adenopathy.  Patulous esophagus.  01/30/19 bilateral lower extremity ultrasound-no DVT. Baker's cyst on the right.  Pulmonary Functions Testing Results:     No data to display          Per [facility] records 2018: spirometry 01/2013 FEV1/FVC equals 64%, FEV1 equals 72% mild COPD  Spirometry 2015: FVC 3.47 (83%)--> 3.59 (86%, +3%) FEV1 2.24 (72%)--> 2.37 (76%, +6%)  Ratio 65 Flow volume loop supports obstruction  Echocardiogram 01/10/2018: LVEF 65 to 70%.  Grade 1 diastolic dysfunction, elevated LVEDP.  Mild AS.  LA, RV, RA.  Trivial MR and TR, otherwise normal valves.  NM SPECT 01/10/2018: LVEF 67%, hyperdynamic function.  Abnormal baseline EKG, no ischemic changes during test.  Normal myocardial perfusion; low risk study.    Assessment & Plan:     ICD-10-CM   1. Chronic bronchitis, unspecified chronic bronchitis type ([place])  J42       COPD-  Question if he has an asthma overlap.  History of frequent exacerbations, none in the last year plus. GOLD D. -Continue triple inhaled therapy- Trelegy -Continue Combivent rescue inhaler every 4 hours as needed.   Preoperative evaluation: Pulmonary medicine does not provide preoperative clearance rather preoperative risk assessment.  Based on the ARISCAT model, patient is low at 1.3% risk of postoperative pulmonary clearance assuming duration of surgery less than 3 hours.  If duration of surgery is  greater than 3 hours, patient is intermediate at 13.3% risk of postoperative pulmonary complication.  There are no modifiable risk factors.  See below. -- Recommend DuoNebs administration in the preoperative area and in the PACU -- May be wise to check a blood gas intraoperatively and if CO2 is elevated, consider extubation to noninvasive positive pressure ventilation if surgery allows  RTC in as needed with Dr. [name].     Lanier Clam, MD [facility] 03/07/2022 10:13 AM

## 2022-03-07 NOTE — Patient Instructions (Addendum)
Nice to see you again  I am glad you are doing well  Recommend continuing the Trelegy every day  We will send over my note to the surgeon for preoperative evaluation  Return to clinic as needed

## 2022-03-09 ENCOUNTER — Other Ambulatory Visit: Payer: Self-pay

## 2022-03-09 NOTE — Pre-Procedure Instructions (Signed)
Surgical Instructions    Your procedure is scheduled on March 16, 2022.  Report to [facility] Main Entrance "A" at 5:30 A.M., then check in with the Admitting office.  Call this number if you have problems the morning of surgery:  [phone]  If you have any questions prior to your surgery date call [phone]: Open Monday-Friday 8am-4pm If you experience any cold or flu symptoms such as cough, fever, chills, shortness of breath, etc. between now and your scheduled surgery, please notify us at the above number.     Remember:  Do not eat or drink after midnight the night before your surgery      Take these medicines the morning of surgery with A SIP OF WATER:  finasteride (PROSCAR)   fluticasone (FLONASE) nasal spray   Fluticasone-Umeclidin-Vilant (TRELEGY ELLIPTA)   gabapentin (NEURONTIN)   omeprazole (PRILOSEC)   Tamsulosin HCl [facility])     May take these medicines IF NEEDED:  Ipratropium-Albuterol (COMBIVENT RESPIMAT)   Oxycodone HCl   oxymetazoline (AFRIN) nasal spray   Propylene Glycol, PF, (SYSTANE COMPLETE PF) eye drops  sodium chloride (OCEAN) nasal spray     As of today, STOP taking any Aspirin (unless otherwise instructed by your surgeon) Aleve, Naproxen, Ibuprofen, Motrin, Advil, Goody's, BC's, all herbal medications, fish oil, and all vitamins.                     Do NOT Smoke (Tobacco/Vaping) for 24 hours prior to your procedure.  If you use a CPAP at night, you may bring your mask/headgear for your overnight stay.   Contacts, glasses, piercing's, hearing aid's, dentures or partials may not be worn into surgery, please bring cases for these belongings.    For patients admitted to the hospital, discharge time will be determined by your treatment team.   Patients discharged the day of surgery will not be allowed to drive home, and someone needs to stay with them for 24 hours.  SURGICAL WAITING ROOM VISITATION Patients having surgery or a procedure may  have no more than 2 support people in the waiting area - these visitors may rotate.   Children under the age of 20 must have an adult with them who is not the patient. If the patient needs to stay at the hospital during part of their recovery, the visitor guidelines for inpatient rooms apply. Pre-op nurse will coordinate an appropriate time for 1 support person to accompany patient in pre-op.  This support person may not rotate.   Please refer to the [facility] website for the visitor guidelines for Inpatients (after your surgery is over and you are in a regular room).    Special instructions:   [place]- Preparing For Surgery  Before surgery, you can play an important role. Because skin is not sterile, your skin needs to be as free of germs as possible. You can reduce the number of germs on your skin by washing with CHG (chlorahexidine gluconate) Soap before surgery.  CHG is an antiseptic cleaner which kills germs and bonds with the skin to continue killing germs even after washing.    Oral Hygiene is also important to reduce your risk of infection.  Remember - BRUSH YOUR TEETH THE MORNING OF SURGERY WITH YOUR REGULAR TOOTHPASTE  Please do not use if you have an allergy to CHG or antibacterial soaps. If your skin becomes reddened/irritated stop using the CHG.  Do not shave (including legs and underarms) for at least 48 hours prior to  first CHG shower. It is OK to shave your face.  Please follow these instructions carefully.   Shower the NIGHT BEFORE SURGERY and the MORNING OF SURGERY  If you chose to wash your hair, wash your hair first as usual with your normal shampoo.  After you shampoo, rinse your hair and body thoroughly to remove the shampoo.  Use CHG Soap as you would any other liquid soap. You can apply CHG directly to the skin and wash gently with a scrungie or a clean washcloth.   Apply the CHG Soap to your body ONLY FROM THE NECK DOWN.  Do not use on open wounds or open  sores. Avoid contact with your eyes, ears, mouth and genitals (private parts). Wash Face and genitals (private parts)  with your normal soap.   Wash thoroughly, paying special attention to the area where your surgery will be performed.  Thoroughly rinse your body with warm water from the neck down.  DO NOT shower/wash with your normal soap after using and rinsing off the CHG Soap.  Pat yourself dry with a CLEAN TOWEL.  Wear CLEAN PAJAMAS to bed the night before surgery  Place CLEAN SHEETS on your bed the night before your surgery  DO NOT SLEEP WITH PETS.   Day of Surgery: Take a shower with CHG soap. Do not wear jewelry or makeup Do not wear lotions, powders, perfumes/colognes, or deodorant. Do not shave 48 hours prior to surgery.  Men may shave face and neck. Do not bring valuables to the hospital.  [facility] is not responsible for any belongings or valuables. Do not wear nail polish, gel polish, artificial nails, or any other type of covering on natural nails (fingers and toes) If you have artificial nails or gel coating that need to be removed by a nail salon, please have this removed prior to surgery. Artificial nails or gel coating may interfere with anesthesia's ability to adequately monitor your vital signs.  Wear Clean/Comfortable clothing the morning of surgery Remember to brush your teeth WITH YOUR REGULAR TOOTHPASTE.   Please read over the following fact sheets that you were given.    If you received a COVID test during your pre-op visit  it is requested that you wear a mask when out in public, stay away from anyone that may not be feeling well and notify your surgeon if you develop symptoms. If you have been in contact with anyone that has tested positive in the last 10 days please notify you surgeon.

## 2022-03-12 ENCOUNTER — Other Ambulatory Visit: Payer: Self-pay

## 2022-03-12 ENCOUNTER — Encounter (HOSPITAL_COMMUNITY)
Admission: RE | Admit: 2022-03-12 | Discharge: 2022-03-12 | Disposition: A | Discharge status: Home / Self Care | Payer: Medicare Other | Source: Ambulatory Visit | Attending: Neurosurgery | Admitting: Neurosurgery

## 2022-03-12 ENCOUNTER — Encounter (HOSPITAL_COMMUNITY): Payer: Self-pay

## 2022-03-12 DIAGNOSIS — Z01812 Encounter for preprocedural laboratory examination: Secondary | ICD-10-CM | POA: Diagnosis not present

## 2022-03-12 DIAGNOSIS — Z01818 Encounter for other preprocedural examination: Secondary | ICD-10-CM

## 2022-03-12 DIAGNOSIS — I2510 Atherosclerotic heart disease of native coronary artery without angina pectoris: Secondary | ICD-10-CM | POA: Diagnosis not present

## 2022-03-12 HISTORY — DX: Myoneural disorder, unspecified: G70.9

## 2022-03-12 LAB — SURGICAL PCR SCREEN
MRSA, PCR: POSITIVE — AB
Staphylococcus aureus: POSITIVE — AB

## 2022-03-12 LAB — CBC
HCT: 40 % (ref 39.0–52.0)
Hemoglobin: 13.3 g/dL (ref 13.0–17.0)
MCH: 27.7 pg (ref 26.0–34.0)
MCHC: 33.3 g/dL (ref 30.0–36.0)
MCV: 83.2 fL (ref 80.0–100.0)
Platelets: 259 10*3/uL (ref 150–400)
RBC: 4.81 MIL/uL (ref 4.22–5.81)
RDW: 14.2 % (ref 11.5–15.5)
WBC: 18.7 10*3/uL — ABNORMAL HIGH (ref 4.0–10.5)
nRBC: 0 % (ref 0.0–0.2)

## 2022-03-12 LAB — BASIC METABOLIC PANEL
Anion gap: 15 (ref 5–15)
BUN: 21 mg/dL (ref 8–23)
CO2: 20 mmol/L — ABNORMAL LOW (ref 22–32)
Calcium: 8.8 mg/dL — ABNORMAL LOW (ref 8.9–10.3)
Chloride: 100 mmol/L (ref 98–111)
Creatinine, Ser: 1.09 mg/dL (ref 0.61–1.24)
GFR, Estimated: >60 mL/min (ref >60)
Glucose, Bld: 113 mg/dL — ABNORMAL HIGH (ref 70–99)
Potassium: 4 mmol/L (ref 3.5–5.1)
Sodium: 135 mmol/L (ref 135–145)

## 2022-03-12 LAB — TYPE AND SCREEN
ABO/RH(D): O POS
Antibody Screen: NEGATIVE

## 2022-03-12 NOTE — Progress Notes (Signed)
Dr pool made aware of elevated WBC count at todays PAT appointment.

## 2022-03-12 NOTE — Progress Notes (Signed)
PCP - Dr. Okey Dupre (used to be Dr. [name] but he retired. New PCP works in same office) Cardiologist - Dr. [name] Pulmonologist - Dr. Larey Days  PPM/ICD - Denies Device Orders - n/a Rep Notified - n/a  Chest x-ray - n/a EKG - 09/18/2021 Stress Test - 01/10/2018 ECHO - 06/27/2020 Cardiac Cath - 1998, no stents placed per pt  Sleep Study - Denies CPAP - n/a  No DM  Last dose of GLP1 agonist- n/a  GLP1 instructions: n/a  Blood Thinner Instructions: n/a Aspirin Instructions: n/a  NPO after midnight  COVID TEST- n/a   Anesthesia review: Yes. Cardiac and Pulmonary Clearance.   Patient denies shortness of breath, fever, cough and chest pain at PAT appointment. Pt denies any respiratory illness/infection in the last two months.   All instructions explained to the patient, with a verbal understanding of the material. Patient agrees to go over the instructions while at home for a better understanding. Patient also instructed to self quarantine after being tested for COVID-19. The opportunity to ask questions was provided.

## 2022-03-12 NOTE — Progress Notes (Signed)
Notified Dr. 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 He states to treat pt DOS with Betadine.

## 2022-03-13 NOTE — Progress Notes (Signed)
Anesthesia Chart Review:   Case: [id number] Date/Time: 03/16/22 0715   Procedures:      ALIF - L5-S1 with cage     ABDOMINAL EXPOSURE   Anesthesia type: General   Pre-op diagnosis: Stenosis   Location: [facility] OR ROOM 20 / [facility]   Surgeons: [name], MD; Marty Heck, MD       DISCUSSION: Pt is 72 years old with hx HTN, COPD, heart murmur  Pt's WBC count is 18.7 at pre-admission testing. I spoke with pt by phone. He denies feeling ill - does have some mild "sinus" congestion in last 1-2 days with clear drainage. Does not feel sick. Denies fever, chills, rash, dysuria. Reports pain with dentures and did have a "gum" infection recently that was treated with antibiotics. Does not think he still has a gum infection.   Pt saw dentist 03-13-22; per patient, no signs of dental infection, imaging done as well that did not show infection.  Pt feels well.    I notified Lorriane Shire in Dr. Marchelle Folks office of elevated WBC count.    VS: BP (!) 133/54   Pulse 83   Temp 36.9 C (Oral)   Resp 18   Ht '5\' 6"'$  (1.676 m)   Wt 99.2 kg   SpO2 92%   BMI 35.28 kg/m   PROVIDERS: - PCP is Dr. Okey Dupre (prior PCP Dr. Felipa Eth retired)  - Pulmonologist is Lennox Solders, MD. At last office visit 03/07/22, Dr. Sedalia Muta provided the following risk assessment:  "patient is low at 1.3% risk of postoperative pulmonary clearance assuming duration of surgery less than 3 hours. If duration of surgery is greater than 3 hours, patient is intermediate at 13.3% risk of postoperative pulmonary complication. There are no modifiable risk factors -- Recommend DuoNebs administration in the preoperative area and in the PACU -- May be wise to check a blood gas intraoperatively and if CO2 is elevated, consider extubation to noninvasive positive pressure ventilation if surgery allows"  - Cardiologist is [name], MD. Arletha Pili for surgery at last virtual visit with Fabian Sharp, PA 02/28/22  LABS:  - WBC 18.7,  prior results from 09/2021 and before were normal.   (all labs ordered are listed, but only abnormal results are displayed)  Labs Reviewed  SURGICAL PCR SCREEN - Abnormal; Notable for the following components:      Result Value   MRSA, PCR POSITIVE (*)    Staphylococcus aureus POSITIVE (*)    All other components within normal limits  BASIC METABOLIC PANEL - Abnormal; Notable for the following components:   CO2 20 (*)    Glucose, Bld 113 (*)    Calcium 8.8 (*)    All other components within normal limits  CBC - Abnormal; Notable for the following components:   WBC 18.7 (*)    All other components within normal limits  TYPE AND SCREEN     EKG 09/18/21: Sinus bradycardia. RBBB. T wave abnormality, consider inferolateral ischemia   CV: Echo 06/27/20:  1. Left ventricular ejection fraction, by estimation, is >75%. The left ventricle has hyperdynamic function. The left ventricle has no regional wall motion abnormalities. There is mild left ventricular hypertrophy. Left ventricular diastolic parameters are consistent with Grade I diastolic dysfunction (impaired relaxation). Elevated left atrial pressure.  2. Right ventricular systolic function is normal. The right ventricular size is normal.  3. The mitral valve is normal in structure. Mild mitral valve regurgitation. No evidence of mitral stenosis.  4. The aortic valve  is tricuspid. Aortic valve regurgitation is not visualized. Mild aortic valve sclerosis is present, with no evidence of aortic valve stenosis.  5. The inferior vena cava is normal in size with greater than 50% respiratory variability, suggesting right atrial pressure of 3 mmHg.   Nuclear stress test 01/10/18:  Nuclear stress EF: 67%. The left ventricular ejection fraction is hyperdynamic (>65%). Baseline EKG showed NSR with inferolateral T wave inversions. No changes with Lexiscan infusions. The study is normal. This is a low risk study.   Past Medical History:   Diagnosis Date   Arthritis    Back pain    Radiates down both legs   BPH (benign prostatic hyperplasia)    Cancer ([place]) 2022   prostate cancer   COPD (chronic obstructive pulmonary disease) (HCC)    Dyspnea    Enlarged prostate    GERD (gastroesophageal reflux disease)    Glaucoma    H/O hiatal hernia    H/O wheezing    occ inhaler usage   Heart murmur    Hypertension    Lumbar foraminal stenosis    Macular degeneration    Meningioma ([place]) 2014   Treated with radiation   Neuromuscular disorder (HCC)    Neuropathy bilateral legs   PONV (postoperative nausea and vomiting)    Pulmonary nodules    resolved on 2019 follow up CT   S/P insertion of spinal cord stimulator     Past Surgical History:  Procedure Laterality Date   BACK SURGERY  06/08/2006   lam x3 cerv x 1   CERVICAL FUSION     COLONOSCOPY  2020   CYSTOSCOPY     72 yrs old   CYSTOSCOPY WITH URETHRAL DILATATION N/A 09/21/2021   Procedure: CYSTOSCOPY WITH BALLOON DILATION and OPTILUME BALLOON DILATION OF  URETHRAL DILATATION;  Surgeon: Robley Fries, MD;  Location: [facility];  Service: Urology;  Laterality: N/A;   EYE SURGERY     bil   GOLD SEED IMPLANT N/A 09/06/2020   Procedure: GOLD SEED IMPLANT/ PLACEMENT OF FIDUCIAL MARKERS;  Surgeon: Robley Fries, MD;  Location: [facility];  Service: Urology;  Laterality: N/A;   KNEE ARTHROSCOPY  01/02/2008   lft   KNEE ARTHROSCOPY Left 07/23/2012   Procedure: LEFT MEDIAL MENISCAL DEBRIDEMENT AND CHONDROPLASTY;  Surgeon: [name], MD;  Location: [facility];  Service: Orthopedics;  Laterality: Left;   LUMBAR FUSION  02/22/2014   DR POOL   PILONIDAL CYST EXCISION     SINUS EXPLORATION     SPACE OAR INSTILLATION N/A 09/06/2020   Procedure: SPACE OAR INSTILLATION;  Surgeon: Robley Fries, MD;  Location: [facility];  Service: Urology;  Laterality: N/A;   SPINAL CORD STIMULATOR INSERTION N/A 01/16/2018   Procedure: LUMBAR SPINAL CORD STIMULATOR INSERTION;  Surgeon:  [name], MD;  Location: [facility];  Service: Neurosurgery;  Laterality: N/A;  LUMBAR SPINAL CORD STIMULATOR INSERTION   TRANSURETHRAL RESECTION OF PROSTATE N/A 09/06/2020   Procedure: TRANSURETHRAL RESECTION OF THE PROSTATE (TURP);  Surgeon: Robley Fries, MD;  Location: [facility];  Service: Urology;  Laterality: N/A;  2 HRS   WRIST GANGLION EXCISION     rt   WRIST SURGERY  01/02/2007   cyst lft    MEDICATIONS:  Ascorbic Acid (VITAMIN C) 1000 MG tablet   chlorhexidine (HIBICLENS) 4 % external liquid   Cholecalciferol (VITAMIN D3) 50 MCG (2000 UT) capsule   diphenhydrAMINE (BENADRYL ALLERGY) 25 MG tablet   finasteride (PROSCAR) 5 MG tablet  fluticasone (FLONASE) 50 MCG/ACT nasal spray   Fluticasone-Umeclidin-Vilant (TRELEGY ELLIPTA) 200-62.5-25 MCG/ACT AEPB   furosemide (LASIX) 20 MG tablet   gabapentin (NEURONTIN) 300 MG capsule   Guaifenesin (MUCINEX MAXIMUM STRENGTH) 1200 MG TB12   ibuprofen (ADVIL,MOTRIN) 200 MG tablet   Ipratropium-Albuterol (COMBIVENT RESPIMAT) 20-100 MCG/ACT AERS respimat   latanoprost (XALATAN) 0.005 % ophthalmic solution   magnesium hydroxide (MILK OF MAGNESIA) 400 MG/5ML suspension   metoprolol succinate (TOPROL-XL) 100 MG 24 hr tablet   montelukast (SINGULAIR) 10 MG tablet   Multiple Vitamin (MULTIVITAMIN WITH MINERALS) TABS tablet   Multiple Vitamins-Minerals (PRESERVISION AREDS 2+MULTI VIT) CAPS   omeprazole (PRILOSEC) 40 MG capsule   Oxycodone HCl 10 MG TABS   oxymetazoline (AFRIN) 0.05 % nasal spray   Propylene Glycol, PF, (SYSTANE COMPLETE PF) 0.6 % SOLN   pseudoephedrine (SUDAFED) 30 MG tablet   Simethicone 125 MG CAPS   simvastatin (ZOCOR) 20 MG tablet   sodium chloride (OCEAN) 0.65 % SOLN nasal spray   spironolactone (ALDACTONE) 25 MG tablet   Tamsulosin HCl (FLOMAX) 0.4 MG CAPS   telmisartan (MICARDIS) 40 MG tablet   Zinc 50 MG TABS   No current facility-administered medications for this encounter.    Defer to surgical team to  recheck WBC count day of surgery if desired.   If no changes, I anticipate pt can proceed with surgery as scheduled.   Willeen Cass, PhD, FNP-BC [facility]/Anesthesiology Phone: [phone] 03/15/2022 9:47 AM

## 2022-03-15 NOTE — Anesthesia Preprocedure Evaluation (Addendum)
Anesthesia Evaluation  Patient identified by MRN, date of birth, ID band Patient awake    Reviewed: Allergy & Precautions, NPO status , Patient's Chart, lab work & pertinent test results, reviewed documented beta blocker date and time   History of Anesthesia Complications (+) PONV and history of anesthetic complications  Airway Mallampati: III  TM Distance: >3 FB Neck ROM: Full    Dental  (+) Dental Advisory Given, Missing, Chipped,    Pulmonary shortness of breath, COPD,  COPD inhaler, former smoker   Pulmonary exam normal breath sounds clear to auscultation       Cardiovascular hypertension, Pt. on home beta blockers and Pt. on medications Normal cardiovascular exam Rhythm:Regular Rate:Normal  TTE 2022 1. Left ventricular ejection fraction, by estimation, is >75%. The left  ventricle has hyperdynamic function. The left ventricle has no regional  wall motion abnormalities. There is mild left ventricular hypertrophy.  Left ventricular diastolic parameters  are consistent with Grade I diastolic dysfunction (impaired relaxation).  Elevated left atrial pressure.   2. Right ventricular systolic function is normal. The right ventricular  size is normal.   3. The mitral valve is normal in structure. Mild mitral valve  regurgitation. No evidence of mitral stenosis.   4. The aortic valve is tricuspid. Aortic valve regurgitation is not  visualized. Mild aortic valve sclerosis is present, with no evidence of  aortic valve stenosis.   5. The inferior vena cava is normal in size with greater than 50%  respiratory variability, suggesting right atrial pressure of 3 mmHg.   Stress Test 2020 Nuclear stress EF: 67%. The left ventricular ejection fraction is hyperdynamic (>65%). Baseline EKG showed NSR with inferolateral T wave inversions. No changes with Lexiscan infusions. The study is normal. This is a low risk study.      Neuro/Psych H/o meningioma negative neurological ROS  negative psych ROS   GI/Hepatic hiatal hernia,GERD  ,,(+)     substance abuse    Endo/Other  negative endocrine ROS    Renal/GU negative Renal ROS  negative genitourinary   Musculoskeletal  (+) Arthritis ,  narcotic dependentSpinal cord stimulator, currently off   Abdominal   Peds  Hematology negative hematology ROS (+)   Anesthesia Other Findings   Reproductive/Obstetrics                             Anesthesia Physical Anesthesia Plan  ASA: 3  Anesthesia Plan: General   Post-op Pain Management: Ketamine IV*, Dilaudid IV and Ofirmev IV (intra-op)*   Induction: Intravenous  PONV Risk Score and Plan: 3 and Dexamethasone, Ondansetron and Treatment may vary due to age or medical condition  Airway Management Planned: Oral ETT  Additional Equipment:   Intra-op Plan:   Post-operative Plan: Extubation in OR  Informed Consent: I have reviewed the patients History and Physical, chart, labs and discussed the procedure including the risks, benefits and alternatives for the proposed anesthesia with the patient or authorized representative who has indicated his/her understanding and acceptance.     Dental advisory given  Plan Discussed with: CRNA  Anesthesia Plan Comments: (2 IVs)       Anesthesia Quick Evaluation

## 2022-03-16 ENCOUNTER — Inpatient Hospital Stay (HOSPITAL_COMMUNITY): Payer: Medicare Other | Admitting: Emergency Medicine

## 2022-03-16 ENCOUNTER — Encounter (HOSPITAL_COMMUNITY): Payer: Self-pay | Admitting: Neurosurgery

## 2022-03-16 ENCOUNTER — Inpatient Hospital Stay (HOSPITAL_COMMUNITY)
Admission: RE | Admit: 2022-03-16 | Discharge: 2022-03-17 | DRG: 460 | Disposition: A | Discharge status: Home / Self Care | Payer: Medicare Other | Source: Ambulatory Visit | Attending: Neurosurgery | Admitting: Neurosurgery

## 2022-03-16 ENCOUNTER — Inpatient Hospital Stay (HOSPITAL_COMMUNITY): Payer: Medicare Other

## 2022-03-16 ENCOUNTER — Encounter (HOSPITAL_COMMUNITY)
Admission: RE | Disposition: A | Discharge status: Home / Self Care | Payer: Self-pay | Source: Ambulatory Visit | Attending: Neurosurgery

## 2022-03-16 ENCOUNTER — Other Ambulatory Visit: Payer: Self-pay

## 2022-03-16 ENCOUNTER — Inpatient Hospital Stay (HOSPITAL_COMMUNITY): Payer: Medicare Other | Admitting: General Practice

## 2022-03-16 DIAGNOSIS — J449 Chronic obstructive pulmonary disease, unspecified: Secondary | ICD-10-CM

## 2022-03-16 DIAGNOSIS — K219 Gastro-esophageal reflux disease without esophagitis: Secondary | ICD-10-CM | POA: Diagnosis present

## 2022-03-16 DIAGNOSIS — Z86011 Personal history of benign neoplasm of the brain: Secondary | ICD-10-CM

## 2022-03-16 DIAGNOSIS — Z981 Arthrodesis status: Secondary | ICD-10-CM

## 2022-03-16 DIAGNOSIS — M4807 Spinal stenosis, lumbosacral region: Secondary | ICD-10-CM

## 2022-03-16 DIAGNOSIS — M4317 Spondylolisthesis, lumbosacral region: Secondary | ICD-10-CM

## 2022-03-16 DIAGNOSIS — M5137 Other intervertebral disc degeneration, lumbosacral region: Secondary | ICD-10-CM | POA: Diagnosis present

## 2022-03-16 DIAGNOSIS — I10 Essential (primary) hypertension: Secondary | ICD-10-CM | POA: Diagnosis present

## 2022-03-16 DIAGNOSIS — Z6835 Body mass index (BMI) 35.0-35.9, adult: Secondary | ICD-10-CM | POA: Diagnosis not present

## 2022-03-16 DIAGNOSIS — Z8546 Personal history of malignant neoplasm of prostate: Secondary | ICD-10-CM

## 2022-03-16 DIAGNOSIS — Z923 Personal history of irradiation: Secondary | ICD-10-CM | POA: Diagnosis not present

## 2022-03-16 DIAGNOSIS — Z7982 Long term (current) use of aspirin: Secondary | ICD-10-CM

## 2022-03-16 DIAGNOSIS — Z87891 Personal history of nicotine dependence: Secondary | ICD-10-CM

## 2022-03-16 DIAGNOSIS — Z79899 Other long term (current) drug therapy: Secondary | ICD-10-CM | POA: Diagnosis not present

## 2022-03-16 DIAGNOSIS — Z7951 Long term (current) use of inhaled steroids: Secondary | ICD-10-CM | POA: Diagnosis not present

## 2022-03-16 DIAGNOSIS — H409 Unspecified glaucoma: Secondary | ICD-10-CM | POA: Diagnosis present

## 2022-03-16 DIAGNOSIS — Z825 Family history of asthma and other chronic lower respiratory diseases: Secondary | ICD-10-CM

## 2022-03-16 DIAGNOSIS — H3530 Unspecified macular degeneration: Secondary | ICD-10-CM | POA: Diagnosis present

## 2022-03-16 DIAGNOSIS — M48061 Spinal stenosis, lumbar region without neurogenic claudication: Secondary | ICD-10-CM | POA: Diagnosis present

## 2022-03-16 DIAGNOSIS — Z91048 Other nonmedicinal substance allergy status: Secondary | ICD-10-CM | POA: Diagnosis not present

## 2022-03-16 DIAGNOSIS — Z01818 Encounter for other preprocedural examination: Secondary | ICD-10-CM

## 2022-03-16 DIAGNOSIS — M5136 Other intervertebral disc degeneration, lumbar region: Secondary | ICD-10-CM

## 2022-03-16 DIAGNOSIS — M4326 Fusion of spine, lumbar region: Secondary | ICD-10-CM | POA: Diagnosis not present

## 2022-03-16 DIAGNOSIS — I2510 Atherosclerotic heart disease of native coronary artery without angina pectoris: Secondary | ICD-10-CM

## 2022-03-16 HISTORY — PX: ANTERIOR LUMBAR FUSION: SHX1170

## 2022-03-16 HISTORY — PX: ABDOMINAL EXPOSURE: SHX5708

## 2022-03-16 SURGERY — ANTERIOR LUMBAR FUSION 1 LEVEL
Anesthesia: General

## 2022-03-16 MED ORDER — MUPIROCIN 2 % EX OINT
1.0000 | TOPICAL_OINTMENT | Freq: Two times a day (BID) | CUTANEOUS | Status: DC
  Administered 2022-03-16 – 2022-03-17 (×2): 1 via NASAL
  Filled 2022-03-16: qty 22

## 2022-03-16 MED ORDER — DIPHENHYDRAMINE HCL 25 MG PO CAPS
25.0000 mg | ORAL_CAPSULE | Freq: Every day | ORAL | Status: DC
  Administered 2022-03-16: 25 mg via ORAL
  Filled 2022-03-16: qty 1

## 2022-03-16 MED ORDER — HYDROMORPHONE HCL 1 MG/ML IJ SOLN
INTRAMUSCULAR | Status: AC
  Filled 2022-03-16: qty 0.5

## 2022-03-16 MED ORDER — PROPOFOL 10 MG/ML IV BOLUS
INTRAVENOUS | Status: DC | PRN
  Administered 2022-03-16: 130 mg via INTRAVENOUS

## 2022-03-16 MED ORDER — SIMETHICONE 80 MG PO CHEW: 240.0000 mg | CHEWABLE_TABLET | Freq: Four times a day (QID) | ORAL | Status: DC | PRN

## 2022-03-16 MED ORDER — VANCOMYCIN HCL 1000 MG IV SOLR
INTRAVENOUS | Status: DC | PRN
  Administered 2022-03-16: 1000 mg via TOPICAL

## 2022-03-16 MED ORDER — ROCURONIUM BROMIDE 10 MG/ML (PF) SYRINGE
PREFILLED_SYRINGE | INTRAVENOUS | Status: DC | PRN
  Administered 2022-03-16: 30 mg via INTRAVENOUS
  Administered 2022-03-16: 100 mg via INTRAVENOUS

## 2022-03-16 MED ORDER — HYDROMORPHONE HCL 1 MG/ML IJ SOLN
INTRAMUSCULAR | Status: AC
  Filled 2022-03-16: qty 1

## 2022-03-16 MED ORDER — PHENOL 1.4 % MT LIQD: 1.0000 | OROMUCOSAL | Status: DC | PRN

## 2022-03-16 MED ORDER — DEXAMETHASONE SODIUM PHOSPHATE 10 MG/ML IJ SOLN
INTRAMUSCULAR | Status: DC | PRN
  Administered 2022-03-16: 10 mg via INTRAVENOUS

## 2022-03-16 MED ORDER — VANCOMYCIN HCL 1500 MG/300ML IV SOLN
1500.0000 mg | Freq: Once | INTRAVENOUS | Status: AC
  Administered 2022-03-16: 1500 mg via INTRAVENOUS
  Filled 2022-03-16: qty 300

## 2022-03-16 MED ORDER — METOPROLOL SUCCINATE ER 100 MG PO TB24
100.0000 mg | ORAL_TABLET | Freq: Every day | ORAL | Status: DC
  Administered 2022-03-16: 100 mg via ORAL
  Filled 2022-03-16: qty 1

## 2022-03-16 MED ORDER — VITAMIN D 25 MCG (1000 UNIT) PO TABS
4000.0000 [IU] | ORAL_TABLET | Freq: Every day | ORAL | Status: DC
  Filled 2022-03-16: qty 4

## 2022-03-16 MED ORDER — PHENYLEPHRINE 80 MCG/ML (10ML) SYRINGE FOR IV PUSH (FOR BLOOD PRESSURE SUPPORT)
PREFILLED_SYRINGE | INTRAVENOUS | Status: AC
  Filled 2022-03-16: qty 10

## 2022-03-16 MED ORDER — PROPOFOL 10 MG/ML IV BOLUS
INTRAVENOUS | Status: AC
  Filled 2022-03-16: qty 20

## 2022-03-16 MED ORDER — ACETAMINOPHEN 650 MG RE SUPP: 650.0000 mg | RECTAL | Status: DC | PRN

## 2022-03-16 MED ORDER — ADULT MULTIVITAMIN W/MINERALS CH
1.0000 | ORAL_TABLET | Freq: Every day | ORAL | Status: DC
  Filled 2022-03-16: qty 1

## 2022-03-16 MED ORDER — 0.9 % SODIUM CHLORIDE (POUR BTL) OPTIME
TOPICAL | Status: DC | PRN
  Administered 2022-03-16: 1000 mL

## 2022-03-16 MED ORDER — LACTATED RINGERS IV SOLN: INTRAVENOUS | Status: DC | PRN

## 2022-03-16 MED ORDER — SODIUM CHLORIDE 0.9% FLUSH
3.0000 mL | Freq: Two times a day (BID) | INTRAVENOUS | Status: DC
  Administered 2022-03-16 (×2): 3 mL via INTRAVENOUS

## 2022-03-16 MED ORDER — CHLORHEXIDINE GLUCONATE CLOTH 2 % EX PADS: 6.0000 | MEDICATED_PAD | Freq: Once | CUTANEOUS | Status: DC

## 2022-03-16 MED ORDER — MONTELUKAST SODIUM 10 MG PO TABS
10.0000 mg | ORAL_TABLET | Freq: Every day | ORAL | Status: DC
  Administered 2022-03-16: 10 mg via ORAL
  Filled 2022-03-16: qty 1

## 2022-03-16 MED ORDER — FENTANYL CITRATE (PF) 250 MCG/5ML IJ SOLN
INTRAMUSCULAR | Status: DC | PRN
  Administered 2022-03-16 (×2): 50 ug via INTRAVENOUS
  Administered 2022-03-16: 100 ug via INTRAVENOUS
  Administered 2022-03-16: 50 ug via INTRAVENOUS

## 2022-03-16 MED ORDER — ACETAMINOPHEN 10 MG/ML IV SOLN
INTRAVENOUS | Status: DC | PRN
  Administered 2022-03-16: 1000 mg via INTRAVENOUS

## 2022-03-16 MED ORDER — DEXAMETHASONE SODIUM PHOSPHATE 10 MG/ML IJ SOLN
INTRAMUSCULAR | Status: AC
  Filled 2022-03-16: qty 1

## 2022-03-16 MED ORDER — PANTOPRAZOLE SODIUM 40 MG PO TBEC
40.0000 mg | DELAYED_RELEASE_TABLET | Freq: Every day | ORAL | Status: DC
  Administered 2022-03-17: 40 mg via ORAL
  Filled 2022-03-16: qty 1

## 2022-03-16 MED ORDER — LIDOCAINE 2% (20 MG/ML) 5 ML SYRINGE
INTRAMUSCULAR | Status: DC | PRN
  Administered 2022-03-16: 60 mg via INTRAVENOUS

## 2022-03-16 MED ORDER — PHENYLEPHRINE 80 MCG/ML (10ML) SYRINGE FOR IV PUSH (FOR BLOOD PRESSURE SUPPORT)
PREFILLED_SYRINGE | INTRAVENOUS | Status: DC | PRN
  Administered 2022-03-16: 160 ug via INTRAVENOUS
  Administered 2022-03-16 (×4): 80 ug via INTRAVENOUS

## 2022-03-16 MED ORDER — POLYVINYL ALCOHOL 1.4 % OP SOLN: 1.0000 [drp] | Freq: Three times a day (TID) | OPHTHALMIC | Status: DC | PRN

## 2022-03-16 MED ORDER — HYDROMORPHONE HCL 1 MG/ML IJ SOLN
1.0000 mg | INTRAMUSCULAR | Status: DC | PRN
  Administered 2022-03-16 (×3): 1 mg via INTRAVENOUS
  Filled 2022-03-16 (×2): qty 1

## 2022-03-16 MED ORDER — UMECLIDINIUM BROMIDE 62.5 MCG/ACT IN AEPB
1.0000 | INHALATION_SPRAY | Freq: Every day | RESPIRATORY_TRACT | Status: DC
  Filled 2022-03-16: qty 7

## 2022-03-16 MED ORDER — FLEET ENEMA 7-19 GM/118ML RE ENEM: 1.0000 | ENEMA | Freq: Once | RECTAL | Status: DC | PRN

## 2022-03-16 MED ORDER — KETAMINE HCL 50 MG/5ML IJ SOSY
PREFILLED_SYRINGE | INTRAMUSCULAR | Status: AC
  Filled 2022-03-16: qty 5

## 2022-03-16 MED ORDER — CHLORHEXIDINE GLUCONATE 0.12 % MT SOLN
15.0000 mL | Freq: Once | OROMUCOSAL | Status: AC
  Administered 2022-03-16: 15 mL via OROMUCOSAL
  Filled 2022-03-16: qty 15

## 2022-03-16 MED ORDER — FLUTICASONE PROPIONATE 50 MCG/ACT NA SUSP
2.0000 | Freq: Every day | NASAL | Status: DC
  Filled 2022-03-16: qty 16

## 2022-03-16 MED ORDER — ONDANSETRON HCL 4 MG/2ML IJ SOLN
INTRAMUSCULAR | Status: AC
  Filled 2022-03-16: qty 2

## 2022-03-16 MED ORDER — GUAIFENESIN ER 600 MG PO TB12
1200.0000 mg | ORAL_TABLET | Freq: Two times a day (BID) | ORAL | Status: DC
  Administered 2022-03-16: 1200 mg via ORAL
  Filled 2022-03-16 (×2): qty 2

## 2022-03-16 MED ORDER — PHENYLEPHRINE HCL-NACL 20-0.9 MG/250ML-% IV SOLN
INTRAVENOUS | Status: DC | PRN
  Administered 2022-03-16: 30 ug/min via INTRAVENOUS

## 2022-03-16 MED ORDER — OXYCODONE HCL 5 MG PO TABS
10.0000 mg | ORAL_TABLET | ORAL | Status: DC | PRN
  Administered 2022-03-16 – 2022-03-17 (×6): 10 mg via ORAL
  Filled 2022-03-16 (×6): qty 2

## 2022-03-16 MED ORDER — FENTANYL CITRATE (PF) 100 MCG/2ML IJ SOLN
INTRAMUSCULAR | Status: AC
  Filled 2022-03-16: qty 2

## 2022-03-16 MED ORDER — SODIUM CHLORIDE 0.9% FLUSH: 3.0000 mL | INTRAVENOUS | Status: DC | PRN

## 2022-03-16 MED ORDER — VANCOMYCIN HCL 1000 MG IV SOLR
INTRAVENOUS | Status: AC
  Filled 2022-03-16: qty 20

## 2022-03-16 MED ORDER — FINASTERIDE 5 MG PO TABS
5.0000 mg | ORAL_TABLET | Freq: Every day | ORAL | Status: DC
  Administered 2022-03-17: 5 mg via ORAL
  Filled 2022-03-16: qty 1

## 2022-03-16 MED ORDER — BUPIVACAINE HCL (PF) 0.25 % IJ SOLN
INTRAMUSCULAR | Status: AC
  Filled 2022-03-16: qty 30

## 2022-03-16 MED ORDER — POLYETHYLENE GLYCOL 3350 17 G PO PACK: 17.0000 g | PACK | Freq: Every day | ORAL | Status: DC | PRN

## 2022-03-16 MED ORDER — SPIRONOLACTONE 12.5 MG HALF TABLET
12.5000 mg | ORAL_TABLET | Freq: Every day | ORAL | Status: DC
  Administered 2022-03-16: 12.5 mg via ORAL
  Filled 2022-03-16: qty 1

## 2022-03-16 MED ORDER — KETAMINE HCL-SODIUM CHLORIDE 100-0.9 MG/10ML-% IV SOSY
PREFILLED_SYRINGE | INTRAVENOUS | Status: DC | PRN
  Administered 2022-03-16: 40 mg via INTRAVENOUS
  Administered 2022-03-16: 10 mg via INTRAVENOUS

## 2022-03-16 MED ORDER — DIAZEPAM 5 MG PO TABS
5.0000 mg | ORAL_TABLET | Freq: Four times a day (QID) | ORAL | Status: DC | PRN
  Administered 2022-03-16 – 2022-03-17 (×4): 5 mg via ORAL
  Filled 2022-03-16 (×4): qty 1

## 2022-03-16 MED ORDER — PROSIGHT PO TABS
1.0000 | ORAL_TABLET | Freq: Two times a day (BID) | ORAL | Status: DC
  Administered 2022-03-16: 1 via ORAL
  Filled 2022-03-16 (×2): qty 1

## 2022-03-16 MED ORDER — THROMBIN 20000 UNITS EX SOLR
CUTANEOUS | Status: DC | PRN
  Administered 2022-03-16: 20 mL via TOPICAL

## 2022-03-16 MED ORDER — SIMVASTATIN 20 MG PO TABS
20.0000 mg | ORAL_TABLET | Freq: Every day | ORAL | Status: DC
  Administered 2022-03-16: 20 mg via ORAL
  Filled 2022-03-16: qty 1

## 2022-03-16 MED ORDER — MENTHOL 3 MG MT LOZG: 1.0000 | LOZENGE | OROMUCOSAL | Status: DC | PRN

## 2022-03-16 MED ORDER — ACETAMINOPHEN 325 MG PO TABS
650.0000 mg | ORAL_TABLET | ORAL | Status: DC | PRN
  Administered 2022-03-16 – 2022-03-17 (×4): 650 mg via ORAL
  Filled 2022-03-16 (×4): qty 2

## 2022-03-16 MED ORDER — ONDANSETRON HCL 4 MG PO TABS: 4.0000 mg | ORAL_TABLET | Freq: Four times a day (QID) | ORAL | Status: DC | PRN

## 2022-03-16 MED ORDER — VITAMIN C 500 MG PO TABS
1000.0000 mg | ORAL_TABLET | Freq: Every day | ORAL | Status: DC
  Filled 2022-03-16: qty 2

## 2022-03-16 MED ORDER — GABAPENTIN 300 MG PO CAPS
300.0000 mg | ORAL_CAPSULE | Freq: Four times a day (QID) | ORAL | Status: DC
  Administered 2022-03-16 – 2022-03-17 (×4): 300 mg via ORAL
  Filled 2022-03-16 (×4): qty 1

## 2022-03-16 MED ORDER — ROCURONIUM BROMIDE 10 MG/ML (PF) SYRINGE
PREFILLED_SYRINGE | INTRAVENOUS | Status: AC
  Filled 2022-03-16: qty 10

## 2022-03-16 MED ORDER — MAGNESIUM HYDROXIDE 400 MG/5ML PO SUSP
15.0000 mL | Freq: Every day | ORAL | Status: DC
  Administered 2022-03-16: 15 mL via ORAL
  Filled 2022-03-16: qty 30

## 2022-03-16 MED ORDER — LATANOPROST 0.005 % OP SOLN
1.0000 [drp] | Freq: Every day | OPHTHALMIC | Status: DC
  Administered 2022-03-16: 1 [drp] via OPHTHALMIC
  Filled 2022-03-16: qty 2.5

## 2022-03-16 MED ORDER — LIDOCAINE 2% (20 MG/ML) 5 ML SYRINGE
INTRAMUSCULAR | Status: AC
  Filled 2022-03-16: qty 5

## 2022-03-16 MED ORDER — THROMBIN 20000 UNITS EX SOLR
CUTANEOUS | Status: AC
  Filled 2022-03-16: qty 20000

## 2022-03-16 MED ORDER — IRBESARTAN 150 MG PO TABS
150.0000 mg | ORAL_TABLET | Freq: Every day | ORAL | Status: DC
  Administered 2022-03-16 – 2022-03-17 (×2): 150 mg via ORAL
  Filled 2022-03-16 (×2): qty 1

## 2022-03-16 MED ORDER — CEFAZOLIN SODIUM-DEXTROSE 1-4 GM/50ML-% IV SOLN
1.0000 g | Freq: Three times a day (TID) | INTRAVENOUS | Status: AC
  Administered 2022-03-16 (×2): 1 g via INTRAVENOUS
  Filled 2022-03-16 (×2): qty 50

## 2022-03-16 MED ORDER — TAMSULOSIN HCL 0.4 MG PO CAPS
0.4000 mg | ORAL_CAPSULE | Freq: Two times a day (BID) | ORAL | Status: DC
  Administered 2022-03-16 – 2022-03-17 (×2): 0.4 mg via ORAL
  Filled 2022-03-16 (×2): qty 1

## 2022-03-16 MED ORDER — BISACODYL 10 MG RE SUPP: 10.0000 mg | Freq: Every day | RECTAL | Status: DC | PRN

## 2022-03-16 MED ORDER — HYDROMORPHONE HCL 1 MG/ML IJ SOLN
0.2500 mg | INTRAMUSCULAR | Status: DC | PRN
  Administered 2022-03-16 (×4): 0.5 mg via INTRAVENOUS

## 2022-03-16 MED ORDER — ONDANSETRON HCL 4 MG/2ML IJ SOLN: 4.0000 mg | Freq: Four times a day (QID) | INTRAMUSCULAR | Status: DC | PRN

## 2022-03-16 MED ORDER — FLUTICASONE FUROATE-VILANTEROL 200-25 MCG/ACT IN AEPB
1.0000 | INHALATION_SPRAY | Freq: Every day | RESPIRATORY_TRACT | Status: DC
  Filled 2022-03-16: qty 28

## 2022-03-16 MED ORDER — FENTANYL CITRATE (PF) 250 MCG/5ML IJ SOLN
INTRAMUSCULAR | Status: AC
  Filled 2022-03-16: qty 5

## 2022-03-16 MED ORDER — FUROSEMIDE 20 MG PO TABS
20.0000 mg | ORAL_TABLET | Freq: Every day | ORAL | Status: DC
  Administered 2022-03-16: 20 mg via ORAL
  Filled 2022-03-16: qty 1

## 2022-03-16 MED ORDER — SUGAMMADEX SODIUM 200 MG/2ML IV SOLN
INTRAVENOUS | Status: DC | PRN
  Administered 2022-03-16: 200 mg via INTRAVENOUS

## 2022-03-16 MED ORDER — CHLORHEXIDINE GLUCONATE CLOTH 2 % EX PADS
6.0000 | MEDICATED_PAD | Freq: Every day | CUTANEOUS | Status: DC
  Administered 2022-03-17: 6 via TOPICAL

## 2022-03-16 MED ORDER — ORAL CARE MOUTH RINSE: 15.0000 mL | Freq: Once | OROMUCOSAL | Status: AC

## 2022-03-16 MED ORDER — FENTANYL CITRATE (PF) 100 MCG/2ML IJ SOLN
25.0000 ug | INTRAMUSCULAR | Status: DC | PRN
  Administered 2022-03-16 (×3): 50 ug via INTRAVENOUS

## 2022-03-16 MED ORDER — SODIUM CHLORIDE 0.9 % IV SOLN
250.0000 mL | INTRAVENOUS | Status: DC
  Administered 2022-03-16: 250 mL via INTRAVENOUS

## 2022-03-16 MED ORDER — LACTATED RINGERS IV SOLN: INTRAVENOUS | Status: DC

## 2022-03-16 MED ORDER — CEFAZOLIN SODIUM-DEXTROSE 2-4 GM/100ML-% IV SOLN
2.0000 g | INTRAVENOUS | Status: AC
  Administered 2022-03-16: 2 g via INTRAVENOUS

## 2022-03-16 MED ORDER — SALINE SPRAY 0.65 % NA SOLN: 1.0000 | NASAL | Status: DC | PRN

## 2022-03-16 MED ORDER — IPRATROPIUM-ALBUTEROL 0.5-2.5 (3) MG/3ML IN SOLN: 3.0000 mL | Freq: Four times a day (QID) | RESPIRATORY_TRACT | Status: DC | PRN

## 2022-03-16 MED ORDER — HYDROCODONE-ACETAMINOPHEN 10-325 MG PO TABS: 1.0000 | ORAL_TABLET | ORAL | Status: DC | PRN

## 2022-03-16 MED ORDER — ACETAMINOPHEN 500 MG PO TABS: 1000.0000 mg | ORAL_TABLET | Freq: Once | ORAL | Status: DC

## 2022-03-16 MED ORDER — ZINC 50 MG PO TABS: 50.0000 mg | ORAL_TABLET | Freq: Every day | ORAL | Status: DC

## 2022-03-16 MED ORDER — HYDROMORPHONE HCL 1 MG/ML IJ SOLN
INTRAMUSCULAR | Status: DC | PRN
  Administered 2022-03-16: .5 mg via INTRAVENOUS

## 2022-03-16 MED ORDER — ONDANSETRON HCL 4 MG/2ML IJ SOLN
INTRAMUSCULAR | Status: DC | PRN
  Administered 2022-03-16: 4 mg via INTRAVENOUS

## 2022-03-16 SURGICAL SUPPLY — 78 items
ADH SKN CLS APL DERMABOND .7 (GAUZE/BANDAGES/DRESSINGS) ×1
ANCH SPNL 25 LMBR MIS (Anchor) ×3 IMPLANT
ANCHOR IMPACTOR GL DISP (ORTHOPEDIC DISPOSABLE SUPPLIES) IMPLANT
ANCHOR LUMBAR 25 MIS (Anchor) IMPLANT
APL SKNCLS STERI-STRIP NONHPOA (GAUZE/BANDAGES/DRESSINGS)
APPLIER CLIP 11 MED OPEN (CLIP) ×2
APR CLP MED 11 20 MLT OPN (CLIP) ×2
BAG COUNTER SPONGE SURGICOUNT (BAG) ×4 IMPLANT
BAG DECANTER FOR FLEXI CONT (MISCELLANEOUS) ×2 IMPLANT
BAG SPNG CNTER NS LX DISP (BAG) ×2
BENZOIN TINCTURE PRP APPL 2/3 (GAUZE/BANDAGES/DRESSINGS) IMPLANT
BUR MATCHSTICK NEURO 3.0 LAGG (BURR) IMPLANT
CANISTER SUCT 3000ML PPV (MISCELLANEOUS) ×2 IMPLANT
CLIP APPLIE 11 MED OPEN (CLIP) ×4 IMPLANT
CLIP LIGATING EXTRA MED SLVR (CLIP) IMPLANT
DERMABOND ADVANCED .7 DNX12 (GAUZE/BANDAGES/DRESSINGS) IMPLANT
DRAPE C-ARM 42X72 X-RAY (DRAPES) ×6 IMPLANT
DRAPE LAPAROTOMY 100X72X124 (DRAPES) ×2 IMPLANT
ELECT BLADE 4.0 EZ CLEAN MEGAD (MISCELLANEOUS) ×1
ELECT BLADE 6.5 EXT (BLADE) ×2 IMPLANT
ELECT REM PT RETURN 9FT ADLT (ELECTROSURGICAL) ×1
ELECTRODE BLDE 4.0 EZ CLN MEGD (MISCELLANEOUS) ×2 IMPLANT
ELECTRODE REM PT RTRN 9FT ADLT (ELECTROSURGICAL) ×2 IMPLANT
GAUZE 4X4 16PLY ~~LOC~~+RFID DBL (SPONGE) IMPLANT
GAUZE SPONGE 4X4 12PLY STRL (GAUZE/BANDAGES/DRESSINGS) ×2 IMPLANT
GLOVE BIO SURGEON STRL SZ7.5 (GLOVE) ×2 IMPLANT
GLOVE BIOGEL PI IND STRL 8 (GLOVE) ×2 IMPLANT
GLOVE ECLIPSE 9.0 STRL (GLOVE) ×2 IMPLANT
GLOVE EXAM NITRILE XL STR (GLOVE) IMPLANT
GOWN STRL REUS W/ TWL LRG LVL3 (GOWN DISPOSABLE) ×2 IMPLANT
GOWN STRL REUS W/ TWL XL LVL3 (GOWN DISPOSABLE) ×4 IMPLANT
GOWN STRL REUS W/TWL 2XL LVL3 (GOWN DISPOSABLE) IMPLANT
GOWN STRL REUS W/TWL LRG LVL3 (GOWN DISPOSABLE) ×1
GOWN STRL REUS W/TWL XL LVL3 (GOWN DISPOSABLE) ×2
GRAFT TRINITY ELITE LGE HUMAN (Tissue) IMPLANT
INSERT FOGARTY 61MM (MISCELLANEOUS) IMPLANT
INSERT FOGARTY SM (MISCELLANEOUS) IMPLANT
KIT BASIN OR (CUSTOM PROCEDURE TRAY) ×2 IMPLANT
KIT INFUSE XX SMALL 0.7CC (Orthopedic Implant) IMPLANT
NDL SPNL 18GX3.5 QUINCKE PK (NEEDLE) ×2 IMPLANT
NEEDLE HYPO 22GX1.5 SAFETY (NEEDLE) ×2 IMPLANT
NEEDLE SPNL 18GX3.5 QUINCKE PK (NEEDLE) ×1 IMPLANT
NS IRRIG 1000ML POUR BTL (IV SOLUTION) ×2 IMPLANT
PACK LAMINECTOMY NEURO (CUSTOM PROCEDURE TRAY) ×2 IMPLANT
PAD ARMBOARD 7.5X6 YLW CONV (MISCELLANEOUS) ×4 IMPLANT
SOL ELECTROSURG ANTI STICK (MISCELLANEOUS) ×1
SOLUTION ELECTROSURG ANTI STCK (MISCELLANEOUS) ×2 IMPLANT
SPACER HEDRON IA 29X39 15 15D (Spacer) IMPLANT
SPONGE INTESTINAL PEANUT (DISPOSABLE) ×6 IMPLANT
SPONGE SURGIFOAM ABS GEL 100 (HEMOSTASIS) ×4 IMPLANT
SPONGE T-LAP 18X18 ~~LOC~~+RFID (SPONGE) ×2 IMPLANT
STAPLER VISISTAT 35W (STAPLE) ×2 IMPLANT
STRIP CLOSURE SKIN 1/2X4 (GAUZE/BANDAGES/DRESSINGS) IMPLANT
SUT PDS AB 1 CTX 36 (SUTURE) IMPLANT
SUT PROLENE 4 0 RB 1 (SUTURE)
SUT PROLENE 4-0 RB1 .5 CRCL 36 (SUTURE) IMPLANT
SUT PROLENE 5 0 CC1 (SUTURE) IMPLANT
SUT PROLENE 6 0 C 1 30 (SUTURE) IMPLANT
SUT PROLENE 6 0 CC (SUTURE) IMPLANT
SUT SILK 0 TIES 10X30 (SUTURE) IMPLANT
SUT SILK 2 0 TIES 10X30 (SUTURE) ×2 IMPLANT
SUT SILK 2 0SH CR/8 30 (SUTURE) IMPLANT
SUT SILK 3 0 SH CR/8 (SUTURE) IMPLANT
SUT SILK 3 0 TIES 17X18 (SUTURE)
SUT SILK 3 0SH CR/8 30 (SUTURE) IMPLANT
SUT SILK 3-0 18XBRD TIE BLK (SUTURE) IMPLANT
SUT VIC AB 0 CT1 18XCR BRD8 (SUTURE) ×2 IMPLANT
SUT VIC AB 0 CT1 27 (SUTURE)
SUT VIC AB 0 CT1 27XBRD ANBCTR (SUTURE) ×4 IMPLANT
SUT VIC AB 0 CT1 27XBRD ANTBC (SUTURE) IMPLANT
SUT VIC AB 0 CT1 8-18 (SUTURE) ×1
SUT VIC AB 2-0 CT1 18 (SUTURE) ×2 IMPLANT
SUT VIC AB 3-0 SH 8-18 (SUTURE) ×2 IMPLANT
SUT VICRYL 4-0 PS2 18IN ABS (SUTURE) ×2 IMPLANT
TOWEL GREEN STERILE (TOWEL DISPOSABLE) ×4 IMPLANT
TOWEL GREEN STERILE FF (TOWEL DISPOSABLE) ×2 IMPLANT
TRAY FOLEY MTR SLVR 16FR STAT (SET/KITS/TRAYS/PACK) ×2 IMPLANT
WATER STERILE IRR 1000ML POUR (IV SOLUTION) ×2 IMPLANT

## 2022-03-16 NOTE — H&P (Signed)
History and Physical Interval Note:  03/16/2022 7:20 AM  Cody Tate  has presented today for surgery, with the diagnosis of Stenosis.  The various methods of treatment have been discussed with the patient and family. After consideration of risks, benefits and other options for treatment, the patient has consented to  Procedure(s): ALIF - L5-S1 with cage (N/A) ABDOMINAL EXPOSURE (N/A) as a surgical intervention.  The patient's history has been reviewed, patient examined, no change in status, stable for surgery.  I have reviewed the patient's chart and labs.  Questions were answered to the patient's satisfaction.     Marty Heck     [name]        MRN: [id number]        DOB: [birth year]            Sex: male   REASON FOR CONSULT: Evaluate for anterior spine exposure for L5-S1 ALIF   HPI: Cody Tate is a 72 y.o. male, with history COPD, hypertension, chronic back pain who presents for evaluation of anterior spine exposure for L5-S1 ALIF.  Patient states that his back pain has been ongoing for years.  States he has really unsteady balance and has a hard time walking.  Also having to use a walker or a cane.  Has had multiple previous posterior back surgeries with evidence of fusion from L1-L5.  Evaluated by Dr. 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  He has recommended an L5-S1 ALIF and asked vascular surgery to assist with anterior spine exposure.  Patient denies any previous abdominal surgeries.  He has had a spinal stimulator implanted in the back.       Past Medical History:  Diagnosis Date   Arthritis     Back pain      Radiates down both legs   BPH (benign prostatic hyperplasia)     Cancer (HCC)      prostate cancer   COPD (chronic obstructive pulmonary disease) (HCC)     Dyspnea     Enlarged prostate     GERD (gastroesophageal reflux disease)     Glaucoma     H/O hiatal  hernia     H/O wheezing      occ inhaler usage   Heart murmur     Hypertension     Lumbar foraminal stenosis     Macular degeneration     Meningioma (HCC)     PONV (postoperative nausea and vomiting)     Pulmonary nodules      resolved on 2019 follow up CT   S/P insertion of spinal cord stimulator             Past Surgical History:  Procedure Laterality Date   BACK SURGERY   06,07,08    lam x3 cerv x 1   CERVICAL FUSION       CYSTOSCOPY        72 yrs old   CYSTOSCOPY WITH URETHRAL DILATATION N/A 09/21/2021    Procedure: CYSTOSCOPY WITH BALLOON DILATION and OPTILUME BALLOON DILATION OF  URETHRAL DILATATION;  Surgeon: Robley Fries, MD;  [facility];  Service: Urology;  Laterality: N/A;   EYE SURGERY        bil   GOLD SEED IMPLANT N/A 09/06/2020    Procedure: GOLD SEED IMPLANT/ PLACEMENT OF FIDUCIAL MARKERS;  Surgeon: Robley Fries, MD;  [facility];  Service: Urology;  Laterality:  N/A;   KNEE ARTHROSCOPY   10    lft   KNEE ARTHROSCOPY Left 07/23/2012    Procedure: LEFT MEDIAL MENISCAL DEBRIDEMENT AND CHONDROPLASTY;  Surgeon: [name], MD;  [facility]: [facility];  Service: Orthopedics;  Laterality: Left;   LUMBAR FUSION   02/22/2014    DR POOL   PILONIDAL CYST EXCISION       SINUS EXPLORATION       SPACE OAR INSTILLATION N/A 09/06/2020    Procedure: SPACE OAR INSTILLATION;  Surgeon: Robley Fries, MD;  [facility]: [facility];  Service: Urology;  Laterality: N/A;   SPINAL CORD STIMULATOR INSERTION N/A 01/16/2018    Procedure: LUMBAR SPINAL CORD STIMULATOR INSERTION;  Surgeon: [name], MD;  Location: [facility];  Service: Neurosurgery;  Laterality: N/A;  LUMBAR SPINAL CORD STIMULATOR INSERTION   TRANSURETHRAL RESECTION OF PROSTATE N/A 09/06/2020    Procedure: TRANSURETHRAL RESECTION OF THE PROSTATE (TURP);  Surgeon: Robley Fries, MD;  [facility];  Service: Urology;  Laterality: N/A;  2 HRS   WRIST GANGLION EXCISION        rt   WRIST SURGERY   09    cyst  lft           Family History  Problem Relation Age of Onset   COPD Maternal Uncle        SOCIAL HISTORY: Social History         Socioeconomic History   Marital status: Married      Spouse name: Not on file   Number of children: Not on file   Years of education: Not on file   Highest education level: Not on file  Occupational History   Not on file  Tobacco Use   Smoking status: Former      Packs/day: 1.50      Years: 35.00      Total pack years: 52.50      Types: Cigarettes      Quit date: 05/03/2000      Years since quitting: 21.8   Smokeless tobacco: Never   Tobacco comments:      quit alcohol 73  Vaping Use   Vaping Use: Never used  Substance and Sexual Activity   Alcohol use: Not Currently      Comment: hx of years ago   Drug use: No   Sexual activity: Not Currently  Other Topics Concern   Not on file  Social History Narrative    08-20-17 Unable to ask abuse questions wife with him today.    Social Determinants of Health    Financial Resource Strain: Not on file  Food Insecurity: Not on file  Transportation Needs: Not on file  Physical Activity: Not on file  Stress: Not on file  Social Connections: Not on file  Intimate Partner Violence: Not on file           Allergies  Allergen Reactions   Other Itching, Rash and Other (See Comments)      VICRYL Sutures   Tape Itching, Other (See Comments) and Rash      Occlusive tape and bandaids            Current Outpatient Medications  Medication Sig Dispense Refill   Ascorbic Acid (VITAMIN C) 1000 MG tablet Take 1,000 mg by mouth daily.       chlorhexidine (HIBICLENS) 4 % external liquid Apply 1 Application topically See admin instructions. Apply daily for the first week of every month  Cholecalciferol (VITAMIN D3) 50 MCG (2000 UT) capsule Take 4,000 Units by mouth daily.       diphenhydrAMINE (BENADRYL ALLERGY) 25 MG tablet         finasteride (PROSCAR) 5 MG tablet Take 5 mg by mouth daily.        fluticasone (FLONASE) 50 MCG/ACT nasal spray Place 2 sprays into both nostrils daily.        Fluticasone-Umeclidin-Vilant (TRELEGY ELLIPTA) 200-62.5-25 MCG/ACT AEPB USE 1 INHALATION BY MOUTH ONCE  DAILY AT THE SAME TIME EACH DAY 180 each 0   furosemide (LASIX) 20 MG tablet Take 1 tablet by mouth once daily 90 tablet 1   gabapentin (NEURONTIN) 300 MG capsule Take 300 mg by mouth 6 (six) times daily.       Guaifenesin (MUCINEX MAXIMUM STRENGTH) 1200 MG TB12 Take 1,200 mg by mouth 2 (two) times daily.       hydrochlorothiazide (HYDRODIURIL) 25 MG tablet Take 25 mg by mouth daily.       ibuprofen (ADVIL,MOTRIN) 200 MG tablet Take 400 mg by mouth 4 (four) times daily as needed for moderate pain.       Ipratropium-Albuterol (COMBIVENT RESPIMAT) 20-100 MCG/ACT AERS respimat Inhale 1 puff into the lungs every 6 (six) hours as needed for wheezing or shortness of breath. 12 g 3   latanoprost (XALATAN) 0.005 % ophthalmic solution Place 1 drop into both eyes at bedtime.        magnesium hydroxide (MILK OF MAGNESIA) 400 MG/5ML suspension Take 15 mLs by mouth at bedtime.       metoprolol succinate (TOPROL-XL) 100 MG 24 hr tablet Take 100 mg by mouth at bedtime. Take with or immediately following a meal.       montelukast (SINGULAIR) 10 MG tablet TAKE 1 TABLET BY MOUTH AT  BEDTIME 90 tablet 3   Multiple Vitamin (MULTIVITAMIN WITH MINERALS) TABS tablet Take 1 tablet by mouth daily.       Multiple Vitamins-Minerals (PRESERVISION AREDS 2+MULTI VIT) CAPS Take 1 capsule by mouth in the morning and at bedtime.       omeprazole (PRILOSEC) 40 MG capsule Take 1 capsule (40 mg total) by mouth daily before breakfast. 90 capsule 3   Oxycodone HCl 10 MG TABS Take 10 mg by mouth every 4 (four) hours as needed (pain).       oxymetazoline (AFRIN) 0.05 % nasal spray Place 2 sprays into both nostrils 2 (two) times daily as needed for congestion.       Propylene Glycol, PF, (SYSTANE COMPLETE PF) 0.6 % SOLN Place 1 drop into both  eyes 3 (three) times daily as needed (dry eyes).       pseudoephedrine (SUDAFED) 30 MG tablet Take 60 mg by mouth every 6 (six) hours as needed for congestion.       Simethicone 125 MG CAPS Take 250 mg by mouth 4 (four) times daily as needed (for gas).       simvastatin (ZOCOR) 20 MG tablet Take 20 mg by mouth at bedtime.        sodium chloride (OCEAN) 0.65 % SOLN nasal spray Place 1 spray into both nostrils as needed for congestion. As needed       spironolactone (ALDACTONE) 25 MG tablet Take 12.5 mg by mouth at bedtime.        Tamsulosin HCl (FLOMAX) 0.4 MG CAPS Take 0.4 mg by mouth 2 (two) times daily.       telmisartan (MICARDIS) 40 MG tablet Take 40 mg by  mouth in the morning and at bedtime.       aspirin EC 325 MG tablet Take 325 mg by mouth daily.       diazepam (VALIUM) 2 MG tablet Take 2 mg by mouth.       Ferrous Sulfate (IRON PO) Take by mouth daily.       ramipril (ALTACE) 10 MG capsule Take 10 mg by mouth daily.       simethicone (MYLICON) 0000000 MG chewable tablet Chew 125 mg by mouth.        No current facility-administered medications for this visit.      REVIEW OF SYSTEMS:  [X]  denotes positive finding, [ ]  denotes negative finding Cardiac   Comments:  Chest pain or chest pressure:      Shortness of breath upon exertion:      Short of breath when lying flat:      Irregular heart rhythm:             Vascular      Pain in calf, thigh, or hip brought on by ambulation:      Pain in feet at night that wakes you up from your sleep:       Blood clot in your veins:      Leg swelling:              Pulmonary      Oxygen at home:      Productive cough:       Wheezing:              Neurologic      Sudden weakness in arms or legs:       Sudden numbness in arms or legs:       Sudden onset of difficulty speaking or slurred speech:      Temporary loss of vision in one eye:       Problems with dizziness:              Gastrointestinal      Blood in stool:       Vomited blood:               Genitourinary      Burning when urinating:       Blood in urine:             Psychiatric      Major depression:              Hematologic      Bleeding problems:      Problems with blood clotting too easily:             Skin      Rashes or ulcers:             Constitutional      Fever or chills:          PHYSICAL EXAM:    Vitals:    03/06/22 1521  BP: (!) 162/81  Pulse: 64  Resp: 18  Temp: 97.8 F (36.6 C)  TempSrc: Temporal  SpO2: 94%  Weight: 215 lb (97.5 kg)  Height: 5' 6.5" (1.689 m)      GENERAL: The patient is a well-nourished male, in no acute distress. The vital signs are documented above. CARDIAC: There is a regular rate and rhythm.  VASCULAR:  Bilateral femoral pulses palpable Bilateral DP pulses palpable PULMONARY: No respiratory distress. ABDOMEN: Soft and non-tender. MUSCULOSKELETAL: There are no major deformities or cyanosis. NEUROLOGIC: No focal  weakness or paresthesias are detected. SKIN: There are no ulcers or rashes noted. PSYCHIATRIC: The patient has a normal affect.   DATA:    CT Lumbar Spine reviewed       Assessment/Plan:   72 y.o. male, with history COPD, hypertension, chronic back pain who presents for evaluation of anterior spine exposure for L5-S1 ALIF.  I have reviewed his recent CT scan and discussed I think he should be a good candidate for anterior approach.  I discussed transverse incision over the left rectus muscle and mobilizing the left rectus to the midline and entering into the retroperitoneum and mobilizing peritoneum and left ureter across midline.  Discussed mobilizing iliac artery and vein.  Discussed risk of injury to the above structures.  Look forward to assisting Dr. Annette Stable.  All questions answered.  Of note he has had a lot of posterior surgery with prior decompression and fusion from L1-L5 and spinal stimulator as well.     Marty Heck, MD [facility] Office:  [phone]

## 2022-03-16 NOTE — Op Note (Signed)
Date: March 16, 2022  Preoperative diagnosis: Chronic lower back pain  Postoperative diagnosis: Same  Procedure: Anterior spine exposure via anterior retroperitoneal approach for L5-S1 ALIF  Surgeon: Dr. Marty Heck, MD  Co-surgeon: Dr. [name], MD  Indications: 72 year old male with chronic lower back pain.  He has had multiple previous posterior back surgeries.  Dr. 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  Vascular surgery was asked to assist with anterior spine exposure.  He presents after risks benefits discussed.  Findings: The L5-S1 disc space was marked over the left rectus muscle with a fluoroscopic C arm in the lateral position.  Transverse incision was made here and we opened the anterior rectus sheath and mobilized the left rectus muscle to the midline.  Entered into the retroperitoneum mobilized peritoneum and left ureter across midline.  Middle sacral vessels were divided between clips and ties and divided.  Mobilized on both sides of the disc space including mobilizing the left iliac vein lateral.  Fixed Nuvasive retractor was placed and confirmed we were at the correct level with a spinal needle in the disc space.  Anesthesia: General  Details: Patient was taken to the operating room after informed consent was obtained.  Placed on the operative table in supine position.  General endotracheal anesthesia was induced.  Fluoroscopic C-arm was then used in the lateral position to mark the L5-S1 disc space.  This was marked over the left rectus muscle.  The abdominal wall was then prepped and draped in standard sterile fashion.  Antibiotics were given and timeout performed.  Initially made a transverse incision over our preoperative mark.  Dissected down through the subcutaneous tissue with Bovie cautery.  Cerebellar retractors were used to help and visualized the anterior rectus sheath that was opened  transversely with Bovie cautery raised flaps underneath the antioer rectus sheath.  I went lateral to the muscle and mobilized the muscle to the midline and entered into the retroperitoneum and mobilized peritoneum and left ureter across midline with blunt dissection.  The left psoas and left iliac vessels were visualized.  Then mobilized at the L5-S1 disc space and was able to get a right angle around the middle sacral vessels that were divided between clips and ties.  I then bluntly dissected on both sides of the disc space with suction and Kd we had good working room including mobilizing the left iliac vein lateral.  This was quite scarred in from previous posterior back surgery.  Ultimately fixed NuVasive retractor was placed on the field.  A 180 reverse lip was placed on each side of the disc space with a 160 reverse lip cranial caudal.  We placed a needle in the disc space and confirmed we were at the correct L5-S1 level on lateral fluoroscopy.  Case was turned over to Dr. Annette Stable.  Complication: None  Condition: Stable  Marty Heck, MD [facility] Office: [address]

## 2022-03-16 NOTE — Anesthesia Postprocedure Evaluation (Signed)
Anesthesia Post Note  Patient: Cody Tate  Procedure(s) Performed: Anterior Lumbar Interbody Fusion - Lumbar Five-Sacral One with cage ABDOMINAL EXPOSURE     Patient location during evaluation: PACU Anesthesia Type: General Level of consciousness: awake Pain management: pain level controlled Vital Signs Assessment: post-procedure vital signs reviewed and stable Respiratory status: spontaneous breathing, nonlabored ventilation and respiratory function stable Cardiovascular status: blood pressure returned to baseline and stable Postop Assessment: no apparent nausea or vomiting Anesthetic complications: no   No notable events documented.  Last Vitals:  Vitals:   03/16/22 1150 03/16/22 1606  BP: (!) 155/72 (!) 157/79  Pulse: 89 93  Resp: 18 16  Temp: 36.6 C 36.7 C  SpO2: 95% 99%    Last Pain:  Vitals:   03/16/22 1606  TempSrc: Oral  PainSc:                  Strother Everitt P Teagen Bucio

## 2022-03-16 NOTE — Anesthesia Procedure Notes (Signed)
Procedure Name: Intubation Date/Time: 03/16/2022 7:44 AM  Performed by: Dorann Lodge, CRNAPre-anesthesia Checklist: Patient identified, Emergency Drugs available, Suction available and Patient being monitored Patient Re-evaluated:Patient Re-evaluated prior to induction Oxygen Delivery Method: Circle System Utilized Preoxygenation: Pre-oxygenation with 100% oxygen Induction Type: IV induction Ventilation: Mask ventilation without difficulty Laryngoscope Size: 4 and Mac Grade View: Grade I Tube type: Oral Tube size: 7.5 mm Number of attempts: 1 Airway Equipment and Method: Stylet Placement Confirmation: ETT inserted through vocal cords under direct vision, positive ETCO2 and breath sounds checked- equal and bilateral Secured at: 22 cm Tube secured with: Tape Dental Injury: Teeth and Oropharynx as per pre-operative assessment

## 2022-03-16 NOTE — Brief Op Note (Signed)
03/16/2022  9:53 AM  PATIENT:  [name]  72 y.o. male  PRE-OPERATIVE DIAGNOSIS:  Stenosis  POST-OPERATIVE DIAGNOSIS:  Stenosis  PROCEDURE:  Procedure(s): Anterior Lumbar Interbody Fusion - Lumbar Five-Sacral One with cage (N/A) ABDOMINAL EXPOSURE (N/A)  SURGEON:  Surgeon(s) and Role: Panel 1:    [name], MD - Primary Panel 2:    Marty Heck, MD - Primary  PHYSICIAN ASSISTANT:   ASSISTANTSMearl Latin   ANESTHESIA:   general  EBL:  75 mL   BLOOD ADMINISTERED:none  DRAINS: none   LOCAL MEDICATIONS USED:  MARCAINE     SPECIMEN:  No Specimen  DISPOSITION OF SPECIMEN:  N/A  COUNTS:  YES  TOURNIQUET:  * No tourniquets in log *  DICTATION: .Dragon Dictation  PLAN OF CARE: Admit to inpatient   PATIENT DISPOSITION:  PACU - hemodynamically stable.   Delay start of Pharmacological VTE agent (>24hrs) due to surgical blood loss or risk of bleeding: yes

## 2022-03-16 NOTE — Op Note (Signed)
Date of procedure: 03/16/2022  Date of dictation: Same  Service: Neurosurgery  Preoperative diagnosis: L5-S1 degenerative disc disease with degenerative retrolisthesis and severe foraminal stenosis.  Postoperative diagnosis: Same  Procedure Name: L5-S1 anterior lumbar interbody fusion utilizing interbody cage, morselized allograft, bone morphogenic protein, and anterior plate fixation.  Surgeon:Cedrik Heindl A.Temara Lanum, M.D.  Asst. Surgeon: Reinaldo Meeker, NP  Vascular Surgeon: Carlis Abbott MD  Anesthesia: General  Indication: 72 year old male remotely status post L1-L5 fusion presents with worsening back pain and progressive kyphosis.  Workup demonstrates evidence of severe disc degeneration with disc space collapse and severe degenerative retrolisthesis and foraminal stenosis at L5-S1.  Patient presents now for L5-S1 anterior lumbar to body fusion in hopes of improving his symptoms.  Operative note: After induction of anesthesia, patient positioned supine with arms outstretched.  Patient's lower abdominal region was prepped and draped sterilely.  Dr. Carlis Abbott of the vascular surgery surgeon then performed anterior approach.  Retractor was placed.  X-ray was taken.  Level confirmed.  Disc base incised.  Discectomy performed using various instruments.  Endplates cleaned of soft tissue.  Posterior release performed using Kerrison rongeurs to remove the posterior annulus and elements of the posterior logical ligament.  This allowed for mobilize the disc space.  The lateral foramen were decompressed the best I could.  The disc base was sequentially dilated/distracted.  A 15 mm x 15 degree implant large size was found to be most appropriate.  This was packed with Trinity allograft product and a small bone radiating protein soaked sponge.  This was then impacted in the place.  This is confirmed to be in good position both AP and lateral planes.  Locking stays were then impacted in the body of L5 and S1.  Locking screws were  engaged.  Final images also revealed good position of the cages and the hardware at the proper operative level with improved alignment of the spine.  Retractor system was removed.  Hemostasis was excellent.  Anterior rectus fascia was reapproximated using a running #1 PDS.  Skin was reapproximated Vicryl sutures in layers.  Steri-Strips and sterile dressing were applied.  No apparent complications.  Patient tolerated the procedure well and he returns to the recovery room postop

## 2022-03-16 NOTE — Transfer of Care (Signed)
Immediate Anesthesia Transfer of Care Note  Patient: Cody Tate  Procedure(s) Performed: Anterior Lumbar Interbody Fusion - Lumbar Five-Sacral One with cage ABDOMINAL EXPOSURE  Patient Location: PACU  Anesthesia Type:General  Level of Consciousness: awake and alert   Airway & Oxygen Therapy: Patient Spontanous Breathing  Post-op Assessment: Report given to RN and Post -op Vital signs reviewed and stable  Post vital signs: Reviewed and stable  Last Vitals:  Vitals Value Taken Time  BP 155/57 03/16/22 1010  Temp    Pulse 94 03/16/22 1011  Resp 19 03/16/22 1011  SpO2 90 % 03/16/22 1011  Vitals shown include unvalidated device data.  Last Pain:  Vitals:   03/16/22 0617  TempSrc:   PainSc: 4       Patients Stated Pain Goal: 3 ([id number])  Complications: No notable events documented.

## 2022-03-16 NOTE — H&P (Signed)
Cody Tate is an 72 y.o. male.   Chief Complaint: Back pain HPI: 72 year old male status post L1-L5 decompression and fusion sequentially over the years.  Patient with intractable back pain with kyphotic posterior with radicular pain extending down the posterior aspect of both lower extremities consistent with an L5 and S1 radicular pattern.  Workup demonstrates evidence of progressive degeneration with disc base collapse and anterior angulation at L5-S1 with severe foraminal stenosis.  Patient presents now for L5-S1 anterior lumbar interbody fusion utilizing interbody cage, l morselized allograft and bone ridging protein with anterior fixation in hopes of improving his symptoms.   Past Medical History:  Diagnosis Date   Arthritis    Back pain    Radiates down both legs   BPH (benign prostatic hyperplasia)    Cancer ([place]) 2022   prostate cancer   COPD (chronic obstructive pulmonary disease) (HCC)    Dyspnea    Enlarged prostate    GERD (gastroesophageal reflux disease)    Glaucoma    H/O hiatal hernia    H/O wheezing    occ inhaler usage   Heart murmur    Hypertension    Lumbar foraminal stenosis    Macular degeneration    Meningioma ([place]) 2014   Treated with radiation   Neuromuscular disorder (HCC)    Neuropathy bilateral legs   PONV (postoperative nausea and vomiting)    Pulmonary nodules    resolved on 2019 follow up CT   S/P insertion of spinal cord stimulator     Past Surgical History:  Procedure Laterality Date   BACK SURGERY  06/08/2006   lam x3 cerv x 1   CERVICAL FUSION     COLONOSCOPY  2020   CYSTOSCOPY     72 yrs old   CYSTOSCOPY WITH URETHRAL DILATATION N/A 09/21/2021   Procedure: CYSTOSCOPY WITH BALLOON DILATION and OPTILUME BALLOON DILATION OF  URETHRAL DILATATION;  Surgeon: Robley Fries, MD;  Location: [facility];  Service: Urology;  Laterality: N/A;   EYE SURGERY     bil   GOLD SEED IMPLANT N/A 09/06/2020   Procedure: GOLD SEED IMPLANT/ PLACEMENT  OF FIDUCIAL MARKERS;  Surgeon: Robley Fries, MD;  Location: [facility];  Service: Urology;  Laterality: N/A;   KNEE ARTHROSCOPY  01/02/2008   lft   KNEE ARTHROSCOPY Left 07/23/2012   Procedure: LEFT MEDIAL MENISCAL DEBRIDEMENT AND CHONDROPLASTY;  Surgeon: [name], MD;  Location: [facility];  Service: Orthopedics;  Laterality: Left;   LUMBAR FUSION  02/22/2014   DR Yi Falletta   PILONIDAL CYST EXCISION     SINUS EXPLORATION     SPACE OAR INSTILLATION N/A 09/06/2020   Procedure: SPACE OAR INSTILLATION;  Surgeon: Robley Fries, MD;  Location: [facility];  Service: Urology;  Laterality: N/A;   SPINAL CORD STIMULATOR INSERTION N/A 01/16/2018   Procedure: LUMBAR SPINAL CORD STIMULATOR INSERTION;  Surgeon: [name], MD;  Location: [facility];  Service: Neurosurgery;  Laterality: N/A;  LUMBAR SPINAL CORD STIMULATOR INSERTION   TRANSURETHRAL RESECTION OF PROSTATE N/A 09/06/2020   Procedure: TRANSURETHRAL RESECTION OF THE PROSTATE (TURP);  Surgeon: Robley Fries, MD;  Location: [facility];  Service: Urology;  Laterality: N/A;  2 HRS   WRIST GANGLION EXCISION     rt   WRIST SURGERY  01/02/2007   cyst lft    Family History  Problem Relation Age of Onset   COPD Maternal Uncle    Social History:  reports that he quit smoking about  21 years ago. His smoking use included cigarettes. He has a 52.50 pack-year smoking history. He has never used smokeless tobacco. He reports that he does not currently use alcohol. He reports that he does not use drugs.  Allergies:  Allergies  Allergen Reactions   Other Itching, Rash and Other (See Comments)    VICRYL Sutures   Tape Itching, Other (See Comments) and Rash    Occlusive tape and bandaids    Medications Prior to Admission  Medication Sig Dispense Refill   Ascorbic Acid (VITAMIN C) 1000 MG tablet Take 1,000 mg by mouth daily.     Cholecalciferol (VITAMIN D3) 50 MCG (2000 UT) capsule Take 4,000 Units by mouth daily.     diphenhydrAMINE (BENADRYL ALLERGY)  25 MG tablet Take 25 mg by mouth at bedtime.     finasteride (PROSCAR) 5 MG tablet Take 5 mg by mouth daily.     fluticasone (FLONASE) 50 MCG/ACT nasal spray Place 2 sprays into both nostrils daily.      Fluticasone-Umeclidin-Vilant (TRELEGY ELLIPTA) 200-62.5-25 MCG/ACT AEPB USE 1 INHALATION BY MOUTH ONCE  DAILY AT THE SAME TIME EACH DAY 180 each 0   furosemide (LASIX) 20 MG tablet Take 1 tablet by mouth once daily 90 tablet 1   gabapentin (NEURONTIN) 300 MG capsule Take 300 mg by mouth 4 (four) times daily.     Guaifenesin (MUCINEX MAXIMUM STRENGTH) 1200 MG TB12 Take 1,200 mg by mouth 2 (two) times daily.     ibuprofen (ADVIL,MOTRIN) 200 MG tablet Take 400 mg by mouth 4 (four) times daily as needed for moderate pain.     Ipratropium-Albuterol (COMBIVENT RESPIMAT) 20-100 MCG/ACT AERS respimat Inhale 1 puff into the lungs every 6 (six) hours as needed for wheezing or shortness of breath. 12 g 3   latanoprost (XALATAN) 0.005 % ophthalmic solution Place 1 drop into both eyes at bedtime.      magnesium hydroxide (MILK OF MAGNESIA) 400 MG/5ML suspension Take 15 mLs by mouth at bedtime.     metoprolol succinate (TOPROL-XL) 100 MG 24 hr tablet Take 100 mg by mouth at bedtime. Take with or immediately following a meal.     montelukast (SINGULAIR) 10 MG tablet TAKE 1 TABLET BY MOUTH AT  BEDTIME 90 tablet 3   Multiple Vitamin (MULTIVITAMIN WITH MINERALS) TABS tablet Take 1 tablet by mouth daily.     Multiple Vitamins-Minerals (PRESERVISION AREDS 2+MULTI VIT) CAPS Take 1 capsule by mouth in the morning and at bedtime.     omeprazole (PRILOSEC) 40 MG capsule Take 1 capsule (40 mg total) by mouth daily before breakfast. 90 capsule 3   Oxycodone HCl 10 MG TABS Take 10 mg by mouth every 4 (four) hours as needed (pain).     oxymetazoline (AFRIN) 0.05 % nasal spray Place 2 sprays into both nostrils 2 (two) times daily as needed for congestion.     Propylene Glycol, PF, (SYSTANE COMPLETE PF) 0.6 % SOLN Place 1 drop  into both eyes 3 (three) times daily as needed (dry eyes).     pseudoephedrine (SUDAFED) 30 MG tablet Take 60 mg by mouth 3 (three) times daily.     Simethicone 125 MG CAPS Take 250 mg by mouth 4 (four) times daily as needed (for gas).     simvastatin (ZOCOR) 20 MG tablet Take 20 mg by mouth at bedtime.      sodium chloride (OCEAN) 0.65 % SOLN nasal spray Place 1 spray into both nostrils as needed for congestion. As needed  spironolactone (ALDACTONE) 25 MG tablet Take 12.5 mg by mouth at bedtime.      Tamsulosin HCl (FLOMAX) 0.4 MG CAPS Take 0.4 mg by mouth 2 (two) times daily.     telmisartan (MICARDIS) 40 MG tablet Take 40 mg by mouth in the morning and at bedtime.     Zinc 50 MG TABS Take 50 mg by mouth daily.     chlorhexidine (HIBICLENS) 4 % external liquid Apply 1 Application topically daily as needed (boils).      No results found for this or any previous visit (from the past 48 hour(s)). No results found.  Pertinent items noted in HPI and remainder of comprehensive ROS otherwise negative.  Blood pressure (!) 153/62, pulse 67, temperature 99.3 F (37.4 C), temperature source Oral, resp. rate 16, height 5\' 6"  (1.676 m), weight 98.4 kg, SpO2 94 %.  Patient is awake and alert.  He is oriented and appropriate.  Speech is fluent.  Judgment insight are intact.  Cranial nerve function with some chronic amblyopia and gaze disconjugate and but otherwise intact.  Examination with some mild dorsiflexion weakness bilateral.  Sensory examination with decrease sensation to pinprick and light touch in his L5 and S1 dermatomes bilaterally.  Gait is antalgic.  Posture is flexed.  Examination head ears eyes nose throat is unremarked.  Chest and abdomen are benign.  Extremities are free of major deformity.  Exam Assessment/Plan L5-S1 degenerative disc disease with kyphotic angulation and severe neuroforaminal stenosis.  Plan L5-S1 anterior lumbar to body fusion utilizing interbody cage, lysed allograft,  and bone ridging protein with anterior plate fixation.  Risks and benefits been explained.  Patient wishes to proceed.  Mallie Mussel A Radin Raptis 03/16/2022, 7:18 AM

## 2022-03-17 MED ORDER — METHOCARBAMOL 750 MG PO TABS: 750.0000 mg | ORAL_TABLET | Freq: Three times a day (TID) | ORAL | 0 refills | Status: DC | PRN

## 2022-03-17 NOTE — Discharge Instructions (Signed)
Wound Care Keep incision covered and dry for three days. Do not put any creams, lotions, or ointments on incision. Leave steri-strips on back.  They will fall off by themselves. Activity Walk each and every day, increasing distance each day. No lifting greater than 8 lbs.  Avoid excessive neck motion. No driving for 2 weeks; may ride as a passenger locally. If provided with back brace, wear when out of bed.  It is not necessary to wear brace in bed. Diet Resume your normal diet.   Call Your Doctor If Any of These Occur Redness, drainage, or swelling at the wound.  Temperature greater than 101 degrees. Severe pain not relieved by pain medication. Incision starts to come apart. Follow Up Appt Call  [phone])  for problems.  If you have any hardware placed in your spine, you will need an x-ray before your appointment.

## 2022-03-17 NOTE — Progress Notes (Signed)
  Progress Note    03/17/2022 7:56 AM 1 Day Post-Op  Subjective: No real complaints this morning  Vitals:   03/17/22 0352 03/17/22 0725  BP: 125/69 (!) 149/71  Pulse: 76 63  Resp: 18 18  Temp: 97.9 F (36.6 C) 97.7 F (36.5 C)  SpO2: 100% 98%    Physical Exam: Awake alert and oriented Left leg is warm well-perfused with minimal edema Left lower quadrant incision intact with Dermabond  CBC    Component Value Date/Time   WBC 18.7 (H) 03/12/2022 1349   RBC 4.81 03/12/2022 1349   HGB 13.3 03/12/2022 1349   HCT 40.0 03/12/2022 1349   PLT 259 03/12/2022 1349   MCV 83.2 03/12/2022 1349   MCH 27.7 03/12/2022 1349   MCHC 33.3 03/12/2022 1349   RDW 14.2 03/12/2022 1349   LYMPHSABS 1.3 02/12/2014 1313   MONOABS 0.7 02/12/2014 1313   EOSABS 0.1 02/12/2014 1313   BASOSABS 0.1 02/12/2014 1313    BMET    Component Value Date/Time   NA 135 03/12/2022 1349   NA 136 01/29/2020 1430   K 4.0 03/12/2022 1349   CL 100 03/12/2022 1349   CO2 20 (L) 03/12/2022 1349   GLUCOSE 113 (H) 03/12/2022 1349   BUN 21 03/12/2022 1349   BUN 17 01/29/2020 1430   BUN 22.8 08/05/2014 1203   CREATININE 1.09 03/12/2022 1349   CREATININE 1.0 08/05/2014 1203   CALCIUM 8.8 (L) 03/12/2022 1349   GFRNONAA >60 03/12/2022 1349   GFRAA 100 01/29/2020 1430    INR No results found for: "INR"   Intake/Output Summary (Last 24 hours) at 03/17/2022 0756 Last data filed at 03/16/2022 1630 Gross per 24 hour  Intake 1540 ml  Output 300 ml  Net 1240 ml     Assessment:  72 y.o. male is s/p vascular surgery exposure for L5 5-S1 ALIF 1 Day Post-Op  Plan: Okay for discharge from vascular standpoint   Makayela Secrest C. Donzetta Matters, MD [facility] Office: [phone] Pager: [phone]  03/17/2022 7:56 AM

## 2022-03-17 NOTE — Discharge Summary (Signed)
Physician Discharge Summary  Patient ID: Cody Tate MRN: [id number] DOB/AGE: [birth year] 72 y.o. Estimated body mass index is 35.02 kg/m as calculated from the following:   Height as of this encounter: 5\' 6"  (1.676 m).   Weight as of this encounter: 98.4 kg.   Admit date: 03/16/2022 Discharge date: 03/17/2022  Admission Diagnoses: Degenerative disc disease lumbar spinal stenosis  Discharge Diagnoses: Same Principal Problem:   Lumbar foraminal stenosis   Discharged Condition: good  Hospital Course: Patient was admitted underwent anterior lumbar interbody fusion postoperative day very well was ambulating and voiding spontaneously tolerating regular diet and stable for discharge home.  Consults: Significant Diagnostic Studies: Treatments: ALIF Discharge Exam: Blood pressure (!) 149/71, pulse 63, temperature 97.7 F (36.5 C), temperature source Oral, resp. rate 18, height 5\' 6"  (1.676 m), weight 98.4 kg, SpO2 98 %. Strength 5 out of 5 and clean dry and intact  Disposition: Home   Allergies as of 03/17/2022       Reactions   Other Itching, Rash, Other (See Comments)   VICRYL Sutures   Tape Itching, Other (See Comments), Rash   Occlusive tape and bandaids        Medication List     TAKE these medications    Benadryl Allergy 25 MG tablet Generic drug: diphenhydrAMINE Take 25 mg by mouth at bedtime.   chlorhexidine 4 % external liquid Commonly known as: HIBICLENS Apply 1 Application topically daily as needed (boils).   Combivent Respimat 20-100 MCG/ACT Aers respimat Generic drug: Ipratropium-Albuterol Inhale 1 puff into the lungs every 6 (six) hours as needed for wheezing or shortness of breath.   finasteride 5 MG tablet Commonly known as: PROSCAR Take 5 mg by mouth daily.   fluticasone 50 MCG/ACT nasal spray Commonly known as: FLONASE Place 2 sprays into both nostrils daily.   furosemide 20 MG tablet Commonly known as: LASIX Take 1 tablet by mouth  once daily   gabapentin 300 MG capsule Commonly known as: NEURONTIN Take 300 mg by mouth 4 (four) times daily.   ibuprofen 200 MG tablet Commonly known as: ADVIL Take 400 mg by mouth 4 (four) times daily as needed for moderate pain.   latanoprost 0.005 % ophthalmic solution Commonly known as: XALATAN Place 1 drop into both eyes at bedtime.   magnesium hydroxide 400 MG/5ML suspension Commonly known as: MILK OF MAGNESIA Take 15 mLs by mouth at bedtime.   methocarbamol 750 MG tablet Commonly known as: Robaxin-750 Take 1 tablet (750 mg total) by mouth every 8 (eight) hours as needed for muscle spasms.   metoprolol succinate 100 MG 24 hr tablet Commonly known as: TOPROL-XL Take 100 mg by mouth at bedtime. Take with or immediately following a meal.   montelukast 10 MG tablet Commonly known as: SINGULAIR TAKE 1 TABLET BY MOUTH AT  BEDTIME   Mucinex Maximum Strength 1200 MG Tb12 Generic drug: Guaifenesin Take 1,200 mg by mouth 2 (two) times daily.   multivitamin with minerals Tabs tablet Take 1 tablet by mouth daily.   omeprazole 40 MG capsule Commonly known as: PRILOSEC Take 1 capsule (40 mg total) by mouth daily before breakfast.   Oxycodone HCl 10 MG Tabs Take 10 mg by mouth every 4 (four) hours as needed (pain).   oxymetazoline 0.05 % nasal spray Commonly known as: AFRIN Place 2 sprays into both nostrils 2 (two) times daily as needed for congestion.   PreserVision AREDS 2+Multi Vit Caps Take 1 capsule by mouth in the morning and at  bedtime.   pseudoephedrine 30 MG tablet Commonly known as: SUDAFED Take 60 mg by mouth 3 (three) times daily.   Simethicone 125 MG Caps Take 250 mg by mouth 4 (four) times daily as needed (for gas).   simvastatin 20 MG tablet Commonly known as: ZOCOR Take 20 mg by mouth at bedtime.   sodium chloride 0.65 % Soln nasal spray Commonly known as: OCEAN Place 1 spray into both nostrils as needed for congestion. As needed    spironolactone 25 MG tablet Commonly known as: ALDACTONE Take 12.5 mg by mouth at bedtime.   Systane Complete PF 0.6 % Soln Generic drug: Propylene Glycol (PF) Place 1 drop into both eyes 3 (three) times daily as needed (dry eyes).   tamsulosin 0.4 MG Caps capsule Commonly known as: FLOMAX Take 0.4 mg by mouth 2 (two) times daily.   telmisartan 40 MG tablet Commonly known as: MICARDIS Take 40 mg by mouth in the morning and at bedtime.   Trelegy Ellipta 200-62.5-25 MCG/ACT Aepb Generic drug: Fluticasone-Umeclidin-Vilant USE 1 INHALATION BY MOUTH ONCE  DAILY AT THE SAME TIME EACH DAY   vitamin C 1000 MG tablet Take 1,000 mg by mouth daily.   Vitamin D3 50 MCG (2000 UT) Caps Generic drug: Cholecalciferol Take 4,000 Units by mouth daily.   Zinc 50 MG Tabs Take 50 mg by mouth daily.               Durable Medical Equipment  (From admission, onward)           Start     Ordered   03/16/22 1115  DME Walker rolling  Once       Question:  Patient needs a walker to treat with the following condition  Answer:  Lumbar foraminal stenosis   03/16/22 1114   03/16/22 1115  DME 3 n 1  Once        03/16/22 1114             Signed: Elaina Hoops 03/17/2022, 8:40 AM

## 2022-03-17 NOTE — Evaluation (Signed)
Physical Therapy Evaluation Patient Details Name: Cody Tate MRN: [id number] DOB: Mar 23, 1950 Today's Date: 03/17/2022  History of Present Illness  Pt is a 72 y/o male admitted secondary to L5-S1 ALIF. PMH includes HTN, back sx, prostate cancer, and COPD.  Clinical Impression  Patient is s/p above surgery resulting in the deficits listed below (see PT Problem List). Pt tolerated gait and stair navigation well this session. Requiring min guard A for all mobility tasks using RW. Reviewed precautions, walking program and how to maintain with car transfers. Pt reports wife can assist as needed. Would benefit from outpatient PT once cleared by MD. No further acute PT needs. Will sign off. If needs change, please re-consult.       Recommendations for follow up therapy are one component of a multi-disciplinary discharge planning process, led by the attending physician.  Recommendations may be updated based on patient status, additional functional criteria and insurance authorization.  Follow Up Recommendations Other (comment) (would benefit from outpatient PT once cleared by MD.)      Assistance Recommended at Discharge Intermittent Supervision/Assistance  Patient can return home with the following  A little help with bathing/dressing/bathroom;Assistance with cooking/housework;Assist for transportation;Help with stairs or ramp for entrance    Equipment Recommendations None recommended by PT  Recommendations for Other Services       Functional Status Assessment Patient has had a recent decline in their functional status and demonstrates the ability to make significant improvements in function in a reasonable and predictable amount of time.     Precautions / Restrictions Precautions Precautions: Back Precaution Booklet Issued: Yes (comment) Precaution Comments: Reviewed back precautions with pt. Required Braces or Orthoses: Spinal Brace Spinal Brace: Lumbar corset;Applied in sitting  position Restrictions Weight Bearing Restrictions: No      Mobility  Bed Mobility               General bed mobility comments: Up with OT upon entry    Transfers Overall transfer level: Needs assistance Equipment used: Rolling walker (2 wheels) Transfers: Sit to/from Stand Sit to Stand: Min guard           General transfer comment: Min guard for safety.    Ambulation/Gait Ambulation/Gait assistance: Min guard Gait Distance (Feet): 115 Feet Assistive device: Rolling walker (2 wheels) Gait Pattern/deviations: Step-through pattern Gait velocity: Decreased     General Gait Details: Slow, cautious gait. Cues for upright posture and proximity to device. Educated about generalized walking program to perform at home.  Stairs Stairs: Yes Stairs assistance: Min guard Stair Management: Two rails, Step to pattern, Forwards Number of Stairs: 5 General stair comments: Min guard for safety. Cues for step to pattern for increased safety.  Wheelchair Mobility    Modified Rankin (Stroke Patients Only)       Balance Overall balance assessment: Needs assistance Sitting-balance support: No upper extremity supported, Feet supported Sitting balance-Leahy Scale: Good     Standing balance support: Bilateral upper extremity supported Standing balance-Leahy Scale: Poor Standing balance comment: Reliant on UE support                             Pertinent Vitals/Pain Pain Assessment Pain Assessment: Faces Faces Pain Scale: Hurts little more Pain Location: back Pain Descriptors / Indicators: Grimacing, Guarding Pain Intervention(s): Limited activity within patient's tolerance, Monitored during session, Repositioned    Home Living Family/patient expects to be discharged to:: Private residence Living Arrangements: Spouse/significant other Available Help  at Discharge: Family Type of Home: House Home Access: Stairs to enter Entrance Stairs-Rails: Can reach  both Entrance Stairs-Number of Steps: 3   Home Layout: One level Home Equipment: Conservation officer, nature (2 wheels);Rollator (4 wheels);Electric scooter      Prior Function Prior Level of Function : Independent/Modified Independent             Mobility Comments: Uses RW for short distance mobility. Has scooter for outside of home. ADLs Comments: Pt reports wife assists with donning compression stockings and occasionally with other LB ADl     Hand Dominance   Dominant Hand: Right    Extremity/Trunk Assessment   Upper Extremity Assessment Upper Extremity Assessment: Defer to OT evaluation    Lower Extremity Assessment Lower Extremity Assessment: Generalized weakness    Cervical / Trunk Assessment Cervical / Trunk Assessment: Back Surgery  Communication   Communication: No difficulties  Cognition Arousal/Alertness: Awake/alert Behavior During Therapy: WFL for tasks assessed/performed Overall Cognitive Status: Within Functional Limits for tasks assessed                                          General Comments General comments (skin integrity, edema, etc.): Pt's wife present. Reviewed back precautions and how to maintain during car transfer.    Exercises     Assessment/Plan    PT Assessment Patient does not need any further PT services  PT Problem List         PT Treatment Interventions      PT Goals (Current goals can be found in the Care Plan section)  Acute Rehab PT Goals Patient Stated Goal: to go home PT Goal Formulation: With patient Time For Goal Achievement: 03/17/22 Potential to Achieve Goals: Good    Frequency       Co-evaluation               AM-PAC PT "6 Clicks" Mobility  Outcome Measure Help needed turning from your back to your side while in a flat bed without using bedrails?: None Help needed moving from lying on your back to sitting on the side of a flat bed without using bedrails?: A Little Help needed moving to and  from a bed to a chair (including a wheelchair)?: A Little Help needed standing up from a chair using your arms (e.g., wheelchair or bedside chair)?: A Little Help needed to walk in hospital room?: A Little Help needed climbing 3-5 steps with a railing? : A Little 6 Click Score: 19    End of Session   Activity Tolerance: Patient tolerated treatment well Patient left: in bed;with call bell/phone within reach;with family/visitor present Nurse Communication: Mobility status PT Visit Diagnosis: Pain;Other abnormalities of gait and mobility (R26.89) Pain - part of body:  (back)    Time: [id number] PT Time Calculation (min) (ACUTE ONLY): 12 min   Charges:   PT Evaluation $PT Eval Low Complexity: 1 Low          Reuel Derby, PT, DPT  Acute Rehabilitation Services  Office: [phone]   Rudean Hitt 03/17/2022, 8:58 AM

## 2022-03-17 NOTE — Care Management (Signed)
Patient with order to DC to home today. Unit staff to provide DME needed for home.   No HH needs identified Patient will have family/ friends provide transportation home. No other TOC needs identified for DC 

## 2022-03-17 NOTE — Evaluation (Signed)
Occupational Therapy Evaluation Patient Details Name: Cody Tate MRN: [id number] DOB: 02-26-1950 Today's Date: 03/17/2022   History of Present Illness Pt is a 72 y.o. male s/p Anterior lumbar interbody fusion L 5-S1 with cage. PMH significant for arthritis, BPH, COPD, COPD, glaucoma, heart murmur, HTN, multiple spinal surgeries, L knee arthroscopy.   Clinical Impression   PTA, pt lived with wife who assisted intermittently with LB ADL and IADL. Upon eval, pt performing LB ADL with min guard A and UB ADL with up to min A. Pt and wife educated and demonstrating use of compensatory techniques for LB ADL, grooming, brace application, toileting, and shower transfers within precautions. Reviewed caregiver body mechanics with wife and verbalizing understanding. Recommending discharge home with no OT follow up at this time. OT to sign off. Please re-consult if change in status.      Recommendations for follow up therapy are one component of a multi-disciplinary discharge planning process, led by the attending physician.  Recommendations may be updated based on patient status, additional functional criteria and insurance authorization.   Follow Up Recommendations  No OT follow up     Assistance Recommended at Discharge Intermittent Supervision/Assistance  Patient can return home with the following A little help with walking and/or transfers;A little help with bathing/dressing/bathroom;Assistance with cooking/housework;Assist for transportation;Help with stairs or ramp for entrance    Functional Status Assessment  Patient has had a recent decline in their functional status and demonstrates the ability to make significant improvements in function in a reasonable and predictable amount of time.  Equipment Recommendations  None recommended by OT    Recommendations for Other Services       Precautions / Restrictions Precautions Precautions: Back Precaution Booklet Issued: Yes  (comment) Precaution Comments: All precautions reviewed during ADL. Pt with glaucoma, but wife can read handout to him Required Braces or Orthoses: Spinal Brace Spinal Brace: Lumbar corset;Applied in sitting position Restrictions Weight Bearing Restrictions: No      Mobility Bed Mobility               General bed mobility comments: EOB on arrival and with PT on departure    Transfers Overall transfer level: Needs assistance Equipment used: Rolling walker (2 wheels) Transfers: Sit to/from Stand Sit to Stand: Supervision           General transfer comment: Supervision for STS.      Balance Overall balance assessment: Mild deficits observed, not formally tested                                         ADL either performed or assessed with clinical judgement   ADL Overall ADL's : Needs assistance/impaired Eating/Feeding: Sitting;Modified independent Eating/Feeding Details (indicate cue type and reason): incr time to locate condiments, etc Grooming: Min guard;Standing   Upper Body Bathing: Set up;Sitting   Lower Body Bathing: Supervison/ safety;Sit to/from stand   Upper Body Dressing : Minimal assistance;Standing Upper Body Dressing Details (indicate cue type and reason): brace application Lower Body Dressing: Min guard;Sit to/from stand Lower Body Dressing Details (indicate cue type and reason): Min guard A this session; wife assists when wearing TED hose; educated regarding use of compensatory techniques Toilet Transfer: Min guard;Ambulation;Rolling walker (2 wheels) Toilet Transfer Details (indicate cue type and reason): cues for appropriate placement of walker in comparrison to body Toileting- Clothing Manipulation and Hygiene: Supervision/safety;Sitting/lateral lean   Tub/  Shower Transfer: Min guard;Ambulation;Rolling walker (2 wheels)   Functional mobility during ADLs: Supervision/safety;Min guard;Rolling walker (2 wheels);Cueing for  safety General ADL Comments: MIn guard progressing to supervision with change in height or RW and cueing for RW placemnet     Vision Baseline Vision/History: 3 Glaucoma;6 Macular Degeneration Ability to See in Adequate Light: 2 Moderately impaired Patient Visual Report: No change from baseline Additional Comments: macular degeneration and glaucoma at baseline     Perception     Praxis      Pertinent Vitals/Pain Pain Assessment Pain Assessment: Faces Faces Pain Scale: Hurts a little bit Pain Location: operative site Pain Descriptors / Indicators: Discomfort, Sore Pain Intervention(s): Limited activity within patient's tolerance, Monitored during session     Hand Dominance Right   Extremity/Trunk Assessment Upper Extremity Assessment Upper Extremity Assessment: Generalized weakness   Lower Extremity Assessment Lower Extremity Assessment: Defer to PT evaluation   Cervical / Trunk Assessment Cervical / Trunk Assessment: Back Surgery   Communication Communication Communication: No difficulties   Cognition Arousal/Alertness: Awake/alert Behavior During Therapy: WFL for tasks assessed/performed Overall Cognitive Status: Within Functional Limits for tasks assessed                                 General Comments: Occasional cues for problem solving     General Comments  VSS. Wife present and supportive. Reviewed caregiver body mechanics with wife for assisting with LB ADL    Exercises     Shoulder Instructions      Home Living Family/patient expects to be discharged to:: Private residence Living Arrangements: Spouse/significant other Available Help at Discharge: Family;Available 24 hours/day Type of Home: House Home Access: Stairs to enter [employer] of Steps: 3 Entrance Stairs-Rails: Left;Right;Can reach both Home Layout: One level     Bathroom Shower/Tub: Tub/shower unit;Walk-in shower   Bathroom Toilet: Handicapped  height Bathroom Accessibility: Yes How Accessible: Accessible via walker (RW only) Home Equipment: [place] (2 wheels);Rollator (4 wheels);Shower seat - built in;Grab bars - toilet;Grab bars - tub/shower;Hand held shower head;Adaptive equipment Adaptive Equipment: Reacher        Prior Functioning/Environment Prior Level of Function : Needs assist             Mobility Comments: RW or rollator, wife usually nearby ADLs Comments: Pt reports wife assists with donning compression stockings and occasionally with other LB ADl        OT Problem List: Decreased strength;Decreased activity tolerance;Impaired balance (sitting and/or standing)      OT Treatment/Interventions:      OT Goals(Current goals can be found in the care plan section) Acute Rehab OT Goals Patient Stated Goal: walk without AD OT Goal Formulation: With patient  OT Frequency:      Co-evaluation              AM-PAC OT "6 Clicks" Daily Activity     Outcome Measure Help from another person eating meals?: None Help from another person taking care of personal grooming?: A Little Help from another person toileting, which includes using toliet, bedpan, or urinal?: A Little Help from another person bathing (including washing, rinsing, drying)?: A Little Help from another person to put on and taking off regular upper body clothing?: A Little Help from another person to put on and taking off regular lower body clothing?: A Little 6 Click Score: 19   End of Session Equipment Utilized During Treatment: Gait belt;Rolling walker (  2 wheels);Back brace Nurse Communication: Mobility status  Activity Tolerance: Patient tolerated treatment well Patient left: Other (comment) (in hall with PT)  OT Visit Diagnosis: Unsteadiness on feet (R26.81);Other abnormalities of gait and mobility (R26.89);Muscle weakness (generalized) (M62.81);Low vision, both eyes (H54.2)                Time: [id number] OT Time Calculation  (min): 28 min Charges:  OT General Charges $OT Visit: 1 Visit OT Evaluation $OT Eval Low Complexity: 1 Low OT Treatments $Self Care/Home Management : 8-22 mins  Elder Cyphers, OTR/L [facility] Acute Rehabilitation Office: [phone]   Magnus Ivan 03/17/2022, 8:54 AM

## 2022-03-17 NOTE — Progress Notes (Signed)
Patient alert and oriented, mae's well, voiding adequate amount of urine, swallowing without difficulty, no c/o pain at time of discharge. Patient discharged home with family. Script and discharged instructions given to patient. Patient and family stated understanding of instructions given. Patient has an appointment with Dr. Pool  

## 2022-03-19 ENCOUNTER — Encounter (HOSPITAL_COMMUNITY): Payer: Self-pay | Admitting: Neurosurgery

## 2022-03-21 DIAGNOSIS — G8929 Other chronic pain: Secondary | ICD-10-CM | POA: Diagnosis not present

## 2022-03-21 DIAGNOSIS — I10 Essential (primary) hypertension: Secondary | ICD-10-CM | POA: Diagnosis not present

## 2022-03-21 DIAGNOSIS — E7800 Pure hypercholesterolemia, unspecified: Secondary | ICD-10-CM | POA: Diagnosis not present

## 2022-03-21 DIAGNOSIS — K219 Gastro-esophageal reflux disease without esophagitis: Secondary | ICD-10-CM | POA: Diagnosis not present

## 2022-03-21 DIAGNOSIS — J449 Chronic obstructive pulmonary disease, unspecified: Secondary | ICD-10-CM | POA: Diagnosis not present

## 2022-03-21 MED FILL — Heparin Sodium (Porcine) Inj 1000 Unit/ML: INTRAMUSCULAR | Qty: 30 | Status: AC

## 2022-04-10 DIAGNOSIS — H524 Presbyopia: Secondary | ICD-10-CM | POA: Diagnosis not present

## 2022-04-10 DIAGNOSIS — H353212 Exudative age-related macular degeneration, right eye, with inactive choroidal neovascularization: Secondary | ICD-10-CM | POA: Diagnosis not present

## 2022-04-10 DIAGNOSIS — H401331 Pigmentary glaucoma, bilateral, mild stage: Secondary | ICD-10-CM | POA: Diagnosis not present

## 2022-04-18 DIAGNOSIS — Z23 Encounter for immunization: Secondary | ICD-10-CM | POA: Diagnosis not present

## 2022-04-19 DIAGNOSIS — M48061 Spinal stenosis, lumbar region without neurogenic claudication: Secondary | ICD-10-CM | POA: Diagnosis not present

## 2022-04-29 ENCOUNTER — Other Ambulatory Visit: Payer: Self-pay | Admitting: Pulmonary Disease

## 2022-04-30 DIAGNOSIS — H353231 Exudative age-related macular degeneration, bilateral, with active choroidal neovascularization: Secondary | ICD-10-CM | POA: Diagnosis not present

## 2022-05-16 DIAGNOSIS — M48061 Spinal stenosis, lumbar region without neurogenic claudication: Secondary | ICD-10-CM | POA: Diagnosis not present

## 2022-06-07 DIAGNOSIS — J449 Chronic obstructive pulmonary disease, unspecified: Secondary | ICD-10-CM | POA: Diagnosis not present

## 2022-06-07 DIAGNOSIS — K9089 Other intestinal malabsorption: Secondary | ICD-10-CM | POA: Diagnosis not present

## 2022-06-07 DIAGNOSIS — Z79899 Other long term (current) drug therapy: Secondary | ICD-10-CM | POA: Diagnosis not present

## 2022-06-07 DIAGNOSIS — K219 Gastro-esophageal reflux disease without esophagitis: Secondary | ICD-10-CM | POA: Diagnosis not present

## 2022-06-07 DIAGNOSIS — C61 Malignant neoplasm of prostate: Secondary | ICD-10-CM | POA: Diagnosis not present

## 2022-06-07 DIAGNOSIS — I10 Essential (primary) hypertension: Secondary | ICD-10-CM | POA: Diagnosis not present

## 2022-06-07 DIAGNOSIS — E559 Vitamin D deficiency, unspecified: Secondary | ICD-10-CM | POA: Diagnosis not present

## 2022-06-07 DIAGNOSIS — I700 Atherosclerosis of aorta: Secondary | ICD-10-CM | POA: Diagnosis not present

## 2022-06-07 DIAGNOSIS — R739 Hyperglycemia, unspecified: Secondary | ICD-10-CM | POA: Diagnosis not present

## 2022-06-07 DIAGNOSIS — Z0000 Encounter for general adult medical examination without abnormal findings: Secondary | ICD-10-CM | POA: Diagnosis not present

## 2022-06-07 DIAGNOSIS — Z23 Encounter for immunization: Secondary | ICD-10-CM | POA: Diagnosis not present

## 2022-06-07 DIAGNOSIS — E7800 Pure hypercholesterolemia, unspecified: Secondary | ICD-10-CM | POA: Diagnosis not present

## 2022-06-07 DIAGNOSIS — D649 Anemia, unspecified: Secondary | ICD-10-CM | POA: Diagnosis not present

## 2022-06-21 DIAGNOSIS — Z6835 Body mass index (BMI) 35.0-35.9, adult: Secondary | ICD-10-CM | POA: Diagnosis not present

## 2022-06-21 DIAGNOSIS — M48061 Spinal stenosis, lumbar region without neurogenic claudication: Secondary | ICD-10-CM | POA: Diagnosis not present

## 2022-06-25 DIAGNOSIS — H353231 Exudative age-related macular degeneration, bilateral, with active choroidal neovascularization: Secondary | ICD-10-CM | POA: Diagnosis not present

## 2022-06-25 DIAGNOSIS — H401132 Primary open-angle glaucoma, bilateral, moderate stage: Secondary | ICD-10-CM | POA: Diagnosis not present

## 2022-06-25 DIAGNOSIS — H43813 Vitreous degeneration, bilateral: Secondary | ICD-10-CM | POA: Diagnosis not present

## 2022-06-28 DIAGNOSIS — Z5181 Encounter for therapeutic drug level monitoring: Secondary | ICD-10-CM | POA: Diagnosis not present

## 2022-07-10 DIAGNOSIS — D509 Iron deficiency anemia, unspecified: Secondary | ICD-10-CM | POA: Diagnosis not present

## 2022-07-10 DIAGNOSIS — K2100 Gastro-esophageal reflux disease with esophagitis, without bleeding: Secondary | ICD-10-CM | POA: Diagnosis not present

## 2022-07-30 DIAGNOSIS — K219 Gastro-esophageal reflux disease without esophagitis: Secondary | ICD-10-CM | POA: Diagnosis not present

## 2022-07-30 DIAGNOSIS — Z86010 Personal history of colonic polyps: Secondary | ICD-10-CM | POA: Diagnosis not present

## 2022-07-30 DIAGNOSIS — E611 Iron deficiency: Secondary | ICD-10-CM | POA: Diagnosis not present

## 2022-08-15 DIAGNOSIS — C61 Malignant neoplasm of prostate: Secondary | ICD-10-CM | POA: Diagnosis not present

## 2022-08-16 DIAGNOSIS — K219 Gastro-esophageal reflux disease without esophagitis: Secondary | ICD-10-CM | POA: Diagnosis not present

## 2022-08-16 DIAGNOSIS — K922 Gastrointestinal hemorrhage, unspecified: Secondary | ICD-10-CM | POA: Diagnosis not present

## 2022-08-16 DIAGNOSIS — K2289 Other specified disease of esophagus: Secondary | ICD-10-CM | POA: Diagnosis not present

## 2022-08-16 DIAGNOSIS — K2270 Barrett's esophagus without dysplasia: Secondary | ICD-10-CM | POA: Diagnosis not present

## 2022-08-16 DIAGNOSIS — D509 Iron deficiency anemia, unspecified: Secondary | ICD-10-CM | POA: Diagnosis not present

## 2022-08-20 DIAGNOSIS — K2270 Barrett's esophagus without dysplasia: Secondary | ICD-10-CM | POA: Diagnosis not present

## 2022-08-20 DIAGNOSIS — H353231 Exudative age-related macular degeneration, bilateral, with active choroidal neovascularization: Secondary | ICD-10-CM | POA: Diagnosis not present

## 2022-08-22 DIAGNOSIS — N99114 Postprocedural urethral stricture, male, unspecified: Secondary | ICD-10-CM | POA: Diagnosis not present

## 2022-08-22 DIAGNOSIS — N3941 Urge incontinence: Secondary | ICD-10-CM | POA: Diagnosis not present

## 2022-08-22 DIAGNOSIS — N401 Enlarged prostate with lower urinary tract symptoms: Secondary | ICD-10-CM | POA: Diagnosis not present

## 2022-08-22 DIAGNOSIS — C61 Malignant neoplasm of prostate: Secondary | ICD-10-CM | POA: Diagnosis not present

## 2022-08-23 ENCOUNTER — Telehealth: Payer: Self-pay | Admitting: Pulmonary Disease

## 2022-08-23 NOTE — Telephone Encounter (Signed)
Needs a refill for Ipratropium-Albuterol (COMBIVENT RESPIMAT) 20-100 MCG/ACT AERS respimat   Pharmacy [facility]

## 2022-08-30 ENCOUNTER — Other Ambulatory Visit (HOSPITAL_BASED_OUTPATIENT_CLINIC_OR_DEPARTMENT_OTHER): Payer: Self-pay

## 2022-08-30 MED ORDER — COMBIVENT RESPIMAT 20-100 MCG/ACT IN AERS: 1.0000 | INHALATION_SPRAY | Freq: Four times a day (QID) | RESPIRATORY_TRACT | 3 refills | Status: DC | PRN

## 2022-08-30 NOTE — Telephone Encounter (Signed)
Refill sent.

## 2022-09-05 DIAGNOSIS — Z6836 Body mass index (BMI) 36.0-36.9, adult: Secondary | ICD-10-CM | POA: Diagnosis not present

## 2022-09-05 DIAGNOSIS — M48061 Spinal stenosis, lumbar region without neurogenic claudication: Secondary | ICD-10-CM | POA: Diagnosis not present

## 2022-09-14 ENCOUNTER — Ambulatory Visit (HOSPITAL_COMMUNITY)
Admission: RE | Admit: 2022-09-14 | Discharge: 2022-09-14 | Disposition: A | Discharge status: Home / Self Care | Payer: Medicare Other | Source: Ambulatory Visit | Attending: Internal Medicine | Admitting: Internal Medicine

## 2022-09-14 DIAGNOSIS — D329 Benign neoplasm of meninges, unspecified: Secondary | ICD-10-CM | POA: Diagnosis not present

## 2022-09-14 DIAGNOSIS — R220 Localized swelling, mass and lump, head: Secondary | ICD-10-CM | POA: Diagnosis not present

## 2022-09-14 DIAGNOSIS — D320 Benign neoplasm of cerebral meninges: Secondary | ICD-10-CM | POA: Diagnosis not present

## 2022-09-14 MED ORDER — SODIUM CHLORIDE (PF) 0.9 % IJ SOLN
INTRAMUSCULAR | Status: AC
  Filled 2022-09-14: qty 50

## 2022-09-14 MED ORDER — IOHEXOL 300 MG/ML  SOLN
75.0000 mL | Freq: Once | INTRAMUSCULAR | Status: AC | PRN
  Administered 2022-09-14: 75 mL via INTRAVENOUS

## 2022-09-17 DIAGNOSIS — H353231 Exudative age-related macular degeneration, bilateral, with active choroidal neovascularization: Secondary | ICD-10-CM | POA: Diagnosis not present

## 2022-09-17 DIAGNOSIS — G894 Chronic pain syndrome: Secondary | ICD-10-CM | POA: Diagnosis not present

## 2022-09-17 DIAGNOSIS — K219 Gastro-esophageal reflux disease without esophagitis: Secondary | ICD-10-CM | POA: Diagnosis not present

## 2022-09-24 ENCOUNTER — Telehealth: Payer: Self-pay | Admitting: Internal Medicine

## 2022-09-24 NOTE — Telephone Encounter (Signed)
Scheduled per 09/15 scheduling message, patient has been called and voicemail was left regarding upcoming appointments.

## 2022-10-01 ENCOUNTER — Inpatient Hospital Stay: Payer: Medicare Other | Attending: Internal Medicine | Admitting: Internal Medicine

## 2022-10-01 VITALS — BP 122/54 | HR 97 | Temp 97.5°F | Resp 18 | Wt 231.6 lb

## 2022-10-01 DIAGNOSIS — D329 Benign neoplasm of meninges, unspecified: Secondary | ICD-10-CM | POA: Diagnosis not present

## 2022-10-01 NOTE — Progress Notes (Signed)
[place] [facility] at [facility] [address]  [place], [place] [address] [phone]   Interval Evaluation  Date of Service: 10/01/22 [name] Patient MRN: [id number] Patient DOB: 1950/10/17 Provider: Henreitta Leber, MD  Identifying Statement:  Cody Tate is a 72 y.o. male with  right tentorial  meningioma   Referring Provider: Emilio Aspen, MD 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  No further issues with vision.  Walking is limited by chronic back pain, presents today in a wheelchair.  He did have laminectomy with Dr. Jordan Likes in March.  He otherwise denies new or progressive changes today.  Medications: Current Outpatient Medications on File Prior to Visit  Medication Sig Dispense Refill   Ascorbic Acid (VITAMIN C) 1000 MG tablet Take 1,000 mg by mouth daily.     chlorhexidine (HIBICLENS) 4 % external liquid Apply 1 Application topically daily as needed (boils).     Cholecalciferol (VITAMIN D3) 50 MCG (2000 UT) capsule Take 4,000 Units by mouth daily.     diphenhydrAMINE (BENADRYL ALLERGY) 25 MG tablet Take 25 mg by mouth at bedtime.     finasteride (PROSCAR) 5 MG tablet Take 5 mg by mouth daily.     fluticasone (FLONASE) 50 MCG/ACT nasal spray Place 2 sprays into both nostrils daily.      furosemide (LASIX) 20 MG tablet Take 1 tablet by mouth once daily 90 tablet 1   gabapentin (NEURONTIN) 300 MG capsule Take 300 mg by mouth 4 (four) times daily.     Guaifenesin (MUCINEX MAXIMUM STRENGTH) 1200 MG TB12 Take 1,200 mg by mouth 2 (two) times daily.     ibuprofen (ADVIL,MOTRIN) 200 MG tablet Take 400 mg by mouth 4 (four) times daily as needed for moderate pain.     Ipratropium-Albuterol (COMBIVENT RESPIMAT) 20-100 MCG/ACT AERS respimat Inhale 1 puff into  the lungs every 6 (six) hours as needed for wheezing or shortness of breath. 12 g 3   latanoprost (XALATAN) 0.005 % ophthalmic solution Place 1 drop into both eyes at bedtime.      magnesium hydroxide (MILK OF MAGNESIA) 400 MG/5ML suspension Take 15 mLs by mouth at bedtime.     methocarbamol (ROBAXIN-750) 750 MG tablet Take 1 tablet (750 mg total) by mouth every 8 (eight) hours as needed for muscle spasms. 30 tablet 0   metoprolol succinate (TOPROL-XL) 100 MG 24 hr tablet Take 100 mg by mouth at bedtime. Take with or immediately following a meal.     montelukast (SINGULAIR) 10 MG tablet TAKE 1 TABLET BY MOUTH AT  BEDTIME 90 tablet 3   Multiple Vitamin (MULTIVITAMIN WITH MINERALS) TABS tablet Take 1 tablet by mouth daily.     Multiple Vitamins-Minerals (PRESERVISION AREDS 2+MULTI VIT) CAPS Take 1 capsule by mouth in the morning and at bedtime.     omeprazole (PRILOSEC) 40 MG capsule Take 1 capsule (40 mg total) by mouth daily before breakfast. 90 capsule 3   Oxycodone HCl 10 MG TABS Take 10 mg by mouth every 4 (four) hours as needed (pain).     oxymetazoline (AFRIN) 0.05 % nasal spray Place 2 sprays into both nostrils 2 (two) times daily as needed for congestion.     Propylene Glycol, PF, (SYSTANE COMPLETE PF) 0.6 % SOLN Place 1 drop into both  eyes 3 (three) times daily as needed (dry eyes).     pseudoephedrine (SUDAFED) 30 MG tablet Take 60 mg by mouth 3 (three) times daily.     Simethicone 125 MG CAPS Take 250 mg by mouth 4 (four) times daily as needed (for gas).     simvastatin (ZOCOR) 20 MG tablet Take 20 mg by mouth at bedtime.      sodium chloride (OCEAN) 0.65 % SOLN nasal spray Place 1 spray into both nostrils as needed for congestion. As needed     spironolactone (ALDACTONE) 25 MG tablet Take 12.5 mg by mouth at bedtime.      Tamsulosin HCl (FLOMAX) 0.4 MG CAPS Take 0.4 mg by mouth 2 (two) times daily.     telmisartan (MICARDIS) 40 MG tablet Take 40 mg by mouth in the morning and at bedtime.      TRELEGY ELLIPTA 200-62.5-25 MCG/ACT AEPB USE 1 INHALATION BY MOUTH ONCE  DAILY AT THE SAME TIME EACH DAY 180 each 3   Zinc 50 MG TABS Take 50 mg by mouth daily.     No current facility-administered medications on file prior to visit.    Allergies:  Allergies  Allergen Reactions   Other Itching, Rash and Other (See Comments)    VICRYL Sutures   Tape Itching, Other (See Comments) and Rash    Occlusive tape and bandaids   Past Medical History:  Past Medical History:  Diagnosis Date   Arthritis    Back pain    Radiates down both legs   BPH (benign prostatic hyperplasia)    Cancer (HCC) 2022   prostate cancer   COPD (chronic obstructive pulmonary disease) (HCC)    Dyspnea    Enlarged prostate    GERD (gastroesophageal reflux disease)    Glaucoma    H/O hiatal hernia    H/O wheezing    occ inhaler usage   Heart murmur    Hypertension    Lumbar foraminal stenosis    Macular degeneration    Meningioma (HCC) 2014   Treated with radiation   Neuromuscular disorder (HCC)    Neuropathy bilateral legs   PONV (postoperative nausea and vomiting)    Pulmonary nodules    resolved on 2019 follow up CT   S/P insertion of spinal cord stimulator    Past Surgical History:  Past Surgical History:  Procedure Laterality Date   ABDOMINAL EXPOSURE N/A 03/16/2022   Procedure: ABDOMINAL EXPOSURE;  Surgeon: Cephus Shelling, MD;  [facility];  Service: Vascular;  Laterality: N/A;   ANTERIOR LUMBAR FUSION N/A 03/16/2022   Procedure: Anterior Lumbar Interbody Fusion - Lumbar Five-Sacral One with cage;  Surgeon: Julio Sicks, MD;  Location: [facility] OR;  Service: Neurosurgery;  Laterality: N/A;   BACK SURGERY  06/08/2006   lam x3 cerv x 1   CERVICAL FUSION     COLONOSCOPY  2020   CYSTOSCOPY     72 yrs old   CYSTOSCOPY WITH URETHRAL DILATATION N/A 09/21/2021   Procedure: CYSTOSCOPY WITH BALLOON DILATION and OPTILUME BALLOON DILATION OF  URETHRAL DILATATION;  Surgeon: Noel Christmas, MD;   Location: [facility] ORS;  Service: Urology;  Laterality: N/A;   EYE SURGERY     bil   GOLD SEED IMPLANT N/A 09/06/2020   Procedure: GOLD SEED IMPLANT/ PLACEMENT OF FIDUCIAL MARKERS;  Surgeon: Noel Christmas, MD;  Location: [facility] ORS;  Service: Urology;  Laterality: N/A;   KNEE ARTHROSCOPY  01/02/2008   lft   KNEE ARTHROSCOPY Left  07/23/2012   Procedure: LEFT MEDIAL MENISCAL DEBRIDEMENT AND CHONDROPLASTY;  Surgeon: Loanne Drilling, MD;  Location: [facility] ORS;  Service: Orthopedics;  Laterality: Left;   LUMBAR FUSION  02/22/2014   DR POOL   PILONIDAL CYST EXCISION     SINUS EXPLORATION     SPACE OAR INSTILLATION N/A 09/06/2020   Procedure: SPACE OAR INSTILLATION;  Surgeon: Noel Christmas, MD;  Location: [facility] ORS;  Service: Urology;  Laterality: N/A;   SPINAL CORD STIMULATOR INSERTION N/A 01/16/2018   Procedure: LUMBAR SPINAL CORD STIMULATOR INSERTION;  Surgeon: Tia Alert, MD;  Location: [facility] OR;  Service: Neurosurgery;  Laterality: N/A;  LUMBAR SPINAL CORD STIMULATOR INSERTION   TRANSURETHRAL RESECTION OF PROSTATE N/A 09/06/2020   Procedure: TRANSURETHRAL RESECTION OF THE PROSTATE (TURP);  Surgeon: Noel Christmas, MD;  Location: [facility] ORS;  Service: Urology;  Laterality: N/A;  2 HRS   WRIST GANGLION EXCISION     rt   WRIST SURGERY  01/02/2007   cyst lft   Social History:  Social History   Socioeconomic History   Marital status: Married    Spouse name: Not on file   Number of children: Not on file   Years of education: Not on file   Highest education level: Not on file  Occupational History   Not on file  Tobacco Use   Smoking status: Former    Current packs/day: 0.00    Average packs/day: 1.5 packs/day for 35.0 years (52.5 ttl pk-yrs)    Types: Cigarettes    Start date: 05/03/1965    Quit date: 05/03/2000    Years since quitting: 22.4   Smokeless tobacco: Never   Tobacco comments:    quit alcohol 73  Vaping Use   Vaping status: Never Used  Substance and Sexual Activity   Alcohol  use: Not Currently    Comment: hx of years ago   Drug use: No   Sexual activity: Not Currently  Other Topics Concern   Not on file  Social History Narrative   08-20-17 Unable to ask abuse questions wife with him today.   Social Determinants of Health   Financial Resource Strain: Not on file  Food Insecurity: Not on file  Transportation Needs: Not on file  Physical Activity: Not on file  Stress: Not on file  Social Connections: Not on file  Intimate Partner Violence: Not on file   Family History:  Family History  Problem Relation Age of Onset   COPD Maternal Uncle     Review of Systems: Constitutional: Denies fevers, chills or abnormal weight loss Eyes: Denies blurriness of vision Ears, nose, mouth, throat, and face: Denies mucositis or sore throat Respiratory: Denies cough, dyspnea or wheezes Cardiovascular: Denies palpitation, chest discomfort or lower extremity swelling Gastrointestinal:  Denies nausea, constipation, diarrhea GU: Denies dysuria or incontinence Skin: Denies abnormal skin rashes Neurological: Per HPI Musculoskeletal: Denies joint pain, back or neck discomfort. No decrease in ROM Behavioral/Psych: Denies anxiety, disturbance in thought content, and mood instability  Physical Exam: There were no vitals filed for this visit.   KPS: 70. General: Alert, cooperative, pleasant, in no acute distress Head: Craniotomy scar noted, dry and intact. EENT: No conjunctival injection or scleral icterus. Oral mucosa moist Lungs: Resp effort normal Cardiac: Regular rate and rhythm Abdomen: Soft, non-distended abdomen Skin: No rashes cyanosis or petechiae. Extremities: No clubbing or edema  Neurologic Exam: Mental Status: Awake, alert, attentive to examiner. Oriented to self and environment. Language is fluent with intact comprehension.  Cranial Nerves: Visual acuity is grossly normal. Visual fields are full. Extra-ocular movements intact. No ptosis. Face is  symmetric, tongue midline. Motor: Tone and bulk are normal. Power is full in both arms and legs. Reflexes are symmetric, no pathologic reflexes present. Intact finger to nose bilaterally Sensory: Intact to light touch and temperature Gait: Limited by pain  Labs: I have reviewed the data as listed    Component Value Date/Time   NA 135 03/12/2022 1349   NA 136 01/29/2020 1430   K 4.0 03/12/2022 1349   CL 100 03/12/2022 1349   CO2 20 (L) 03/12/2022 1349   GLUCOSE 113 (H) 03/12/2022 1349   BUN 21 03/12/2022 1349   BUN 17 01/29/2020 1430   BUN 22.8 08/05/2014 1203   CREATININE 1.09 03/12/2022 1349   CREATININE 1.0 08/05/2014 1203   CALCIUM 8.8 (L) 03/12/2022 1349   GFRNONAA >60 03/12/2022 1349   GFRAA 100 01/29/2020 1430   Lab Results  Component Value Date   WBC 18.7 (H) 03/12/2022   NEUTROABS 4.4 02/12/2014   HGB 13.3 03/12/2022   HCT 40.0 03/12/2022   MCV 83.2 03/12/2022   PLT 259 03/12/2022    Imaging: [facility] Clinician Interpretation: I have personally reviewed the CNS images as listed.  My interpretation, in the context of the patient's clinical presentation, is stable disease pending official read.  No results found.   Assessment/Plan Meningioma (HCC) [D32.9]  We appreciate the opportunity to participate in the care of Cody Tate.   He is clinically and radiographically stable today. No clinical changes today, MRI read is pending.  He will continue to follow with [facility] for management of chronic back pain and spondylosis s/p SCS placement.  We ask that Cody Tate return to clinic in  24  months following next CNS imaging, or sooner as needed.  I have spent a total of 30 minutes of face-to-face and non-face-to-face time, excluding clinical staff time, preparing to see patient, ordering tests and/or medications, counseling the patient, and independently interpreting results and communicating results to the patient/family/caregiver    Henreitta Leber, MD Medical Director of Neuro-Oncology [facility] at [facility] 10/01/22 11:51 AM

## 2022-10-09 DIAGNOSIS — H401331 Pigmentary glaucoma, bilateral, mild stage: Secondary | ICD-10-CM | POA: Diagnosis not present

## 2022-10-09 DIAGNOSIS — H353132 Nonexudative age-related macular degeneration, bilateral, intermediate dry stage: Secondary | ICD-10-CM | POA: Diagnosis not present

## 2022-10-22 DIAGNOSIS — H43813 Vitreous degeneration, bilateral: Secondary | ICD-10-CM | POA: Diagnosis not present

## 2022-10-22 DIAGNOSIS — H353231 Exudative age-related macular degeneration, bilateral, with active choroidal neovascularization: Secondary | ICD-10-CM | POA: Diagnosis not present

## 2022-10-22 DIAGNOSIS — H353133 Nonexudative age-related macular degeneration, bilateral, advanced atrophic without subfoveal involvement: Secondary | ICD-10-CM | POA: Diagnosis not present

## 2022-10-22 DIAGNOSIS — H401132 Primary open-angle glaucoma, bilateral, moderate stage: Secondary | ICD-10-CM | POA: Diagnosis not present

## 2022-10-24 DIAGNOSIS — Z23 Encounter for immunization: Secondary | ICD-10-CM | POA: Diagnosis not present

## 2022-10-24 DIAGNOSIS — Z860100 Personal history of colon polyps, unspecified: Secondary | ICD-10-CM | POA: Diagnosis not present

## 2022-10-24 DIAGNOSIS — E611 Iron deficiency: Secondary | ICD-10-CM | POA: Diagnosis not present

## 2022-10-30 ENCOUNTER — Encounter: Payer: Self-pay | Admitting: Cardiology

## 2022-10-30 ENCOUNTER — Ambulatory Visit: Payer: Medicare Other | Attending: Cardiology | Admitting: Cardiology

## 2022-10-30 VITALS — BP 128/64 | HR 85 | Ht 66.5 in | Wt 234.2 lb

## 2022-10-30 DIAGNOSIS — J42 Unspecified chronic bronchitis: Secondary | ICD-10-CM

## 2022-10-30 DIAGNOSIS — I10 Essential (primary) hypertension: Secondary | ICD-10-CM | POA: Diagnosis not present

## 2022-10-30 DIAGNOSIS — I340 Nonrheumatic mitral (valve) insufficiency: Secondary | ICD-10-CM | POA: Diagnosis not present

## 2022-10-30 DIAGNOSIS — I350 Nonrheumatic aortic (valve) stenosis: Secondary | ICD-10-CM

## 2022-10-30 DIAGNOSIS — M7989 Other specified soft tissue disorders: Secondary | ICD-10-CM | POA: Diagnosis not present

## 2022-10-30 DIAGNOSIS — E7800 Pure hypercholesterolemia, unspecified: Secondary | ICD-10-CM

## 2022-10-30 NOTE — Progress Notes (Signed)
Cardiology Office Note:   Date:  10/30/2022  ID:  Cody Tate, DOB 06-27-1950, MRN [id number] PCP: Emilio Aspen, MD  [facility] Providers Cardiologist:  Charlton Haws, MD    History of Present Illness:   Discussed the use of AI scribe software for clinical note transcription with the patient, who gave verbal consent to proceed.  History of Present Illness   The patient is a 72 year old individual with a history of hypertension, COPD (former smoker), chronic LE edema (L>R), mild aortic stenosis, hyperlipidemia, chronic back pain with nerve stimulator implant, prostate cancer, macular degeneration. Since last year's visit with Dr. Eden Emms, patient reports that he has generally been doing well but does report changes with other chronic conditions. He underwent back surgery within the past year and has been dealing with progressive macular degeneration in both eyes. He is currently receiving injections every eight weeks for the wet variant of the disease.  He expresses concerns today about recent weight gain (221lbs last year, now 234lbs). He has been experiencing increased fluid retention, evidenced by weight gain and worsening edema in the legs and abdomen over the past few weeks. Not fully clear how gradual vs sudden weight gain has been. The patient also reports a slight increase in shortness of breath but says that this has coincided with an upper respiratory infection.  The patient has a history of prostate cancer and has undergone procedures to address an enlarged prostate and a blocked urethra. Since the latter procedure, the patient has been urinating more frequently, and there has been no recent decrease in urine output, including in recent weeks coinciding with weight gain. He has continued to take Lasix 20mg  daily.   As a result of chronic back pain, patient had been taking frequent ibuprofen, was switched recently to Celebrex (200mg  twice daily) about a month  ago.  Despite the weight gain and worsened edema, the patient has noticed no significant change in his exertional tolerance over the past month.  The only other symptom mentioned by patient today was an episode of chest discomfort described as a cramping sensation behind the chest, towards his back/spine which resolved with massage of thoracic back and has not recurred.  ROS otherwise negative for orthopnea, PND, palpitations, lightheadedness/dizziness, hematuria, syncope/presyncope.     Studies Reviewed:    EKG:   EKG Interpretation Date/Time:  Tuesday October 30 2022 14:04:33 EDT Ventricular Rate:  85 PR Interval:  148 QRS Duration:  138 QT Interval:  402 QTC Calculation: 478 R Axis:   31  Text Interpretation: Normal sinus rhythm Right bundle branch block When compared with ECG of 18-Sep-2021 11:32, T wave inversion no longer evident in Inferior leads T wave inversion no longer evident in Lateral leads QT has lengthened Confirmed by Perlie Gold [phone]) on 10/30/2022 2:13:02 PM    06/27/20 TTE  IMPRESSIONS     1. Left ventricular ejection fraction, by estimation, is >75%. The left  ventricle has hyperdynamic function. The left ventricle has no regional  wall motion abnormalities. There is mild left ventricular hypertrophy.  Left ventricular diastolic parameters  are consistent with Grade I diastolic dysfunction (impaired relaxation).  Elevated left atrial pressure.   2. Right ventricular systolic function is normal. The right ventricular  size is normal.   3. The mitral valve is normal in structure. Mild mitral valve  regurgitation. No evidence of mitral stenosis.   4. The aortic valve is tricuspid. Aortic valve regurgitation is not  visualized. Mild aortic valve sclerosis  is present, with no evidence of  aortic valve stenosis.   5. The inferior vena cava is normal in size with greater than 50%  respiratory variability, suggesting right atrial pressure of 3 mmHg.    FINDINGS   Left Ventricle: Left ventricular ejection fraction, by estimation, is  >75%. The left ventricle has hyperdynamic function. The left ventricle has  no regional wall motion abnormalities. The left ventricular internal  cavity size was normal in size. There  is mild left ventricular hypertrophy. Left ventricular diastolic  parameters are consistent with Grade I diastolic dysfunction (impaired  relaxation). Elevated left atrial pressure.   Right Ventricle: The right ventricular size is normal. Right ventricular  systolic function is normal.   Left Atrium: Left atrial size was normal in size.   Right Atrium: Right atrial size was normal in size.   Pericardium: There is no evidence of pericardial effusion.   Mitral Valve: The mitral valve is normal in structure. Mild mitral valve  regurgitation. No evidence of mitral valve stenosis.   Tricuspid Valve: The tricuspid valve is normal in structure. Tricuspid  valve regurgitation is trivial. No evidence of tricuspid stenosis.   Aortic Valve: The aortic valve is tricuspid. Aortic valve regurgitation is  not visualized. Mild aortic valve sclerosis is present, with no evidence  of aortic valve stenosis.   Pulmonic Valve: The pulmonic valve was normal in structure. Pulmonic valve  regurgitation is not visualized. No evidence of pulmonic stenosis.   Aorta: The aortic root is normal in size and structure.   Venous: The inferior vena cava is normal in size with greater than 50%  respiratory variability, suggesting right atrial pressure of 3 mmHg.   IAS/Shunts: No atrial level shunt detected by color flow Doppler.    Risk Assessment/Calculations:              Physical Exam:   VS:  BP 128/64   Pulse 85   Ht 5' 6.5" (1.689 m)   Wt 234 lb 3.2 oz (106.2 kg)   SpO2 97%   BMI 37.23 kg/m    Wt Readings from Last 3 Encounters:  10/30/22 234 lb 3.2 oz (106.2 kg)  10/01/22 231 lb 9.6 oz (105.1 kg)  03/16/22 217 lb (98.4 kg)      Physical Exam Vitals reviewed.  Constitutional:      Appearance: Normal appearance.  HENT:     Head: Normocephalic.  Eyes:     Pupils: Pupils are equal, round, and reactive to light.  Cardiovascular:     Rate and Rhythm: Normal rate and regular rhythm.     Pulses: Normal pulses.     Heart sounds: Murmur (2/6 over RUSB) heard.  Pulmonary:     Effort: Pulmonary effort is normal.     Breath sounds: Rhonchi (upper lobes bilaterally) present.  Abdominal:     General: Abdomen is flat. There is distension (firm).     Palpations: Abdomen is soft.  Musculoskeletal:     Right lower leg: Edema (2+) present.     Left lower leg: Edema (2-3+) present.  Skin:    General: Skin is warm and dry.     Capillary Refill: Capillary refill takes less than 2 seconds.  Neurological:     General: No focal deficit present.     Mental Status: He is alert and oriented to person, place, and time.  Psychiatric:        Mood and Affect: Mood normal.        Behavior: Behavior  normal.        Thought Content: Thought content normal.        Judgment: Judgment normal.    ASSESSMENT AND PLAN:     Assessment and Plan    Chronic LE Edema (L>R) Fluid Retention Weight 221->234 over the last year. Patient with recent observation of increased leg swelling and abdominal distension. 2-3+ bilateral LE edema left greater than right (patient wearing compression stockings). Had negative BNP in 2022 and last TTE in 2022 without evidence of CHF. Possible causes include heart failure, worsening AS, kidney dysfunction, or liver disease. Low suspicion for vascular/DVT causes of asymmetrical edema as patient reports chronic for years. Despite edema, no significant dyspnea/orthopnea.  -Check kidney function, liver function, thyroid function, and NT pro BNP. -Order echocardiogram to assess cardiac function and aortic stenosis progression. -Continue with daily compression stocking use -Consider increasing Lasix dose  depending on lab results. Given such frequent NSAID use, do not want to initiate higher Lasix dosing without establishing current renal function.  -Patient advised to seek emergency department evaluation for acute dyspnea or chest pain.   Chronic Obstructive Pulmonary Disease (COPD) Recent upper respiratory infection possibly exacerbating baseline shortness of breath. -Continue current management and monitor symptoms.  Aortic stenosis Mild MR Mild "sclerosis" noted on echocardiogram in 2022 but prior chart notes indicate hx stenosis. Mild MR also noted on same study. 2/6 systolic murmur over RUSB today on exam. Possible progression of valve dysfunction with fluid retention. -Assess valve on pending echocardiogram.  Hypertension Stable on current regimen of metoprolol succinate 100mg , spironolactone, and telmisartan. -Continue current regimen.  Hyperlipidemia No recent changes or concerns. -Continue current management.  Chronic Back Pain Recently started on Celebrex 200mg  twice daily due to stomach irritation from ibuprofen. -Check kidney function due to potential impact of chronic NSAIDs.  Hyperlipidemia LDL 80 on June 2024 labs with PCP. At goal, will defer to PCP for ongoing management.    Follow-up in 1 month to review lab and echocardiogram results.             Signed, Perlie Gold, PA-C

## 2022-10-30 NOTE — Patient Instructions (Signed)
Medication Instructions:  Your physician recommends that you continue on your current medications as directed. Please refer to the Current Medication list given to you today. *If you need a refill on your cardiac medications before your next appointment, please call your pharmacy*   Lab Work: TODAY-BNP, CMET, & TSH If you have labs (blood work) drawn today and your tests are completely normal, you will receive your results only by: MyChart Message (if you have MyChart) OR A paper copy in the mail If you have any lab test that is abnormal or we need to change your treatment, we will call you to review the results.   Testing/Procedures: Your physician has requested that you have an echocardiogram. Echocardiography is a painless test that uses sound waves to create images of your heart. It provides your doctor with information about the size and shape of your heart and how well your heart's chambers and valves are working. This procedure takes approximately one hour. There are no restrictions for this procedure. Please do NOT wear cologne, perfume, aftershave, or lotions (deodorant is allowed). Please arrive 15 minutes prior to your appointment time.   Follow-Up: At [facility], you and your health needs are our priority.  As part of our continuing mission to provide you with exceptional heart care, we have created designated Provider Care Teams.  These Care Teams include your primary Cardiologist (physician) and Advanced Practice Providers (APPs -  Physician Assistants and Nurse Practitioners) who all work together to provide you with the care you need, when you need it.  We recommend signing up for the patient portal called "MyChart".  Sign up information is provided on this After Visit Summary.  MyChart is used to connect with patients for Virtual Visits (Telemedicine).  Patients are able to view lab/test results, encounter notes, upcoming appointments, etc.  Non-urgent messages can  be sent to your provider as well.   To learn more about what you can do with MyChart, go to [email or web address].    Your next appointment:   1 month(s)  Provider:   Perlie Gold, PA-C   Other Instructions

## 2022-10-31 ENCOUNTER — Telehealth: Payer: Self-pay

## 2022-10-31 DIAGNOSIS — I350 Nonrheumatic aortic (valve) stenosis: Secondary | ICD-10-CM

## 2022-10-31 DIAGNOSIS — Z79899 Other long term (current) drug therapy: Secondary | ICD-10-CM

## 2022-10-31 DIAGNOSIS — R7989 Other specified abnormal findings of blood chemistry: Secondary | ICD-10-CM

## 2022-10-31 DIAGNOSIS — I340 Nonrheumatic mitral (valve) insufficiency: Secondary | ICD-10-CM

## 2022-10-31 LAB — COMPREHENSIVE METABOLIC PANEL
ALT: 33 [IU]/L (ref 0–44)
AST: 35 [IU]/L (ref 0–40)
Albumin: 3.2 g/dL — ABNORMAL LOW (ref 3.8–4.8)
Alkaline Phosphatase: 201 [IU]/L — ABNORMAL HIGH (ref 44–121)
BUN/Creatinine Ratio: 21 (ref 10–24)
BUN: 17 mg/dL (ref 8–27)
Bilirubin Total: 0.5 mg/dL (ref 0.0–1.2)
CO2: 24 mmol/L (ref 20–29)
Calcium: 9.2 mg/dL (ref 8.6–10.2)
Chloride: 97 mmol/L (ref 96–106)
Creatinine, Ser: 0.82 mg/dL (ref 0.76–1.27)
Globulin, Total: 2.5 g/dL (ref 1.5–4.5)
Glucose: 149 mg/dL — ABNORMAL HIGH (ref 70–99)
Potassium: 4.2 mmol/L (ref 3.5–5.2)
Sodium: 136 mmol/L (ref 134–144)
Total Protein: 5.7 g/dL — ABNORMAL LOW (ref 6.0–8.5)
eGFR: 93 mL/min/{1.73_m2} (ref >59)

## 2022-10-31 LAB — PRO B NATRIURETIC PEPTIDE: NT-Pro BNP: 543 pg/mL — ABNORMAL HIGH (ref 0–376)

## 2022-10-31 LAB — TSH: TSH: 0.894 u[IU]/mL (ref 0.450–4.500)

## 2022-10-31 MED ORDER — FUROSEMIDE 20 MG PO TABS: 40.0000 mg | ORAL_TABLET | Freq: Every day | ORAL | Status: DC

## 2022-10-31 NOTE — Addendum Note (Signed)
Addended by: Bertram Millard on: 10/31/2022 08:45 AM   Modules accepted: Orders

## 2022-10-31 NOTE — Telephone Encounter (Signed)
I spoke with the pt and his wife and they verbalized understanding of his lab results and Logan Bores' recommendations.   [facility] is working on getting his echo precerted and moved up from 11/15/22.

## 2022-10-31 NOTE — Telephone Encounter (Signed)
-----   Message from Perlie Gold sent at 10/31/2022  7:58 AM EDT ----- Labs are consistent with vascular congestion and volume overload/heart failure. Kidney function is stable. Normal thyroid function. I would like to increase Lasix to 40mg  daily with repeat BMP in one week. I'd like to overbook myself on 11/11 @9 :40am to ensure close follow up.  Staff: If possible, would like to expedite his echo.

## 2022-11-07 ENCOUNTER — Ambulatory Visit (INDEPENDENT_AMBULATORY_CARE_PROVIDER_SITE_OTHER): Payer: Medicare Other

## 2022-11-07 DIAGNOSIS — I10 Essential (primary) hypertension: Secondary | ICD-10-CM | POA: Diagnosis not present

## 2022-11-07 LAB — ECHOCARDIOGRAM COMPLETE
AR max vel: 3.27 cm2
AV Area VTI: 2.99 cm2
AV Area mean vel: 3.04 cm2
AV Mean grad: 7 mm[Hg]
AV Peak grad: 14 mm[Hg]
Ao pk vel: 1.87 m/s
Area-P 1/2: 2.73 cm2
MV M vel: 5.17 m/s
MV Peak grad: 106.9 mm[Hg]
S' Lateral: 1.83 cm

## 2022-11-12 ENCOUNTER — Encounter: Payer: Self-pay | Admitting: Cardiology

## 2022-11-12 ENCOUNTER — Ambulatory Visit: Payer: Medicare Other | Attending: Cardiology | Admitting: Cardiology

## 2022-11-12 VITALS — BP 114/46 | HR 76 | Resp 16 | Ht 66.0 in | Wt 229.2 lb

## 2022-11-12 DIAGNOSIS — I340 Nonrheumatic mitral (valve) insufficiency: Secondary | ICD-10-CM | POA: Insufficient documentation

## 2022-11-12 DIAGNOSIS — I5031 Acute diastolic (congestive) heart failure: Secondary | ICD-10-CM | POA: Diagnosis not present

## 2022-11-12 DIAGNOSIS — J42 Unspecified chronic bronchitis: Secondary | ICD-10-CM | POA: Diagnosis not present

## 2022-11-12 DIAGNOSIS — E782 Mixed hyperlipidemia: Secondary | ICD-10-CM | POA: Diagnosis not present

## 2022-11-12 DIAGNOSIS — R7989 Other specified abnormal findings of blood chemistry: Secondary | ICD-10-CM | POA: Diagnosis not present

## 2022-11-12 DIAGNOSIS — I350 Nonrheumatic aortic (valve) stenosis: Secondary | ICD-10-CM | POA: Diagnosis not present

## 2022-11-12 DIAGNOSIS — I10 Essential (primary) hypertension: Secondary | ICD-10-CM | POA: Insufficient documentation

## 2022-11-12 HISTORY — DX: Acute diastolic (congestive) heart failure: I50.31

## 2022-11-12 MED ORDER — EMPAGLIFLOZIN 10 MG PO TABS: 10.0000 mg | ORAL_TABLET | Freq: Every day | ORAL | 3 refills | Status: DC

## 2022-11-12 NOTE — Progress Notes (Signed)
Cardiology Office Note:   Date:  11/12/2022  ID:  Cody Tate, DOB 04-Jan-1950, MRN [id number] PCP: Emilio Aspen, MD  [facility] Providers Cardiologist:  Charlton Haws, MD    History of Present Illness:   Discussed the use of AI scribe software for clinical note transcription with the patient, who gave verbal consent to proceed.  History of Present Illness   The patient is a 72 year old individual with a complex medical history including hypertension, COPD, hyperlipidemia, chronic lower extremity edema (left greater than right), mild aortic stenosis, chronic back pain managed with a nerve stimulator implant, prostate cancer, and macular degeneration. The patient was last seen on October 29th, at which time he reported weight gain and worsening lower extremity edema suggestive of fluid retention. An echocardiogram was performed, revealing normal ejection fraction and grade one diastolic dysfunction. BNP was elevated and patient lasix increased to 40mg  daily.  Since this last visit, patient believes his fluid levels have improved. The patient's weight has also decreased slightly since the last visit. The patient reports lessened severity of chronic lower extremity edema, with the left leg still being more affected.  The patient continues with lower energy levels, and he experiences shortness of breath, particularly in the mornings. This shortness of breath is exacerbated by physical activity but interestingly improves by the afternoon. The patient also reports urinary urgency and occasional accidents, which he attributes to the Lasix. He has a history of urinary blockage and has undergone surgeries to address this issue. The patient reports occasional dizziness and lightheadedness, particularly when moving around.  The patient has a history of COPD and is on daily inhaler therapy (Trelegy), which he uses in the mornings. As noted above, patient reports that his breathing is worse  in the mornings and improves later in the day.   The patient's primary care provider is Dr. Orson Aloe, who he last saw in September. The patient also has a history of low iron levels and has received iron infusions in the past.      Studies Reviewed:    11/07/22 TTE  IMPRESSIONS     1. Left ventricular ejection fraction, by estimation, is 70 to 75%. The  left ventricle has hyperdynamic function. The left ventricle has no  regional wall motion abnormalities. There is mild left ventricular  hypertrophy. Left ventricular diastolic  parameters are consistent with Grade I diastolic dysfunction (impaired  relaxation). The average left ventricular global longitudinal strain is  -17.8 %. The global longitudinal strain is normal.   2. Right ventricular systolic function is normal. The right ventricular  size is normal.   3. The mitral valve is normal in structure. No evidence of mitral valve  regurgitation. No evidence of mitral stenosis.   4. The aortic valve is tricuspid. There is moderate calcification of the  aortic valve. There is mild thickening of the aortic valve. Aortic valve  regurgitation is not visualized. Aortic valve sclerosis is present, with  no evidence of aortic valve  stenosis. Aortic valve mean gradient measures 7.0 mmHg. Aortic valve Vmax  measures 1.87 m/s.   5. The inferior vena cava is normal in size with greater than 50%  respiratory variability, suggesting right atrial pressure of 3 mmHg.   FINDINGS   Left Ventricle: Left ventricular ejection fraction, by estimation, is 70  to 75%. The left ventricle has hyperdynamic function. The left ventricle  has no regional wall motion abnormalities. The average left ventricular  global longitudinal strain is -17.8   %.  The global longitudinal strain is normal. The left ventricular  internal cavity size was normal in size. There is mild left ventricular  hypertrophy. Left ventricular diastolic parameters are consistent  with  Grade I diastolic dysfunction (impaired  relaxation).   Right Ventricle: The right ventricular size is normal. No increase in  right ventricular wall thickness. Right ventricular systolic function is  normal.   Left Atrium: Left atrial size was normal in size.   Right Atrium: Right atrial size was normal in size.   Pericardium: There is no evidence of pericardial effusion.   Mitral Valve: The mitral valve is normal in structure. No evidence of  mitral valve regurgitation. No evidence of mitral valve stenosis.   Tricuspid Valve: The tricuspid valve is normal in structure. Tricuspid  valve regurgitation is not demonstrated. No evidence of tricuspid  stenosis.   Aortic Valve: The aortic valve is tricuspid. There is moderate  calcification of the aortic valve. There is mild thickening of the aortic  valve. Aortic valve regurgitation is not visualized. Aortic valve  sclerosis is present, with no evidence of aortic  valve stenosis. Aortic valve mean gradient measures 7.0 mmHg. Aortic valve  peak gradient measures 14.0 mmHg. Aortic valve area, by VTI measures 2.99  cm.   Pulmonic Valve: The pulmonic valve was normal in structure. Pulmonic valve  regurgitation is not visualized. No evidence of pulmonic stenosis.   Aorta: The aortic root is normal in size and structure.   Venous: The inferior vena cava is normal in size with greater than 50%  respiratory variability, suggesting right atrial pressure of 3 mmHg.   IAS/Shunts: No atrial level shunt detected by color flow Doppler.   Risk Assessment/Calculations:              Physical Exam:   VS:  BP (!) 114/46 (BP Location: Left Arm, Patient Position: Sitting, Cuff Size: Large)   Pulse 76   Resp 16   Ht 5\' 6"  (1.676 m)   Wt 229 lb 3.2 oz (104 kg)   SpO2 94%   BMI 36.99 kg/m    Wt Readings from Last 3 Encounters:  11/12/22 229 lb 3.2 oz (104 kg)  10/30/22 234 lb 3.2 oz (106.2 kg)  10/01/22 231 lb 9.6 oz (105.1 kg)      Physical Exam Vitals reviewed.  Constitutional:      Appearance: Normal appearance.  HENT:     Head: Normocephalic.  Eyes:     Pupils: Pupils are equal, round, and reactive to light.  Cardiovascular:     Rate and Rhythm: Normal rate and regular rhythm.     Pulses: Normal pulses.     Heart sounds: Murmur (RUSB) heard.  Pulmonary:     Effort: Pulmonary effort is normal.     Comments: Distance breath sounds lower lobes. Faint rhonchi. No rales/crackles.  Abdominal:     General: Abdomen is flat.     Palpations: Abdomen is soft.  Musculoskeletal:     Right lower leg: Edema (trace to 1+) present.     Left lower leg: Edema (1-2+) present.  Skin:    General: Skin is warm and dry.     Capillary Refill: Capillary refill takes less than 2 seconds.  Neurological:     General: No focal deficit present.     Mental Status: He is alert and oriented to person, place, and time.  Psychiatric:        Mood and Affect: Mood normal.  Behavior: Behavior normal.        Thought Content: Thought content normal.        Judgment: Judgment normal.    ASSESSMENT AND PLAN:     Assessment and Plan    Chronic Lower Extremity Edema Diastolic CHF Chronic lower extremity edema, left greater than right, with recent worsening, now improved with increased Lasix dosing. Weight also down. Echocardiogram shows normal ejection fraction, grade one diastolic dysfunction, no significant valvular abnormalities, or pulmonary hypertension. Discussed Jardiance benefits (reduced Lasix need, heart and kidney protection) and risks (cost, side effects). - Continue Lasix 40 mg daily - Continue Spironolactone 12.5mg  - Start Jardiance 10mg . Would consider decreasing Lasix dosing if patient able to afford.  - Order metabolic panel and pro-BNP today - Continue daily compression stockings  Shortness of Breath COPD Shortness of breath, particularly in the mornings, improving in the afternoons. Overall better than at  last office visit. Likely multifactorial with potential COPD component. Echocardiogram does not indicate an acute cardiac abnormality. Lower lobe breath sounds distant with faint rhonchi today. Discussed need for pulmonology follow-up. - Refer to pulmonologist - Continue Trelegy inhaler daily - Monitor for pulmonary hypertension if pulmonologist deems necessary  Hypertension Blood pressure well-controlled at 114/46. Experiences dizziness and lightheadedness when standing. Discussed monitoring blood pressure at home and adjusting antihypertensive medications if dizziness persists. - Monitor blood pressure at home - Continue Metoprolol Succinate 100mg  daily, Spironolactone 12.5mg , Telmisartan 40mg  BID - Consider reducing telmisartan if dizziness persists  Hyperlipidemia LDL at 80, well-controlled with current medication. - Continue simvastatin 20 mg daily - Defer to PCP for ongoing management.   Elevated Liver Enzymes Alk phos elevated to 201 on recent CMP.  - Discuss liver enzyme results with primary care physician  Aortic stenosis Mild MR Updated TTE with no evidence of mitral regurgitation. Aortic valve with no evidence of stenosis. Peak gradient , mean gradient . VTI 2.99  Follow-up - Follow up in 2 months - Call pulmonologist for appointment - Discuss liver enzyme results with Dr. Orson Aloe (PCP).             Signed, Perlie Gold, PA-C

## 2022-11-12 NOTE — Patient Instructions (Addendum)
Medication Instructions:  Your physician has recommended you make the following change in your medication:   START Jardiance 10 mg daily before breakfast    *If you need a refill on your cardiac medications before your next appointment, please call your pharmacy*  Lab Work: BMP and Pro-BNP TODAY  If you have labs (blood work) drawn today and your tests are completely normal, you will receive your results only by: MyChart Message (if you have MyChart) OR A paper copy in the mail If you have any lab test that is abnormal or we need to change your treatment, we will call you to review the results.  Testing/Procedures: None ordered today.  Follow-Up: At [facility], you and your health needs are our priority.  As part of our continuing mission to provide you with exceptional heart care, we have created designated Provider Care Teams.  These Care Teams include your primary Cardiologist (physician) and Advanced Practice Providers (APPs -  Physician Assistants and Nurse Practitioners) who all work together to provide you with the care you need, when you need it.   Your next appointment:   2 month(s)  The format for your next appointment:   In Person  Provider:   Charlton Haws, MD {  Other Instructions Call pulmonology to follow-up  Follow-up with PCP regarding elevated liver enzymes

## 2022-11-13 ENCOUNTER — Other Ambulatory Visit: Payer: Self-pay

## 2022-11-13 ENCOUNTER — Telehealth: Payer: Self-pay

## 2022-11-13 DIAGNOSIS — E875 Hyperkalemia: Secondary | ICD-10-CM

## 2022-11-13 LAB — BASIC METABOLIC PANEL
BUN/Creatinine Ratio: 18 (ref 10–24)
BUN: 28 mg/dL — ABNORMAL HIGH (ref 8–27)
CO2: 27 mmol/L (ref 20–29)
Calcium: 9.2 mg/dL (ref 8.6–10.2)
Chloride: 96 mmol/L (ref 96–106)
Creatinine, Ser: 1.56 mg/dL — ABNORMAL HIGH (ref 0.76–1.27)
Glucose: 96 mg/dL (ref 70–99)
Potassium: 5.3 mmol/L — ABNORMAL HIGH (ref 3.5–5.2)
Sodium: 138 mmol/L (ref 134–144)
eGFR: 47 mL/min/{1.73_m2} — ABNORMAL LOW (ref >59)

## 2022-11-13 LAB — PRO B NATRIURETIC PEPTIDE: NT-Pro BNP: 227 pg/mL (ref 0–376)

## 2022-11-13 NOTE — Telephone Encounter (Signed)
Spoke with pt and pt's wife and went over the medication changes Perlie Gold, PA had made including holding spironolactone, stopping lasix, and decreasing Telmisartan to 40mg  once daily. Pt informed they need to come back Friday 11/15 to have a repeat BMP drawn after making these medication changes. Pt verbalized understanding and had no questions.

## 2022-11-14 ENCOUNTER — Ambulatory Visit: Payer: Medicare Other | Admitting: Internal Medicine

## 2022-11-14 ENCOUNTER — Telehealth: Payer: Self-pay | Admitting: Pulmonary Disease

## 2022-11-14 NOTE — Telephone Encounter (Signed)
Disregard

## 2022-11-15 ENCOUNTER — Other Ambulatory Visit (HOSPITAL_BASED_OUTPATIENT_CLINIC_OR_DEPARTMENT_OTHER): Payer: Medicare Other

## 2022-11-15 ENCOUNTER — Ambulatory Visit (INDEPENDENT_AMBULATORY_CARE_PROVIDER_SITE_OTHER): Payer: Medicare Other | Admitting: Internal Medicine

## 2022-11-15 ENCOUNTER — Encounter: Payer: Self-pay | Admitting: Internal Medicine

## 2022-11-15 VITALS — BP 174/77 | HR 67 | Temp 97.8°F | Ht 66.0 in | Wt 227.0 lb

## 2022-11-15 DIAGNOSIS — J449 Chronic obstructive pulmonary disease, unspecified: Secondary | ICD-10-CM | POA: Diagnosis not present

## 2022-11-15 DIAGNOSIS — J329 Chronic sinusitis, unspecified: Secondary | ICD-10-CM

## 2022-11-15 DIAGNOSIS — R0609 Other forms of dyspnea: Secondary | ICD-10-CM | POA: Diagnosis not present

## 2022-11-15 DIAGNOSIS — Z87891 Personal history of nicotine dependence: Secondary | ICD-10-CM | POA: Diagnosis not present

## 2022-11-15 DIAGNOSIS — J42 Unspecified chronic bronchitis: Secondary | ICD-10-CM | POA: Diagnosis not present

## 2022-11-15 LAB — CBC WITH DIFFERENTIAL/PLATELET
Basophils Absolute: 0.1 10*3/uL (ref 0.0–0.1)
Basophils Relative: 1.4 % (ref 0.0–3.0)
Eosinophils Absolute: 0.1 10*3/uL (ref 0.0–0.7)
Eosinophils Relative: 1.4 % (ref 0.0–5.0)
HCT: 42.7 % (ref 39.0–52.0)
Hemoglobin: 14.3 g/dL (ref 13.0–17.0)
Lymphocytes Relative: 8.5 % — ABNORMAL LOW (ref 12.0–46.0)
Lymphs Abs: 0.7 10*3/uL (ref 0.7–4.0)
MCHC: 33.5 g/dL (ref 30.0–36.0)
MCV: 92.3 fL (ref 78.0–100.0)
Monocytes Absolute: 0.7 10*3/uL (ref 0.1–1.0)
Monocytes Relative: 8.7 % (ref 3.0–12.0)
Neutro Abs: 6.6 10*3/uL (ref 1.4–7.7)
Neutrophils Relative %: 80 % — ABNORMAL HIGH (ref 43.0–77.0)
Platelets: 455 10*3/uL — ABNORMAL HIGH (ref 150.0–400.0)
RBC: 4.62 Mil/uL (ref 4.22–5.81)
RDW: 13.6 % (ref 11.5–15.5)
WBC: 8.2 10*3/uL (ref 4.0–10.5)

## 2022-11-15 LAB — NITRIC OXIDE: Nitric Oxide: 5

## 2022-11-15 LAB — BRAIN NATRIURETIC PEPTIDE: Pro B Natriuretic peptide (BNP): 119 pg/mL — ABNORMAL HIGH (ref 0.0–100.0)

## 2022-11-15 NOTE — Patient Instructions (Addendum)
ICD-10-CM   1. DOE (dyspnea on exertion)  R06.09     2. Chronic bronchitis, unspecified chronic bronchitis type (HCC)  J42 Nitric oxide    3. Chronic congestion of paranasal sinus  J32.9     4. Stopped smoking with greater than 40 pack year history  Z87.891     5. Stage 2 moderate COPD by GOLD classification (HCC)  J44.9       Features are chronic. Need to rule out COPD worsening or development of ILD and chronic sinus congestion Noted that you want to switch and transfer care to Dr. Marchelle Gearing  Plan  - Check CBC differential and RAST allergy panel - Check alpha-1 antitrypsin - Check BNP - Check QuantiFERON gold -Check high-resolution CT chest supine and prone - Check CT scan of the sinus without contrast -Check overnight pulse oximetry on room air  Follow-up - Return to see Dr. Marchelle Gearing or nurse practitioner in 4-8  weeks for discussing results

## 2022-11-15 NOTE — Progress Notes (Signed)
OV 11/15/2022  Subjective:  Patient ID: Cody Tate, male , DOB: 08-16-1950 , age 72 y.o. , MRN: [id number] , ADDRESS: [address] [place] [address] PCP Emilio Aspen, MD Patient Care Team: Emilio Aspen, MD as PCP - General (Internal Medicine) Wendall Stade, MD as PCP - Cardiology (Cardiology) Noel Christmas, MD as Consulting Physician (Urology) Margaretmary Dys, MD as Consulting Physician (Radiation Oncology) Axel Filler, Larna Daughters, NP as Nurse Practitioner (Hematology and Oncology) Maryclare Labrador, RN as Registered Nurse  This Provider for this visit: Treatment Team:  Attending Provider: Kalman Shan, MD    11/15/2022 -   Chief Complaint  Patient presents with   Acute Visit    Increased SOB over the past 2 months. He states his breathing is worse esp when he wakes up in the am.      HPI Cody Tate 72 y.o. -follow-up 5 2 pack smoker.  Acute visit.  He does not have a chest x-ray or CT scan for more than 5 years in the health system here he is here for an acute visit.  But is a transfer of care from Dr. Judeth Horn to Dr. Marchelle Gearing.  This is because his close friend was Mr. Eliseo Squires who was my patient with IPF and who subsequently died from it.  He is here with his wife.  He tells me that it was booked as an acute visit because he really thought he had an appointment today but he did not have an appointment.  He tells me that he has had chronic sinus congestion going on for several years.  His sputum is white or yellow because of the sinus drainage.  But he also has chronic shortness of breath insidious onset getting worse since spring 2024.  Particularly after some cardiac medication was changed.  No associated chest pain.  He has had detailed cardiac workup.  His last imaging was many years ago of the chest.  But he is a past heavy smoker.   He has significant obesity.  He was in the [employer]  He has a long back scar from  surgery.  This extends into the thoracic lumbar and sacral spine.   Feno 5 ppbc   Cardiac stress test January 2020 -- Nuclear stress EF: 67%. The left ventricular ejection fraction is hyperdynamic (>65%). Baseline EKG showed NSR with inferolateral T wave inversions. No changes with Lexiscan infusions. The study is normal. This is a low risk study.  Echocardiogram November 2024 - Grade 1 diastolic dysfunction   Blood work - BNP 10/30/2022 elevated 543 and then improved to 227 in November 2024. -Normal hemoglobin March 2024  PFT -he had 1 on January 20, 2013 that shows moderate obstruction with an FEV1 of 2.2 L / 75% with a ratio of 65.      No data to display            LAB RESULTS last 96 hours No results found.  LAB RESULTS last 90 days Recent Results (from the past 2160 hour(s))  Pro b natriuretic peptide     Status: Abnormal   Collection Time: 10/30/22  3:04 PM  Result Value Ref Range   NT-Pro BNP 543 (H) 0 - 376 pg/mL    Comment: The following cut-points have been suggested for the use of proBNP for the diagnostic evaluation of heart failure (HF) in patients with acute dyspnea: Modality  Age           Optimal Cut                            (years)            Point ------------------------------------------------------ Diagnosis (rule in HF)        <50            450 pg/mL                           50 - 75            900 pg/mL                               >75           1800 pg/mL Exclusion (rule out HF)  Age independent     300 pg/mL   Comprehensive Metabolic Panel (CMET)     Status: Abnormal   Collection Time: 10/30/22  3:04 PM  Result Value Ref Range   Glucose 149 (H) 70 - 99 mg/dL   BUN 17 8 - 27 mg/dL   Creatinine, Ser 1.61 0.76 - 1.27 mg/dL   eGFR 93 >09 UE/AVW/0.98   BUN/Creatinine Ratio 21 10 - 24   Sodium 136 134 - 144 mmol/L   Potassium 4.2 3.5 - 5.2 mmol/L   Chloride 97 96 - 106 mmol/L   CO2 24 20 - 29 mmol/L   Calcium  9.2 8.6 - 10.2 mg/dL   Total Protein 5.7 (L) 6.0 - 8.5 g/dL   Albumin 3.2 (L) 3.8 - 4.8 g/dL   Globulin, Total 2.5 1.5 - 4.5 g/dL   Bilirubin Total 0.5 0.0 - 1.2 mg/dL   Alkaline Phosphatase 201 (H) 44 - 121 IU/L   AST 35 0 - 40 IU/L   ALT 33 0 - 44 IU/L  TSH     Status: None   Collection Time: 10/30/22  3:04 PM  Result Value Ref Range   TSH 0.894 0.450 - 4.500 uIU/mL  ECHOCARDIOGRAM COMPLETE     Status: None   Collection Time: 11/07/22  2:03 PM  Result Value Ref Range   S' Lateral 1.83 cm   AV Area VTI 2.99 cm2   AV Mean grad 7.0 mmHg   AV Area mean vel 3.04 cm2   Area-P 1/2 2.73 cm2   AR max vel 3.27 cm2   AV Peak grad 14.0 mmHg   Ao pk vel 1.87 m/s   MV M vel 5.17 m/s   MV Peak grad 106.9 mmHg   Est EF 70 - 75%   Pro b natriuretic peptide (BNP)     Status: None   Collection Time: 11/12/22 10:35 AM  Result Value Ref Range   NT-Pro BNP 227 0 - 376 pg/mL    Comment: The following cut-points have been suggested for the use of proBNP for the diagnostic evaluation of heart failure (HF) in patients with acute dyspnea: Modality                     Age           Optimal Cut                            (years)  Point ------------------------------------------------------ Diagnosis (rule in HF)        <50            450 pg/mL                           50 - 75            900 pg/mL                               >75           1800 pg/mL Exclusion (rule out HF)  Age independent     300 pg/mL   Basic metabolic panel     Status: Abnormal   Collection Time: 11/12/22 10:35 AM  Result Value Ref Range   Glucose 96 70 - 99 mg/dL   BUN 28 (H) 8 - 27 mg/dL   Creatinine, Ser 8.29 (H) 0.76 - 1.27 mg/dL   eGFR 47 (L) >56 OZ/HYQ/6.57   BUN/Creatinine Ratio 18 10 - 24   Sodium 138 134 - 144 mmol/L   Potassium 5.3 (H) 3.5 - 5.2 mmol/L   Chloride 96 96 - 106 mmol/L   CO2 27 20 - 29 mmol/L   Calcium 9.2 8.6 - 10.2 mg/dL  Nitric oxide     Status: None   Collection Time: 11/15/22   1:51 PM  Result Value Ref Range   Nitric Oxide 5          has a past medical history of Arthritis, Back pain, BPH (benign prostatic hyperplasia), Cancer (HCC) (2022), COPD (chronic obstructive pulmonary disease) (HCC), Dyspnea, Enlarged prostate, GERD (gastroesophageal reflux disease), Glaucoma, H/O hiatal hernia, H/O wheezing, Heart murmur, Hypertension, Lumbar foraminal stenosis, Macular degeneration, Meningioma (HCC) (2014), Neuromuscular disorder (HCC), PONV (postoperative nausea and vomiting), Pulmonary nodules, and S/P insertion of spinal cord stimulator.   reports that he quit smoking about 22 years ago. His smoking use included cigarettes. He started smoking about 57 years ago. He has a 52.5 pack-year smoking history. He has never used smokeless tobacco.  Past Surgical History:  Procedure Laterality Date   ABDOMINAL EXPOSURE N/A 03/16/2022   Procedure: ABDOMINAL EXPOSURE;  Surgeon: Cephus Shelling, MD;  Location: [facility] OR;  Service: Vascular;  Laterality: N/A;   ANTERIOR LUMBAR FUSION N/A 03/16/2022   Procedure: Anterior Lumbar Interbody Fusion - Lumbar Five-Sacral One with cage;  Surgeon: Julio Sicks, MD;  Location: [facility] OR;  Service: Neurosurgery;  Laterality: N/A;   BACK SURGERY  06/08/2006   lam x3 cerv x 1   CERVICAL FUSION     COLONOSCOPY  2020   CYSTOSCOPY     72 yrs old   CYSTOSCOPY WITH URETHRAL DILATATION N/A 09/21/2021   Procedure: CYSTOSCOPY WITH BALLOON DILATION and OPTILUME BALLOON DILATION OF  URETHRAL DILATATION;  Surgeon: Noel Christmas, MD;  Location: [facility] ORS;  Service: Urology;  Laterality: N/A;   EYE SURGERY     bil   GOLD SEED IMPLANT N/A 09/06/2020   Procedure: GOLD SEED IMPLANT/ PLACEMENT OF FIDUCIAL MARKERS;  Surgeon: Noel Christmas, MD;  Location: [facility] ORS;  Service: Urology;  Laterality: N/A;   KNEE ARTHROSCOPY  01/02/2008   lft   KNEE ARTHROSCOPY Left 07/23/2012   Procedure: LEFT MEDIAL MENISCAL DEBRIDEMENT AND CHONDROPLASTY;  Surgeon: Loanne Drilling, MD;  Location: [facility] ORS;  Service: Orthopedics;  Laterality: Left;   LUMBAR FUSION  02/22/2014  DR POOL   PILONIDAL CYST EXCISION     SINUS EXPLORATION     SPACE OAR INSTILLATION N/A 09/06/2020   Procedure: SPACE OAR INSTILLATION;  Surgeon: Noel Christmas, MD;  Location: [facility] ORS;  Service: Urology;  Laterality: N/A;   SPINAL CORD STIMULATOR INSERTION N/A 01/16/2018   Procedure: LUMBAR SPINAL CORD STIMULATOR INSERTION;  Surgeon: Tia Alert, MD;  Location: [facility] OR;  Service: Neurosurgery;  Laterality: N/A;  LUMBAR SPINAL CORD STIMULATOR INSERTION   TRANSURETHRAL RESECTION OF PROSTATE N/A 09/06/2020   Procedure: TRANSURETHRAL RESECTION OF THE PROSTATE (TURP);  Surgeon: Noel Christmas, MD;  Location: [facility] ORS;  Service: Urology;  Laterality: N/A;  2 HRS   WRIST GANGLION EXCISION     rt   WRIST SURGERY  01/02/2007   cyst lft    Allergies  Allergen Reactions   Other Itching, Rash and Other (See Comments)    VICRYL Sutures   Tape Itching, Other (See Comments) and Rash    Occlusive tape and bandaids    Immunization History  Administered Date(s) Administered   DTP 06/01/2001   Fluad Quad(high Dose 65+) 09/16/2019   Influenza Split 10/16/2010, 10/08/2012, 10/24/2012, 10/20/2013   Influenza, High Dose Seasonal PF 10/22/2014, 09/07/2015   Influenza-Unspecified 10/03/2018, 11/02/2022   PFIZER(Purple Top)SARS-COV-2 Vaccination 02/13/2019, 03/10/2019, 10/05/2019, 04/19/2020   Pfizer Covid-19 Vaccine Bivalent Booster 44yrs & up 09/29/2020   Pneumococcal Conjugate-13 10/22/2014   Pneumococcal Polysaccharide-23 02/10/2015   Tdap 09/18/2011, 05/17/2012   Zoster Recombinant(Shingrix) 08/18/2018   Zoster, Live 08/04/2010    Family History  Problem Relation Age of Onset   Hypertension Mother    Bone cancer Mother    Stroke Father 52   COPD Maternal Uncle      Current Outpatient Medications:    Ascorbic Acid (VITAMIN C) 1000 MG tablet, Take 1,000 mg by mouth daily., Disp: ,  Rfl:    celecoxib (CELEBREX) 200 MG capsule, TAKE 1 TO 2 CAPSULES BY MOUTH DAILY WITH FOOD for 90, Disp: , Rfl:    chlorhexidine (HIBICLENS) 4 % external liquid, Apply 1 Application topically daily as needed (boils)., Disp: , Rfl:    Cholecalciferol (VITAMIN D3) 50 MCG (2000 UT) capsule, Take 4,000 Units by mouth daily., Disp: , Rfl:    diphenhydrAMINE (BENADRYL ALLERGY) 25 MG tablet, Take 25 mg by mouth at bedtime., Disp: , Rfl:    empagliflozin (JARDIANCE) 10 MG TABS tablet, Take 1 tablet (10 mg total) by mouth daily before breakfast., Disp: 30 tablet, Rfl: 3   finasteride (PROSCAR) 5 MG tablet, Take 5 mg by mouth daily., Disp: , Rfl:    fluticasone (FLONASE) 50 MCG/ACT nasal spray, Place 2 sprays into both nostrils daily. , Disp: , Rfl:    gabapentin (NEURONTIN) 300 MG capsule, Take 300 mg by mouth 4 (four) times daily., Disp: , Rfl:    Guaifenesin (MUCINEX MAXIMUM STRENGTH) 1200 MG TB12, Take 1,200 mg by mouth 2 (two) times daily., Disp: , Rfl:    ibuprofen (ADVIL,MOTRIN) 200 MG tablet, Take 400 mg by mouth 4 (four) times daily as needed for moderate pain., Disp: , Rfl:    Ipratropium-Albuterol (COMBIVENT RESPIMAT) 20-100 MCG/ACT AERS respimat, Inhale 1 puff into the lungs every 6 (six) hours as needed for wheezing or shortness of breath., Disp: 12 g, Rfl: 3   latanoprost (XALATAN) 0.005 % ophthalmic solution, Place 1 drop into both eyes at bedtime. , Disp: , Rfl:    Lutein-Zeaxanthin 25-5 MG CAPS, Take by mouth., Disp: , Rfl:  magnesium hydroxide (MILK OF MAGNESIA) 400 MG/5ML suspension, Take 15 mLs by mouth at bedtime., Disp: , Rfl:    metoprolol succinate (TOPROL-XL) 100 MG 24 hr tablet, Take 100 mg by mouth at bedtime. Take with or immediately following a meal., Disp: , Rfl:    montelukast (SINGULAIR) 10 MG tablet, TAKE 1 TABLET BY MOUTH AT  BEDTIME, Disp: 90 tablet, Rfl: 3   Multiple Vitamin (MULTIVITAMIN WITH MINERALS) TABS tablet, Take 1 tablet by mouth daily., Disp: , Rfl:    Multiple  Vitamins-Minerals (PRESERVISION AREDS 2+MULTI VIT) CAPS, Take 1 capsule by mouth in the morning and at bedtime., Disp: , Rfl:    omeprazole (PRILOSEC) 40 MG capsule, Take 1 capsule (40 mg total) by mouth daily before breakfast., Disp: 90 capsule, Rfl: 3   Oxycodone HCl 10 MG TABS, Take 10 mg by mouth every 4 (four) hours as needed (pain)., Disp: , Rfl:    oxymetazoline (AFRIN) 0.05 % nasal spray, Place 2 sprays into both nostrils 2 (two) times daily as needed for congestion., Disp: , Rfl:    Propylene Glycol, PF, (SYSTANE COMPLETE PF) 0.6 % SOLN, Place 1 drop into both eyes 3 (three) times daily as needed (dry eyes)., Disp: , Rfl:    pseudoephedrine (SUDAFED) 30 MG tablet, Take 60 mg by mouth 3 (three) times daily., Disp: , Rfl:    Simethicone 125 MG CAPS, Take 250 mg by mouth 4 (four) times daily as needed (for gas)., Disp: , Rfl:    simvastatin (ZOCOR) 20 MG tablet, Take 20 mg by mouth at bedtime. , Disp: , Rfl:    sodium chloride (OCEAN) 0.65 % SOLN nasal spray, Place 1 spray into both nostrils as needed for congestion. As needed, Disp: , Rfl:    spironolactone (ALDACTONE) 25 MG tablet, Take 12.5 mg by mouth at bedtime. , Disp: , Rfl:    Tamsulosin HCl (FLOMAX) 0.4 MG CAPS, Take 0.4 mg by mouth 2 (two) times daily., Disp: , Rfl:    telmisartan (MICARDIS) 40 MG tablet, Take 40 mg by mouth daily., Disp: , Rfl:    timolol (TIMOPTIC) 0.5 % ophthalmic solution, 1 drop every morning., Disp: , Rfl:    TRELEGY ELLIPTA 200-62.5-25 MCG/ACT AEPB, USE 1 INHALATION BY MOUTH ONCE  DAILY AT THE SAME TIME EACH DAY, Disp: 180 each, Rfl: 3      Objective:   Vitals:   11/15/22 1337 11/15/22 1338  BP: (!) 180/79 (!) 174/77  Pulse: 67   Temp: 97.8 F (36.6 C)   TempSrc: Temporal   SpO2: 99%   Weight: 227 lb (103 kg)   Height: 5\' 6"  (1.676 m)     Estimated body mass index is 36.64 kg/m as calculated from the following:   Height as of this encounter: 5\' 6"  (1.676 m).   Weight as of this encounter: 227  lb (103 kg).  @WEIGHTCHANGE @  Filed Weights   11/15/22 1337  Weight: 227 lb (103 kg)     Physical Exam   General: No distress. obse O2 at rest: no Cane present: no Sitting in wheel chair: no. HAS WALKER.  Frail: no Obese: YES Neuro: Alert and Oriented x 3. GCS 15. Speech normal Psych: Pleasant Resp:  Barrel Chest - no.  Wheeze - no, Crackles - no, No overt respiratory distress CVS: Normal heart sounds. Murmurs - no Ext: Stigmata of Connective Tissue Disease - no HEENT: Normal upper airway. PEERL +. No post nasal drip        Assessment:  ICD-10-CM   1. DOE (dyspnea on exertion)  R06.09 B Nat Peptide    2. Chronic bronchitis, unspecified chronic bronchitis type (HCC)  J42 Nitric oxide    B Nat Peptide    3. Chronic congestion of paranasal sinus  J32.9     4. Stopped smoking with greater than 40 pack year history  Z87.891     5. 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 DOE (dyspnea on exertion)  R06.09     2. Chronic bronchitis, unspecified chronic bronchitis type (HCC)  J42 Nitric oxide    3. Chronic congestion of paranasal sinus  J32.9     4. Stopped smoking with greater than 40 pack year history  Z87.891     5. Stage 2 moderate COPD by GOLD classification (HCC)  J44.9       Features are chronic. Need to rule out COPD worsening or development of ILD and chronic sinus congestion Noted that you want to switch and transfer care to Dr. Marchelle Gearing  Plan  - Check CBC differential and RAST allergy panel - Check alpha-1 antitrypsin - Check BNP - Check QuantiFERON gold -Check high-resolution CT chest supine and prone - Check CT scan of the sinus without contrast -Check overnight pulse oximetry on room air  Follow-up - Return to see Dr. Marchelle Gearing or nurse practitioner in 4-8  weeks for discussing results    FOLLOWUP Return in about 6 weeks (around 12/27/2022) for 15  min visit, Face to Face Visit, with Dr Marchelle Gearing.    SIGNATURE    Dr. Kalman Shan, M.D., F.C.C.P,  Pulmonary and Critical Care Medicine Staff Physician, [facility] Director - Interstitial Lung Disease  Program  Pulmonary Fibrosis [facility] at [facility] [place], [place], [address]  Pager: [phone], If no answer or between  15:00h - 7:00h: call [phone] Telephone: [phone]  2:13 PM 11/15/2022

## 2022-11-15 NOTE — Addendum Note (Signed)
Addended by: Christen Butter on: 11/15/2022 02:22 PM   Modules accepted: Orders

## 2022-11-16 DIAGNOSIS — E875 Hyperkalemia: Secondary | ICD-10-CM | POA: Diagnosis not present

## 2022-11-17 LAB — BASIC METABOLIC PANEL
BUN/Creatinine Ratio: 22 (ref 10–24)
BUN: 19 mg/dL (ref 8–27)
CO2: 23 mmol/L (ref 20–29)
Calcium: 9.7 mg/dL (ref 8.6–10.2)
Chloride: 101 mmol/L (ref 96–106)
Creatinine, Ser: 0.86 mg/dL (ref 0.76–1.27)
Glucose: 103 mg/dL — ABNORMAL HIGH (ref 70–99)
Potassium: 5 mmol/L (ref 3.5–5.2)
Sodium: 139 mmol/L (ref 134–144)
eGFR: 91 mL/min/{1.73_m2} (ref >59)

## 2022-11-19 ENCOUNTER — Telehealth: Payer: Self-pay | Admitting: Cardiology

## 2022-11-19 DIAGNOSIS — I10 Essential (primary) hypertension: Secondary | ICD-10-CM

## 2022-11-19 DIAGNOSIS — Z79899 Other long term (current) drug therapy: Secondary | ICD-10-CM

## 2022-11-19 MED ORDER — FUROSEMIDE 20 MG PO TABS: 20.0000 mg | ORAL_TABLET | Freq: Every day | ORAL | Status: DC

## 2022-11-19 NOTE — Telephone Encounter (Signed)
Spoke with patient and discussed lab results.  Per Perlie Gold, PA-C: Renal function now back to normal/baseline creatinine. Would recommend we resume Lasix 20mg  daily. Continue Telmisartan 40mg  once daily. Continue to hold Spironolactone for now with potassium still at the high end of normal. Please enquire about whether he was able to pick up Jardiance. I would like to repeat a BMP once again in a week. We should also see in back in clinic in a month.   Patient states he has Lasix 20 mg available and does not need Rx sent in at this time. He started the Jardiance on Wednesday 11/14/22.   BMP ordered, order released. Patient will come to lab on 11/28/22.  Appt with Ronie Spies, PA-C scheduled for 01/17/23 for 1 month F/U. This was the soonest available.  Patient verbalized understanding of the above and expressed appreciation for call. Patient and his wife also would like to pass along a thank you to [facility] for all he has done to help patient.

## 2022-11-19 NOTE — Telephone Encounter (Signed)
-----   Message from Perlie Gold sent at 11/19/2022 11:01 AM EST ----- Renal function now back to normal/baseline creatinine. Would recommend we resume Lasix 20mg  daily. Continue Telmisartan 40mg  once daily. Continue to hold Spironolactone for now with potassium still at the high end of normal. Please enquire about whether he was able to pick up Jardiance. I would like to repeat a BMP once again in a week. We should also see in back in clinic in a month.

## 2022-11-21 NOTE — Progress Notes (Signed)
Mild grass allergy. Cody Tate will discuss with you

## 2022-11-23 LAB — RESPIRATORY ALLERGY PROFILE REGION II ~~LOC~~
Allergen, A. alternata, m6: <0.1 kU/L
Allergen, Cedar tree, t12: <0.1 kU/L
Allergen, Comm Silver Birch, t9: <0.1 kU/L
Allergen, Cottonwood, t14: <0.1 kU/L
Allergen, D pternoyssinus,d7: <0.1 kU/L
Allergen, Mouse Urine Protein, e78: <0.1 kU/L
Allergen, Mulberry, t76: <0.1 kU/L
Allergen, Oak,t7: <0.1 kU/L
Allergen, P. notatum, m1: <0.1 kU/L
Aspergillus fumigatus, m3: <0.1 kU/L
Bermuda Grass: 0.43 kU/L — ABNORMAL HIGH
Box Elder IgE: <0.1 kU/L
CLADOSPORIUM HERBARUM (M2) IGE: <0.1 kU/L
COMMON RAGWEED (SHORT) (W1) IGE: <0.1 kU/L
Cat Dander: <0.1 kU/L
Class: 0
Class: 0
Class: 0
Class: 0
Class: 0
Class: 0
Class: 0
Class: 0
Class: 0
Class: 0
Class: 0
Class: 0
Class: 0
Class: 0
Class: 0
Class: 0
Class: 0
Class: 0
Class: 0
Class: 0
Class: 0
Class: 1
Class: 2
Cockroach: <0.1 kU/L
D. farinae: <0.1 kU/L
Dog Dander: <0.1 kU/L
Elm IgE: <0.1 kU/L
IgE (Immunoglobulin E), Serum: 91 kU/L (ref <=114)
Johnson Grass: 0.34 kU/L — ABNORMAL HIGH
Pecan/Hickory Tree IgE: <0.1 kU/L
Rough Pigweed  IgE: <0.1 kU/L
Sheep Sorrel IgE: <0.1 kU/L
Timothy Grass: 1.56 kU/L — ABNORMAL HIGH

## 2022-11-23 LAB — QUANTIFERON-TB GOLD PLUS
Mitogen-NIL: 2.13 [IU]/mL
NIL: 0.01 [IU]/mL
QuantiFERON-TB Gold Plus: NEGATIVE
TB1-NIL: 0 [IU]/mL
TB2-NIL: 0 [IU]/mL

## 2022-11-23 LAB — ALPHA-1 ANTITRYPSIN PHENOTYPE: A-1 Antitrypsin, Ser: 183 mg/dL (ref 83–199)

## 2022-11-23 LAB — INTERPRETATION:

## 2022-11-27 ENCOUNTER — Ambulatory Visit: Payer: Medicare Other | Admitting: Cardiology

## 2022-11-28 ENCOUNTER — Ambulatory Visit
Admission: RE | Admit: 2022-11-28 | Discharge: 2022-11-28 | Disposition: A | Discharge status: Home / Self Care | Payer: Medicare Other | Source: Ambulatory Visit | Attending: Internal Medicine | Admitting: Internal Medicine

## 2022-11-28 DIAGNOSIS — R0609 Other forms of dyspnea: Secondary | ICD-10-CM

## 2022-11-28 DIAGNOSIS — J449 Chronic obstructive pulmonary disease, unspecified: Secondary | ICD-10-CM

## 2022-11-28 DIAGNOSIS — J42 Unspecified chronic bronchitis: Secondary | ICD-10-CM

## 2022-11-28 DIAGNOSIS — Z87891 Personal history of nicotine dependence: Secondary | ICD-10-CM

## 2022-11-28 DIAGNOSIS — I10 Essential (primary) hypertension: Secondary | ICD-10-CM | POA: Diagnosis not present

## 2022-11-28 DIAGNOSIS — I2510 Atherosclerotic heart disease of native coronary artery without angina pectoris: Secondary | ICD-10-CM | POA: Diagnosis not present

## 2022-11-28 DIAGNOSIS — J329 Chronic sinusitis, unspecified: Secondary | ICD-10-CM

## 2022-11-28 DIAGNOSIS — Z79899 Other long term (current) drug therapy: Secondary | ICD-10-CM | POA: Diagnosis not present

## 2022-11-28 DIAGNOSIS — J984 Other disorders of lung: Secondary | ICD-10-CM | POA: Diagnosis not present

## 2022-11-28 DIAGNOSIS — J439 Emphysema, unspecified: Secondary | ICD-10-CM | POA: Diagnosis not present

## 2022-11-29 LAB — BASIC METABOLIC PANEL
BUN/Creatinine Ratio: 21 (ref 10–24)
BUN: 18 mg/dL (ref 8–27)
CO2: 23 mmol/L (ref 20–29)
Calcium: 9.7 mg/dL (ref 8.6–10.2)
Chloride: 100 mmol/L (ref 96–106)
Creatinine, Ser: 0.85 mg/dL (ref 0.76–1.27)
Glucose: 91 mg/dL (ref 70–99)
Potassium: 5 mmol/L (ref 3.5–5.2)
Sodium: 140 mmol/L (ref 134–144)
eGFR: 92 mL/min/{1.73_m2} (ref >59)

## 2022-12-05 DIAGNOSIS — M48061 Spinal stenosis, lumbar region without neurogenic claudication: Secondary | ICD-10-CM | POA: Diagnosis not present

## 2022-12-17 ENCOUNTER — Telehealth: Payer: Self-pay | Admitting: Internal Medicine

## 2022-12-17 DIAGNOSIS — J984 Other disorders of lung: Secondary | ICD-10-CM

## 2022-12-17 DIAGNOSIS — R9389 Abnormal findings on diagnostic imaging of other specified body structures: Secondary | ICD-10-CM

## 2022-12-17 NOTE — Telephone Encounter (Signed)
Call report for [email or web address].CT from November 27th,2024

## 2022-12-17 NOTE — Telephone Encounter (Signed)
HRCT 11/28/22  IMPRESSION: 1. Thick-walled cavitary masslike consolidation in the perihilar right upper and right lower lobes with peribronchovascular ground-glass nodularity throughout the right, findings which may be due to pneumonia or bronchogenic carcinoma. Recommend follow-up CT chest with contrast in 3-4 weeks from today's study in further evaluation. These results will be called to the ordering clinician or representative by the Radiologist Assistant, and communication documented in the PACS or [employer]. 2. No evidence of interstitial lung disease. 3. Cirrhosis. 4. Aortic atherosclerosis (ICD10-I70.0). Coronary artery calcification. 5.  Emphysema (ICD10-J43.9).     Electronically Signed   By: Leanna Battles M.D.   On: 12/16/2022 16:08  Routing to MR

## 2022-12-17 NOTE — Telephone Encounter (Signed)
Pls let him know there eis a cavity. Pleaes   A) have him to do PET scan next week or two B) keep appt iwht APP in Jan 2025 - [facility] C) do blood work for [employer], RF, CCP and ANCA and ESR

## 2022-12-17 NOTE — Telephone Encounter (Signed)
Called and spoke with patient, he had the phone on speaker and his wife was on the call as well.  I provided the results/recommendations per Dr. Marchelle Gearing.  He verbalized understanding.  Nothing further needed.

## 2022-12-17 NOTE — Addendum Note (Signed)
Addended by: Delrae Rend on: 12/17/2022 01:55 PM   Modules accepted: Orders

## 2022-12-18 ENCOUNTER — Other Ambulatory Visit: Payer: Self-pay | Admitting: Pulmonary Disease

## 2022-12-18 DIAGNOSIS — M79642 Pain in left hand: Secondary | ICD-10-CM | POA: Diagnosis not present

## 2022-12-18 DIAGNOSIS — H353231 Exudative age-related macular degeneration, bilateral, with active choroidal neovascularization: Secondary | ICD-10-CM | POA: Diagnosis not present

## 2022-12-20 DIAGNOSIS — Z79899 Other long term (current) drug therapy: Secondary | ICD-10-CM | POA: Diagnosis not present

## 2022-12-20 DIAGNOSIS — S62102D Fracture of unspecified carpal bone, left wrist, subsequent encounter for fracture with routine healing: Secondary | ICD-10-CM | POA: Diagnosis not present

## 2022-12-20 DIAGNOSIS — G894 Chronic pain syndrome: Secondary | ICD-10-CM | POA: Diagnosis not present

## 2022-12-20 DIAGNOSIS — R918 Other nonspecific abnormal finding of lung field: Secondary | ICD-10-CM | POA: Diagnosis not present

## 2022-12-20 DIAGNOSIS — I5189 Other ill-defined heart diseases: Secondary | ICD-10-CM | POA: Diagnosis not present

## 2022-12-20 DIAGNOSIS — R748 Abnormal levels of other serum enzymes: Secondary | ICD-10-CM | POA: Diagnosis not present

## 2022-12-20 DIAGNOSIS — I700 Atherosclerosis of aorta: Secondary | ICD-10-CM | POA: Diagnosis not present

## 2022-12-20 DIAGNOSIS — I10 Essential (primary) hypertension: Secondary | ICD-10-CM | POA: Diagnosis not present

## 2022-12-20 DIAGNOSIS — K22719 Barrett's esophagus with dysplasia, unspecified: Secondary | ICD-10-CM | POA: Diagnosis not present

## 2022-12-20 DIAGNOSIS — J449 Chronic obstructive pulmonary disease, unspecified: Secondary | ICD-10-CM | POA: Diagnosis not present

## 2022-12-20 DIAGNOSIS — H353231 Exudative age-related macular degeneration, bilateral, with active choroidal neovascularization: Secondary | ICD-10-CM | POA: Diagnosis not present

## 2022-12-21 ENCOUNTER — Ambulatory Visit (INDEPENDENT_AMBULATORY_CARE_PROVIDER_SITE_OTHER): Payer: Medicare Other | Admitting: Orthopedic Surgery

## 2022-12-21 ENCOUNTER — Other Ambulatory Visit: Payer: Self-pay | Admitting: Orthopedic Surgery

## 2022-12-21 ENCOUNTER — Other Ambulatory Visit (INDEPENDENT_AMBULATORY_CARE_PROVIDER_SITE_OTHER): Payer: Self-pay

## 2022-12-21 DIAGNOSIS — S52572A Other intraarticular fracture of lower end of left radius, initial encounter for closed fracture: Secondary | ICD-10-CM

## 2022-12-21 DIAGNOSIS — M25532 Pain in left wrist: Secondary | ICD-10-CM | POA: Diagnosis not present

## 2022-12-21 NOTE — Progress Notes (Signed)
Cody Tate - 72 y.o. male MRN [id number]  Date of birth: 1950-07-25  Office Visit Note: Visit Date: 12/21/2022 PCP: Emilio Aspen, MD Referred by: Emilio Aspen, *  Subjective: No chief complaint on file.  HPI: Cody Tate is a pleasant 71 y.o. male who presents today for evaluation of a left distal radius fracture sustained 5 days prior.  Injury mechanism described as a mechanical fall onto the outstretched left wrist.  He was seen initially in the urgent care setting, underwent clinical and radiographic work which showed intra-articular left distal radius fracture.  He was given close orthopedic hand follow-up.  Pertinent ROS were reviewed with the patient and found to be negative unless otherwise specified above in HPI.   Visit Reason: left distal radius fracture Duration of symptoms: 5 days Hand dominance: left Occupation:retired Diabetic: No Smoking: No Heart/Lung History: COPD Blood Thinners:  none   Assessment & Plan: Visit Diagnoses:  1. Pain in left wrist     Plan: Extensive discussion was had the patient today regarding his left distal radius fracture.  X-rays were reviewed in detail today with the patient which do show an intra-articular distal radius fracture with multiple fragments.  There is notable step-off at the joint line greater than 3 mm with a significant fracture of the ulnar corner as well.  We discussed treatment modalities ranging from conservative to surgical.  From a conservative standpoint, we discussed ongoing immobilization with casting and the normal timeline required for fracture healing.  However, we did discuss the intra-articular nature of his injury and the fact that there could be a loss of range of motion, function and possible posttraumatic arthritis.  He is quite reliant on his hands for function, given that he has significant weakness in his legs at baseline and he does use his walker regularly.  For this reason, he  would like to regain as much function that the left wrist is possible.  We discussed the option for open reduction and internal fixation of the left distal radius fracture.  Risks and benefits of the procedure were discussed, risks including but not limited to infection, bleeding, scarring, stiffness, nerve injury, tendon injury, vascular injury, hardware complication, recurrence of symptoms and need for subsequent operation.  Patient expressed understanding.  He would like to proceed with surgical fixation.  Will move forward with surgical scheduling.    Follow-up: No follow-ups on file.   Meds & Orders: No orders of the defined types were placed in this encounter.   Orders Placed This Encounter  Procedures   XR Wrist Complete Left     Procedures: No procedures performed      Clinical History: No specialty comments available.  He reports that he quit smoking about 22 years ago. His smoking use included cigarettes. He started smoking about 57 years ago. He has a 52.5 pack-year smoking history. He has never used smokeless tobacco. No results for input(s): "HGBA1C", "LABURIC" in the last 8760 hours.  Objective:   Vital Signs: There were no vitals taken for this visit.  Physical Exam  Gen: Well-appearing, in no acute distress; non-toxic CV: Regular Rate. Well-perfused. Warm.  Resp: Breathing unlabored on room air; no wheezing. Psych: Fluid speech in conversation; appropriate affect; normal thought process  Ortho Exam Left upper extremity: - Skin is intact throughout the wrist and hand, hand remains warm well-perfused, able to perform digital range of motion - Notable swelling and ecchymosis throughout the wrist and forearm region -  Sensation is intact in all distributions - Prior surgical scar over the radial aspect of the wrist from prior mass excision, this is consistent with the x-rays which demonstrate vascular clips.  Imaging: XR Wrist Complete Left Result Date:  12/21/2022 X-rays of the left wrist, multiple views were obtained today X-rays demonstrate intra-articular distal radius fracture with multiple fragments.  There is notable joint step-off as well of approximately 3 mm.   Past Medical/Family/Surgical/Social History: Medications & Allergies reviewed per EMR, new medications updated. Patient Active Problem List   Diagnosis Date Noted   Acute heart failure with preserved ejection fraction (HCC) 11/12/2022   Mild mitral regurgitation 10/30/2022   Aortic valve stenosis 10/30/2022   COPD (chronic obstructive pulmonary disease) (HCC) 01/11/2022   GERD (gastroesophageal reflux disease) 01/11/2022   Hypercholesterolemia 01/11/2022   Hypertension 01/11/2022   Lumbago with sciatica, left side 01/11/2022   Lumbago with sciatica, right side 01/11/2022   Obesity (BMI 30.0-34.9) 01/11/2022   Other chronic pain 01/11/2022   Pulmonary nodule 01/11/2022   Lumbar foraminal stenosis 01/11/2022   Aortic ectasia (HCC) 01/11/2022   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   Malignant neoplasm of prostate (HCC) 07/12/2020   S/P insertion of spinal cord stimulator 01/16/2018   Back pain with history of spinal surgery 09/09/2017   Inflammation of sacroiliac joint (HCC) 09/20/2016   Opioid dependence (HCC) 01/10/2016   Swelling of lower limb 03/25/2014   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   Complains of low back pain 09/09/2013   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   Lumbosacral radiculitis 12/03/2012   Spinal stenosis of lumbar region 10/30/2012   Meningioma (HCC) 10/28/2012   Lumbosacral spondylosis without myelopathy 10/23/2012   Acute medial meniscal tear 07/22/2012   Dizziness 06/25/2012   Backache 06/25/2012   Benign neoplasm of cerebral meninges (HCC) 06/25/2012   Neck pain 06/25/2012   Lumbar stenosis with neurogenic claudication 05/08/2011   Past Medical History:  Diagnosis Date   Arthritis     Back pain    Radiates down both legs   BPH (benign prostatic hyperplasia)    Cancer (HCC) 2022   prostate cancer   COPD (chronic obstructive pulmonary disease) (HCC)    Dyspnea    Enlarged prostate    GERD (gastroesophageal reflux disease)    Glaucoma    H/O hiatal hernia    H/O wheezing    occ inhaler usage   Heart murmur    Hypertension    Lumbar foraminal stenosis    Macular degeneration    Meningioma (HCC) 2014   Treated with radiation   Neuromuscular disorder (HCC)    Neuropathy bilateral legs   PONV (postoperative nausea and vomiting)    Pulmonary nodules    resolved on 2019 follow up CT   S/P insertion of spinal cord stimulator    Family History  Problem Relation Age of Onset   Hypertension Mother    Bone cancer Mother    Stroke Father 19   COPD Maternal Uncle    Past Surgical History:  Procedure Laterality Date   ABDOMINAL EXPOSURE N/A 03/16/2022   Procedure: ABDOMINAL EXPOSURE;  Surgeon: Cephus Shelling, MD;  [facility];  Service: Vascular;  Laterality: N/A;   ANTERIOR LUMBAR FUSION N/A 03/16/2022   Procedure: Anterior Lumbar Interbody Fusion - Lumbar Five-Sacral One with cage;  Surgeon: Julio Sicks, MD;  Location: [facility] OR;  Service: Neurosurgery;  Laterality: N/A;   BACK SURGERY  06/08/2006   lam x3 cerv x 1  CERVICAL FUSION     COLONOSCOPY  2020   CYSTOSCOPY     72 yrs old   CYSTOSCOPY WITH URETHRAL DILATATION N/A 09/21/2021   Procedure: CYSTOSCOPY WITH BALLOON DILATION and OPTILUME BALLOON DILATION OF  URETHRAL DILATATION;  Surgeon: Noel Christmas, MD;  Location: [facility] ORS;  Service: Urology;  Laterality: N/A;   EYE SURGERY     bil   GOLD SEED IMPLANT N/A 09/06/2020   Procedure: GOLD SEED IMPLANT/ PLACEMENT OF FIDUCIAL MARKERS;  Surgeon: Noel Christmas, MD;  Location: [facility] ORS;  Service: Urology;  Laterality: N/A;   KNEE ARTHROSCOPY  01/02/2008   lft   KNEE ARTHROSCOPY Left 07/23/2012   Procedure: LEFT MEDIAL MENISCAL DEBRIDEMENT AND  CHONDROPLASTY;  Surgeon: Loanne Drilling, MD;  Location: [facility] ORS;  Service: Orthopedics;  Laterality: Left;   LUMBAR FUSION  02/22/2014   DR POOL   PILONIDAL CYST EXCISION     SINUS EXPLORATION     SPACE OAR INSTILLATION N/A 09/06/2020   Procedure: SPACE OAR INSTILLATION;  Surgeon: Noel Christmas, MD;  Location: [facility] OR[facility];  Service: Urology;  Laterality: N/A;   SPINAL CORD STIMULATOR INSERTION N/A 01/16/2018   Procedure: LUMBAR SPINAL CORD STIMULATOR INSERTION;  Surgeon: Tia Alert, MD;  Location: [facility] OR;  Service: Neurosurgery;  Laterality: N/A;  LUMBAR SPINAL CORD STIMULATOR INSERTION   TRANSURETHRAL RESECTION OF PROSTATE N/A 09/06/2020   Procedure: TRANSURETHRAL RESECTION OF THE PROSTATE (TURP);  Surgeon: Noel Christmas, MD;  Location: [facility] ORS;  Service: Urology;  Laterality: N/A;  2 HRS   WRIST GANGLION EXCISION     rt   WRIST SURGERY  01/02/2007   cyst lft   Social History   Occupational History   Not on file  Tobacco Use   Smoking status: Former    Current packs/day: 0.00    Average packs/day: 1.5 packs/day for 35.0 years (52.5 ttl pk-yrs)    Types: Cigarettes    Start date: 05/03/1965    Quit date: 05/03/2000    Years since quitting: 22.6   Smokeless tobacco: Never   Tobacco comments:    quit alcohol 73  Vaping Use   Vaping status: Never Used  Substance and Sexual Activity   Alcohol use: Not Currently    Comment: hx of years ago   Drug use: No   Sexual activity: Not Currently    Voyd Groft Fara Boros) Denese Killings, M.D. [facility] 9:54 AM

## 2022-12-24 DIAGNOSIS — H353231 Exudative age-related macular degeneration, bilateral, with active choroidal neovascularization: Secondary | ICD-10-CM | POA: Diagnosis not present

## 2022-12-27 ENCOUNTER — Other Ambulatory Visit: Payer: Self-pay | Admitting: Orthopedic Surgery

## 2022-12-28 ENCOUNTER — Encounter (HOSPITAL_COMMUNITY): Payer: Medicare Other

## 2022-12-28 DIAGNOSIS — S52502A Unspecified fracture of the lower end of left radius, initial encounter for closed fracture: Secondary | ICD-10-CM | POA: Diagnosis not present

## 2022-12-28 DIAGNOSIS — S52572A Other intraarticular fracture of lower end of left radius, initial encounter for closed fracture: Secondary | ICD-10-CM | POA: Diagnosis not present

## 2022-12-28 DIAGNOSIS — G8918 Other acute postprocedural pain: Secondary | ICD-10-CM | POA: Diagnosis not present

## 2022-12-31 ENCOUNTER — Encounter (HOSPITAL_COMMUNITY)
Admission: RE | Admit: 2022-12-31 | Discharge: 2022-12-31 | Disposition: A | Discharge status: Home / Self Care | Payer: Medicare Other | Source: Ambulatory Visit | Attending: Internal Medicine | Admitting: Internal Medicine

## 2022-12-31 DIAGNOSIS — R9389 Abnormal findings on diagnostic imaging of other specified body structures: Secondary | ICD-10-CM | POA: Insufficient documentation

## 2022-12-31 DIAGNOSIS — R918 Other nonspecific abnormal finding of lung field: Secondary | ICD-10-CM | POA: Diagnosis not present

## 2022-12-31 LAB — GLUCOSE, CAPILLARY: Glucose-Capillary: 98 mg/dL (ref 70–99)

## 2022-12-31 MED ORDER — FLUDEOXYGLUCOSE F - 18 (FDG) INJECTION
11.4000 | Freq: Once | INTRAVENOUS | Status: AC
  Administered 2022-12-31: 11.2 via INTRAVENOUS

## 2023-01-14 ENCOUNTER — Other Ambulatory Visit (INDEPENDENT_AMBULATORY_CARE_PROVIDER_SITE_OTHER): Payer: Self-pay

## 2023-01-14 ENCOUNTER — Encounter: Payer: Medicare Other | Admitting: Orthopedic Surgery

## 2023-01-14 ENCOUNTER — Ambulatory Visit (INDEPENDENT_AMBULATORY_CARE_PROVIDER_SITE_OTHER): Payer: Medicare Other | Admitting: Orthopedic Surgery

## 2023-01-14 DIAGNOSIS — M25532 Pain in left wrist: Secondary | ICD-10-CM

## 2023-01-14 NOTE — Progress Notes (Signed)
   BRAINARD HIGHFILL - 73 y.o. male MRN [id number]  Date of birth: [birth year]  Office Visit Note: Visit Date: 01/14/2023 PCP: Charlott Dorn LABOR, MD Referred by: Charlott Dorn LABOR, *  Subjective:  HPI: NIEKO CLARIN is a 73 y.o. male who presents today for follow up 2 weeks status post left wrist ORIF.  He is doing well, pain is well-controlled.  Pertinent ROS were reviewed with the patient and found to be negative unless otherwise specified above in HPI.   Assessment & Plan: Visit Diagnoses: No diagnosis found.  Plan: He is doing well postoperatively.  X-rays were reviewed today which do show stable appearance of the orthopedic hardware.  There is some slight collapse of the radial styloid fragment with a slightly prominent screw in this region.  He was able to perform gentle range of motion of the wrist today for me without significant crepitus.  Will have him see occupational therapy for fabrication of a removable orthosis and institute standard distal radius protocol.  Range of motion with progression of strengthening at the 8-week mark.  I will plan on seeing him back in approximately 4 weeks to track his progress.  Follow-up: No follow-ups on file.   Meds & Orders: No orders of the defined types were placed in this encounter.  No orders of the defined types were placed in this encounter.    Procedures: No procedures performed       Objective:   Vital Signs: There were no vitals taken for this visit.  Ortho Exam Left wrist: - Well-healed volar incision, sutures removed today, no erythema or drainage - Gentle range of motion at the wrist flexion/extension without significant crepitus - Will perform digital range of motion, near composite fist without restriction - Sensation is intact in all distributions distally  Imaging: X-rays demonstrate comminuted distal radius fracture with intra-articular extension, hardware remains well-fixed.  Radial styloid screw does show  some mild prominence, styloid fragment with mild interval displacement in comparison to preoperative films.  Joint line view does demonstrate subchondral appearance of the screws.   Darrian Grzelak Afton Alderton, M.D. [facility] 1:21 PM

## 2023-01-14 NOTE — Therapy (Signed)
 OUTPATIENT OCCUPATIONAL THERAPY ORTHO EVALUATION  [name] MRN: [id number] DOB:[birth year], 73 y.o., male Today's Date: 01/15/2023  PCP: Charlott Carrier, MD REFERRING PROVIDER: Arlinda Buster, MD   END OF SESSION:  OT End of Session - 01/15/23 1305     Visit Number 1    Number of Visits 12    Date for OT Re-Evaluation 03/01/23    Authorization Type Medicare    OT Start Time 1305    OT Stop Time 1428    OT Time Calculation (min) 83 min    Equipment Utilized During Treatment orthotic materials    Activity Tolerance Patient tolerated treatment well;No increased pain;Patient limited by fatigue;Patient limited by pain    Behavior During Therapy [facility] for tasks assessed/performed             Past Medical History:  Diagnosis Date   Arthritis    Back pain    Radiates down both legs   BPH (benign prostatic hyperplasia)    Cancer (HCC) 2022   prostate cancer   COPD (chronic obstructive pulmonary disease) (HCC)    Dyspnea    Enlarged prostate    GERD (gastroesophageal reflux disease)    Glaucoma    H/O hiatal hernia    H/O wheezing    occ inhaler usage   Heart murmur    Hypertension    Lumbar foraminal stenosis    Macular degeneration    Meningioma (HCC) 2014   Treated with radiation   Neuromuscular disorder (HCC)    Neuropathy bilateral legs   PONV (postoperative nausea and vomiting)    Pulmonary nodules    resolved on 2019 follow up CT   S/P insertion of spinal cord stimulator    Past Surgical History:  Procedure Laterality Date   ABDOMINAL EXPOSURE N/A 03/16/2022   Procedure: ABDOMINAL EXPOSURE;  Surgeon: Gretta Lonni PARAS, MD;  Location: [facility];  Service: Vascular;  Laterality: N/A;   ANTERIOR LUMBAR FUSION N/A 03/16/2022   Procedure: Anterior Lumbar Interbody Fusion - Lumbar Five-Sacral One with cage;  Surgeon: Louis Shove, MD;  Location: [facility];  Service: Neurosurgery;  Laterality: N/A;   BACK SURGERY  06/08/2006   lam x3 cerv x 1    CERVICAL FUSION     COLONOSCOPY  2020   CYSTOSCOPY     73 yrs old   CYSTOSCOPY WITH URETHRAL DILATATION N/A 09/21/2021   Procedure: CYSTOSCOPY WITH BALLOON DILATION and OPTILUME BALLOON DILATION OF  URETHRAL DILATATION;  Surgeon: Elisabeth Valli BIRCH, MD;  Location: [facility];  Service: Urology;  Laterality: N/A;   EYE SURGERY     bil   GOLD SEED IMPLANT N/A 09/06/2020   Procedure: GOLD SEED IMPLANT/ PLACEMENT OF FIDUCIAL MARKERS;  Surgeon: Elisabeth Valli BIRCH, MD;  Location: [facility];  Service: Urology;  Laterality: N/A;   KNEE ARTHROSCOPY  01/02/2008   lft   KNEE ARTHROSCOPY Left 07/23/2012   Procedure: LEFT MEDIAL MENISCAL DEBRIDEMENT AND CHONDROPLASTY;  Surgeon: Dempsey LULLA Moan, MD;  Location: [facility];  Service: Orthopedics;  Laterality: Left;   LUMBAR FUSION  02/22/2014   DR POOL   PILONIDAL CYST EXCISION     SINUS EXPLORATION     SPACE OAR INSTILLATION N/A 09/06/2020   Procedure: SPACE OAR INSTILLATION;  Surgeon: Elisabeth Valli BIRCH, MD;  Location: [facility];  Service: Urology;  Laterality: N/A;   SPINAL CORD STIMULATOR INSERTION N/A 01/16/2018   Procedure: LUMBAR SPINAL CORD STIMULATOR INSERTION;  Surgeon: Joshua Alm RAMAN, MD;  Location: [facility];  Service: Neurosurgery;  Laterality: N/A;  LUMBAR SPINAL CORD STIMULATOR INSERTION   TRANSURETHRAL RESECTION OF PROSTATE N/A 09/06/2020   Procedure: TRANSURETHRAL RESECTION OF THE PROSTATE (TURP);  Surgeon: Elisabeth Valli BIRCH, MD;  Location: [facility];  Service: Urology;  Laterality: N/A;  2 HRS   WRIST GANGLION EXCISION     rt   WRIST SURGERY  01/02/2007   cyst lft   Patient Active Problem List   Diagnosis Date Noted   Acute heart failure with preserved ejection fraction (HCC) 11/12/2022   Mild mitral regurgitation 10/30/2022   Aortic valve stenosis 10/30/2022   COPD (chronic obstructive pulmonary disease) (HCC) 01/11/2022   GERD (gastroesophageal reflux disease) 01/11/2022   Hypercholesterolemia 01/11/2022   Hypertension 01/11/2022   Lumbago with  sciatica, left side 01/11/2022   Lumbago with sciatica, right side 01/11/2022   Obesity (BMI 30.0-34.9) 01/11/2022   Other chronic pain 01/11/2022   Pulmonary nodule 01/11/2022   Lumbar foraminal stenosis 01/11/2022   Aortic ectasia (HCC) 01/11/2022   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   Malignant neoplasm of prostate (HCC) 07/12/2020   S/P insertion of spinal cord stimulator 01/16/2018   Back pain with history of spinal surgery 09/09/2017   Inflammation of sacroiliac joint (HCC) 09/20/2016   Opioid dependence (HCC) 01/10/2016   Swelling of lower limb 03/25/2014   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   Complains of low back pain 09/09/2013   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   Lumbosacral radiculitis 12/03/2012   Spinal stenosis of lumbar region 10/30/2012   Meningioma (HCC) 10/28/2012   Lumbosacral spondylosis without myelopathy 10/23/2012   Acute medial meniscal tear 07/22/2012   Dizziness 06/25/2012   Backache 06/25/2012   Benign neoplasm of cerebral meninges (HCC) 06/25/2012   Neck pain 06/25/2012   Lumbar stenosis with neurogenic claudication 05/08/2011    ONSET DATE: DOS 12/28/22  REFERRING DIAG:  F74.467 (ICD-10-CM) - Pain in left wrist  S52.572A (ICD-10-CM) - Other closed intra-articular fracture of distal end of left radius, initial encounter    THERAPY DIAG:  Localized edema  Muscle weakness (generalized)  Other lack of coordination  Pain in left wrist  Stiffness of left wrist, not elsewhere classified  Rationale for Evaluation and Treatment: Rehabilitation  SUBJECTIVE:   SUBJECTIVE STATEMENT: Now 2+ weeks post Lt DRF ORIF.  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  Aside from stiffness, pain, swelling in the left nondominant arm, he states his balance is his biggest concern and he is worried about falling again.  He uses a rollator at baseline, also  hx of lumbar fusions.    PERTINENT HISTORY: Lt DRF s/p ORIF  PRECAUTIONS: Other: allergy  to tape, low to moderate risk of falls  ; RED FLAGS: None   WEIGHT BEARING RESTRICTIONS: Yes NWB Lt wrist now  PAIN:  Are you having pain? as he is on oxycodone  at baseline for back pain, states no significant pain at rest in wrist now  FALLS: Has patient fallen in last 6 months? Yes. Number of falls at least this one, and he states his balance is poor and concerning to him-he will be considered a low to moderate risk of falls and OT will use close approximation for this during any standing or mobility in the session.  LIVING ENVIRONMENT: Lives with: lives with their spouse Lives in: House/apartment Has following equipment at home: Vannie - 4 wheeled and shower chair  PLOF: Independent with basic ADLs, Independent  with household mobility with device, Independent with community mobility with device, Independent with homemaking with ambulation, and Needs assistance with homemaking  PATIENT GOALS: To improve the use of his left nondominant hand and arm  NEXT MD VISIT: TBD   OBJECTIVE: (All objective assessments below are from initial evaluation on: 01/15/23 unless otherwise specified.)   HAND DOMINANCE: Right   ADLs: Overall ADLs: States decreased ability to grab, hold household objects, pain and difficulty to open containers, perform FMS tasks (manipulate fasteners on clothing), mild bathing problems as well.    FUNCTIONAL OUTCOME MEASURES: Eval: Patient Rated Wrist Evaluation (PRWE): Pain: 25/50; Function: 30/50; Total Score: 55/100 (Higher Score  =  More Pain and/or Debility)    UPPER EXTREMITY ROM     Shoulder to Wrist AROM Left eval  Forearm supination 44  Forearm pronation  60  Wrist flexion 24  Wrist extension 40  Wrist ulnar deviation 9  Wrist radial deviation 5  Functional dart thrower's motion (F-DTM) in ulnar flexion 19  F-DTM in radial extension  24  (Blank rows = not  tested)   Hand AROM Left eval  Full Fist Ability (or Gap to Distal Palmar Crease) Loose, full fist  Thumb Opposition  (Kapandji Scale)  8/10  (Blank rows = not tested)   UPPER EXTREMITY MMT:    Eval:  NT at eval due to recent and still healing injuries. Will be tested when appropriate.   MMT Left TBD  Shoulder flexion   Shoulder abduction   Shoulder adduction   Shoulder extension   Shoulder internal rotation   Shoulder external rotation   Middle trapezius   Lower trapezius   Elbow flexion   Elbow extension   Forearm supination   Forearm pronation   Wrist flexion   Wrist extension   Wrist ulnar deviation   Wrist radial deviation   (Blank rows = not tested)  HAND FUNCTION: Eval: Observed weakness in affected Lt hand. Details when safe.  Grip strength Right: 70 lbs, Left: TBD lbs   COORDINATION: Eval: Observed coordination impairments with affected Lt hand. Details tested when able Box and Blocks TEST Left: TBD blocks (TBD is WFL)   SENSATION: Eval:  Light touch intact today  EDEMA:   Eval:  Mildly swollen in Lt hand and wrist today, 22cm circumferentially around distal wrist crease (19.8cm on the opposite arm)  COGNITION: Eval: Overall cognitive status: WFL for evaluation today   OBSERVATIONS:   Eval: Does have an odd look to the surgical area in terms of a puffy and swollen and lumpy volar forearm.  He does state prior surgeries and damage there and does have evidence on x-ray of clips of metal being left in his arm.  This also presents somewhat like a lipoma perhaps.  He is not significantly tender or red with no drainage or exudate, so OT does not feel this is indicative of infection.  Lt DRF, ORIF   TODAY'S TREATMENT:  Post-evaluation treatment:   Custom orthotic fabrication was indicated due to pt's healing fractured radius and need for safe, functional positioning. OT fabricated custom wrist immobilization orthosis for pt today to protect the surgery  and his wrist. It fit well with no areas of pressure, pt states a comfortable fit. Pt was educated on the wearing schedule (on at all times except for hygiene and exercises), to avoid exposing it to sources of heat, to wipe clean as needed (do not wash, use harsh detergents), to call or come in ASAP if it  is causing any irritation or is not achieving desired function. It will be checked/adjusted in upcoming sessions, as needed. Pt states understanding all directions.    Additionally he was given safety/self-care information to not use heat on his arm yet, not soak his wound yet, to apply a Vaseline and do scar massages gently 3-4 times a day, and also to do no heavy lifting pushing pulling gripping squeezing, etc. until told in therapy that this is safe.  He was given the following home exercises to perform at least 4 times a day and each 1 was explained to him, demonstrated with his demonstration back for understanding.  He has no pain with these and states understanding.  Exercises - Standing Shoulder Flexion Full Range  - 4 x daily - 1-2 sets - 10-15 reps - Palm Up / Palm Down  - 4 x daily - 1-2 sets - 10-15 reps - Bend and Pull Back Wrist SLOWLY  - 4 x daily - 1-2 sets - 10-15 reps - Windshield Wipers   - 4 x daily - 1-2 sets - 10-15 reps - Wrist AROM Dart Throwers Motion  - 4 x daily - 1-2 sets - 10-15 reps - Touch Thumb to [facility] Finger  - 3-4 x daily - 1-2 sets - 10 reps - Tendon Glides  - 3-4 x daily - 3-5 reps - 2-3 seconds hold Patient Education - Scar Massage   PATIENT EDUCATION: Education details: See tx section above for details  Person educated: Patient Education method: Verbal Instruction, Teach back, Handouts  Education comprehension: States and demonstrates understanding, Additional Education required    HOME EXERCISE PROGRAM: Access Code: [email or web address] URL: [email or web address] Date: 01/15/2023 Prepared by: Melvenia Ada   GOALS: Goals reviewed with  patient? Yes   SHORT TERM GOALS: (STG required if POC>30 days) Target Date: 02/01/2023  Pt will obtain protective, custom orthotic. Goal status: 01/15/2023: MET   2.  Pt will demo/state understanding of initial HEP to improve pain levels and prerequisite motion. Goal status: INITIAL   LONG TERM GOALS: Target Date: 03/01/2023  Pt will improve functional ability by decreased impairment per PRWE assessment from 55/10 to 20/100 or better, for better quality of life. Goal status: INITIAL  2.  Pt will improve grip strength in left hand from not tested at eval to at least 45 lbs for functional use at home and in IADLs. Goal status: INITIAL  3.  Pt will improve A/ROM in left wrist flexion/extension from 24/40 to at least 55 degrees each, to have functional motion for tasks like reach and grasp.  Goal status: INITIAL  4.  Pt will improve strength in left wrist flexion/extension from not tested at evaluation to at least 4+/5 MMT to have increased functional ability to carry out selfcare and higher-level homecare tasks with less difficulty. Goal status: INITIAL  5.  Pt will improve coordination skills in left arm, as seen by within functional limit score on box and blocks testing to have increased functional ability to carry out fine motor tasks (fasteners, etc.) and more complex, coordinated IADLs (meal prep, sports, etc.).  Goal status: INITIAL   ASSESSMENT:  CLINICAL IMPRESSION: Patient is a 73 y.o. male who was seen today for occupational therapy evaluation for pain, weakness, swelling, decreased coordination, decreased functional ability in the left nondominant hand and arm after fall, fracture and subsequent ORIF surgery.  He will benefit from outpatient occupational therapy to decrease symptoms and increase skills and quality of life.   PERFORMANCE DEFICITS: in  functional skills including ADLs, IADLs, coordination, sensation, edema, strength, pain, fascial restrictions, muscle spasms,  flexibility, Fine motor control, Gross motor control, body mechanics, endurance, decreased knowledge of precautions, wound, and UE functional use, cognitive skills including problem solving and safety awareness, and psychosocial skills including coping strategies, environmental adaptation, habits, and routines and behaviors.   IMPAIRMENTS: are limiting patient from ADLs, IADLs, leisure, and social participation.   COMORBIDITIES: has co-morbidities such as spinal fusions, poor gait and balance, COPD, cancer history, and many others  that affects occupational performance. Patient will benefit from skilled OT to address above impairments and improve overall function.  MODIFICATION OR ASSISTANCE TO COMPLETE EVALUATION: No modification of tasks or assist necessary to complete an evaluation.  OT OCCUPATIONAL PROFILE AND HISTORY: Problem focused assessment: Including review of records relating to presenting problem.  CLINICAL DECISION MAKING: Moderate - several treatment options, min-mod task modification necessary  REHAB POTENTIAL: Excellent  EVALUATION COMPLEXITY: Low      PLAN:  OT FREQUENCY: 1-2x/week  OT DURATION: 6 weeks through 03/01/2023 and up to 12 total visits as needed  PLANNED INTERVENTIONS: 97168 OT Re-evaluation, 97535 self care/ADL training, 02889 therapeutic exercise, 97530 therapeutic activity, 97112 neuromuscular re-education, 97140 manual therapy, 97039 fluidotherapy, 97010 moist heat, 97010 cryotherapy, 97034 contrast bath, 97760 Orthotics management and training, 02239 Splinting (initial encounter), [id number] Subsequent splinting/medication, scar mobilization, compression bandaging, Dry needling, energy conservation, coping strategies training, and patient/family education  RECOMMENDED OTHER SERVICES: OT strongly recommends a referral to physical therapy for balance, gait, mobility and fall prevention.  OT will send a note to his PCP about this.  CONSULTED AND AGREED WITH PLAN  OF CARE: Patient  PLAN FOR NEXT SESSION:   Check orthosis and review initial HEP and recommendations     Melvenia Ada, OTR/L, CHT 01/15/2023, 5:15 PM

## 2023-01-15 ENCOUNTER — Encounter: Payer: Self-pay | Admitting: Rehabilitative and Restorative Service Providers"

## 2023-01-15 ENCOUNTER — Ambulatory Visit (INDEPENDENT_AMBULATORY_CARE_PROVIDER_SITE_OTHER): Payer: Medicare Other | Admitting: Rehabilitative and Restorative Service Providers"

## 2023-01-15 DIAGNOSIS — M25532 Pain in left wrist: Secondary | ICD-10-CM | POA: Diagnosis not present

## 2023-01-15 DIAGNOSIS — R600 Localized edema: Secondary | ICD-10-CM

## 2023-01-15 DIAGNOSIS — M25632 Stiffness of left wrist, not elsewhere classified: Secondary | ICD-10-CM

## 2023-01-15 DIAGNOSIS — R278 Other lack of coordination: Secondary | ICD-10-CM | POA: Diagnosis not present

## 2023-01-15 DIAGNOSIS — M6281 Muscle weakness (generalized): Secondary | ICD-10-CM

## 2023-01-16 ENCOUNTER — Ambulatory Visit (INDEPENDENT_AMBULATORY_CARE_PROVIDER_SITE_OTHER): Payer: Medicare Other | Admitting: Primary Care

## 2023-01-16 ENCOUNTER — Encounter: Payer: Self-pay | Admitting: Primary Care

## 2023-01-16 VITALS — BP 158/81 | HR 60 | Temp 97.9°F | Ht 66.5 in | Wt 235.4 lb

## 2023-01-16 DIAGNOSIS — J449 Chronic obstructive pulmonary disease, unspecified: Secondary | ICD-10-CM | POA: Diagnosis not present

## 2023-01-16 DIAGNOSIS — I5032 Chronic diastolic (congestive) heart failure: Secondary | ICD-10-CM

## 2023-01-16 DIAGNOSIS — J984 Other disorders of lung: Secondary | ICD-10-CM

## 2023-01-16 MED ORDER — BREZTRI AEROSPHERE 160-9-4.8 MCG/ACT IN AERO
2.0000 | INHALATION_SPRAY | Freq: Two times a day (BID) | RESPIRATORY_TRACT | Status: DC
Start: 2023-01-16 — End: 2023-03-21

## 2023-01-16 NOTE — Progress Notes (Signed)
 @Patient  ID: Cody Tate, male    DOB: [birth year], 73 y.o.   MRN: [id number]  Chief Complaint  Patient presents with   Follow-up    Referring provider: Benedetta Bradley, *  HPI: 73 year old male, former smoker.  Past medical history significant for heart failure, hypertension, mild mitral regurgitation, COPD, lung nodule, GERD, meningioma, BPH, lumbar stenosis, opioid dependency, status post spinal cord stimulator.  Patient of Dr. Bertrum Brodie.  01/16/2023 Discussed the use of AI scribe software for clinical note transcription with the patient, who gave verbal consent to proceed.  History of Present Illness   The patient, with a history of emphysema, coronary artery disease, and diastolic dysfunction, presented for a follow-up visit after a recent shortness of breath episode. The patient had undergone several tests, including lab work, HRCT scan, and a PET scan. The lab results indicated a mild elevation in BNP, suggesting possible fluid retention due to heart function, and positive allergy  results for various grasses. The patient's alpha 1 level was normal, ruling out a genetic predisposition for COPD.  The PET scan showed residual post-infectious or inflammatory scarring in the right upper lobe of the lung, likely due to a recent respiratory infection. The patient reported a history of coughing and mucus production a few months prior, which has since resolved. The PET scan also revealed evidence of previous radiation to the prostate but no signs of metastatic disease.  The patient's HRCT scan showed emphysema and consolidation in the right upper lobe, which has improved and left some scarring. The patient also has coronary artery calcification, indicating some plaque buildup, and an irregular liver margin, suggesting possible cirrhosis. The patient reported a history of moderate alcohol  consumption years ago.  The patient is currently on a regimen of Trelegy for maintenance and  Combivent  Respimat as a rescue inhaler for his emphysema. The frequency of Combivent  use varies, sometimes not needed, other times used once or twice a day. The patient also takes Lasix  and Jardiance  for heart-related issues and Simvastatin  for cholesterol management.  The patient reported some issues with fluid retention, which fluctuates and affects his mobility. He also expressed a desire for improved breathing, which may be affected by the fluid retention. The patient does not currently use oxygen and has not had a breathing test since 2015.      Allergies  Allergen Reactions   Other Itching, Rash and Other (See Comments)    VICRYL Sutures   Tape Itching, Other (See Comments) and Rash    Occlusive tape and bandaids    Immunization History  Administered Date(s) Administered   DTP 06/01/2001   Fluad Quad(high Dose 65+) 09/16/2019   Influenza Split 10/16/2010, 10/08/2012, 10/24/2012, 10/20/2013   Influenza, High Dose Seasonal PF 10/22/2014, 09/07/2015   Influenza-Unspecified 10/03/2018, 11/02/2022   PFIZER(Purple Top)SARS-COV-2 Vaccination 02/13/2019, 03/10/2019, 10/05/2019, 04/19/2020   Pfizer Covid-19 Vaccine Bivalent Booster 20yrs & up 09/29/2020   Pneumococcal Conjugate-13 10/22/2014   Pneumococcal Polysaccharide-23 02/10/2015   Tdap 09/18/2011, 05/17/2012   Zoster Recombinant(Shingrix) 08/18/2018   Zoster, Live 08/04/2010    Past Medical History:  Diagnosis Date   Arthritis    Back pain    Radiates down both legs   BPH (benign prostatic hyperplasia)    Cancer (HCC) 2022   prostate cancer   COPD (chronic obstructive pulmonary disease) (HCC)    Dyspnea    Enlarged prostate    GERD (gastroesophageal reflux disease)    Glaucoma    H/O hiatal hernia    H/O  wheezing    occ inhaler usage   Heart murmur    Hypertension    Lumbar foraminal stenosis    Macular degeneration    Meningioma (HCC) 2014   Treated with radiation   Neuromuscular disorder (HCC)    Neuropathy  bilateral legs   PONV (postoperative nausea and vomiting)    Pulmonary nodules    resolved on 2019 follow up CT   S/P insertion of spinal cord stimulator     Tobacco History: Social History   Tobacco Use  Smoking Status Former   Current packs/day: 0.00   Average packs/day: 1.5 packs/day for 35.0 years (52.5 ttl pk-yrs)   Types: Cigarettes   Start date: 05/03/1965   Quit date: 05/03/2000   Years since quitting: 22.7  Smokeless Tobacco Never  Tobacco Comments   quit alcohol  73   Counseling given: Not Answered Tobacco comments: quit alcohol  73   Outpatient Medications Prior to Visit  Medication Sig Dispense Refill   Ascorbic Acid  (VITAMIN C ) 1000 MG tablet Take 1,000 mg by mouth daily.     celecoxib (CELEBREX) 200 MG capsule TAKE 1 TO 2 CAPSULES BY MOUTH DAILY WITH FOOD for 90     chlorhexidine  (HIBICLENS ) 4 % external liquid Apply 1 Application topically daily as needed (boils).     Cholecalciferol  (VITAMIN D3) 50 MCG (2000 UT) capsule Take 4,000 Units by mouth daily.     diphenhydrAMINE  (BENADRYL  ALLERGY ) 25 MG tablet Take 25 mg by mouth at bedtime.     empagliflozin  (JARDIANCE ) 10 MG TABS tablet Take 1 tablet (10 mg total) by mouth daily before breakfast. 30 tablet 3   finasteride  (PROSCAR ) 5 MG tablet Take 5 mg by mouth daily.     fluticasone  (FLONASE ) 50 MCG/ACT nasal spray Place 2 sprays into both nostrils daily.      furosemide  (LASIX ) 20 MG tablet Take 1 tablet (20 mg total) by mouth daily.     gabapentin  (NEURONTIN ) 300 MG capsule Take 300 mg by mouth 4 (four) times daily.     Guaifenesin  (MUCINEX  MAXIMUM STRENGTH) 1200 MG TB12 Take 1,200 mg by mouth 2 (two) times daily.     ibuprofen (ADVIL,MOTRIN) 200 MG tablet Take 400 mg by mouth 4 (four) times daily as needed for moderate pain.     Ipratropium-Albuterol  (COMBIVENT  RESPIMAT) 20-100 MCG/ACT AERS respimat Inhale 1 puff into the lungs every 6 (six) hours as needed for wheezing or shortness of breath. 12 g 3   latanoprost   (XALATAN ) 0.005 % ophthalmic solution Place 1 drop into both eyes at bedtime.      Lutein-Zeaxanthin 25-5 MG CAPS Take by mouth.     magnesium  hydroxide (MILK OF MAGNESIA) 400 MG/5ML suspension Take 15 mLs by mouth at bedtime.     metoprolol  succinate (TOPROL -XL) 100 MG 24 hr tablet Take 100 mg by mouth at bedtime. Take with or immediately following a meal.     montelukast  (SINGULAIR ) 10 MG tablet TAKE 1 TABLET BY MOUTH AT  BEDTIME 90 tablet 3   Multiple Vitamin (MULTIVITAMIN WITH MINERALS) TABS tablet Take 1 tablet by mouth daily.     omeprazole  (PRILOSEC) 40 MG capsule Take 1 capsule (40 mg total) by mouth daily before breakfast. 90 capsule 3   Oxycodone  HCl 10 MG TABS Take 10 mg by mouth every 4 (four) hours as needed (pain).     oxymetazoline  (AFRIN) 0.05 % nasal spray Place 2 sprays into both nostrils 2 (two) times daily as needed for congestion.  Propylene Glycol, PF, (SYSTANE COMPLETE PF) 0.6 % SOLN Place 1 drop into both eyes 3 (three) times daily as needed (dry eyes).     pseudoephedrine  (SUDAFED) 30 MG tablet Take 60 mg by mouth 3 (three) times daily.     Simethicone  125 MG CAPS Take 250 mg by mouth 4 (four) times daily as needed (for gas).     simvastatin  (ZOCOR ) 20 MG tablet Take 20 mg by mouth at bedtime.      sodium chloride  (OCEAN) 0.65 % SOLN nasal spray Place 1 spray into both nostrils as needed for congestion. As needed     Tamsulosin  HCl (FLOMAX ) 0.4 MG CAPS Take 0.4 mg by mouth 2 (two) times daily.     telmisartan (MICARDIS) 40 MG tablet Take 40 mg by mouth daily.     timolol (TIMOPTIC) 0.5 % ophthalmic solution 1 drop every morning.     TRELEGY ELLIPTA  200-62.5-25 MCG/ACT AEPB USE 1 INHALATION BY MOUTH ONCE  DAILY AT THE SAME TIME EACH DAY 180 each 3   spironolactone  (ALDACTONE ) 25 MG tablet Take 12.5 mg by mouth at bedtime. On HOLD as of 11/19/22 per Leala Prince, PA-C (Patient not taking: Reported on 01/16/2023)     Multiple Vitamins-Minerals (PRESERVISION AREDS 2+MULTI  VIT) CAPS Take 1 capsule by mouth in the morning and at bedtime. (Patient not taking: Reported on 01/16/2023)     No facility-administered medications prior to visit.   Review of Systems  Review of Systems  Constitutional: Negative.   HENT: Negative.    Respiratory:  Positive for shortness of breath. Negative for cough.   Cardiovascular:  Positive for leg swelling.     Physical Exam  BP (!) 158/81 (BP Location: Right Arm, Patient Position: Sitting, Cuff Size: Large)   Pulse 60   Temp 97.9 F (36.6 C)   Ht 5' 6.5" (1.689 m)   Wt 235 lb 6.4 oz (106.8 kg)   SpO2 98%   BMI 37.43 kg/m  Physical Exam Constitutional:      Appearance: Normal appearance. He is obese.  HENT:     Head: Normocephalic and atraumatic.  Cardiovascular:     Rate and Rhythm: Normal rate and regular rhythm.  Pulmonary:     Effort: Pulmonary effort is normal.     Breath sounds: Normal breath sounds. No wheezing, rhonchi or rales.  Musculoskeletal:        General: Normal range of motion.     Comments: Ambulate with a rolling walker   Skin:    General: Skin is warm and dry.  Neurological:     General: No focal deficit present.     Mental Status: He is alert and oriented to person, place, and time. Mental status is at baseline.  Psychiatric:        Mood and Affect: Mood normal.        Behavior: Behavior normal.        Thought Content: Thought content normal.        Judgment: Judgment normal.      Lab Results:  CBC    Component Value Date/Time   WBC 8.2 11/15/2022 1450   RBC 4.62 11/15/2022 1450   HGB 14.3 11/15/2022 1450   HCT 42.7 11/15/2022 1450   PLT 455.0 (H) 11/15/2022 1450   MCV 92.3 11/15/2022 1450   MCH 27.7 03/12/2022 1349   MCHC 33.5 11/15/2022 1450   RDW 13.6 11/15/2022 1450   LYMPHSABS 0.7 11/15/2022 1450   MONOABS 0.7 11/15/2022 1450  EOSABS 0.1 11/15/2022 1450   BASOSABS 0.1 11/15/2022 1450    BMET    Component Value Date/Time   NA 140 11/28/2022 1340   K 5.0  11/28/2022 1340   CL 100 11/28/2022 1340   CO2 23 11/28/2022 1340   GLUCOSE 91 11/28/2022 1340   GLUCOSE 113 (H) 03/12/2022 1349   BUN 18 11/28/2022 1340   BUN 22.8 08/05/2014 1203   CREATININE 0.85 11/28/2022 1340   CREATININE 1.0 08/05/2014 1203   CALCIUM  9.7 11/28/2022 1340   GFRNONAA >60 03/12/2022 1349   GFRAA 100 01/29/2020 1430    BNP No results found for: "BNP"  ProBNP    Component Value Date/Time   PROBNP 119.0 (H) 11/15/2022 1450    Imaging: XR Wrist Complete Left Result Date: 01/15/2023 X-rays left wrist, multiple views were obtained today X-rays demonstrate comminuted distal radius fracture with intra-articular extension, hardware remains well-fixed.  Radial styloid screw does show some mild prominence, styloid fragment with mild interval displacement in comparison to preoperative films.  Joint line view does demonstrate subchondral appearance of the screws.  NM PET Image Initial (PI) Skull Base To Thigh (F-18 FDG) Result Date: 01/10/2023 CLINICAL DATA:  Initial treatment strategy for right lung mass. History of prostate cancer. EXAM: NUCLEAR MEDICINE PET SKULL BASE TO THIGH TECHNIQUE: 11.2 mCi F-18 FDG was injected intravenously. Full-ring PET imaging was performed from the skull base to thigh after the radiotracer. CT data was obtained and used for attenuation correction and anatomic localization. Fasting blood glucose: 98 mg/dl COMPARISON:  CT chest dated 11/28/2022. CT abdomen/pelvis dated 06/13/2020. FINDINGS: Mediastinal blood pool activity: SUV max 3.0 Liver activity: SUV max NA NECK: No hypermetabolic lymph nodes in the neck. Incidental CT findings: None. CHEST: Residual platelike opacity in the posterior right upper lobe (series 7/image 33), suggesting post infectious inflammatory scarring. Prior cavitary right perihilar lesion is no longer visualized. No suspicious pulmonary nodules. No hypermetabolic mediastinal or hilar nodes. Incidental CT findings: Atherosclerotic  calcifications of the aortic arch. Mild three-vessel coronary atherosclerosis. ABDOMEN/PELVIS: No abnormal hypermetabolic activity within the liver, pancreas, adrenal glands, or spleen. No hypermetabolic lymph nodes in the abdomen or pelvis. Prostate is diminutive with adjacent fiducial markers. Incidental CT findings: Atherosclerotic calcifications of the abdominal aorta and branch vessels. SKELETON: No focal hypermetabolic activity to suggest skeletal metastasis. Incidental CT findings: Cervical spine fixation hardware. Lumbar spine fixation hardware. Thoracic spine stimulator. Degenerative changes of the visualized thoracolumbar spine. IMPRESSION: Residual post infectious/inflammatory scarring in the right upper lobe. Diminutive prostate with adjacent fiducial markers, suggesting radiation changes. No findings suspicious for metastatic disease. Electronically Signed   By: Zadie Herter M.D.   On: 01/10/2023 01:13   XR Wrist Complete Left Result Date: 12/21/2022 X-rays of the left wrist, multiple views were obtained today X-rays demonstrate intra-articular distal radius fracture with multiple fragments.  There is notable joint step-off as well of approximately 3 mm.    Assessment & Plan:   1. Stage 2 moderate COPD by GOLD classification (HCC) (Primary) - Pulmonary function test; Future - Overnight Pulse Oximetry Study; Future  2. Cavitary lesion of lung - Antinuclear Antib (ANA) - Cyclic citrul peptide antibody, IgG - ANCA screen with reflex titer - Sedimentation rate  3. Chronic heart failure with preserved ejection fraction (HCC)      Respiratory Allergies Patient tested positive for allergies to [place] grass, Timothy grass, and [place] grass. Continue Singulair  10mg  at bedtime   Mild Heart Failure Mildly elevated BNP, possibly contributing to shortness of  breath. Patient is on Lasix  and Jardiance  for management. -Continue Lasix  and Jardiance . -Regular follow-up with  cardiology.  Lung Scarring Residual postinfectious or inflammatory scarring in the right upper lobe of the lung. No evidence of metastatic disease or cancer. -Continue current management. -Rheumatology labs ordered to check for rheumatoid arthritis or other autoimmune conditions.  Emphysema Evidence of emphysema on CT scan. Patient is on Trelegy for management but feels his breathing could be better. Provide sample of Breztri  for trial. If effective, consider changing prescription.  Coronary Artery Calcification Evidence of coronary artery calcification on CT scan. Patient is on simvastatin  for management. -Regular follow-up with cardiology.  Liver Irregularity Mild liver irregularity noted on CT scan, abnormal LFTs in October 2024. Patient has history of alcohol  use. -Recommend follow-up with primary care for possible ultrasound of liver.  Shortness of Breath Possibly related to mild heart failure vs COPD -Continue diuretic as directed -Check PFTs at follow-up -Consider overnight oxygen test to check oxygen levels during sleep.  Follow-up in 3 months with repeat breathing test.        Antonio Baumgarten, NP 01/16/2023

## 2023-01-16 NOTE — Patient Instructions (Addendum)
   -  RESPIRATORY ALLERGIES: You tested positive for allergies to [place] grass, Timothy grass, and [place] grass. Continue with your current allergy  management plan.  -MILD HEART FAILURE: Mild heart failure means your heart is not pumping blood as well as it should, which can cause fluid buildup and shortness of breath. Continue taking Lasix  and Jardiance , and follow up regularly with your cardiologist.  -POSSIBLE AUTOIMMUNE CONDITION: We are checking for autoimmune conditions like rheumatoid arthritis. We will draw some blood today to run these tests.  -LUNG SCARRING: You have some scarring in your right upper lung from a past infection. There is no sign of cancer. Continue with your current lung management plan.  -EMPHYSEMA: Emphysema is a lung condition that causes shortness of breath. You are currently using Trelegy, and we will provide a sample of Breztri  to see if it works better for you.  -CORONARY ARTERY CALCIFICATION: This means there is some plaque buildup in your heart's arteries. Continue taking simvastatin  and follow up regularly with your cardiologist.  -LIVER IRREGULARITY: There is a mild irregularity in your liver, possibly due to past heavy alcohol  use. Follow up with your primary care doctor for a possible liver ultrasound.  -SHORTNESS OF BREATH: Your shortness of breath may be due to fluid buildup from mild heart failure. Continue taking your diuretic. We may also do an overnight oxygen test to check your oxygen levels during sleep.  Orders: Autoimmune labs today (ANA, RF, ANCA)- already ordered by Dr. MR  PFTs in 3 months (please order)  Walk 1 lap on RA   Rx: 2 Breztri  samples   INSTRUCTIONS: Please follow up in 3 months for a repeat breathing test. Continue with your current medications and management plans, and make sure to follow up with your cardiologist and primary care doctor as discussed.  Follow-up: 3 months with 1 hour PFT prior with Dr. Barba Levin

## 2023-01-17 ENCOUNTER — Ambulatory Visit: Payer: Medicare Other | Admitting: Physician Assistant

## 2023-01-17 LAB — SEDIMENTATION RATE: Sed Rate: 11 mm/h (ref 0–20)

## 2023-01-19 LAB — ANTI-NUCLEAR AB-TITER (ANA TITER): ANA Titer 1: 1:40 {titer} — ABNORMAL HIGH

## 2023-01-19 LAB — CYCLIC CITRUL PEPTIDE ANTIBODY, IGG: Cyclic Citrullin Peptide Ab: <16 U

## 2023-01-19 LAB — ANA: Anti Nuclear Antibody (ANA): POSITIVE — AB

## 2023-01-19 LAB — ANCA SCREEN W REFLEX TITER: ANCA SCREEN: NEGATIVE

## 2023-01-21 NOTE — Therapy (Signed)
OUTPATIENT OCCUPATIONAL THERAPY TREATMENT NOTE  [name] MRN: [id number] DOB:06-25-1950, 73 y.o., male Today's Date: 01/22/2023  PCP: Eleanora Neighbor, MD REFERRING PROVIDER: Samuella Cota, MD   END OF SESSION:  OT End of Session - 01/22/23 1353     Visit Number 2    Number of Visits 12    Date for OT Re-Evaluation 03/01/23    Authorization Type Medicare    OT Start Time 1354    OT Stop Time 1427    OT Time Calculation (min) 33 min    Equipment Utilized During Treatment --    Activity Tolerance Patient tolerated treatment well;No increased pain;Patient limited by fatigue;Patient limited by pain    Behavior During Therapy [facility] for tasks assessed/performed              Past Medical History:  Diagnosis Date   Arthritis    Back pain    Radiates down both legs   BPH (benign prostatic hyperplasia)    Cancer (HCC) 2022   prostate cancer   COPD (chronic obstructive pulmonary disease) (HCC)    Dyspnea    Enlarged prostate    GERD (gastroesophageal reflux disease)    Glaucoma    H/O hiatal hernia    H/O wheezing    occ inhaler usage   Heart murmur    Hypertension    Lumbar foraminal stenosis    Macular degeneration    Meningioma (HCC) 2014   Treated with radiation   Neuromuscular disorder (HCC)    Neuropathy bilateral legs   PONV (postoperative nausea and vomiting)    Pulmonary nodules    resolved on 2019 follow up CT   S/P insertion of spinal cord stimulator    Past Surgical History:  Procedure Laterality Date   ABDOMINAL EXPOSURE N/A 03/16/2022   Procedure: ABDOMINAL EXPOSURE;  Surgeon: Cephus Shelling, MD;  Location: [facility] OR;  Service: Vascular;  Laterality: N/A;   ANTERIOR LUMBAR FUSION N/A 03/16/2022   Procedure: Anterior Lumbar Interbody Fusion - Lumbar Five-Sacral One with cage;  Surgeon: Julio Sicks, MD;  Location: [facility] OR;  Service: Neurosurgery;  Laterality: N/A;   BACK SURGERY  06/08/2006   lam x3 cerv x 1   CERVICAL FUSION      COLONOSCOPY  2020   CYSTOSCOPY     73 yrs old   CYSTOSCOPY WITH URETHRAL DILATATION N/A 09/21/2021   Procedure: CYSTOSCOPY WITH BALLOON DILATION and OPTILUME BALLOON DILATION OF  URETHRAL DILATATION;  Surgeon: Noel Christmas, MD;  Location: [facility] ORS;  Service: Urology;  Laterality: N/A;   EYE SURGERY     bil   GOLD SEED IMPLANT N/A 09/06/2020   Procedure: GOLD SEED IMPLANT/ PLACEMENT OF FIDUCIAL MARKERS;  Surgeon: Noel Christmas, MD;  Location: [facility] ORS;  Service: Urology;  Laterality: N/A;   KNEE ARTHROSCOPY  01/02/2008   lft   KNEE ARTHROSCOPY Left 07/23/2012   Procedure: LEFT MEDIAL MENISCAL DEBRIDEMENT AND CHONDROPLASTY;  Surgeon: Loanne Drilling, MD;  Location: [facility] ORS;  Service: Orthopedics;  Laterality: Left;   LUMBAR FUSION  02/22/2014   DR POOL   PILONIDAL CYST EXCISION     SINUS EXPLORATION     SPACE OAR INSTILLATION N/A 09/06/2020   Procedure: SPACE OAR INSTILLATION;  Surgeon: Noel Christmas, MD;  Location: [facility] ORS;  Service: Urology;  Laterality: N/A;   SPINAL CORD STIMULATOR INSERTION N/A 01/16/2018   Procedure: LUMBAR SPINAL CORD STIMULATOR INSERTION;  Surgeon: Tia Alert, MD;  Location: [facility] OR;  Service: Neurosurgery;  Laterality: N/A;  LUMBAR SPINAL CORD STIMULATOR INSERTION   TRANSURETHRAL RESECTION OF PROSTATE N/A 09/06/2020   Procedure: TRANSURETHRAL RESECTION OF THE PROSTATE (TURP);  Surgeon: Noel Christmas, MD;  Location: [facility] ORS;  Service: Urology;  Laterality: N/A;  2 HRS   WRIST GANGLION EXCISION     rt   WRIST SURGERY  01/02/2007   cyst lft   Patient Active Problem List   Diagnosis Date Noted   Acute heart failure with preserved ejection fraction (HCC) 11/12/2022   Mild mitral regurgitation 10/30/2022   Aortic valve stenosis 10/30/2022   COPD (chronic obstructive pulmonary disease) (HCC) 01/11/2022   GERD (gastroesophageal reflux disease) 01/11/2022   Hypercholesterolemia 01/11/2022   Hypertension 01/11/2022   Lumbago with sciatica, left side  01/11/2022   Lumbago with sciatica, right side 01/11/2022   Obesity (BMI 30.0-34.9) 01/11/2022   Other chronic pain 01/11/2022   Pulmonary nodule 01/11/2022   Lumbar foraminal stenosis 01/11/2022   Aortic ectasia (HCC) 01/11/2022   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   Malignant neoplasm of prostate (HCC) 07/12/2020   S/P insertion of spinal cord stimulator 01/16/2018   Back pain with history of spinal surgery 09/09/2017   Inflammation of sacroiliac joint (HCC) 09/20/2016   Opioid dependence (HCC) 01/10/2016   Swelling of lower limb 03/25/2014   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   Complains of low back pain 09/09/2013   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   Lumbosacral radiculitis 12/03/2012   Spinal stenosis of lumbar region 10/30/2012   Meningioma (HCC) 10/28/2012   Lumbosacral spondylosis without myelopathy 10/23/2012   Acute medial meniscal tear 07/22/2012   Dizziness 06/25/2012   Backache 06/25/2012   Benign neoplasm of cerebral meninges (HCC) 06/25/2012   Neck pain 06/25/2012   Lumbar stenosis with neurogenic claudication 05/08/2011    ONSET DATE: DOS 12/28/22  REFERRING DIAG:  O13.086 (ICD-10-CM) - Pain in left wrist  S52.572A (ICD-10-CM) - Other closed intra-articular fracture of distal end of left radius, initial encounter    THERAPY DIAG:  Localized edema  Muscle weakness (generalized)  Other lack of coordination  Pain in left wrist  Stiffness of left wrist, not elsewhere classified  Rationale for Evaluation and Treatment: Rehabilitation  PERTINENT HISTORY: Lt DRF s/p ORIF 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  Aside from stiffness, pain, swelling in the left nondominant arm, he states his balance is his biggest concern and he is worried about falling again.  He uses a rollator at baseline, also hx of lumbar fusions.    PRECAUTIONS: Other: allergy  to tape, low to moderate risk of falls ; RED FLAGS: None   WEIGHT BEARING RESTRICTIONS: Yes NWB Lt wrist now   SUBJECTIVE:   SUBJECTIVE STATEMENT: Now 3+ weeks post Lt DRF ORIF. He states still not having any pain, his orthosis is fitting well and he has been having no big issues with his home exercise program..      PAIN:  Are you having pain?  as he is on oxycodone at baseline for back pain, states no significant pain at rest in wrist now  FALLS: Has patient fallen in last 6 months? Yes. Number of falls at least this one, and he states his balance is poor and concerning to him-he will be considered a low to moderate risk of falls and OT will use close approximation for this during any standing or mobility in the session.  LIVING  ENVIRONMENT: Lives with: lives with their spouse Lives in: House/apartment Has following equipment at home: Environmental consultant - 4 wheeled and shower chair  PLOF: Independent with basic ADLs, Independent with household mobility with device, Independent with community mobility with device, Independent with homemaking with ambulation, and Needs assistance with homemaking  PATIENT GOALS: To improve the use of his left nondominant hand and arm  NEXT MD VISIT: TBD   OBJECTIVE: (All objective assessments below are from initial evaluation on: 01/15/23 unless otherwise specified.)   HAND DOMINANCE: Right   ADLs: Overall ADLs: States decreased ability to grab, hold household objects, pain and difficulty to open containers, perform FMS tasks (manipulate fasteners on clothing), mild bathing problems as well.    FUNCTIONAL OUTCOME MEASURES: Eval: Patient Rated Wrist Evaluation (PRWE): Pain: 25/50; Function: 30/50; Total Score: 55/100 (Higher Score  =  More Pain and/or Debility)    UPPER EXTREMITY ROM     Shoulder to Wrist AROM Left eval Lt 01/22/23  Forearm supination 44 53  Forearm pronation  60 70  Wrist flexion 24 36  Wrist extension 40 36  Wrist ulnar deviation  9 15  Wrist radial deviation 5 4  Functional dart thrower's motion (F-DTM) in ulnar flexion 19   F-DTM in radial extension  24   (Blank rows = not tested)   Hand AROM Left eval Lt 01/22/23  Full Fist Ability (or Gap to Distal Palmar Crease) Loose, full fist Full tight fist  Thumb Opposition  (Kapandji Scale)  8/10 9/10  (Blank rows = not tested)   UPPER EXTREMITY MMT:    Eval:  NT at eval due to recent and still healing injuries. Will be tested when appropriate.   MMT Left TBD  Shoulder flexion   Shoulder abduction   Shoulder adduction   Shoulder extension   Shoulder internal rotation   Shoulder external rotation   Middle trapezius   Lower trapezius   Elbow flexion   Elbow extension   Forearm supination   Forearm pronation   Wrist flexion   Wrist extension   Wrist ulnar deviation   Wrist radial deviation   (Blank rows = not tested)  HAND FUNCTION: Eval: Observed weakness in affected Lt hand. Details when safe.  Grip strength Right: 70 lbs, Left: TBD lbs   COORDINATION: Eval: Observed coordination impairments with affected Lt hand. Details tested when able Box and Blocks TEST Left: TBD blocks (TBD is [facility])   EDEMA:   Eval:  Mildly swollen in Lt hand and wrist today, 22cm circumferentially around distal wrist crease (19.8cm on the opposite arm)  COGNITION: Eval: Overall cognitive status: WFL for evaluation today   OBSERVATIONS:   Eval: Does have an odd look to the surgical area in terms of a puffy and swollen and lumpy volar forearm.  He does state prior surgeries and damage there and does have evidence on x-ray of "clips" of metal being left in his arm.  This also presents somewhat like a lipoma perhaps.  He is not significantly tender or red with no drainage or exudate, so OT does not feel this is indicative of infection.  Lt DRF, ORIF   TODAY'S TREATMENT:  01/22/23: Orthosis fitting well.   Starts with AROM which shows improved motion now with no increase in  pain.  We upgraded his home program to include stretches for tight motions that remain.   These things are bolded below, and each 1 was performed with him, explained, demonstrated and he demonstrates back with no pain.  He states understanding all new directions at the end of the session and leaves in no pain with no questions.  Exercises - Forearm Supination Stretch  - 3-4 x daily - 3-5 reps - 15 sec hold - Palm Up / Palm Down  - 4 x daily - 1-2 sets - 10 reps - Wrist Flexion Stretch  - 4 x daily - 3-5 reps - 15 sec hold - Seated Wrist Extension Stretch  - 3-6 x daily - 3-5 reps - 15 hold - Bend and Pull Back Wrist SLOWLY  - 4 x daily - 1-2 sets - 10 reps - Seated Wrist Radial Deviation Stretch  - 3-5 x daily - 3-5 reps - 15 hold - "Windshield Wipers"   - 4 x daily - 1-2 sets - 10 reps  Patient Education - Scar Massage     PATIENT EDUCATION: Education details: See tx section above for details  Person educated: Patient Education method: Verbal Instruction, Teach back, Handouts  Education comprehension: States and demonstrates understanding, Additional Education required    HOME EXERCISE PROGRAM: Access Code: [email or web address] URL: [email or web address] Date: 01/15/2023 Prepared by: Fannie Knee   GOALS: Goals reviewed with patient? Yes   SHORT TERM GOALS: (STG required if POC>30 days) Target Date: 02/01/2023  Pt will obtain protective, custom orthotic. Goal status: 01/15/2023: MET   2.  Pt will demo/state understanding of initial HEP to improve pain levels and prerequisite motion. Goal status: INITIAL   LONG TERM GOALS: Target Date: 03/01/2023  Pt will improve functional ability by decreased impairment per PRWE assessment from 55/10 to 20/100 or better, for better quality of life. Goal status: INITIAL  2.  Pt will improve grip strength in left hand from not tested at eval to at least 45 lbs for functional use at home and in IADLs. Goal status: INITIAL  3.   Pt will improve A/ROM in left wrist flexion/extension from 24/40 to at least 55 degrees each, to have functional motion for tasks like reach and grasp.  Goal status: INITIAL  4.  Pt will improve strength in left wrist flexion/extension from not tested at evaluation to at least 4+/5 MMT to have increased functional ability to carry out selfcare and higher-level homecare tasks with less difficulty. Goal status: INITIAL  5.  Pt will improve coordination skills in left arm, as seen by within functional limit score on box and blocks testing to have increased functional ability to carry out fine motor tasks (fasteners, etc.) and more complex, coordinated IADLs (meal prep, sports, etc.).  Goal status: INITIAL   ASSESSMENT:  CLINICAL IMPRESSION: 01/22/23: He is doing well at his second follow-up visit.  Motion is improving and pain is absent.  We have upgraded to new stretches now.  Eval: Patient is a 73 y.o. male who was seen today for occupational therapy evaluation for pain, weakness, swelling, decreased coordination, decreased functional ability in the left nondominant hand and arm after fall, fracture and subsequent ORIF surgery.  He will benefit from outpatient occupational therapy to decrease symptoms and increase skills and quality of life.    PLAN:  OT FREQUENCY: 1-2x/week  OT DURATION: 6 weeks through 03/01/2023 and up to 12 total visits as needed  PLANNED INTERVENTIONS: 97168 OT Re-evaluation, 97535 self care/ADL training, 25956 therapeutic exercise, 97530 therapeutic activity, 97112 neuromuscular re-education, 97140 manual therapy, 97039 fluidotherapy, 97010 moist heat, 97010 cryotherapy, 97034 contrast bath, 97760 Orthotics management and training, 38756 Splinting (initial encounter), [id number] Subsequent splinting/medication, scar mobilization, compression bandaging, Dry needling, energy  conservation, coping strategies training, and patient/family education  RECOMMENDED OTHER SERVICES: OT  strongly recommends a referral to physical therapy for balance, gait, mobility and fall prevention.  OT will send a note to his PCP about this.  CONSULTED AND AGREED WITH PLAN OF CARE: Patient  PLAN FOR NEXT SESSION:   Check on new stretches, use modalities and manual therapy as helpful.   Fannie Knee, OTR/L, CHT 01/22/2023, 2:29 PM

## 2023-01-22 ENCOUNTER — Encounter: Payer: Self-pay | Admitting: Rehabilitative and Restorative Service Providers"

## 2023-01-22 ENCOUNTER — Ambulatory Visit (INDEPENDENT_AMBULATORY_CARE_PROVIDER_SITE_OTHER): Payer: Medicare Other | Admitting: Rehabilitative and Restorative Service Providers"

## 2023-01-22 DIAGNOSIS — R600 Localized edema: Secondary | ICD-10-CM

## 2023-01-22 DIAGNOSIS — M6281 Muscle weakness (generalized): Secondary | ICD-10-CM

## 2023-01-22 DIAGNOSIS — R278 Other lack of coordination: Secondary | ICD-10-CM | POA: Diagnosis not present

## 2023-01-22 DIAGNOSIS — M25532 Pain in left wrist: Secondary | ICD-10-CM | POA: Diagnosis not present

## 2023-01-22 DIAGNOSIS — M25632 Stiffness of left wrist, not elsewhere classified: Secondary | ICD-10-CM | POA: Diagnosis not present

## 2023-01-22 NOTE — Therapy (Signed)
OUTPATIENT OCCUPATIONAL THERAPY TREATMENT NOTE  [name] MRN: [id number] DOB:15-Dec-1950, 73 y.o., male Today's Date: 01/24/2023  PCP: Eleanora Neighbor, MD REFERRING PROVIDER: Samuella Cota, MD   END OF SESSION:  OT End of Session - 01/24/23 1526     Visit Number 3    Number of Visits 12    Date for OT Re-Evaluation 03/01/23    Authorization Type Medicare    OT Start Time 1526    OT Stop Time 1555    OT Time Calculation (min) 29 min    Activity Tolerance Patient tolerated treatment well;No increased pain;Patient limited by fatigue;Patient limited by pain    Behavior During Therapy [facility] for tasks assessed/performed               Past Medical History:  Diagnosis Date   Arthritis    Back pain    Radiates down both legs   BPH (benign prostatic hyperplasia)    Cancer (HCC) 2022   prostate cancer   COPD (chronic obstructive pulmonary disease) (HCC)    Dyspnea    Enlarged prostate    GERD (gastroesophageal reflux disease)    Glaucoma    H/O hiatal hernia    H/O wheezing    occ inhaler usage   Heart murmur    Hypertension    Lumbar foraminal stenosis    Macular degeneration    Meningioma (HCC) 2014   Treated with radiation   Neuromuscular disorder (HCC)    Neuropathy bilateral legs   PONV (postoperative nausea and vomiting)    Pulmonary nodules    resolved on 2019 follow up CT   S/P insertion of spinal cord stimulator    Past Surgical History:  Procedure Laterality Date   ABDOMINAL EXPOSURE N/A 03/16/2022   Procedure: ABDOMINAL EXPOSURE;  Surgeon: Cephus Shelling, MD;  Location: [facility] OR;  Service: Vascular;  Laterality: N/A;   ANTERIOR LUMBAR FUSION N/A 03/16/2022   Procedure: Anterior Lumbar Interbody Fusion - Lumbar Five-Sacral One with cage;  Surgeon: Julio Sicks, MD;  Location: [facility] OR;  Service: Neurosurgery;  Laterality: N/A;   BACK SURGERY  06/08/2006   lam x3 cerv x 1   CERVICAL FUSION     COLONOSCOPY  2020   CYSTOSCOPY      73 yrs old   CYSTOSCOPY WITH URETHRAL DILATATION N/A 09/21/2021   Procedure: CYSTOSCOPY WITH BALLOON DILATION and OPTILUME BALLOON DILATION OF  URETHRAL DILATATION;  Surgeon: Noel Christmas, MD;  Location: [facility] ORS;  Service: Urology;  Laterality: N/A;   EYE SURGERY     bil   GOLD SEED IMPLANT N/A 09/06/2020   Procedure: GOLD SEED IMPLANT/ PLACEMENT OF FIDUCIAL MARKERS;  Surgeon: Noel Christmas, MD;  Location: [facility] ORS;  Service: Urology;  Laterality: N/A;   KNEE ARTHROSCOPY  01/02/2008   lft   KNEE ARTHROSCOPY Left 07/23/2012   Procedure: LEFT MEDIAL MENISCAL DEBRIDEMENT AND CHONDROPLASTY;  Surgeon: Loanne Drilling, MD;  Location: [facility] ORS;  Service: Orthopedics;  Laterality: Left;   LUMBAR FUSION  02/22/2014   DR POOL   PILONIDAL CYST EXCISION     SINUS EXPLORATION     SPACE OAR INSTILLATION N/A 09/06/2020   Procedure: SPACE OAR INSTILLATION;  Surgeon: Noel Christmas, MD;  Location: [facility] ORS;  Service: Urology;  Laterality: N/A;   SPINAL CORD STIMULATOR INSERTION N/A 01/16/2018   Procedure: LUMBAR SPINAL CORD STIMULATOR INSERTION;  Surgeon: Tia Alert, MD;  Location: [facility] OR;  Service: Neurosurgery;  Laterality: N/A;  LUMBAR SPINAL CORD STIMULATOR INSERTION   TRANSURETHRAL RESECTION OF PROSTATE N/A 09/06/2020   Procedure: TRANSURETHRAL RESECTION OF THE PROSTATE (TURP);  Surgeon: Noel Christmas, MD;  Location: [facility] ORS;  Service: Urology;  Laterality: N/A;  2 HRS   WRIST GANGLION EXCISION     rt   WRIST SURGERY  01/02/2007   cyst lft   Patient Active Problem List   Diagnosis Date Noted   Acute heart failure with preserved ejection fraction (HCC) 11/12/2022   Mild mitral regurgitation 10/30/2022   Aortic valve stenosis 10/30/2022   COPD (chronic obstructive pulmonary disease) (HCC) 01/11/2022   GERD (gastroesophageal reflux disease) 01/11/2022   Hypercholesterolemia 01/11/2022   Hypertension 01/11/2022   Lumbago with sciatica, left side 01/11/2022   Lumbago with sciatica, right  side 01/11/2022   Obesity (BMI 30.0-34.9) 01/11/2022   Other chronic pain 01/11/2022   Pulmonary nodule 01/11/2022   Lumbar foraminal stenosis 01/11/2022   Aortic ectasia (HCC) 01/11/2022   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   Malignant neoplasm of prostate (HCC) 07/12/2020   S/P insertion of spinal cord stimulator 01/16/2018   Back pain with history of spinal surgery 09/09/2017   Inflammation of sacroiliac joint (HCC) 09/20/2016   Opioid dependence (HCC) 01/10/2016   Swelling of lower limb 03/25/2014   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   Complains of low back pain 09/09/2013   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   Lumbosacral radiculitis 12/03/2012   Spinal stenosis of lumbar region 10/30/2012   Meningioma (HCC) 10/28/2012   Lumbosacral spondylosis without myelopathy 10/23/2012   Acute medial meniscal tear 07/22/2012   Dizziness 06/25/2012   Backache 06/25/2012   Benign neoplasm of cerebral meninges (HCC) 06/25/2012   Neck pain 06/25/2012   Lumbar stenosis with neurogenic claudication 05/08/2011    ONSET DATE: DOS 12/28/22  REFERRING DIAG:  W09.811 (ICD-10-CM) - Pain in left wrist  S52.572A (ICD-10-CM) - Other closed intra-articular fracture of distal end of left radius, initial encounter    THERAPY DIAG:  Localized edema  Muscle weakness (generalized)  Other lack of coordination  Pain in left wrist  Stiffness of left wrist, not elsewhere classified  Rationale for Evaluation and Treatment: Rehabilitation  PERTINENT HISTORY: Lt DRF s/p ORIF 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  Aside from stiffness, pain, swelling in the left nondominant arm, he states his balance is his biggest concern and he is worried about falling again.  He uses a rollator at baseline, also hx of lumbar fusions.    PRECAUTIONS: Other: allergy to tape, low to moderate risk of falls ;  RED FLAGS: None   WEIGHT BEARING RESTRICTIONS: Yes NWB Lt wrist now   SUBJECTIVE:   SUBJECTIVE STATEMENT: Now ~4 weeks post Lt DRF ORIF. He states doing stretches and not hurting when he did them, but after wards he seemed to get tight and a bit of soreness. He hasn't stretched or moved today out of caution and is now stiff.    PAIN:  Are you having pain?  1-2/10 soreness now Lt wrist; as he is on oxycodone at baseline for back pain, states no significant pain at rest in wrist now  FALLS: Has patient fallen in last 6 months? Yes. Number of falls at least this one, and he states his balance is poor and concerning to him-he will be considered a low to moderate risk of falls and OT will use close approximation for this during  any standing or mobility in the session.  LIVING ENVIRONMENT: Lives with: lives with their spouse Lives in: House/apartment Has following equipment at home: Environmental consultant - 4 wheeled and shower chair  PLOF: Independent with basic ADLs, Independent with household mobility with device, Independent with community mobility with device, Independent with homemaking with ambulation, and Needs assistance with homemaking  PATIENT GOALS: To improve the use of his left nondominant hand and arm  NEXT MD VISIT: TBD   OBJECTIVE: (All objective assessments below are from initial evaluation on: 01/15/23 unless otherwise specified.)   HAND DOMINANCE: Right   ADLs: Overall ADLs: States decreased ability to grab, hold household objects, pain and difficulty to open containers, perform FMS tasks (manipulate fasteners on clothing), mild bathing problems as well.    FUNCTIONAL OUTCOME MEASURES: Eval: Patient Rated Wrist Evaluation (PRWE): Pain: 25/50; Function: 30/50; Total Score: 55/100 (Higher Score  =  More Pain and/or Debility)    UPPER EXTREMITY ROM     Shoulder to Wrist AROM Left eval Lt 01/22/23 Lt 01/24/23  Forearm supination 44 53   Forearm pronation  60 70   Wrist flexion  24 36 27  Wrist extension 40 36 28  Wrist ulnar deviation 9 15   Wrist radial deviation 5 4   Functional dart thrower's motion (F-DTM) in ulnar flexion 19    F-DTM in radial extension  24    (Blank rows = not tested)   Hand AROM Left eval Lt 01/22/23  Full Fist Ability (or Gap to Distal Palmar Crease) Loose, full fist Full tight fist  Thumb Opposition  (Kapandji Scale)  8/10 9/10  (Blank rows = not tested)   UPPER EXTREMITY MMT:    Eval:  NT at eval due to recent and still healing injuries. Will be tested when appropriate.   MMT Left TBD  Shoulder flexion   Shoulder abduction   Shoulder adduction   Shoulder extension   Shoulder internal rotation   Shoulder external rotation   Middle trapezius   Lower trapezius   Elbow flexion   Elbow extension   Forearm supination   Forearm pronation   Wrist flexion   Wrist extension   Wrist ulnar deviation   Wrist radial deviation   (Blank rows = not tested)  HAND FUNCTION: Eval: Observed weakness in affected Lt hand. Details when safe.  Grip strength Right: 70 lbs, Left: TBD lbs   COORDINATION: Eval: Observed coordination impairments with affected Lt hand. Details tested when able Box and Blocks TEST Left: TBD blocks (TBD is [facility])   EDEMA:   Eval:  Mildly swollen in Lt hand and wrist today, 22cm circumferentially around distal wrist crease (19.8cm on the opposite arm)  OBSERVATIONS:   Eval: Does have an odd look to the surgical area in terms of a puffy and swollen and lumpy volar forearm.  He does state prior surgeries and damage there and does have evidence on x-ray of "clips" of metal being left in his arm.  This also presents somewhat like a lipoma perhaps.  He is not significantly tender or red with no drainage or exudate, so OT does not feel this is indicative of infection.  Lt DRF, ORIF   TODAY'S TREATMENT:  01/24/23: He starts with active range of motion today which shows some increased stiffness, likely from guarding  himself since yesterday and performing no exercises or stretches today.    Next, OT puts him on moist heat for 5 minutes while reviewing his exercises and expectations that even  if he feels like he is "strained something" he should still perform light active range of motion.  OT then has him perform his home exercises, with some hand overhand assist to ensure that he is not doing too forcefully or painfully.  After performing all new stretches and exercises, he states not having any significant pain and actually feeling better and looser.  OT decides not to add anything new to the HEP now so that he has time to master the stretches at home over the weekend.   Exercises reviewed and performed today: - Forearm Supination Stretch  - 3-4 x daily - 3-5 reps - 15 sec hold - Palm Up / Palm Down  - 4 x daily - 1-2 sets - 10 reps - Wrist Flexion Stretch  - 4 x daily - 3-5 reps - 15 sec hold - Seated Wrist Extension Stretch  - 3-6 x daily - 3-5 reps - 15 hold - Bend and Pull Back Wrist SLOWLY  - 4 x daily - 1-2 sets - 10 reps - Seated Wrist Radial Deviation Stretch  - 3-5 x daily - 3-5 reps - 15 hold - "Windshield Wipers"   - 4 x daily - 1-2 sets - 10 reps    PATIENT EDUCATION: Education details: See tx section above for details  Person educated: Patient Education method: Verbal Instruction, Teach back, Handouts  Education comprehension: States and demonstrates understanding, Additional Education required    HOME EXERCISE PROGRAM: Access Code: [email or web address] URL: [email or web address] Date: 01/15/2023 Prepared by: Fannie Knee   GOALS: Goals reviewed with patient? Yes   SHORT TERM GOALS: (STG required if POC>30 days) Target Date: 02/01/2023  Pt will obtain protective, custom orthotic. Goal status: 01/15/2023: MET   2.  Pt will demo/state understanding of initial HEP to improve pain levels and prerequisite motion. Goal status: 01/24/2023: Met   LONG TERM GOALS: Target  Date: 03/01/2023  Pt will improve functional ability by decreased impairment per PRWE assessment from 55/10 to 20/100 or better, for better quality of life. Goal status: INITIAL  2.  Pt will improve grip strength in left hand from not tested at eval to at least 45 lbs for functional use at home and in IADLs. Goal status: INITIAL  3.  Pt will improve A/ROM in left wrist flexion/extension from 24/40 to at least 55 degrees each, to have functional motion for tasks like reach and grasp.  Goal status: INITIAL  4.  Pt will improve strength in left wrist flexion/extension from not tested at evaluation to at least 4+/5 MMT to have increased functional ability to carry out selfcare and higher-level homecare tasks with less difficulty. Goal status: INITIAL  5.  Pt will improve coordination skills in left arm, as seen by within functional limit score on box and blocks testing to have increased functional ability to carry out fine motor tasks (fasteners, etc.) and more complex, coordinated IADLs (meal prep, sports, etc.).  Goal status: INITIAL   ASSESSMENT:  CLINICAL IMPRESSION: 01/24/23: He overdid it on his stretches a bit causing some soreness and pain.  He then performed a second round by not removing his orthosis today and not doing any exercises.  These problems were addressed today and seem to be cleared up.  He has no pain now and understands his plan of care better.  PLAN:  OT FREQUENCY: 1-2x/week  OT DURATION: 6 weeks through 03/01/2023 and up to 12 total visits as needed  PLANNED INTERVENTIONS: 97168 OT Re-evaluation, 97535 self care/ADL training, 13086  therapeutic exercise, 97530 therapeutic activity, 97112 neuromuscular re-education, 97140 manual therapy, 97039 fluidotherapy, 97010 moist heat, 97010 cryotherapy, 97034 contrast bath, 97760 Orthotics management and training, 62130 Splinting (initial encounter), [phone] Subsequent splinting/medication, scar mobilization, compression bandaging,  Dry needling, energy conservation, coping strategies training, and patient/family education  RECOMMENDED OTHER SERVICES: OT strongly recommends a referral to physical therapy for balance, gait, mobility and fall prevention.  OT will send a note to his PCP about this.  CONSULTED AND AGREED WITH PLAN OF CARE: Patient  PLAN FOR NEXT SESSION:   Next week at 4+ weeks postop he can begin weighted stretches as helpful, start joint mobilizations, start weaning from orthosis for light ADLs.   Fannie Knee, OTR/L, CHT 01/24/2023, 5:06 PM

## 2023-01-24 ENCOUNTER — Ambulatory Visit: Payer: Medicare Other | Admitting: Rehabilitative and Restorative Service Providers"

## 2023-01-24 ENCOUNTER — Encounter: Payer: Self-pay | Admitting: Rehabilitative and Restorative Service Providers"

## 2023-01-24 DIAGNOSIS — M25532 Pain in left wrist: Secondary | ICD-10-CM | POA: Diagnosis not present

## 2023-01-24 DIAGNOSIS — R278 Other lack of coordination: Secondary | ICD-10-CM

## 2023-01-24 DIAGNOSIS — M25632 Stiffness of left wrist, not elsewhere classified: Secondary | ICD-10-CM | POA: Diagnosis not present

## 2023-01-24 DIAGNOSIS — R600 Localized edema: Secondary | ICD-10-CM | POA: Diagnosis not present

## 2023-01-24 DIAGNOSIS — M6281 Muscle weakness (generalized): Secondary | ICD-10-CM | POA: Diagnosis not present

## 2023-01-28 ENCOUNTER — Telehealth: Payer: Self-pay | Admitting: Primary Care

## 2023-01-28 DIAGNOSIS — G4733 Obstructive sleep apnea (adult) (pediatric): Secondary | ICD-10-CM | POA: Diagnosis not present

## 2023-01-28 DIAGNOSIS — G4734 Idiopathic sleep related nonobstructive alveolar hypoventilation: Secondary | ICD-10-CM

## 2023-01-28 NOTE — Telephone Encounter (Signed)
Pt denies symptoms for osa. Pt is agreeable for nocturnal o2. 2l O2 HS. Verbal per Waynetta Sandy, NP. Pt does not have a current DME company.

## 2023-01-28 NOTE — Therapy (Signed)
OUTPATIENT OCCUPATIONAL THERAPY TREATMENT NOTE  [name] MRN: [id number] DOB:[birth year], 73 y.o., male Today's Date: 01/29/2023  PCP: Eleanora Neighbor, MD REFERRING PROVIDER: Samuella Cota, MD   END OF SESSION:  OT End of Session - 01/29/23 1306     Visit Number 4    Number of Visits 12    Date for OT Re-Evaluation 03/01/23    Authorization Type Medicare    OT Start Time 1306    OT Stop Time 1345    OT Time Calculation (min) 39 min    Equipment Utilized During Treatment compressive strap    Activity Tolerance Patient tolerated treatment well;No increased pain;Patient limited by fatigue;Patient limited by pain    Behavior During Therapy [facility] for tasks assessed/performed             Past Medical History:  Diagnosis Date   Arthritis    Back pain    Radiates down both legs   BPH (benign prostatic hyperplasia)    Cancer (HCC) 2022   prostate cancer   COPD (chronic obstructive pulmonary disease) (HCC)    Dyspnea    Enlarged prostate    GERD (gastroesophageal reflux disease)    Glaucoma    H/O hiatal hernia    H/O wheezing    occ inhaler usage   Heart murmur    Hypertension    Lumbar foraminal stenosis    Macular degeneration    Meningioma (HCC) 2014   Treated with radiation   Neuromuscular disorder (HCC)    Neuropathy bilateral legs   PONV (postoperative nausea and vomiting)    Pulmonary nodules    resolved on 2019 follow up CT   S/P insertion of spinal cord stimulator    Past Surgical History:  Procedure Laterality Date   ABDOMINAL EXPOSURE N/A 03/16/2022   Procedure: ABDOMINAL EXPOSURE;  Surgeon: Cephus Shelling, MD;  Location: [facility] OR;  Service: Vascular;  Laterality: N/A;   ANTERIOR LUMBAR FUSION N/A 03/16/2022   Procedure: Anterior Lumbar Interbody Fusion - Lumbar Five-Sacral One with cage;  Surgeon: Julio Sicks, MD;  Location: [facility] OR;  Service: Neurosurgery;  Laterality: N/A;   BACK SURGERY  06/08/2006   lam x3 cerv x 1    CERVICAL FUSION     COLONOSCOPY  2020   CYSTOSCOPY     73 yrs old   CYSTOSCOPY WITH URETHRAL DILATATION N/A 09/21/2021   Procedure: CYSTOSCOPY WITH BALLOON DILATION and OPTILUME BALLOON DILATION OF  URETHRAL DILATATION;  Surgeon: Noel Christmas, MD;  Location: [facility] ORS;  Service: Urology;  Laterality: N/A;   EYE SURGERY     bil   GOLD SEED IMPLANT N/A 09/06/2020   Procedure: GOLD SEED IMPLANT/ PLACEMENT OF FIDUCIAL MARKERS;  Surgeon: Noel Christmas, MD;  Location: [facility] ORS;  Service: Urology;  Laterality: N/A;   KNEE ARTHROSCOPY  01/02/2008   lft   KNEE ARTHROSCOPY Left 07/23/2012   Procedure: LEFT MEDIAL MENISCAL DEBRIDEMENT AND CHONDROPLASTY;  Surgeon: Loanne Drilling, MD;  Location: [facility] ORS;  Service: Orthopedics;  Laterality: Left;   LUMBAR FUSION  02/22/2014   DR POOL   PILONIDAL CYST EXCISION     SINUS EXPLORATION     SPACE OAR INSTILLATION N/A 09/06/2020   Procedure: SPACE OAR INSTILLATION;  Surgeon: Noel Christmas, MD;  Location: [facility] OR[facility];  Service: Urology;  Laterality: N/A;   SPINAL CORD STIMULATOR INSERTION N/A 01/16/2018   Procedure: LUMBAR SPINAL CORD STIMULATOR INSERTION;  Surgeon: Tia Alert, MD;  Location: [facility] OR;  Service: Neurosurgery;  Laterality: N/A;  LUMBAR SPINAL CORD STIMULATOR INSERTION   TRANSURETHRAL RESECTION OF PROSTATE N/A 09/06/2020   Procedure: TRANSURETHRAL RESECTION OF THE PROSTATE (TURP);  Surgeon: Noel Christmas, MD;  Location: [facility] OR[facility];  Service: Urology;  Laterality: N/A;  2 HRS   WRIST GANGLION EXCISION     rt   WRIST SURGERY  01/02/2007   cyst lft   Patient Active Problem List   Diagnosis Date Noted   Acute heart failure with preserved ejection fraction (HCC) 11/12/2022   Mild mitral regurgitation 10/30/2022   Aortic valve stenosis 10/30/2022   COPD (chronic obstructive pulmonary disease) (HCC) 01/11/2022   GERD (gastroesophageal reflux disease) 01/11/2022   Hypercholesterolemia 01/11/2022   Hypertension 01/11/2022   Lumbago with  sciatica, left side 01/11/2022   Lumbago with sciatica, right side 01/11/2022   Obesity (BMI 30.0-34.9) 01/11/2022   Other chronic pain 01/11/2022   Pulmonary nodule 01/11/2022   Lumbar foraminal stenosis 01/11/2022   Aortic ectasia (HCC) 01/11/2022   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   Malignant neoplasm of prostate (HCC) 07/12/2020   S/P insertion of spinal cord stimulator 01/16/2018   Back pain with history of spinal surgery 09/09/2017   Inflammation of sacroiliac joint (HCC) 09/20/2016   Opioid dependence (HCC) 01/10/2016   Swelling of lower limb 03/25/2014   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   Complains of low back pain 09/09/2013   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   Lumbosacral radiculitis 12/03/2012   Spinal stenosis of lumbar region 10/30/2012   Meningioma (HCC) 10/28/2012   Lumbosacral spondylosis without myelopathy 10/23/2012   Acute medial meniscal tear 07/22/2012   Dizziness 06/25/2012   Backache 06/25/2012   Benign neoplasm of cerebral meninges (HCC) 06/25/2012   Neck pain 06/25/2012   Lumbar stenosis with neurogenic claudication 05/08/2011    ONSET DATE: DOS 12/28/22  REFERRING DIAG:  O96.295 (ICD-10-CM) - Pain in left wrist  S52.572A (ICD-10-CM) - Other closed intra-articular fracture of distal end of left radius, initial encounter    THERAPY DIAG:  Localized edema  Muscle weakness (generalized)  Other lack of coordination  Pain in left wrist  Stiffness of left wrist, not elsewhere classified  Rationale for Evaluation and Treatment: Rehabilitation  PERTINENT HISTORY: Lt DRF s/p ORIF 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  Aside from stiffness, pain, swelling in the left nondominant arm, he states his balance is his biggest concern and he is worried about falling again.  He uses a rollator at baseline, also hx of lumbar fusions.     PRECAUTIONS: Other: allergy to tape, low to moderate risk of falls ; RED FLAGS: None   WEIGHT BEARING RESTRICTIONS: Yes NWB Lt wrist now   SUBJECTIVE:   SUBJECTIVE STATEMENT: Now ~4 weeks post Lt DRF ORIF. He states he had a fall in the bathroom over the weekend, landed on his butt, didn't hurt his wrist.  OT again highly recommends him to get physical therapy for his balance and safety   PAIN:  Are you having pain?   1-2/10 soreness now Lt wrist; as he is on oxycodone at baseline for back pain, states no significant pain at rest in wrist now  FALLS: Has patient fallen in last 6 months? Yes. Number of falls at least this one, and he states his balance is poor and concerning to him-he will be considered a low to moderate risk of falls and OT will use close  approximation for this during any standing or mobility in the session.  LIVING ENVIRONMENT: Lives with: lives with their spouse Lives in: House/apartment Has following equipment at home: Environmental consultant - 4 wheeled and shower chair  PLOF: Independent with basic ADLs, Independent with household mobility with device, Independent with community mobility with device, Independent with homemaking with ambulation, and Needs assistance with homemaking  PATIENT GOALS: To improve the use of his left nondominant hand and arm  NEXT MD VISIT: TBD   OBJECTIVE: (All objective assessments below are from initial evaluation on: 01/15/23 unless otherwise specified.)   HAND DOMINANCE: Right   ADLs: Overall ADLs: States decreased ability to grab, hold household objects, pain and difficulty to open containers, perform FMS tasks (manipulate fasteners on clothing), mild bathing problems as well.    FUNCTIONAL OUTCOME MEASURES: Eval: Patient Rated Wrist Evaluation (PRWE): Pain: 25/50; Function: 30/50; Total Score: 55/100 (Higher Score  =  More Pain and/or Debility)    UPPER EXTREMITY ROM     Shoulder to Wrist AROM Left eval Lt 01/22/23 Lt 01/24/23  Lt 01/29/23  Forearm supination 44 53  60  Forearm pronation  60 70  74  Wrist flexion 24 36 27 45 (68 Rt)   Wrist extension 40 36 28 34 (52 Rt)   Wrist ulnar deviation 9 15    Wrist radial deviation 5 4    Functional dart thrower's motion (F-DTM) in ulnar flexion 19     F-DTM in radial extension  24     (Blank rows = not tested)   Hand AROM Left eval Lt 01/22/23  Full Fist Ability (or Gap to Distal Palmar Crease) Loose, full fist Full tight fist  Thumb Opposition  (Kapandji Scale)  8/10 9/10  (Blank rows = not tested)   UPPER EXTREMITY MMT:    Eval:  NT at eval due to recent and still healing injuries. Will be tested when appropriate.   MMT Left TBD  Shoulder flexion   Shoulder abduction   Shoulder adduction   Shoulder extension   Shoulder internal rotation   Shoulder external rotation   Middle trapezius   Lower trapezius   Elbow flexion   Elbow extension   Forearm supination   Forearm pronation   Wrist flexion   Wrist extension   Wrist ulnar deviation   Wrist radial deviation   (Blank rows = not tested)  HAND FUNCTION: Eval: Observed weakness in affected Lt hand. Details when safe.  Grip strength Right: 70 lbs, Left: TBD lbs   COORDINATION: Eval: Observed coordination impairments with affected Lt hand. Details tested when able Box and Blocks TEST Left: TBD blocks (TBD is [facility])   EDEMA:   Eval:  Mildly swollen in Lt hand and wrist today, 22cm circumferentially around distal wrist crease (19.8cm on the opposite arm)  OBSERVATIONS:   Eval: Does have an odd look to the surgical area in terms of a puffy and swollen and lumpy volar forearm.  He does state prior surgeries and damage there and does have evidence on x-ray of "clips" of metal being left in his arm.  This also presents somewhat like a lipoma perhaps.  He is not significantly tender or red with no drainage or exudate, so OT does not feel this is indicative of infection.  Lt DRF, ORIF   TODAY'S  TREATMENT:  01/29/23: He starts with AROM for new measures which shows continued improvements.  We discuss FNL activities and weaning from his orthosis as listed below for light activities.  OT then does manual joint mobs, lightly, manual stretches at FA and wrist (also reviwing HEP), then he does several fnl activities with compression strap on that OT provided today to help support wrist while weaning orthosis.  He does well, has no pain, states understanding directions.   Activities: Cone flip for FA pro/sup x6 Bolts board with tool use Clothespin pickup for wrist flex/ext   "Now ~4 weeks post-op:    Still stretch at least 4 x day (or more if tolerated)  Still avoid lifting >10# for sure (or anything painful).   Can leave off brace in day for 10-30 mins, 3-4 times a day as tolerated to do light home tasks (like unload dishwasher, fold laundry, anything under 10# should be fine).   If your arm get tired/painful, put brace back on to relax.   Wear compression strap when out of brace is recommended with activities.  Try "spinning a pen" 10x each direction to work on light coordination task.   (4+ times a day)"    PATIENT EDUCATION: Education details: See tx section above for details  Person educated: Patient Education method: Verbal Instruction, Teach back, Handouts  Education comprehension: States and demonstrates understanding, Additional Education required    HOME EXERCISE PROGRAM: Access Code: [email or web address] URL: [email or web address] Date: 01/15/2023 Prepared by: Fannie Knee   GOALS: Goals reviewed with patient? Yes   SHORT TERM GOALS: (STG required if POC>30 days) Target Date: 02/01/2023  Pt will obtain protective, custom orthotic. Goal status: 01/15/2023: MET   2.  Pt will demo/state understanding of initial HEP to improve pain levels and prerequisite motion. Goal status: 01/24/2023: Met   LONG TERM GOALS: Target Date: 03/01/2023  Pt will improve  functional ability by decreased impairment per PRWE assessment from 55/10 to 20/100 or better, for better quality of life. Goal status: INITIAL  2.  Pt will improve grip strength in left hand from not tested at eval to at least 45 lbs for functional use at home and in IADLs. Goal status: INITIAL  3.  Pt will improve A/ROM in left wrist flexion/extension from 24/40 to at least 55 degrees each, to have functional motion for tasks like reach and grasp.  Goal status: INITIAL  4.  Pt will improve strength in left wrist flexion/extension from not tested at evaluation to at least 4+/5 MMT to have increased functional ability to carry out selfcare and higher-level homecare tasks with less difficulty. Goal status: INITIAL  5.  Pt will improve coordination skills in left arm, as seen by within functional limit score on box and blocks testing to have increased functional ability to carry out fine motor tasks (fasteners, etc.) and more complex, coordinated IADLs (meal prep, sports, etc.).  Goal status: INITIAL   ASSESSMENT:  CLINICAL IMPRESSION: 01/29/23: It is concerning that he had a fall in the bathroom but he blames it on slippery socks.  He would like to withhold physical therapy until he gets his new "oxygen machine" at night set up.  His wrist continues to improve and his hand is working well, we will be weaning and doing light functional activities now as well.  We will still withhold strength per protocols until better healed.  OT did cancel his second session this week as he has been doing excellent and shows good ability with his home exercise program.  We may move to 1 week therapy ongoing now.   PLAN:  OT FREQUENCY: 1-2x/week  OT DURATION: 6 weeks through 03/01/2023 and up to 12  total visits as needed  PLANNED INTERVENTIONS: 97168 OT Re-evaluation, 97535 self care/ADL training, 86578 therapeutic exercise, 97530 therapeutic activity, 97112 neuromuscular re-education, 97140 manual therapy,  97039 fluidotherapy, 97010 moist heat, 97010 cryotherapy, 97034 contrast bath, 97760 Orthotics management and training, 46962 Splinting (initial encounter), [phone] Subsequent splinting/medication, scar mobilization, compression bandaging, Dry needling, energy conservation, coping strategies training, and patient/family education  RECOMMENDED OTHER SERVICES: OT strongly recommends a referral to physical therapy for balance, gait, mobility and fall prevention.  OT will send a note to his PCP about this.  CONSULTED AND AGREED WITH PLAN OF CARE: Patient  PLAN FOR NEXT SESSION:   Continue with light functional activities, weaning from orthosis, do manual therapy as helpful, start to work on hand strengthening with therapy putty as tolerated   Fannie Knee, OTR/L, CHT 01/29/2023, 5:12 PM

## 2023-01-28 NOTE — Telephone Encounter (Signed)
Please let patient know overnight oximetry test on room air showed patient does qualify for nocturnal oxygen.  Results were borderline for oxygen need. Oxygen desaturation index was 11 events per hours which can be seen in patient with sleep apnea. Please ask if he has any symptoms concerning for sleep apnea, such as snoring waking up choking/gasping? If so may want to do HST first before ordering nocturnal oxygen  ONO 01/25/23 SpO2 low 82% with basal oxygen level 93%.  He spent 5 minutes with oxygen level less than 88%.

## 2023-01-29 ENCOUNTER — Telehealth: Payer: Self-pay | Admitting: Primary Care

## 2023-01-29 ENCOUNTER — Ambulatory Visit (INDEPENDENT_AMBULATORY_CARE_PROVIDER_SITE_OTHER): Payer: Medicare Other | Admitting: Rehabilitative and Restorative Service Providers"

## 2023-01-29 ENCOUNTER — Encounter: Payer: Self-pay | Admitting: Rehabilitative and Restorative Service Providers"

## 2023-01-29 DIAGNOSIS — M25532 Pain in left wrist: Secondary | ICD-10-CM

## 2023-01-29 DIAGNOSIS — R600 Localized edema: Secondary | ICD-10-CM | POA: Diagnosis not present

## 2023-01-29 DIAGNOSIS — R278 Other lack of coordination: Secondary | ICD-10-CM

## 2023-01-29 DIAGNOSIS — M6281 Muscle weakness (generalized): Secondary | ICD-10-CM

## 2023-01-29 DIAGNOSIS — M25632 Stiffness of left wrist, not elsewhere classified: Secondary | ICD-10-CM

## 2023-01-29 NOTE — Telephone Encounter (Signed)
Zott, Penni Bombard, [address]; Zott, Stacy; [place], Tammy [place], Patient refused the Oxygen said the MD told since his stats were borderline he could decline the order if he choose to.

## 2023-01-29 NOTE — Telephone Encounter (Signed)
That's fine. He originally told Ashlyn that he wanted to start nocturnal oxygen but I am ok with him holding off since results were borderline

## 2023-01-31 ENCOUNTER — Encounter: Payer: Medicare Other | Admitting: Rehabilitative and Restorative Service Providers"

## 2023-02-05 ENCOUNTER — Encounter: Payer: Medicare Other | Admitting: Rehabilitative and Restorative Service Providers"

## 2023-02-06 NOTE — Therapy (Signed)
 OUTPATIENT OCCUPATIONAL THERAPY TREATMENT NOTE  [name] MRN: [id number] DOB:Jul 24, 1950, 73 y.o., male Today's Date: 02/07/2023  PCP: Charlott Carrier, MD REFERRING PROVIDER: Arlinda Buster, MD   END OF SESSION:  OT End of Session - 02/07/23 1301     Visit Number 5    Number of Visits 12    Date for OT Re-Evaluation 03/01/23    Authorization Type Medicare    OT Start Time 1303    OT Stop Time 1345    OT Time Calculation (min) 42 min    Equipment Utilized During Treatment t-putty pink    Activity Tolerance Patient tolerated treatment well;No increased pain;Patient limited by fatigue    Behavior During Therapy [facility] for tasks assessed/performed              Past Medical History:  Diagnosis Date   Arthritis    Back pain    Radiates down both legs   BPH (benign prostatic hyperplasia)    Cancer (HCC) 2022   prostate cancer   COPD (chronic obstructive pulmonary disease) (HCC)    Dyspnea    Enlarged prostate    GERD (gastroesophageal reflux disease)    Glaucoma    H/O hiatal hernia    H/O wheezing    occ inhaler usage   Heart murmur    Hypertension    Lumbar foraminal stenosis    Macular degeneration    Meningioma (HCC) 2014   Treated with radiation   Neuromuscular disorder (HCC)    Neuropathy bilateral legs   PONV (postoperative nausea and vomiting)    Pulmonary nodules    resolved on 2019 follow up CT   S/P insertion of spinal cord stimulator    Past Surgical History:  Procedure Laterality Date   ABDOMINAL EXPOSURE N/A 03/16/2022   Procedure: ABDOMINAL EXPOSURE;  Surgeon: Gretta Lonni PARAS, MD;  Location: [facility] OR;  Service: Vascular;  Laterality: N/A;   ANTERIOR LUMBAR FUSION N/A 03/16/2022   Procedure: Anterior Lumbar Interbody Fusion - Lumbar Five-Sacral One with cage;  Surgeon: Louis Shove, MD;  Location: [facility] OR;  Service: Neurosurgery;  Laterality: N/A;   BACK SURGERY  06/08/2006   lam x3 cerv x 1   CERVICAL FUSION      COLONOSCOPY  2020   CYSTOSCOPY     73 yrs old   CYSTOSCOPY WITH URETHRAL DILATATION N/A 09/21/2021   Procedure: CYSTOSCOPY WITH BALLOON DILATION and OPTILUME BALLOON DILATION OF  URETHRAL DILATATION;  Surgeon: Elisabeth Valli BIRCH, MD;  Location: [facility] ORS;  Service: Urology;  Laterality: N/A;   EYE SURGERY     bil   GOLD SEED IMPLANT N/A 09/06/2020   Procedure: GOLD SEED IMPLANT/ PLACEMENT OF FIDUCIAL MARKERS;  Surgeon: Elisabeth Valli BIRCH, MD;  Location: [facility] ORS;  Service: Urology;  Laterality: N/A;   KNEE ARTHROSCOPY  01/02/2008   lft   KNEE ARTHROSCOPY Left 07/23/2012   Procedure: LEFT MEDIAL MENISCAL DEBRIDEMENT AND CHONDROPLASTY;  Surgeon: Dempsey LULLA Moan, MD;  Location: [facility] ORS;  Service: Orthopedics;  Laterality: Left;   LUMBAR FUSION  02/22/2014   DR POOL   PILONIDAL CYST EXCISION     SINUS EXPLORATION     SPACE OAR INSTILLATION N/A 09/06/2020   Procedure: SPACE OAR INSTILLATION;  Surgeon: Elisabeth Valli BIRCH, MD;  Location: [facility] ORS;  Service: Urology;  Laterality: N/A;   SPINAL CORD STIMULATOR INSERTION N/A 01/16/2018   Procedure: LUMBAR SPINAL CORD STIMULATOR INSERTION;  Surgeon: Joshua Alm RAMAN, MD;  Location: [facility] OR;  Service:  Neurosurgery;  Laterality: N/A;  LUMBAR SPINAL CORD STIMULATOR INSERTION   TRANSURETHRAL RESECTION OF PROSTATE N/A 09/06/2020   Procedure: TRANSURETHRAL RESECTION OF THE PROSTATE (TURP);  Surgeon: Elisabeth Valli BIRCH, MD;  Location: [facility] ORS;  Service: Urology;  Laterality: N/A;  2 HRS   WRIST GANGLION EXCISION     rt   WRIST SURGERY  01/02/2007   cyst lft   Patient Active Problem List   Diagnosis Date Noted   Acute heart failure with preserved ejection fraction (HCC) 11/12/2022   Mild mitral regurgitation 10/30/2022   Aortic valve stenosis 10/30/2022   COPD (chronic obstructive pulmonary disease) (HCC) 01/11/2022   GERD (gastroesophageal reflux disease) 01/11/2022   Hypercholesterolemia 01/11/2022   Hypertension 01/11/2022   Lumbago with sciatica, left side  01/11/2022   Lumbago with sciatica, right side 01/11/2022   Obesity (BMI 30.0-34.9) 01/11/2022   Other chronic pain 01/11/2022   Pulmonary nodule 01/11/2022   Lumbar foraminal stenosis 01/11/2022   Aortic ectasia (HCC) 01/11/2022   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   Malignant neoplasm of prostate (HCC) 07/12/2020   S/P insertion of spinal cord stimulator 01/16/2018   Back pain with history of spinal surgery 09/09/2017   Inflammation of sacroiliac joint (HCC) 09/20/2016   Opioid dependence (HCC) 01/10/2016   Swelling of lower limb 03/25/2014   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   Complains of low back pain 09/09/2013   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   Lumbosacral radiculitis 12/03/2012   Spinal stenosis of lumbar region 10/30/2012   Meningioma (HCC) 10/28/2012   Lumbosacral spondylosis without myelopathy 10/23/2012   Acute medial meniscal tear 07/22/2012   Dizziness 06/25/2012   Backache 06/25/2012   Benign neoplasm of cerebral meninges (HCC) 06/25/2012   Neck pain 06/25/2012   Lumbar stenosis with neurogenic claudication 05/08/2011    ONSET DATE: DOS 12/28/22  REFERRING DIAG:  F74.467 (ICD-10-CM) - Pain in left wrist  S52.572A (ICD-10-CM) - Other closed intra-articular fracture of distal end of left radius, initial encounter    THERAPY DIAG:  Localized edema  Muscle weakness (generalized)  Other lack of coordination  Stiffness of left wrist, not elsewhere classified  Pain in left wrist  Rationale for Evaluation and Treatment: Rehabilitation  PERTINENT HISTORY: Lt DRF s/p ORIF 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  Aside from stiffness, pain, swelling in the left nondominant arm, he states his balance is his biggest concern and he is worried about falling again.  He uses a rollator at baseline, also hx of lumbar fusions.    PRECAUTIONS: Other: allergy   to tape, low to moderate risk of falls ; RED FLAGS: None   WEIGHT BEARING RESTRICTIONS: Yes NWB Lt wrist now   SUBJECTIVE:   SUBJECTIVE STATEMENT: Now ~6 weeks post Lt DRF ORIF. He states no more falls, still asking how long he needs to wear his orthosis.    PAIN:  Are you having pain?   2-3/10 soreness now Lt wrist; as he is on oxycodone  at baseline for back pain, states no significant pain at rest in wrist now  FALLS: Has patient fallen in last 6 months? Yes. Number of falls at least this one, and he states his balance is poor and concerning to him-he will be considered a low to moderate risk of falls and OT will use close approximation for this during any standing or mobility in the session.  LIVING ENVIRONMENT: Lives with: lives with their spouse  Lives in: House/apartment Has following equipment at home: Vannie - 4 wheeled and shower chair  PLOF: Independent with basic ADLs, Independent with household mobility with device, Independent with community mobility with device, Independent with homemaking with ambulation, and Needs assistance with homemaking  PATIENT GOALS: To improve the use of his left nondominant hand and arm  NEXT MD VISIT: TBD   OBJECTIVE: (All objective assessments below are from initial evaluation on: 01/15/23 unless otherwise specified.)   HAND DOMINANCE: Right   ADLs: Overall ADLs: States decreased ability to grab, hold household objects, pain and difficulty to open containers, perform FMS tasks (manipulate fasteners on clothing), mild bathing problems as well.    FUNCTIONAL OUTCOME MEASURES: Eval: Patient Rated Wrist Evaluation (PRWE): Pain: 25/50; Function: 30/50; Total Score: 55/100 (Higher Score  =  More Pain and/or Debility)    UPPER EXTREMITY ROM     Shoulder to Wrist AROM Left eval Lt 01/22/23 Lt 01/24/23 Lt 01/29/23 Lt 02/07/23  Forearm supination 44 53  60 50  Forearm pronation  60 70  74 70  Wrist flexion 24 36 27 45 (68 Rt)  43  Wrist  extension 40 36 28 34 (52 Rt)  33  Wrist ulnar deviation 9 15     Wrist radial deviation 5 4     Functional dart thrower's motion (F-DTM) in ulnar flexion 19      F-DTM in radial extension  24      (Blank rows = not tested)   Hand AROM Left eval Lt 01/22/23  Full Fist Ability (or Gap to Distal Palmar Crease) Loose, full fist Full tight fist  Thumb Opposition  (Kapandji Scale)  8/10 9/10  (Blank rows = not tested)   UPPER EXTREMITY MMT:    Eval:  NT at eval due to recent and still healing injuries. Will be tested when appropriate.   MMT Left TBD  Shoulder flexion   Shoulder abduction   Shoulder adduction   Shoulder extension   Shoulder internal rotation   Shoulder external rotation   Middle trapezius   Lower trapezius   Elbow flexion   Elbow extension   Forearm supination   Forearm pronation   Wrist flexion   Wrist extension   Wrist ulnar deviation   Wrist radial deviation   (Blank rows = not tested)  HAND FUNCTION: 02/07/23:  Grip strength Left: 40.7lbs   Eval: Observed weakness in affected Lt hand. Details when safe.  (Grip strength Right: 70 lbs)   COORDINATION: TBD: BBT: TBD blocks today with Lt hand    Eval: Observed coordination impairments with affected Lt hand. Details tested when able Box and Blocks TEST Left: TBD blocks (TBD is [facility])   EDEMA:   Eval:  Mildly swollen in Lt hand and wrist today, 22cm circumferentially around distal wrist crease (19.8cm on the opposite arm)  OBSERVATIONS:   Eval: Does have an odd look to the surgical area in terms of a puffy and swollen and lumpy volar forearm.  He does state prior surgeries and damage there and does have evidence on x-ray of clips of metal being left in his arm.  This also presents somewhat like a lipoma perhaps.  He is not significantly tender or red with no drainage or exudate, so OT does not feel this is indicative of infection.  Lt DRF, ORIF   TODAY'S TREATMENT:  02/07/23: He starts with AROM for  new measures which shows no significant change in ROM since hurting himself, which is good.  He continues with AROM within fluidotherapy machine for exercise as well as heat for 5 mins, afterwhich he feels looser and better.   He then performs/reviews stretches, OT upgrades HEP to include new hand strength activities with therapy putty which he tolerates without pain.   Leaves with no questions for now.   Exercises - Forearm Supination Stretch  - 3-4 x daily - 3-5 reps - 15 sec hold - Wrist Flexion Stretch  - 4 x daily - 3-5 reps - 15 sec hold - Seated Wrist Extension Stretch  - 3-6 x daily - 3-5 reps - 15 hold - Seated Wrist Radial Deviation Stretch  - 3-5 x daily - 3-5 reps - 15 hold - Full Fist  - 2-3 x daily - 5 reps - Finger Extension Pizza!   - 2-3 x daily - 5 reps - Thumb Opposition with Putty  - 2-3 x daily - 5 reps    01/29/23: He starts with AROM for new measures which shows continued improvements.  We discuss FNL activities and weaning from his orthosis as listed below for light activities.  OT then does manual joint mobs, lightly, manual stretches at FA and wrist (also reviwing HEP), then he does several fnl activities with compression strap on that OT provided today to help support wrist while weaning orthosis.  He does well, has no pain, states understanding directions.   Activities: Cone flip for FA pro/sup x6 Bolts board with tool use Clothespin pickup for wrist flex/ext   Now ~4 weeks post-op:    Still stretch at least 4 x day (or more if tolerated)  Still avoid lifting >10# for sure (or anything painful).   Can leave off brace in day for 10-30 mins, 3-4 times a day as tolerated to do light home tasks (like unload dishwasher, fold laundry, anything under 10# should be fine).   If your arm get tired/painful, put brace back on to relax.   Wear compression strap when out of brace is recommended with activities.  Try "spinning a pen" 10x each direction to work on light  coordination task.   (4+ times a day)    PATIENT EDUCATION: Education details: See tx section above for details  Person educated: Patient Education method: Verbal Instruction, Teach back, Handouts  Education comprehension: States and demonstrates understanding, Additional Education required    HOME EXERCISE PROGRAM: Access Code: [email or web address] URL: [email or web address] Date: 01/15/2023 Prepared by: Melvenia Ada   GOALS: Goals reviewed with patient? Yes   SHORT TERM GOALS: (STG required if POC>30 days) Target Date: 02/01/2023  Pt will obtain protective, custom orthotic. Goal status: 01/15/2023: MET   2.  Pt will demo/state understanding of initial HEP to improve pain levels and prerequisite motion. Goal status: 01/24/2023: Met   LONG TERM GOALS: Target Date: 03/01/2023  Pt will improve functional ability by decreased impairment per PRWE assessment from 55/10 to 20/100 or better, for better quality of life. Goal status: INITIAL  2.  Pt will improve grip strength in left hand from not tested at eval to at least 45 lbs for functional use at home and in IADLs. Goal status: INITIAL  3.  Pt will improve A/ROM in left wrist flexion/extension from 24/40 to at least 55 degrees each, to have functional motion for tasks like reach and grasp.  Goal status: INITIAL  4.  Pt will improve strength in left wrist flexion/extension from not tested at evaluation to at least 4+/5 MMT to have increased functional ability to carry out selfcare  and higher-level homecare tasks with less difficulty. Goal status: INITIAL  5.  Pt will improve coordination skills in left arm, as seen by within functional limit score on box and blocks testing to have increased functional ability to carry out fine motor tasks (fasteners, etc.) and more complex, coordinated IADLs (meal prep, sports, etc.).  Goal status: INITIAL   ASSESSMENT:  CLINICAL IMPRESSION: 02/07/23: despite pushing himself to make  tight fist, feeling pop and pain, he has no pain after AROM, heat, stretches today and tolerates hand strength. Also discussed weaning orthosis carefully while seated, safe.  Carry on   01/29/23: It is concerning that he had a fall in the bathroom but he blames it on slippery socks.  He would like to withhold physical therapy until he gets his new oxygen machine at night set up.  His wrist continues to improve and his hand is working well, we will be weaning and doing light functional activities now as well.  We will still withhold strength per protocols until better healed.  OT did cancel his second session this week as he has been doing excellent and shows good ability with his home exercise program.  We may move to 1 week therapy ongoing now.   PLAN:  OT FREQUENCY: 1-2x/week  OT DURATION: 6 weeks through 03/01/2023 and up to 12 total visits as needed  PLANNED INTERVENTIONS: 97168 OT Re-evaluation, 97535 self care/ADL training, 02889 therapeutic exercise, 97530 therapeutic activity, 97112 neuromuscular re-education, 97140 manual therapy, 97039 fluidotherapy, 97010 moist heat, 97010 cryotherapy, 97034 contrast bath, 97760 Orthotics management and training, 02239 Splinting (initial encounter), [id number] Subsequent splinting/medication, scar mobilization, compression bandaging, Dry needling, energy conservation, coping strategies training, and patient/family education  RECOMMENDED OTHER SERVICES: OT strongly recommends a referral to physical therapy for balance, gait, mobility and fall prevention.  OT will send a note to his PCP about this.  CONSULTED AND AGREED WITH PLAN OF CARE: Patient  PLAN FOR NEXT SESSION:   Check putty strength, upgrade to wrist isos if tolerating all well.    Melvenia Ada, OTR/L, CHT 02/07/2023, 1:46 PM

## 2023-02-07 ENCOUNTER — Ambulatory Visit: Payer: Medicare Other | Admitting: Rehabilitative and Restorative Service Providers"

## 2023-02-07 ENCOUNTER — Encounter: Payer: Self-pay | Admitting: Rehabilitative and Restorative Service Providers"

## 2023-02-07 DIAGNOSIS — R278 Other lack of coordination: Secondary | ICD-10-CM

## 2023-02-07 DIAGNOSIS — M25632 Stiffness of left wrist, not elsewhere classified: Secondary | ICD-10-CM

## 2023-02-07 DIAGNOSIS — M25532 Pain in left wrist: Secondary | ICD-10-CM | POA: Diagnosis not present

## 2023-02-07 DIAGNOSIS — R600 Localized edema: Secondary | ICD-10-CM

## 2023-02-07 DIAGNOSIS — M6281 Muscle weakness (generalized): Secondary | ICD-10-CM

## 2023-02-11 NOTE — Progress Notes (Deleted)
 Cardiology Office Note   Date:  02/11/2023   ID:  Cody Tate, Cody Tate 1950-08-05, MRN [id number]  PCP:  Emilio Aspen, MD  Cardiologist:   Charlton Haws, MD   No chief complaint on file.     History of Present Illness:  73 y.o. with history of HTN, former smoker with COPD. 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   ***  Past Medical History:  Diagnosis Date   Arthritis    Back pain    Radiates down both legs   BPH (benign prostatic hyperplasia)    Cancer (HCC) 2022   prostate cancer   COPD (chronic obstructive pulmonary disease) (HCC)    Dyspnea    Enlarged prostate    GERD (gastroesophageal reflux disease)    Glaucoma    H/O hiatal hernia    H/O wheezing    occ inhaler usage   Heart murmur    Hypertension    Lumbar foraminal stenosis    Macular degeneration    Meningioma (HCC) 2014   Treated with radiation   Neuromuscular disorder (HCC)    Neuropathy bilateral legs   PONV (postoperative nausea and vomiting)    Pulmonary nodules    resolved on 2019 follow up CT   S/P insertion of spinal cord stimulator     Past Surgical History:  Procedure Laterality Date   ABDOMINAL EXPOSURE N/A 03/16/2022    Procedure: ABDOMINAL EXPOSURE;  Surgeon: Cephus Shelling, MD;  Location: [facility] OR;  Service: Vascular;  Laterality: N/A;   ANTERIOR LUMBAR FUSION N/A 03/16/2022   Procedure: Anterior Lumbar Interbody Fusion - Lumbar Five-Sacral One with cage;  Surgeon: Julio Sicks, MD;  Location: [facility] OR;  Service: Neurosurgery;  Laterality: N/A;   BACK SURGERY  06/08/2006   lam x3 cerv x 1   CERVICAL FUSION     COLONOSCOPY  2020   CYSTOSCOPY     73 yrs old   CYSTOSCOPY WITH URETHRAL DILATATION N/A 09/21/2021   Procedure: CYSTOSCOPY WITH BALLOON DILATION and OPTILUME BALLOON DILATION OF  URETHRAL DILATATION;  Surgeon: Noel Christmas, MD;  Location: [facility] ORS;  Service: Urology;  Laterality: N/A;   EYE SURGERY     bil   GOLD SEED IMPLANT N/A 09/06/2020   Procedure: GOLD SEED IMPLANT/ PLACEMENT OF FIDUCIAL MARKERS;  Surgeon: Noel Christmas, MD;  Location: [facility] ORS;  Service: Urology;  Laterality: N/A;   KNEE ARTHROSCOPY  01/02/2008   lft   KNEE ARTHROSCOPY Left 07/23/2012   Procedure: LEFT MEDIAL MENISCAL DEBRIDEMENT AND CHONDROPLASTY;  Surgeon: Loanne Drilling, MD;  Location: [facility] ORS;  Service: Orthopedics;  Laterality: Left;   LUMBAR FUSION  02/22/2014  DR POOL   PILONIDAL CYST EXCISION     SINUS EXPLORATION     SPACE OAR INSTILLATION N/A 09/06/2020   Procedure: SPACE OAR INSTILLATION;  Surgeon: Noel Christmas, MD;  Location: [facility] OR[facility];  Service: Urology;  Laterality: N/A;   SPINAL CORD STIMULATOR INSERTION N/A 01/16/2018   Procedure: LUMBAR SPINAL CORD STIMULATOR INSERTION;  Surgeon: Tia Alert, MD;  Location: [facility] OR;  Service: Neurosurgery;  Laterality: N/A;  LUMBAR SPINAL CORD STIMULATOR INSERTION   TRANSURETHRAL RESECTION OF PROSTATE N/A 09/06/2020   Procedure: TRANSURETHRAL RESECTION OF THE PROSTATE (TURP);  Surgeon: Noel Christmas, MD;  Location: [facility] ORS;  Service: Urology;  Laterality: N/A;  2 HRS   WRIST GANGLION EXCISION     rt   WRIST SURGERY  01/02/2007   cyst lft     Current  Outpatient Medications  Medication Sig Dispense Refill   Ascorbic Acid (VITAMIN C) 1000 MG tablet Take 1,000 mg by mouth daily.     Budeson-Glycopyrrol-Formoterol (BREZTRI AEROSPHERE) 160-9-4.8 MCG/ACT AERO Inhale 2 puffs into the lungs in the morning and at bedtime.     celecoxib (CELEBREX) 200 MG capsule TAKE 1 TO 2 CAPSULES BY MOUTH DAILY WITH FOOD for 90     chlorhexidine (HIBICLENS) 4 % external liquid Apply 1 Application topically daily as needed (boils).     Cholecalciferol (VITAMIN D3) 50 MCG (2000 UT) capsule Take 4,000 Units by mouth daily.     diphenhydrAMINE (BENADRYL ALLERGY) 25 MG tablet Take 25 mg by mouth at bedtime.     empagliflozin (JARDIANCE) 10 MG TABS tablet Take 1 tablet (10 mg total) by mouth daily before breakfast. 30 tablet 3   finasteride (PROSCAR) 5 MG tablet Take 5 mg by mouth daily.     fluticasone (FLONASE) 50 MCG/ACT nasal spray Place 2 sprays into both nostrils daily.      furosemide (LASIX) 20 MG tablet Take 1 tablet (20 mg total) by mouth daily.     gabapentin (NEURONTIN) 300 MG capsule Take 300 mg by mouth 4 (four) times daily.     Guaifenesin (MUCINEX MAXIMUM STRENGTH) 1200 MG TB12 Take 1,200 mg by mouth 2 (two) times daily.     ibuprofen (ADVIL,MOTRIN) 200 MG tablet Take 400 mg by mouth 4 (four) times daily as needed for moderate pain.     Ipratropium-Albuterol (COMBIVENT RESPIMAT) 20-100 MCG/ACT AERS respimat Inhale 1 puff into the lungs every 6 (six) hours as needed for wheezing or shortness of breath. 12 g 3   latanoprost (XALATAN) 0.005 % ophthalmic solution Place 1 drop into both eyes at bedtime.      Lutein-Zeaxanthin 25-5 MG CAPS Take by mouth.     magnesium hydroxide (MILK OF MAGNESIA) 400 MG/5ML suspension Take 15 mLs by mouth at bedtime.     metoprolol succinate (TOPROL-XL) 100 MG 24 hr tablet Take 100 mg by mouth at bedtime. Take with or immediately following a meal.     montelukast (SINGULAIR) 10 MG tablet TAKE 1 TABLET BY MOUTH AT  BEDTIME 90  tablet 3   Multiple Vitamin (MULTIVITAMIN WITH MINERALS) TABS tablet Take 1 tablet by mouth daily.     omeprazole (PRILOSEC) 40 MG capsule Take 1 capsule (40 mg total) by mouth daily before breakfast. 90 capsule 3   Oxycodone HCl 10 MG TABS Take 10 mg by mouth every 4 (four) hours as needed (pain).     oxymetazoline (AFRIN) 0.05 % nasal spray Place 2 sprays into both nostrils 2 (two) times daily as  needed for congestion.     Propylene Glycol, PF, (SYSTANE COMPLETE PF) 0.6 % SOLN Place 1 drop into both eyes 3 (three) times daily as needed (dry eyes).     pseudoephedrine (SUDAFED) 30 MG tablet Take 60 mg by mouth 3 (three) times daily.     Simethicone 125 MG CAPS Take 250 mg by mouth 4 (four) times daily as needed (for gas).     simvastatin (ZOCOR) 20 MG tablet Take 20 mg by mouth at bedtime.      sodium chloride (OCEAN) 0.65 % SOLN nasal spray Place 1 spray into both nostrils as needed for congestion. As needed     spironolactone (ALDACTONE) 25 MG tablet Take 12.5 mg by mouth at bedtime. On HOLD as of 11/19/22 per Perlie Gold, PA-C (Patient not taking: Reported on 01/16/2023)     Tamsulosin HCl (FLOMAX) 0.4 MG CAPS Take 0.4 mg by mouth 2 (two) times daily.     telmisartan (MICARDIS) 40 MG tablet Take 40 mg by mouth daily.     timolol (TIMOPTIC) 0.5 % ophthalmic solution 1 drop every morning.     TRELEGY ELLIPTA 200-62.5-25 MCG/ACT AEPB USE 1 INHALATION BY MOUTH ONCE  DAILY AT THE SAME TIME EACH DAY 180 each 3   No current facility-administered medications for this visit.    Allergies:   Other and Tape    Social History:  The patient  reports that he quit smoking about 22 years ago. His smoking use included cigarettes. He started smoking about 57 years ago. He has a 52.5 pack-year smoking history. He has never used smokeless tobacco. He reports that he does not currently use alcohol. He reports that he does not use drugs.   Family History:  The patient's family history includes Bone cancer in  his mother; COPD in his maternal uncle; Hypertension in his mother; Stroke (age of onset: 71) in his father.    ROS:  Please see the history of present illness.   Otherwise, review of systems are positive for none.   All other systems are reviewed and negative.    PHYSICAL EXAM: VS:  There were no vitals taken for this visit. , BMI There is no height or weight on file to calculate BMI. Affect appropriate Healthy:  appears stated age HEENT: normal Neck supple with no adenopathy JVP normal no bruits no thyromegaly Lungs diffuse rhonchi and  wheezing and good diaphragmatic motion Heart:  S1/S2 SEM murmur, no rub, gallop or click PMI normal Abdomen: benighn, BS positve, no tenderness, no AAA no bruit.  No HSM or HJR Distal pulses intact with no bruits Plus 2 bilateral edema Neuro non-focal Skin warm and dry Previous lumbar fusion with nerve stimulator in place     EKG:  02/11/2023 NSR rate 88 normal    Recent Labs: 10/30/2022: ALT 33; TSH 0.894 11/15/2022: Hemoglobin 14.3; Platelets 455.0; Pro B Natriuretic peptide (BNP) 119.0 11/28/2022: BUN 18; Creatinine, Ser 0.85; Potassium 5.0; Sodium 140    Lipid Panel No results found for: "CHOL", "TRIG", "HDL", "CHOLHDL", "VLDL", "LDLCALC", "LDLDIRECT"    Wt Readings from Last 3 Encounters:  01/16/23 235 lb 6.4 oz (106.8 kg)  11/15/22 227 lb (103 kg)  11/12/22 229 lb 3.2 oz (104 kg)      Other studies Reviewed: Myovue 01/10/18 Echo 01/10/18  .    ASSESSMENT AND PLAN:  1. Abnormal ECG:  Lateral T wave changes ? From HTN non ischemic myovue 2020  2. COPD: no active wheezing former smoker f/u  Dr Judeth Horn Inhalers  Oxymetry with desats qualifies for oxygen but ? Still needing sleep study 3. HTN:  f/u [place] Weight loss/ DASH diet Continue micardis, Toprol, aldactone and lasix  4. HLD  Continue statin labs with Dr Pete Glatter  5. AS:  Mild  mean gradient 7 peak 14 mmhg TTE done 11/06/22   Last TTE 06/27/20 no gradient Recorded  just AV sclerosis Observe  6. Back Pain: post nerve stimulator placement Dr Yetta Barre 01/16/18 previous L1-5 fusion  7. Edema:  Dependant from obesity continue diuretics BNP normal  8. Prostate:  PSA normal continue flomax and proscar post dilatation of urethral stricture f/u Dr Arita Miss 9. Meningioma:  asymptomatic f/u imaging per neuro    Current medicines are reviewed at length with the patient today.  The patient does not have concerns regarding medicines.  The following changes have been made:   None   Labs/ tests ordered today include:   None    No orders of the defined types were placed in this encounter.    Disposition:   FU with cardiology in a year      Signed, Charlton Haws, MD  02/11/2023 4:14 PM    [facility] [address] [place], [place], [place]  [address] Phone: [phone]; Fax: [phone]

## 2023-02-11 NOTE — Therapy (Signed)
OUTPATIENT OCCUPATIONAL THERAPY TREATMENT NOTE  [name] MRN: [id number] DOB:October 28, 1950, 73 y.o., male Today's Date: 02/12/2023  PCP: Eleanora Neighbor, MD REFERRING PROVIDER: Samuella Cota, MD   END OF SESSION:  OT End of Session - 02/12/23 1307     Visit Number 6    Number of Visits 12    Date for OT Re-Evaluation 03/01/23    Authorization Type Medicare    OT Start Time 1306    OT Stop Time 1344    OT Time Calculation (min) 38 min    Equipment Utilized During Treatment --    Activity Tolerance Patient tolerated treatment well;No increased pain;Patient limited by fatigue    Behavior During Therapy [facility] for tasks assessed/performed               Past Medical History:  Diagnosis Date   Arthritis    Back pain    Radiates down both legs   BPH (benign prostatic hyperplasia)    Cancer (HCC) 2022   prostate cancer   COPD (chronic obstructive pulmonary disease) (HCC)    Dyspnea    Enlarged prostate    GERD (gastroesophageal reflux disease)    Glaucoma    H/O hiatal hernia    H/O wheezing    occ inhaler usage   Heart murmur    Hypertension    Lumbar foraminal stenosis    Macular degeneration    Meningioma (HCC) 2014   Treated with radiation   Neuromuscular disorder (HCC)    Neuropathy bilateral legs   PONV (postoperative nausea and vomiting)    Pulmonary nodules    resolved on 2019 follow up CT   S/P insertion of spinal cord stimulator    Past Surgical History:  Procedure Laterality Date   ABDOMINAL EXPOSURE N/A 03/16/2022   Procedure: ABDOMINAL EXPOSURE;  Surgeon: Cephus Shelling, MD;  Location: [facility] OR;  Service: Vascular;  Laterality: N/A;   ANTERIOR LUMBAR FUSION N/A 03/16/2022   Procedure: Anterior Lumbar Interbody Fusion - Lumbar Five-Sacral One with cage;  Surgeon: Julio Sicks, MD;  Location: [facility] OR;  Service: Neurosurgery;  Laterality: N/A;   BACK SURGERY  06/08/2006   lam x3 cerv x 1   CERVICAL FUSION     COLONOSCOPY  2020    CYSTOSCOPY     73 yrs old   CYSTOSCOPY WITH URETHRAL DILATATION N/A 09/21/2021   Procedure: CYSTOSCOPY WITH BALLOON DILATION and OPTILUME BALLOON DILATION OF  URETHRAL DILATATION;  Surgeon: Noel Christmas, MD;  Location: [facility] ORS;  Service: Urology;  Laterality: N/A;   EYE SURGERY     bil   GOLD SEED IMPLANT N/A 09/06/2020   Procedure: GOLD SEED IMPLANT/ PLACEMENT OF FIDUCIAL MARKERS;  Surgeon: Noel Christmas, MD;  Location: [facility] ORS;  Service: Urology;  Laterality: N/A;   KNEE ARTHROSCOPY  01/02/2008   lft   KNEE ARTHROSCOPY Left 07/23/2012   Procedure: LEFT MEDIAL MENISCAL DEBRIDEMENT AND CHONDROPLASTY;  Surgeon: Loanne Drilling, MD;  Location: [facility] ORS;  Service: Orthopedics;  Laterality: Left;   LUMBAR FUSION  02/22/2014   DR POOL   PILONIDAL CYST EXCISION     SINUS EXPLORATION     SPACE OAR INSTILLATION N/A 09/06/2020   Procedure: SPACE OAR INSTILLATION;  Surgeon: Noel Christmas, MD;  Location: [facility] ORS;  Service: Urology;  Laterality: N/A;   SPINAL CORD STIMULATOR INSERTION N/A 01/16/2018   Procedure: LUMBAR SPINAL CORD STIMULATOR INSERTION;  Surgeon: Tia Alert, MD;  Location[facility];  Service:  Neurosurgery;  Laterality: N/A;  LUMBAR SPINAL CORD STIMULATOR INSERTION   TRANSURETHRAL RESECTION OF PROSTATE N/A 09/06/2020   Procedure: TRANSURETHRAL RESECTION OF THE PROSTATE (TURP);  Surgeon: Noel Christmas, MD;  Location: [facility] ORS;  Service: Urology;  Laterality: N/A;  2 HRS   WRIST GANGLION EXCISION     rt   WRIST SURGERY  01/02/2007   cyst lft   Patient Active Problem List   Diagnosis Date Noted   Acute heart failure with preserved ejection fraction (HCC) 11/12/2022   Mild mitral regurgitation 10/30/2022   Aortic valve stenosis 10/30/2022   COPD (chronic obstructive pulmonary disease) (HCC) 01/11/2022   GERD (gastroesophageal reflux disease) 01/11/2022   Hypercholesterolemia 01/11/2022   Hypertension 01/11/2022   Lumbago with sciatica, left side 01/11/2022   Lumbago  with sciatica, right side 01/11/2022   Obesity (BMI 30.0-34.9) 01/11/2022   Other chronic pain 01/11/2022   Pulmonary nodule 01/11/2022   Lumbar foraminal stenosis 01/11/2022   Aortic ectasia (HCC) 01/11/2022   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   Malignant neoplasm of prostate (HCC) 07/12/2020   S/P insertion of spinal cord stimulator 01/16/2018   Back pain with history of spinal surgery 09/09/2017   Inflammation of sacroiliac joint (HCC) 09/20/2016   Opioid dependence (HCC) 01/10/2016   Swelling of lower limb 03/25/2014   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   Complains of low back pain 09/09/2013   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   Lumbosacral radiculitis 12/03/2012   Spinal stenosis of lumbar region 10/30/2012   Meningioma (HCC) 10/28/2012   Lumbosacral spondylosis without myelopathy 10/23/2012   Acute medial meniscal tear 07/22/2012   Dizziness 06/25/2012   Backache 06/25/2012   Benign neoplasm of cerebral meninges (HCC) 06/25/2012   Neck pain 06/25/2012   Lumbar stenosis with neurogenic claudication 05/08/2011    ONSET DATE: DOS 12/28/22  REFERRING DIAG:  Z61.096 (ICD-10-CM) - Pain in left wrist  S52.572A (ICD-10-CM) - Other closed intra-articular fracture of distal end of left radius, initial encounter    THERAPY DIAG:  Localized edema  Muscle weakness (generalized)  Pain in left wrist  Stiffness of left wrist, not elsewhere classified  Other lack of coordination  Rationale for Evaluation and Treatment: Rehabilitation  PERTINENT HISTORY: Lt DRF s/p ORIF 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  Aside from stiffness, pain, swelling in the left nondominant arm, he states his balance is his biggest concern and he is worried about falling again.  He uses a rollator at baseline, also hx of lumbar fusions.    PRECAUTIONS: Other: allergy to tape, low to  moderate risk of falls ; RED FLAGS: None   WEIGHT BEARING RESTRICTIONS: Yes NWB Lt wrist now   SUBJECTIVE:   SUBJECTIVE STATEMENT: Now 6+ weeks post Lt DRF ORIF. He states no more wrist pain or soreness, therapy putty has been working well, wearing compressive wrist strap and has not felt need to wear a rigid orthosis.    PAIN:  Are you having pain?   0/10 no pain or soreness now   FALLS: Has patient fallen in last 6 months? Yes. Number of falls at least this one, and he states his balance is poor and concerning to him-he will be considered a low to moderate risk of falls and OT will use close approximation for this during any standing or mobility in the session.  LIVING ENVIRONMENT: Lives with: lives with their spouse Lives in: House/apartment Has  following equipment at home: Dan Humphreys - 4 wheeled and shower chair  PLOF: Independent with basic ADLs, Independent with household mobility with device, Independent with community mobility with device, Independent with homemaking with ambulation, and Needs assistance with homemaking  PATIENT GOALS: To improve the use of his left nondominant hand and arm  NEXT MD VISIT: TBD   OBJECTIVE: (All objective assessments below are from initial evaluation on: 01/15/23 unless otherwise specified.)   HAND DOMINANCE: Right   ADLs: Overall ADLs: States decreased ability to grab, hold household objects, pain and difficulty to open containers, perform FMS tasks (manipulate fasteners on clothing), mild bathing problems as well.    FUNCTIONAL OUTCOME MEASURES: Eval: Patient Rated Wrist Evaluation (PRWE): Pain: 25/50; Function: 30/50; Total Score: 55/100 (Higher Score  =  More Pain and/or Debility)    UPPER EXTREMITY ROM     Shoulder to Wrist AROM Left eval Lt 01/22/23 Lt 01/24/23 Lt 01/29/23 Lt 02/07/23 Lt 02/12/23  Forearm supination 44 53  60 50 56  Forearm pronation  60 70  74 70 82  Wrist flexion 24 36 27 45 (68 Rt)  43 53  Wrist extension 40  36 28 34 (52 Rt)  33 45  Wrist ulnar deviation 9 15    18   Wrist radial deviation 5 4    20   Functional dart thrower's motion (F-DTM) in ulnar flexion 19       F-DTM in radial extension  24       (Blank rows = not tested)   Hand AROM Left eval Lt 01/22/23  Full Fist Ability (or Gap to Distal Palmar Crease) Loose, full fist Full tight fist  Thumb Opposition  (Kapandji Scale)  8/10 9/10  (Blank rows = not tested)   UPPER EXTREMITY MMT:     MMT Left 02/12/23  Elbow flexion 5/5  Elbow extension 4/5 shake  Forearm supination 4/5  Forearm pronation 5/5  Wrist flexion 4+/5  Wrist extension 4-/5 tender  Wrist ulnar deviation 4+/5  Wrist radial deviation 4+/5  (Blank rows = not tested)  HAND FUNCTION: 02/12/23: Lt grip: 46#  no pain  02/07/23:  Grip strength Left: 40.7lbs   Eval: Observed weakness in affected Lt hand. Details when safe.  (Grip strength Right: 70 lbs)   COORDINATION: 02/12/23:  BBT: 47 blocks today with Lt hand (45 is WFL, 68 is AVG)    EDEMA:   Eval:  Mildly swollen in Lt hand and wrist today, 22cm circumferentially around distal wrist crease (19.8cm on the opposite arm)  OBSERVATIONS:   Eval: Does have an odd look to the surgical area in terms of a puffy and swollen and lumpy volar forearm.  He does state prior surgeries and damage there and does have evidence on x-ray of "clips" of metal being left in his arm.  This also presents somewhat like a lipoma perhaps.  He is not significantly tender or red with no drainage or exudate, so OT does not feel this is indicative of infection.  Lt DRF, ORIF   TODAY'S TREATMENT:  02/12/23: He performs active range of motion which shows excellent improvement at the forearm and wrist.  His hand continues to be flexible and supple.  Putty has made his hand stronger just after a few days when checked, and he is encouraged to continue this 2-3 times a day as tolerated, and putty activities were quickly reviewed today.  Next, his  home exercise program is upgraded to include wrist strengthening gently with isometrics.  These new for strengthening exercises are listed below.  He is encouraged to only push as hard as he can handle without pain and build this up over a week.  He demonstrates back each exercise for understanding having some issues with his poor vision due to macular degeneration.  He does do the blocks and blocks Test for coordination assessment which is within functional limits now though at the lower limit.  He leaves with no questions is wearing his compressive wrist strap but no more rigid orthosis and doing well with that.   Exercises - Forearm Supination Stretch  - 3-4 x daily - 3-5 reps - 15 sec hold - Wrist Flexion Stretch  - 4 x daily - 3-5 reps - 15 sec hold - Seated Wrist Extension Stretch  - 3-6 x daily - 3-5 reps - 15 hold - Full Fist  - 2-3 x daily - 5 reps - Finger Extension "Pizza!"   - 2-3 x daily - 5 reps - Thumb Opposition with Putty  - 2-3 x daily - 5 reps - Isometric Wrist Extension Pronated  - 2-3 x daily - 4-5 x weekly - 5 - 10 reps - 5-10 seconds hold - Seated Isometric Wrist Flexion Neutral  - 4-6 x daily - 1 sets - 10-15 reps - Isometric Wrist Extension Neutral  - 4-6 x daily - 1 sets - 10-15 reps - Seated Isometric Wrist Radial Deviation with Manual Resistance  - 4-6 x daily - 1 sets - 10-15 reps    PATIENT EDUCATION: Education details: See tx section above for details  Person educated: Patient Education method: Verbal Instruction, Teach back, Handouts  Education comprehension: States and demonstrates understanding, Additional Education required    HOME EXERCISE PROGRAM: Access Code: [email or web address] URL: [email or web address] Date: 01/15/2023 Prepared by: Fannie Knee   GOALS: Goals reviewed with patient? Yes   SHORT TERM GOALS: (STG required if POC>30 days) Target Date: 02/01/2023  Pt will obtain protective, custom orthotic. Goal status: 01/15/2023: MET    2.  Pt will demo/state understanding of initial HEP to improve pain levels and prerequisite motion. Goal status: 01/24/2023: Met   LONG TERM GOALS: Target Date: 03/01/2023  Pt will improve functional ability by decreased impairment per PRWE assessment from 55/10 to 20/100 or better, for better quality of life. Goal status: INITIAL  2.  Pt will improve grip strength in left hand from not tested at eval to at least 45 lbs for functional use at home and in IADLs. Goal status: INITIAL  3.  Pt will improve A/ROM in left wrist flexion/extension from 24/40 to at least 55 degrees each, to have functional motion for tasks like reach and grasp.  Goal status: INITIAL  4.  Pt will improve strength in left wrist flexion/extension from not tested at evaluation to at least 4+/5 MMT to have increased functional ability to carry out selfcare and higher-level homecare tasks with less difficulty. Goal status: INITIAL  5.  Pt will improve coordination skills in left arm, as seen by within functional limit score on box and blocks testing to have increased functional ability to carry out fine motor tasks (fasteners, etc.) and more complex, coordinated IADLs (meal prep, sports, etc.).  Goal status: INITIAL   ASSESSMENT:  CLINICAL IMPRESSION: 02/12/23: Motion and strength are up and now he is working on his forearm and wrist strengthening gently with isometrics.  Carry on an upgrade to whole arm strengthening next session likely discharging in 1-2 additional visits.    PLAN:  OT FREQUENCY: 1-2x/week  OT DURATION: 6 weeks through 03/01/2023 and up to 12 total visits as needed  PLANNED INTERVENTIONS: 97168 OT Re-evaluation, 97535 self care/ADL training, 86578 therapeutic exercise, 97530 therapeutic activity, 97112 neuromuscular re-education, 97140 manual therapy, 97039 fluidotherapy, 97010 moist heat, 97010 cryotherapy, 97034 contrast bath, 97760 Orthotics management and training, 46962 Splinting (initial  encounter), [id number] Subsequent splinting/medication, scar mobilization, compression bandaging, Dry needling, energy conservation, coping strategies training, and patient/family education  RECOMMENDED OTHER SERVICES: OT strongly recommends a referral to physical therapy for balance, gait, mobility and fall prevention.  OT will send a note to his PCP about this.  CONSULTED AND AGREED WITH PLAN OF CARE: Patient  PLAN FOR NEXT SESSION:   Check new wrist isometric strengthening, upgrade to whole arm strengthening and therapy band strengthening.  Consider discharge in 1-2 visits if all goals are met   Fannie Knee, OTR/L, CHT 02/12/2023, 1:48 PM

## 2023-02-12 ENCOUNTER — Encounter: Payer: Self-pay | Admitting: Rehabilitative and Restorative Service Providers"

## 2023-02-12 ENCOUNTER — Encounter: Payer: Self-pay | Admitting: Primary Care

## 2023-02-12 ENCOUNTER — Ambulatory Visit (INDEPENDENT_AMBULATORY_CARE_PROVIDER_SITE_OTHER): Payer: Medicare Other | Admitting: Rehabilitative and Restorative Service Providers"

## 2023-02-12 DIAGNOSIS — M25532 Pain in left wrist: Secondary | ICD-10-CM | POA: Diagnosis not present

## 2023-02-12 DIAGNOSIS — M6281 Muscle weakness (generalized): Secondary | ICD-10-CM

## 2023-02-12 DIAGNOSIS — R278 Other lack of coordination: Secondary | ICD-10-CM

## 2023-02-12 DIAGNOSIS — M25632 Stiffness of left wrist, not elsewhere classified: Secondary | ICD-10-CM

## 2023-02-12 DIAGNOSIS — R600 Localized edema: Secondary | ICD-10-CM

## 2023-02-13 DIAGNOSIS — C61 Malignant neoplasm of prostate: Secondary | ICD-10-CM | POA: Diagnosis not present

## 2023-02-18 NOTE — Therapy (Signed)
OUTPATIENT OCCUPATIONAL THERAPY TREATMENT NOTE  [name] MRN: [id number] DOB:07-28-1950, 73 y.o., male Today's Date: 02/18/2023  PCP: Eleanora Neighbor, MD REFERRING PROVIDER: Samuella Cota, MD   END OF SESSION:      Past Medical History:  Diagnosis Date   Arthritis    Back pain    Radiates down both legs   BPH (benign prostatic hyperplasia)    Cancer (HCC) 2022   prostate cancer   COPD (chronic obstructive pulmonary disease) (HCC)    Dyspnea    Enlarged prostate    GERD (gastroesophageal reflux disease)    Glaucoma    H/O hiatal hernia    H/O wheezing    occ inhaler usage   Heart murmur    Hypertension    Lumbar foraminal stenosis    Macular degeneration    Meningioma (HCC) 2014   Treated with radiation   Neuromuscular disorder (HCC)    Neuropathy bilateral legs   PONV (postoperative nausea and vomiting)    Pulmonary nodules    resolved on 2019 follow up CT   S/P insertion of spinal cord stimulator    Past Surgical History:  Procedure Laterality Date   ABDOMINAL EXPOSURE N/A 03/16/2022   Procedure: ABDOMINAL EXPOSURE;  Surgeon: Cephus Shelling, MD;  Location: [facility] OR;  Service: Vascular;  Laterality: N/A;   ANTERIOR LUMBAR FUSION N/A 03/16/2022   Procedure: Anterior Lumbar Interbody Fusion - Lumbar Five-Sacral One with cage;  Surgeon: Julio Sicks, MD;  Location: [facility] OR;  Service: Neurosurgery;  Laterality: N/A;   BACK SURGERY  06/08/2006   lam x3 cerv x 1   CERVICAL FUSION     COLONOSCOPY  2020   CYSTOSCOPY     73 yrs old   CYSTOSCOPY WITH URETHRAL DILATATION N/A 09/21/2021   Procedure: CYSTOSCOPY WITH BALLOON DILATION and OPTILUME BALLOON DILATION OF  URETHRAL DILATATION;  Surgeon: Noel Christmas, MD;  Location: [facility] ORS;  Service: Urology;  Laterality: N/A;   EYE SURGERY     bil   GOLD SEED IMPLANT N/A 09/06/2020   Procedure: GOLD SEED IMPLANT/ PLACEMENT OF FIDUCIAL MARKERS;  Surgeon: Noel Christmas, MD;  Location: [facility] ORS;   Service: Urology;  Laterality: N/A;   KNEE ARTHROSCOPY  01/02/2008   lft   KNEE ARTHROSCOPY Left 07/23/2012   Procedure: LEFT MEDIAL MENISCAL DEBRIDEMENT AND CHONDROPLASTY;  Surgeon: Loanne Drilling, MD;  Location: [facility] ORS;  Service: Orthopedics;  Laterality: Left;   LUMBAR FUSION  02/22/2014   DR POOL   PILONIDAL CYST EXCISION     SINUS EXPLORATION     SPACE OAR INSTILLATION N/A 09/06/2020   Procedure: SPACE OAR INSTILLATION;  Surgeon: Noel Christmas, MD;  Location: [facility] ORS;  Service: Urology;  Laterality: N/A;   SPINAL CORD STIMULATOR INSERTION N/A 01/16/2018   Procedure: LUMBAR SPINAL CORD STIMULATOR INSERTION;  Surgeon: Tia Alert, MD;  Location: [facility] OR;  Service: Neurosurgery;  Laterality: N/A;  LUMBAR SPINAL CORD STIMULATOR INSERTION   TRANSURETHRAL RESECTION OF PROSTATE N/A 09/06/2020   Procedure: TRANSURETHRAL RESECTION OF THE PROSTATE (TURP);  Surgeon: Noel Christmas, MD;  Location: [facility] ORS;  Service: Urology;  Laterality: N/A;  2 HRS   WRIST GANGLION EXCISION     rt   WRIST SURGERY  01/02/2007   cyst lft   Patient Active Problem List   Diagnosis Date Noted   Acute heart failure with preserved ejection fraction (HCC) 11/12/2022   Mild mitral regurgitation 10/30/2022   Aortic valve stenosis 10/30/2022  COPD (chronic obstructive pulmonary disease) (HCC) 01/11/2022   GERD (gastroesophageal reflux disease) 01/11/2022   Hypercholesterolemia 01/11/2022   Hypertension 01/11/2022   Lumbago with sciatica, left side 01/11/2022   Lumbago with sciatica, right side 01/11/2022   Obesity (BMI 30.0-34.9) 01/11/2022   Other chronic pain 01/11/2022   Pulmonary nodule 01/11/2022   Lumbar foraminal stenosis 01/11/2022   Aortic ectasia (HCC) 01/11/2022   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   Malignant neoplasm of prostate (HCC) 07/12/2020   S/P insertion of spinal cord stimulator 01/16/2018   Back pain with history of spinal surgery 09/09/2017    Inflammation of sacroiliac joint (HCC) 09/20/2016   Opioid dependence (HCC) 01/10/2016   Swelling of lower limb 03/25/2014   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   Complains of low back pain 09/09/2013   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   Lumbosacral radiculitis 12/03/2012   Spinal stenosis of lumbar region 10/30/2012   Meningioma (HCC) 10/28/2012   Lumbosacral spondylosis without myelopathy 10/23/2012   Acute medial meniscal tear 07/22/2012   Dizziness 06/25/2012   Backache 06/25/2012   Benign neoplasm of cerebral meninges (HCC) 06/25/2012   Neck pain 06/25/2012   Lumbar stenosis with neurogenic claudication 05/08/2011    ONSET DATE: DOS 12/28/22  REFERRING DIAG:  Y86.578 (ICD-10-CM) - Pain in left wrist  S52.572A (ICD-10-CM) - Other closed intra-articular fracture of distal end of left radius, initial encounter    THERAPY DIAG:  No diagnosis found.  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  Aside from stiffness, pain, swelling in the left nondominant arm, he states his balance is his biggest concern and he is worried about falling again.  He uses a rollator at baseline, also hx of lumbar fusions.    PRECAUTIONS: Other: allergy to tape, low to moderate risk of falls ; RED FLAGS: None   WEIGHT BEARING RESTRICTIONS: Yes NWB Lt wrist now   SUBJECTIVE:   SUBJECTIVE STATEMENT: Now 7+ weeks post Lt DRF ORIF. He states ***  no more wrist pain or soreness, therapy putty has been working well, wearing compressive wrist strap and has not felt need to wear a rigid orthosis.    PAIN:  Are you having pain?   0/10 no pain or soreness now   FALLS: Has patient fallen in last 6 months? Yes. Number of falls at least this one, and he states his balance is poor and concerning to him-he will be considered a low to  moderate risk of falls and OT will use close approximation for this during any standing or mobility in the session.  LIVING ENVIRONMENT: Lives with: lives with their spouse Lives in: House/apartment Has following equipment at home: Environmental consultant - 4 wheeled and shower chair  PLOF: Independent with basic ADLs, Independent with household mobility with device, Independent with community mobility with device, Independent with homemaking with ambulation, and Needs assistance with homemaking  PATIENT GOALS: To improve the use of his left nondominant hand and arm  NEXT MD VISIT: TBD   OBJECTIVE: (All objective assessments below are from initial evaluation on: 01/15/23 unless otherwise specified.)   HAND DOMINANCE: Right   ADLs: Overall ADLs: States decreased ability to grab, hold household objects, pain and difficulty to open containers, perform FMS tasks (manipulate fasteners on clothing), mild bathing problems as well.    FUNCTIONAL OUTCOME MEASURES: Eval: Patient Rated Wrist Evaluation (  PRWE): Pain: 25/50; Function: 30/50; Total Score: 55/100 (Higher Score  =  More Pain and/or Debility)    UPPER EXTREMITY ROM     Shoulder to Wrist AROM Left eval Lt 01/22/23 Lt 01/24/23 Lt 01/29/23 Lt 02/07/23 Lt 02/12/23  Forearm supination 44 53  60 50 56  Forearm pronation  60 70  74 70 82  Wrist flexion 24 36 27 45 (68 Rt)  43 53  Wrist extension 40 36 28 34 (52 Rt)  33 45  Wrist ulnar deviation 9 15    18   Wrist radial deviation 5 4    20   Functional dart thrower's motion (F-DTM) in ulnar flexion 19       F-DTM in radial extension  24       (Blank rows = not tested)   Hand AROM Left eval Lt 01/22/23  Full Fist Ability (or Gap to Distal Palmar Crease) Loose, full fist Full tight fist  Thumb Opposition  (Kapandji Scale)  8/10 9/10  (Blank rows = not tested)   UPPER EXTREMITY MMT:     MMT Left 02/12/23 Left 02/19/2023  Elbow flexion 5/5   Elbow extension 4/5 shake ***/5  Forearm supination  4/5   Forearm pronation 5/5   Wrist flexion 4+/5   Wrist extension 4-/5 tender   Wrist ulnar deviation 4+/5   Wrist radial deviation 4+/5   (Blank rows = not tested)  HAND FUNCTION: 02/12/23: Lt grip: 46#  no pain  02/07/23:  Grip strength Left: 40.7lbs   Eval: Observed weakness in affected Lt hand. Details when safe.  (Grip strength Right: 70 lbs)   COORDINATION: 02/12/23:  BBT: 47 blocks today with Lt hand (45 is WFL, 68 is AVG)    EDEMA:   Eval:  Mildly swollen in Lt hand and wrist today, 22cm circumferentially around distal wrist crease (19.8cm on the opposite arm)  OBSERVATIONS:   Eval: Does have an odd look to the surgical area in terms of a puffy and swollen and lumpy volar forearm.  He does state prior surgeries and damage there and does have evidence on x-ray of "clips" of metal being left in his arm.  This also presents somewhat like a lipoma perhaps.  He is not significantly tender or red with no drainage or exudate, so OT does not feel this is indicative of infection.  Lt DRF, ORIF   TODAY'S TREATMENT:  02/19/2023:  Check new wrist isometric strengthening, upgrade to whole arm strengthening and therapy band strengthening.  Consider discharge in 1-2 visits if all goals are met  02/12/23: He performs active range of motion which shows excellent improvement at the forearm and wrist.  His hand continues to be flexible and supple.  Putty has made his hand stronger just after a few days when checked, and he is encouraged to continue this 2-3 times a day as tolerated, and putty activities were quickly reviewed today.  Next, his home exercise program is upgraded to include wrist strengthening gently with isometrics.  These new for strengthening exercises are listed below.  He is encouraged to only push as hard as he can handle without pain and build this up over a week.  He demonstrates back each exercise for understanding having some issues with his poor vision due to macular  degeneration.  He does do the blocks and blocks Test for coordination assessment which is within functional limits now though at the lower limit.  He leaves with no questions is wearing his compressive wrist  strap but no more rigid orthosis and doing well with that.   Exercises - Forearm Supination Stretch  - 3-4 x daily - 3-5 reps - 15 sec hold - Wrist Flexion Stretch  - 4 x daily - 3-5 reps - 15 sec hold - Seated Wrist Extension Stretch  - 3-6 x daily - 3-5 reps - 15 hold - Full Fist  - 2-3 x daily - 5 reps - Finger Extension "Pizza!"   - 2-3 x daily - 5 reps - Thumb Opposition with Putty  - 2-3 x daily - 5 reps - Isometric Wrist Extension Pronated  - 2-3 x daily - 4-5 x weekly - 5 - 10 reps - 5-10 seconds hold - Seated Isometric Wrist Flexion Neutral  - 4-6 x daily - 1 sets - 10-15 reps - Isometric Wrist Extension Neutral  - 4-6 x daily - 1 sets - 10-15 reps - Seated Isometric Wrist Radial Deviation with Manual Resistance  - 4-6 x daily - 1 sets - 10-15 reps    PATIENT EDUCATION: Education details: See tx section above for details  Person educated: Patient Education method: Verbal Instruction, Teach back, Handouts  Education comprehension: States and demonstrates understanding, Additional Education required    HOME EXERCISE PROGRAM: Access Code: [email or web address] URL: [email or web address] Date: 01/15/2023 Prepared by: Fannie Knee   GOALS: Goals reviewed with patient? Yes   SHORT TERM GOALS: (STG required if POC>30 days) Target Date: 02/01/2023  Pt will obtain protective, custom orthotic. Goal status: 01/15/2023: MET   2.  Pt will demo/state understanding of initial HEP to improve pain levels and prerequisite motion. Goal status: 01/24/2023: Met   LONG TERM GOALS: Target Date: 03/01/2023  Pt will improve functional ability by decreased impairment per PRWE assessment from 55/10 to 20/100 or better, for better quality of life. Goal status: INITIAL  2.  Pt  will improve grip strength in left hand from not tested at eval to at least 45 lbs for functional use at home and in IADLs. Goal status: INITIAL  3.  Pt will improve A/ROM in left wrist flexion/extension from 24/40 to at least 55 degrees each, to have functional motion for tasks like reach and grasp.  Goal status: INITIAL  4.  Pt will improve strength in left wrist flexion/extension from not tested at evaluation to at least 4+/5 MMT to have increased functional ability to carry out selfcare and higher-level homecare tasks with less difficulty. Goal status: INITIAL  5.  Pt will improve coordination skills in left arm, as seen by within functional limit score on box and blocks testing to have increased functional ability to carry out fine motor tasks (fasteners, etc.) and more complex, coordinated IADLs (meal prep, sports, etc.).  Goal status: INITIAL   ASSESSMENT:  CLINICAL IMPRESSION: 02/19/2023:  ***  02/12/23: Motion and strength are up and now he is working on his forearm and wrist strengthening gently with isometrics.  Carry on an upgrade to whole arm strengthening next session likely discharging in 1-2 additional visits.    PLAN:  OT FREQUENCY: 1-2x/week  OT DURATION: 6 weeks through 03/01/2023 and up to 12 total visits as needed  PLANNED INTERVENTIONS: 97168 OT Re-evaluation, 97535 self care/ADL training, 16109 therapeutic exercise, 97530 therapeutic activity, 97112 neuromuscular re-education, 97140 manual therapy, 97039 fluidotherapy, 97010 moist heat, 97010 cryotherapy, 97034 contrast bath, 97760 Orthotics management and training, 60454 Splinting (initial encounter), [id number] Subsequent splinting/medication, scar mobilization, compression bandaging, Dry needling, energy conservation, coping strategies training, and patient/family education  RECOMMENDED OTHER  SERVICES: OT strongly recommends a referral to physical therapy for balance, gait, mobility and fall prevention.  OT will send a  note to his PCP about this.  CONSULTED AND AGREED WITH PLAN OF CARE: Patient  PLAN FOR NEXT SESSION:   ***  Fannie Knee, OTR/L, CHT 02/18/2023, 5:46 PM

## 2023-02-19 ENCOUNTER — Encounter: Payer: Self-pay | Admitting: Rehabilitative and Restorative Service Providers"

## 2023-02-19 ENCOUNTER — Ambulatory Visit (INDEPENDENT_AMBULATORY_CARE_PROVIDER_SITE_OTHER): Payer: Medicare Other | Admitting: Rehabilitative and Restorative Service Providers"

## 2023-02-19 DIAGNOSIS — R600 Localized edema: Secondary | ICD-10-CM | POA: Diagnosis not present

## 2023-02-19 DIAGNOSIS — M25532 Pain in left wrist: Secondary | ICD-10-CM | POA: Diagnosis not present

## 2023-02-19 DIAGNOSIS — M6281 Muscle weakness (generalized): Secondary | ICD-10-CM | POA: Diagnosis not present

## 2023-02-19 DIAGNOSIS — R278 Other lack of coordination: Secondary | ICD-10-CM

## 2023-02-19 DIAGNOSIS — M25632 Stiffness of left wrist, not elsewhere classified: Secondary | ICD-10-CM | POA: Diagnosis not present

## 2023-02-20 ENCOUNTER — Ambulatory Visit: Payer: Medicare Other | Admitting: Cardiovascular Disease

## 2023-02-22 DIAGNOSIS — N3941 Urge incontinence: Secondary | ICD-10-CM | POA: Diagnosis not present

## 2023-02-22 DIAGNOSIS — C61 Malignant neoplasm of prostate: Secondary | ICD-10-CM | POA: Diagnosis not present

## 2023-02-22 DIAGNOSIS — N99114 Postprocedural urethral stricture, male, unspecified: Secondary | ICD-10-CM | POA: Diagnosis not present

## 2023-02-22 DIAGNOSIS — N401 Enlarged prostate with lower urinary tract symptoms: Secondary | ICD-10-CM | POA: Diagnosis not present

## 2023-02-22 DIAGNOSIS — R351 Nocturia: Secondary | ICD-10-CM | POA: Diagnosis not present

## 2023-02-25 DIAGNOSIS — H353133 Nonexudative age-related macular degeneration, bilateral, advanced atrophic without subfoveal involvement: Secondary | ICD-10-CM | POA: Diagnosis not present

## 2023-02-25 DIAGNOSIS — H43813 Vitreous degeneration, bilateral: Secondary | ICD-10-CM | POA: Diagnosis not present

## 2023-02-25 DIAGNOSIS — H401132 Primary open-angle glaucoma, bilateral, moderate stage: Secondary | ICD-10-CM | POA: Diagnosis not present

## 2023-02-25 DIAGNOSIS — H353231 Exudative age-related macular degeneration, bilateral, with active choroidal neovascularization: Secondary | ICD-10-CM | POA: Diagnosis not present

## 2023-02-26 ENCOUNTER — Encounter: Payer: Medicare Other | Admitting: Rehabilitative and Restorative Service Providers"

## 2023-02-26 NOTE — Progress Notes (Signed)
 Patient is scheduled for Monday March 10

## 2023-03-06 DIAGNOSIS — M48061 Spinal stenosis, lumbar region without neurogenic claudication: Secondary | ICD-10-CM | POA: Diagnosis not present

## 2023-03-06 DIAGNOSIS — Z6838 Body mass index (BMI) 38.0-38.9, adult: Secondary | ICD-10-CM | POA: Diagnosis not present

## 2023-03-07 DIAGNOSIS — D509 Iron deficiency anemia, unspecified: Secondary | ICD-10-CM | POA: Diagnosis not present

## 2023-03-08 DIAGNOSIS — I700 Atherosclerosis of aorta: Secondary | ICD-10-CM | POA: Diagnosis not present

## 2023-03-08 DIAGNOSIS — I10 Essential (primary) hypertension: Secondary | ICD-10-CM | POA: Diagnosis not present

## 2023-03-08 DIAGNOSIS — J449 Chronic obstructive pulmonary disease, unspecified: Secondary | ICD-10-CM | POA: Diagnosis not present

## 2023-03-10 ENCOUNTER — Other Ambulatory Visit: Payer: Self-pay | Admitting: Cardiology

## 2023-03-11 ENCOUNTER — Ambulatory Visit (INDEPENDENT_AMBULATORY_CARE_PROVIDER_SITE_OTHER): Payer: Medicare Other | Admitting: Orthopedic Surgery

## 2023-03-11 ENCOUNTER — Other Ambulatory Visit (INDEPENDENT_AMBULATORY_CARE_PROVIDER_SITE_OTHER): Payer: Self-pay

## 2023-03-11 DIAGNOSIS — S52572A Other intraarticular fracture of lower end of left radius, initial encounter for closed fracture: Secondary | ICD-10-CM

## 2023-03-11 DIAGNOSIS — M25532 Pain in left wrist: Secondary | ICD-10-CM

## 2023-03-11 NOTE — Progress Notes (Signed)
   Cody Tate - 73 y.o. male MRN [id number]  Date of birth: 26-Feb-1950  Office Visit Note: Visit Date: 03/11/2023 PCP: Emilio Aspen, MD Referred by: Emilio Aspen, *  Subjective:  HPI: Cody Tate is a 73 y.o. male who presents today for follow up 10 weeks status post left wrist open reduction internal fixation.  His pain is controlled, he has done well with occupational therapy and has met his postoperative goals.  His range of motion is good.  He is back to utilizing his walker and weightbearing through his wrist without pain.  Pertinent ROS were reviewed with the patient and found to be negative unless otherwise specified above in HPI.   Assessment & Plan: Visit Diagnoses:  1. Other closed intra-articular fracture of distal end of left radius, initial encounter   2. Pain in left wrist     Plan: He is doing well postoperatively from a clinical standpoint.  Continue with activities as tolerated moving forward.  I did explain to him that there is an element of underlying arthritis at the wrist particular at the radiocarpal interval with some bony collapse which may lead to further pain down the road, at that juncture we could address this with arthritic salvage procedures as needed.  Given that his range of motion is excellent on today's examination without pain or crepitus, he should continue with activities as tolerated.  Follow-up: No follow-ups on file.   Meds & Orders: No orders of the defined types were placed in this encounter.   Orders Placed This Encounter  Procedures   XR Wrist Complete Left     Procedures: No procedures performed       Objective:   Vital Signs: There were no vitals taken for this visit.  Ortho Exam Left wrist: - Flexion/extension 65/55 without pain or crepitus - Digital range of motion is well-preserved, composite fist, thumb EPL/FPL fully intact without pain - Sensation is slightly diminished in the median nerve  distribution to light touch at baseline - No significant tenderness throughout the hand or forearm - Hand remains warm well-perfused  Imaging: XR Wrist Complete Left Result Date: 03/11/2023 X-rays of the left wrist demonstrate previously known distal radius fracture with intra-articular extension.  However remains well-fixed in all planes, lateral view demonstrates that the screws are subchondral in nature.  There is notable callus at the fracture site with appropriate interval healing.    Delanna Blacketer Trevor Mace, M.D. [facility], Hand Surgery

## 2023-03-12 DIAGNOSIS — H353114 Nonexudative age-related macular degeneration, right eye, advanced atrophic with subfoveal involvement: Secondary | ICD-10-CM | POA: Diagnosis not present

## 2023-03-13 ENCOUNTER — Ambulatory Visit: Attending: Internal Medicine | Admitting: Physical Therapy

## 2023-03-13 ENCOUNTER — Other Ambulatory Visit: Payer: Self-pay

## 2023-03-13 ENCOUNTER — Encounter: Payer: Self-pay | Admitting: Physical Therapy

## 2023-03-13 VITALS — BP 151/69 | HR 62

## 2023-03-13 DIAGNOSIS — R269 Unspecified abnormalities of gait and mobility: Secondary | ICD-10-CM | POA: Insufficient documentation

## 2023-03-13 DIAGNOSIS — M6281 Muscle weakness (generalized): Secondary | ICD-10-CM | POA: Insufficient documentation

## 2023-03-13 NOTE — Therapy (Signed)
 OUTPATIENT PHYSICAL THERAPY NEURO EVALUATION   [name] MRN: [id number] DOB:12-Oct-1950, 73 y.o., male Today's Date: 03/13/2023   PCP: Emilio Aspen, MD REFERRING PROVIDER: Emilio Aspen, MD  END OF SESSION:  PT End of Session - 03/13/23 1320     Visit Number 1    Number of Visits 4    Date for PT Re-Evaluation 04/24/23    Authorization Type Medicare    PT Start Time 1317    PT Stop Time 1405    PT Time Calculation (min) 48 min    Equipment Utilized During Treatment Gait belt    Activity Tolerance Patient tolerated treatment well    Behavior During Therapy WFL for tasks assessed/performed             Past Medical History:  Diagnosis Date   Arthritis    Back pain    Radiates down both legs   BPH (benign prostatic hyperplasia)    Cancer (HCC) 2022   prostate cancer   COPD (chronic obstructive pulmonary disease) (HCC)    Dyspnea    Enlarged prostate    GERD (gastroesophageal reflux disease)    Glaucoma    H/O hiatal hernia    H/O wheezing    occ inhaler usage   Heart murmur    Hypertension    Lumbar foraminal stenosis    Macular degeneration    Meningioma (HCC) 2014   Treated with radiation   Neuromuscular disorder (HCC)    Neuropathy bilateral legs   PONV (postoperative nausea and vomiting)    Pulmonary nodules    resolved on 2019 follow up CT   S/P insertion of spinal cord stimulator    Past Surgical History:  Procedure Laterality Date   ABDOMINAL EXPOSURE N/A 03/16/2022   Procedure: ABDOMINAL EXPOSURE;  Surgeon: Cephus Shelling, MD;  Location: [facility] OR;  Service: Vascular;  Laterality: N/A;   ANTERIOR LUMBAR FUSION N/A 03/16/2022   Procedure: Anterior Lumbar Interbody Fusion - Lumbar Five-Sacral One with cage;  Surgeon: Julio Sicks, MD;  Location: [facility] OR;  Service: Neurosurgery;  Laterality: N/A;   BACK SURGERY  06/08/2006   lam x3 cerv x 1   CERVICAL FUSION     COLONOSCOPY  2020   CYSTOSCOPY     73 yrs old    CYSTOSCOPY WITH URETHRAL DILATATION N/A 09/21/2021   Procedure: CYSTOSCOPY WITH BALLOON DILATION and OPTILUME BALLOON DILATION OF  URETHRAL DILATATION;  Surgeon: Noel Christmas, MD;  Location: [facility] ORS;  Service: Urology;  Laterality: N/A;   EYE SURGERY     bil   GOLD SEED IMPLANT N/A 09/06/2020   Procedure: GOLD SEED IMPLANT/ PLACEMENT OF FIDUCIAL MARKERS;  Surgeon: Noel Christmas, MD;  Location: [facility] ORS;  Service: Urology;  Laterality: N/A;   KNEE ARTHROSCOPY  01/02/2008   lft   KNEE ARTHROSCOPY Left 07/23/2012   Procedure: LEFT MEDIAL MENISCAL DEBRIDEMENT AND CHONDROPLASTY;  Surgeon: Loanne Drilling, MD;  Location: [facility] ORS;  Service: Orthopedics;  Laterality: Left;   LUMBAR FUSION  02/22/2014   DR POOL   PILONIDAL CYST EXCISION     SINUS EXPLORATION     SPACE OAR INSTILLATION N/A 09/06/2020   Procedure: SPACE OAR INSTILLATION;  Surgeon: Noel Christmas, MD;  Location: [facility] OR[facility];  Service: Urology;  Laterality: N/A;   SPINAL CORD STIMULATOR INSERTION N/A 01/16/2018   Procedure: LUMBAR SPINAL CORD STIMULATOR INSERTION;  Surgeon: Tia Alert, MD;  Location: [facility] OR;  Service: Neurosurgery;  Laterality:  N/A;  LUMBAR SPINAL CORD STIMULATOR INSERTION   TRANSURETHRAL RESECTION OF PROSTATE N/A 09/06/2020   Procedure: TRANSURETHRAL RESECTION OF THE PROSTATE (TURP);  Surgeon: Noel Christmas, MD;  Location: [facility] ORS;  Service: Urology;  Laterality: N/A;  2 HRS   WRIST GANGLION EXCISION     rt   WRIST SURGERY  01/02/2007   cyst lft   Patient Active Problem List   Diagnosis Date Noted   Acute heart failure with preserved ejection fraction (HCC) 11/12/2022   Mild mitral regurgitation 10/30/2022   Aortic valve stenosis 10/30/2022   COPD (chronic obstructive pulmonary disease) (HCC) 01/11/2022   GERD (gastroesophageal reflux disease) 01/11/2022   Hypercholesterolemia 01/11/2022   Hypertension 01/11/2022   Lumbago with sciatica, left side 01/11/2022   Lumbago with sciatica, right side  01/11/2022   Obesity (BMI 30.0-34.9) 01/11/2022   Other chronic pain 01/11/2022   Pulmonary nodule 01/11/2022   Lumbar foraminal stenosis 01/11/2022   Aortic ectasia (HCC) 01/11/2022   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   Malignant neoplasm of prostate (HCC) 07/12/2020   S/P insertion of spinal cord stimulator 01/16/2018   Back pain with history of spinal surgery 09/09/2017   Inflammation of sacroiliac joint (HCC) 09/20/2016   Opioid dependence (HCC) 01/10/2016   Swelling of lower limb 03/25/2014   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   Complains of low back pain 09/09/2013   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   Lumbosacral radiculitis 12/03/2012   Spinal stenosis of lumbar region 10/30/2012   Meningioma (HCC) 10/28/2012   Lumbosacral spondylosis without myelopathy 10/23/2012   Acute medial meniscal tear 07/22/2012   Dizziness 06/25/2012   Backache 06/25/2012   Benign neoplasm of cerebral meninges (HCC) 06/25/2012   Neck pain 06/25/2012   Lumbar stenosis with neurogenic claudication 05/08/2011    ONSET DATE: 01/28/2023 (referral date)  REFERRING DIAG: R29.6 (ICD-10-CM) - Falls frequently R26.81 (ICD-10-CM) - Unsteady gait  THERAPY DIAG:  Muscle weakness (generalized) - Plan: PT plan of care cert/re-cert  Abnormality of gait and mobility - Plan: PT plan of care cert/re-cert  Rationale for Evaluation and Treatment: Rehabilitation  SUBJECTIVE:                                                                                                                                                                                             SUBJECTIVE STATEMENT: Patient reports that he was referred for physical therapy by occupational therapist due to numerous falls. Patient reports here due to suggestion. Reporting over 5-6 falls in recent years. Patient with extensive lumbar history including multiple fusions. Reports that he typically fall  because his  left leg gives out on him and he has very bad balance. Patient reports that he did physical therapy a few years ago. Patient reports that when he has his falls he is typically not holding onto anything but furniture.   Pt accompanied by: self and wife June  PERTINENT HISTORY: extensive lumbar history with numerous surgeries totaling around 6 (laminectomy, multiple lumbar fusions most recent was last year), meningoma, spinal stenosis with neurogrenic claudication, HF, COPD   PAIN:  Are you having pain? Yes: NPRS scale: 3/10 Pain location: around hips Pain description: achy Aggravating factors: standing for long periods of time Relieving factors: laying on down in fetal postion  PRECAUTIONS: Fall  RED FLAGS: Incontinence following prostate cancer and radiation treatment    WEIGHT BEARING RESTRICTIONS: No (wrist no longer has restrictions from fx)  FALLS: Has patient fallen in last 6 months? Yes. Number of falls reports 5-6 falls in the last 6 months, reports being able to get up by self  when grabs onto something, one wrist fracture that has since healed   LIVING ENVIRONMENT: Lives with: lives with their spouse Lives in: House/apartment Stairs: Yes: External: 4-5 stairs, 3 in back steps; can reach both in front, and on the R on the way up in back plus grab bars  Has following equipment at home:  canes, scooter, lift chair, canes, bariatric rollator  PLOF:  Modified independence with some help for dressing and does not drive due to macular degeneration, needs help getting compression socks on  PATIENT GOALS: "Trying to regain some balance."  OBJECTIVE:  Note: Objective measures were completed at Evaluation unless otherwise noted.  DIAGNOSTIC FINDINGS:   09/14/2022 CT Head: IMPRESSION: No change in a 15 mm meningioma projecting infromedial from the right tentorium. No new abnormality  COGNITION: Overall cognitive status: Impaired - reports some short term memory  concerns   Reports not wanting to trial memory impairments    SENSATION: Reports numbness and tingling in bilateral legs  COORDINATION: WFL  EDEMA:  1+ edema in bilateral hitting  LOWER EXTREMITY ROM:     Mild reduction in DF and hip flexion  LOWER EXTREMITY MMT:    MMT Right Eval Left Eval  Hip flexion 4-/5 4-/5  Hip extension    Hip abduction    Hip adduction    Hip internal rotation    Hip external rotation    Knee flexion 4-/5 4-/5  Knee extension 4-/5 4-/5  Ankle dorsiflexion 4-/5 4-/5  Ankle plantarflexion    Ankle inversion    Ankle eversion    (Blank rows = not tested)  BED MOBILITY:  Reports independence getting in and out of bed by slef  TRANSFERS: Assistive device utilized:  Bariatric rollator   Sit to stand: SBA Stand to sit: SBA Chair to chair: SBA  GAIT: Gait pattern: decreased hip/knee flexion- Right, decreased hip/knee flexion- Left, decreased ankle dorsiflexion- Right, decreased ankle dorsiflexion- Left, and lateral hip instability, mild hyperextension of L knee Distance walked: various clinic distances Assistive device utilized:  Metallurgist Level of assistance: SBA Comments: keeps rollator far out from body due to flexed posture  FUNCTIONAL TESTS:    [facility] PT Assessment - 03/13/23 0001       Standardized Balance Assessment   Standardized Balance Assessment Five Times Sit to Stand    Five times sit to stand comments  11   seconds with strong reliance on UE (SBA)           Vitals:  03/13/23 1339 03/13/23 1343  BP: (!) 161/69 (!) 151/69  Pulse: (!) 57 62                                                                                                    TREATMENT:    Self Care: Educated on meaning of neurogenic claudication and importance of use of rollator at this time to decrease falls risk as patient falling when furniture surfing, discussed power wheelchair and that PT unsure if would recommend at this time and would  advise further evaluation before pursuing farther, educated on BP safety and encouraged monitoring at home (patient has cuff at home but is not yet using)  PATIENT EDUCATION: Education details: POC, goal collaboration, examination findings Person educated: Patient and Caregiver wife Education method: Explanation Education comprehension: verbalized understanding and needs further education  HOME EXERCISE PROGRAM: To be provided   GOALS: Goals reviewed with patient? Yes  LONG TERM GOALS: Target date: 04/24/2023  Patient will report demonstrate independence with final HEP in order to maintain current gains and continue to progress after physical therapy discharge.   Baseline: To be provided Goal status: INITIAL  2.  Patient will demonstrate ability to peform 5xSTS without UE to demonstrated improved planning with transfers. Baseline: strong reliance on UE Goal status: INITIAL  3.  Assess / and write LTG Baseline: To be assessed  Goal status: INITIAL  4.  Assess TUG / and write LTG Baseline: To be assessed  Goal status: INITIAL  5.  Assess / write LTG Baseline: To be assessed  Goal status: INITIAL  ASSESSMENT:  CLINICAL IMPRESSION: Patient is a 73 y.o. male who was seen today for physical therapy evaluation and treatment for frequent falls with PMH including HF, COPD, numerous lumbar surgeries, spinal stenosis with neurogenic claudication. Patient presents with decreased balance, need for AD, decreased strength and ROM. Patient demonstrates strong reliance on UE to come to stand with transfers. Reports increased LE weakness with longer he walks though strength deficits though present are not profound but appear to be more functionally limited. Advise further testing next session to determine most appropriate AD for patient at this time and plan to reduce risk for falls.    OBJECTIVE IMPAIRMENTS: Abnormal gait, decreased balance, decreased cognition, decreased ROM,  decreased strength, decreased safety awareness, increased edema, impaired sensation, impaired vision/preception, and pain.   ACTIVITY LIMITATIONS: carrying, standing, squatting, transfers, dressing, and reach over head  PARTICIPATION LIMITATIONS: meal prep, cleaning, laundry, community activity, and yard work  PERSONAL FACTORS: Age, Time since onset of injury/illness/exacerbation, and 3+ comorbidities: see above  are also affecting patient's functional outcome.   REHAB POTENTIAL: Fair may be limited by chronicity of deficits and comboridites  CLINICAL DECISION MAKING: Stable/uncomplicated  EVALUATION COMPLEXITY: Low  PLAN:  PT FREQUENCY: 1x/week  PT DURATION: 4 weeks  PLANNED INTERVENTIONS: 97164- PT Re-evaluation, 97110-Therapeutic exercises, 97530- Therapeutic activity, [id number]- Neuromuscular re-education, 97535- Self Care, 16109- Manual therapy, and 97116- Gait training  PLAN FOR NEXT SESSION: assess TUG and , AD recommendations as indicated, basic balance and strength HEP, continue power  wheelchair conversation as indicated or education on pacing and activity modification, moni   Carmelia Bake, PT, DPT 03/13/2023, 2:23 PM

## 2023-03-20 ENCOUNTER — Encounter: Payer: Self-pay | Admitting: Physical Therapy

## 2023-03-20 ENCOUNTER — Ambulatory Visit: Admitting: Physical Therapy

## 2023-03-20 VITALS — BP 148/67 | HR 62

## 2023-03-20 DIAGNOSIS — R269 Unspecified abnormalities of gait and mobility: Secondary | ICD-10-CM | POA: Diagnosis not present

## 2023-03-20 DIAGNOSIS — M6281 Muscle weakness (generalized): Secondary | ICD-10-CM

## 2023-03-20 NOTE — Therapy (Signed)
 OUTPATIENT PHYSICAL THERAPY NEURO TREATMENT   [name] MRN: [id number] DOB:May 10, 1950, 73 y.o., male Today's Date: 03/20/2023   PCP: Emilio Aspen, MD REFERRING PROVIDER: Emilio Aspen, MD  END OF SESSION:  PT End of Session - 03/20/23 1329     Visit Number 2    Number of Visits 4    Date for PT Re-Evaluation 04/24/23    Authorization Type Medicare    PT Start Time 1327    PT Stop Time 1403   shortened due to late arrival to session   PT Time Calculation (min) 36 min    Equipment Utilized During Treatment Gait belt    Activity Tolerance Patient tolerated treatment well    Behavior During Therapy WFL for tasks assessed/performed             Past Medical History:  Diagnosis Date   Arthritis    Back pain    Radiates down both legs   BPH (benign prostatic hyperplasia)    Cancer (HCC) 2022   prostate cancer   COPD (chronic obstructive pulmonary disease) (HCC)    Dyspnea    Enlarged prostate    GERD (gastroesophageal reflux disease)    Glaucoma    H/O hiatal hernia    H/O wheezing    occ inhaler usage   Heart murmur    Hypertension    Lumbar foraminal stenosis    Macular degeneration    Meningioma (HCC) 2014   Treated with radiation   Neuromuscular disorder (HCC)    Neuropathy bilateral legs   PONV (postoperative nausea and vomiting)    Pulmonary nodules    resolved on 2019 follow up CT   S/P insertion of spinal cord stimulator    Past Surgical History:  Procedure Laterality Date   ABDOMINAL EXPOSURE N/A 03/16/2022   Procedure: ABDOMINAL EXPOSURE;  Surgeon: Cephus Shelling, MD;  Location: [facility] OR;  Service: Vascular;  Laterality: N/A;   ANTERIOR LUMBAR FUSION N/A 03/16/2022   Procedure: Anterior Lumbar Interbody Fusion - Lumbar Five-Sacral One with cage;  Surgeon: Julio Sicks, MD;  Location: [facility] OR;  Service: Neurosurgery;  Laterality: N/A;   BACK SURGERY  06/08/2006   lam x3 cerv x 1   CERVICAL FUSION      COLONOSCOPY  2020   CYSTOSCOPY     73 yrs old   CYSTOSCOPY WITH URETHRAL DILATATION N/A 09/21/2021   Procedure: CYSTOSCOPY WITH BALLOON DILATION and OPTILUME BALLOON DILATION OF  URETHRAL DILATATION;  Surgeon: Noel Christmas, MD;  Location: [facility] ORS;  Service: Urology;  Laterality: N/A;   EYE SURGERY     bil   GOLD SEED IMPLANT N/A 09/06/2020   Procedure: GOLD SEED IMPLANT/ PLACEMENT OF FIDUCIAL MARKERS;  Surgeon: Noel Christmas, MD;  Location: [facility] OR[facility];  Service: Urology;  Laterality: N/A;   KNEE ARTHROSCOPY  01/02/2008   lft   KNEE ARTHROSCOPY Left 07/23/2012   Procedure: LEFT MEDIAL MENISCAL DEBRIDEMENT AND CHONDROPLASTY;  Surgeon: Loanne Drilling, MD;  Location: [facility] OR[facility];  Service: Orthopedics;  Laterality: Left;   LUMBAR FUSION  02/22/2014   DR POOL   PILONIDAL CYST EXCISION     SINUS EXPLORATION     SPACE OAR INSTILLATION N/A 09/06/2020   Procedure: SPACE OAR INSTILLATION;  Surgeon: Noel Christmas, MD;  Location: [facility];  Service: Urology;  Laterality: N/A;   SPINAL CORD STIMULATOR INSERTION N/A 01/16/2018   Procedure: LUMBAR SPINAL CORD STIMULATOR INSERTION;  Surgeon: Tia Alert, MD;  Location: [facility] OR;  Service: Neurosurgery;  Laterality: N/A;  LUMBAR SPINAL CORD STIMULATOR INSERTION   TRANSURETHRAL RESECTION OF PROSTATE N/A 09/06/2020   Procedure: TRANSURETHRAL RESECTION OF THE PROSTATE (TURP);  Surgeon: Noel Christmas, MD;  Location: [facility];  Service: Urology;  Laterality: N/A;  2 HRS   WRIST GANGLION EXCISION     rt   WRIST SURGERY  01/02/2007   cyst lft   Patient Active Problem List   Diagnosis Date Noted   Acute heart failure with preserved ejection fraction (HCC) 11/12/2022   Mild mitral regurgitation 10/30/2022   Aortic valve stenosis 10/30/2022   COPD (chronic obstructive pulmonary disease) (HCC) 01/11/2022   GERD (gastroesophageal reflux disease) 01/11/2022   Hypercholesterolemia 01/11/2022   Hypertension 01/11/2022   Lumbago with sciatica, left side  01/11/2022   Lumbago with sciatica, right side 01/11/2022   Obesity (BMI 30.0-34.9) 01/11/2022   Other chronic pain 01/11/2022   Pulmonary nodule 01/11/2022   Lumbar foraminal stenosis 01/11/2022   Aortic ectasia (HCC) 01/11/2022   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   Malignant neoplasm of prostate (HCC) 07/12/2020   S/P insertion of spinal cord stimulator 01/16/2018   Back pain with history of spinal surgery 09/09/2017   Inflammation of sacroiliac joint (HCC) 09/20/2016   Opioid dependence (HCC) 01/10/2016   Swelling of lower limb 03/25/2014   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   Complains of low back pain 09/09/2013   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   Lumbosacral radiculitis 12/03/2012   Spinal stenosis of lumbar region 10/30/2012   Meningioma (HCC) 10/28/2012   Lumbosacral spondylosis without myelopathy 10/23/2012   Acute medial meniscal tear 07/22/2012   Dizziness 06/25/2012   Backache 06/25/2012   Benign neoplasm of cerebral meninges (HCC) 06/25/2012   Neck pain 06/25/2012   Lumbar stenosis with neurogenic claudication 05/08/2011    ONSET DATE: 01/28/2023 (referral date)  REFERRING DIAG: R29.6 (ICD-10-CM) - Falls frequently R26.81 (ICD-10-CM) - Unsteady gait  THERAPY DIAG:  Muscle weakness (generalized)  Abnormality of gait and mobility  Rationale for Evaluation and Treatment: Rehabilitation  SUBJECTIVE:                                                                                                                                                                                             SUBJECTIVE STATEMENT: Patient and spouse running late to session due to time it took to get out door; verbalize understanding of arrival time to future sessions. Patient denies falls since last here and says he has been using rollator more regularly at home; he reports he cannot get rollator into his bathroom but  has put some grab  bars up. He does have a scooter for community. He reports that is not interested in moving at this point and does occassionally use cane in community where it is hard to take rollator but feels very unsafe when doing so. Wife expresses concern about patient performing yard work by self in yard.   Pt accompanied by: self and wife June  PERTINENT HISTORY: extensive lumbar history with numerous surgeries totaling around 6 (laminectomy, multiple lumbar fusions most recent was last year), meningoma, spinal stenosis with neurogrenic claudication, HF, COPD   PAIN:  Are you having pain? Yes: NPRS scale: 3/10 Pain location: around hips Pain description: achy Aggravating factors: standing for long periods of time Relieving factors: laying on down in fetal postion  PRECAUTIONS: Fall  RED FLAGS: Incontinence following prostate cancer and radiation treatment    WEIGHT BEARING RESTRICTIONS: No (wrist no longer has restrictions from fx)  FALLS: Has patient fallen in last 6 months? Yes. Number of falls reports 5-6 falls in the last 6 months, reports being able to get up by self  when grabs onto something, one wrist fracture that has since healed   LIVING ENVIRONMENT: Lives with: lives with their spouse Lives in: House/apartment Stairs: Yes: External: 4-5 stairs, 3 in back steps; can reach both in front, and on the R on the way up in back plus grab bars  Has following equipment at home:  canes, scooter, lift chair, canes, bariatric rollator  PLOF:  Modified independence with some help for dressing and does not drive due to macular degeneration, needs help getting compression socks on  PATIENT GOALS: "Trying to regain some balance."  OBJECTIVE:  Note: Objective measures were completed at Evaluation unless otherwise noted.  DIAGNOSTIC FINDINGS:   09/14/2022 CT Head: IMPRESSION: No change in a 15 mm meningioma projecting infromedial from the right tentorium. No new  abnormality  COGNITION: Overall cognitive status: Impaired - reports some short term memory concerns   Reports not wanting to trial memory impairments                                                                                         TREATMENT:    Self Care:  Vitals:   03/20/23 1343  BP: (!) 148/67  Pulse: 62   Vitals assessed as note above, elevated but WFL for therapy Discuss options to improve safety in home as patient not interested in moving at this time  Discussed PWC - explained that does not appear indicated at this time given current level of mobility within the home level, discussed when/if future it may be indicated and briefly discussed steps for process Discussed fall recovery button - strongly recommended for spouse piece of mind with patient, provided printout of where resources can be given Discussed grab bars and floor to ceiling poles that can be put in for getting up out of bed or in bathroom where a wall is not around, patient already put in numerous grab bars but does report some locations where he is not able to, provided printout Discussed moving versus bedside commode versus contractor looking at property to widen door to  accommodate bariatric rollator into bathroom, patient and spouse seemed to think that widening doorway may be possible in future  PT explained would not advise any use of cane at this time given degree of gait deficits Discussed patient's bad knee and whether or not surgery was indicated, PT deferred to medical team    TherAct:   [facility] PT Assessment - 03/20/23 0001       Standardized Balance Assessment   Standardized Balance Assessment Timed Up and Go Test;10 meter walk test    10 Meter Walk 0.39 m/s   with bariatric rollator (SBA)     Timed Up and Go Test   Normal TUG (seconds) 24.36   seconds with bariatric rollator            Discussed patient progress on goals and falls risk and how indicates need for fall safety as educated  on above/below Reviewed safety with rollator and adjusted height of rollator by dropping handle bars to accommodate appropriate posturing and reduce risk of UE injury, educated on body positioning in relation to rollator and explained given complex spinal history flexion would likely be most comfortable; however, encouraged patient to still work on proximity to rollator with frequent flexion stretch breaks to relieve symptoms as needed   PATIENT EDUCATION: Education details: See self care section above Person educated: Patient and Caregiver wife Education method: Explanation Education comprehension: verbalized understanding and needs further education  HOME EXERCISE PROGRAM: To be provided   GOALS: Goals reviewed with patient? Yes  LONG TERM GOALS: Target date: 04/24/2023  Patient will report demonstrate independence with final HEP in order to maintain current gains and continue to progress after physical therapy discharge.   Baseline: To be provided Goal status: INITIAL  2.  Patient will demonstrate ability to peform 5xSTS without UE to demonstrated improved planning with transfers. Baseline: strong reliance on UE Goal status: INITIAL  3.  Patient will improve gait speed to 0.48 m/s or greater with LRAD to indicate an improvement from being a household ambulator to limited community ambulator.   Baseline: 0.39 m/s with bariatric rollator (SBA)  Goal status: INITIAL  4.  Patient will improve TUG score to 20 seconds or less with LRAD to indicate a decreased risk of falls and demonstrate improved overall mobility.   Baseline: 24.36 seconds with bariatric rollator  Goal status: INITIAL  5.  Assess / write LTG Baseline: To be assessed  Goal status: D/C due to POC length   ASSESSMENT:  CLINICAL IMPRESSION: Skilled physical therapy session emphasized extensive education on fall safety for patient including fall recovery buttons, equipment recommendations, and safety with current  AD. Patient is aware of falls risk but does continue to take risks outside of therapy sessions in order to perform tasks he enjoys so PT brainstormed ideas and ways patient can modify tasks for safety. Patient is at a falls risk as indicated by TUG/gait speed and limited to household ambulation as indicated by gait speed. Patient will benefit from skilled physical therapy services to address current deficits and progress towards LTGs.   OBJECTIVE IMPAIRMENTS: Abnormal gait, decreased balance, decreased cognition, decreased ROM, decreased strength, decreased safety awareness, increased edema, impaired sensation, impaired vision/preception, and pain.   ACTIVITY LIMITATIONS: carrying, standing, squatting, transfers, dressing, and reach over head  PARTICIPATION LIMITATIONS: meal prep, cleaning, laundry, community activity, and yard work  PERSONAL FACTORS: Age, Time since onset of injury/illness/exacerbation, and 3+ comorbidities: see above  are also affecting patient's functional outcome.   REHAB  POTENTIAL: Fair may be limited by chronicity of deficits and comboridites  CLINICAL DECISION MAKING: Stable/uncomplicated  EVALUATION COMPLEXITY: Low  PLAN:  PT FREQUENCY: 1x/week  PT DURATION: 4 weeks  PLANNED INTERVENTIONS: 97164- PT Re-evaluation, 97110-Therapeutic exercises, 97530- Therapeutic activity, [id number]- Neuromuscular re-education, 97535- Self Care, [id number]- Manual therapy, and 97116- Gait training  PLAN FOR NEXT SESSION: basic balance and strength HEP and any additional recommendations for home safety, did patient follow up about widening bathroom door or fall button etc  Carmelia Bake, PT, DPT 03/20/2023, 3:20 PM

## 2023-03-21 ENCOUNTER — Encounter: Payer: Self-pay | Admitting: Cardiology

## 2023-03-21 ENCOUNTER — Ambulatory Visit: Attending: Cardiology | Admitting: Cardiology

## 2023-03-21 VITALS — BP 154/72 | HR 63 | Ht 66.5 in | Wt 235.6 lb

## 2023-03-21 DIAGNOSIS — I159 Secondary hypertension, unspecified: Secondary | ICD-10-CM | POA: Diagnosis not present

## 2023-03-21 DIAGNOSIS — I5031 Acute diastolic (congestive) heart failure: Secondary | ICD-10-CM | POA: Diagnosis not present

## 2023-03-21 DIAGNOSIS — E7800 Pure hypercholesterolemia, unspecified: Secondary | ICD-10-CM | POA: Insufficient documentation

## 2023-03-21 MED ORDER — ASPIRIN 81 MG PO TBEC: 81.0000 mg | DELAYED_RELEASE_TABLET | Freq: Every day | ORAL | Status: DC

## 2023-03-21 NOTE — Progress Notes (Signed)
 Cardiology Office Note:   Date:  03/21/2023  ID:  Cody Tate, DOB 1950/09/13, MRN [id number] PCP: Emilio Aspen, MD  [facility] Providers Cardiologist:  Charlton Haws, MD    History of Present Illness:   Pertinent medical history: - Hypertension - COPD stage II - Hyperlipidemia - Chronic lower extremity edema, left greater than right - Myocardial infarction - Chronic back pain - Prostate cancer - Macular degeneration - Recent respiratory infection - Emphysema - Cirrhosis - Sleep apnea  Discussed the use of AI scribe software for clinical note transcription with the patient, who gave verbal consent to proceed.  Cody Tate is a 73 year old male with hypertension and COPD who presents with worsening shortness of breath and edema. He is accompanied by his wife.   In October 2024, he reported weight gain and worsening edema, suggesting fluid retention. An echocardiogram at that time revealed normal ejection fraction and grade one diastolic dysfunction. Because his BNP was minimally elevated, diuretic increased. Following this adjustment, he noted improvement in fluid levels, weight reduction, and lessened severity of edema. At subsequent follow up, we added Jardiance as well as Spironolactone but ultimately had to discontinue Spironolactone due to hyperkalemia and AKI. Patient was given a diuretic break and put back on lower 20mg  dose of Lasix. We also decreases his Telmisartan to 40mg  once daily but per spouse, patient has gone back to twice daily.   Today he continues with chronic lower extremity edema, left greater than right. He uses compression socks for edema management. He uses two pillows at night to aid breathing, up from one previously.  He continues to experience shortness of breath, particularly in the mornings, which improves by the afternoon. Physical activity exacerbates the dyspnea. He uses a walker due to leg weakness and walks slowly. These  symptoms have been present for some time, with no significant changes recently. Since our last visit, has seen pulmonology with significant workup. Extensive testing, including an HRCT scan and PET scan, was performed. The PET scan showed residual post-infectious versus inflammatory scarring in the right upper lobe of the lung, attributed to a recent respiratory infection. The HRCT scan showed emphysema and consolidation in the right upper lobe with improvement, as well as coronary artery calcification and irregular liver margin suggesting possible cirrhosis. An overnight pulse oximetry study indicated nocturnal oxygen requirement, and a sleep study was arranged due to nocturnal desaturation. Patient also had inflammatory labs checked with follow up scheduled next month with pulmonology. Regarding pulmonary medications, he previously trialed Markus Daft but returned to Trelegy due to preference and ease of use. Current inhaler regimen includes Trelegy, which he prefers over [place].  He still reports dizziness upon standing, which has improved slightly. He is not currently tracking his blood pressure at home. Blood pressure was elevated at 154/72 and 152/74 during recent measurements. Patient and spouse try to follow a low salt diet but admit to some recent takeout meals due to being busy with family funeral recently.      Studies Reviewed:    11/07/22 TTE IMPRESSIONS     1. Left ventricular ejection fraction, by estimation, is 70 to 75%. The  left ventricle has hyperdynamic function. The left ventricle has no  regional wall motion abnormalities. There is mild left ventricular  hypertrophy. Left ventricular diastolic  parameters are consistent with Grade I diastolic dysfunction (impaired  relaxation). The average left ventricular global longitudinal strain is  -17.8 %. The global longitudinal strain is normal.  2. Right ventricular systolic function is normal. The right ventricular  size is normal.    3. The mitral valve is normal in structure. No evidence of mitral valve  regurgitation. No evidence of mitral stenosis.   4. The aortic valve is tricuspid. There is moderate calcification of the  aortic valve. There is mild thickening of the aortic valve. Aortic valve  regurgitation is not visualized. Aortic valve sclerosis is present, with  no evidence of aortic valve  stenosis. Aortic valve mean gradient measures 7.0 mmHg. Aortic valve Vmax  measures 1.87 m/s.   5. The inferior vena cava is normal in size with greater than 50%  respiratory variability, suggesting right atrial pressure of 3 mmHg.   FINDINGS   Left Ventricle: Left ventricular ejection fraction, by estimation, is 70  to 75%. The left ventricle has hyperdynamic function. The left ventricle  has no regional wall motion abnormalities. The average left ventricular  global longitudinal strain is -17.8   %. The global longitudinal strain is normal. The left ventricular  internal cavity size was normal in size. There is mild left ventricular  hypertrophy. Left ventricular diastolic parameters are consistent with  Grade I diastolic dysfunction (impaired  relaxation).   Right Ventricle: The right ventricular size is normal. No increase in  right ventricular wall thickness. Right ventricular systolic function is  normal.   Left Atrium: Left atrial size was normal in size.   Right Atrium: Right atrial size was normal in size.   Pericardium: There is no evidence of pericardial effusion.   Mitral Valve: The mitral valve is normal in structure. No evidence of  mitral valve regurgitation. No evidence of mitral valve stenosis.   Tricuspid Valve: The tricuspid valve is normal in structure. Tricuspid  valve regurgitation is not demonstrated. No evidence of tricuspid  stenosis.   Aortic Valve: The aortic valve is tricuspid. There is moderate  calcification of the aortic valve. There is mild thickening of the aortic  valve.  Aortic valve regurgitation is not visualized. Aortic valve  sclerosis is present, with no evidence of aortic  valve stenosis. Aortic valve mean gradient measures 7.0 mmHg. Aortic valve  peak gradient measures 14.0 mmHg. Aortic valve area, by VTI measures 2.99  cm.   Pulmonic Valve: The pulmonic valve was normal in structure. Pulmonic valve  regurgitation is not visualized. No evidence of pulmonic stenosis.   Aorta: The aortic root is normal in size and structure.   Venous: The inferior vena cava is normal in size with greater than 50%  respiratory variability, suggesting right atrial pressure of 3 mmHg.   IAS/Shunts: No atrial level shunt detected by color flow Doppler.    Risk Assessment/Calculations:     HYPERTENSION CONTROL Vitals:   03/21/23 1104  BP: (!) 154/72    The patient's blood pressure is elevated above target today.  In order to address the patient's elevated BP: Blood pressure will be monitored at home to determine if medication changes need to be made.; A referral to the [facility] will be placed.           Physical Exam:   VS:  BP (!) 154/72   Pulse 63   Ht 5' 6.5" (1.689 m)   Wt 235 lb 9.6 oz (106.9 kg)   SpO2 97%   BMI 37.46 kg/m    Wt Readings from Last 3 Encounters:  03/21/23 235 lb 9.6 oz (106.9 kg)  01/16/23 235 lb 6.4 oz (106.8 kg)  11/15/22 227  lb (103 kg)     Physical Exam Vitals reviewed.  Constitutional:      Appearance: Normal appearance.  HENT:     Head: Normocephalic.  Eyes:     Pupils: Pupils are equal, round, and reactive to light.  Cardiovascular:     Rate and Rhythm: Normal rate and regular rhythm.     Pulses: Normal pulses.     Heart sounds: Murmur heard.  Pulmonary:     Effort: Pulmonary effort is normal.     Breath sounds: Normal breath sounds.  Abdominal:     General: Abdomen is flat.     Palpations: Abdomen is soft.  Musculoskeletal:     Right lower leg: Edema (1+) present.     Left lower leg:  Edema (1+) present.  Skin:    General: Skin is warm and dry.     Capillary Refill: Capillary refill takes less than 2 seconds.  Neurological:     General: No focal deficit present.     Mental Status: He is alert and oriented to person, place, and time.  Psychiatric:        Mood and Affect: Mood normal.        Behavior: Behavior normal.        Thought Content: Thought content normal.        Judgment: Judgment normal.    ASSESSMENT AND PLAN:     Assessment and Plan    Shortness of breath Persistent dyspnea, particularly in the mornings. Pulmonology evaluation revealed residual post-infectious versus inflammatory scarring in the right upper lobe and emphysema. HRCT scan showed improvement in consolidation. Nocturnal oxygen study indicated borderline need for oxygen, initially declined but now interested. Discussed that untreated sleep apnea could exacerbate cardiac issues. Oxygen therapy at night is recommended to alleviate symptoms. CPAP may be necessary if sleep apnea is confirmed. 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 Current inhaler regimen includes Trelegy. Breztri trialed but not preferred due to dosing frequency and lack of perceived benefit. Trelegy is preferred by patient due to ease of use and effectiveness. - Continue Trelegy inhaler - Monitor for changes in respiratory status and adjust treatment as needed  Chronic lower extremity edema Chronic lower extremity edema, left greater than right, with fluid retention. Recent chocardiogram showed normal ejection fraction and grade one diastolic dysfunction. BNP marginally elevated at 119. Imaging suggests possible liver cirrhosis contributing to edema. Obesity is also a contributing factor. Less likely directly  attributable to heart failure, suspect a degree of venous insufficiency - Continue Lasix 20 mg daily - Monitor weight and fluid levels - Consider liver cirrhosis as a contributing factor - Check BNP, BMET  Diastolic dysfunction Grade one diastolic dysfunction noted on echocardiogram. Heart function otherwise normal with hyperdynamic LV. Discussed that jardiance is beneficial for managing diastolic dysfunction. - Continue Jardiance - continue Lasix 20mg  - Will avoid MRA given AKI and hyperkalemia with previous trial - Check renal function and BNP  Hypertension Blood pressure elevated at 154/72 and 152/74, likely secondary to a degree of dietary indiscretion. Currently on telmisartan 40 mg twice daily and metoprolol succinate 100mg  once daily. Previous issues with spironolactone due to hyperkalemia. Amlodipine not preferred due to potential for worsening peripheral edema. HR borderline for increasing beta blocker.  - Monitor blood pressure at home - Patient plans to  set up blood pressure monitor to send readings to Dr. Donnamarie Poag office (PCP), will request that patient send to [facility] as well. - No acute changes since BP previously stable on same regimen. Follow up with clinical pharmacist in 2-3 weeks for further blood pressure management  Coronary artery calcification Coronary artery calcification noted on high res lung CT scan. No angina reported. Importance of managing cholesterol emphasized.  - Start aspirin 81 mg daily - Check LDL level today, continue simvastatin 20mg  for now - low threshold for coronary CTA with anginal symptoms  Liver cirrhosis Recent imaging suggests possible cirrhosis with irregular liver margin. Potential contributor to lower extremity edema. Labs overall normal, but monitoring required. - Continue monitoring liver function with Dr. Orson Aloe  Follow-up Follow-up plans discussed for ongoing management of conditions.  - Schedule follow-up with Dr. Eden Emms or  me in 4 months - Coordinate with pulmonology for sleep study and oxygen therapy - Follow up with clinical pharmacist for blood pressure management             Signed, Perlie Gold, PA-C

## 2023-03-21 NOTE — Patient Instructions (Addendum)
 Lab Work: DIRECT LDL BMET  PROBNP  If you have labs (blood work) drawn today and your tests are completely normal, you will receive your results only by: MyChart Message (if you have MyChart) OR A paper copy in the mail If you have any lab test that is abnormal or we need to change your treatment, we will call you to review the results.  REFERRAL TO PHARM D   Follow-Up: At [facility], you and your health needs are our priority.  As part of our continuing mission to provide you with exceptional heart care, we have created designated Provider Care Teams.  These Care Teams include your primary Cardiologist (physician) and Advanced Practice Providers (APPs -  Physician Assistants and Nurse Practitioners) who all work together to provide you with the care you need, when you need it.   Your next appointment:   4 month(s)  Provider:   Charlton Haws, MD     Other Instructions  Heart-Healthy Eating Plan Eating a healthy diet is important for the health of your heart. A heart-healthy eating plan includes: Eating less unhealthy fats. Eating more healthy fats. Eating less salt in your food. Salt is also called sodium. Making other changes in your diet. Talk with your doctor or a diet specialist (dietitian) to create an eating plan that is right for you. What is my plan? Your doctor may recommend an eating plan that includes: Total fat: ______% or less of total calories a day. Saturated fat: ______% or less of total calories a day. Cholesterol: less than _________mg a day. Sodium: less than _________mg a day. What are tips for following this plan? Cooking Avoid frying your food. Try to bake, boil, grill, or broil it instead. You can also reduce fat by: Removing the skin from poultry. Removing all visible fats from meats. Steaming vegetables in water or broth. Meal planning  At meals, divide your plate into four equal parts: Fill one-half of your plate with vegetables and  green salads. Fill one-fourth of your plate with whole grains. Fill one-fourth of your plate with lean protein foods. Eat 2-4 cups of vegetables per day. One cup of vegetables is: 1 cup (91 g) broccoli or cauliflower florets. 2 medium carrots. 1 large bell pepper. 1 large sweet potato. 1 large tomato. 1 medium white potato. 2 cups (150 g) raw leafy greens. Eat 1-2 cups of fruit per day. One cup of fruit is: 1 small apple 1 large banana 1 cup (237 g) mixed fruit, 1 large orange,  cup (82 g) dried fruit, 1 cup (240 mL) 100% fruit juice. Eat more foods that have soluble fiber. These are apples, broccoli, carrots, beans, peas, and barley. Try to get 20-30 g of fiber per day. Eat 4-5 servings of nuts, legumes, and seeds per week: 1 serving of dried beans or legumes equals  cup (90 g) cooked. 1 serving of nuts is  oz (12 almonds, 24 pistachios, or 7 walnut halves). 1 serving of seeds equals  oz (8 g). General information Eat more home-cooked food. Eat less restaurant, buffet, and fast food. Limit or avoid alcohol. Limit foods that are high in starch and sugar. Avoid fried foods. Lose weight if you are overweight. Keep track of how much salt (sodium) you eat. This is important if you have high blood pressure. Ask your doctor to tell you more about this. Try to add vegetarian meals each week. Fats Choose healthy fats. These include olive oil and canola oil, flaxseeds, walnuts, almonds,  and seeds. Eat more omega-3 fats. These include salmon, mackerel, sardines, tuna, flaxseed oil, and ground flaxseeds. Try to eat fish at least 2 times each week. Check food labels. Avoid foods with trans fats or high amounts of saturated fat. Limit saturated fats. These are often found in animal products, such as meats, butter, and cream. These are also found in plant foods, such as palm oil, palm kernel oil, and coconut oil. Avoid foods with partially hydrogenated oils in them. These have trans  fats. Examples are stick margarine, some tub margarines, cookies, crackers, and other baked goods. What foods should I eat? Fruits All fresh, canned (in natural juice), or frozen fruits. Vegetables Fresh or frozen vegetables (raw, steamed, roasted, or grilled). Green salads. Grains Most grains. Choose whole wheat and whole grains most of the time. Rice and pasta, including brown rice and pastas made with whole wheat. Meats and other proteins Lean, well-trimmed beef, veal, pork, and lamb. Chicken and [place] without skin. All fish and shellfish. Wild duck, rabbit, pheasant, and venison. Egg whites or low-cholesterol egg substitutes. Dried beans, peas, lentils, and tofu. Seeds and most nuts. Dairy Low-fat or nonfat cheeses, including ricotta and mozzarella. Skim or 1% milk that is liquid, powdered, or evaporated. Buttermilk that is made with low-fat milk. Nonfat or low-fat yogurt. Fats and oils Non-hydrogenated (trans-free) margarines. Vegetable oils, including soybean, sesame, sunflower, olive, peanut, safflower, corn, canola, and cottonseed. Salad dressings or mayonnaise made with a vegetable oil. Beverages Mineral water. Coffee and tea. Diet carbonated beverages. Sweets and desserts Sherbet, gelatin, and fruit ice. Small amounts of dark chocolate. Limit all sweets and desserts. Seasonings and condiments All seasonings and condiments. The items listed above may not be a complete list of foods and drinks you can eat. Contact a dietitian for more options. What foods should I avoid? Fruits Canned fruit in heavy syrup. Fruit in cream or butter sauce. Fried fruit. Limit coconut. Vegetables Vegetables cooked in cheese, cream, or butter sauce. Fried vegetables. Grains Breads that are made with saturated or trans fats, oils, or whole milk. Croissants. Sweet rolls. Donuts. High-fat crackers, such as cheese crackers. Meats and other proteins Fatty meats, such as hot dogs, ribs, sausage, bacon,  rib-eye roast or steak. High-fat deli meats, such as salami and bologna. Caviar. Domestic duck and goose. Organ meats, such as liver. Dairy Cream, sour cream, cream cheese, and creamed cottage cheese. Whole-milk cheeses. Whole or 2% milk that is liquid, evaporated, or condensed. Whole buttermilk. Cream sauce or high-fat cheese sauce. Yogurt that is made from whole milk. Fats and oils Meat fat, or shortening. Cocoa butter, hydrogenated oils, palm oil, coconut oil, palm kernel oil. Solid fats and shortenings, including bacon fat, salt pork, lard, and butter. Nondairy cream substitutes. Salad dressings with cheese or sour cream. Beverages Regular sodas and juice drinks with added sugar. Sweets and desserts Frosting. Pudding. Cookies. Cakes. Pies. Milk chocolate or white chocolate. Buttered syrups. Full-fat ice cream or ice cream drinks. The items listed above may not be a complete list of foods and drinks to avoid. Contact a dietitian for more information. Summary Heart-healthy meal planning includes eating less unhealthy fats, eating more healthy fats, and making other changes in your diet. Eat a balanced diet. This includes fruits and vegetables, low-fat or nonfat dairy, lean protein, nuts and legumes, whole grains, and heart-healthy oils and fats. This information is not intended to replace advice given to you by your health care provider. Make sure you discuss any questions you have  with your health care provider. Document Revised: 01/23/2021 Document Reviewed: 01/23/2021 Elsevier Patient Education  2024 Elsevier Inc.   DASH Eating Plan DASH stands for Dietary Approaches to Stop Hypertension. The DASH eating plan is a healthy eating plan that has been shown to: Lower high blood pressure (hypertension). Reduce your risk for type 2 diabetes, heart disease, and stroke. Help with weight loss. What are tips for following this plan? Reading food labels Check food labels for the amount of salt  (sodium) per serving. Choose foods with less than 5 percent of the Daily Value (DV) of sodium. In general, foods with less than 300 milligrams (mg) of sodium per serving fit into this eating plan. To find whole grains, look for the word "whole" as the first word in the ingredient list. Shopping Buy products labeled as "low-sodium" or "no salt added." Buy fresh foods. Avoid canned foods and pre-made or frozen meals. Cooking Try not to add salt when you cook. Use salt-free seasonings or herbs instead of table salt or sea salt. Check with your health care provider or pharmacist before using salt substitutes. Do not fry foods. Cook foods in healthy ways, such as baking, boiling, grilling, roasting, or broiling. Cook using oils that are good for your heart. These include olive, canola, avocado, soybean, and sunflower oil. Meal planning  Eat a balanced diet. This should include: 4 or more servings of fruits and 4 or more servings of vegetables each day. Try to fill half of your plate with fruits and vegetables. 6-8 servings of whole grains each day. 6 or less servings of lean meat, poultry, or fish each day. 1 oz is 1 serving. A 3 oz (85 g) serving of meat is about the same size as the palm of your hand. One egg is 1 oz (28 g). 2-3 servings of low-fat dairy each day. One serving is 1 cup (237 mL). 1 serving of nuts, seeds, or beans 5 times each week. 2-3 servings of heart-healthy fats. Healthy fats called omega-3 fatty acids are found in foods such as walnuts, flaxseeds, fortified milks, and eggs. These fats are also found in cold-water fish, such as sardines, salmon, and mackerel. Limit how much you eat of: Canned or prepackaged foods. Food that is high in trans fat, such as fried foods. Food that is high in saturated fat, such as fatty meat. Desserts and other sweets, sugary drinks, and other foods with added sugar. Full-fat dairy products. Do not salt foods before eating. Do not eat more than 4  egg yolks a week. Try to eat at least 2 vegetarian meals a week. Eat more home-cooked food and less restaurant, buffet, and fast food. Lifestyle When eating at a restaurant, ask if your food can be made with less salt or no salt. If you drink alcohol: Limit how much you have to: 0-1 drink a day if you are male. 0-2 drinks a day if you are male. Know how much alcohol is in your drink. In the [place] one drink is one 12 oz bottle of beer (355 mL), one 5 oz glass of wine (148 mL), or one 1 oz glass of hard liquor (44 mL). General information Avoid eating more than 2,300 mg of salt a day. If you have hypertension, you may need to reduce your sodium intake to 1,500 mg a day. Work with your provider to stay at a healthy body weight or lose weight. Ask what the best weight range is for you. On most days of the  week, get at least 30 minutes of exercise that causes your heart to beat faster. This may include walking, swimming, or biking. Work with your provider or dietitian to adjust your eating plan to meet your specific calorie needs. What foods should I eat? Fruits All fresh, dried, or frozen fruit. Canned fruits that are in their natural juice and do not have sugar added to them. Vegetables Fresh or frozen vegetables that are raw, steamed, roasted, or grilled. Low-sodium or reduced-sodium tomato and vegetable juice. Low-sodium or reduced-sodium tomato sauce and tomato paste. Low-sodium or reduced-sodium canned vegetables. Grains Whole-grain or whole-wheat bread. Whole-grain or whole-wheat pasta. Brown rice. Orpah Cobb. Bulgur. Whole-grain and low-sodium cereals. Pita bread. Low-fat, low-sodium crackers. Whole-wheat flour tortillas. Meats and other proteins Skinless chicken or [place]. Ground chicken or [place]. Pork with fat trimmed off. Fish and seafood. Egg whites. Dried beans, peas, or lentils. Unsalted nuts, nut butters, and seeds. Unsalted canned beans. Lean cuts of beef with fat trimmed  off. Low-sodium, lean precooked or cured meat, such as sausages or meat loaves. Dairy Low-fat (1%) or fat-free (skim) milk. Reduced-fat, low-fat, or fat-free cheeses. Nonfat, low-sodium ricotta or cottage cheese. Low-fat or nonfat yogurt. Low-fat, low-sodium cheese. Fats and oils Soft margarine without trans fats. Vegetable oil. Reduced-fat, low-fat, or light mayonnaise and salad dressings (reduced-sodium). Canola, safflower, olive, avocado, soybean, and sunflower oils. Avocado. Seasonings and condiments Herbs. Spices. Seasoning mixes without salt. Other foods Unsalted popcorn and pretzels. Fat-free sweets. The items listed above may not be all the foods and drinks you can have. Talk to a dietitian to learn more. What foods should I avoid? Fruits Canned fruit in a light or heavy syrup. Fried fruit. Fruit in cream or butter sauce. Vegetables Creamed or fried vegetables. Vegetables in a cheese sauce. Regular canned vegetables that are not marked as low-sodium or reduced-sodium. Regular canned tomato sauce and paste that are not marked as low-sodium or reduced-sodium. Regular tomato and vegetable juices that are not marked as low-sodium or reduced-sodium. Rosita Fire. Olives. Grains Baked goods made with fat, such as croissants, muffins, or some breads. Dry pasta or rice meal packs. Meats and other proteins Fatty cuts of meat. Ribs. Fried meat. Tomasa Blase. Bologna, salami, and other precooked or cured meats, such as sausages or meat loaves, that are not lean and low in sodium. Fat from the back of a pig (fatback). Bratwurst. Salted nuts and seeds. Canned beans with added salt. Canned or smoked fish. Whole eggs or egg yolks. Chicken or [place] with skin. Dairy Whole or 2% milk, cream, and half-and-half. Whole or full-fat cream cheese. Whole-fat or sweetened yogurt. Full-fat cheese. Nondairy creamers. Whipped toppings. Processed cheese and cheese spreads. Fats and oils Butter. Stick margarine. Lard.  Shortening. Ghee. Bacon fat. Tropical oils, such as coconut, palm kernel, or palm oil. Seasonings and condiments Onion salt, garlic salt, seasoned salt, table salt, and sea salt. Worcestershire sauce. Tartar sauce. Barbecue sauce. Teriyaki sauce. Soy sauce, including reduced-sodium soy sauce. Steak sauce. Canned and packaged gravies. Fish sauce. Oyster sauce. Cocktail sauce. Store-bought horseradish. Ketchup. Mustard. Meat flavorings and tenderizers. Bouillon cubes. Hot sauces. Pre-made or packaged marinades. Pre-made or packaged taco seasonings. Relishes. Regular salad dressings. Other foods Salted popcorn and pretzels. The items listed above may not be all the foods and drinks you should avoid. Talk to a dietitian to learn more. Where to find more information National Heart, Lung, and Blood Institute (NHLBI): BuffaloDryCleaner.gl American Heart Association (AHA): heart.org Academy of Nutrition and Dietetics: eatright.org National Kidney  Foundation (NKF): kidney.org This information is not intended to replace advice given to you by your health care provider. Make sure you discuss any questions you have with your health care provider. Document Revised: 01/04/2022 Document Reviewed: 01/04/2022 Elsevier Patient Education  2024 Elsevier Inc.     1st Floor: - Lobby - Registration  - Pharmacy  - Lab - Cafe  2nd Floor: - PV Lab - Diagnostic Testing (echo, CT, nuclear med)  3rd Floor: - Vacant  4th Floor: - TCTS (cardiothoracic surgery) - AF[facility] Clinic - Structural Heart Clinic - Vascular Surgery  - Vascular Ultrasound  5th Floor: - [facility] (general and EP) - Clinical Pharmacy for coumadin, hypertension, lipid, weight-loss medications, and med management appointments    Valet parking services will be available as well.

## 2023-03-22 ENCOUNTER — Ambulatory Visit: Payer: Medicare Other | Admitting: Cardiology

## 2023-03-22 LAB — BASIC METABOLIC PANEL
BUN/Creatinine Ratio: 20 (ref 10–24)
BUN: 18 mg/dL (ref 8–27)
CO2: 24 mmol/L (ref 20–29)
Calcium: 9.9 mg/dL (ref 8.6–10.2)
Chloride: 99 mmol/L (ref 96–106)
Creatinine, Ser: 0.89 mg/dL (ref 0.76–1.27)
Glucose: 116 mg/dL — ABNORMAL HIGH (ref 70–99)
Potassium: 5 mmol/L (ref 3.5–5.2)
Sodium: 141 mmol/L (ref 134–144)
eGFR: 90 mL/min/{1.73_m2} (ref >59)

## 2023-03-22 LAB — LDL CHOLESTEROL, DIRECT: LDL Direct: 83 mg/dL (ref 0–99)

## 2023-03-22 LAB — PRO B NATRIURETIC PEPTIDE: NT-Pro BNP: 335 pg/mL (ref 0–376)

## 2023-03-25 ENCOUNTER — Telehealth: Payer: Self-pay | Admitting: *Deleted

## 2023-03-25 DIAGNOSIS — Z79899 Other long term (current) drug therapy: Secondary | ICD-10-CM

## 2023-03-25 DIAGNOSIS — E7800 Pure hypercholesterolemia, unspecified: Secondary | ICD-10-CM

## 2023-03-25 MED ORDER — ROSUVASTATIN CALCIUM 20 MG PO TABS: 20.0000 mg | ORAL_TABLET | Freq: Every day | ORAL | 1 refills | Status: DC

## 2023-03-25 NOTE — Telephone Encounter (Signed)
-----   Message from Perlie Gold sent at 03/25/2023 10:40 AM EDT ----- Normal renal function/normal electrolytes. NT-Pro BNP (heart failure lab) is increased from previous check but still notably improved from when I saw patient in October. This is likely a reflection of recently elevated blood pressures. Elevated BP can worsen fluid management, encourage patient to monitor daily weights. He may take an extra Lasix for weight gain >3lbs in 24 hours.   LDL of 83 is higher than goal of less than 75. Recommend switching from Simvastatin to Crestor 20mg . If patient agreeable to this change, would need LFT and lipid panel in 6-8 weeks.

## 2023-03-25 NOTE — Telephone Encounter (Signed)
 The patient has been notified of the result and verbalized understanding.  All questions (if any) were answered.  Advised the pt to start dry weighing himself daily, and if weight gain of >3lbs in a 24 hr time period is noted, he may administer an additional dose of lasix at that time.   Advised the pt to STOP simvastatin and start crestor 20 mg po daily.  Pt is aware that we will order for him to get repeat lipids/LFTs in 6-8 weeks, to reassess his numbers.   Confirmed the pharmacy of choice with the pt.   Repeat lipids/LFTs in 6-8 weeks placed and released in the system.    Pt aware to mark this down on his calendar to have done around 05/20.  He is aware to go fasting to this lab appt.   Pt did note he received his BP monitor, and his numbers are slowly improving with last reading this morning at 143/73 HR-58 bpm.  He states he will continue to monitor and report accordingly.  Pt verbalized understanding and agrees with this plan.

## 2023-03-27 ENCOUNTER — Ambulatory Visit: Admitting: Physical Therapy

## 2023-03-27 ENCOUNTER — Encounter: Payer: Self-pay | Admitting: Physical Therapy

## 2023-03-27 VITALS — BP 140/57 | HR 67

## 2023-03-27 DIAGNOSIS — M6281 Muscle weakness (generalized): Secondary | ICD-10-CM | POA: Diagnosis not present

## 2023-03-27 DIAGNOSIS — R269 Unspecified abnormalities of gait and mobility: Secondary | ICD-10-CM

## 2023-03-27 NOTE — Therapy (Signed)
 OUTPATIENT PHYSICAL THERAPY NEURO TREATMENT   [name] MRN: [id number] DOB:08/04/1950, 73 y.o., male Today's Date: 03/27/2023   PCP: Emilio Aspen, MD REFERRING PROVIDER: Emilio Aspen, MD  END OF SESSION:  PT End of Session - 03/27/23 1325     Visit Number 3    Number of Visits 4    Date for PT Re-Evaluation 04/24/23    Authorization Type Medicare    PT Start Time 1322    PT Stop Time 1400    PT Time Calculation (min) 38 min    Equipment Utilized During Treatment Gait belt    Activity Tolerance Patient tolerated treatment well    Behavior During Therapy WFL for tasks assessed/performed             Past Medical History:  Diagnosis Date   Arthritis    Back pain    Radiates down both legs   BPH (benign prostatic hyperplasia)    Cancer (HCC) 2022   prostate cancer   COPD (chronic obstructive pulmonary disease) (HCC)    Dyspnea    Enlarged prostate    GERD (gastroesophageal reflux disease)    Glaucoma    H/O hiatal hernia    H/O wheezing    occ inhaler usage   Heart murmur    Hypertension    Lumbar foraminal stenosis    Macular degeneration    Meningioma (HCC) 2014   Treated with radiation   Neuromuscular disorder (HCC)    Neuropathy bilateral legs   PONV (postoperative nausea and vomiting)    Pulmonary nodules    resolved on 2019 follow up CT   S/P insertion of spinal cord stimulator    Past Surgical History:  Procedure Laterality Date   ABDOMINAL EXPOSURE N/A 03/16/2022   Procedure: ABDOMINAL EXPOSURE;  Surgeon: Cephus Shelling, MD;  Location: [facility] OR;  Service: Vascular;  Laterality: N/A;   ANTERIOR LUMBAR FUSION N/A 03/16/2022   Procedure: Anterior Lumbar Interbody Fusion - Lumbar Five-Sacral One with cage;  Surgeon: Julio Sicks, MD;  Location: [facility] OR;  Service: Neurosurgery;  Laterality: N/A;   BACK SURGERY  06/08/2006   lam x3 cerv x 1   CERVICAL FUSION     COLONOSCOPY  2020   CYSTOSCOPY     73 yrs old    CYSTOSCOPY WITH URETHRAL DILATATION N/A 09/21/2021   Procedure: CYSTOSCOPY WITH BALLOON DILATION and OPTILUME BALLOON DILATION OF  URETHRAL DILATATION;  Surgeon: Noel Christmas, MD;  Location: [facility] ORS;  Service: Urology;  Laterality: N/A;   EYE SURGERY     bil   GOLD SEED IMPLANT N/A 09/06/2020   Procedure: GOLD SEED IMPLANT/ PLACEMENT OF FIDUCIAL MARKERS;  Surgeon: Noel Christmas, MD;  Location: [facility] ORS;  Service: Urology;  Laterality: N/A;   KNEE ARTHROSCOPY  01/02/2008   lft   KNEE ARTHROSCOPY Left 07/23/2012   Procedure: LEFT MEDIAL MENISCAL DEBRIDEMENT AND CHONDROPLASTY;  Surgeon: Loanne Drilling, MD;  Location: [facility] ORS;  Service: Orthopedics;  Laterality: Left;   LUMBAR FUSION  02/22/2014   DR POOL   PILONIDAL CYST EXCISION     SINUS EXPLORATION     SPACE OAR INSTILLATION N/A 09/06/2020   Procedure: SPACE OAR INSTILLATION;  Surgeon: Noel Christmas, MD;  Location: [facility] OR[facility];  Service: Urology;  Laterality: N/A;   SPINAL CORD STIMULATOR INSERTION N/A 01/16/2018   Procedure: LUMBAR SPINAL CORD STIMULATOR INSERTION;  Surgeon: Tia Alert, MD;  Location: [facility] OR;  Service: Neurosurgery;  Laterality: N/A;  LUMBAR SPINAL CORD STIMULATOR INSERTION   TRANSURETHRAL RESECTION OF PROSTATE N/A 09/06/2020   Procedure: TRANSURETHRAL RESECTION OF THE PROSTATE (TURP);  Surgeon: Noel Christmas, MD;  Location: [facility] ORS;  Service: Urology;  Laterality: N/A;  2 HRS   WRIST GANGLION EXCISION     rt   WRIST SURGERY  01/02/2007   cyst lft   Patient Active Problem List   Diagnosis Date Noted   Acute heart failure with preserved ejection fraction (HCC) 11/12/2022   Mild mitral regurgitation 10/30/2022   Aortic valve stenosis 10/30/2022   COPD (chronic obstructive pulmonary disease) (HCC) 01/11/2022   GERD (gastroesophageal reflux disease) 01/11/2022   Hypercholesterolemia 01/11/2022   Hypertension 01/11/2022   Lumbago with sciatica, left side 01/11/2022   Lumbago with sciatica, right side  01/11/2022   Obesity (BMI 30.0-34.9) 01/11/2022   Other chronic pain 01/11/2022   Pulmonary nodule 01/11/2022   Lumbar foraminal stenosis 01/11/2022   Aortic ectasia (HCC) 01/11/2022   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   Malignant neoplasm of prostate (HCC) 07/12/2020   S/P insertion of spinal cord stimulator 01/16/2018   Back pain with history of spinal surgery 09/09/2017   Inflammation of sacroiliac joint (HCC) 09/20/2016   Opioid dependence (HCC) 01/10/2016   Swelling of lower limb 03/25/2014   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   Complains of low back pain 09/09/2013   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   Lumbosacral radiculitis 12/03/2012   Spinal stenosis of lumbar region 10/30/2012   Meningioma (HCC) 10/28/2012   Lumbosacral spondylosis without myelopathy 10/23/2012   Acute medial meniscal tear 07/22/2012   Dizziness 06/25/2012   Backache 06/25/2012   Benign neoplasm of cerebral meninges (HCC) 06/25/2012   Neck pain 06/25/2012   Lumbar stenosis with neurogenic claudication 05/08/2011    ONSET DATE: 01/28/2023 (referral date)  REFERRING DIAG: R29.6 (ICD-10-CM) - Falls frequently R26.81 (ICD-10-CM) - Unsteady gait  THERAPY DIAG:  Muscle weakness (generalized)  Abnormality of gait and mobility  Rationale for Evaluation and Treatment: Rehabilitation  SUBJECTIVE:                                                                                                                                                                                             SUBJECTIVE STATEMENT: Patient arrives to session with bariatric rollator. Patient denies falls and near falls since last here. They have not looked into a fall button since last here and also reports continued use of cane for short distances. Patient has also did not follow up about widening bathroom but reports is able to fit modified 2WW into bathroom.  Pt accompanied by:  self and wife June  PERTINENT HISTORY: extensive lumbar history with numerous surgeries totaling around 6 (laminectomy, multiple lumbar fusions most recent was last year), meningoma, spinal stenosis with neurogrenic claudication, HF, COPD   PAIN:  Are you having pain? Yes: NPRS scale: 3/10 Pain location: around hips Pain description: achy Aggravating factors: standing for long periods of time Relieving factors: laying on down in fetal postion  PRECAUTIONS: Fall  RED FLAGS: Incontinence following prostate cancer and radiation treatment    WEIGHT BEARING RESTRICTIONS: No (wrist no longer has restrictions from fx)  FALLS: Has patient fallen in last 6 months? Yes. Number of falls reports 5-6 falls in the last 6 months, reports being able to get up by self  when grabs onto something, one wrist fracture that has since healed   LIVING ENVIRONMENT: Lives with: lives with their spouse Lives in: House/apartment Stairs: Yes: External: 4-5 stairs, 3 in back steps; can reach both in front, and on the R on the way up in back plus grab bars  Has following equipment at home:  canes, scooter, lift chair, canes, bariatric rollator  PLOF:  Modified independence with some help for dressing and does not drive due to macular degeneration, needs help getting compression socks on  PATIENT GOALS: "Trying to regain some balance."  OBJECTIVE:  Note: Objective measures were completed at Evaluation unless otherwise noted.  DIAGNOSTIC FINDINGS:   09/14/2022 CT Head: IMPRESSION: No change in a 15 mm meningioma projecting infromedial from the right tentorium. No new abnormality  COGNITION: Overall cognitive status: Impaired - reports some short term memory concerns   Reports not wanting to trial memory impairments                                                                                         TREATMENT:    Self Care:  Vitals:   03/27/23 1330  BP: (!) 140/57  Pulse: 67   Vitals assessed  as note above, elevated but WFL for therapy Reviewed options to improve safety in home as patient not interested in moving at this time  Reviewed fall recovery button - strongly recommended for spouse piece of mind with patient, provided printout of where resources can be given Reviewed widening doors Reviewed that cane should not be used at any time  Gait: Gait pattern: decreased hip/knee flexion- Right, decreased hip/knee flexion- Left, decreased ankle dorsiflexion- Right, decreased ankle dorsiflexion- Left, and lateral hip instability, mild hyperextension of L knee Distance walked: various clinic distances Assistive device utilized:  Metallurgist and trial of bariatric 2WW Level of assistance: SBA Comments: keeps rollator far out from body due to flexed posture, mild improvement when cued, not significant gait improvements with trial of 2WW  TherEx: Reviewed basic HEP in session Supine March - 10 reps Supine Single Knee to Chest Stretch  - 2 sets - 30 seconds  hold Standing March with Counter Support - 15 reps Side Stepping with Counter Support - 8 reps Sit to Stand with Counter Support - 5 reps  PATIENT EDUCATION: Education details: See self care section above Person educated: Patient and Caregiver wife Education method:  Explanation Education comprehension: verbalized understanding and needs further education  HOME EXERCISE PROGRAM: Access Code: [email or web address] URL: [email or web address] Date: 03/27/2023 Prepared by: Maryruth Eve  Exercises - Supine March  - 1 x daily - 7 x weekly - 3 sets - 10 reps - Supine Single Knee to Chest Stretch  - 1 x daily - 7 x weekly - 3 sets - 30 seconds  hold - Standing March with Counter Support  - 1 x daily - 7 x weekly - 3 sets - 15 reps - Side Stepping with Counter Support  - 1 x daily - 7 x weekly - 3 sets - 8 reps - Sit to Stand with Counter Support  - 1 x daily - 7 x weekly - 3 sets - 5 reps  GOALS: Goals reviewed with  patient? Yes  LONG TERM GOALS: Target date: 04/24/2023  Patient will report demonstrate independence with final HEP in order to maintain current gains and continue to progress after physical therapy discharge.   Baseline: To be provided Goal status: INITIAL  2.  Patient will demonstrate ability to peform 5xSTS without UE to demonstrated improved planning with transfers. Baseline: strong reliance on UE Goal status: INITIAL  3.  Patient will improve gait speed to 0.48 m/s or greater with LRAD to indicate an improvement from being a household ambulator to limited community ambulator.   Baseline: 0.39 m/s with bariatric rollator (SBA)  Goal status: INITIAL  4.  Patient will improve TUG score to 20 seconds or less with LRAD to indicate a decreased risk of falls and demonstrate improved overall mobility.   Baseline: 24.36 seconds with bariatric rollator  Goal status: INITIAL  5.  Assess / write LTG Baseline: To be assessed  Goal status: D/C due to POC length   ASSESSMENT:  CLINICAL IMPRESSION: Skilled physical therapy session emphasized review on falls safety. Patient has been non-compliant with PT recommendations from prior session including continued use of cane and has not made active effort to look into fall button. Introduced Engineer, agricultural and reviewed safety. Continue POC as able.    OBJECTIVE IMPAIRMENTS: Abnormal gait, decreased balance, decreased cognition, decreased ROM, decreased strength, decreased safety awareness, increased edema, impaired sensation, impaired vision/preception, and pain.   ACTIVITY LIMITATIONS: carrying, standing, squatting, transfers, dressing, and reach over head  PARTICIPATION LIMITATIONS: meal prep, cleaning, laundry, community activity, and yard work  PERSONAL FACTORS: Age, Time since onset of injury/illness/exacerbation, and 3+ comorbidities: see above  are also affecting patient's functional outcome.   REHAB POTENTIAL: Fair may be limited by  chronicity of deficits and comboridites  CLINICAL DECISION MAKING: Stable/uncomplicated  EVALUATION COMPLEXITY: Low  PLAN:  PT FREQUENCY: 1x/week  PT DURATION: 4 weeks  PLANNED INTERVENTIONS: 97164- PT Re-evaluation, 97110-Therapeutic exercises, 97530- Therapeutic activity, 97112- Neuromuscular re-education, 97535- Self Care, 95621- Manual therapy, and 97116- Gait training  PLAN FOR NEXT SESSION: basic balance and strength HEP and any additional recommendations for home safety, did patient follow up fall button etc, review HEP and D/C  Carmelia Bake, PT, DPT 03/27/2023, 2:37 PM

## 2023-04-01 DIAGNOSIS — I700 Atherosclerosis of aorta: Secondary | ICD-10-CM | POA: Diagnosis not present

## 2023-04-01 DIAGNOSIS — I10 Essential (primary) hypertension: Secondary | ICD-10-CM | POA: Diagnosis not present

## 2023-04-01 DIAGNOSIS — E7800 Pure hypercholesterolemia, unspecified: Secondary | ICD-10-CM | POA: Diagnosis not present

## 2023-04-01 DIAGNOSIS — J449 Chronic obstructive pulmonary disease, unspecified: Secondary | ICD-10-CM | POA: Diagnosis not present

## 2023-04-03 ENCOUNTER — Ambulatory Visit: Attending: Internal Medicine | Admitting: Physical Therapy

## 2023-04-03 ENCOUNTER — Encounter: Payer: Self-pay | Admitting: Physical Therapy

## 2023-04-03 DIAGNOSIS — M6281 Muscle weakness (generalized): Secondary | ICD-10-CM | POA: Diagnosis not present

## 2023-04-03 DIAGNOSIS — R269 Unspecified abnormalities of gait and mobility: Secondary | ICD-10-CM | POA: Insufficient documentation

## 2023-04-03 NOTE — Therapy (Signed)
 OUTPATIENT PHYSICAL THERAPY NEURO TREATMENT   [name] MRN: [id number] DOB:Dec 25, 1950, 73 y.o., male Today's Date: 04/03/2023   PCP: Cody Aspen, MD REFERRING PROVIDER: Emilio Aspen, MD\  PHYSICAL THERAPY DISCHARGE SUMMARY  Visits from Start of Care: 4  Current functional level related to goals / functional outcomes: See below, achieved 2/4 LTG   Remaining deficits: Poor compliance with PT education on appropriate AD and fall safety, non compliant with HEP   Education / Equipment: Continue HEP and reviewed safety measures    Patient agrees to discharge. Patient goals were partially met. Patient is being discharged due to  poor compliance with PT recommendations .   END OF SESSION:  PT End of Session - 04/03/23 1323     Visit Number 4    Number of Visits 4    Date for PT Re-Evaluation 04/24/23    Authorization Type Medicare    PT Start Time 1323    PT Stop Time 1347    PT Time Calculation (min) 24 min    Equipment Utilized During Treatment Gait belt    Activity Tolerance Patient tolerated treatment well    Behavior During Therapy WFL for tasks assessed/performed             Past Medical History:  Diagnosis Date   Arthritis    Back pain    Radiates down both legs   BPH (benign prostatic hyperplasia)    Cancer (HCC) 2022   prostate cancer   COPD (chronic obstructive pulmonary disease) (HCC)    Dyspnea    Enlarged prostate    GERD (gastroesophageal reflux disease)    Glaucoma    H/O hiatal hernia    H/O wheezing    occ inhaler usage   Heart murmur    Hypertension    Lumbar foraminal stenosis    Macular degeneration    Meningioma (HCC) 2014   Treated with radiation   Neuromuscular disorder (HCC)    Neuropathy bilateral legs   PONV (postoperative nausea and vomiting)    Pulmonary nodules    resolved on 2019 follow up CT   S/P insertion of spinal cord stimulator    Past Surgical History:  Procedure  Laterality Date   ABDOMINAL EXPOSURE N/A 03/16/2022   Procedure: ABDOMINAL EXPOSURE;  Surgeon: Cody Shelling, MD;  Location: [facility] OR;  Service: Vascular;  Laterality: N/A;   ANTERIOR LUMBAR FUSION N/A 03/16/2022   Procedure: Anterior Lumbar Interbody Fusion - Lumbar Five-Sacral One with cage;  Surgeon: Cody Sicks, MD;  Location: [facility] OR;  Service: Neurosurgery;  Laterality: N/A;   BACK SURGERY  06/08/2006   lam x3 cerv x 1   CERVICAL FUSION     COLONOSCOPY  2020   CYSTOSCOPY     73 yrs old   CYSTOSCOPY WITH URETHRAL DILATATION N/A 09/21/2021   Procedure: CYSTOSCOPY WITH BALLOON DILATION and OPTILUME BALLOON DILATION OF  URETHRAL DILATATION;  Surgeon: Cody Christmas, MD;  Location: [facility] ORS;  Service: Urology;  Laterality: N/A;   EYE SURGERY     bil   GOLD SEED IMPLANT N/A 09/06/2020   Procedure: GOLD SEED IMPLANT/ PLACEMENT OF FIDUCIAL MARKERS;  Surgeon: Cody Christmas, MD;  Location: [facility] ORS;  Service: Urology;  Laterality: N/A;   KNEE ARTHROSCOPY  01/02/2008   lft   KNEE ARTHROSCOPY Left 07/23/2012   Procedure: LEFT MEDIAL MENISCAL DEBRIDEMENT AND CHONDROPLASTY;  Surgeon: Cody Drilling, MD;  Location: [facility] ORS;  Service: Orthopedics;  Laterality:  Left;   LUMBAR FUSION  02/22/2014   DR POOL   PILONIDAL CYST EXCISION     SINUS EXPLORATION     SPACE OAR INSTILLATION N/A 09/06/2020   Procedure: SPACE OAR INSTILLATION;  Surgeon: Cody Christmas, MD;  Location: [facility] ORS;  Service: Urology;  Laterality: N/A;   SPINAL CORD STIMULATOR INSERTION N/A 01/16/2018   Procedure: LUMBAR SPINAL CORD STIMULATOR INSERTION;  Surgeon: Cody Alert, MD;  Location: [facility] OR;  Service: Neurosurgery;  Laterality: N/A;  LUMBAR SPINAL CORD STIMULATOR INSERTION   TRANSURETHRAL RESECTION OF PROSTATE N/A 09/06/2020   Procedure: TRANSURETHRAL RESECTION OF THE PROSTATE (TURP);  Surgeon: Cody Christmas, MD;  Location: [facility] ORS;  Service: Urology;  Laterality: N/A;  2 HRS   WRIST GANGLION EXCISION     rt   WRIST  SURGERY  01/02/2007   cyst lft   Patient Active Problem List   Diagnosis Date Noted   Acute heart failure with preserved ejection fraction (HCC) 11/12/2022   Mild mitral regurgitation 10/30/2022   Aortic valve stenosis 10/30/2022   COPD (chronic obstructive pulmonary disease) (HCC) 01/11/2022   GERD (gastroesophageal reflux disease) 01/11/2022   Hypercholesterolemia 01/11/2022   Hypertension 01/11/2022   Lumbago with sciatica, left side 01/11/2022   Lumbago with sciatica, right side 01/11/2022   Obesity (BMI 30.0-34.9) 01/11/2022   Other chronic pain 01/11/2022   Pulmonary nodule 01/11/2022   Lumbar foraminal stenosis 01/11/2022   Aortic ectasia (HCC) 01/11/2022   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   Malignant neoplasm of prostate (HCC) 07/12/2020   S/P insertion of spinal cord stimulator 01/16/2018   Back pain with history of spinal surgery 09/09/2017   Inflammation of sacroiliac joint (HCC) 09/20/2016   Opioid dependence (HCC) 01/10/2016   Swelling of lower limb 03/25/2014   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   Complains of low back pain 09/09/2013   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   Lumbosacral radiculitis 12/03/2012   Spinal stenosis of lumbar region 10/30/2012   Meningioma (HCC) 10/28/2012   Lumbosacral spondylosis without myelopathy 10/23/2012   Acute medial meniscal tear 07/22/2012   Dizziness 06/25/2012   Backache 06/25/2012   Benign neoplasm of cerebral meninges (HCC) 06/25/2012   Neck pain 06/25/2012   Lumbar stenosis with neurogenic claudication 05/08/2011    ONSET DATE: 01/28/2023 (referral date)  REFERRING DIAG: R29.6 (ICD-10-CM) - Falls frequently R26.81 (ICD-10-CM) - Unsteady gait  THERAPY DIAG:  Muscle weakness (generalized)  Abnormality of gait and mobility  Rationale for Evaluation and Treatment: Rehabilitation  SUBJECTIVE:  SUBJECTIVE STATEMENT: Patient still reports partial compliance with walker and continues to use canes but less than previously. Patient denies falls and near falls since last here.   Pt accompanied by: self and wife June  PERTINENT HISTORY: extensive lumbar history with numerous surgeries totaling around 6 (laminectomy, multiple lumbar fusions most recent was last year), meningoma, spinal stenosis with neurogrenic claudication, HF, COPD   PAIN:  Are you having pain? Yes: NPRS scale: 3/10 Pain location: around hips Pain description: achy Aggravating factors: standing for long periods of time Relieving factors: laying on down in fetal postion  PRECAUTIONS: Fall  RED FLAGS: Incontinence following prostate cancer and radiation treatment    WEIGHT BEARING RESTRICTIONS: No (wrist no longer has restrictions from fx)  FALLS: Has patient fallen in last 6 months? Yes. Number of falls reports 5-6 falls in the last 6 months, reports being able to get up by self  when grabs onto something, one wrist fracture that has since healed   LIVING ENVIRONMENT: Lives with: lives with their spouse Lives in: House/apartment Stairs: Yes: External: 4-5 stairs, 3 in back steps; can reach both in front, and on the R on the way up in back plus grab bars  Has following equipment at home:  canes, scooter, lift chair, canes, bariatric rollator  PLOF:  Modified independence with some help for dressing and does not drive due to macular degeneration, needs help getting compression socks on  PATIENT GOALS: "Trying to regain some balance."  OBJECTIVE:  Note: Objective measures were completed at Evaluation unless otherwise noted.  DIAGNOSTIC FINDINGS:   09/14/2022 CT Head: IMPRESSION: No change in a 15 mm meningioma projecting infromedial from the right tentorium. No new  abnormality  COGNITION: Overall cognitive status: Impaired - reports some short term memory concerns   Reports not wanting to trial memory impairments                                                                                         TREATMENT:    Self Care:  Oxygen readings 94-95% - advised close monitoring and follow up with med team as indicated, educated on appropriate oxygen ranges stated should not be dropping below 90%, patient to have pulmonary function testing Reviewed safety education on appropriate AD and compliance with HEP, patient verbalizes understanding as had in the past with subsequent poor compliance   TherAct:  [facility] PT Assessment - 04/03/23 0001       Standardized Balance Assessment   Standardized Balance Assessment Five Times Sit to Stand;Timed Up and Go Test;10 meter walk test    Five times sit to stand comments  9.90   seconds with strong reliance on UE, able to achieve partial stand without UE support but limited by anterior weight shift   10 Meter Walk 0.59 m/s   with bariatric rollator (SBA)     Timed Up and Go Test   Normal TUG (seconds) 19.3   seconds with bariatric rollator (SBA)           Discussed progress on goals and safety measures   PATIENT EDUCATION: Education details: See self care section above Person educated: Patient  and Caregiver wife Education method: Explanation Education comprehension: verbalized understanding  HOME EXERCISE PROGRAM: Access Code: [email or web address] URL: [email or web address] Date: 03/27/2023 Prepared by: Maryruth Eve  Exercises - Supine March  - 1 x daily - 7 x weekly - 3 sets - 10 reps - Supine Single Knee to Chest Stretch  - 1 x daily - 7 x weekly - 3 sets - 30 seconds  hold - Standing March with Counter Support  - 1 x daily - 7 x weekly - 3 sets - 15 reps - Side Stepping with Counter Support  - 1 x daily - 7 x weekly - 3 sets - 8 reps - Sit to Stand with Counter Support  - 1 x daily - 7 x weekly  - 3 sets - 5 reps  GOALS: Goals reviewed with patient? Yes  LONG TERM GOALS: Target date: 04/24/2023  Patient will report demonstrate independence with final HEP in order to maintain current gains and continue to progress after physical therapy discharge.   Baseline: To be provided, reports understanding of exercises but is not performing at home Goal status: NOT MET  2.  Patient will demonstrate ability to peform 5xSTS without UE to demonstrated improved planning with transfers. Baseline: strong reliance on UE, unable to complete without UE use - can complete partial stand Goal status: NOT MET  3.  Patient will improve gait speed to 0.48 m/s or greater with LRAD to indicate an improvement from being a household ambulator to limited community ambulator.   Baseline: 0.39 m/s with bariatric rollator (SBA); improved to 0.59 m/s with bariatric rollator  Goal status: MET  4.  Patient will improve TUG score to 20 seconds or less with LRAD to indicate a decreased risk of falls and demonstrate improved overall mobility.   Baseline: 24.36 seconds with bariatric rollator; improved to 19.3 seconds with barriatric rollator  Goal status: MET  5.  Assess / write LTG Baseline: To be assessed  Goal status: D/C due to POC length   ASSESSMENT:  CLINICAL IMPRESSION: Patient is being discharged from skilled physical theapy due to maxmized rehab potential at this time given poor compliance to PT recommendations for fall and AD safety. Patient notably did improve on TUG and gait speed; however, falls risk is ongoing. PT educated both patient and spouse on appropriate safety measures and they verbalize understanding. D/C with HEP with patient with no questions about program.   OBJECTIVE IMPAIRMENTS: Abnormal gait, decreased balance, decreased cognition, decreased ROM, decreased strength, decreased safety awareness, increased edema, impaired sensation, impaired vision/preception, and pain.   ACTIVITY  LIMITATIONS: carrying, standing, squatting, transfers, dressing, and reach over head  PARTICIPATION LIMITATIONS: meal prep, cleaning, laundry, community activity, and yard work  PERSONAL FACTORS: Age, Time since onset of injury/illness/exacerbation, and 3+ comorbidities: see above  are also affecting patient's functional outcome.   REHAB POTENTIAL: Fair may be limited by chronicity of deficits and comboridites  CLINICAL DECISION MAKING: Stable/uncomplicated  EVALUATION COMPLEXITY: Low  PLAN:  PT FREQUENCY: 1x/week  PT DURATION: 4 weeks  PLANNED INTERVENTIONS: 97164- PT Re-evaluation, 97110-Therapeutic exercises, 97530- Therapeutic activity, [id number]- Neuromuscular re-education, 97535- Self Care, [id number]- Manual therapy, and [id number]- Gait training  PLAN FOR NEXT SESSION: NA - D/C with HEP  Carmelia Bake, PT, DPT 04/03/2023, 1:55 PM

## 2023-04-09 DIAGNOSIS — H353121 Nonexudative age-related macular degeneration, left eye, early dry stage: Secondary | ICD-10-CM | POA: Diagnosis not present

## 2023-04-09 DIAGNOSIS — H401331 Pigmentary glaucoma, bilateral, mild stage: Secondary | ICD-10-CM | POA: Diagnosis not present

## 2023-04-09 DIAGNOSIS — H353212 Exudative age-related macular degeneration, right eye, with inactive choroidal neovascularization: Secondary | ICD-10-CM | POA: Diagnosis not present

## 2023-04-10 ENCOUNTER — Ambulatory Visit: Admitting: Physical Therapy

## 2023-04-15 DIAGNOSIS — I10 Essential (primary) hypertension: Secondary | ICD-10-CM | POA: Diagnosis not present

## 2023-04-15 DIAGNOSIS — I700 Atherosclerosis of aorta: Secondary | ICD-10-CM | POA: Diagnosis not present

## 2023-04-15 DIAGNOSIS — J449 Chronic obstructive pulmonary disease, unspecified: Secondary | ICD-10-CM | POA: Diagnosis not present

## 2023-04-16 DIAGNOSIS — H353123 Nonexudative age-related macular degeneration, left eye, advanced atrophic without subfoveal involvement: Secondary | ICD-10-CM | POA: Diagnosis not present

## 2023-04-16 DIAGNOSIS — H401132 Primary open-angle glaucoma, bilateral, moderate stage: Secondary | ICD-10-CM | POA: Diagnosis not present

## 2023-04-16 DIAGNOSIS — H353231 Exudative age-related macular degeneration, bilateral, with active choroidal neovascularization: Secondary | ICD-10-CM | POA: Diagnosis not present

## 2023-04-16 DIAGNOSIS — H43813 Vitreous degeneration, bilateral: Secondary | ICD-10-CM | POA: Diagnosis not present

## 2023-04-16 DIAGNOSIS — H353114 Nonexudative age-related macular degeneration, right eye, advanced atrophic with subfoveal involvement: Secondary | ICD-10-CM | POA: Diagnosis not present

## 2023-04-16 DIAGNOSIS — Z961 Presence of intraocular lens: Secondary | ICD-10-CM | POA: Diagnosis not present

## 2023-04-21 ENCOUNTER — Other Ambulatory Visit: Payer: Self-pay | Admitting: Pulmonary Disease

## 2023-04-23 ENCOUNTER — Ambulatory Visit: Payer: Medicare Other | Admitting: Internal Medicine

## 2023-04-23 ENCOUNTER — Ambulatory Visit: Payer: Self-pay | Admitting: Internal Medicine

## 2023-04-23 ENCOUNTER — Encounter: Payer: Self-pay | Admitting: Internal Medicine

## 2023-04-23 VITALS — BP 166/77 | HR 75 | Ht 66.5 in | Wt 235.0 lb

## 2023-04-23 DIAGNOSIS — R911 Solitary pulmonary nodule: Secondary | ICD-10-CM

## 2023-04-23 DIAGNOSIS — Z87891 Personal history of nicotine dependence: Secondary | ICD-10-CM | POA: Diagnosis not present

## 2023-04-23 DIAGNOSIS — J441 Chronic obstructive pulmonary disease with (acute) exacerbation: Secondary | ICD-10-CM

## 2023-04-23 DIAGNOSIS — R9389 Abnormal findings on diagnostic imaging of other specified body structures: Secondary | ICD-10-CM | POA: Diagnosis not present

## 2023-04-23 MED ORDER — DOXYCYCLINE HYCLATE 100 MG PO TABS: ORAL_TABLET | ORAL | 0 refills | Status: DC

## 2023-04-23 MED ORDER — PREDNISONE 10 MG PO TABS
ORAL_TABLET | ORAL | 0 refills | Status: DC
Start: 2023-04-23 — End: 2023-05-02

## 2023-04-23 MED ORDER — BREZTRI AEROSPHERE 160-9-4.8 MCG/ACT IN AERO: 2.0000 | INHALATION_SPRAY | Freq: Two times a day (BID) | RESPIRATORY_TRACT | Status: DC

## 2023-04-23 MED ORDER — BREZTRI AEROSPHERE 160-9-4.8 MCG/ACT IN AERO: 2.0000 | INHALATION_SPRAY | Freq: Two times a day (BID) | RESPIRATORY_TRACT | 11 refills | Status: DC

## 2023-04-23 NOTE — Patient Instructions (Addendum)
 ICD-10-CM   1. COPD with acute exacerbation (HCC)  J44.1     2. Abnormal CT of the chest  R93.89     3. Stopped smoking with greater than 40 pack year history  Z87.891       # Concerned you have mild to moderate COPD exacerbation associated with mowing the grass and pollen # PET scan December 2024 was showing improvement compared to November 2024.  But the size of the inflammation there is fairly significant compared to 2019  PLAN - Take doxycycline  100mg  po twice daily x 5 days; take after meals and avoid sunlight - Please take prednisone  40 mg x1 day, then 30 mg x1 day, then 20 mg x1 day, then 10 mg x1 day, and then 5 mg x1 day and stop - Keep pulmonary function test appointment May 2025 - Do CT chest without contrast in 8-12 weeks [that will be 6 months since the PET scan]   Follow-up - Return to see Dr. Bertrum Brodie or nurse practitioner in 8-12 weeks

## 2023-04-23 NOTE — Telephone Encounter (Signed)
 Copied from CRM [id number]. Topic: Clinical - Red Word Triage >> Apr 23, 2023  9:08 AM Hilton Lucky wrote: Red Word that prompted transfer to Nurse Triage: Seeking medical advisement - states has cough and fever - wants to know if needs to reschedule appointments for today concerning breathing test.  TRIAGE SUMMARY NOTE: Pt wife reporting that pt been experiencing nasal congestion for almost a week, now coughing throughout the night, coughing up yellow sputum and "been drinking coffee so brown right now," low grade fever today, pt denies more SOB than normal but been using his rescue inhaler a few times a day, more than usual. Pt wife reporting that pt's pulse ox is usually around 98% but been 92-94% the past few days. Advised pt be examined today for symptoms. Pt wife confirms pt has appt scheduled today for PFT with regular office visit to follow. Warm transferred to CAL, confirmed that regular office visit today will now be a sick visit, pt wife verbalized understanding, and CAL speaking with wife now to reschedule PFT.  E2C2 Pulmonary Triage - Initial Assessment Questions "Chief Complaint (e.g., cough, sob, wheezing, fever, chills, sweat or additional symptoms) *Go to specific symptom protocol after initial questions. Fever 99.1 Coughing right much, coughing up sputum, been drinking coffee so brown right now, yellow other times Mowed yard last Wednesday, thought got him stopped up, pt thinks a cold Wife and pt couldn't tell if more SOB than normal No chest pain, dizziness, weakness  "How long have symptoms been present?" Couple days now with cough, fever started today  MEDICINES:   "Have you used any OTC meds to help with symptoms?" Yes If yes, ask "What medications?" Ibuprofen, cough med  "Have you used your inhalers/maintenance medication?" Yes If yes, "What medications?" Trelegy, combivent  rescue inhaler, few times a day  If inhaler, ask "How many puffs and how often?" Note: Review  instructions on medication in the chart. Using combivent  more often lately,   OXYGEN: "Do you wear supplemental oxygen?" No  "Do you monitor your oxygen levels?" Yes If yes, "What is your reading (oxygen level) today?" 92-94%  "What is your usual oxygen saturation reading?"  (Note: Pulmonary O2 sats should be 90% or greater) 98%  Reason for Disposition  [1] Continuous (nonstop) coughing interferes with work or school AND [2] no improvement using cough treatment per Care Advice  Answer Assessment - Initial Assessment Questions 2. SEVERITY: "How bad is the cough today?"      Coughing a lot, coughs through the night, wakes him up 7. CARDIAC HISTORY: "Do you have any history of heart disease?" (e.g., heart attack, congestive heart failure)      significant 8. LUNG HISTORY: "Do you have any history of lung disease?"  (e.g., pulmonary embolus, asthma, emphysema)     significant 9. PE RISK FACTORS: "Do you have a history of blood clots?" (or: recent major surgery, recent prolonged travel, bedridden)     denies 10. OTHER SYMPTOMS: "Do you have any other symptoms?" (e.g., runny nose, wheezing, chest pain)       Some wheezing yesterday don't know  Protocols used: Cough - Acute Productive-A-AH

## 2023-04-23 NOTE — Progress Notes (Signed)
 OV 11/15/2022  Subjective:  Patient ID: Cody Tate, male , DOB: February 12, 1950 , age 73 y.o. , MRN: [id number] , ADDRESS: [address] [place] [address] PCP Benedetta Bradley, MD Patient Care Team: Benedetta Bradley, MD as PCP - General (Internal Medicine) Loyde Rule, MD as PCP - Cardiology (Cardiology) Roxane Copp, MD as Consulting Physician (Urology) Kenith Payer, MD as Consulting Physician (Radiation Oncology) Debbie Fails, Laura Polio, NP as Nurse Practitioner (Hematology and Oncology) Neda Balk, RN as Registered Nurse  This Provider for this visit: Treatment Team:  Attending Provider: Maire Scot, MD    11/15/2022 -   Chief Complaint  Patient presents with   Acute Visit    Increased SOB over the past 2 months. He states his breathing is worse esp when he wakes up in the am.      HPI Cody Tate 73 y.o. -follow-up 5 2 pack smoker.  Acute visit.  He does not have a chest x-ray or CT scan for more than 5 years in the health system here he is here for an acute visit.  But is a transfer of care from Dr. Marygrace Snellen to Dr. Bertrum Brodie.  This is because his close friend was Mr. Arne Bevel who was my patient with IPF and who subsequently died from it.  He is here with his wife.  He tells me that it was booked as an acute visit because he really thought he had an appointment today but he did not have an appointment.  He tells me that he has had chronic sinus congestion going on for several years.  His sputum is white or yellow because of the sinus drainage.  But he also has chronic shortness of breath insidious onset getting worse since spring 2024.  Particularly after some cardiac medication was changed.  No associated chest pain.  He has had detailed cardiac workup.  His last imaging was many years ago of the chest.  But he is a past heavy smoker.   He has significant obesity.  He was in the [employer]  He has a long back scar from  surgery.  This extends into the thoracic lumbar and sacral spine.   Feno 5 ppbc   Cardiac stress test January 2020 -- Nuclear stress EF: 67%. The left ventricular ejection fraction is hyperdynamic (>65%). Baseline EKG showed NSR with inferolateral T wave inversions. No changes with Lexiscan  infusions. The study is normal. This is a low risk study.  Echocardiogram November 2024 - Grade 1 diastolic dysfunction   Blood work - BNP 10/30/2022 elevated 543 and then improved to 227 in November 2024. -Normal hemoglobin March 2024  PFT -he had 1 on January 20, 2013 that shows moderate obstruction with an FEV1 of 2.2 L / 75% with a ratio of 65.      No data to display             01/16/2023 Discussed the use of AI scribe software for clinical note transcription with the patient, who gave verbal consent to proceed.  History of Present Illness   The patient, with a history of emphysema, coronary artery disease, and diastolic dysfunction, presented for a follow-up visit after a recent shortness of breath episode. The patient had undergone several tests, including lab work, HRCT scan, and a PET scan. The lab results indicated a mild elevation in BNP, suggesting possible fluid retention due to heart function, and positive allergy  results for various grasses. The patient's  alpha 1 level was normal, ruling out a genetic predisposition for COPD.  The PET scan showed residual post-infectious or inflammatory scarring in the right upper lobe of the lung, likely due to a recent respiratory infection. The patient reported a history of coughing and mucus production a few months prior, which has since resolved. The PET scan also revealed evidence of previous radiation to the prostate but no signs of metastatic disease.  The patient's HRCT scan showed emphysema and consolidation in the right upper lobe, which has improved and left some scarring. The patient also has coronary artery calcification,  indicating some plaque buildup, and an irregular liver margin, suggesting possible cirrhosis. The patient reported a history of moderate alcohol  consumption years ago.  The patient is currently on a regimen of Trelegy for maintenance and Combivent  Respimat as a rescue inhaler for his emphysema. The frequency of Combivent  use varies, sometimes not needed, other times used once or twice a day. The patient also takes Lasix  and Jardiance  for heart-related issues and Simvastatin  for cholesterol management.  The patient reported some issues with fluid retention, which fluctuates and affects his mobility. He also expressed a desire for improved breathing, which may be affected by the fluid retention. The patient does not currently use oxygen and has not had a breathing test since 2015.      OV 04/23/2023  Subjective:  Patient ID: Cody Tate, male , DOB: January 22, 1950 , age 76 y.o. , MRN: [id number] , ADDRESS: [address] [place] [address] PCP Benedetta Bradley, MD Patient Care Team: Benedetta Bradley, MD as PCP - General (Internal Medicine) Loyde Rule, MD as PCP - Cardiology (Cardiology) Roxane Copp, MD as Consulting Physician (Urology) Kenith Payer, MD as Consulting Physician (Radiation Oncology) Debbie Fails, Laura Polio, NP as Nurse Practitioner (Hematology and Oncology) Neda Balk, RN as Registered Nurse  This Provider for this visit: Treatment Team:  Attending Provider: Maire Scot, MD    04/23/2023 -   Chief Complaint  Patient presents with   Acute Visit    Increased cough x 3 days. He has had some yellow sputum. Breathing has been slightly worse. He mowed grass last wk without a mask.      HPI Cody Tate 73 y.o. -returns for follow-up for his COPD associated with right upper lobe lung density/scarring [seen in November 2024 with improvement and PET scan December 2024].  He says a week ago he is here with his wife.  He says a week  ago/16/25 was mowing the grass has been outdoors then from 04/21/2023 is having cough congestion yellow sputum wheezing.  All this has gotten better.  Today was having a low-grade fever.  He is still not back to baseline still having the symptoms.  Of note he did have a PET scan in December 2020 for additional improvement there but still the significant size of the scar tissue is partially compared to 2019 when it was all clear.  I would recommend a follow-up CT.  He has upcoming pulmonary function test May 2025.  Of note he is on Trelegy but he prefers Breztri .  We gave him a sample and will send a prescription.   IMPRESSION:  CHEST: Residual platelike opacity in the posterior right upper lobe (series 7/image 33), suggesting post infectious inflammatory scarring. Prior cavitary right perihilar lesion is no longer visualized. No suspicious pulmonary nodules.    Residual post infectious/inflammatory scarring in the right upper lobe.   Diminutive prostate with adjacent  fiducial markers, suggesting radiation changes.   No findings suspicious for metastatic disease.     Electronically Signed   By: Zadie Herter M.D.   On: 01/10/2023 01:13   LAB RESULTS last 96 hours No results found.    Allergies  Allergen Reactions   Other Itching, Rash and Other (See Comments)    VICRYL Sutures   Tape Itching, Other (See Comments) and Rash    Occlusive tape and bandaids      has a past medical history of Arthritis, Back pain, BPH (benign prostatic hyperplasia), Cancer (HCC) (2022), COPD (chronic obstructive pulmonary disease) (HCC), Dyspnea, Enlarged prostate, GERD (gastroesophageal reflux disease), Glaucoma, H/O hiatal hernia, H/O wheezing, Heart murmur, Hypertension, Lumbar foraminal stenosis, Macular degeneration, Meningioma (HCC) (2014), Neuromuscular disorder (HCC), PONV (postoperative nausea and vomiting), Pulmonary nodules, and S/P insertion of spinal cord stimulator.   reports that  he quit smoking about 22 years ago. His smoking use included cigarettes. He started smoking about 58 years ago. He has a 52.5 pack-year smoking history. He has never used smokeless tobacco.  Past Surgical History:  Procedure Laterality Date   ABDOMINAL EXPOSURE N/A 03/16/2022   Procedure: ABDOMINAL EXPOSURE;  Surgeon: Young Hensen, MD;  [facility];  Service: Vascular;  Laterality: N/A;   ANTERIOR LUMBAR FUSION N/A 03/16/2022   Procedure: Anterior Lumbar Interbody Fusion - Lumbar Five-Sacral One with cage;  Surgeon: Agustina Aldrich, MD;  Location: [facility] OR;  Service: Neurosurgery;  Laterality: N/A;   BACK SURGERY  06/08/2006   lam x3 cerv x 1   CERVICAL FUSION     COLONOSCOPY  2020   CYSTOSCOPY     73 yrs old   CYSTOSCOPY WITH URETHRAL DILATATION N/A 09/21/2021   Procedure: CYSTOSCOPY WITH BALLOON DILATION and OPTILUME BALLOON DILATION OF  URETHRAL DILATATION;  Surgeon: Roxane Copp, MD;  Location: [facility] ORS;  Service: Urology;  Laterality: N/A;   EYE SURGERY     bil   GOLD SEED IMPLANT N/A 09/06/2020   Procedure: GOLD SEED IMPLANT/ PLACEMENT OF FIDUCIAL MARKERS;  Surgeon: Roxane Copp, MD;  Location: [facility] ORS;  Service: Urology;  Laterality: N/A;   KNEE ARTHROSCOPY  01/02/2008   lft   KNEE ARTHROSCOPY Left 07/23/2012   Procedure: LEFT MEDIAL MENISCAL DEBRIDEMENT AND CHONDROPLASTY;  Surgeon: Aurther Blue, MD;  Location: [facility] ORS;  Service: Orthopedics;  Laterality: Left;   LUMBAR FUSION  02/22/2014   DR POOL   PILONIDAL CYST EXCISION     SINUS EXPLORATION     SPACE OAR INSTILLATION N/A 09/06/2020   Procedure: SPACE OAR INSTILLATION;  Surgeon: Roxane Copp, MD;  Location: [facility] ORS;  Service: Urology;  Laterality: N/A;   SPINAL CORD STIMULATOR INSERTION N/A 01/16/2018   Procedure: LUMBAR SPINAL CORD STIMULATOR INSERTION;  Surgeon: Isadora Mar, MD;  Location[facility] OR;  Service: Neurosurgery;  Laterality: N/A;  LUMBAR SPINAL CORD STIMULATOR INSERTION   TRANSURETHRAL RESECTION  OF PROSTATE N/A 09/06/2020   Procedure: TRANSURETHRAL RESECTION OF THE PROSTATE (TURP);  Surgeon: Roxane Copp, MD;  Location: [facility] ORS;  Service: Urology;  Laterality: N/A;  2 HRS   WRIST GANGLION EXCISION     rt   WRIST SURGERY  01/02/2007   cyst lft    Allergies  Allergen Reactions   Other Itching, Rash and Other (See Comments)    VICRYL Sutures   Tape Itching, Other (See Comments) and Rash    Occlusive tape and bandaids    Immunization History  Administered Date(s)  Administered   DTP 06/01/2001   Fluad Quad(high Dose 65+) 09/16/2019   Influenza Split 10/16/2010, 10/08/2012, 10/24/2012, 10/20/2013   Influenza, High Dose Seasonal PF 10/22/2014, 09/07/2015   Influenza-Unspecified 10/03/2018, 11/02/2022   PFIZER(Purple Top)SARS-COV-2 Vaccination 02/13/2019, 03/10/2019, 10/05/2019, 04/19/2020   Pfizer Covid-19 Vaccine Bivalent Booster 5yrs & up 09/29/2020   Pneumococcal Conjugate-13 10/22/2014   Pneumococcal Polysaccharide-23 02/10/2015   Tdap 09/18/2011, 05/17/2012   Zoster Recombinant(Shingrix) 08/18/2018   Zoster, Live 08/04/2010    Family History  Problem Relation Age of Onset   Hypertension Mother    Bone cancer Mother    Stroke Father 56   COPD Maternal Uncle      Current Outpatient Medications:    Ascorbic Acid  (VITAMIN C ) 1000 MG tablet, Take 1,000 mg by mouth daily., Disp: , Rfl:    aspirin  EC 81 MG tablet, Take 1 tablet (81 mg total) by mouth daily. Swallow whole., Disp: , Rfl:    budeson-glycopyrrolate -formoterol  (BREZTRI  AEROSPHERE) 160-9-4.8 MCG/ACT AERO inhaler, Inhale 2 puffs into the lungs in the morning and at bedtime., Disp: , Rfl:    celecoxib  (CELEBREX ) 200 MG capsule, TAKE 1 TO 2 CAPSULES BY MOUTH DAILY WITH FOOD for 90, Disp: , Rfl:    chlorhexidine  (HIBICLENS ) 4 % external liquid, Apply 1 Application topically daily as needed (boils)., Disp: , Rfl:    Cholecalciferol  (VITAMIN D3) 50 MCG (2000 UT) capsule, Take 4,000 Units by mouth daily., Disp:  , Rfl:    diphenhydrAMINE  (BENADRYL  ALLERGY ) 25 MG tablet, Take 25 mg by mouth at bedtime., Disp: , Rfl:    doxycycline  (VIBRA -TABS) 100 MG tablet, Take doxycycline  100mg  po twice daily x 5 days; take after meals and avoid sunlight, Disp: 10 tablet, Rfl: 0   empagliflozin  (JARDIANCE ) 10 MG TABS tablet, TAKE 1 TABLET(10 MG) BY MOUTH DAILY BEFORE BREAKFAST, Disp: 90 tablet, Rfl: 2   finasteride  (PROSCAR ) 5 MG tablet, Take 5 mg by mouth daily., Disp: , Rfl:    fluticasone  (FLONASE ) 50 MCG/ACT nasal spray, Place 2 sprays into both nostrils daily. , Disp: , Rfl:    gabapentin  (NEURONTIN ) 300 MG capsule, Take 300 mg by mouth 4 (four) times daily., Disp: , Rfl:    Guaifenesin  (MUCINEX  MAXIMUM STRENGTH) 1200 MG TB12, Take 1,200 mg by mouth 2 (two) times daily., Disp: , Rfl:    ibuprofen (ADVIL,MOTRIN) 200 MG tablet, Take 400 mg by mouth 4 (four) times daily as needed for moderate pain., Disp: , Rfl:    Ipratropium-Albuterol  (COMBIVENT  RESPIMAT) 20-100 MCG/ACT AERS respimat, Inhale 1 puff into the lungs every 6 (six) hours as needed for wheezing or shortness of breath., Disp: 12 g, Rfl: 3   Lutein-Zeaxanthin 25-5 MG CAPS, Take by mouth., Disp: , Rfl:    magnesium  hydroxide (MILK OF MAGNESIA) 400 MG/5ML suspension, Take 15 mLs by mouth at bedtime., Disp: , Rfl:    metoprolol  succinate (TOPROL -XL) 100 MG 24 hr tablet, Take 100 mg by mouth at bedtime. Take with or immediately following a meal., Disp: , Rfl:    montelukast  (SINGULAIR ) 10 MG tablet, TAKE 1 TABLET BY MOUTH AT  BEDTIME, Disp: 90 tablet, Rfl: 3   Multiple Vitamin (MULTIVITAMIN WITH MINERALS) TABS tablet, Take 1 tablet by mouth daily., Disp: , Rfl:    omeprazole  (PRILOSEC) 40 MG capsule, Take 1 capsule (40 mg total) by mouth daily before breakfast., Disp: 90 capsule, Rfl: 3   Oxycodone  HCl 10 MG TABS, Take 10 mg by mouth every 4 (four) hours as needed (pain)., Disp: , Rfl:  oxymetazoline  (AFRIN) 0.05 % nasal spray, Place 2 sprays into both nostrils  2 (two) times daily as needed for congestion., Disp: , Rfl:    predniSONE  (DELTASONE ) 10 MG tablet, Please take prednisone  40 mg x1 day, then 30 mg x1 day, then 20 mg x1 day, then 10 mg x1 day, and then 5 mg x1 day and stop, Disp: 11 tablet, Rfl: 0   Propylene Glycol, PF, (SYSTANE COMPLETE PF) 0.6 % SOLN, Place 1 drop into both eyes 3 (three) times daily as needed (dry eyes)., Disp: , Rfl:    pseudoephedrine  (SUDAFED) 30 MG tablet, Take 60 mg by mouth 3 (three) times daily., Disp: , Rfl:    rosuvastatin  (CRESTOR ) 20 MG tablet, Take 1 tablet (20 mg total) by mouth daily., Disp: 90 tablet, Rfl: 1   Simethicone  125 MG CAPS, Take 250 mg by mouth 4 (four) times daily as needed (for gas)., Disp: , Rfl:    sodium chloride  (OCEAN) 0.65 % SOLN nasal spray, Place 1 spray into both nostrils as needed for congestion. As needed, Disp: , Rfl:    Tamsulosin  HCl (FLOMAX ) 0.4 MG CAPS, Take 0.4 mg by mouth 2 (two) times daily., Disp: , Rfl:    telmisartan  (MICARDIS ) 40 MG tablet, Take 40 mg by mouth daily., Disp: , Rfl:    timolol (TIMOPTIC) 0.5 % ophthalmic solution, 1 drop every morning., Disp: , Rfl:    TRELEGY ELLIPTA  200-62.5-25 MCG/ACT AEPB, USE 1 INHALATION BY MOUTH ONCE  DAILY AT THE SAME TIME EACH DAY, Disp: 180 each, Rfl: 3   furosemide  (LASIX ) 20 MG tablet, Take 1 tablet (20 mg total) by mouth daily., Disp: , Rfl:       Objective:   Vitals:   04/23/23 1509  BP: (!) 166/77  Pulse: 75  SpO2: 94%  Weight: 235 lb (106.6 kg)  Height: 5' 6.5" (1.689 m)    Estimated body mass index is 37.36 kg/m as calculated from the following:   Height as of this encounter: 5' 6.5" (1.689 m).   Weight as of this encounter: 235 lb (106.6 kg).  @WEIGHTCHANGE @  [employer]   04/23/23 1509  Weight: 235 lb (106.6 kg)     Physical Exam   General: No distress. Looks well O2 at rest: no Cane present: no Sitting in wheel chair: no Frail: no Obese: no Neuro: Alert and Oriented x 3. GCS 15. Speech  normal Psych: Pleasant Resp:  Barrel Chest - no.  Wheeze - no, Crackles - no, No overt respiratory distress CVS: Normal heart sounds. Murmurs - no Ext: Stigmata of Connective Tissue Disease - no HEENT: Normal upper airway. PEERL +. No post nasal drip        Assessment:       ICD-10-CM   1. COPD with acute exacerbation (HCC)  J44.1     2. Abnormal CT of the chest  R93.89     3. Stopped smoking with greater than 40 pack year history  Z87.891     4. Solitary pulmonary nodule  R91.1 CT Chest Wo Contrast         Plan:     Patient Instructions     ICD-10-CM   1. COPD with acute exacerbation (HCC)  J44.1     2. Abnormal CT of the chest  R93.89     3. Stopped smoking with greater than 40 pack year history  Z87.891       # Concerned you have mild to moderate COPD exacerbation associated with mowing  the grass and pollen # PET scan December 2024 was showing improvement compared to November 2024.  But the size of the inflammation there is fairly significant compared to 2019  PLAN - Take doxycycline  100mg  po twice daily x 5 days; take after meals and avoid sunlight - Please take prednisone  40 mg x1 day, then 30 mg x1 day, then 20 mg x1 day, then 10 mg x1 day, and then 5 mg x1 day and stop - Keep pulmonary function test appointment May 2025 - Do CT chest without contrast in 8-12 weeks [that will be 6 months since the PET scan]   Follow-up - Return to see Dr. Bertrum Brodie or nurse practitioner in 8-12 weeks   FOLLOWUP Return in about 11 weeks (around 07/09/2023) for 15 min visit, with any of the APPS, with Dr Bertrum Brodie, Face to Face Visit.    SIGNATURE    Dr. Maire Scot, M.D., F.C.C.P,  Pulmonary and Critical Care Medicine Staff Physician, [facility] Center Director - Interstitial Lung Disease  Program  Pulmonary Fibrosis [facility] [place], [place], [address]  Pager: [phone], If no answer or between  15:00h -  7:00h: call [phone] Telephone: [phone]  3:41 PM 04/23/2023

## 2023-04-29 DIAGNOSIS — H401132 Primary open-angle glaucoma, bilateral, moderate stage: Secondary | ICD-10-CM | POA: Diagnosis not present

## 2023-04-29 DIAGNOSIS — H353114 Nonexudative age-related macular degeneration, right eye, advanced atrophic with subfoveal involvement: Secondary | ICD-10-CM | POA: Diagnosis not present

## 2023-04-29 DIAGNOSIS — H43813 Vitreous degeneration, bilateral: Secondary | ICD-10-CM | POA: Diagnosis not present

## 2023-04-29 DIAGNOSIS — H353231 Exudative age-related macular degeneration, bilateral, with active choroidal neovascularization: Secondary | ICD-10-CM | POA: Diagnosis not present

## 2023-04-29 DIAGNOSIS — H353123 Nonexudative age-related macular degeneration, left eye, advanced atrophic without subfoveal involvement: Secondary | ICD-10-CM | POA: Diagnosis not present

## 2023-04-29 DIAGNOSIS — Z961 Presence of intraocular lens: Secondary | ICD-10-CM | POA: Diagnosis not present

## 2023-05-02 ENCOUNTER — Ambulatory Visit (INDEPENDENT_AMBULATORY_CARE_PROVIDER_SITE_OTHER)

## 2023-05-02 ENCOUNTER — Ambulatory Visit: Admitting: Adult Health

## 2023-05-02 ENCOUNTER — Ambulatory Visit: Payer: Self-pay | Admitting: Internal Medicine

## 2023-05-02 ENCOUNTER — Encounter: Payer: Self-pay | Admitting: Adult Health

## 2023-05-02 VITALS — BP 150/70 | HR 88 | Temp 98.3°F | Ht 66.5 in | Wt 234.0 lb

## 2023-05-02 DIAGNOSIS — J189 Pneumonia, unspecified organism: Secondary | ICD-10-CM | POA: Diagnosis not present

## 2023-05-02 DIAGNOSIS — J441 Chronic obstructive pulmonary disease with (acute) exacerbation: Secondary | ICD-10-CM

## 2023-05-02 DIAGNOSIS — R042 Hemoptysis: Secondary | ICD-10-CM

## 2023-05-02 DIAGNOSIS — R918 Other nonspecific abnormal finding of lung field: Secondary | ICD-10-CM | POA: Diagnosis not present

## 2023-05-02 DIAGNOSIS — Z87891 Personal history of nicotine dependence: Secondary | ICD-10-CM

## 2023-05-02 DIAGNOSIS — I700 Atherosclerosis of aorta: Secondary | ICD-10-CM | POA: Diagnosis not present

## 2023-05-02 MED ORDER — LEVOFLOXACIN 500 MG PO TABS: 500.0000 mg | ORAL_TABLET | Freq: Every day | ORAL | 0 refills | Status: DC

## 2023-05-02 MED ORDER — ALBUTEROL SULFATE (2.5 MG/3ML) 0.083% IN NEBU
2.5000 mg | INHALATION_SOLUTION | Freq: Once | RESPIRATORY_TRACT | Status: AC
  Administered 2023-05-02: 2.5 mg via RESPIRATORY_TRACT

## 2023-05-02 NOTE — Patient Instructions (Addendum)
 Begin Levaquin  500mg  daily for 10 days  Continue on Breztri  2 puffs Twice daily, rinse after use.  Robitussin DM 2 tsp every 4-6 hr As needed   Combivent  inhaler As needed   Fluids and rest  Follow up in 2 weeks with Dr. Bertrum Brodie or Ascher Schroepfer NP and As needed   Please contact office for sooner follow up if symptoms do not improve or worsen or seek emergency care

## 2023-05-02 NOTE — Assessment & Plan Note (Signed)
 Blood tinged mucus in setting of COPD excerbation/CAP- treat with antibiotics . If not IMproving or worsens will need to be admitted/further evaluation.

## 2023-05-02 NOTE — Telephone Encounter (Signed)
 Copied from CRM [id number]. Topic: Clinical - Red Word Triage >> May 02, 2023 10:01 AM Margarette Shawl wrote: Red Word that prompted transfer to Nurse Triage:   Was seen last week by provider for illness Was prescribed antibiotic and steroid  Still has cough, coughing up phlegm When he coughed up phlegm this morning, it contained blood.    Chief Complaint: Coughing up blood Symptoms: Coughing up blood Frequency: started this morning Pertinent Negatives: Patient denies fever, chest pain, difficulty breathing, abdominal pain Disposition: [] ED /[] Urgent Care (no appt availability in office) / [x] Appointment(In office/virtual)/ []  [facility]/ [] Home Care/ [] Refused Recommended Disposition /[] [facility]/ []  Follow-up with PCP Additional Notes: Patient's wife called and advised that her husband (the patient) was coughing up blood. Patient got on the phone and advised that when he coughs up mucous there is blood present.  He states the blood is not splattering out.  It was looking "pinkish" for a week or two but now it is bloody. Patient states that he has been taking a lot of ibuprofen. 94% this morning 152/70 this morning  95% during triage  Patient denies fever, difficulty breathing, chest pain, abdominal pain. Patient states that he takes 2 tablets of ibuprofen about 4 times a day. Appointment is made for this afternoon 05/02/2023 at 3:30 pm with Roena Clark NP  Patient and his wife are also advised that if anything gets worse to go directly to the Emergency Room. They verbalized understanding.   Reason for Disposition  [1] More than one episode of coughing up blood AND [2] < 1 tablespoon (15 ml) of pure red blood  Answer Assessment - Initial Assessment Questions E2C2 Pulmonary Triage - Initial Assessment Questions "Chief Complaint (e.g., cough, sob, wheezing, fever, chills, sweat or additional symptoms) *Go to specific symptom protocol after initial  questions. Coughing up blood  "How long have symptoms been present?" Since this morning  Have you tested for COVID or Flu? Note: If not, ask patient if a home test can be taken. If so, instruct patient to call back for positive results. No  MEDICINES:   "Have you used any OTC meds to help with symptoms?" Yes If yes, ask "What medications?" Robitussin DM Ibuprofen  "Have you used your inhalers/maintenance medication?" Yes If yes, "What medications?" Breztri ---2 puffs in the morning and at night Combivent -- as needed  If inhaler, ask "How many puffs and how often?" Note: Review instructions on medication in the chart. Breztri ---2 puffs in the morning and at night Combivent -- as needed  OXYGEN: "Do you wear supplemental oxygen?" No If yes, "How many liters are you supposed to use?" N/a  "Do you monitor your oxygen levels?" Yes If yes, "What is your reading (oxygen level) today?" 95% during triage  "What is your usual oxygen saturation reading?"  (Note: Pulmonary O2 sats should be 90% or greater) ---   1. ONSET: "When did the cough begin?"      2 weeks ago 2. SEVERITY: "How bad is the cough today?" "Did the blood appear after a coughing spell?"      A lot of hard coughing 3. SPUTUM: "Describe the color of your sputum" (none, dry cough; clear, white, yellow, green)     Mainly just bloody 4. HEMOPTYSIS: "How much blood?" (flecks, streaks, tablespoons, etc.)     Patient can't tell 5. DIFFICULTY BREATHING: "Are you having difficulty breathing?" If Yes, ask: "How bad is it?" (e.g., mild, moderate, severe)    - MILD: No SOB  at rest, mild SOB with walking, speaks normally in sentences, can lie down, no retractions, pulse < 100.    - MODERATE: SOB at rest, SOB with minimal exertion and prefers to sit, cannot lie down flat, speaks in phrases, mild retractions, audible wheezing, pulse 100-120.    - SEVERE: Very SOB at rest, speaks in single words, struggling to breathe, sitting  hunched forward, retractions, pulse > 120      Patient denies 6. FEVER: "Do you have a fever?" If Yes, ask: "What is your temperature, how was it measured, and when did it start?"     No 7. CARDIAC HISTORY: "Do you have any history of heart disease?" (e.g., heart attack, congestive heart failure)      Patient denies 8. LUNG HISTORY: "Do you have any history of lung disease?"  (e.g., pulmonary embolus, asthma, emphysema)     COPD, wife states mass in lung that wasn't cancer 9. PE RISK FACTORS: "Do you have a history of blood clots?" (or: recent major surgery, recent prolonged travel, bedridden)     No 10. OTHER SYMPTOMS: "Do you have any other symptoms?" (e.g., runny nose, wheezing, chest pain)       coughing 12. TRAVEL: "Have you traveled out of the country in the last month?" (e.g., travel history, exposures)       ----  Protocols used: Coughing Up Blood-A-AH

## 2023-05-02 NOTE — Assessment & Plan Note (Addendum)
 Left mid lung consolidation/mass -presumed pnemonia -in the setting of acute illness. CT cehst /PET scan Nov/DEC 2024 was not present. Treat with empiric antibiotics with close clinical and serial imaging. Chest on return in 2 weeks if not improving will need CT chest   Plan  Patient Instructions  Begin Levaquin  500mg  daily for 10 days  Continue on Breztri  2 puffs Twice daily, rinse after use.  Robitussin DM 2 tsp every 4-6 hr As needed   Combivent  inhaler As needed   Fluids and rest  Follow up in 2 weeks with Dr. Bertrum Brodie or Huie Ghuman NP and As needed   Please contact office for sooner follow up if symptoms do not improve or worsen or seek emergency care

## 2023-05-02 NOTE — Progress Notes (Signed)
 @Patient  ID: Cody Tate, male    DOB: May 24, 1950, 73 y.o.   MRN: [id number]  Chief Complaint  Patient presents with   Acute Visit    Referring provider: Benedetta Bradley, *  HPI: 73 year old male former smoker followed for COPD with emphysema, CT chest with right upper lobe density  TEST/EVENTS :  HRCT chest November 28, 2022 with thick-walled cavitary masslike consolidation in the right upper and right lower lobe with groundglass nodularity throughout the right.,  No evidence of interstitial lung disease.,  Emphysema  PET scan December 31, 2022 with residual platelike opacity in the right upper lobe suggesting postinfectious inflammatory scarring.  Prior cavitary lesion no longer visualized, no suspicious pulmonary nodules  PFT - January 20, 2013 that shows moderate obstruction with an FEV1 of 2.2 L / 75% with a ratio of 65.   05/02/2023 Acute OV : Hemoptysis , COPD  Patient presents for an acute office visit.  He was seen in the office 1 week ago for COPD exacerbation.  He had developed acute symptoms after mowing the grass with cough, congestion, wheezing and low-grade fever.  He was given doxycycline  x 5 days and a prednisone  burst.  Patient says he did have some improvement but continues to have ongoing symptoms of cough and congestion.  Patient says he has been coughing up pink-tinged mucus over the last 2 weeks and this morning he noticed that he had some dark red mixed in his mucus.  He coughed up several times this morning with bloody mucus.  No frank hemoptysis.  Has had some intermittent low-grade fever.  He says appetite is good.  No nausea vomiting diarrhea.  No recent travel.  No rash or joint swelling. Chest xray today shows a rounded left mid lung consolidation/mass   He has COPD with emphysema.  Remains on Breztri  twice daily.  Has Combivent  as needed for rescue inhaler.  Patient is not on any anticoagulation therapy.  Does not take aspirin  on a regular  basis.     Allergies  Allergen Reactions   Other Itching, Rash and Other (See Comments)    VICRYL Sutures   Tape Itching, Other (See Comments) and Rash    Occlusive tape and bandaids    Immunization History  Administered Date(s) Administered   DTP 06/01/2001   Fluad Quad(high Dose 65+) 09/16/2019   Influenza Split 10/16/2010, 10/08/2012, 10/24/2012, 10/20/2013   Influenza, High Dose Seasonal PF 10/22/2014, 09/07/2015   Influenza-Unspecified 10/03/2018, 11/02/2022   PFIZER(Purple Top)SARS-COV-2 Vaccination 02/13/2019, 03/10/2019, 10/05/2019, 04/19/2020   Pfizer Covid-19 Vaccine Bivalent Booster 14yrs & up 09/29/2020   Pneumococcal Conjugate-13 10/22/2014   Pneumococcal Polysaccharide-23 02/10/2015   Tdap 09/18/2011, 05/17/2012   Zoster Recombinant(Shingrix) 08/18/2018   Zoster, Live 08/04/2010    Past Medical History:  Diagnosis Date   Arthritis    Back pain    Radiates down both legs   BPH (benign prostatic hyperplasia)    Cancer (HCC) 2022   prostate cancer   COPD (chronic obstructive pulmonary disease) (HCC)    Dyspnea    Enlarged prostate    GERD (gastroesophageal reflux disease)    Glaucoma    H/O hiatal hernia    H/O wheezing    occ inhaler usage   Heart murmur    Hypertension    Lumbar foraminal stenosis    Macular degeneration    Meningioma (HCC) 2014   Treated with radiation   Neuromuscular disorder (HCC)    Neuropathy bilateral legs   PONV (postoperative  nausea and vomiting)    Pulmonary nodules    resolved on 2019 follow up CT   S/P insertion of spinal cord stimulator     Tobacco History: Social History   Tobacco Use  Smoking Status Former   Current packs/day: 0.00   Average packs/day: 1.5 packs/day for 35.0 years (52.5 ttl pk-yrs)   Types: Cigarettes   Start date: 05/03/1965   Quit date: 05/03/2000   Years since quitting: 23.0  Smokeless Tobacco Never  Tobacco Comments   quit alcohol  73   Counseling given: Not Answered Tobacco  comments: quit alcohol  73   Outpatient Medications Prior to Visit  Medication Sig Dispense Refill   Ascorbic Acid  (VITAMIN C ) 1000 MG tablet Take 1,000 mg by mouth daily.     aspirin  EC 81 MG tablet Take 1 tablet (81 mg total) by mouth daily. Swallow whole.     budeson-glycopyrrolate -formoterol  (BREZTRI  AEROSPHERE) 160-9-4.8 MCG/ACT AERO inhaler Inhale 2 puffs into the lungs in the morning and at bedtime.     budeson-glycopyrrolate -formoterol  (BREZTRI  AEROSPHERE) 160-9-4.8 MCG/ACT AERO inhaler Inhale 2 puffs into the lungs in the morning and at bedtime. 10.7 g 11   celecoxib (CELEBREX) 200 MG capsule TAKE 1 TO 2 CAPSULES BY MOUTH DAILY WITH FOOD for 90     chlorhexidine  (HIBICLENS ) 4 % external liquid Apply 1 Application topically daily as needed (boils).     Cholecalciferol  (VITAMIN D3) 50 MCG (2000 UT) capsule Take 4,000 Units by mouth daily.     diphenhydrAMINE  (BENADRYL  ALLERGY ) 25 MG tablet Take 25 mg by mouth at bedtime.     empagliflozin  (JARDIANCE ) 10 MG TABS tablet TAKE 1 TABLET(10 MG) BY MOUTH DAILY BEFORE BREAKFAST 90 tablet 2   finasteride  (PROSCAR ) 5 MG tablet Take 5 mg by mouth daily.     fluticasone  (FLONASE ) 50 MCG/ACT nasal spray Place 2 sprays into both nostrils daily.      gabapentin  (NEURONTIN ) 300 MG capsule Take 300 mg by mouth 4 (four) times daily.     Guaifenesin  (MUCINEX  MAXIMUM STRENGTH) 1200 MG TB12 Take 1,200 mg by mouth 2 (two) times daily.     ibuprofen (ADVIL,MOTRIN) 200 MG tablet Take 400 mg by mouth 4 (four) times daily as needed for moderate pain.     Ipratropium-Albuterol  (COMBIVENT  RESPIMAT) 20-100 MCG/ACT AERS respimat Inhale 1 puff into the lungs every 6 (six) hours as needed for wheezing or shortness of breath. 12 g 3   Lutein-Zeaxanthin 25-5 MG CAPS Take by mouth.     magnesium  hydroxide (MILK OF MAGNESIA) 400 MG/5ML suspension Take 15 mLs by mouth at bedtime.     metoprolol  succinate (TOPROL -XL) 100 MG 24 hr tablet Take 100 mg by mouth at bedtime. Take  with or immediately following a meal.     montelukast  (SINGULAIR ) 10 MG tablet TAKE 1 TABLET BY MOUTH AT  BEDTIME 90 tablet 3   Multiple Vitamin (MULTIVITAMIN WITH MINERALS) TABS tablet Take 1 tablet by mouth daily.     omeprazole  (PRILOSEC) 40 MG capsule Take 1 capsule (40 mg total) by mouth daily before breakfast. 90 capsule 3   Oxycodone  HCl 10 MG TABS Take 10 mg by mouth every 4 (four) hours as needed (pain).     oxymetazoline  (AFRIN) 0.05 % nasal spray Place 2 sprays into both nostrils 2 (two) times daily as needed for congestion.     Propylene Glycol, PF, (SYSTANE COMPLETE PF) 0.6 % SOLN Place 1 drop into both eyes 3 (three) times daily as needed (dry eyes).  pseudoephedrine  (SUDAFED) 30 MG tablet Take 60 mg by mouth 3 (three) times daily.     rosuvastatin  (CRESTOR ) 20 MG tablet Take 1 tablet (20 mg total) by mouth daily. 90 tablet 1   Simethicone  125 MG CAPS Take 250 mg by mouth 4 (four) times daily as needed (for gas).     sodium chloride  (OCEAN) 0.65 % SOLN nasal spray Place 1 spray into both nostrils as needed for congestion. As needed     Tamsulosin  HCl (FLOMAX ) 0.4 MG CAPS Take 0.4 mg by mouth 2 (two) times daily.     telmisartan (MICARDIS) 40 MG tablet Take 40 mg by mouth daily.     timolol (TIMOPTIC) 0.5 % ophthalmic solution 1 drop every morning.     furosemide  (LASIX ) 20 MG tablet Take 1 tablet (20 mg total) by mouth daily.     doxycycline  (VIBRA -TABS) 100 MG tablet Take doxycycline  100mg  po twice daily x 5 days; take after meals and avoid sunlight (Patient not taking: Reported on 05/02/2023) 10 tablet 0   predniSONE  (DELTASONE ) 10 MG tablet Please take prednisone  40 mg x1 day, then 30 mg x1 day, then 20 mg x1 day, then 10 mg x1 day, and then 5 mg x1 day and stop (Patient not taking: Reported on 05/02/2023) 11 tablet 0   TRELEGY ELLIPTA  200-62.5-25 MCG/ACT AEPB USE 1 INHALATION BY MOUTH ONCE  DAILY AT THE SAME TIME EACH DAY (Patient not taking: Reported on 05/02/2023) 180 each 3   No  facility-administered medications prior to visit.     Review of Systems:   Constitutional:   No  weight loss, night sweats,  Fevers, chills, +fatigue, or  lassitude.  HEENT:   No headaches,  Difficulty swallowing,  Tooth/dental problems, or  Sore throat,                No sneezing, itching, ear ache, nasal congestion, post nasal drip,   CV:  No chest pain,  Orthopnea, PND, swelling in lower extremities, anasarca, dizziness, palpitations, syncope.   GI  No heartburn, indigestion, abdominal pain, nausea, vomiting, diarrhea, change in bowel habits, loss of appetite, bloody stools.   Resp: .  No chest wall deformity  Skin: no rash or lesions.  GU: no dysuria, change in color of urine, no urgency or frequency.  No flank pain, no hematuria   MS:  No joint pain or swelling.  No decreased range of motion.  No back pain.    Physical Exam  BP (!) 150/70 (BP Location: Left Arm, Patient Position: Sitting, Cuff Size: Large)   Pulse 88   Temp 98.3 F (36.8 C) (Oral)   Ht 5' 6.5" (1.689 m)   Wt 234 lb (106.1 kg)   SpO2 93%   BMI 37.20 kg/m   GEN: A/Ox3; pleasant , NAD, well nourished    HEENT:  [place]/AT,  NOSE-clear, THROAT-clear, no lesions, no postnasal drip or exudate noted.   NECK:  Supple w/ fair ROM; no JVD; normal carotid impulses w/o bruits; no thyromegaly or nodules palpated; no lymphadenopathy.    RESP  Clear  P & A; w/o, wheezes/ rales/ or rhonchi. no accessory muscle use, no dullness to percussion  CARD:  RRR, no m/r/g, 1-2+ peripheral edema, pulses intact, no cyanosis or clubbing.  GI:   Soft & nt; nml bowel sounds; no organomegaly or masses detected.   Musco: Warm bil, no deformities or joint swelling noted.   Neuro: alert, no focal deficits noted.    Skin: Warm, no  lesions or rashes    Lab Results:  CBC    Component Value Date/Time   WBC 8.2 11/15/2022 1450   RBC 4.62 11/15/2022 1450   HGB 14.3 11/15/2022 1450   HCT 42.7 11/15/2022 1450   PLT 455.0 (H)  11/15/2022 1450   MCV 92.3 11/15/2022 1450   MCH 27.7 03/12/2022 1349   MCHC 33.5 11/15/2022 1450   RDW 13.6 11/15/2022 1450   LYMPHSABS 0.7 11/15/2022 1450   MONOABS 0.7 11/15/2022 1450   EOSABS 0.1 11/15/2022 1450   BASOSABS 0.1 11/15/2022 1450    BMET    Component Value Date/Time   NA 141 03/21/2023 1307   K 5.0 03/21/2023 1307   CL 99 03/21/2023 1307   CO2 24 03/21/2023 1307   GLUCOSE 116 (H) 03/21/2023 1307   GLUCOSE 113 (H) 03/12/2022 1349   BUN 18 03/21/2023 1307   BUN 22.8 08/05/2014 1203   CREATININE 0.89 03/21/2023 1307   CREATININE 1.0 08/05/2014 1203   CALCIUM  9.9 03/21/2023 1307   GFRNONAA >60 03/12/2022 1349   GFRAA 100 01/29/2020 1430    BNP No results found for: "BNP"  ProBNP    Component Value Date/Time   PROBNP 335 03/21/2023 1307   PROBNP 119.0 (H) 11/15/2022 1450    Imaging: DG Chest 2 View Result Date: 05/02/2023 CLINICAL DATA:  Hemoptysis EXAM: CHEST - 2 VIEW COMPARISON:  CT chest 11/28/2022 FINDINGS: Normal heart size and pulmonary vascularity. There is a new focal area of rounded consolidation or mass in the left mid lung, not present on prior study. This could represent a focal lesion, area of consolidation, or hematoma. CT correlation is suggested. The right lung appears clear today. Previous right lung lesion seen at prior CT is not identified radiographically today. No pleural effusion or pneumothorax. Mediastinal contours appear intact. Postoperative changes in the cervical and lumbar spine. Degenerative changes in the thoracic spine and shoulders. Spinal stimulator lead tips projecting over the midthoracic region. Calcification of the aorta. IMPRESSION: 1. New rounded mass or consolidation in the left mid lung. CT recommended for further evaluation. 2. Previous right lung lesion is not identified radiographically today. See prior CT report. Electronically Signed   By: Boyce Byes M.D.   On: 05/02/2023 16:48    albuterol  (PROVENTIL ) (2.5  MG/3ML) 0.083% nebulizer solution 2.5 mg     Date Action Dose Route User   05/02/2023 1636 Given 2.5 mg Nebulization Rosalva Comber, Annice Barthel, RN           No data to display          Lab Results  Component Value Date   NITRICOXIDE 5 11/15/2022        Assessment & Plan:   COPD (chronic obstructive pulmonary disease) (HCC) Slow to resolve COPD exacerbation-chest x-ray pending. Will begin Levaquin  x 10 days. Mucociliary clearance with liquid Mucinex  DM Robitussin DM Continue on triple therapy with Breztri  twice daily. Patient is aware that if blood-tinged mucus worsens or does not resolve -he will need to seek emergency room care. Albuterol  neb given in the office . No significant cough . No hemoptysis post treatment . Has perceived benefit .   Plan  Patient Instructions  Begin Levaquin  500mg  daily for 10 days  Continue on Breztri  2 puffs Twice daily, rinse after use.  Robitussin DM 2 tsp every 4-6 hr As needed   Combivent  inhaler As needed   Fluids and rest  Follow up in 2 weeks with Dr. Bertrum Brodie or French Jester NP  and As needed   Please contact office for sooner follow up if symptoms do not improve or worsen or seek emergency care          CAP (community acquired pneumonia) Left mid lung consolidation/mass -presumed pnemonia -in the setting of acute illness. CT cehst /PET scan Nov/DEC 2024 was not present. Treat with empiric antibiotics with close clinical and serial imaging. Chest on return in 2 weeks if not improving will need CT chest   Plan  Patient Instructions  Begin Levaquin  500mg  daily for 10 days  Continue on Breztri  2 puffs Twice daily, rinse after use.  Robitussin DM 2 tsp every 4-6 hr As needed   Combivent  inhaler As needed   Fluids and rest  Follow up in 2 weeks with Dr. Bertrum Brodie or Kolbie Lepkowski NP and As needed   Please contact office for sooner follow up if symptoms do not improve or worsen or seek emergency care        Hemoptysis Blood tinged  mucus in setting of COPD excerbation/CAP- treat with antibiotics . If not IMproving or worsens will need to be admitted/further evaluation.     Roena Clark, NP 05/02/2023

## 2023-05-02 NOTE — Assessment & Plan Note (Signed)
 Slow to resolve COPD exacerbation-chest x-ray pending. Will begin Levaquin  x 10 days. Mucociliary clearance with liquid Mucinex  DM Robitussin DM Continue on triple therapy with Breztri  twice daily. Patient is aware that if blood-tinged mucus worsens or does not resolve -he will need to seek emergency room care. Albuterol  neb given in the office . No significant cough . No hemoptysis post treatment . Has perceived benefit .   Plan  Patient Instructions  Begin Levaquin  500mg  daily for 10 days  Continue on Breztri  2 puffs Twice daily, rinse after use.  Robitussin DM 2 tsp every 4-6 hr As needed   Combivent  inhaler As needed   Fluids and rest  Follow up in 2 weeks with Dr. Bertrum Brodie or Nikkol Pai NP and As needed   Please contact office for sooner follow up if symptoms do not improve or worsen or seek emergency care

## 2023-05-09 ENCOUNTER — Encounter (HOSPITAL_BASED_OUTPATIENT_CLINIC_OR_DEPARTMENT_OTHER)

## 2023-05-15 DIAGNOSIS — J449 Chronic obstructive pulmonary disease, unspecified: Secondary | ICD-10-CM | POA: Diagnosis not present

## 2023-05-15 DIAGNOSIS — I700 Atherosclerosis of aorta: Secondary | ICD-10-CM | POA: Diagnosis not present

## 2023-05-15 DIAGNOSIS — I10 Essential (primary) hypertension: Secondary | ICD-10-CM | POA: Diagnosis not present

## 2023-05-16 ENCOUNTER — Ambulatory Visit: Payer: Self-pay | Admitting: Adult Health

## 2023-05-16 ENCOUNTER — Encounter: Payer: Self-pay | Admitting: Adult Health

## 2023-05-16 ENCOUNTER — Ambulatory Visit (INDEPENDENT_AMBULATORY_CARE_PROVIDER_SITE_OTHER)

## 2023-05-16 ENCOUNTER — Ambulatory Visit: Admitting: Adult Health

## 2023-05-16 VITALS — BP 140/70 | HR 86 | Ht 66.5 in | Wt 228.8 lb

## 2023-05-16 DIAGNOSIS — J189 Pneumonia, unspecified organism: Secondary | ICD-10-CM

## 2023-05-16 DIAGNOSIS — R042 Hemoptysis: Secondary | ICD-10-CM

## 2023-05-16 DIAGNOSIS — R911 Solitary pulmonary nodule: Secondary | ICD-10-CM

## 2023-05-16 DIAGNOSIS — C61 Malignant neoplasm of prostate: Secondary | ICD-10-CM | POA: Diagnosis not present

## 2023-05-16 DIAGNOSIS — J90 Pleural effusion, not elsewhere classified: Secondary | ICD-10-CM | POA: Diagnosis not present

## 2023-05-16 DIAGNOSIS — J441 Chronic obstructive pulmonary disease with (acute) exacerbation: Secondary | ICD-10-CM

## 2023-05-16 DIAGNOSIS — R918 Other nonspecific abnormal finding of lung field: Secondary | ICD-10-CM | POA: Diagnosis not present

## 2023-05-16 NOTE — Assessment & Plan Note (Signed)
 Left midlung consolidation/mass presumed pneumonia.  Clinically improved with 10-day course of Levaquin .  Chest x-ray today is pending.  Continue on current COPD maintenance regimen.  Pending x-ray results decide on further imaging. If no significant improvement would consider CT chest.  Will need serial follow-up until cleared  Plan  Patient Instructions  Chest xray today .  Continue on Breztri  2 puffs Twice daily, rinse after use.  Robitussin DM 2 tsp every 4-6 hr As needed   Combivent  inhaler As needed   Follow up in 6 weeks with Dr. Bertrum Brodie or Jamara Vary NP and As needed   Please contact office for sooner follow up if symptoms do not improve or worsen or seek emergency care

## 2023-05-16 NOTE — Assessment & Plan Note (Signed)
 Recent exacerbation with underlying pneumonia.  Clinically appears stable.  Continue on triple therapy maintenance regimen with Breztri  twice daily.  Plan  Patient Instructions  Chest xray today .  Continue on Breztri  2 puffs Twice daily, rinse after use.  Robitussin DM 2 tsp every 4-6 hr As needed   Combivent  inhaler As needed   Follow up in 6 weeks with Dr. Bertrum Brodie or Aylen Stradford NP and As needed   Please contact office for sooner follow up if symptoms do not improve or worsen or seek emergency care

## 2023-05-16 NOTE — Patient Instructions (Addendum)
 Chest xray today .  Continue on Breztri  2 puffs Twice daily, rinse after use.  Robitussin DM 2 tsp every 4-6 hr As needed   Combivent  inhaler As needed   Follow up in 6 weeks with Dr. Bertrum Brodie or Alenah Sarria NP and As needed   Please contact office for sooner follow up if symptoms do not improve or worsen or seek emergency care

## 2023-05-16 NOTE — Progress Notes (Signed)
 @Patient  ID: Cody Tate, male    DOB: 24-Oct-1950, 73 y.o.   MRN: [id number]  Chief Complaint  Patient presents with   Follow-up    Referring provider: Benedetta Bradley, *  HPI: 73 year old male former smoker followed for COPD with emphysema, abnormal CT chest with right upper lobe density (treated as a pneumonia with resolution on follow-up PET scan December 2024)   TEST/EVENTS :  HRCT chest November 28, 2022 with thick-walled cavitary masslike consolidation in the right upper and right lower lobe with groundglass nodularity throughout the right.,  No evidence of interstitial lung disease.,  Emphysema   PET scan December 31, 2022 with residual platelike opacity in the right upper lobe suggesting postinfectious inflammatory scarring.  Prior cavitary lesion no longer visualized, no suspicious pulmonary nodules   PFT - January 20, 2013 that shows moderate obstruction with an FEV1 of 2.2 L / 75% with a ratio of 65.   05/16/2023 Follow up: Hemoptysis and COPD  Patient presents for a 2-week follow-up.  Patient was seen last visit for slow to resolve COPD exacerbation.  He was initially started on doxycycline  but continued to have ongoing symptoms with blood-tinged mucus.  Chest x-ray showed left mid lung consolidation/mass.  Patient was started on Levaquin  500 mg for 10 days.  Since last visit patient is feeling better with decreased cough, congestion and pink-tinged mucus.  Patient says his energy level is some better as well.  He denies any frank hemoptysis, fever or chest pain.  Appetite is good. Remains on Breztri  twice daily.   Allergies  Allergen Reactions   Other Itching, Rash and Other (See Comments)    VICRYL Sutures   Tape Itching, Other (See Comments) and Rash    Occlusive tape and bandaids    Immunization History  Administered Date(s) Administered   DTP 06/01/2001   Fluad Quad(high Dose 65+) 09/16/2019   Influenza Split 10/16/2010, 10/08/2012, 10/24/2012,  10/20/2013   Influenza, High Dose Seasonal PF 10/22/2014, 09/07/2015   Influenza-Unspecified 10/03/2018, 11/02/2022   PFIZER(Purple Top)SARS-COV-2 Vaccination 02/13/2019, 03/10/2019, 10/05/2019, 04/19/2020   Pfizer Covid-19 Vaccine Bivalent Booster 61yrs & up 09/29/2020   Pneumococcal Conjugate-13 10/22/2014   Pneumococcal Polysaccharide-23 02/10/2015   Tdap 09/18/2011, 05/17/2012   Zoster Recombinant(Shingrix) 08/18/2018   Zoster, Live 08/04/2010    Past Medical History:  Diagnosis Date   Arthritis    Back pain    Radiates down both legs   BPH (benign prostatic hyperplasia)    Cancer (HCC) 2022   prostate cancer   COPD (chronic obstructive pulmonary disease) (HCC)    Dyspnea    Enlarged prostate    GERD (gastroesophageal reflux disease)    Glaucoma    H/O hiatal hernia    H/O wheezing    occ inhaler usage   Heart murmur    Hypertension    Lumbar foraminal stenosis    Macular degeneration    Meningioma (HCC) 2014   Treated with radiation   Neuromuscular disorder (HCC)    Neuropathy bilateral legs   PONV (postoperative nausea and vomiting)    Pulmonary nodules    resolved on 2019 follow up CT   S/P insertion of spinal cord stimulator     Tobacco History: Social History   Tobacco Use  Smoking Status Former   Current packs/day: 0.00   Average packs/day: 1.5 packs/day for 35.0 years (52.5 ttl pk-yrs)   Types: Cigarettes   Start date: 05/03/1965   Quit date: 05/03/2000   Years since quitting: 23.0  Smokeless Tobacco Never  Tobacco Comments   quit alcohol  73   Counseling given: Not Answered Tobacco comments: quit alcohol  73   Outpatient Medications Prior to Visit  Medication Sig Dispense Refill   Ascorbic Acid  (VITAMIN C ) 1000 MG tablet Take 1,000 mg by mouth daily.     aspirin  EC 81 MG tablet Take 1 tablet (81 mg total) by mouth daily. Swallow whole.     budeson-glycopyrrolate -formoterol  (BREZTRI  AEROSPHERE) 160-9-4.8 MCG/ACT AERO inhaler Inhale 2 puffs into  the lungs in the morning and at bedtime. 10.7 g 11   celecoxib (CELEBREX) 200 MG capsule TAKE 1 TO 2 CAPSULES BY MOUTH DAILY WITH FOOD for 90     chlorhexidine  (HIBICLENS ) 4 % external liquid Apply 1 Application topically daily as needed (boils).     Cholecalciferol  (VITAMIN D3) 50 MCG (2000 UT) capsule Take 4,000 Units by mouth daily.     diphenhydrAMINE  (BENADRYL  ALLERGY ) 25 MG tablet Take 25 mg by mouth at bedtime.     empagliflozin  (JARDIANCE ) 10 MG TABS tablet TAKE 1 TABLET(10 MG) BY MOUTH DAILY BEFORE BREAKFAST 90 tablet 2   finasteride  (PROSCAR ) 5 MG tablet Take 5 mg by mouth daily.     fluticasone  (FLONASE ) 50 MCG/ACT nasal spray Place 2 sprays into both nostrils daily.      gabapentin  (NEURONTIN ) 300 MG capsule Take 300 mg by mouth 4 (four) times daily.     Guaifenesin  (MUCINEX  MAXIMUM STRENGTH) 1200 MG TB12 Take 1,200 mg by mouth 2 (two) times daily.     ibuprofen (ADVIL,MOTRIN) 200 MG tablet Take 400 mg by mouth 4 (four) times daily as needed for moderate pain.     Ipratropium-Albuterol  (COMBIVENT  RESPIMAT) 20-100 MCG/ACT AERS respimat Inhale 1 puff into the lungs every 6 (six) hours as needed for wheezing or shortness of breath. 12 g 3   Lutein-Zeaxanthin 25-5 MG CAPS Take by mouth.     magnesium  hydroxide (MILK OF MAGNESIA) 400 MG/5ML suspension Take 15 mLs by mouth at bedtime.     metoprolol  succinate (TOPROL -XL) 100 MG 24 hr tablet Take 100 mg by mouth at bedtime. Take with or immediately following a meal.     montelukast  (SINGULAIR ) 10 MG tablet TAKE 1 TABLET BY MOUTH AT  BEDTIME 90 tablet 3   Multiple Vitamin (MULTIVITAMIN WITH MINERALS) TABS tablet Take 1 tablet by mouth daily.     omeprazole  (PRILOSEC) 40 MG capsule Take 1 capsule (40 mg total) by mouth daily before breakfast. 90 capsule 3   Oxycodone  HCl 10 MG TABS Take 10 mg by mouth every 4 (four) hours as needed (pain).     oxymetazoline  (AFRIN) 0.05 % nasal spray Place 2 sprays into both nostrils 2 (two) times daily as  needed for congestion.     Propylene Glycol, PF, (SYSTANE COMPLETE PF) 0.6 % SOLN Place 1 drop into both eyes 3 (three) times daily as needed (dry eyes).     pseudoephedrine  (SUDAFED) 30 MG tablet Take 60 mg by mouth 3 (three) times daily.     rosuvastatin  (CRESTOR ) 20 MG tablet Take 1 tablet (20 mg total) by mouth daily. 90 tablet 1   Simethicone  125 MG CAPS Take 250 mg by mouth 4 (four) times daily as needed (for gas).     sodium chloride  (OCEAN) 0.65 % SOLN nasal spray Place 1 spray into both nostrils as needed for congestion. As needed     Tamsulosin  HCl (FLOMAX ) 0.4 MG CAPS Take 0.4 mg by mouth 2 (two) times daily.  telmisartan (MICARDIS) 40 MG tablet Take 40 mg by mouth daily.     timolol (TIMOPTIC) 0.5 % ophthalmic solution 1 drop every morning.     furosemide  (LASIX ) 20 MG tablet Take 1 tablet (20 mg total) by mouth daily.     budeson-glycopyrrolate -formoterol  (BREZTRI  AEROSPHERE) 160-9-4.8 MCG/ACT AERO inhaler Inhale 2 puffs into the lungs in the morning and at bedtime. (Patient not taking: Reported on 05/16/2023)     levofloxacin  (LEVAQUIN ) 500 MG tablet Take 1 tablet (500 mg total) by mouth daily. (Patient not taking: Reported on 05/16/2023) 10 tablet 0   No facility-administered medications prior to visit.     Review of Systems:   Constitutional:   No  weight loss, night sweats,  Fevers, chills, fatigue, or  lassitude.  HEENT:   No headaches,  Difficulty swallowing,  Tooth/dental problems, or  Sore throat,                No sneezing, itching, ear ache, nasal congestion, post nasal drip,   CV:  No chest pain,  Orthopnea, PND, swelling in lower extremities, anasarca, dizziness, palpitations, syncope.   GI  No heartburn, indigestion, abdominal pain, nausea, vomiting, diarrhea, change in bowel habits, loss of appetite, bloody stools.   Resp: No shortness of breath with exertion or at rest.  No excess mucus, no productive cough,  No non-productive cough,  No coughing up of blood.   No change in color of mucus.  No wheezing.  No chest wall deformity  Skin: no rash or lesions.  GU: no dysuria, change in color of urine, no urgency or frequency.  No flank pain, no hematuria   MS:  No joint pain or swelling.  No decreased range of motion.  No back pain.    Physical Exam  BP (!) 140/70 (BP Location: Left Arm, Cuff Size: Large)   Pulse 86   Ht 5' 6.5" (1.689 m)   Wt 228 lb 12.8 oz (103.8 kg)   SpO2 91%   BMI 36.38 kg/m   GEN: A/Ox3; pleasant , NAD, well nourished    HEENT:  [place]/AT,  EACs-clear, TMs-wnl, NOSE-clear, THROAT-clear, no lesions, no postnasal drip or exudate noted.   NECK:  Supple w/ fair ROM; no JVD; normal carotid impulses w/o bruits; no thyromegaly or nodules palpated; no lymphadenopathy.    RESP  Clear  P & A; w/o, wheezes/ rales/ or rhonchi. no accessory muscle use, no dullness to percussion  CARD:  RRR, no m/r/g, no peripheral edema, pulses intact, no cyanosis or clubbing.  GI:   Soft & nt; nml bowel sounds; no organomegaly or masses detected.   Musco: Warm bil, no deformities or joint swelling noted.   Neuro: alert, no focal deficits noted.    Skin: Warm, no lesions or rashes    Lab Results:  CBC  No results found for: "BNP"  ProBNP   Imaging: DG Chest 2 View Result Date: 05/02/2023 CLINICAL DATA:  Hemoptysis EXAM: CHEST - 2 VIEW COMPARISON:  CT chest 11/28/2022 FINDINGS: Normal heart size and pulmonary vascularity. There is a new focal area of rounded consolidation or mass in the left mid lung, not present on prior study. This could represent a focal lesion, area of consolidation, or hematoma. CT correlation is suggested. The right lung appears clear today. Previous right lung lesion seen at prior CT is not identified radiographically today. No pleural effusion or pneumothorax. Mediastinal contours appear intact. Postoperative changes in the cervical and lumbar spine. Degenerative changes in the thoracic  spine and shoulders. Spinal  stimulator lead tips projecting over the midthoracic region. Calcification of the aorta. IMPRESSION: 1. New rounded mass or consolidation in the left mid lung. CT recommended for further evaluation. 2. Previous right lung lesion is not identified radiographically today. See prior CT report. Electronically Signed   By: Boyce Byes M.D.   On: 05/02/2023 16:48    albuterol  (PROVENTIL ) (2.5 MG/3ML) 0.083% nebulizer solution 2.5 mg     Date Action Dose Route User   05/02/2023 1636 Given 2.5 mg Nebulization Cody Tate, Cody Barthel, RN           No data to display          Lab Results  Component Value Date   NITRICOXIDE 5 11/15/2022        Assessment & Plan:   CAP (community acquired pneumonia) Left midlung consolidation/mass presumed pneumonia.  Clinically improved with 10-day course of Levaquin .  Chest x-ray today is pending.  Continue on current COPD maintenance regimen.  Pending x-ray results decide on further imaging. If no significant improvement would consider CT chest.  Will need serial follow-up until cleared  Plan  Patient Instructions  Chest xray today .  Continue on Breztri  2 puffs Twice daily, rinse after use.  Robitussin DM 2 tsp every 4-6 hr As needed   Combivent  inhaler As needed   Follow up in 6 weeks with Dr. Bertrum Brodie or Cassady Stanczak NP and As needed   Please contact office for sooner follow up if symptoms do not improve or worsen or seek emergency care       COPD (chronic obstructive pulmonary disease) (HCC) Recent exacerbation with underlying pneumonia.  Clinically appears stable.  Continue on triple therapy maintenance regimen with Breztri  twice daily.  Plan  Patient Instructions  Chest xray today .  Continue on Breztri  2 puffs Twice daily, rinse after use.  Robitussin DM 2 tsp every 4-6 hr As needed   Combivent  inhaler As needed   Follow up in 6 weeks with Dr. Bertrum Brodie or Beckett Maden NP and As needed   Please contact office for sooner follow up if  symptoms do not improve or worsen or seek emergency care         Roena Clark, NP 05/16/2023

## 2023-05-17 DIAGNOSIS — E7800 Pure hypercholesterolemia, unspecified: Secondary | ICD-10-CM | POA: Diagnosis not present

## 2023-05-17 DIAGNOSIS — Z79899 Other long term (current) drug therapy: Secondary | ICD-10-CM | POA: Diagnosis not present

## 2023-05-17 NOTE — Progress Notes (Signed)
Called and spoke with patient, advised of results/recommendations per Tammy Parrett NP.  He verbalized understanding.  Nothing further needed.

## 2023-05-18 LAB — HEPATIC FUNCTION PANEL
ALT: 14 IU/L (ref 0–44)
AST: 17 IU/L (ref 0–40)
Albumin: 3.6 g/dL — ABNORMAL LOW (ref 3.8–4.8)
Alkaline Phosphatase: 208 IU/L — ABNORMAL HIGH (ref 44–121)
Bilirubin Total: 1.2 mg/dL (ref 0.0–1.2)
Bilirubin, Direct: 0.47 mg/dL — ABNORMAL HIGH (ref 0.00–0.40)
Total Protein: 6.4 g/dL (ref 6.0–8.5)

## 2023-05-18 LAB — LIPID PANEL
Chol/HDL Ratio: 2.9 ratio (ref 0.0–5.0)
Cholesterol, Total: 130 mg/dL (ref 100–199)
HDL: 45 mg/dL (ref >39)
LDL Chol Calc (NIH): 71 mg/dL (ref 0–99)
Triglycerides: 66 mg/dL (ref 0–149)
VLDL Cholesterol Cal: 14 mg/dL (ref 5–40)

## 2023-05-20 ENCOUNTER — Ambulatory Visit: Payer: Self-pay | Admitting: Adult Health

## 2023-05-20 ENCOUNTER — Ambulatory Visit: Payer: Self-pay | Admitting: *Deleted

## 2023-05-20 NOTE — Telephone Encounter (Signed)
 Chief Complaint: productive cough Symptoms: fever, pink productive cough Frequency: x a few days Pertinent Negatives: Patient denies CP, severe SOB Disposition: [] ED /[] Urgent Care (no appt availability in office) / [x] Appointment(In office/virtual)/ []  [facility]/ [] Home Care/ [] Refused Recommended Disposition /[] [facility]/ []  Follow-up with PCP Additional Notes: Pt wife, June calling c/o productive pink cough x a few days. Additionally, June reported fever of 101 last night that cause hypoxia and pt "acting strange". June reports pt took his bedtime meds, that included ibuprofen and woke up today afebrile at baseline. June reports that pt is denying any SOB, CP. Scheduled patient at soonest available acute appt on 05/22/2023. Triager also reviewed fever management. Caregiver verbalized understanding and to call back/911 with worsening symptoms.      Reason for Disposition  [1] Known COPD or other severe lung disease (i.e., bronchiectasis, cystic fibrosis, lung surgery) AND [2] worsening symptoms (i.e., increased sputum purulence or amount, increased breathing difficulty  Answer Assessment - Initial Assessment Questions E2C2 Pulmonary Triage - Initial Assessment Questions "Chief Complaint (e.g., cough, sob, wheezing, fever, chills, sweat or additional symptoms) *Go to specific symptom protocol after initial questions. "Acting strange" but normal today, fever (101F), hypoxia (low 80s with fever), productive cough  "How long have symptoms been present?" X few days  Have you tested for COVID or Flu? Note: If not, ask patient if a home test can be taken. If so, instruct patient to call back for positive results. No, only has expired tests  MEDICINES:   "Have you used any OTC meds to help with symptoms?" Yes If yes, ask "What medications?" ibuprofen  "Have you used your inhalers/maintenance medication?" Yes If yes, "What medications?" Breztri  - 2 puffs twice  daily Comivent - PRN, occasionally uses  If inhaler, ask "How many puffs and how often?" Note: Review instructions on medication in the chart. See above  OXYGEN: "Do you wear supplemental oxygen?" No If yes, "How many liters are you supposed to use?" N/a  "Do you monitor your oxygen levels?" Yes If yes, "What is your reading (oxygen level) today?" 94%  "What is your usual oxygen saturation reading?"  (Note: Pulmonary O2 sats should be 90% or greater) Usually 98%    3. SPUTUM: "Describe the color of your sputum" (none, dry cough; clear, white, yellow, green)     Pink  4. HEMOPTYSIS: "Are you coughing up any blood?" If so ask: "How much?" (flecks, streaks, tablespoons, etc.)     Denies bright red blood 5. DIFFICULTY BREATHING: "Are you having difficulty breathing?" If Yes, ask: "How bad is it?" (e.g., mild, moderate, severe)    - MILD: No SOB at rest, mild SOB with walking, speaks normally in sentences, can lie down, no retractions, pulse < 100.    - MODERATE: SOB at rest, SOB with minimal exertion and prefers to sit, cannot lie down flat, speaks in phrases, mild retractions, audible wheezing, pulse 100-120.    - SEVERE: Very SOB at rest, speaks in single words, struggling to breathe, sitting hunched forward, retractions, pulse > 120      Wife denies 6. FEVER: "Do you have a fever?" If Yes, ask: "What is your temperature, how was it measured, and when did it start?"     Denies current fever 7. CARDIAC HISTORY: "Do you have any history of heart disease?" (e.g., heart attack, congestive heart failure)      denies 8. LUNG HISTORY: "Do you have any history of lung disease?"  (e.g., pulmonary  embolus, asthma, emphysema)     COPD, nodule 9. PE RISK FACTORS: "Do you have a history of blood clots?" (or: recent major surgery, recent prolonged travel, bedridden)     denies 10. OTHER SYMPTOMS: "Do you have any other symptoms?" (e.g., runny nose, wheezing, chest pain)        denies  Protocols used: Cough - Acute Productive-A-AH

## 2023-05-21 ENCOUNTER — Ambulatory Visit (INDEPENDENT_AMBULATORY_CARE_PROVIDER_SITE_OTHER): Attending: Cardiovascular Disease | Admitting: Pharmacist

## 2023-05-21 VITALS — BP 170/78 | HR 88

## 2023-05-21 DIAGNOSIS — A419 Sepsis, unspecified organism: Secondary | ICD-10-CM | POA: Diagnosis not present

## 2023-05-21 DIAGNOSIS — R000 Tachycardia, unspecified: Secondary | ICD-10-CM | POA: Diagnosis not present

## 2023-05-21 DIAGNOSIS — I10 Essential (primary) hypertension: Secondary | ICD-10-CM | POA: Insufficient documentation

## 2023-05-21 NOTE — Assessment & Plan Note (Signed)
 Assessment: Blood pressure significantly elevated in clinic today Patient is taking several medications that can increase blood pressure including pseudoephedrine  ibuprofen and Afrin I discussed the risks of these medications in detail with patient Encouraged him to limit caffeine intake  Plan: Stop ibuprofen, pseudoephedrine  and Afrin Talk to primary care about alternatives for breakthrough pain Continue metoprolol  succinate 100 mg daily, telmisartan 40 mg twice a day, furosemide  20 mg daily and Jardiance  10 mg daily Continue monitoring blood pressure at home Bring in blood pressure cuff and log to next visit Follow-up 6/6

## 2023-05-21 NOTE — Patient Instructions (Signed)
 Lounge doctor Saline rinse  STOP taking: Pseudoephedrine   Ibuprofen Afrin  Continue checking blood pressure at home. Please bring you list of reading and blood pressure to next visit.   Your blood pressure goal is < 130/57mmHg    Important lifestyle changes to control high blood pressure  Intervention  Effect on the BP   Weight loss Weight loss is one of the most effective lifestyle changes for controlling blood pressure. If you're overweight or obese, losing even a small amount of weight can help reduce blood pressure.    Blood pressure can decrease by 1 millimeter of mercury (mmHg) with each kilogram (about 2.2 pounds) of weight lost.   Exercise regularly As a general goal, aim for 30 minutes of moderate physical activity every day.    Regular physical activity can lower blood pressure by 5 - 8 mmHg.   Eat a healthy diet Eat a diet rich in whole grains, fruits, vegetables, lean meat, and low-fat dairy products. Limit processed foods, saturated fat, and sweets.    A heart-healthy diet can lower high blood pressure by 10 mmHg.   Reduce salt (sodium) in your diet Aim for 000mg  of sodium each day. Avoid deli meats, canned food, and frozen microwave meals which are high in sodium.     Limiting sodium can reduce blood pressure by 5 mmHg.   Limit alcohol  One drink equals 12 ounces of beer, 5 ounces of wine, or 1.5 ounces of 80-proof liquor.    Limiting alcohol  to < 1 drink a day for women or < 2 drinks a day for men can help lower blood pressure by about 4 mmHg.   To check your pressure at home you will need to:   Sit up in a chair, with feet flat on the floor and back supported. Do not cross your ankles or legs. Rest your left arm so that the cuff is about heart level. If the cuff goes on your upper arm, then just relax your arm on the table, arm of the chair, or your lap. If you have a wrist cuff, hold your wrist against your chest at heart level. Place the cuff  snugly around your arm, about 1 inch above the crease of your elbow. The cords should be inside the groove of your elbow.  Sit quietly, with the cuff in place, for about 5 minutes. Then press the power button to start a reading. Do not talk or move while the reading is taking place.  Record your readings on a sheet of paper. Although most cuffs have a memory, it is often easier to see a pattern developing when the numbers are all in front of you.  You can repeat the reading after 1-3 minutes if it is recommended.   Make sure your bladder is empty and you have not had caffeine or tobacco within the last 30 minutes   Always bring your blood pressure log with you to your appointments. If you have not brought your monitor in to be double checked for accuracy, please bring it to your next appointment.   You can find a list of validated (accurate) blood pressure cuffs at: validatebp.org

## 2023-05-21 NOTE — Progress Notes (Signed)
 Patient ID: Cody Tate                 DOB: [birth year]                      MRN: [id number]      HPI: Cody Tate is a 73 y.o. male patient of Dr. Stann Earnest referred by Leala Prince, PA to HTN clinic. PMH is significant for hypertension, COPD, hyperlipidemia, MI, prostate cancer, macular degeneration, emphysema, cirrhosis, sleep apnea, diastolic dysfunction.   Patient presents today to hypertension clinic companied by his wife.  Still having issues with coughing up some blood.  Sees pulmonology tomorrow.  Reports blood pressure being 160s either yesterday or today.  States it is generally 130-140 systolic.  Blood pressure machine came from primary care office.  Results are automatically sent to them.  Denies receiving phone calls or getting medications adjusted.  The phone number to the people who call is [phone]- Bridgette.  Upon review of patient's medication list patient is taking chronically pseudoephedrine , ibuprofen, Afrin and Celebrex.  We discussed the negative outcomes these medications can have not only blood pressure but on cardiovascular health and with ibuprofen the additional risk of bleeding, swelling and kidney injury.  Patient has chronic back pain.  Has had several back surgeries.  Takes oxycodone  every 6 hours which does help his pain.  He does not want to take any more oxycodone .  States that if he does a lot then he has breakthrough pain and he takes ibuprofen.  He also takes celecoxib daily.  Patient has chronic swelling.  About the same as previous visit.  BNP in March was fairly well-controlled.   Current HTN meds: Metoprolol  succinate 100 g daily, telmisartan 40 mg twice daily,  Furosemide  20 mg daily, Jardiance  10 mg daily Previously tried: Spironolactone  (hyperkalemia and AKI) BP goal: <130/80  Social History: no tobacco, no ETOH  Diet: couple of cups in AM of coffee, decaf tea  Exercise: Not reviewed today   Home BP readings:  Reports 130-140  systolic- that's all they remember  Wt Readings from Last 3 Encounters:  05/16/23 228 lb 12.8 oz (103.8 kg)  05/02/23 234 lb (106.1 kg)  04/23/23 235 lb (106.6 kg)   BP Readings from Last 3 Encounters:  05/21/23 (!) 170/78  05/16/23 (!) 140/70  05/02/23 (!) 150/70   Pulse Readings from Last 3 Encounters:  05/21/23 88  05/16/23 86  05/02/23 88    Renal function: CrCl cannot be calculated (Patient's most recent lab result is older than the maximum 21 days allowed.).  Past Medical History:  Diagnosis Date   Arthritis    Back pain    Radiates down both legs   BPH (benign prostatic hyperplasia)    Cancer (HCC) 2022   prostate cancer   COPD (chronic obstructive pulmonary disease) (HCC)    Dyspnea    Enlarged prostate    GERD (gastroesophageal reflux disease)    Glaucoma    H/O hiatal hernia    H/O wheezing    occ inhaler usage   Heart murmur    Hypertension    Lumbar foraminal stenosis    Macular degeneration    Meningioma (HCC) 2014   Treated with radiation   Neuromuscular disorder (HCC)    Neuropathy bilateral legs   PONV (postoperative nausea and vomiting)    Pulmonary nodules    resolved on 2019 follow up CT   S/P insertion of spinal cord stimulator  Current Outpatient Medications on File Prior to Visit  Medication Sig Dispense Refill   Ascorbic Acid  (VITAMIN C ) 1000 MG tablet Take 1,000 mg by mouth daily.     budeson-glycopyrrolate -formoterol  (BREZTRI  AEROSPHERE) 160-9-4.8 MCG/ACT AERO inhaler Inhale 2 puffs into the lungs in the morning and at bedtime. 10.7 g 11   celecoxib (CELEBREX) 200 MG capsule TAKE 1 TO 2 CAPSULES BY MOUTH DAILY WITH FOOD for 90     Cholecalciferol  (VITAMIN D3) 50 MCG (2000 UT) capsule Take 4,000 Units by mouth daily.     diphenhydrAMINE  (BENADRYL  ALLERGY ) 25 MG tablet Take 25 mg by mouth at bedtime.     empagliflozin  (JARDIANCE ) 10 MG TABS tablet TAKE 1 TABLET(10 MG) BY MOUTH DAILY BEFORE BREAKFAST 90 tablet 2   finasteride   (PROSCAR ) 5 MG tablet Take 5 mg by mouth daily.     fluticasone  (FLONASE ) 50 MCG/ACT nasal spray Place 2 sprays into both nostrils daily.      gabapentin  (NEURONTIN ) 300 MG capsule Take 300 mg by mouth 4 (four) times daily.     Guaifenesin  (MUCINEX  MAXIMUM STRENGTH) 1200 MG TB12 Take 1,200 mg by mouth 2 (two) times daily.     Ipratropium-Albuterol  (COMBIVENT  RESPIMAT) 20-100 MCG/ACT AERS respimat Inhale 1 puff into the lungs every 6 (six) hours as needed for wheezing or shortness of breath. 12 g 3   Lutein-Zeaxanthin 25-5 MG CAPS Take by mouth.     magnesium  hydroxide (MILK OF MAGNESIA) 400 MG/5ML suspension Take 15 mLs by mouth at bedtime.     metoprolol  succinate (TOPROL -XL) 100 MG 24 hr tablet Take 100 mg by mouth at bedtime. Take with or immediately following a meal.     montelukast  (SINGULAIR ) 10 MG tablet TAKE 1 TABLET BY MOUTH AT  BEDTIME 90 tablet 3   Multiple Vitamin (MULTIVITAMIN WITH MINERALS) TABS tablet Take 1 tablet by mouth daily.     omeprazole  (PRILOSEC) 40 MG capsule Take 1 capsule (40 mg total) by mouth daily before breakfast. 90 capsule 3   Oxycodone  HCl 10 MG TABS Take 10 mg by mouth every 4 (four) hours as needed (pain).     rosuvastatin  (CRESTOR ) 20 MG tablet Take 1 tablet (20 mg total) by mouth daily. 90 tablet 1   Simethicone  125 MG CAPS Take 250 mg by mouth 4 (four) times daily as needed (for gas).     sodium chloride  (OCEAN) 0.65 % SOLN nasal spray Place 1 spray into both nostrils as needed for congestion. As needed     Tamsulosin  HCl (FLOMAX ) 0.4 MG CAPS Take 0.4 mg by mouth 2 (two) times daily.     telmisartan (MICARDIS) 80 MG tablet Take 80 mg by mouth daily. Pt takes 40mg  BID     timolol (TIMOPTIC) 0.5 % ophthalmic solution 1 drop every morning.     aspirin  EC 81 MG tablet Take 1 tablet (81 mg total) by mouth daily. Swallow whole. (Patient not taking: Reported on 05/21/2023)     chlorhexidine  (HIBICLENS ) 4 % external liquid Apply 1 Application topically daily as  needed (boils). (Patient not taking: Reported on 05/21/2023)     furosemide  (LASIX ) 20 MG tablet Take 1 tablet (20 mg total) by mouth daily.     Propylene Glycol, PF, (SYSTANE COMPLETE PF) 0.6 % SOLN Place 1 drop into both eyes 3 (three) times daily as needed (dry eyes).     No current facility-administered medications on file prior to visit.    Allergies  Allergen Reactions   Other Itching, Rash  and Other (See Comments)    VICRYL Sutures   Tape Itching, Other (See Comments) and Rash    Occlusive tape and bandaids    Blood pressure (!) 170/78, pulse 88.   Assessment/Plan: HYPERTENSION CONTROL Vitals:   05/21/23 1411 05/21/23 1450  BP: (!) 170/78 (!) 170/78    The patient's blood pressure is elevated above target today.  In order to address the patient's elevated BP:       1. Hypertension -  Hypertension Assessment: Blood pressure significantly elevated in clinic today Patient is taking several medications that can increase blood pressure including pseudoephedrine  ibuprofen and Afrin I discussed the risks of these medications in detail with patient Encouraged him to limit caffeine intake  Plan: Stop ibuprofen, pseudoephedrine  and Afrin Talk to primary care about alternatives for breakthrough pain Continue metoprolol  succinate 100 mg daily, telmisartan 40 mg twice a day, furosemide  20 mg daily and Jardiance  10 mg daily Continue monitoring blood pressure at home Bring in blood pressure cuff and log to next visit Follow-up 6/6       Thank you  Benancio Bracket, Pharm.Monika Annas, CPP [facility] of [facility] [address], [place], [place] [address]  Phone: [phone]; Fax: [phone]

## 2023-05-21 NOTE — Telephone Encounter (Signed)
 FYI pt has an appt tomorrow with you

## 2023-05-22 ENCOUNTER — Other Ambulatory Visit: Payer: Self-pay

## 2023-05-22 ENCOUNTER — Inpatient Hospital Stay (HOSPITAL_COMMUNITY)

## 2023-05-22 ENCOUNTER — Inpatient Hospital Stay (HOSPITAL_COMMUNITY)
Admission: EM | Admit: 2023-05-22 | Discharge: 2023-05-28 | DRG: 871 | Disposition: A | Discharge status: Home Health | Attending: Internal Medicine | Admitting: Internal Medicine

## 2023-05-22 ENCOUNTER — Ambulatory Visit: Payer: Self-pay | Admitting: Internal Medicine

## 2023-05-22 ENCOUNTER — Emergency Department (HOSPITAL_COMMUNITY)

## 2023-05-22 ENCOUNTER — Ambulatory Visit: Admitting: Internal Medicine

## 2023-05-22 DIAGNOSIS — J929 Pleural plaque without asbestos: Secondary | ICD-10-CM | POA: Diagnosis not present

## 2023-05-22 DIAGNOSIS — R0689 Other abnormalities of breathing: Secondary | ICD-10-CM | POA: Diagnosis not present

## 2023-05-22 DIAGNOSIS — J439 Emphysema, unspecified: Secondary | ICD-10-CM | POA: Diagnosis present

## 2023-05-22 DIAGNOSIS — E871 Hypo-osmolality and hyponatremia: Secondary | ICD-10-CM | POA: Diagnosis not present

## 2023-05-22 DIAGNOSIS — E876 Hypokalemia: Secondary | ICD-10-CM | POA: Insufficient documentation

## 2023-05-22 DIAGNOSIS — R0902 Hypoxemia: Secondary | ICD-10-CM | POA: Diagnosis not present

## 2023-05-22 DIAGNOSIS — Z9079 Acquired absence of other genital organ(s): Secondary | ICD-10-CM

## 2023-05-22 DIAGNOSIS — Z4682 Encounter for fitting and adjustment of non-vascular catheter: Secondary | ICD-10-CM | POA: Diagnosis not present

## 2023-05-22 DIAGNOSIS — N400 Enlarged prostate without lower urinary tract symptoms: Secondary | ICD-10-CM | POA: Diagnosis present

## 2023-05-22 DIAGNOSIS — Z79899 Other long term (current) drug therapy: Secondary | ICD-10-CM

## 2023-05-22 DIAGNOSIS — Z888 Allergy status to other drugs, medicaments and biological substances status: Secondary | ICD-10-CM

## 2023-05-22 DIAGNOSIS — Z886 Allergy status to analgesic agent status: Secondary | ICD-10-CM

## 2023-05-22 DIAGNOSIS — J154 Pneumonia due to other streptococci: Secondary | ICD-10-CM | POA: Diagnosis present

## 2023-05-22 DIAGNOSIS — E1141 Type 2 diabetes mellitus with diabetic mononeuropathy: Secondary | ICD-10-CM | POA: Diagnosis present

## 2023-05-22 DIAGNOSIS — K219 Gastro-esophageal reflux disease without esophagitis: Secondary | ICD-10-CM | POA: Diagnosis present

## 2023-05-22 DIAGNOSIS — R918 Other nonspecific abnormal finding of lung field: Secondary | ICD-10-CM | POA: Diagnosis not present

## 2023-05-22 DIAGNOSIS — J9811 Atelectasis: Secondary | ICD-10-CM | POA: Diagnosis not present

## 2023-05-22 DIAGNOSIS — J948 Other specified pleural conditions: Secondary | ICD-10-CM | POA: Diagnosis not present

## 2023-05-22 DIAGNOSIS — A419 Sepsis, unspecified organism: Principal | ICD-10-CM | POA: Diagnosis present

## 2023-05-22 DIAGNOSIS — B954 Other streptococcus as the cause of diseases classified elsewhere: Secondary | ICD-10-CM | POA: Diagnosis present

## 2023-05-22 DIAGNOSIS — E7800 Pure hypercholesterolemia, unspecified: Secondary | ICD-10-CM

## 2023-05-22 DIAGNOSIS — I10 Essential (primary) hypertension: Secondary | ICD-10-CM | POA: Diagnosis not present

## 2023-05-22 DIAGNOSIS — J869 Pyothorax without fistula: Secondary | ICD-10-CM

## 2023-05-22 DIAGNOSIS — F1120 Opioid dependence, uncomplicated: Secondary | ICD-10-CM | POA: Diagnosis present

## 2023-05-22 DIAGNOSIS — I5032 Chronic diastolic (congestive) heart failure: Secondary | ICD-10-CM | POA: Diagnosis present

## 2023-05-22 DIAGNOSIS — R6520 Severe sepsis without septic shock: Secondary | ICD-10-CM | POA: Diagnosis not present

## 2023-05-22 DIAGNOSIS — M545 Low back pain, unspecified: Secondary | ICD-10-CM | POA: Diagnosis not present

## 2023-05-22 DIAGNOSIS — T402X5A Adverse effect of other opioids, initial encounter: Secondary | ICD-10-CM | POA: Diagnosis present

## 2023-05-22 DIAGNOSIS — M159 Polyosteoarthritis, unspecified: Secondary | ICD-10-CM | POA: Diagnosis present

## 2023-05-22 DIAGNOSIS — Z823 Family history of stroke: Secondary | ICD-10-CM

## 2023-05-22 DIAGNOSIS — I110 Hypertensive heart disease with heart failure: Secondary | ICD-10-CM | POA: Diagnosis present

## 2023-05-22 DIAGNOSIS — E66812 Obesity, class 2: Secondary | ICD-10-CM | POA: Diagnosis not present

## 2023-05-22 DIAGNOSIS — J9601 Acute respiratory failure with hypoxia: Secondary | ICD-10-CM | POA: Diagnosis not present

## 2023-05-22 DIAGNOSIS — E785 Hyperlipidemia, unspecified: Secondary | ICD-10-CM | POA: Diagnosis present

## 2023-05-22 DIAGNOSIS — J440 Chronic obstructive pulmonary disease with acute lower respiratory infection: Secondary | ICD-10-CM | POA: Diagnosis present

## 2023-05-22 DIAGNOSIS — J189 Pneumonia, unspecified organism: Secondary | ICD-10-CM | POA: Diagnosis not present

## 2023-05-22 DIAGNOSIS — K5903 Drug induced constipation: Secondary | ICD-10-CM | POA: Diagnosis not present

## 2023-05-22 DIAGNOSIS — M7989 Other specified soft tissue disorders: Secondary | ICD-10-CM | POA: Diagnosis present

## 2023-05-22 DIAGNOSIS — Z825 Family history of asthma and other chronic lower respiratory diseases: Secondary | ICD-10-CM

## 2023-05-22 DIAGNOSIS — Z7982 Long term (current) use of aspirin: Secondary | ICD-10-CM

## 2023-05-22 DIAGNOSIS — Z8249 Family history of ischemic heart disease and other diseases of the circulatory system: Secondary | ICD-10-CM | POA: Diagnosis not present

## 2023-05-22 DIAGNOSIS — J90 Pleural effusion, not elsewhere classified: Secondary | ICD-10-CM | POA: Diagnosis not present

## 2023-05-22 DIAGNOSIS — I2510 Atherosclerotic heart disease of native coronary artery without angina pectoris: Secondary | ICD-10-CM | POA: Diagnosis present

## 2023-05-22 DIAGNOSIS — Z6836 Body mass index (BMI) 36.0-36.9, adult: Secondary | ICD-10-CM

## 2023-05-22 DIAGNOSIS — Z8546 Personal history of malignant neoplasm of prostate: Secondary | ICD-10-CM

## 2023-05-22 DIAGNOSIS — R000 Tachycardia, unspecified: Secondary | ICD-10-CM | POA: Diagnosis not present

## 2023-05-22 DIAGNOSIS — Z91048 Other nonmedicinal substance allergy status: Secondary | ICD-10-CM

## 2023-05-22 DIAGNOSIS — J151 Pneumonia due to Pseudomonas: Secondary | ICD-10-CM | POA: Diagnosis not present

## 2023-05-22 DIAGNOSIS — Z7984 Long term (current) use of oral hypoglycemic drugs: Secondary | ICD-10-CM | POA: Diagnosis not present

## 2023-05-22 DIAGNOSIS — R9389 Abnormal findings on diagnostic imaging of other specified body structures: Secondary | ICD-10-CM | POA: Diagnosis not present

## 2023-05-22 DIAGNOSIS — M549 Dorsalgia, unspecified: Secondary | ICD-10-CM | POA: Diagnosis not present

## 2023-05-22 DIAGNOSIS — J918 Pleural effusion in other conditions classified elsewhere: Secondary | ICD-10-CM | POA: Diagnosis not present

## 2023-05-22 DIAGNOSIS — Z87891 Personal history of nicotine dependence: Secondary | ICD-10-CM

## 2023-05-22 DIAGNOSIS — J939 Pneumothorax, unspecified: Secondary | ICD-10-CM | POA: Diagnosis not present

## 2023-05-22 DIAGNOSIS — Z923 Personal history of irradiation: Secondary | ICD-10-CM

## 2023-05-22 LAB — PROTIME-INR
INR: 1.2 (ref 0.8–1.2)
Prothrombin Time: 15.7 s — ABNORMAL HIGH (ref 11.4–15.2)

## 2023-05-22 LAB — CBC WITH DIFFERENTIAL/PLATELET
Abs Immature Granulocytes: 0.92 10*3/uL — ABNORMAL HIGH (ref 0.00–0.07)
Basophils Absolute: 0.2 10*3/uL — ABNORMAL HIGH (ref 0.0–0.1)
Basophils Relative: 1 %
Eosinophils Absolute: 0 10*3/uL (ref 0.0–0.5)
Eosinophils Relative: 0 %
HCT: 48 % (ref 39.0–52.0)
Hemoglobin: 15.9 g/dL (ref 13.0–17.0)
Immature Granulocytes: 3 %
Lymphocytes Relative: 1 %
Lymphs Abs: 0.4 10*3/uL — ABNORMAL LOW (ref 0.7–4.0)
MCH: 29.9 pg (ref 26.0–34.0)
MCHC: 33.1 g/dL (ref 30.0–36.0)
MCV: 90.2 fL (ref 80.0–100.0)
Monocytes Absolute: 2.7 10*3/uL — ABNORMAL HIGH (ref 0.1–1.0)
Monocytes Relative: 9 %
Neutro Abs: 26.2 10*3/uL — ABNORMAL HIGH (ref 1.7–7.7)
Neutrophils Relative %: 86 %
Platelets: 451 10*3/uL — ABNORMAL HIGH (ref 150–400)
RBC: 5.32 MIL/uL (ref 4.22–5.81)
RDW: 13.2 % (ref 11.5–15.5)
WBC Morphology: INCREASED
WBC: 30.4 10*3/uL — ABNORMAL HIGH (ref 4.0–10.5)
nRBC: 0 % (ref 0.0–0.2)

## 2023-05-22 LAB — URINALYSIS, W/ REFLEX TO CULTURE (INFECTION SUSPECTED)
Bacteria, UA: NONE SEEN
Bilirubin Urine: NEGATIVE
Glucose, UA: >=500 mg/dL — AB
Hgb urine dipstick: NEGATIVE
Ketones, ur: NEGATIVE mg/dL
Leukocytes,Ua: NEGATIVE
Nitrite: NEGATIVE
Protein, ur: 100 mg/dL — AB
Specific Gravity, Urine: 1.027 (ref 1.005–1.030)
pH: 5 (ref 5.0–8.0)

## 2023-05-22 LAB — COMPREHENSIVE METABOLIC PANEL WITH GFR
ALT: 36 U/L (ref 0–44)
AST: 37 U/L (ref 15–41)
Albumin: 2.4 g/dL — ABNORMAL LOW (ref 3.5–5.0)
Alkaline Phosphatase: 178 U/L — ABNORMAL HIGH (ref 38–126)
Anion gap: 13 (ref 5–15)
BUN: 16 mg/dL (ref 8–23)
CO2: 23 mmol/L (ref 22–32)
Calcium: 8.5 mg/dL — ABNORMAL LOW (ref 8.9–10.3)
Chloride: 99 mmol/L (ref 98–111)
Creatinine, Ser: 1.15 mg/dL (ref 0.61–1.24)
GFR, Estimated: >60 mL/min (ref >60)
Glucose, Bld: 108 mg/dL — ABNORMAL HIGH (ref 70–99)
Potassium: 3.2 mmol/L — ABNORMAL LOW (ref 3.5–5.1)
Sodium: 135 mmol/L (ref 135–145)
Total Bilirubin: 1.6 mg/dL — ABNORMAL HIGH (ref 0.0–1.2)
Total Protein: 6.1 g/dL — ABNORMAL LOW (ref 6.5–8.1)

## 2023-05-22 LAB — LACTIC ACID, PLASMA
Lactic Acid, Venous: 3.4 mmol/L (ref 0.5–1.9)
Lactic Acid, Venous: 3.6 mmol/L (ref 0.5–1.9)

## 2023-05-22 LAB — LACTATE DEHYDROGENASE, PLEURAL OR PERITONEAL FLUID: LD, Fluid: 1017 U/L — ABNORMAL HIGH (ref 3–23)

## 2023-05-22 LAB — BODY FLUID CELL COUNT WITH DIFFERENTIAL
Eos, Fluid: 4 %
Lymphs, Fluid: 3 %
Monocyte-Macrophage-Serous Fluid: 30 % — ABNORMAL LOW (ref 50–90)
Neutrophil Count, Fluid: 63 % — ABNORMAL HIGH (ref 0–25)
Total Nucleated Cell Count, Fluid: >10000 uL — ABNORMAL HIGH (ref 0–1000)

## 2023-05-22 LAB — I-STAT CG4 LACTIC ACID, ED: Lactic Acid, Venous: 3 mmol/L (ref 0.5–1.9)

## 2023-05-22 LAB — PROTEIN, PLEURAL OR PERITONEAL FLUID: Total protein, fluid: 3.9 g/dL

## 2023-05-22 LAB — GLUCOSE, CAPILLARY
Glucose-Capillary: 119 mg/dL — ABNORMAL HIGH (ref 70–99)
Glucose-Capillary: 134 mg/dL — ABNORMAL HIGH (ref 70–99)

## 2023-05-22 LAB — TROPONIN I (HIGH SENSITIVITY)
Troponin I (High Sensitivity): 33 ng/L — ABNORMAL HIGH (ref <18)
Troponin I (High Sensitivity): 25 ng/L — ABNORMAL HIGH (ref <18)

## 2023-05-22 LAB — GLUCOSE, PLEURAL OR PERITONEAL FLUID: Glucose, Fluid: <20 mg/dL

## 2023-05-22 LAB — PROCALCITONIN: Procalcitonin: 8.71 ng/mL

## 2023-05-22 LAB — BRAIN NATRIURETIC PEPTIDE: B Natriuretic Peptide: 165.8 pg/mL — ABNORMAL HIGH (ref 0.0–100.0)

## 2023-05-22 LAB — MRSA NEXT GEN BY PCR, NASAL: MRSA by PCR Next Gen: NOT DETECTED

## 2023-05-22 MED ORDER — ONDANSETRON HCL 4 MG/2ML IJ SOLN: 4.0000 mg | Freq: Four times a day (QID) | INTRAMUSCULAR | Status: DC | PRN

## 2023-05-22 MED ORDER — OXYCODONE HCL 5 MG PO TABS
10.0000 mg | ORAL_TABLET | Freq: Once | ORAL | Status: AC
  Administered 2023-05-22: 10 mg via ORAL
  Filled 2023-05-22: qty 2

## 2023-05-22 MED ORDER — INSULIN ASPART 100 UNIT/ML IJ SOLN: 0.0000 [IU] | Freq: Every day | INTRAMUSCULAR | Status: DC

## 2023-05-22 MED ORDER — ENOXAPARIN SODIUM 40 MG/0.4ML IJ SOSY
40.0000 mg | PREFILLED_SYRINGE | INTRAMUSCULAR | Status: DC
  Administered 2023-05-22 – 2023-05-27 (×6): 40 mg via SUBCUTANEOUS
  Filled 2023-05-22 (×6): qty 0.4

## 2023-05-22 MED ORDER — PIPERACILLIN-TAZOBACTAM 3.375 G IVPB 30 MIN
3.3750 g | Freq: Once | INTRAVENOUS | Status: AC
  Administered 2023-05-22: 3.375 g via INTRAVENOUS
  Filled 2023-05-22: qty 50

## 2023-05-22 MED ORDER — OXYCODONE HCL 5 MG PO TABS
10.0000 mg | ORAL_TABLET | ORAL | Status: DC | PRN
  Administered 2023-05-22 – 2023-05-23 (×5): 10 mg via ORAL
  Filled 2023-05-22 (×5): qty 2

## 2023-05-22 MED ORDER — TRAZODONE HCL 50 MG PO TABS
25.0000 mg | ORAL_TABLET | Freq: Every evening | ORAL | Status: DC | PRN
  Administered 2023-05-22 – 2023-05-27 (×4): 25 mg via ORAL
  Filled 2023-05-22 (×4): qty 1

## 2023-05-22 MED ORDER — BUDESON-GLYCOPYRROL-FORMOTEROL 160-9-4.8 MCG/ACT IN AERO
2.0000 | INHALATION_SPRAY | Freq: Two times a day (BID) | RESPIRATORY_TRACT | Status: DC
  Administered 2023-05-22 – 2023-05-28 (×11): 2 via RESPIRATORY_TRACT
  Filled 2023-05-22: qty 5.9

## 2023-05-22 MED ORDER — ACETAMINOPHEN 325 MG PO TABS: 650.0000 mg | ORAL_TABLET | Freq: Four times a day (QID) | ORAL | Status: DC | PRN

## 2023-05-22 MED ORDER — SODIUM CHLORIDE 0.9% FLUSH
10.0000 mL | Freq: Three times a day (TID) | INTRAVENOUS | Status: DC
  Administered 2023-05-22 – 2023-05-28 (×9): 10 mL via INTRAPLEURAL

## 2023-05-22 MED ORDER — VANCOMYCIN HCL IN DEXTROSE 1-5 GM/200ML-% IV SOLN
1000.0000 mg | INTRAVENOUS | Status: AC
  Administered 2023-05-22: 1000 mg via INTRAVENOUS
  Filled 2023-05-22: qty 200

## 2023-05-22 MED ORDER — IPRATROPIUM-ALBUTEROL 0.5-2.5 (3) MG/3ML IN SOLN: 3.0000 mL | Freq: Four times a day (QID) | RESPIRATORY_TRACT | Status: DC | PRN

## 2023-05-22 MED ORDER — ACETAMINOPHEN 650 MG RE SUPP: 650.0000 mg | Freq: Four times a day (QID) | RECTAL | Status: DC | PRN

## 2023-05-22 MED ORDER — VANCOMYCIN HCL IN DEXTROSE 1-5 GM/200ML-% IV SOLN
1000.0000 mg | Freq: Once | INTRAVENOUS | Status: AC
  Administered 2023-05-22: 1000 mg via INTRAVENOUS
  Filled 2023-05-22: qty 200

## 2023-05-22 MED ORDER — SODIUM CHLORIDE (PF) 0.9 % IJ SOLN
10.0000 mg | Freq: Once | INTRAMUSCULAR | Status: AC
  Administered 2023-05-22: 10 mg via INTRAPLEURAL
  Filled 2023-05-22: qty 10

## 2023-05-22 MED ORDER — INSULIN ASPART 100 UNIT/ML IJ SOLN
0.0000 [IU] | Freq: Three times a day (TID) | INTRAMUSCULAR | Status: DC
  Administered 2023-05-23 – 2023-05-24 (×2): 2 [IU] via SUBCUTANEOUS
  Administered 2023-05-25: 3 [IU] via SUBCUTANEOUS
  Administered 2023-05-25 – 2023-05-27 (×3): 2 [IU] via SUBCUTANEOUS
  Administered 2023-05-28: 3 [IU] via SUBCUTANEOUS

## 2023-05-22 MED ORDER — STERILE WATER FOR INJECTION IJ SOLN
5.0000 mg | Freq: Once | RESPIRATORY_TRACT | Status: AC
  Administered 2023-05-22: 5 mg via INTRAPLEURAL
  Filled 2023-05-22: qty 5

## 2023-05-22 MED ORDER — PIPERACILLIN-TAZOBACTAM 3.375 G IVPB
3.3750 g | Freq: Three times a day (TID) | INTRAVENOUS | Status: DC
  Administered 2023-05-22 – 2023-05-28 (×18): 3.375 g via INTRAVENOUS
  Filled 2023-05-22 (×18): qty 50

## 2023-05-22 MED ORDER — MONTELUKAST SODIUM 10 MG PO TABS
10.0000 mg | ORAL_TABLET | Freq: Every day | ORAL | Status: DC
  Administered 2023-05-22 – 2023-05-27 (×6): 10 mg via ORAL
  Filled 2023-05-22 (×6): qty 1

## 2023-05-22 MED ORDER — LACTATED RINGERS IV BOLUS
2000.0000 mL | Freq: Once | INTRAVENOUS | Status: AC
  Administered 2023-05-22: 2000 mL via INTRAVENOUS

## 2023-05-22 MED ORDER — VANCOMYCIN HCL 1.25 G IV SOLR
1250.0000 mg | INTRAVENOUS | Status: DC
  Administered 2023-05-23: 1250 mg via INTRAVENOUS
  Filled 2023-05-22 (×2): qty 25

## 2023-05-22 MED ORDER — ONDANSETRON HCL 4 MG PO TABS: 4.0000 mg | ORAL_TABLET | Freq: Four times a day (QID) | ORAL | Status: DC | PRN

## 2023-05-22 MED ORDER — SODIUM CHLORIDE 0.9 % IV SOLN: 2.0000 g | Freq: Three times a day (TID) | INTRAVENOUS | Status: DC

## 2023-05-22 MED ORDER — FLUTICASONE PROPIONATE 50 MCG/ACT NA SUSP
1.0000 | Freq: Every day | NASAL | Status: DC
  Administered 2023-05-22 – 2023-05-28 (×6): 1 via NASAL
  Filled 2023-05-22: qty 16

## 2023-05-22 NOTE — ED Notes (Signed)
 RN s/w Valinda Gault and informed her patient is about to be transported to [facility]

## 2023-05-22 NOTE — Telephone Encounter (Signed)
 Copied from CRM [id number]. Topic: Clinical - Red Word Triage >> May 22, 2023  8:21 AM Crist Dominion wrote: Red Word that prompted transfer to Nurse Triage: Worsening cough, spitting up blood. Wife (June) states patient not acting "rightSocial research officer, government Complaint: Cough/"Not acting right" Symptoms: coughing up "pink stuff", back pain Frequency: since last night and morning Pertinent Negatives: Patient's wife denies obvious respiratory distress Disposition: [x] ED /[] Urgent Care (no appt availability in office) / [] Appointment(In office/virtual)/ []  [facility]/ [] Home Care/ [] Refused Recommended Disposition /[] [place]/ []  Follow-up with PCP Additional Notes: Patient's wife called and advised that last night her husband started coughing up pink stuff. She states he told her last night he had a lot of soreness in his back.  She states that he is sitting on the bed rocking back and forth.  She keeps asking him if he is okay and he just  She states he appears to be breathing normally and he is still coughing some. She advised that last night his temperature it was 101. She states that the patient has times that he breaks out and sweats. She states he still feels very warm. Patient's wife states that he just isn't acting right and she was going to call 911 before calling and speaking with Triage. Patient did say that his back is hurting and she states that he has had back fusions. This RN called 911 and remained on the phone with the patient.  Patient's wife states that the patient was at his baseline normal last night around 10pm. She states that after a coughing spell he started to be different. This RN stayed on the phone and assisted patient's wife with relaying information to the ambulance crew when they arrived at her home. EMS stated the patient's O2 sat was 92%%, heart rate was in the 150s and wife advised EMS said the patient was "burning up" when they checked his  temperature. As the EMS personnel was getting patient ready for transport to the Emergency Room, this RN advised the patient to go ahead and call the family member she was going to call.  She is advised that if she needs anything else to give us  a call.    Reason for Disposition  Sounds like a life-threatening emergency to the triager  Answer Assessment - Initial Assessment Questions 1. TEMPERATURE: "What is the most recent temperature?"  "How was it measured?"      101.8 this morning 2. ONSET: "When did the fever start?"      Last night 100.9 3. CHILLS: "Do you have chills?" If yes: "How bad are they?"  (e.g., none, mild, moderate, severe)   - NONE: no chills   - MILD: feeling cold   - MODERATE: feeling very cold, some shivering (feels better under a thick blanket)   - SEVERE: feeling extremely cold with shaking chills (general body shaking, rigors; even under a thick blanket)      ----- 4. OTHER SYMPTOMS: "Do you have any other symptoms besides the fever?"  (e.g., abdomen pain, cough, diarrhea, earache, headache, sore throat, urination pain)     Coughing up "pink stuff"  "not acting right" 5. CAUSE: If there are no symptoms, ask: "What do you think is causing the fever?"      ---- 6. CONTACTS: "Does anyone else in the family have an infection?"     ----- 7. TREATMENT: "What have you done so far to treat this fever?" (e.g., medications)     ----  8. IMMUNOCOMPROMISE: "Do you have of the following: diabetes, HIV positive, splenectomy, cancer chemotherapy, chronic steroid treatment, transplant patient, etc."     ----  Protocols used: Fever-A-AH

## 2023-05-22 NOTE — Progress Notes (Signed)
 Pharmacy Antibiotic Note  Cody Tate is a 73 y.o. male admitted on 05/22/2023 with sepsis.  Pharmacy has been consulted for vanc dosing.  Plan: Vanc 1g x 1 already given. Give another 1g dose to = 2g total for loading dose. Then start vancomycin  1250mg  IV q24 - goal AUC 400-550 Cefepime 2g IV q8 per current renal function     Temp (24hrs), Avg:98.3 F (36.8 C), Min:98.3 F (36.8 C), Max:98.3 F (36.8 C)  Recent Labs  Lab 05/22/23 1005 05/22/23 1018  WBC 30.4*  --   CREATININE 1.15  --   LATICACIDVEN  --  3.0*    Estimated Creatinine Clearance: 65.1 mL/min (by C-G formula based on SCr of 1.15 mg/dL).    Allergies  Allergen Reactions   Other Itching, Rash and Other (See Comments)    VICRYL Sutures   Tape Itching, Other (See Comments) and Rash    Occlusive tape and bandaids    Thank you for allowing pharmacy to be a part of this patient's care.  Bernett Brill 05/22/2023 12:50 PM

## 2023-05-22 NOTE — Progress Notes (Signed)
 A consult was received from an ED physician for vancomycin  and Zosyn per pharmacy dosing (for an indication other than meningitis). The patient's profile has been reviewed for ht/wt/allergies/indication/available labs. A one time order has been placed for the above antibiotics.  Further antibiotics/pharmacy consults should be ordered by admitting physician if indicated.                       Tera Fellows, PharmD, BCPS [phone] 05/22/2023, 10:29 AM

## 2023-05-22 NOTE — Sepsis Progress Note (Signed)
 eLink is following this Code Sepsis.

## 2023-05-22 NOTE — Procedures (Signed)
 Pleural Fibrinolytic Administration Procedure Note  Cody Tate  [id number]  [birth year]  Date:05/22/23  Time:6:30 PM   Provider Performing:Khing Belcher Tita Form Felipe Horton   Procedure: Pleural Fibrinolysis Initial day [phone])  Indication(s) Fibrinolysis of complicated pleural effusion  Consent Risks of the procedure as well as the alternatives and risks of each were explained to the patient and/or caregiver.  Consent for the procedure was obtained.   Anesthesia None   Time Out Verified patient identification, verified procedure, site/side was marked, verified correct patient position, special equipment/implants available, medications/allergies/relevant history reviewed, required imaging and test results available.   Sterile Technique Hand hygiene, gloves   Procedure Description Existing pleural catheter was cleaned and accessed in sterile manner.  10mg  of tPA in 30cc of saline and 5mg  of dornase in 30cc of sterile water  were injected into pleural space using existing pleural catheter.  Catheter will be clamped for 1 hour and then placed back to suction.   Complications/Tolerance None; patient tolerated the procedure well.  EBL None   Specimen(s) None

## 2023-05-22 NOTE — ED Provider Notes (Signed)
 [place] EMERGENCY DEPARTMENT AT [facility] Provider Note   CSN: [id number] Arrival date & time: 05/22/23  0932     History  Chief Complaint  Patient presents with   Code Sepsis    Cody Tate is a 73 y.o. male presenting to Emergency Department with fevers and weakness.  Patient's family members report he has been more confused for the past 2 days.  Patient reports he has felt more short of breath and has been coughing, pink frothy sputum as well.  He is diaphoretic and sweating today.  He was seen by heart care yesterday.  He is noted have a history of COPD, hyperlipidemia, MI, prostate cancer, emphysema, sleep apnea, hypertension.  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  Patient completed a 10-day course of Levaquin  recently (starting May 1st) for potential pneumonia of the right middle lobe.  HPI     Home Medications Prior to Admission medications   Medication Sig Start Date End Date Taking? Authorizing Provider  Ascorbic Acid  (VITAMIN C ) 1000 MG tablet Take 1,000 mg by mouth daily. 12/31/18   [provider]  aspirin  EC 81 MG tablet Take 1 tablet (81 mg total) by mouth daily. Swallow whole. 03/21/23   Williams, Evan, PA-C  budeson-glycopyrrolate -formoterol  (BREZTRI  AEROSPHERE) 160-9-4.8 MCG/ACT AERO inhaler Inhale 2 puffs into the lungs in the morning and at bedtime. 04/23/23   Maire Scot, MD  celecoxib (CELEBREX) 200 MG capsule TAKE 1 TO 2 CAPSULES BY MOUTH DAILY WITH FOOD for 90    [provider]  chlorhexidine  (HIBICLENS ) 4 % external liquid Apply 1 Application topically daily as needed (boils).    [provider]  Cholecalciferol  (VITAMIN D3) 50 MCG (2000 UT) capsule Take 4,000 Units by mouth daily. 12/22/18   [provider]  diphenhydrAMINE  (BENADRYL   ALLERGY ) 25 MG tablet Take 25 mg by mouth at bedtime.    [provider]  empagliflozin  (JARDIANCE ) 10 MG TABS tablet TAKE 1 TABLET(10 MG) BY MOUTH DAILY BEFORE BREAKFAST 03/12/23   Williams, Evan, PA-C  finasteride  (PROSCAR ) 5 MG tablet Take 5 mg by mouth daily. 05/13/20   [provider]  fluticasone  (FLONASE ) 50 MCG/ACT nasal spray Place 2 sprays into both nostrils daily.     [provider]  furosemide  (LASIX ) 20 MG tablet Take 1 tablet (20 mg total) by mouth daily. 11/19/22 03/21/23  Leala Prince, PA-C  gabapentin  (NEURONTIN ) 300 MG capsule Take 300 mg by mouth 4 (four) times daily.    [provider]  Guaifenesin  (MUCINEX  MAXIMUM STRENGTH) 1200 MG TB12 Take 1,200 mg by mouth 2 (two) times daily.    [provider]  Ipratropium-Albuterol  (COMBIVENT  RESPIMAT) 20-100 MCG/ACT AERS respimat Inhale 1 puff into the lungs every 6 (six) hours as needed for wheezing or shortness of breath. 08/30/22   Hunsucker, Archer Kobs, MD  Lutein-Zeaxanthin 25-5 MG CAPS Take by mouth.    [provider]  magnesium  hydroxide (MILK OF MAGNESIA) 400 MG/5ML suspension Take 15 mLs by mouth at bedtime.    [provider]  metoprolol  succinate (TOPROL -XL) 100 MG 24 hr tablet Take 100 mg by mouth at bedtime. Take with or immediately following a meal.    [provider]  montelukast  (SINGULAIR ) 10 MG tablet TAKE 1 TABLET BY MOUTH AT  BEDTIME 12/21/22   Hunsucker, Archer Kobs, MD  Multiple Vitamin (MULTIVITAMIN WITH MINERALS) TABS tablet Take 1 tablet by mouth daily.    [provider]  omeprazole  (PRILOSEC) 40 MG capsule Take 1 capsule (40 mg total) by mouth daily before breakfast. 09/20/20   Hunsucker, Archer Kobs, MD  Oxycodone  HCl 10 MG TABS Take 10 mg by mouth every 4 (four) hours as needed (pain). 08/13/19   [provider]  Propylene Glycol, PF, (SYSTANE COMPLETE PF) 0.6 % SOLN Place 1 drop into both eyes 3 (three) times daily as needed (dry  eyes).    [provider]  rosuvastatin  (CRESTOR ) 20 MG tablet Take 1 tablet (20 mg total) by mouth daily. 03/25/23   Leala Prince, PA-C  Simethicone  125 MG CAPS Take 250 mg by mouth 4 (four) times daily as needed (for gas).    [provider]  sodium chloride  (OCEAN) 0.65 % SOLN nasal spray Place 1 spray into both nostrils as needed for congestion. As needed    [provider]  Tamsulosin  HCl (FLOMAX ) 0.4 MG CAPS Take 0.4 mg by mouth 2 (two) times daily.    [provider]  telmisartan (MICARDIS) 80 MG tablet Take 80 mg by mouth daily. Pt takes 40mg  BID    [provider]  timolol (TIMOPTIC) 0.5 % ophthalmic solution 1 drop every morning. 10/12/22   [provider]      Allergies    Amlodipine besylate, Aspirin , Other, Tape, and Wound dressing adhesive    Review of Systems   Review of Systems  Physical Exam Updated Vital Signs BP 92/61   Pulse (!) 112   Temp (!) 97.5 F (36.4 C) (Oral)   Resp (!) 35   SpO2 90%  Physical Exam Constitutional:      General: He is not in acute distress.    Appearance: He is diaphoretic.  HENT:     Head: Normocephalic and atraumatic.  Eyes:     Conjunctiva/sclera: Conjunctivae normal.     Pupils: Pupils are equal, round, and reactive to light.  Cardiovascular:     Rate and Rhythm: Regular rhythm. Tachycardia present.  Pulmonary:     Effort: Pulmonary effort is normal. No respiratory distress.     Comments: 94% on 5L [place], rhonchorous breath sounds in the middle and lower lobes Abdominal:     General: There is no distension.     Tenderness: There is no abdominal tenderness.  Skin:    General: Skin is warm.  Neurological:     General: No focal deficit present.     Mental Status: He is alert. Mental status is at baseline.  Psychiatric:        Mood and Affect: Mood normal.        Behavior: Behavior normal.     ED Results / Procedures / Treatments   Labs (all labs ordered are listed, but  only abnormal results are displayed) Labs Reviewed  COMPREHENSIVE METABOLIC PANEL WITH GFR - Abnormal; Notable for the following components:      Result Value   Potassium 3.2 (*)    Glucose, Bld 108 (*)    Calcium  8.5 (*)    Total Protein 6.1 (*)    Albumin 2.4 (*)    Alkaline Phosphatase 178 (*)    Total Bilirubin 1.6 (*)    All other components within normal limits  CBC WITH DIFFERENTIAL/PLATELET - Abnormal; Notable for the following components:   WBC 30.4 (*)    Platelets 451 (*)    Neutro Abs 26.2 (*)  Lymphs Abs 0.4 (*)    Monocytes Absolute 2.7 (*)    Basophils Absolute 0.2 (*)    Abs Immature Granulocytes 0.92 (*)    All other components within normal limits  PROTIME-INR - Abnormal; Notable for the following components:   Prothrombin Time 15.7 (*)    All other components within normal limits  BRAIN NATRIURETIC PEPTIDE - Abnormal; Notable for the following components:   B Natriuretic Peptide 165.8 (*)    All other components within normal limits  I-STAT CG4 LACTIC ACID, ED - Abnormal; Notable for the following components:   Lactic Acid, Venous 3.0 (*)    All other components within normal limits  TROPONIN I (HIGH SENSITIVITY) - Abnormal; Notable for the following components:   Troponin I (High Sensitivity) 33 (*)    All other components within normal limits  TROPONIN I (HIGH SENSITIVITY) - Abnormal; Notable for the following components:   Troponin I (High Sensitivity) 25 (*)    All other components within normal limits  CULTURE, BLOOD (ROUTINE X 2)  CULTURE, BLOOD (ROUTINE X 2)  BODY FLUID CULTURE W GRAM STAIN  EXPECTORATED SPUTUM ASSESSMENT W GRAM STAIN, RFLX TO RESP C  MRSA NEXT GEN BY PCR, NASAL  URINALYSIS, W/ REFLEX TO CULTURE (INFECTION SUSPECTED)  BODY FLUID CELL COUNT WITH DIFFERENTIAL  GLUCOSE, PLEURAL OR PERITONEAL FLUID  LACTATE DEHYDROGENASE, PLEURAL OR PERITONEAL FLUID  PROTEIN, PLEURAL OR PERITONEAL FLUID  TRIGLYCERIDES, BODY FLUIDS  LACTIC ACID,  PLASMA  LACTIC ACID, PLASMA  STREP PNEUMONIAE URINARY ANTIGEN  PROCALCITONIN  I-STAT CG4 LACTIC ACID, ED  CYTOLOGY - NON PAP    EKG EKG Interpretation Date/Time:  Wednesday May 22 2023 09:45:10 EDT Ventricular Rate:  136 PR Interval:  134 QRS Duration:  125 QT Interval:  310 QTC Calculation: 467 R Axis:   73  Text Interpretation: Sinus tachycardia Right bundle branch block Confirmed by Jerald Molly [phone]) on 05/22/2023 11:39:51 AM  Radiology DG Chest Port 1 View Result Date: 05/22/2023 CLINICAL DATA:  Sepsis. EXAM: PORTABLE CHEST 1 VIEW COMPARISON:  Chest radiograph dated 05/16/2023. FINDINGS: Interval development of a moderate left pleural effusion with opacification of the majority of the left mid to lower lung field. The right lung is clear. No pneumothorax. The cardiac silhouette. No acute osseous pathology. IMPRESSION: Moderate left pleural effusion with opacification of the majority of the left mid to lower lung field. Electronically Signed   By: Angus Bark M.D.   On: 05/22/2023 12:28    Procedures .Critical Care  Performed by: Arvilla Birmingham, MD Authorized by: Arvilla Birmingham, MD   Critical care provider statement:    Critical care time (minutes):  40   Critical care time was exclusive of:  Separately billable procedures and treating other patients   Critical care was necessary to treat or prevent imminent or life-threatening deterioration of the following conditions:  Sepsis   Critical care was time spent personally by me on the following activities:  Ordering and performing treatments and interventions, ordering and review of laboratory studies, ordering and review of radiographic studies, pulse oximetry, review of old charts, examination of patient and evaluation of patient's response to treatment     Medications Ordered in ED Medications  vancomycin  (VANCOCIN ) IVPB 1000 mg/200 mL premix (1,000 mg Intravenous Not Given 05/22/23 1448)  Vancomycin   (VANCOCIN ) 1,250 mg in sodium chloride  0.9 % 250 mL IVPB (has no administration in time range)  budesonide -glycopyrrolate -formoterol  (BREZTRI ) 160-9-4.8 MCG/ACT inhaler 2 puff (has no administration in time  range)  montelukast  (SINGULAIR ) tablet 10 mg (has no administration in time range)  ipratropium-albuterol  (DUONEB) 0.5-2.5 (3) MG/3ML nebulizer solution 3 mL (has no administration in time range)  enoxaparin (LOVENOX) injection 40 mg (has no administration in time range)  insulin aspart (novoLOG) injection 0-15 Units (has no administration in time range)  insulin aspart (novoLOG) injection 0-5 Units (has no administration in time range)  acetaminophen  (TYLENOL ) tablet 650 mg (has no administration in time range)    Or  acetaminophen  (TYLENOL ) suppository 650 mg (has no administration in time range)  traZODone (DESYREL) tablet 25 mg (has no administration in time range)  ondansetron  (ZOFRAN ) tablet 4 mg (has no administration in time range)    Or  ondansetron  (ZOFRAN ) injection 4 mg (has no administration in time range)  sodium chloride  flush (NS) 0.9 % injection 10 mL (has no administration in time range)  alteplase (CATHFLO ACTIVASE) 10 mg in sodium chloride  (PF) 0.9 % 30 mL (has no administration in time range)    And  dornase alfa (PULMOZYME) 5 mg in sterile water  (preservative free) 30 mL (has no administration in time range)  piperacillin-tazobactam (ZOSYN) IVPB 3.375 g (has no administration in time range)  lactated ringers  bolus 2,000 mL (0 mLs Intravenous Stopped 05/22/23 1250)  piperacillin-tazobactam (ZOSYN) IVPB 3.375 g (0 g Intravenous Stopped 05/22/23 1116)  oxyCODONE  (Oxy IR/ROXICODONE ) immediate release tablet 10 mg (10 mg Oral Given 05/22/23 1207)  vancomycin  (VANCOCIN ) IVPB 1000 mg/200 mL premix (1,000 mg Intravenous New Bag/Given 05/22/23 1438)    ED Course/ Medical Decision Making/ A&P Clinical Course as of 05/22/23 1557  Wed May 22, 2023  1139 WBC(!): 30.4 [MT]  1207  Weaned down to 4L [place], RR 30, 94% O2 saturation, mentating well, giving chronic home pain meds 10 mg oxycodone  for back.  Pt and family updated.  They confirm his status as FULL CODE [MT]  1229 Admitted to hospitalist in stable condition, pending CT imaging on admission and repeat lactate check [MT]    Clinical Course User Index [MT] Izyan Ezzell, Janalyn Me, MD                                 Medical Decision Making Amount and/or Complexity of Data Reviewed Labs: ordered. Decision-making details documented in ED Course. Radiology: ordered. ECG/medicine tests: ordered.  Risk Prescription drug management. Decision regarding hospitalization.   This patient presents to the ED with concern for confusion, fevers, chills. This involves an extensive number of treatment options, and is a complaint that carries with it a high risk of complications and morbidity.  The differential diagnosis includes infection including sepsis versus dehydration versus metabolic derangement versus other  Co-morbidities that complicate the patient evaluation: History of recurring pulmonary infection  Additional history obtained from patient's wife at bedside  External records from outside source obtained and reviewed including pulmonology outpatient records  I ordered and personally interpreted labs.  The pertinent results include: White blood cell count 30.4.  Lactate 3.0.  Troponin 33  I ordered imaging studies including x-ray of the chest I independently visualized and interpreted imaging which showed left-sided infiltrate or effusion I agree with the radiologist interpretation  The patient was maintained on a cardiac monitor.  I personally viewed and interpreted the cardiac monitored which showed an underlying rhythm of: Sinus tachycardia  Per my interpretation the patient's ECG shows tachycardia without acute ischemic findings  I ordered medication including sepsis fluid bolus  per ideal body weight,  broad-spectrum antibiotics per sepsis  I have reviewed the patients home medicines and have made adjustments as needed  Test Considered: CT imaging was considered and ordered and discussed at the time of admission.  Inpatient team to follow-up on this  After the interventions noted above, I reevaluated the patient and found that they have: improved    Disposition:  After consideration of the diagnostic results and the patients response to treatment, I feel that the patient would benefit from medical admission.         Final Clinical Impression(s) / ED Diagnoses Final diagnoses:  Sepsis, due to unspecified organism, unspecified whether acute organ dysfunction present [facility])    Rx / DC Orders ED Discharge Orders     None         Arvilla Birmingham, MD 05/22/23 1557

## 2023-05-22 NOTE — ED Triage Notes (Signed)
 Patient to ED by EMS from home code sepsis. Per EMS patient has been feeling bad with weakness and AMS x1 week. Reports productive cough and edema to ABD and bilateral feet. On 3L [place] [id number] 35 93% CBG: 159

## 2023-05-22 NOTE — Progress Notes (Signed)
 Assisted Dr. Felipe Horton w/ placement of L chest tube. Time-out completed prior to start of procedure. VSS throughout, see flowsheet for documentation. Nasal cannula O2 increased from 4L to 6L d/t sustained O2 saturation 87-89%. Signed consent placed in shadow chart, audit form handed off to unit director

## 2023-05-22 NOTE — Consult Note (Signed)
 NAME:  Cody Tate, MRN:  [id number], DOB:  15-Jun-1950, LOS: 0 ADMISSION DATE:  05/22/2023, CONSULTATION DATE:  05/22/23 REFERRING MD:  Jannette Mend, CHIEF COMPLAINT:  SOB   History of Present Illness:  73 year old man w/ hx of smoking, COPD, prior abnormal CT in 2024 with R sided necrotic masses that resolved with abx presenting with about 3 weeks of worsening SOB, cough with intermittent hemoptysis.  Has taken course of doxy then levaquin .  Imaging initially with rounded mass-like left consolidation now with loculated effusion associated.  PCCM consulted to assist with management.  Normally RA, currently high 80s sats on 5LPM.  Pertinent  Medical History   Past Medical History:  Diagnosis Date   Arthritis    Back pain    Radiates down both legs   BPH (benign prostatic hyperplasia)    Cancer (HCC) 2022   prostate cancer   COPD (chronic obstructive pulmonary disease) (HCC)    Dyspnea    Enlarged prostate    GERD (gastroesophageal reflux disease)    Glaucoma    H/O hiatal hernia    H/O wheezing    occ inhaler usage   Heart murmur    Hypertension    Lumbar foraminal stenosis    Macular degeneration    Meningioma (HCC) 2014   Treated with radiation   Neuromuscular disorder (HCC)    Neuropathy bilateral legs   PONV (postoperative nausea and vomiting)    Pulmonary nodules    resolved on 2019 follow up CT   S/P insertion of spinal cord stimulator      Significant Hospital Events: Including procedures, antibiotic start and stop dates in addition to other pertinent events   5/21 admit, pigtail  Interim History / Subjective:  consult  Objective    Blood pressure (!) 91/58, pulse (!) 112, temperature (!) 97.5 F (36.4 C), temperature source Oral, resp. rate 19, SpO2 93%.        Intake/Output Summary (Last 24 hours) at 05/22/2023 1536 Last data filed at 05/22/2023 1250 Gross per 24 hour  Intake 2239.73 ml  Output --  Net 2239.73 ml   There were no vitals filed  for this visit.  Examination: General: no distress but mildly tachypneic HENT: malampatti 4, trachea midline Lungs: reduced left base with rhonci, complex L pleural space with loculations on US  Cardiovascular: tachy, ext warm Abdomen: soft, nontender Extremities: minimal edema Neuro: moves to command, RASS 0 Skin: no rashes  Labs/imaging personally reviewed. Prior low grade +ANA positivity noted CCP, RF, IgE not impressive ANCA neg  Resolved problem list   Assessment and Plan  Sepsis secondary to likely left CAP with empyema formation- will also r/o malignancy but timing less consistent.  With hx of prior cavitary PNA probably need eval for either aspiration or immunodeficiency as OP.  - Pigtail, send usual studies and start thrombolytics - Abx to vanc/zosyn to cover anaerobes - Sputum culture, strep ag, pct - Wean O2 for sats > 90% - Breztri  fine for MDI  Will follow  Best Practice (right click and "Reselect all SmartList Selections" daily)  Per primary  Labs   CBC: Recent Labs  Lab 05/22/23 1005  WBC 30.4*  NEUTROABS 26.2*  HGB 15.9  HCT 48.0  MCV 90.2  PLT 451*    Basic Metabolic Panel: Recent Labs  Lab 05/22/23 1005  NA 135  K 3.2*  CL 99  CO2 23  GLUCOSE 108*  BUN 16  CREATININE 1.15  CALCIUM  8.5*   GFR:  Estimated Creatinine Clearance: 65.1 mL/min (by C-G formula based on SCr of 1.15 mg/dL). Recent Labs  Lab 05/22/23 1005 05/22/23 1018  WBC 30.4*  --   LATICACIDVEN  --  3.0*    Liver Function Tests: Recent Labs  Lab 05/17/23 1146 05/22/23 1005  AST 17 37  ALT 14 36  ALKPHOS 208* 178*  BILITOT 1.2 1.6*  PROT 6.4 6.1*  ALBUMIN 3.6* 2.4*   No results for input(s): "LIPASE", "AMYLASE" in the last 168 hours. No results for input(s): "AMMONIA" in the last 168 hours.  ABG    Component Value Date/Time   TCO2 28 01/12/2008 1606     Coagulation Profile: Recent Labs  Lab 05/22/23 1005  INR 1.2    Cardiac Enzymes: No results  for input(s): "CKTOTAL", "CKMB", "CKMBINDEX", "TROPONINI" in the last 168 hours.  HbA1C: No results found for: "HGBA1C"  CBG: No results for input(s): "GLUCAP" in the last 168 hours.  Review of Systems:    Positive Symptoms in bold:  Constitutional fevers, chills, weight loss, fatigue, anorexia, malaise  Eyes decreased vision, double vision, eye irritation  Ears, Nose, Mouth, Throat sore throat, trouble swallowing, sinus congestion  Cardiovascular chest pain (left pleuritic), paroxysmal nocturnal dyspnea, lower ext edema, palpitations   Respiratory SOB, cough, DOE, hemoptysis, wheezing  Gastrointestinal nausea, vomiting, diarrhea  Genitourinary burning with urination, trouble urinating  Musculoskeletal joint aches, joint swelling, back pain  Integumentary  rashes, skin lesions  Neurological focal weakness, focal numbness, trouble speaking, headaches  Psychiatric depression, anxiety, confusion  Endocrine polyuria, polydipsia, cold intolerance, heat intolerance  Hematologic abnormal bruising, abnormal bleeding, unexplained nose bleeds  Allergic/Immunologic recurrent infections, hives, swollen lymph nodes     Past Medical History:  He,  has a past medical history of Arthritis, Back pain, BPH (benign prostatic hyperplasia), Cancer (HCC) (2022), COPD (chronic obstructive pulmonary disease) (HCC), Dyspnea, Enlarged prostate, GERD (gastroesophageal reflux disease), Glaucoma, H/O hiatal hernia, H/O wheezing, Heart murmur, Hypertension, Lumbar foraminal stenosis, Macular degeneration, Meningioma (HCC) (2014), Neuromuscular disorder (HCC), PONV (postoperative nausea and vomiting), Pulmonary nodules, and S/P insertion of spinal cord stimulator.   Surgical History:   Past Surgical History:  Procedure Laterality Date   ABDOMINAL EXPOSURE N/A 03/16/2022   Procedure: ABDOMINAL EXPOSURE;  Surgeon: Young Hensen, MD;  Location: [facility] OR;  Service: Vascular;  Laterality: N/A;   ANTERIOR  LUMBAR FUSION N/A 03/16/2022   Procedure: Anterior Lumbar Interbody Fusion - Lumbar Five-Sacral One with cage;  Surgeon: Agustina Aldrich, MD;  Location: [facility] OR;  Service: Neurosurgery;  Laterality: N/A;   BACK SURGERY  06/08/2006   lam x3 cerv x 1   CERVICAL FUSION     COLONOSCOPY  2020   CYSTOSCOPY     73 yrs old   CYSTOSCOPY WITH URETHRAL DILATATION N/A 09/21/2021   Procedure: CYSTOSCOPY WITH BALLOON DILATION and OPTILUME BALLOON DILATION OF  URETHRAL DILATATION;  Surgeon: Roxane Copp, MD;  Location: [facility] ORS;  Service: Urology;  Laterality: N/A;   EYE SURGERY     bil   GOLD SEED IMPLANT N/A 09/06/2020   Procedure: GOLD SEED IMPLANT/ PLACEMENT OF FIDUCIAL MARKERS;  Surgeon: Roxane Copp, MD;  Location: [facility] ORS;  Service: Urology;  Laterality: N/A;   KNEE ARTHROSCOPY  01/02/2008   lft   KNEE ARTHROSCOPY Left 07/23/2012   Procedure: LEFT MEDIAL MENISCAL DEBRIDEMENT AND CHONDROPLASTY;  Surgeon: Aurther Blue, MD;  Location: [facility] ORS;  Service: Orthopedics;  Laterality: Left;   LUMBAR FUSION  02/22/2014   DR  POOL   PILONIDAL CYST EXCISION     SINUS EXPLORATION     SPACE OAR INSTILLATION N/A 09/06/2020   Procedure: SPACE OAR INSTILLATION;  Surgeon: Roxane Copp, MD;  Location: [facility] OR[facility];  Service: Urology;  Laterality: N/A;   SPINAL CORD STIMULATOR INSERTION N/A 01/16/2018   Procedure: LUMBAR SPINAL CORD STIMULATOR INSERTION;  Surgeon: Isadora Mar, MD;  Location: [facility] OR;  Service: Neurosurgery;  Laterality: N/A;  LUMBAR SPINAL CORD STIMULATOR INSERTION   TRANSURETHRAL RESECTION OF PROSTATE N/A 09/06/2020   Procedure: TRANSURETHRAL RESECTION OF THE PROSTATE (TURP);  Surgeon: Roxane Copp, MD;  Location: [facility] ORS;  Service: Urology;  Laterality: N/A;  2 HRS   WRIST GANGLION EXCISION     rt   WRIST SURGERY  01/02/2007   cyst lft     Social History:   reports that he quit smoking about 23 years ago. His smoking use included cigarettes. He started smoking about 58 years ago. He has  a 52.5 pack-year smoking history. He has never used smokeless tobacco. He reports that he does not currently use alcohol . He reports that he does not use drugs.   Family History:  His family history includes Bone cancer in his mother; COPD in his maternal uncle; Hypertension in his mother; Stroke (age of onset: 24) in his father.   Allergies Allergies  Allergen Reactions   Amlodipine Besylate     Other Reaction(s): fatigue   Aspirin      Other Reaction(s): GI upset   Other Itching, Other (See Comments), Rash and Dermatitis    VICRYL Sutures   Tape Itching, Other (See Comments) and Rash    Occlusive tape and bandaids   Wound Dressing Adhesive Rash     Home Medications  Prior to Admission medications   Medication Sig Start Date End Date Taking? Authorizing Provider  Ascorbic Acid  (VITAMIN C ) 1000 MG tablet Take 1,000 mg by mouth daily. 12/31/18   [provider]  aspirin  EC 81 MG tablet Take 1 tablet (81 mg total) by mouth daily. Swallow whole. 03/21/23   Leala Prince, PA-C  budeson-glycopyrrolate -formoterol  (BREZTRI  AEROSPHERE) 160-9-4.8 MCG/ACT AERO inhaler Inhale 2 puffs into the lungs in the morning and at bedtime. 04/23/23   Maire Scot, MD  celecoxib (CELEBREX) 200 MG capsule TAKE 1 TO 2 CAPSULES BY MOUTH DAILY WITH FOOD for 90    [provider]  chlorhexidine  (HIBICLENS ) 4 % external liquid Apply 1 Application topically daily as needed (boils).    [provider]  Cholecalciferol  (VITAMIN D3) 50 MCG (2000 UT) capsule Take 4,000 Units by mouth daily. 12/22/18   [provider]  diphenhydrAMINE  (BENADRYL  ALLERGY ) 25 MG tablet Take 25 mg by mouth at bedtime.    [provider]  empagliflozin  (JARDIANCE ) 10 MG TABS tablet TAKE 1 TABLET(10 MG) BY MOUTH DAILY BEFORE BREAKFAST 03/12/23   Williams, Evan, PA-C  finasteride  (PROSCAR ) 5 MG tablet Take 5 mg by mouth daily. 05/13/20   [provider]  fluticasone  (FLONASE ) 50 MCG/ACT  nasal spray Place 2 sprays into both nostrils daily.     [provider]  furosemide  (LASIX ) 20 MG tablet Take 1 tablet (20 mg total) by mouth daily. 11/19/22 03/21/23  Leala Prince, PA-C  gabapentin  (NEURONTIN ) 300 MG capsule Take 300 mg by mouth 4 (four) times daily.    [provider]  Guaifenesin  (MUCINEX  MAXIMUM STRENGTH) 1200 MG TB12 Take 1,200 mg by mouth 2 (two) times daily.    [provider]  Ipratropium-Albuterol  (  COMBIVENT  RESPIMAT) 20-100 MCG/ACT AERS respimat Inhale 1 puff into the lungs every 6 (six) hours as needed for wheezing or shortness of breath. 08/30/22   Hunsucker, Archer Kobs, MD  Lutein-Zeaxanthin 25-5 MG CAPS Take by mouth.    [provider]  magnesium  hydroxide (MILK OF MAGNESIA) 400 MG/5ML suspension Take 15 mLs by mouth at bedtime.    [provider]  metoprolol  succinate (TOPROL -XL) 100 MG 24 hr tablet Take 100 mg by mouth at bedtime. Take with or immediately following a meal.    [provider]  montelukast  (SINGULAIR ) 10 MG tablet TAKE 1 TABLET BY MOUTH AT  BEDTIME 12/21/22   Hunsucker, Archer Kobs, MD  Multiple Vitamin (MULTIVITAMIN WITH MINERALS) TABS tablet Take 1 tablet by mouth daily.    [provider]  omeprazole  (PRILOSEC) 40 MG capsule Take 1 capsule (40 mg total) by mouth daily before breakfast. 09/20/20   Hunsucker, Archer Kobs, MD  Oxycodone  HCl 10 MG TABS Take 10 mg by mouth every 4 (four) hours as needed (pain). 08/13/19   [provider]  Propylene Glycol, PF, (SYSTANE COMPLETE PF) 0.6 % SOLN Place 1 drop into both eyes 3 (three) times daily as needed (dry eyes).    [provider]  rosuvastatin  (CRESTOR ) 20 MG tablet Take 1 tablet (20 mg total) by mouth daily. 03/25/23   Leala Prince, PA-C  Simethicone  125 MG CAPS Take 250 mg by mouth 4 (four) times daily as needed (for gas).    [provider]  sodium chloride  (OCEAN) 0.65 % SOLN nasal spray Place 1 spray into both  nostrils as needed for congestion. As needed    [provider]  Tamsulosin  HCl (FLOMAX ) 0.4 MG CAPS Take 0.4 mg by mouth 2 (two) times daily.    [provider]  telmisartan (MICARDIS) 80 MG tablet Take 80 mg by mouth daily. Pt takes 40mg  BID    [provider]  timolol (TIMOPTIC) 0.5 % ophthalmic solution 1 drop every morning. 10/12/22   [provider]     Critical care time: N/A

## 2023-05-22 NOTE — Procedures (Signed)
 Insertion of Chest Tube Procedure Note  Cody Tate  [id number]  17-Apr-1950  Date:05/22/23  Time:3:27 PM    Provider Performing: Josiah Nigh   Procedure: Pleural Catheter Insertion w/ Imaging Guidance (21308)  Indication(s) Effusion  Consent Risks of the procedure as well as the alternatives and risks of each were explained to the patient and/or caregiver.  Consent for the procedure was obtained and is signed in the bedside chart  Anesthesia Topical only with 1% lidocaine     Time Out Verified patient identification, verified procedure, site/side was marked, verified correct patient position, special equipment/implants available, medications/allergies/relevant history reviewed, required imaging and test results available.   Sterile Technique Maximal sterile technique including full sterile barrier drape, hand hygiene, sterile gown, sterile gloves, mask, hair covering, sterile ultrasound probe cover (if used).   Procedure Description Ultrasound used to identify appropriate pleural anatomy for placement and overlying skin marked. Area of placement cleaned and draped in sterile fashion.  A 14 French pigtail pleural catheter was placed into the left pleural space using Seldinger technique. Appropriate return of fluid was obtained.  The tube was connected to atrium and placed on -20 cm H2O wall suction.   Complications/Tolerance None; patient tolerated the procedure well. Chest X-ray is ordered to verify placement.   EBL Minimal  Specimen(s) fluid

## 2023-05-22 NOTE — Sepsis Progress Note (Signed)
 Notified provider of need to order repeat lactic acid.

## 2023-05-22 NOTE — ED Notes (Signed)
 RN found patient with sat at 75% he had removed his [place] RN reapplied it he is now 98% 5L via [place]

## 2023-05-22 NOTE — H&P (Signed)
 History and Physical  Cody Tate EXB:[id number] DOB: 04/14/1950 DOA: 05/22/2023  PCP: Benedetta Bradley, MD   Chief Complaint: Fever, cough  HPI: Cody Tate is a 73 y.o. male with medical history significant for COPD on room air, hyperlipidemia, CAD, hypertension recent outpatient treatment for COPD exacerbation and suspected pneumonia being admitted to the hospital with acute hypoxic respiratory failure and sepsis.  He was recently treated by pulmonology as an outpatient with courses of p.o. Levaquin  and doxycycline , he seemed to improve overall and was stable on room air.  Starting about 3 days ago, he was intermittently agitated but much according to his wife, seem to be slightly confused yesterday.  Also yesterday, he started having a frothy cough and feeling short of breath.  He had a temperature of 101 yesterday.  This morning, he also seemed a little bit lethargic/confused, not really answering the questions appropriately but this was somewhat transient.  Patient and wife deny any history of nausea, vomiting, chest pain, diarrhea or other concerns.  Review of Systems: Please see HPI for pertinent positives and negatives. A complete 10 system review of systems are otherwise negative.  Past Medical History:  Diagnosis Date   Arthritis    Back pain    Radiates down both legs   BPH (benign prostatic hyperplasia)    Cancer (HCC) 2022   prostate cancer   COPD (chronic obstructive pulmonary disease) (HCC)    Dyspnea    Enlarged prostate    GERD (gastroesophageal reflux disease)    Glaucoma    H/O hiatal hernia    H/O wheezing    occ inhaler usage   Heart murmur    Hypertension    Lumbar foraminal stenosis    Macular degeneration    Meningioma (HCC) 2014   Treated with radiation   Neuromuscular disorder (HCC)    Neuropathy bilateral legs   PONV (postoperative nausea and vomiting)    Pulmonary nodules    resolved on 2019 follow up CT   S/P insertion of spinal cord  stimulator    Past Surgical History:  Procedure Laterality Date   ABDOMINAL EXPOSURE N/A 03/16/2022   Procedure: ABDOMINAL EXPOSURE;  Surgeon: Young Hensen, MD;  Location: [facility] OR;  Service: Vascular;  Laterality: N/A;   ANTERIOR LUMBAR FUSION N/A 03/16/2022   Procedure: Anterior Lumbar Interbody Fusion - Lumbar Five-Sacral One with cage;  Surgeon: Agustina Aldrich, MD;  Location: [facility] OR;  Service: Neurosurgery;  Laterality: N/A;   BACK SURGERY  06/08/2006   lam x3 cerv x 1   CERVICAL FUSION     COLONOSCOPY  2020   CYSTOSCOPY     73 yrs old   CYSTOSCOPY WITH URETHRAL DILATATION N/A 09/21/2021   Procedure: CYSTOSCOPY WITH BALLOON DILATION and OPTILUME BALLOON DILATION OF  URETHRAL DILATATION;  Surgeon: Roxane Copp, MD;  Location: [facility] ORS;  Service: Urology;  Laterality: N/A;   EYE SURGERY     bil   GOLD SEED IMPLANT N/A 09/06/2020   Procedure: GOLD SEED IMPLANT/ PLACEMENT OF FIDUCIAL MARKERS;  Surgeon: Roxane Copp, MD;  Location: [facility] ORS;  Service: Urology;  Laterality: N/A;   KNEE ARTHROSCOPY  01/02/2008   lft   KNEE ARTHROSCOPY Left 07/23/2012   Procedure: LEFT MEDIAL MENISCAL DEBRIDEMENT AND CHONDROPLASTY;  Surgeon: Aurther Blue, MD;  Location: [facility] ORS;  Service: Orthopedics;  Laterality: Left;   LUMBAR FUSION  02/22/2014   DR POOL   PILONIDAL CYST EXCISION     SINUS EXPLORATION  SPACE OAR INSTILLATION N/A 09/06/2020   Procedure: SPACE OAR INSTILLATION;  Surgeon: Roxane Copp, MD;  Location: [facility] ORS;  Service: Urology;  Laterality: N/A;   SPINAL CORD STIMULATOR INSERTION N/A 01/16/2018   Procedure: LUMBAR SPINAL CORD STIMULATOR INSERTION;  Surgeon: Isadora Mar, MD;  Location: [facility] OR;  Service: Neurosurgery;  Laterality: N/A;  LUMBAR SPINAL CORD STIMULATOR INSERTION   TRANSURETHRAL RESECTION OF PROSTATE N/A 09/06/2020   Procedure: TRANSURETHRAL RESECTION OF THE PROSTATE (TURP);  Surgeon: Roxane Copp, MD;  Location: [facility] ORS;  Service: Urology;  Laterality: N/A;   2 HRS   WRIST GANGLION EXCISION     rt   WRIST SURGERY  01/02/2007   cyst lft   Social History:  reports that he quit smoking about 23 years ago. His smoking use included cigarettes. He started smoking about 58 years ago. He has a 52.5 pack-year smoking history. He has never used smokeless tobacco. He reports that he does not currently use alcohol . He reports that he does not use drugs.  Allergies  Allergen Reactions   Other Itching, Rash and Other (See Comments)    VICRYL Sutures   Tape Itching, Other (See Comments) and Rash    Occlusive tape and bandaids    Family History  Problem Relation Age of Onset   Hypertension Mother    Bone cancer Mother    Stroke Father 31   COPD Maternal Uncle      Prior to Admission medications   Medication Sig Start Date End Date Taking? Authorizing Provider  Ascorbic Acid  (VITAMIN C ) 1000 MG tablet Take 1,000 mg by mouth daily. 12/31/18   [provider]  aspirin  EC 81 MG tablet Take 1 tablet (81 mg total) by mouth daily. Swallow whole. Patient not taking: Reported on 05/21/2023 03/21/23   Leala Prince, PA-C  budeson-glycopyrrolate -formoterol  (BREZTRI  AEROSPHERE) 160-9-4.8 MCG/ACT AERO inhaler Inhale 2 puffs into the lungs in the morning and at bedtime. 04/23/23   Maire Scot, MD  celecoxib (CELEBREX) 200 MG capsule TAKE 1 TO 2 CAPSULES BY MOUTH DAILY WITH FOOD for 90    [provider]  chlorhexidine  (HIBICLENS ) 4 % external liquid Apply 1 Application topically daily as needed (boils). Patient not taking: Reported on 05/21/2023    [provider]  Cholecalciferol  (VITAMIN D3) 50 MCG (2000 UT) capsule Take 4,000 Units by mouth daily. 12/22/18   [provider]  diphenhydrAMINE  (BENADRYL  ALLERGY ) 25 MG tablet Take 25 mg by mouth at bedtime.    [provider]  empagliflozin  (JARDIANCE ) 10 MG TABS tablet TAKE 1 TABLET(10 MG) BY MOUTH DAILY BEFORE BREAKFAST 03/12/23   Williams, Evan, PA-C  finasteride   (PROSCAR ) 5 MG tablet Take 5 mg by mouth daily. 05/13/20   [provider]  fluticasone  (FLONASE ) 50 MCG/ACT nasal spray Place 2 sprays into both nostrils daily.     [provider]  furosemide  (LASIX ) 20 MG tablet Take 1 tablet (20 mg total) by mouth daily. 11/19/22 03/21/23  Williams, Evan, PA-C  gabapentin  (NEURONTIN ) 300 MG capsule Take 300 mg by mouth 4 (four) times daily.    [provider]  Guaifenesin  (MUCINEX  MAXIMUM STRENGTH) 1200 MG TB12 Take 1,200 mg by mouth 2 (two) times daily.    [provider]  Ipratropium-Albuterol  (COMBIVENT  RESPIMAT) 20-100 MCG/ACT AERS respimat Inhale 1 puff into the lungs every 6 (six) hours as needed for wheezing or shortness of breath. 08/30/22   Hunsucker, Archer Kobs, MD  Lutein-Zeaxanthin 25-5 MG CAPS Take by mouth.  [provider]  magnesium  hydroxide (MILK OF MAGNESIA) 400 MG/5ML suspension Take 15 mLs by mouth at bedtime.    [provider]  metoprolol  succinate (TOPROL -XL) 100 MG 24 hr tablet Take 100 mg by mouth at bedtime. Take with or immediately following a meal.    [provider]  montelukast  (SINGULAIR ) 10 MG tablet TAKE 1 TABLET BY MOUTH AT  BEDTIME 12/21/22   Hunsucker, Archer Kobs, MD  Multiple Vitamin (MULTIVITAMIN WITH MINERALS) TABS tablet Take 1 tablet by mouth daily.    [provider]  omeprazole  (PRILOSEC) 40 MG capsule Take 1 capsule (40 mg total) by mouth daily before breakfast. 09/20/20   Hunsucker, Archer Kobs, MD  Oxycodone  HCl 10 MG TABS Take 10 mg by mouth every 4 (four) hours as needed (pain). 08/13/19   [provider]  Propylene Glycol, PF, (SYSTANE COMPLETE PF) 0.6 % SOLN Place 1 drop into both eyes 3 (three) times daily as needed (dry eyes).    [provider]  rosuvastatin  (CRESTOR ) 20 MG tablet Take 1 tablet (20 mg total) by mouth daily. 03/25/23   Leala Prince, PA-C  Simethicone  125 MG CAPS Take 250 mg by mouth 4 (four) times daily as needed  (for gas).    [provider]  sodium chloride  (OCEAN) 0.65 % SOLN nasal spray Place 1 spray into both nostrils as needed for congestion. As needed    [provider]  Tamsulosin  HCl (FLOMAX ) 0.4 MG CAPS Take 0.4 mg by mouth 2 (two) times daily.    [provider]  telmisartan (MICARDIS) 80 MG tablet Take 80 mg by mouth daily. Pt takes 40mg  BID    [provider]  timolol (TIMOPTIC) 0.5 % ophthalmic solution 1 drop every morning. 10/12/22   [provider]    Physical Exam: BP 98/67 (BP Location: Left Arm)   Pulse (!) 112   Temp (!) 97.5 F (36.4 C) (Oral)   Resp 19   SpO2 93%  General: Patient appears weak but he is awake, oriented, in no acute physical pain.  He is resting comfortably currently on 4 L nasal cannula oxygen. Cardiovascular: RRR, no murmurs or rubs, minimal peripheral edema  Respiratory: Breath sounds are diminished at the globally, significantly reduced at the left base.  No active wheezing or other evidence of respiratory distress. Abdomen: soft, nontender, nondistended, normal bowel tones heard  Skin: dry, no rashes  Musculoskeletal: no joint effusions, normal range of motion  Psychiatric: appropriate affect, normal speech  Neurologic: extraocular muscles intact, clear speech, moving all extremities with intact sensorium         Labs on Admission:  Basic Metabolic Panel: Recent Labs  Lab 05/22/23 1005  NA 135  K 3.2*  CL 99  CO2 23  GLUCOSE 108*  BUN 16  CREATININE 1.15  CALCIUM  8.5*   Liver Function Tests: Recent Labs  Lab 05/17/23 1146 05/22/23 1005  AST 17 37  ALT 14 36  ALKPHOS 208* 178*  BILITOT 1.2 1.6*  PROT 6.4 6.1*  ALBUMIN 3.6* 2.4*   No results for input(s): "LIPASE", "AMYLASE" in the last 168 hours. No results for input(s): "AMMONIA" in the last 168 hours. CBC: Recent Labs  Lab 05/22/23 1005  WBC 30.4*  NEUTROABS 26.2*  HGB 15.9  HCT 48.0  MCV 90.2  PLT 451*   Cardiac  Enzymes: No results for input(s): "CKTOTAL", "CKMB", "CKMBINDEX", "TROPONINI" in the last 168 hours. BNP (last 3 results) Recent Labs    05/22/23  1005  BNP 165.8*    ProBNP (last 3 results) Recent Labs    11/12/22 1035 11/15/22 1450 03/21/23 1307  PROBNP 227 119.0* 335    CBG: No results for input(s): "GLUCAP" in the last 168 hours.  Radiological Exams on Admission: DG Chest Port 1 View Result Date: 05/22/2023 CLINICAL DATA:  Sepsis. EXAM: PORTABLE CHEST 1 VIEW COMPARISON:  Chest radiograph dated 05/16/2023. FINDINGS: Interval development of a moderate left pleural effusion with opacification of the majority of the left mid to lower lung field. The right lung is clear. No pneumothorax. The cardiac silhouette. No acute osseous pathology. IMPRESSION: Moderate left pleural effusion with opacification of the majority of the left mid to lower lung field. Electronically Signed   By: Angus Bark M.D.   On: 05/22/2023 12:28   Assessment/Plan Cody Tate is a 73 y.o. male with medical history significant for COPD on room air, hyperlipidemia, CAD, hypertension recent outpatient treatment for COPD exacerbation and suspected pneumonia being admitted to the hospital with acute hypoxic respiratory failure and sepsis.   Severe sepsis-meeting criteria with leukocytosis, tachycardia, fever 101 at home.  Source is suspected community-acquired pneumonia/empyema.  Chest x-ray as above with moderate left pleural effusion. -Inpatient admission to progressive -Follow-up blood cultures -Empiric broad-spectrum IV antibiotics  Moderate left pleural effusion-concern for possible empyema given his recent history of multiple courses of antibiotics, fever, sepsis, etc. -Empiric broad-spectrum IV antibiotics -Follow-up CT chest -Discussed with pulmonology, inpatient consult requested -Consult placed for IR ultrasound-guided diagnostic and therapeutic paracentesis  Acute hypoxic respiratory  failure-likely due to combination of his empyema, suspected pneumonia, and potentially due to exacerbation of his COPD as well.  Will follow-up CT chest to evaluate for PE, or other complication.  COPD-no clear evidence of acute exacerbation at this time -Continue Singulair , Breztri  -Supplemental oxygen as above, wean as tolerated -DuoNeb every 6 hours as needed  GERD-omeprazole   Hypertension-will continue Micardis and other home medications once reconciled  Diabetes-not insulin-dependent, carb modified diet with moderate dose sliding scale  Hyperlipidemia-Crestor   DVT prophylaxis: Lovenox     Code Status: Full Code  Consults called: Pulmonology  Admission status: The appropriate patient status for this patient is INPATIENT. Inpatient status is judged to be reasonable and necessary in order to provide the required intensity of service to ensure the patient's safety. The patient's presenting symptoms, physical exam findings, and initial radiographic and laboratory data in the context of their chronic comorbidities is felt to place them at high risk for further clinical deterioration. Furthermore, it is not anticipated that the patient will be medically stable for discharge from the hospital within 2 midnights of admission.    I certify that at the point of admission it is my clinical judgment that the patient will require inpatient hospital care spanning beyond 2 midnights from the point of admission due to high intensity of service, high risk for further deterioration and high frequency of surveillance required  Time spent: 59 minutes  Genesis Paget Rickey Charm MD [facility] Pager [phone]  If 7PM-7AM, please contact night-coverage [email or web address] Password [email or web address]  05/22/2023, 2:09 PM

## 2023-05-23 ENCOUNTER — Encounter (HOSPITAL_COMMUNITY): Payer: Self-pay | Admitting: Internal Medicine

## 2023-05-23 ENCOUNTER — Inpatient Hospital Stay (HOSPITAL_COMMUNITY)

## 2023-05-23 DIAGNOSIS — E66812 Obesity, class 2: Secondary | ICD-10-CM

## 2023-05-23 DIAGNOSIS — I10 Essential (primary) hypertension: Secondary | ICD-10-CM

## 2023-05-23 DIAGNOSIS — J918 Pleural effusion in other conditions classified elsewhere: Secondary | ICD-10-CM

## 2023-05-23 DIAGNOSIS — J189 Pneumonia, unspecified organism: Secondary | ICD-10-CM | POA: Diagnosis not present

## 2023-05-23 DIAGNOSIS — E871 Hypo-osmolality and hyponatremia: Secondary | ICD-10-CM

## 2023-05-23 DIAGNOSIS — E876 Hypokalemia: Secondary | ICD-10-CM | POA: Insufficient documentation

## 2023-05-23 DIAGNOSIS — A419 Sepsis, unspecified organism: Secondary | ICD-10-CM | POA: Diagnosis not present

## 2023-05-23 DIAGNOSIS — J869 Pyothorax without fistula: Secondary | ICD-10-CM | POA: Diagnosis not present

## 2023-05-23 DIAGNOSIS — M7989 Other specified soft tissue disorders: Secondary | ICD-10-CM

## 2023-05-23 DIAGNOSIS — J9601 Acute respiratory failure with hypoxia: Secondary | ICD-10-CM

## 2023-05-23 LAB — HEMOGLOBIN A1C
Hgb A1c MFr Bld: 5.3 % (ref 4.8–5.6)
Mean Plasma Glucose: 105.41 mg/dL

## 2023-05-23 LAB — BASIC METABOLIC PANEL WITH GFR
Anion gap: 9 (ref 5–15)
BUN: 22 mg/dL (ref 8–23)
CO2: 25 mmol/L (ref 22–32)
Calcium: 8 mg/dL — ABNORMAL LOW (ref 8.9–10.3)
Chloride: 93 mmol/L — ABNORMAL LOW (ref 98–111)
Creatinine, Ser: 1.05 mg/dL (ref 0.61–1.24)
GFR, Estimated: >60 mL/min (ref >60)
Glucose, Bld: 109 mg/dL — ABNORMAL HIGH (ref 70–99)
Potassium: 4.1 mmol/L (ref 3.5–5.1)
Sodium: 127 mmol/L — ABNORMAL LOW (ref 135–145)

## 2023-05-23 LAB — CBC
HCT: 40.6 % (ref 39.0–52.0)
Hemoglobin: 12.6 g/dL — ABNORMAL LOW (ref 13.0–17.0)
MCH: 29.4 pg (ref 26.0–34.0)
MCHC: 31 g/dL (ref 30.0–36.0)
MCV: 94.6 fL (ref 80.0–100.0)
Platelets: 365 10*3/uL (ref 150–400)
RBC: 4.29 MIL/uL (ref 4.22–5.81)
RDW: 13.3 % (ref 11.5–15.5)
WBC: 36.2 10*3/uL — ABNORMAL HIGH (ref 4.0–10.5)
nRBC: 0 % (ref 0.0–0.2)

## 2023-05-23 LAB — GLUCOSE, CAPILLARY
Glucose-Capillary: 114 mg/dL — ABNORMAL HIGH (ref 70–99)
Glucose-Capillary: 140 mg/dL — ABNORMAL HIGH (ref 70–99)
Glucose-Capillary: 114 mg/dL — ABNORMAL HIGH (ref 70–99)
Glucose-Capillary: 168 mg/dL — ABNORMAL HIGH (ref 70–99)

## 2023-05-23 LAB — TRIGLYCERIDES, BODY FLUIDS: Triglycerides, Fluid: 27 mg/dL

## 2023-05-23 LAB — STREP PNEUMONIAE URINARY ANTIGEN: Strep Pneumo Urinary Antigen: NEGATIVE

## 2023-05-23 MED ORDER — METOPROLOL TARTRATE 25 MG PO TABS
25.0000 mg | ORAL_TABLET | Freq: Two times a day (BID) | ORAL | Status: DC
  Administered 2023-05-23 – 2023-05-28 (×11): 25 mg via ORAL
  Filled 2023-05-23 (×11): qty 1

## 2023-05-23 MED ORDER — OXYCODONE HCL 5 MG PO TABS
10.0000 mg | ORAL_TABLET | ORAL | Status: DC | PRN
  Administered 2023-05-23 – 2023-05-28 (×20): 10 mg via ORAL
  Filled 2023-05-23 (×20): qty 2

## 2023-05-23 MED ORDER — KETOROLAC TROMETHAMINE 15 MG/ML IJ SOLN
15.0000 mg | Freq: Four times a day (QID) | INTRAMUSCULAR | Status: DC
  Administered 2023-05-23 – 2023-05-26 (×11): 15 mg via INTRAVENOUS
  Filled 2023-05-23 (×12): qty 1

## 2023-05-23 MED ORDER — SODIUM CHLORIDE (PF) 0.9 % IJ SOLN
10.0000 mg | Freq: Once | INTRAMUSCULAR | Status: AC
  Administered 2023-05-23: 10 mg via INTRAPLEURAL
  Filled 2023-05-23: qty 10

## 2023-05-23 MED ORDER — ACETAMINOPHEN 325 MG PO TABS
650.0000 mg | ORAL_TABLET | Freq: Four times a day (QID) | ORAL | Status: DC
  Administered 2023-05-23 – 2023-05-28 (×20): 650 mg via ORAL
  Filled 2023-05-23 (×19): qty 2

## 2023-05-23 MED ORDER — SALINE SPRAY 0.65 % NA SOLN
1.0000 | NASAL | Status: DC | PRN
  Administered 2023-05-23: 1 via NASAL
  Filled 2023-05-23: qty 44

## 2023-05-23 MED ORDER — STERILE WATER FOR INJECTION IJ SOLN
5.0000 mg | Freq: Once | RESPIRATORY_TRACT | Status: AC
  Administered 2023-05-23: 5 mg via INTRAPLEURAL
  Filled 2023-05-23: qty 5

## 2023-05-23 NOTE — Plan of Care (Signed)
  Problem: Education: Goal: Knowledge of General Education information will improve Description: Including pain rating scale, medication(s)/side effects and non-pharmacologic comfort measures Outcome: Progressing   Problem: Clinical Measurements: Goal: Diagnostic test results will improve Outcome: Progressing Goal: Respiratory complications will improve Outcome: Progressing   Problem: Activity: Goal: Risk for activity intolerance will decrease Outcome: Progressing   Problem: Nutrition: Goal: Adequate nutrition will be maintained Outcome: Progressing   Problem: Pain Managment: Goal: General experience of comfort will improve and/or be controlled Outcome: Progressing   Problem: Safety: Goal: Ability to remain free from injury will improve Outcome: Progressing

## 2023-05-23 NOTE — Assessment & Plan Note (Addendum)
 05-23-2023 not hypotensive. WBC still 36K. Awaiting blood, sputum, pleural fluid cultures. On Zosyn/Vanco.   05-24-2023 WBC 27K. Remains on Zosyn/vanco. Blood, sputum, pleural fluid cultures negative thus far.  05-25-2023 WBC down to 18. On Zosyn only. Vanco stopped yesterday. Trach aspirate only growing rare pseudomonas. Pleural fluid and blood cx are negative.  05-26-2023 PCCM thinks pt's CAP caused by pseudomonas. Awaiting final sensitivities for pseudomonas growing from his respiratory cultures. He remains on IV zosyn.  05-27-2023 awaiting final MIC on pseudomonas. PCCM wants him treated with additional 3 week at time of discharge if pseudomonas susceptible to quinolones.  05-28-2023 after further discussion with pharmacy and lab, pt's rare pseudomonas on his respiratory culture should have NOT been flagged as pathologic. Discussed with Dr. Bertrum Brodie. Ok to discharge him with po augmentin for 8 days(to complete total of 14 days of treatment). He will f/u with pulmonology in clinic in 2 weeks. Dr. Bertrum Brodie did not think bronchoscopy was indicated during current hospital stay.  Home O2 evaluation performed prior to discharge. He does not need home O2.

## 2023-05-23 NOTE — Assessment & Plan Note (Addendum)
 05-23-2023 left pigtail chest tube inserted yesterday. On Day #2 of pleural lytics. Already has had out at least 2 liters from chest tube.  05-24-2023 awaiting PCCM management of chest tube/pleural lytics.  05-25-2023 has had total of 3 doses of intra-pleural lytics. Awaiting PCCM to pulled chest tube. CT chest shows cavitary pneumonia.  05-26-2023 resolved. Chest tube pulled yesterday. PCCM thinks pt's CAP caused by pseudomonas. Awaiting final sensitivities for pseudomonas growing from his respiratory cultures. He remains on IV zosyn.  05-27-2023 resolved.

## 2023-05-23 NOTE — Assessment & Plan Note (Addendum)
 05-23-2023 has valid Rx for oxycodone  10 mg q4h prn at home.  05-24-2023 continue oxycodone  prn.  05-25-2023 stable  05-26-2023 stable  05-27-2023 stable.

## 2023-05-23 NOTE — Assessment & Plan Note (Addendum)
 05-23-2023 holding HTN meds due to sepsis.  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  BP controlled with much lower doses of lopressor (25 mg bid) than his usual home dose of Toprol -XL 100 mg daily. Continue to hold ARB and diuretic for now.  05-25-2023 BP on the rise today.  After chest tube pulled, may need to increase his HTN meds. Increased BP may be due to pain from chest tube.  05-26-2023 restarting his ARB.  05-27-2023 BP improved with restarting ARB. Remains on lopressor  25 mg bid. Can restart Toprol -XL 100 mg day at discharge.

## 2023-05-23 NOTE — Hospital Course (Addendum)
 HPI: Cody Tate is a 73 y.o. male with medical history significant for COPD on room air, hyperlipidemia, CAD, hypertension recent outpatient treatment for COPD exacerbation and suspected pneumonia being admitted to the hospital with acute hypoxic respiratory failure and sepsis.  He was recently treated by pulmonology as an outpatient with courses of p.o. Levaquin  and doxycycline , he seemed to improve overall and was stable on room air.  Starting about 3 days ago, he was intermittently agitated but much according to his wife, seem to be slightly confused yesterday.  Also yesterday, he started having a frothy cough and feeling short of breath.  He had a temperature of 101 yesterday.  This morning, he also seemed a little bit lethargic/confused, not really answering the questions appropriately but this was somewhat transient.  Patient and wife deny any history of nausea, vomiting, chest pain, diarrhea or other concerns.   Significant Events: Admitted 05/22/2023 for acute hypoxic respiratory failure 05-22-2023 chest tube inserted by PCCM 05-21 thru 5-23 had daily intra-pleural lytics 05-25-2023 chest tube removed.  Admission Labs: Lactic acid 3.0 Na 135, K 3.2, CO2 of 23, BUN 16, Scr 1.15, glu 108 WBC 30.4, HgB 15.9, plt 451, >20% bands BNP 165 Procal 8.7  Admission Imaging Studies: CXR Moderate left pleural effusion with opacification of the majority of the left mid to lower lung field  Significant Labs: Pleural fluid analysis. Fluid LDH 1017, protein 3.9, gluc <20, >10K nucleated cells. 63% neutrophils Strep Pneumo antigen Negative  Significant Imaging Studies:   Antibiotic Therapy: Anti-infectives (From admission, onward)    Start     Dose/Rate Route Frequency Ordered Stop   05/23/23 1400  Vancomycin  (VANCOCIN ) 1,250 mg in sodium chloride  0.9 % 250 mL IVPB        1,250 mg 166.7 mL/hr over 90 Minutes Intravenous Every 24 hours 05/22/23 1252     05/22/23 1800  ceFEPIme (MAXIPIME)  2 g in sodium chloride  0.9 % 100 mL IVPB  Status:  Discontinued        2 g 200 mL/hr over 30 Minutes Intravenous Every 8 hours 05/22/23 1252 05/22/23 1528   05/22/23 1700  piperacillin-tazobactam (ZOSYN) IVPB 3.375 g        3.375 g 12.5 mL/hr over 240 Minutes Intravenous Every 8 hours 05/22/23 1544     05/22/23 1300  vancomycin  (VANCOCIN ) IVPB 1000 mg/200 mL premix        1,000 mg 200 mL/hr over 60 Minutes Intravenous  Once 05/22/23 1252 05/22/23 1538   05/22/23 1100  vancomycin  (VANCOCIN ) IVPB 1000 mg/200 mL premix        1,000 mg 200 mL/hr over 60 Minutes Intravenous Every 2 hours while awake 05/22/23 1028 05/22/23 1559   05/22/23 1030  piperacillin-tazobactam (ZOSYN) IVPB 3.375 g        3.375 g 100 mL/hr over 30 Minutes Intravenous  Once 05/22/23 1028 05/22/23 1116       Procedures: 05-22-2023 chest tube insertion 05-22-2023 pleural fibrinolysis #1 05-23-2023 pleural fibrinolysis #2 05-24-2023 pleural fibrinolysis #3 05-25-2023 chest tube removed.  Consultants: PCCM

## 2023-05-23 NOTE — Telephone Encounter (Signed)
 Chart reviewed.  Patient is in the hospital and was seen by Dr. Felton Hough of pulmonary critical care.  This a follow-up with our nurse practitioner in July

## 2023-05-23 NOTE — Evaluation (Signed)
 Occupational Therapy Evaluation Patient Details Name: Cody Tate MRN: [id number] DOB: Aug 15, 1950 Today's Date: 05/23/2023   History of Present Illness   73 y.o. male being admitted to the hospital with acute hypoxic respiratory failure and sepsis. Past medical history significant for COPD on room air, hyperlipidemia, CAD, hypertension recent outpatient treatment for COPD exacerbation and suspected pneumonia     Clinical Impressions Prior to hospital admission, pt was using 4WW for mobility (scooter for longer distances), requires assist from spouse for dressing and admits several "close calls" when asked about falls. Chart review indicates 5-6 falls in the last 6 months from OPPT notes. Pt currently requires MAX A for LB ADLs, stands 2x attempts from recliner with MIN A progressing to CGA (+2 for line/lead mgmt), and anticipate MIN A for t/f bariatric BSC for toileting needs. HR elevated to 124 with standing efforts, pt presents with decreased tolerance to activity, generalized weakness, impaired balance and increased SOB/WOB with mobility.   Pt would benefit from skilled OT services to address noted impairments and functional limitations (see below for any additional details) in order to maximize safety and independence while minimizing falls risk and caregiver burden. Anticipate the need for follow up OT services upon acute hospital DC.      If plan is discharge home, recommend the following:   A little help with walking and/or transfers;A little help with bathing/dressing/bathroom;Assistance with cooking/housework;Assist for transportation;Help with stairs or ramp for entrance     Functional Status Assessment   Patient has had a recent decline in their functional status and demonstrates the ability to make significant improvements in function in a reasonable and predictable amount of time.     Equipment Recommendations   None recommended by OT       Precautions/Restrictions   Precautions Precautions: Fall Recall of Precautions/Restrictions: Intact Precaution/Restrictions Comments: pt reports multiple "close calls", but denies falls in past 6 months; OPPT notes from March report 5-6 falls in past 6 months Restrictions Weight Bearing Restrictions Per Provider Order: No     Mobility Bed Mobility               General bed mobility comments: up in recliner start and end of session    Transfers Overall transfer level: Needs assistance Equipment used: Rolling walker (2 wheels) Transfers: Sit to/from Stand Sit to Stand: Min assist, Contact guard assist           General transfer comment: 2x STS, tolerated 30 sec first stand attempt, 1 min on second attempt      Balance Overall balance assessment: Needs assistance, History of Falls Sitting-balance support: Feet supported, No upper extremity supported Sitting balance-Leahy Scale: Fair     Standing balance support: During functional activity, Bilateral upper extremity supported, Reliant on assistive device for balance Standing balance-Leahy Scale: Poor Standing balance comment: limited tolerance due to dyspnea                           ADL either performed or assessed with clinical judgement   ADL Overall ADL's : Needs assistance/impaired Eating/Feeding: Independent;Sitting   Grooming: Sitting;Set up   Upper Body Bathing: Sitting;Minimal assistance Upper Body Bathing Details (indicate cue type and reason): anticipate minA due to body habitus and fatigue Lower Body Bathing: Maximal assistance;Sit to/from stand   Upper Body Dressing : Minimal assistance;Sitting   Lower Body Dressing: Minimal assistance;Sit to/from stand   Toilet Transfer: Minimal assistance;BSC/3in1;Rolling walker (2 wheels);Requires wide/bariatric Statistician  Details (indicate cue type and reason): limited tolerance to walking; will require use of bari [facility] Toileting-  Clothing Manipulation and Hygiene: Maximal assistance Toileting - Clothing Manipulation Details (indicate cue type and reason): secondary to body habitus and limited activity tolerance     Functional mobility during ADLs: Contact guard assist;Minimal assistance;Rolling walker (2 wheels);Cueing for sequencing;Cueing for safety       Vision Baseline Vision/History: 1 Wears glasses Ability to See in Adequate Light: 0 Adequate Patient Visual Report: No change from baseline              Pertinent Vitals/Pain Pain Assessment Pain Assessment: 0-10 Pain Score: 10-Worst pain ever Pain Location: L side and back Pain Descriptors / Indicators: Grimacing, Sore Pain Intervention(s): Limited activity within patient's tolerance, Monitored during session, Repositioned (pt scheduled to pain meds per discussion with RN, pt agreeable to perform mobility)     Extremity/Trunk Assessment Upper Extremity Assessment Upper Extremity Assessment: Right hand dominant;Generalized weakness   Lower Extremity Assessment Lower Extremity Assessment: Defer to PT evaluation (significant edema present to BLEs, left elevated in recliner) RLE Deficits / Details: significant edema noted BLEs, knee ext 4/5 RLE Sensation: decreased light touch LLE Deficits / Details: significant edema noted BLEs, knee ext 4/5 LLE Sensation: decreased light touch   Cervical / Trunk Assessment Cervical / Trunk Assessment: Normal;Back Surgery   Communication Communication Communication: Impaired Factors Affecting Communication: Hearing impaired   Cognition Arousal: Alert Behavior During Therapy: WFL for tasks assessed/performed               OT - Cognition Comments: decreased insight into deficits and situation                 Following commands: Intact       Cueing  General Comments   Cueing Techniques: Verbal cues  BLE edema. Pt recieved on 4L O2, removed for RA trial, SpO2 98% on RA, 95% on RA with  marching in place. Pt with DOE/WOB limiting further activity. O2 via [place] donned end of session at pt request for comfort.           Home Living Family/patient expects to be discharged to:: Private residence Living Arrangements: Spouse/significant other Available Help at Discharge: Family Type of Home: House Home Access: Stairs to enter Secretary/administrator of Steps: 3 Entrance Stairs-Rails: Left;Can reach both;Right Home Layout: One level     Bathroom Shower/Tub: Producer, television/film/video: Handicapped height     Home Equipment: Rollator (4 wheels);Wheelchair - power;Shower seat - built in;Grab bars - tub/shower          Prior Functioning/Environment Prior Level of Function : Needs assist;History of Falls (last six months)       Physical Assist : ADLs (physical)   ADLs (physical): Dressing;Toileting;IADLs Mobility Comments: uses rollator, denies falls in past 6 months but reports poor balance and several "close calls". OPPT note indicates 5-6 in past 6 mo (pt discharged from OPPT in March) ADLs Comments: spouse assists with dressing, pt states he showers sitting down    OT Problem List: Decreased strength;Decreased range of motion;Decreased activity tolerance;Impaired balance (sitting and/or standing);Decreased safety awareness;Cardiopulmonary status limiting activity;Decreased knowledge of precautions;Decreased knowledge of use of DME or AE;Obesity;Pain   OT Treatment/Interventions: Self-care/ADL training;Neuromuscular education;Energy conservation;DME and/or AE instruction;Therapeutic activities;Patient/family education;Balance training      OT Goals(Current goals can be found in the care plan section)   Acute Rehab OT Goals OT Goal Formulation: With patient/family Time For Goal Achievement: 06/06/23  Potential to Achieve Goals: Fair   OT Frequency:  Min 2X/week    Co-evaluation PT/OT/SLP Co-Evaluation/Treatment: Yes Reason for Co-Treatment: For  patient/therapist safety;To address functional/ADL transfers PT goals addressed during session: Mobility/safety with mobility;Balance;Proper use of DME OT goals addressed during session: ADL's and self-care      AM-PAC OT "6 Clicks" Daily Activity     Outcome Measure Help from another person eating meals?: None Help from another person taking care of personal grooming?: None Help from another person toileting, which includes using toliet, bedpan, or urinal?: A Lot Help from another person bathing (including washing, rinsing, drying)?: A Lot Help from another person to put on and taking off regular upper body clothing?: A Little Help from another person to put on and taking off regular lower body clothing?: A Lot 6 Click Score: 17   End of Session Equipment Utilized During Treatment: Gait belt;Rolling walker (2 wheels);Oxygen Nurse Communication: Mobility status  Activity Tolerance: Patient tolerated treatment well (limited to dyspnea) Patient left: in chair;with call bell/phone within reach;with chair alarm set;with family/visitor present  OT Visit Diagnosis: Repeated falls (R29.6);Muscle weakness (generalized) (M62.81);Unsteadiness on feet (R26.81);Pain Pain - Right/Left:  (LBP) Pain - part of body:  (back)                Time: 0865-7846 OT Time Calculation (min): 23 min Charges:  OT General Charges $OT Visit: 1 Visit OT Evaluation $OT Eval Moderate Complexity: 1 Mod  Mignonne Afonso L. Halee Glynn, OTR/L  05/23/23, 1:12 PM

## 2023-05-23 NOTE — Progress Notes (Signed)
 PROGRESS NOTE    Cody Tate  VOZ:[id number] DOB: 29-Dec-1950 DOA: 05/22/2023 PCP: Benedetta Bradley, MD  Subjective: Pt seen and examined. Met with pt and his wife. Pt had left side pigtail catheter placed yesterday by PCCM and given his 1st dose of pleural lytics yesterday. He already had more than 2 liter output. Had his 2nd dose of pleural lytics this AM. RN waiting to change out chest tube atrium.  He remains on IV ABX with zosyn/vanco. On 4 L/min O2. He does not wear home O2.   Hospital Course: HPI: Cody Tate is a 73 y.o. male with medical history significant for COPD on room air, hyperlipidemia, CAD, hypertension recent outpatient treatment for COPD exacerbation and suspected pneumonia being admitted to the hospital with acute hypoxic respiratory failure and sepsis.  He was recently treated by pulmonology as an outpatient with courses of p.o. Levaquin  and doxycycline , he seemed to improve overall and was stable on room air.  Starting about 3 days ago, he was intermittently agitated but much according to his wife, seem to be slightly confused yesterday.  Also yesterday, he started having a frothy cough and feeling short of breath.  He had a temperature of 101 yesterday.  This morning, he also seemed a little bit lethargic/confused, not really answering the questions appropriately but this was somewhat transient.  Patient and wife deny any history of nausea, vomiting, chest pain, diarrhea or other concerns.   Significant Events: Admitted 05/22/2023 for acute hypoxic respiratory failure   Admission Labs: Lactic acid 3.0 Na 135, K 3.2, CO2 of 23, BUN 16, Scr 1.15, glu 108 WBC 30.4, HgB 15.9, plt 451, >20% bands BNP 165 Procal 8.7  Admission Imaging Studies: CXR Moderate left pleural effusion with opacification of the majority of the left mid to lower lung field  Significant Labs: Pleural fluid analysis. Fluid LDH 1017, protein 3.9, gluc <20, >10K nucleated cells. 63%  neutrophils Strep Pneumo antigen Negative  Significant Imaging Studies:   Antibiotic Therapy: Anti-infectives (From admission, onward)    Start     Dose/Rate Route Frequency Ordered Stop   05/23/23 1400  Vancomycin  (VANCOCIN ) 1,250 mg in sodium chloride  0.9 % 250 mL IVPB        1,250 mg 166.7 mL/hr over 90 Minutes Intravenous Every 24 hours 05/22/23 1252     05/22/23 1800  ceFEPIme (MAXIPIME) 2 g in sodium chloride  0.9 % 100 mL IVPB  Status:  Discontinued        2 g 200 mL/hr over 30 Minutes Intravenous Every 8 hours 05/22/23 1252 05/22/23 1528   05/22/23 1700  piperacillin-tazobactam (ZOSYN) IVPB 3.375 g        3.375 g 12.5 mL/hr over 240 Minutes Intravenous Every 8 hours 05/22/23 1544     05/22/23 1300  vancomycin  (VANCOCIN ) IVPB 1000 mg/200 mL premix        1,000 mg 200 mL/hr over 60 Minutes Intravenous  Once 05/22/23 1252 05/22/23 1538   05/22/23 1100  vancomycin  (VANCOCIN ) IVPB 1000 mg/200 mL premix        1,000 mg 200 mL/hr over 60 Minutes Intravenous Every 2 hours while awake 05/22/23 1028 05/22/23 1559   05/22/23 1030  piperacillin-tazobactam (ZOSYN) IVPB 3.375 g        3.375 g 100 mL/hr over 30 Minutes Intravenous  Once 05/22/23 1028 05/22/23 1116       Procedures: 05-22-2023 chest tube insertion 05-22-2023 pleural fibrinolysis #1 05-23-2023 pleural fibrinolysis #2  Consultants: PCCM  Assessment and Plan: * Sepsis with acute organ dysfunction without septic shock (HCC) On admission. meeting criteria with leukocytosis, tachycardia, fever 101 at home.  Source is suspected community-acquired pneumonia/empyema.  Chest x-ray as above with moderate left pleural effusion. -Inpatient admission to progressive -Follow-up blood cultures -Empiric broad-spectrum IV antibiotics  05-23-2023 not hypotensive. WBC still 36K. Awaiting blood, sputum, pleural fluid cultures. On Zosyn/Vanco.  Parapneumonic effusion 05-23-2023 left pigtail chest tube inserted yesterday. On Day  #2 of pleural lytics. Already has had out at least 2 liters from chest tube.  CAP (community acquired pneumonia) 05-23-2023 not hypotensive. WBC still 36K. Awaiting blood, sputum, pleural fluid cultures. On Zosyn/Vanco.   Acute respiratory failure with hypoxia (HCC) 05-23-2023 due to pneumonia and effusion. He is not on home O2. Continue with supplemental O2.  Swelling of lower limb 05-23-2023 chronic per pt and wife. Although wife states right leg is usually not this swollen.  Opioid dependence (HCC) 05-23-2023 has valid Rx for oxycodone  10 mg q4h prn at home.  Obesity, Class II, BMI 35-39.9 Estimated body mass index is 36.38 kg/m as calculated from the following:   Height as of 05/16/23: 5' 6.5" (1.689 m).   Weight as of 05/16/23: 103.8 kg.    Essential hypertension 05-23-2023 holding HTN meds due to sepsis.  Hypokalemia 05-23-2023 repleted. K 4.1 today. Resolved.  Acute hyponatremia 05-23-2023 Na 127 today. Was 135 yesterday. He received IVF with LR yesterday. He did not get any diuretics.  Will repeat BMP tomorrow. At this point, I think this is just a spurious lab value.  DVT prophylaxis: enoxaparin (LOVENOX) injection 40 mg Start: 05/22/23 2200    Code Status: Full Code Family Communication: discussed with wife at bedside Disposition Plan: return home Reason for continuing need for hospitalization: remains with chest tube, on IV abx, getting pleural lytics.  Objective: Vitals:   05/22/23 2028 05/23/23 0125 05/23/23 0510 05/23/23 0803  BP: (!) 120/56 (!) 111/56 (!) 94/44   Pulse: 99 93 89   Resp: 20 19 18    Temp: 98.6 F (37 C) 97.8 F (36.6 C) (!) 97.5 F (36.4 C)   TempSrc: Oral Oral Oral   SpO2: 98% 97% 99% 97%    Intake/Output Summary (Last 24 hours) at 05/23/2023 1150 Last data filed at 05/23/2023 0900 Gross per 24 hour  Intake 2844.37 ml  Output 1900 ml  Net 944.37 ml   There were no vitals filed for this visit.  Examination:  Physical  Exam Vitals and nursing note reviewed.  Constitutional:      General: He is not in acute distress.    Appearance: He is obese. He is not toxic-appearing.  HENT:     Head: Normocephalic and atraumatic.     Nose: Nose normal.  Cardiovascular:     Rate and Rhythm: Normal rate and regular rhythm.  Pulmonary:     Effort: Pulmonary effort is normal. No respiratory distress.     Comments: Left side pigtail chest tube. Yellow drainage with particulate/stringy material in chest tube tubing. Abdominal:     General: Abdomen is protuberant. Bowel sounds are normal. There is no distension.     Palpations: Abdomen is soft.  Musculoskeletal:     Right lower leg: 3+ Edema present.     Left lower leg: 3+ Edema present.  Skin:    Capillary Refill: Capillary refill takes less than 2 seconds.  Neurological:     General: No focal deficit present.     Mental Status: He is alert  and oriented to person, place, and time.     Data Reviewed: I have personally reviewed following labs and imaging studies  CBC: Recent Labs  Lab 05/22/23 1005 05/23/23 0419  WBC 30.4* 36.2*  NEUTROABS 26.2*  --   HGB 15.9 12.6*  HCT 48.0 40.6  MCV 90.2 94.6  PLT 451* 365   Basic Metabolic Panel: Recent Labs  Lab 05/22/23 1005 05/23/23 0419  NA 135 127*  K 3.2* 4.1  CL 99 93*  CO2 23 25  GLUCOSE 108* 109*  BUN 16 22  CREATININE 1.15 1.05  CALCIUM  8.5* 8.0*   GFR: Estimated Creatinine Clearance: 71.3 mL/min (by C-G formula based on SCr of 1.05 mg/dL). Liver Function Tests: Recent Labs  Lab 05/17/23 1146 05/22/23 1005  AST 17 37  ALT 14 36  ALKPHOS 208* 178*  BILITOT 1.2 1.6*  PROT 6.4 6.1*  ALBUMIN 3.6* 2.4*   Coagulation Profile: Recent Labs  Lab 05/22/23 1005  INR 1.2   BNP (last 3 results) Recent Labs    05/22/23 1005  BNP 165.8*   HbA1C: Recent Labs    05/23/23 0419  HGBA1C 5.3   CBG: Recent Labs  Lab 05/22/23 1622 05/22/23 2114 05/23/23 0736  GLUCAP 119* 134* 114*    Sepsis Labs: Recent Labs  Lab 05/22/23 1018 05/22/23 1420 05/22/23 1533 05/22/23 1813  PROCALCITON  --  8.71  --   --   LATICACIDVEN 3.0*  --  3.4* 3.6*    Recent Results (from the past 240 hours)  Culture, blood (Routine x 2)     Status: None (Preliminary result)   Collection Time: 05/22/23 10:05 AM   Specimen: BLOOD LEFT ARM  Result Value Ref Range Status   Specimen Description   Final    BLOOD LEFT ARM Performed at [facility], [address], [place], [place] [address]    Special Requests   Final    BOTTLES DRAWN AEROBIC AND ANAEROBIC Blood Culture results may not be optimal due to an inadequate volume of blood received in culture bottles Performed at [facility], [address] [place], [place] [address]    Culture   Final    NO GROWTH < 24 HOURS Performed at [facility], [address], [place], [place] [address]    Report Status PENDING  Incomplete  Culture, blood (Routine x 2)     Status: None (Preliminary result)   Collection Time: 05/22/23 10:09 AM   Specimen: BLOOD  Result Value Ref Range Status   Specimen Description   Final    BLOOD RIGHT ANTECUBITAL Performed at [facility], [address], [place], [place] [address]    Special Requests   Final    BOTTLES DRAWN AEROBIC AND ANAEROBIC Blood Culture results may not be optimal due to an inadequate volume of blood received in culture bottles Performed at [facility], [address] [place], [place] [address]    Culture   Final    NO GROWTH < 24 HOURS Performed at [facility], [address], [place], [place] [address]    Report Status PENDING  Incomplete  Body fluid culture w Gram Stain     Status: None (Preliminary result)   Collection Time: 05/22/23  2:49 PM   Specimen: Pleural Fluid  Result Value Ref Range Status   Specimen Description   Final    PLEURAL Performed at [facility], [address]  [place], [place] [address]    Special Requests   Final    NONE Performed at [facility], [address] [place], [place] [address]    Gram Stain   Final    WBC PRESENT, PREDOMINANTLY PMN NO ORGANISMS SEEN CYTOSPIN SMEAR    Culture   Final    NO GROWTH < 24 HOURS Performed at [facility], [address] [place], [place] [address]    Report Status PENDING  Incomplete  MRSA Next Gen by PCR, Nasal     Status: None   Collection Time: 05/22/23  5:35 PM   Specimen: Nasal Mucosa; Nasal Swab  Result Value Ref Range Status   MRSA by PCR Next Gen NOT DETECTED NOT DETECTED Final    Comment: (NOTE) The GeneXpert MRSA Assay (FDA approved for NASAL specimens only), is one component of a comprehensive MRSA colonization surveillance program. It is not intended to diagnose MRSA infection nor to guide or monitor treatment for MRSA infections. Test performance is not FDA approved in patients less than 16 years old. Performed at [facility], [address] [place], [place] [address]   Culture, Respiratory w Gram Stain     Status: None (Preliminary result)   Collection Time: 05/22/23  5:38 PM   Specimen: Tracheal Aspirate  Result Value Ref Range Status   Specimen Description   Final    TRACHEAL ASPIRATE Performed at [facility], [address] [place], [place] [address]    Special Requests   Final    NONE Performed at [facility], [address], [place], [place] [address]    Gram Stain   Final    ABUNDANT WBC PRESENT, PREDOMINANTLY PMN RARE GRAM POSITIVE COCCI RARE GRAM POSITIVE RODS    Culture   Final    NO GROWTH < 24 HOURS Performed at [facility], [address] [place], [place] [address]    Report Status PENDING  Incomplete     Radiology Studies: DG Chest Port 1 View Result Date: 05/23/2023 CLINICAL DATA:  Chest tube in place. EXAM: PORTABLE CHEST 1 VIEW COMPARISON:  May 22, 2023. FINDINGS: The  heart size and mediastinal contours are within normal limits. Right lung is clear. Stable left-sided pleural drainage catheter. Decreased loculated left pleural effusion is noted with small pneumothorax component. Left basilar atelectasis is noted. The visualized skeletal structures are unremarkable. IMPRESSION: Stable left-sided pleural drainage catheter. Loculated left hydropneumothorax is noted which is slightly decreased compared to prior exam. Electronically Signed   By: Rosalene Colon M.D.   On: 05/23/2023 07:53   DG CHEST PORT 1 VIEW Result Date: 05/22/2023 CLINICAL DATA:  Chest tube in place EXAM: PORTABLE CHEST 1 VIEW COMPARISON:  Chest x-ray 05/22/2023 FINDINGS: There is a moderate size left pleural effusion, unchanged. Questionable tiny left pneumothorax component seen near the apex. Focal opacity in the left mid and lower lung appears unchanged. There is a new left-sided chest tube in the lower left hemithorax. The right lung is clear. The cardiomediastinal silhouette is stable. Osseous structures are stable. IMPRESSION: 1. New left-sided chest tube in place. 2. Moderate size left pleural effusion, unchanged. Questionable tiny left pneumothorax component seen near the apex which may be related to chest tube placement. 3. Focal opacity in the left mid and lower lung appears unchanged. Electronically Signed   By: Tyron Gallon M.D.   On: 05/22/2023 16:54   DG Chest Port 1 View Result Date:  05/22/2023 CLINICAL DATA:  Sepsis. EXAM: PORTABLE CHEST 1 VIEW COMPARISON:  Chest radiograph dated 05/16/2023. FINDINGS: Interval development of a moderate left pleural effusion with opacification of the majority of the left mid to lower lung field. The right lung is clear. No pneumothorax. The cardiac silhouette. No acute osseous pathology. IMPRESSION: Moderate left pleural effusion with opacification of the majority of the left mid to lower lung field. Electronically Signed   By: Angus Bark M.D.   On:  05/22/2023 12:28    Scheduled Meds:  budesonide -glycopyrrolate -formoterol   2 puff Inhalation BID   enoxaparin (LOVENOX) injection  40 mg Subcutaneous Q24H   fluticasone   1 spray Each Nare Daily   insulin aspart  0-15 Units Subcutaneous TID WC   insulin aspart  0-5 Units Subcutaneous QHS   montelukast   10 mg Oral QHS   sodium chloride  flush  10 mL Intrapleural Q8H   Continuous Infusions:  piperacillin-tazobactam (ZOSYN)  IV 3.375 g (05/23/23 1111)   vancomycin        LOS: 1 day   Time spent: 60 minutes  Cody Garb, DO  [facility]  05/23/2023, 11:50 AM

## 2023-05-23 NOTE — Assessment & Plan Note (Addendum)
 05-23-2023 Na 127 today. Was 135 yesterday. He received IVF with LR yesterday. He did not get any diuretics.  Will repeat BMP tomorrow. At this point, I think this is just a spurious lab value.  05-24-2023 Na 130 today. Was 127 yesterday. Continue to monitor.  05-25-2023 Na up to 132. Resolved.

## 2023-05-23 NOTE — Assessment & Plan Note (Addendum)
 05-23-2023 due to pneumonia and effusion. He is not on home O2. Continue with supplemental O2.  05-24-2023 resolved. Pt has been weaned to RA.

## 2023-05-23 NOTE — Plan of Care (Signed)

## 2023-05-23 NOTE — Progress Notes (Addendum)
   NAME:  Cody Tate, MRN:  [id number], DOB:  [birth year], LOS: 1 ADMISSION DATE:  05/22/2023, CONSULTATION DATE:  05/22/23 REFERRING MD:  Jannette Mend, CHIEF COMPLAINT:  SOB   History of Present Illness:  73 year old man w/ hx of smoking, COPD, prior abnormal CT in 2024 with R sided necrotic masses that resolved with abx presenting with about 3 weeks of worsening SOB, cough with intermittent hemoptysis.  Has taken course of doxy then levaquin .  Imaging initially with rounded mass-like left consolidation now with loculated effusion associated.  PCCM consulted to assist with management.  Normally RA, currently high 80s sats on 5LPM.  Pertinent  Medical History   Past Medical History:  Diagnosis Date   Arthritis    Back pain    Radiates down both legs   BPH (benign prostatic hyperplasia)    Cancer (HCC) 2022   prostate cancer   COPD (chronic obstructive pulmonary disease) (HCC)    Dyspnea    Enlarged prostate    GERD (gastroesophageal reflux disease)    Glaucoma    H/O hiatal hernia    H/O wheezing    occ inhaler usage   Heart murmur    Hypertension    Lumbar foraminal stenosis    Macular degeneration    Meningioma (HCC) 2014   Treated with radiation   Neuromuscular disorder (HCC)    Neuropathy bilateral legs   PONV (postoperative nausea and vomiting)    Pulmonary nodules    resolved on 2019 follow up CT   S/P insertion of spinal cord stimulator      Significant Hospital Events: Including procedures, antibiotic start and stop dates in addition to other pertinent events   5/21 admit, pigtail  Interim History / Subjective:  2L out so far, lytics x 1 yesterday [name] is hurting from not being able to move Breathing improved  Objective    Blood pressure (!) 94/44, pulse 89, temperature (!) 97.5 F (36.4 C), temperature source Oral, resp. rate 18, SpO2 97%.        Intake/Output Summary (Last 24 hours) at 05/23/2023 0811 Last data filed at 05/23/2023 0305 Gross per 24  hour  Intake 2884.1 ml  Output 1900 ml  Net 984.1 ml   There were no vitals filed for this visit.  Examination: No distress A little better breath sounds on left Atrium and tubing are completely full of fluid, rapid response to change out Ext warm Chronic lymphedema Aox3  WBC up a bit Pct 8 Fluid looks inflammatory/infectious  Resolved problem list   Assessment and Plan  Sepsis secondary to likely left CAP with empyema formation- will also r/o malignancy but timing less consistent.  With hx of prior cavitary PNA probably need eval for either aspiration or immunodeficiency as OP.  - f/u fluid culture/cyto - another dose of lytics (x2 if we have time) - eventual CT scan - will need prolonged abx and OP fu imaging  Will follow Ardelle Kos MD PCCM

## 2023-05-23 NOTE — Subjective & Objective (Addendum)
 Pt seen and examined. Pt seen and examine. Discussed case with pt's outpatient pulmonologist Dr. Bertrum Brodie. He will see patient today.

## 2023-05-23 NOTE — Assessment & Plan Note (Addendum)
 05-23-2023 repleted. K 4.1 today. Resolved.  05-24-2023 K 3.3 today. Give more PO Kcl.  05-25-2023 resolved.

## 2023-05-23 NOTE — Procedures (Signed)
 Pleural Fibrinolytic Administration Procedure Note  Cody Tate  [id number]  February 16, 1950  Date:05/23/23  Time:9:32 AM   Provider Performing:Seydou Hearns C Felipe Horton   Procedure: Pleural Fibrinolysis Subsequent day [phone])  Indication(s) Fibrinolysis of complicated pleural effusion  Consent Risks of the procedure as well as the alternatives and risks of each were explained to the patient and/or caregiver.  Consent for the procedure was obtained.   Anesthesia None   Time Out Verified patient identification, verified procedure, site/side was marked, verified correct patient position, special equipment/implants available, medications/allergies/relevant history reviewed, required imaging and test results available.   Sterile Technique Hand hygiene, gloves   Procedure Description Existing pleural catheter was cleaned and accessed in sterile manner.  10mg  of tPA in 30cc of saline and 5mg  of dornase in 30cc of sterile water  were injected into pleural space using existing pleural catheter.  Catheter will be clamped for 1 hour and then placed back to suction.   Complications/Tolerance None; patient tolerated the procedure well.  EBL None   Specimen(s) None

## 2023-05-23 NOTE — Assessment & Plan Note (Addendum)
 05-23-2023 chronic per pt and wife. Although wife states right leg is usually not this swollen.  05-24-2023 chronic.  05-25-2023 stable. Chronic.  05-26-2023 stable. Restarted lasix  20 mg yesterday  05-27-2023 increase lasix  to 20 mg bid.  05-28-2023 DC to home with lasix  20 mg bid x 7 days then decrease to 20 mg daily.

## 2023-05-23 NOTE — Assessment & Plan Note (Signed)
 Estimated body mass index is 36.38 kg/m as calculated from the following:   Height as of 05/16/23: 5' 6.5" (1.689 m).   Weight as of 05/16/23: 103.8 kg.

## 2023-05-23 NOTE — Assessment & Plan Note (Addendum)
 On admission. meeting criteria with leukocytosis, tachycardia, fever 101 at home.  Source is suspected community-acquired pneumonia/empyema.  Chest x-ray as above with moderate left pleural effusion. -Inpatient admission to progressive -Follow-up blood cultures -Empiric broad-spectrum IV antibiotics  05-23-2023 not hypotensive. WBC still 36K. Awaiting blood, sputum, pleural fluid cultures. On Zosyn/Vanco.  05-25-2023 WBC down to 18. BP normal. No fevers. Sepsis is resolved.

## 2023-05-23 NOTE — Progress Notes (Signed)
   05/23/23 1333  Assess: MEWS Score  Temp 97.9 F (36.6 C)  BP (!) 142/54  MAP (mmHg) 77  Pulse Rate (!) 113  ECG Heart Rate (!) 112  Resp (!) 24  SpO2 98 %  O2 Device Nasal Cannula  O2 Flow Rate (L/min) 3 L/min  Assess: MEWS Score  MEWS Temp 0  MEWS Systolic 0  MEWS Pulse 2  MEWS RR 1  MEWS LOC 0  MEWS Score 3  MEWS Score Color Yellow  Assess: if the MEWS score is Yellow or Red  Were vital signs accurate and taken at a resting state? Yes  Does the patient meet 2 or more of the SIRS criteria? Yes  Does the patient have a confirmed or suspected source of infection? Yes  MEWS guidelines implemented  Yes, yellow  Treat  MEWS Interventions Considered administering scheduled or prn medications/treatments as ordered  Take Vital Signs  Increase Vital Sign Frequency  Yellow: Q2hr x1, continue Q4hrs until patient remains green for 12hrs  Escalate  MEWS: Escalate Yellow: Discuss with charge nurse and consider notifying provider and/or RRT  Notify: Charge Nurse/RN  Name of Charge Nurse/RN Notified [facility] RN  Provider Notification  Provider Name/Title Dr Farrel Hones  Date Provider Notified 05/23/23  Time Provider Notified 1415  Method of Notification  (secure chat)  Notification Reason Change in status  Provider response No new orders  Date of Provider Response 05/23/23  Time of Provider Response 1420  Assess: SIRS CRITERIA  SIRS Temperature  0  SIRS Respirations  1  SIRS Pulse 1  SIRS WBC 1  SIRS Score Sum  3

## 2023-05-23 NOTE — Evaluation (Signed)
 Physical Therapy Evaluation Patient Details Name: Cody Tate MRN: [id number] DOB: Oct 26, 1950 Today's Date: 05/23/2023  History of Present Illness  73 y.o. male being admitted to the hospital with acute hypoxic respiratory failure and sepsis. Past medical history significant for COPD on room air, hyperlipidemia, CAD, hypertension recent outpatient treatment for COPD exacerbation and suspected pneumonia  Clinical Impression  Pt admitted with above diagnosis. Pt reports he uses a rollator in the home and motorized scooter for longer distances. He reports multiple "close calls" regarding falls but denied falls in past 6 months, outpatient PT notes from March indicate pt had 5-6 falls in past 6 months. Today pt stood and marched in place with RW for 30 seconds and 60 seconds, HR 124 with activity, SpO2 95% on room air with activity, activity tolerance limited by 3/4 dyspnea. Patient will benefit from continued inpatient follow up therapy, <3 hours/day  Pt currently with functional limitations due to the deficits listed below (see PT Problem List). Pt will benefit from acute skilled PT to increase their independence and safety with mobility to allow discharge.           If plan is discharge home, recommend the following: A lot of help with walking and/or transfers;A lot of help with bathing/dressing/bathroom;Assistance with cooking/housework;Assist for transportation;Help with stairs or ramp for entrance   Can travel by private vehicle   No    Equipment Recommendations None recommended by PT  Recommendations for Other Services       Functional Status Assessment Patient has had a recent decline in their functional status and demonstrates the ability to make significant improvements in function in a reasonable and predictable amount of time.     Precautions / Restrictions Precautions Precautions: Fall Recall of Precautions/Restrictions: Intact Precaution/Restrictions Comments: pt reports  multiple "close calls", but denies falls in past 6 months; OPPT notes from March report 5-6 falls in past 6 months Restrictions Weight Bearing Restrictions Per Provider Order: No      Mobility  Bed Mobility               General bed mobility comments: up in recliner    Transfers Overall transfer level: Needs assistance Equipment used: Rolling walker (2 wheels) Transfers: Sit to/from Stand Sit to Stand: Min assist, Contact guard assist           General transfer comment: VCs for hand placement, sit to stand x 2 trials with RW, min/guard assist; pt stood and marched in place for 30 seconds + 60 seconds, SpO2 95% on room air with activity, HR 124 with activity; standing tolerance limited by 3/4 dyspnea    Ambulation/Gait               General Gait Details: deferred 2* fatigue/dyspnea with standing  Stairs            Wheelchair Mobility     Tilt Bed    Modified Rankin (Stroke Patients Only)       Balance Overall balance assessment: Needs assistance, History of Falls Sitting-balance support: Feet supported, No upper extremity supported Sitting balance-Leahy Scale: Fair     Standing balance support: During functional activity, Bilateral upper extremity supported, Reliant on assistive device for balance Standing balance-Leahy Scale: Poor                               Pertinent Vitals/Pain Pain Assessment Pain Assessment: 0-10 Pain Score: 10-Worst pain ever Pain Location:  L side and back Pain Descriptors / Indicators: Grimacing, Sore Pain Intervention(s): Limited activity within patient's tolerance, Monitored during session, Repositioned, Patient requesting pain meds-RN notified    Home Living Family/patient expects to be discharged to:: Private residence Living Arrangements: Spouse/significant other Available Help at Discharge: Family Type of Home: House Home Access: Stairs to enter Entrance Stairs-Rails: Left;Can reach  Therapist, art of Steps: 3   Home Layout: One level Home Equipment: Rollator (4 wheels);Wheelchair - power;Shower seat - built in;Grab bars - tub/shower      Prior Function Prior Level of Function : Independent/Modified Independent             Mobility Comments: uses rollator, denies falls in past 6 months but reports poor balance and several "close calls"       Extremity/Trunk Assessment   Upper Extremity Assessment Upper Extremity Assessment: Defer to OT evaluation    Lower Extremity Assessment Lower Extremity Assessment: RLE deficits/detail;LLE deficits/detail RLE Deficits / Details: significant edema noted BLEs, knee ext 4/5 RLE Sensation: decreased light touch LLE Deficits / Details: significant edema noted BLEs, knee ext 4/5 LLE Sensation: decreased light touch    Cervical / Trunk Assessment Cervical / Trunk Assessment: Normal;Back Surgery  Communication   Communication Communication: No apparent difficulties    Cognition Arousal: Alert Behavior During Therapy: WFL for tasks assessed/performed   PT - Cognitive impairments: No apparent impairments                         Following commands: Intact       Cueing       General Comments      Exercises     Assessment/Plan    PT Assessment Patient needs continued PT services  PT Problem List Decreased activity tolerance;Decreased balance;Decreased mobility;Pain;Impaired sensation;Obesity       PT Treatment Interventions Gait training;Therapeutic exercise;Therapeutic activities;Functional mobility training;Patient/family education;Balance training    PT Goals (Current goals can be found in the Care Plan section)  Acute Rehab PT Goals Patient Stated Goal: to be able to walk PT Goal Formulation: With patient Time For Goal Achievement: 05/30/23 Potential to Achieve Goals: Fair    Frequency Min 3X/week     Co-evaluation PT/OT/SLP Co-Evaluation/Treatment: Yes Reason  for Co-Treatment: For patient/therapist safety;To address functional/ADL transfers PT goals addressed during session: Mobility/safety with mobility;Balance;Proper use of DME         AM-PAC PT "6 Clicks" Mobility  Outcome Measure Help needed turning from your back to your side while in a flat bed without using bedrails?: A Little Help needed moving from lying on your back to sitting on the side of a flat bed without using bedrails?: A Lot Help needed moving to and from a bed to a chair (including a wheelchair)?: A Little Help needed standing up from a chair using your arms (e.g., wheelchair or bedside chair)?: A Little Help needed to walk in hospital room?: A Lot Help needed climbing 3-5 steps with a railing? : Total 6 Click Score: 14    End of Session Equipment Utilized During Treatment: Gait belt Activity Tolerance: Patient limited by fatigue Patient left: in chair;with call bell/phone within reach;with family/visitor present Nurse Communication: Mobility status PT Visit Diagnosis: Difficulty in walking, not elsewhere classified (R26.2);Pain;History of falling (Z91.81);Unsteadiness on feet (R26.81)    Time: 4401-0272 PT Time Calculation (min) (ACUTE ONLY): 22 min   Charges:   PT Evaluation $PT Eval Moderate Complexity: 1 Mod   PT General Charges $$  ACUTE PT VISIT: 1 Visit        [name] PT 05/23/2023  Acute Rehabilitation Services  Office [phone]

## 2023-05-24 ENCOUNTER — Inpatient Hospital Stay (HOSPITAL_COMMUNITY)

## 2023-05-24 DIAGNOSIS — J189 Pneumonia, unspecified organism: Secondary | ICD-10-CM | POA: Diagnosis not present

## 2023-05-24 DIAGNOSIS — J869 Pyothorax without fistula: Secondary | ICD-10-CM | POA: Diagnosis not present

## 2023-05-24 DIAGNOSIS — E871 Hypo-osmolality and hyponatremia: Secondary | ICD-10-CM | POA: Diagnosis not present

## 2023-05-24 DIAGNOSIS — E876 Hypokalemia: Secondary | ICD-10-CM

## 2023-05-24 DIAGNOSIS — I10 Essential (primary) hypertension: Secondary | ICD-10-CM | POA: Diagnosis not present

## 2023-05-24 DIAGNOSIS — E66812 Obesity, class 2: Secondary | ICD-10-CM | POA: Diagnosis not present

## 2023-05-24 LAB — CBC WITH DIFFERENTIAL/PLATELET
Abs Immature Granulocytes: 0.97 10*3/uL — ABNORMAL HIGH (ref 0.00–0.07)
Basophils Absolute: 0.1 10*3/uL (ref 0.0–0.1)
Basophils Relative: 1 %
Eosinophils Absolute: 0.2 10*3/uL (ref 0.0–0.5)
Eosinophils Relative: 1 %
HCT: 42 % (ref 39.0–52.0)
Hemoglobin: 13.3 g/dL (ref 13.0–17.0)
Immature Granulocytes: 4 %
Lymphocytes Relative: 3 %
Lymphs Abs: 0.9 10*3/uL (ref 0.7–4.0)
MCH: 29.2 pg (ref 26.0–34.0)
MCHC: 31.7 g/dL (ref 30.0–36.0)
MCV: 92.1 fL (ref 80.0–100.0)
Monocytes Absolute: 2.1 10*3/uL — ABNORMAL HIGH (ref 0.1–1.0)
Monocytes Relative: 8 %
Neutro Abs: 23.4 10*3/uL — ABNORMAL HIGH (ref 1.7–7.7)
Neutrophils Relative %: 83 %
Platelets: 355 10*3/uL (ref 150–400)
RBC: 4.56 MIL/uL (ref 4.22–5.81)
RDW: 13.2 % (ref 11.5–15.5)
Smear Review: NORMAL
WBC: 27.6 10*3/uL — ABNORMAL HIGH (ref 4.0–10.5)
nRBC: 0 % (ref 0.0–0.2)

## 2023-05-24 LAB — GLUCOSE, CAPILLARY
Glucose-Capillary: 120 mg/dL — ABNORMAL HIGH (ref 70–99)
Glucose-Capillary: 142 mg/dL — ABNORMAL HIGH (ref 70–99)
Glucose-Capillary: 113 mg/dL — ABNORMAL HIGH (ref 70–99)
Glucose-Capillary: 94 mg/dL (ref 70–99)

## 2023-05-24 LAB — COMPREHENSIVE METABOLIC PANEL WITH GFR
ALT: 33 U/L (ref 0–44)
AST: 44 U/L — ABNORMAL HIGH (ref 15–41)
Albumin: 1.9 g/dL — ABNORMAL LOW (ref 3.5–5.0)
Alkaline Phosphatase: 137 U/L — ABNORMAL HIGH (ref 38–126)
Anion gap: 10 (ref 5–15)
BUN: 27 mg/dL — ABNORMAL HIGH (ref 8–23)
CO2: 25 mmol/L (ref 22–32)
Calcium: 8.1 mg/dL — ABNORMAL LOW (ref 8.9–10.3)
Chloride: 95 mmol/L — ABNORMAL LOW (ref 98–111)
Creatinine, Ser: 0.7 mg/dL (ref 0.61–1.24)
GFR, Estimated: >60 mL/min (ref >60)
Glucose, Bld: 102 mg/dL — ABNORMAL HIGH (ref 70–99)
Potassium: 3.3 mmol/L — ABNORMAL LOW (ref 3.5–5.1)
Sodium: 130 mmol/L — ABNORMAL LOW (ref 135–145)
Total Bilirubin: 0.6 mg/dL (ref 0.0–1.2)
Total Protein: 4.9 g/dL — ABNORMAL LOW (ref 6.5–8.1)

## 2023-05-24 LAB — CYTOLOGY - NON PAP

## 2023-05-24 MED ORDER — VANCOMYCIN HCL 1750 MG/350ML IV SOLN: 1750.0000 mg | INTRAVENOUS | Status: DC

## 2023-05-24 MED ORDER — POTASSIUM CHLORIDE CRYS ER 20 MEQ PO TBCR
40.0000 meq | EXTENDED_RELEASE_TABLET | ORAL | Status: AC
  Administered 2023-05-24 (×2): 40 meq via ORAL
  Filled 2023-05-24 (×2): qty 2

## 2023-05-24 MED ORDER — BISACODYL 5 MG PO TBEC
20.0000 mg | DELAYED_RELEASE_TABLET | Freq: Once | ORAL | Status: AC
  Administered 2023-05-24: 20 mg via ORAL
  Filled 2023-05-24: qty 4

## 2023-05-24 MED ORDER — STERILE WATER FOR INJECTION IJ SOLN
5.0000 mg | Freq: Once | RESPIRATORY_TRACT | Status: AC
  Administered 2023-05-24: 5 mg via INTRAPLEURAL
  Filled 2023-05-24: qty 5

## 2023-05-24 MED ORDER — POLYETHYLENE GLYCOL 3350 17 G PO PACK
34.0000 g | PACK | Freq: Once | ORAL | Status: AC
  Administered 2023-05-24: 34 g via ORAL
  Filled 2023-05-24: qty 2

## 2023-05-24 MED ORDER — SODIUM CHLORIDE (PF) 0.9 % IJ SOLN
10.0000 mg | Freq: Once | INTRAMUSCULAR | Status: AC
  Administered 2023-05-24: 10 mg via INTRAPLEURAL
  Filled 2023-05-24: qty 10

## 2023-05-24 NOTE — TOC Initial Note (Incomplete Revision)
 Transition of Care [facility]) - Initial/Assessment Note    Patient Details  Name: Cody Tate MRN: [id number] Date of Birth: September 12, 1950  [facility] CM/SW Contact:    Gertha Ku, LCSW Phone Number: 05/24/2023, 11:21 AM  Clinical Narrative:                 CSW met with the pt and [name]. at the bedside. CSW discussed recommendations for SNF placement and explained the differences between home health services and SNF. The pt agreed to have his information sent out but is unsure whether he wants to go home or SNF.   CSW explained that the pt can review bed offers as they come in and then decide whether to go to a SNF or return home. The pt agreed to this plan. CSW will fax the pt's information for SNF placement. TOC to follow.    Adden  Beds presented    Expected Discharge Plan:  (TBD) Barriers to Discharge: Continued Medical Work up   Patient Goals and CMS Choice            Expected Discharge Plan and Services       Living arrangements for the past 2 months: Single Family Home                                      Prior Living Arrangements/Services Living arrangements for the past 2 months: Single Family Home Lives with:: Self, Spouse Patient language and need for interpreter reviewed:: Yes Do you feel safe going back to the place where you live?: Yes      Need for Family Participation in Patient Care: Yes (Comment)     Criminal Activity/Legal Involvement Pertinent to Current Situation/Hospitalization: No - Comment as needed  Activities of Daily Living   ADL Screening (condition at time of admission) Independently performs ADLs?: No Does the patient have a NEW difficulty with bathing/dressing/toileting/self-feeding that is expected to last >3 days?: Yes (Initiates electronic notice to provider for possible OT consult) Does the patient have a NEW difficulty with getting in/out of bed, walking, or climbing stairs that is  expected to last >3 days?: Yes (Initiates electronic notice to provider for possible PT consult) Does the patient have a NEW difficulty with communication that is expected to last >3 days?: No Is the patient deaf or have difficulty hearing?: No Does the patient have difficulty seeing, even when wearing glasses/contacts?: No Does the patient have difficulty concentrating, remembering, or making decisions?: No  Permission Sought/Granted                  Emotional Assessment Appearance:: Appears stated age Attitude/Demeanor/Rapport: Gracious Affect (typically observed): Accepting Orientation: : Oriented to Place, Oriented to  Time, Oriented to Situation, Oriented to Self   Psych Involvement: No (comment)  Admission diagnosis:  Sepsis (HCC) [A41.9] Sepsis, due to unspecified organism, unspecified whether acute organ dysfunction present [facility]) [A41.9] Patient Active Problem List   Diagnosis Date Noted   Parapneumonic effusion 05/23/2023   Acute respiratory failure with hypoxia (HCC) 05/23/2023   Acute hyponatremia 05/23/2023   Hypokalemia 05/23/2023   Sepsis with acute organ dysfunction without septic shock (HCC) 05/22/2023   CAP (community acquired pneumonia) 05/02/2023   Hemoptysis 05/02/2023   Mild mitral regurgitation 10/30/2022   Aortic valve stenosis 10/30/2022   COPD (chronic obstructive pulmonary disease) (HCC) 01/11/2022   GERD (gastroesophageal  reflux disease) 01/11/2022   Hypercholesterolemia 01/11/2022   Essential hypertension 01/11/2022   Obesity, Class II, BMI 35-39.9 01/11/2022   Other chronic pain 01/11/2022   Pulmonary nodule 01/11/2022   Lumbar foraminal stenosis 01/11/2022   Aortic ectasia (HCC) 01/11/2022   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   Malignant neoplasm of prostate (HCC) 07/12/2020   S/P insertion of spinal cord stimulator 01/16/2018   Back pain with history of spinal surgery 09/09/2017   Inflammation of sacroiliac joint  (HCC) 09/20/2016   Opioid dependence (HCC) 01/10/2016   Swelling of lower limb 03/25/2014   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   Complains of low back pain 09/09/2013   Lumbosacral radiculitis 12/03/2012   Spinal stenosis of lumbar region 10/30/2012   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   Lumbosacral spondylosis without myelopathy 10/23/2012   Dizziness 06/25/2012   Backache 06/25/2012   Benign neoplasm of cerebral meninges (HCC) 06/25/2012   Neck pain 06/25/2012   Lumbar stenosis with neurogenic claudication 05/08/2011   PCP:  Benedetta Bradley, MD Pharmacy:   [facility] [phone] - [place] [address], [place] - [address] AT [address] [place] [address] Phone: [phone] Fax: [phone]     Social Drivers of Health (SDOH) Social History: SDOH Screenings   Food Insecurity: No Food Insecurity (05/22/2023)  Housing: Low Risk  (05/22/2023)  Transportation Needs: No Transportation Needs (05/22/2023)  Utilities: Not At Risk (05/22/2023)  Social Connections: Moderately Integrated (05/22/2023)  Tobacco Use: Medium Risk (05/16/2023)   SDOH Interventions:     Readmission Risk Interventions     No data to display

## 2023-05-24 NOTE — Procedures (Signed)
 Pleural Fibrinolytic Administration Procedure Note  Cody Tate  [id number]  10/07/1950  Date:05/24/23  Time:4:07 PM   Provider Performing:Garnett Rekowski D. Harris   Procedure: Pleural Fibrinolysis Subsequent day (16606)  Indication(s) Fibrinolysis of complicated pleural effusion  Consent Risks of the procedure as well as the alternatives and risks of each were explained to the patient and/or caregiver.  Consent for the procedure was obtained.   Anesthesia None   Time Out Verified patient identification, verified procedure, site/side was marked, verified correct patient position, special equipment/implants available, medications/allergies/relevant history reviewed, required imaging and test results available.   Sterile Technique Hand hygiene, gloves   Procedure Description Existing pleural catheter was cleaned and accessed in sterile manner.  10mg  of tPA in 30cc of saline and 5mg  of dornase in 30cc of sterile water  were injected into pleural space using existing pleural catheter.  Catheter will be clamped for 1 hour and then placed back to suction.   Complications/Tolerance None; patient tolerated the procedure well.  EBL None   Specimen(s) None  Cody Tate D. Harris, NP-C [facility] Personal contact information can be found on Amion  If no contact or response made please call [phone] 05/24/2023, 4:08 PM

## 2023-05-24 NOTE — TOC Initial Note (Signed)
 Transition of Care [facility]) - Initial/Assessment Note    Patient Details  Name: Cody Tate MRN: [id number] Date of Birth: 06-27-1950  Transition of Care [facility]) CM/SW Contact:    Gertha Ku, LCSW Phone Number: 05/24/2023, 11:21 AM  Clinical Narrative:                 CSW met with the pt and [name]. at the bedside. CSW discussed recommendations for SNF placement and explained the differences between home health services and SNF. The pt agreed to have his information sent out but is unsure whether he wants to go home or SNF.   CSW explained that the pt can review bed offers as they come in and then decide whether to go to a SNF or return home. The pt agreed to this plan. CSW will fax the pt's information for SNF placement.   [facility] presented pt and family requesting time to review. TOC to follow.   Expected Discharge Plan:  (TBD) Barriers to Discharge: Continued Medical Work up   Patient Goals and CMS Choice            Expected Discharge Plan and Services       Living arrangements for the past 2 months: Single Family Home                                      Prior Living Arrangements/Services Living arrangements for the past 2 months: Single Family Home Lives with:: Self, Spouse Patient language and need for interpreter reviewed:: Yes Do you feel safe going back to the place where you live?: Yes      Need for Family Participation in Patient Care: Yes (Comment)     Criminal Activity/Legal Involvement Pertinent to Current Situation/Hospitalization: No - Comment as needed  Activities of Daily Living   ADL Screening (condition at time of admission) Independently performs ADLs?: No Does the patient have a NEW difficulty with bathing/dressing/toileting/self-feeding that is expected to last >3 days?: Yes (Initiates electronic notice to provider for possible OT consult) Does the patient have a NEW difficulty with getting in/out of  bed, walking, or climbing stairs that is expected to last >3 days?: Yes (Initiates electronic notice to provider for possible PT consult) Does the patient have a NEW difficulty with communication that is expected to last >3 days?: No Is the patient deaf or have difficulty hearing?: No Does the patient have difficulty seeing, even when wearing glasses/contacts?: No Does the patient have difficulty concentrating, remembering, or making decisions?: No  Permission Sought/Granted                  Emotional Assessment Appearance:: Appears stated age Attitude/Demeanor/Rapport: Gracious Affect (typically observed): Accepting Orientation: : Oriented to Place, Oriented to  Time, Oriented to Situation, Oriented to Self   Psych Involvement: No (comment)  Admission diagnosis:  Sepsis (HCC) [A41.9] Sepsis, due to unspecified organism, unspecified whether acute organ dysfunction present [facility]) [A41.9] Patient Active Problem List   Diagnosis Date Noted   Parapneumonic effusion 05/23/2023   Acute respiratory failure with hypoxia (HCC) 05/23/2023   Acute hyponatremia 05/23/2023   Hypokalemia 05/23/2023   Sepsis with acute organ dysfunction without septic shock (HCC) 05/22/2023   CAP (community acquired pneumonia) 05/02/2023   Hemoptysis 05/02/2023   Mild mitral regurgitation 10/30/2022   Aortic valve stenosis 10/30/2022   COPD (chronic obstructive pulmonary disease) (HCC)  01/11/2022   GERD (gastroesophageal reflux disease) 01/11/2022   Hypercholesterolemia 01/11/2022   Essential hypertension 01/11/2022   Obesity, Class II, BMI 35-39.9 01/11/2022   Other chronic pain 01/11/2022   Pulmonary nodule 01/11/2022   Lumbar foraminal stenosis 01/11/2022   Aortic ectasia (HCC) 01/11/2022   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   Malignant neoplasm of prostate (HCC) 07/12/2020   S/P insertion of spinal cord stimulator 01/16/2018   Back pain with history of spinal surgery  09/09/2017   Inflammation of sacroiliac joint (HCC) 09/20/2016   Opioid dependence (HCC) 01/10/2016   Swelling of lower limb 03/25/2014   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   Complains of low back pain 09/09/2013   Lumbosacral radiculitis 12/03/2012   Spinal stenosis of lumbar region 10/30/2012   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   Lumbosacral spondylosis without myelopathy 10/23/2012   Dizziness 06/25/2012   Backache 06/25/2012   Benign neoplasm of cerebral meninges (HCC) 06/25/2012   Neck pain 06/25/2012   Lumbar stenosis with neurogenic claudication 05/08/2011   PCP:  Benedetta Bradley, MD Pharmacy:   [facility] [phone] - [place], [place] - [address] AT [address] [place] [address] Phone: [phone] Fax: [phone]     Social Drivers of Health (SDOH) Social History: SDOH Screenings   Food Insecurity: No Food Insecurity (05/22/2023)  Housing: Low Risk  (05/22/2023)  Transportation Needs: No Transportation Needs (05/22/2023)  Utilities: Not At Risk (05/22/2023)  Social Connections: Moderately Integrated (05/22/2023)  Tobacco Use: Medium Risk (05/16/2023)   SDOH Interventions:     Readmission Risk Interventions     No data to display

## 2023-05-24 NOTE — NC FL2 (Signed)
 [place]  MEDICAID FL2 LEVEL OF CARE FORM     IDENTIFICATION  [name] Birthdate: [birth year] Sex: male Admission Date (Current Location): 05/22/2023  [facility] and [place] Number:  Producer, television/film/video and Address:  [facility],  [address] [place], [place] [address]      Provider Number: [id number]  Attending Physician Name and Address:  Unk Garb, DO  Relative Name and Phone Number:  Amadeo, Coke (Spouse)  [phone] (Mobile)    Current Level of Care: Hospital Recommended Level of Care: Skilled Nursing Facility Prior Approval Number:    Date Approved/Denied:   PASRR Number: [id number]  Discharge Plan: SNF    Current Diagnoses: Patient Active Problem List   Diagnosis Date Noted   Parapneumonic effusion 05/23/2023   Acute respiratory failure with hypoxia (HCC) 05/23/2023   Acute hyponatremia 05/23/2023   Hypokalemia 05/23/2023   Sepsis with acute organ dysfunction without septic shock (HCC) 05/22/2023   CAP (community acquired pneumonia) 05/02/2023   Hemoptysis 05/02/2023   Mild mitral regurgitation 10/30/2022   Aortic valve stenosis 10/30/2022   COPD (chronic obstructive pulmonary disease) (HCC) 01/11/2022   GERD (gastroesophageal reflux disease) 01/11/2022   Hypercholesterolemia 01/11/2022   Essential hypertension 01/11/2022   Obesity, Class II, BMI 35-39.9 01/11/2022   Other chronic pain 01/11/2022   Pulmonary nodule 01/11/2022   Lumbar foraminal stenosis 01/11/2022   Aortic ectasia (HCC) 01/11/2022   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   Malignant neoplasm of prostate (HCC) 07/12/2020   S/P insertion of spinal cord stimulator 01/16/2018   Back pain with history of spinal surgery 09/09/2017   Inflammation of sacroiliac joint (HCC) 09/20/2016   Opioid dependence (HCC) 01/10/2016   Swelling of lower limb 03/25/2014   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   Complains of low back pain  09/09/2013   Lumbosacral radiculitis 12/03/2012   Spinal stenosis of lumbar region 10/30/2012   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   Lumbosacral spondylosis without myelopathy 10/23/2012   Dizziness 06/25/2012   Backache 06/25/2012   Benign neoplasm of cerebral meninges (HCC) 06/25/2012   Neck pain 06/25/2012   Lumbar stenosis with neurogenic claudication 05/08/2011    Orientation RESPIRATION BLADDER Height & Weight     Self, Situation, Time, Place  Normal External catheter Weight:   Height:     BEHAVIORAL SYMPTOMS/MOOD NEUROLOGICAL BOWEL NUTRITION STATUS      Continent Diet (carb mod)  AMBULATORY STATUS COMMUNICATION OF NEEDS Skin   Limited Assist Verbally Normal                       Personal Care Assistance Level of Assistance  Bathing, Feeding, Dressing Bathing Assistance: Limited assistance Feeding assistance: Independent Dressing Assistance: Limited assistance     Functional Limitations Info  Sight, Hearing, Speech Sight Info: Impaired (glasses) Hearing Info: Adequate Speech Info: Adequate    SPECIAL CARE FACTORS FREQUENCY  OT (By licensed OT), PT (By licensed PT)     PT Frequency: 5 x a week OT Frequency: 5 x a week            Contractures Contractures Info: Not present    Additional Factors Info  Code Status, Allergies, Insulin Sliding Scale Code Status Info: full Allergies Info: Amlodipine Besylate  Aspirin   Other  Tape  Wound Dressing Adhesive   Insulin Sliding Scale Info: see d/c summary       Current Medications (05/24/2023):  This is the current hospital active medication list Current Facility-Administered  Medications  Medication Dose Route Frequency Provider Last Rate Last Admin   acetaminophen  (TYLENOL ) tablet 650 mg  650 mg Oral Q6H WA Josiah Nigh, MD   650 mg at 05/24/23 1610   budesonide -glycopyrrolate -formoterol  (BREZTRI ) 160-9-4.8 MCG/ACT inhaler 2 puff  2 puff Inhalation BID Gaylin Ke, MD   2 puff at 05/24/23  0845   enoxaparin (LOVENOX) injection 40 mg  40 mg Subcutaneous Q24H Jannette Mend, Mir M, MD   40 mg at 05/23/23 2202   fluticasone  (FLONASE ) 50 MCG/ACT nasal spray 1 spray  1 spray Each Nare Daily Chavez, Abigail, NP   1 spray at 05/24/23 0910   insulin aspart (novoLOG) injection 0-15 Units  0-15 Units Subcutaneous TID WC Jannette Mend, Mir M, MD   2 Units at 05/23/23 1229   insulin aspart (novoLOG) injection 0-5 Units  0-5 Units Subcutaneous QHS Jannette Mend, Mir M, MD       ipratropium-albuterol  (DUONEB) 0.5-2.5 (3) MG/3ML nebulizer solution 3 mL  3 mL Inhalation Q6H PRN Jannette Mend, Mir M, MD       ketorolac  (TORADOL ) 15 MG/ML injection 15 mg  15 mg Intravenous Q6H Josiah Nigh, MD   15 mg at 05/24/23 1137   metoprolol  tartrate (LOPRESSOR ) tablet 25 mg  25 mg Oral BID Unk Garb, DO   25 mg at 05/24/23 9604   montelukast  (SINGULAIR ) tablet 10 mg  10 mg Oral QHS Jannette Mend, Mir M, MD   10 mg at 05/23/23 2202   ondansetron  (ZOFRAN ) tablet 4 mg  4 mg Oral Q6H PRN Gaylin Ke, MD       Or   ondansetron  (ZOFRAN ) injection 4 mg  4 mg Intravenous Q6H PRN Jannette Mend, Mir M, MD       oxyCODONE  (Oxy IR/ROXICODONE ) immediate release tablet 10 mg  10 mg Oral Q4H PRN Smith, Daniel C, MD   10 mg at 05/24/23 0240   piperacillin-tazobactam (ZOSYN) IVPB 3.375 g  3.375 g Intravenous Q8H Legge, Justin M, RPH 12.5 mL/hr at 05/24/23 0914 3.375 g at 05/24/23 0914   potassium chloride SA (KLOR-CON M) CR tablet 40 mEq  40 mEq Oral Q4H Unk Garb, DO   40 mEq at 05/24/23 1137   sodium chloride  (OCEAN) 0.65 % nasal spray 1 spray  1 spray Each Nare PRN Chavez, Abigail, NP   1 spray at 05/23/23 0122   sodium chloride  flush (NS) 0.9 % injection 10 mL  10 mL Intrapleural Q8H Josiah Nigh, MD   10 mL at 05/24/23 0911   traZODone (DESYREL) tablet 25 mg  25 mg Oral QHS PRN Gaylin Ke, MD   25 mg at 05/24/23 0106     Discharge Medications: Please see discharge summary for a list of discharge  medications.  Relevant Imaging Results:  Relevant Lab Results:   Additional Information SSN:[id number]  Emery Dupuy M Lamel Mccarley, LCSW

## 2023-05-24 NOTE — Progress Notes (Signed)
 PROGRESS NOTE    Cody Tate  JYN:[id number] DOB: [birth year] DOA: 05/22/2023 PCP: Benedetta Bradley, MD  Subjective: Pt seen and examined. Met with pt and his wife at bedside. Pt sitting in recliner. On RA.  Breathing better.  Has another [phone] ml in new chest atrium.  Pt c/o constipation.   Hospital Course: HPI: Cody Tate is a 74 y.o. male with medical history significant for COPD on room air, hyperlipidemia, CAD, hypertension recent outpatient treatment for COPD exacerbation and suspected pneumonia being admitted to the hospital with acute hypoxic respiratory failure and sepsis.  He was recently treated by pulmonology as an outpatient with courses of p.o. Levaquin  and doxycycline , he seemed to improve overall and was stable on room air.  Starting about 3 days ago, he was intermittently agitated but much according to his wife, seem to be slightly confused yesterday.  Also yesterday, he started having a frothy cough and feeling short of breath.  He had a temperature of 101 yesterday.  This morning, he also seemed a little bit lethargic/confused, not really answering the questions appropriately but this was somewhat transient.  Patient and wife deny any history of nausea, vomiting, chest pain, diarrhea or other concerns.   Significant Events: Admitted 05/22/2023 for acute hypoxic respiratory failure   Admission Labs: Lactic acid 3.0 Na 135, K 3.2, CO2 of 23, BUN 16, Scr 1.15, glu 108 WBC 30.4, HgB 15.9, plt 451, >20% bands BNP 165 Procal 8.7  Admission Imaging Studies: CXR Moderate left pleural effusion with opacification of the majority of the left mid to lower lung field  Significant Labs: Pleural fluid analysis. Fluid LDH 1017, protein 3.9, gluc <20, >10K nucleated cells. 63% neutrophils Strep Pneumo antigen Negative  Significant Imaging Studies:   Antibiotic Therapy: Anti-infectives (From admission, onward)    Start     Dose/Rate Route Frequency Ordered  Stop   05/23/23 1400  Vancomycin  (VANCOCIN ) 1,250 mg in sodium chloride  0.9 % 250 mL IVPB        1,250 mg 166.7 mL/hr over 90 Minutes Intravenous Every 24 hours 05/22/23 1252     05/22/23 1800  ceFEPIme (MAXIPIME) 2 g in sodium chloride  0.9 % 100 mL IVPB  Status:  Discontinued        2 g 200 mL/hr over 30 Minutes Intravenous Every 8 hours 05/22/23 1252 05/22/23 1528   05/22/23 1700  piperacillin -tazobactam (ZOSYN ) IVPB 3.375 g        3.375 g 12.5 mL/hr over 240 Minutes Intravenous Every 8 hours 05/22/23 1544     05/22/23 1300  vancomycin  (VANCOCIN ) IVPB 1000 mg/200 mL premix        1,000 mg 200 mL/hr over 60 Minutes Intravenous  Once 05/22/23 1252 05/22/23 1538   05/22/23 1100  vancomycin  (VANCOCIN ) IVPB 1000 mg/200 mL premix        1,000 mg 200 mL/hr over 60 Minutes Intravenous Every 2 hours while awake 05/22/23 1028 05/22/23 1559   05/22/23 1030  piperacillin -tazobactam (ZOSYN ) IVPB 3.375 g        3.375 g 100 mL/hr over 30 Minutes Intravenous  Once 05/22/23 1028 05/22/23 1116       Procedures: 05-22-2023 chest tube insertion 05-22-2023 pleural fibrinolysis #1 05-23-2023 pleural fibrinolysis #2  Consultants: PCCM    Assessment and Plan: * Sepsis with acute organ dysfunction without septic shock (HCC) On admission. meeting criteria with leukocytosis, tachycardia, fever 101 at home.  Source is suspected community-acquired pneumonia/empyema.  Chest x-ray as above with moderate left  pleural effusion. -Inpatient admission to progressive -Follow-up blood cultures -Empiric broad-spectrum IV antibiotics  05-23-2023 not hypotensive. WBC still 36K. Awaiting blood, sputum, pleural fluid cultures. On Zosyn/Vanco.  Parapneumonic effusion 05-23-2023 left pigtail chest tube inserted yesterday. On Day #2 of pleural lytics. Already has had out at least 2 liters from chest tube.  05-24-2023 awaiting PCCM management of chest tube/pleural lytics.  CAP (community acquired  pneumonia) 05-23-2023 not hypotensive. WBC still 36K. Awaiting blood, sputum, pleural fluid cultures. On Zosyn/Vanco.   05-24-2023 WBC 27K. Remains on Zosyn/vanco. Blood, sputum, pleural fluid cultures negative thus far.  Acute hyponatremia 05-23-2023 Na 127 today. Was 135 yesterday. He received IVF with LR yesterday. He did not get any diuretics.  Will repeat BMP tomorrow. At this point, I think this is just a spurious lab value.  05-24-2023 Na 130 today. Was 127 yesterday. Continue to monitor.  Acute respiratory failure with hypoxia (HCC) 05-23-2023 due to pneumonia and effusion. He is not on home O2. Continue with supplemental O2.  05-24-2023 resolved. Pt has been weaned to RA.  Swelling of lower limb 05-23-2023 chronic per pt and wife. Although wife states right leg is usually not this swollen.  05-24-2023 chronic.  Opioid dependence (HCC) 05-23-2023 has valid Rx for oxycodone  10 mg q4h prn at home.  05-24-2023 continue oxycodone  prn.  Obesity, Class II, BMI 35-39.9 Estimated body mass index is 36.38 kg/m as calculated from the following:   Height as of 05/16/23: 5' 6.5" (1.689 m).   Weight as of 05/16/23: 103.8 kg.    Essential hypertension 05-23-2023 holding HTN meds due to sepsis.  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  BP controlled with much lower doses of lopressor (25 mg bid) than his usual home dose of Toprol -XL 100 mg daily. Continue to hold ARB and diuretic for now.  Hypokalemia 05-23-2023 repleted. K 4.1 today. Resolved.  05-24-2023 K 3.3 today. Give more PO Kcl.  DVT prophylaxis: enoxaparin (LOVENOX) injection 40 mg Start: 05/22/23 2200    Code Status: Full Code Family Communication: discussed with pt's wife at bedside Disposition Plan: home Reason for continuing need for hospitalization: remains with pigtail chest tube. On IV Abx.  Objective: Vitals:   05/23/23 2109 05/24/23  0031 05/24/23 0417 05/24/23 0846  BP:  (!) 125/57 (!) 100/46   Pulse:  74 71   Resp:  18 18   Temp:  97.6 F (36.4 C) 97.6 F (36.4 C)   TempSrc:  Oral Oral   SpO2: 94% 97% 93% 94%    Intake/Output Summary (Last 24 hours) at 05/24/2023 1049 Last data filed at 05/24/2023 0700 Gross per 24 hour  Intake 10 ml  Output 2375 ml  Net -2365 ml   There were no vitals filed for this visit.  Examination:  Physical Exam Vitals and nursing note reviewed.  Constitutional:      General: He is not in acute distress.    Appearance: He is obese. He is not toxic-appearing.     Comments: Looks better than yesterday  HENT:     Head: Normocephalic and atraumatic.     Nose: Nose normal.  Eyes:     General: No scleral icterus. Cardiovascular:     Rate and Rhythm: Normal rate and regular rhythm.  Pulmonary:     Effort: Pulmonary effort is normal.     Breath sounds: Normal breath sounds.  Abdominal:     General: Abdomen is protuberant. Bowel sounds are normal. There  is no distension.     Palpations: Abdomen is soft.  Musculoskeletal:     Right lower leg: Edema present.     Left lower leg: Edema present.     Comments: +2 pitting pretibial edema bilaterally  Skin:    General: Skin is warm and dry.     Capillary Refill: Capillary refill takes less than 2 seconds.  Neurological:     Mental Status: He is alert and oriented to person, place, and time.     Data Reviewed: I have personally reviewed following labs and imaging studies  CBC: Recent Labs  Lab 05/22/23 1005 05/23/23 0419 05/24/23 0351  WBC 30.4* 36.2* 27.6*  NEUTROABS 26.2*  --  23.4*  HGB 15.9 12.6* 13.3  HCT 48.0 40.6 42.0  MCV 90.2 94.6 92.1  PLT 451* 365 355   Basic Metabolic Panel: Recent Labs  Lab 05/22/23 1005 05/23/23 0419 05/24/23 0351  NA 135 127* 130*  K 3.2* 4.1 3.3*  CL 99 93* 95*  CO2 23 25 25   GLUCOSE 108* 109* 102*  BUN 16 22 27*  CREATININE 1.15 1.05 0.70  CALCIUM  8.5* 8.0* 8.1*    GFR: Estimated Creatinine Clearance: 93.6 mL/min (by C-G formula based on SCr of 0.7 mg/dL). Liver Function Tests: Recent Labs  Lab 05/17/23 1146 05/22/23 1005 05/24/23 0351  AST 17 37 44*  ALT 14 36 33  ALKPHOS 208* 178* 137*  BILITOT 1.2 1.6* 0.6  PROT 6.4 6.1* 4.9*  ALBUMIN 3.6* 2.4* 1.9*   Coagulation Profile: Recent Labs  Lab 05/22/23 1005  INR 1.2   BNP (last 3 results) Recent Labs    05/22/23 1005  BNP 165.8*   HbA1C: Recent Labs    05/23/23 0419  HGBA1C 5.3   CBG: Recent Labs  Lab 05/23/23 0736 05/23/23 1210 05/23/23 1658 05/23/23 2011 05/24/23 0729  GLUCAP 114* 140* 114* 168* 120*   Sepsis Labs: Recent Labs  Lab 05/22/23 1018 05/22/23 1420 05/22/23 1533 05/22/23 1813  PROCALCITON  --  8.71  --   --   LATICACIDVEN 3.0*  --  3.4* 3.6*    Recent Results (from the past 240 hours)  Culture, blood (Routine x 2)     Status: None (Preliminary result)   Collection Time: 05/22/23 10:05 AM   Specimen: BLOOD LEFT ARM  Result Value Ref Range Status   Specimen Description   Final    BLOOD LEFT ARM Performed at [facility], [address], [place], [place] [address]    Special Requests   Final    BOTTLES DRAWN AEROBIC AND ANAEROBIC Blood Culture results may not be optimal due to an inadequate volume of blood received in culture bottles Performed at [facility], [address], [place], [place] [address]    Culture   Final    NO GROWTH 2 DAYS Performed at [facility], [address], [place], [place] [address]    Report Status PENDING  Incomplete  Culture, blood (Routine x 2)     Status: None (Preliminary result)   Collection Time: 05/22/23 10:09 AM   Specimen: BLOOD  Result Value Ref Range Status   Specimen Description   Final    BLOOD RIGHT ANTECUBITAL Performed at [facility], [address] [place], [place] [address]    Special Requests   Final    BOTTLES DRAWN AEROBIC AND  ANAEROBIC Blood Culture results may not be optimal due to an inadequate volume of blood received  in culture bottles Performed at [facility], [address] [place], [place] [address]    Culture   Final    NO GROWTH 2 DAYS Performed at [facility], [address], [place], [place] [address]    Report Status PENDING  Incomplete  Body fluid culture w Gram Stain     Status: None (Preliminary result)   Collection Time: 05/22/23  2:49 PM   Specimen: Pleural Fluid  Result Value Ref Range Status   Specimen Description   Final    PLEURAL Performed at [facility], [address] [place], [place] [address]    Special Requests   Final    NONE Performed at [facility], [address], [place], [place] [address]    Gram Stain   Final    WBC PRESENT, PREDOMINANTLY PMN NO ORGANISMS SEEN CYTOSPIN SMEAR    Culture   Final    CULTURE REINCUBATED FOR BETTER GROWTH Performed at [facility], [address], [place], [place] [address]    Report Status PENDING  Incomplete  MRSA Next Gen by PCR, Nasal     Status: None   Collection Time: 05/22/23  5:35 PM   Specimen: Nasal Mucosa; Nasal Swab  Result Value Ref Range Status   MRSA by PCR Next Gen NOT DETECTED NOT DETECTED Final    Comment: (NOTE) The GeneXpert MRSA Assay (FDA approved for NASAL specimens only), is one component of a comprehensive MRSA colonization surveillance program. It is not intended to diagnose MRSA infection nor to guide or monitor treatment for MRSA infections. Test performance is not FDA approved in patients less than 58 years old. Performed at [facility], [address], [place], [place] [address]   Culture, Respiratory w Gram Stain     Status: None (Preliminary result)   Collection Time: 05/22/23  5:38 PM   Specimen: Tracheal Aspirate  Result Value Ref Range Status   Specimen Description   Final    TRACHEAL ASPIRATE Performed at [facility], [address], [place], [place] [address]    Special Requests   Final    NONE Performed at [facility], [address] [place], [place] [address]    Gram Stain   Final    ABUNDANT WBC PRESENT, PREDOMINANTLY PMN RARE GRAM POSITIVE COCCI RARE GRAM POSITIVE RODS    Culture   Final    CULTURE REINCUBATED FOR BETTER GROWTH Performed at [facility], [address], [place], [place] [address]    Report Status PENDING  Incomplete     Radiology Studies: DG Chest 1 View Result Date: 05/23/2023 CLINICAL DATA:  Chest tube in place. EXAM: CHEST  1 VIEW COMPARISON:  Chest radiograph dated 05/23/2023. FINDINGS: Similar positioning of the left chest tube. Stable left lung base opacity. Small left pneumothorax similar to prior radiograph. Stable cardiac silhouette. No acute osseous pathology. IMPRESSION: No interval change. Electronically Signed   By: Angus Bark M.D.   On: 05/23/2023 12:13   DG Chest Port 1 View Result Date: 05/23/2023 CLINICAL DATA:  Chest tube in place. EXAM: PORTABLE CHEST 1 VIEW COMPARISON:  May 22, 2023. FINDINGS: The heart size and mediastinal contours are within normal limits. Right lung is clear. Stable left-sided pleural drainage catheter. Decreased loculated left pleural effusion is noted with small pneumothorax component. Left basilar atelectasis is noted. The visualized skeletal structures are unremarkable. IMPRESSION: Stable left-sided pleural drainage catheter.  Loculated left hydropneumothorax is noted which is slightly decreased compared to prior exam. Electronically Signed   By: Rosalene Colon M.D.   On: 05/23/2023 07:53   DG CHEST PORT 1 VIEW Result Date: 05/22/2023 CLINICAL DATA:  Chest tube in place EXAM: PORTABLE CHEST 1 VIEW COMPARISON:  Chest x-ray 05/22/2023 FINDINGS: There is a moderate size left pleural effusion, unchanged. Questionable tiny left pneumothorax component seen near the apex. Focal opacity in the left  mid and lower lung appears unchanged. There is a new left-sided chest tube in the lower left hemithorax. The right lung is clear. The cardiomediastinal silhouette is stable. Osseous structures are stable. IMPRESSION: 1. New left-sided chest tube in place. 2. Moderate size left pleural effusion, unchanged. Questionable tiny left pneumothorax component seen near the apex which may be related to chest tube placement. 3. Focal opacity in the left mid and lower lung appears unchanged. Electronically Signed   By: Tyron Gallon M.D.   On: 05/22/2023 16:54    Scheduled Meds:  acetaminophen   650 mg Oral Q6H WA   budesonide -glycopyrrolate -formoterol   2 puff Inhalation BID   enoxaparin (LOVENOX) injection  40 mg Subcutaneous Q24H   fluticasone   1 spray Each Nare Daily   insulin aspart  0-15 Units Subcutaneous TID WC   insulin aspart  0-5 Units Subcutaneous QHS   ketorolac   15 mg Intravenous Q6H   metoprolol  tartrate  25 mg Oral BID   montelukast   10 mg Oral QHS   potassium chloride  40 mEq Oral Q4H   sodium chloride  flush  10 mL Intrapleural Q8H   Continuous Infusions:  piperacillin-tazobactam (ZOSYN)  IV 3.375 g (05/24/23 0914)   vancomycin        LOS: 2 days   Time spent: 50 minutes  Unk Garb, DO  [facility]  05/24/2023, 10:49 AM

## 2023-05-24 NOTE — Progress Notes (Signed)
 PHARMACY NOTE:  ANTIMICROBIAL RENAL DOSAGE ADJUSTMENT  Current antimicrobial regimen includes a mismatch between antimicrobial dosage and estimated renal function. As per policy approved by the Pharmacy & Therapeutics and Medical Executive Committees, the antimicrobial dosage will be adjusted accordingly.  Current antimicrobial dosage: vancomycin  1250mg  IV q24h  Indication: CAP, early empyema   Renal Function:  Estimated Creatinine Clearance: 93.6 mL/min (by C-G formula based on SCr of 0.7 mg/dL). []      On intermittent HD, scheduled: []      On CRRT    Antimicrobial dosage has been changed to: vancomycin  1750mg  IV q24h (eAUC 510 using Scr rounded to 0.8, Vd 0.5)  Additional comments: N/A   Thank you for allowing pharmacy to be a part of this patient's care.   Roselee Cong, PharmD Clinical Pharmacist  5/23/20257:33 AM

## 2023-05-24 NOTE — Progress Notes (Signed)
 Physical Therapy Treatment Patient Details Name: Cody Tate MRN: [id number] DOB: August 13, 1950 Today's Date: 05/24/2023   History of Present Illness 73 y.o. male being admitted to the hospital with acute hypoxic respiratory failure and sepsis. Past medical history significant for COPD on room air, hyperlipidemia, CAD, hypertension recent outpatient treatment for COPD exacerbation and suspected pneumonia    PT Comments  Pt tolerated increased activity level today, he ambulated 43' with RW, with verbal cues for safe positioning in RW and for posture, no loss of balance, 1/4 dyspnea, SpO2 99% on room air 1 minute after walking. Pt performed seated BLE strengthening exercises.     If plan is discharge home, recommend the following: A little help with walking and/or transfers;A little help with bathing/dressing/bathroom;Assistance with cooking/housework;Assist for transportation;Help with stairs or ramp for entrance   Can travel by private vehicle     Yes  Equipment Recommendations  None recommended by PT    Recommendations for Other Services       Precautions / Restrictions Precautions Precautions: Fall Recall of Precautions/Restrictions: Intact Precaution/Restrictions Comments: pt reports multiple "close calls", but denies falls in past 6 months; OPPT notes from March report 5-6 falls in past 6 months Restrictions Weight Bearing Restrictions Per Provider Order: No     Mobility  Bed Mobility               General bed mobility comments: up in recliner start and end of session    Transfers Overall transfer level: Needs assistance Equipment used: Rolling walker (2 wheels) Transfers: Sit to/from Stand Sit to Stand: Contact guard assist           General transfer comment: VCs hand placement    Ambulation/Gait Ambulation/Gait assistance: Contact guard assist Gait Distance (Feet): 70 Feet Assistive device: Rolling walker (2 wheels) Gait Pattern/deviations:  Step-through pattern, Decreased stride length, Trunk flexed Gait velocity: decr     General Gait Details: VCs positioning in RW and posture, no loss of balance, 1/4 dyspnea, SpO2 99% room air 1 minute after walking   Stairs             Wheelchair Mobility     Tilt Bed    Modified Rankin (Stroke Patients Only)       Balance Overall balance assessment: Needs assistance, History of Falls Sitting-balance support: Feet supported, No upper extremity supported Sitting balance-Leahy Scale: Fair     Standing balance support: During functional activity, Bilateral upper extremity supported, Reliant on assistive device for balance Standing balance-Leahy Scale: Poor                              Communication Communication Communication: Impaired Factors Affecting Communication: Hearing impaired  Cognition Arousal: Alert Behavior During Therapy: WFL for tasks assessed/performed   PT - Cognitive impairments: No apparent impairments                         Following commands: Intact      Cueing Cueing Techniques: Verbal cues  Exercises General Exercises - Lower Extremity Ankle Circles/Pumps: AROM, Both, 10 reps, Seated Long Arc Quad: AROM, Both, 10 reps, Seated Hip Flexion/Marching: AROM, Both, 10 reps, Seated    General Comments        Pertinent Vitals/Pain Pain Assessment Pain Score: 2  Pain Location: L side and back Pain Descriptors / Indicators: Sore Pain Intervention(s): Limited activity within patient's tolerance, Monitored during session, Repositioned  Home Living                          Prior Function            PT Goals (current goals can now be found in the care plan section) Acute Rehab PT Goals Patient Stated Goal: to be able to walk PT Goal Formulation: With patient Time For Goal Achievement: 05/30/23 Potential to Achieve Goals: Fair Progress towards PT goals: Progressing toward goals    Frequency     Min 3X/week      PT Plan      Co-evaluation              AM-PAC PT "6 Clicks" Mobility   Outcome Measure  Help needed turning from your back to your side while in a flat bed without using bedrails?: A Little Help needed moving from lying on your back to sitting on the side of a flat bed without using bedrails?: A Lot Help needed moving to and from a bed to a chair (including a wheelchair)?: A Little Help needed standing up from a chair using your arms (e.g., wheelchair or bedside chair)?: A Little Help needed to walk in hospital room?: A Little Help needed climbing 3-5 steps with a railing? : A Lot 6 Click Score: 16    End of Session Equipment Utilized During Treatment: Gait belt Activity Tolerance: Patient tolerated treatment well Patient left: in chair;with chair alarm set;with call bell/phone within reach;with family/visitor present Nurse Communication: Mobility status PT Visit Diagnosis: Difficulty in walking, not elsewhere classified (R26.2);Pain;History of falling (Z91.81);Unsteadiness on feet (R26.81)     Time: 1515-1530 PT Time Calculation (min) (ACUTE ONLY): 15 min  Charges:    $Gait Training: 8-22 mins PT General Charges $$ ACUTE PT VISIT: 1 Visit                     [name] PT 05/24/2023  Acute Rehabilitation Services  Office [phone]

## 2023-05-24 NOTE — Progress Notes (Signed)
 NAME:  Cody Tate, MRN:  [id number], DOB:  August 05, 1950, LOS: 2 ADMISSION DATE:  05/22/2023, CONSULTATION DATE:  05/22/23 REFERRING MD:  Jannette Mend, CHIEF COMPLAINT:  SOB   History of Present Illness:  73 year old man w/ hx of smoking, COPD, prior abnormal CT in 2024 with R sided necrotic masses that resolved with abx presenting with about 3 weeks of worsening SOB, cough with intermittent hemoptysis.  Has taken course of doxy then levaquin .  Imaging initially with rounded mass-like left consolidation now with loculated effusion associated.  PCCM consulted to assist with management.  Normally RA, currently high 80s sats on 5LPM.  Pertinent  Medical History   Past Medical History:  Diagnosis Date   Acute heart failure with preserved ejection fraction (HCC) 11/12/2022   Arthritis    Back pain    Radiates down both legs   BPH (benign prostatic hyperplasia)    Cancer (HCC) 2022   prostate cancer   COPD (chronic obstructive pulmonary disease) (HCC)    Dyspnea    Enlarged prostate    GERD (gastroesophageal reflux disease)    Glaucoma    H/O hiatal hernia    H/O wheezing    occ inhaler usage   Heart murmur    Hypertension    Lumbar foraminal stenosis    Macular degeneration    Meningioma (HCC) 2014   Treated with radiation   Neuromuscular disorder (HCC)    Neuropathy bilateral legs   PONV (postoperative nausea and vomiting)    Pulmonary nodules    resolved on 2019 follow up CT   S/P insertion of spinal cord stimulator      Significant Hospital Events: Including procedures, antibiotic start and stop dates in addition to other pertinent events   5/21 admit, pigtail first pleural lytics administered 5/22 second dose pleural lytics administered 5/23 825 out of chest tube overnight  Interim History / Subjective:  States he feels well this a.m. with no acute complaints, denies any chest pain  Objective    Blood pressure (!) 100/46, pulse 71, temperature 97.6 F (36.4 C),  temperature source Oral, resp. rate 18, SpO2 94%.        Intake/Output Summary (Last 24 hours) at 05/24/2023 0852 Last data filed at 05/24/2023 0700 Gross per 24 hour  Intake 210 ml  Output 2375 ml  Net -2165 ml   There were no vitals filed for this visit.  Examination: General: Well-appearing elderly male sitting up in bedside recliner no acute distress HEENT: [place]/AT, MM pink/moist, PERRL,  Neuro: Alert and oriented x 3, nonfocal CV: s1s2 regular rate and rhythm, no murmur, rubs, or gallops,  PULM: Clear to auscultation bilaterally, no increased work of breathing, no added breath sounds, on room air GI: soft, bowel sounds active in all 4 quadrants, non-tender, non-distended, tolerating oral diet Extremities: warm/dry, no edema  Skin: no rashes or lesions  Resolved problem list  Sepsis   Assessment and Plan  Left CAP with empyema formation -Low suspicion malignancy  -With hx of prior cavitary PNA probably need eval for either aspiration or immunodeficiency as OP. P: Routine chest tube care Follow a.m. chest x-ray to determine need for third dose of pleural lytics Scheduled pleural flushes per chest tube Daily chest x-ray Follow cultures Will need repeat CT once oral space has been emptied Continue bronchodilators Continue Zosyn  PCCM will continue to follow  Lang Zingg D. Raquel Cables, NP-C [facility] Personal contact information can be found on Amion  If  no contact or response made please call [phone] 05/24/2023, 10:52 AM

## 2023-05-25 DIAGNOSIS — J869 Pyothorax without fistula: Secondary | ICD-10-CM | POA: Diagnosis not present

## 2023-05-25 DIAGNOSIS — E871 Hypo-osmolality and hyponatremia: Secondary | ICD-10-CM | POA: Diagnosis not present

## 2023-05-25 DIAGNOSIS — E66812 Obesity, class 2: Secondary | ICD-10-CM | POA: Diagnosis not present

## 2023-05-25 DIAGNOSIS — J189 Pneumonia, unspecified organism: Secondary | ICD-10-CM | POA: Diagnosis not present

## 2023-05-25 DIAGNOSIS — I10 Essential (primary) hypertension: Secondary | ICD-10-CM | POA: Diagnosis not present

## 2023-05-25 LAB — GLUCOSE, CAPILLARY
Glucose-Capillary: 108 mg/dL — ABNORMAL HIGH (ref 70–99)
Glucose-Capillary: 151 mg/dL — ABNORMAL HIGH (ref 70–99)
Glucose-Capillary: 127 mg/dL — ABNORMAL HIGH (ref 70–99)
Glucose-Capillary: 132 mg/dL — ABNORMAL HIGH (ref 70–99)

## 2023-05-25 LAB — CBC WITH DIFFERENTIAL/PLATELET
Abs Immature Granulocytes: 0.35 10*3/uL — ABNORMAL HIGH (ref 0.00–0.07)
Basophils Absolute: 0.1 10*3/uL (ref 0.0–0.1)
Basophils Relative: 1 %
Eosinophils Absolute: 0.1 10*3/uL (ref 0.0–0.5)
Eosinophils Relative: 1 %
HCT: 42.5 % (ref 39.0–52.0)
Hemoglobin: 13.8 g/dL (ref 13.0–17.0)
Immature Granulocytes: 2 %
Lymphocytes Relative: 6 %
Lymphs Abs: 1 10*3/uL (ref 0.7–4.0)
MCH: 29.2 pg (ref 26.0–34.0)
MCHC: 32.5 g/dL (ref 30.0–36.0)
MCV: 89.9 fL (ref 80.0–100.0)
Monocytes Absolute: 0.8 10*3/uL (ref 0.1–1.0)
Monocytes Relative: 4 %
Neutro Abs: 15.8 10*3/uL — ABNORMAL HIGH (ref 1.7–7.7)
Neutrophils Relative %: 86 %
Platelets: 360 10*3/uL (ref 150–400)
RBC: 4.73 MIL/uL (ref 4.22–5.81)
RDW: 13.1 % (ref 11.5–15.5)
WBC: 18.2 10*3/uL — ABNORMAL HIGH (ref 4.0–10.5)
nRBC: 0 % (ref 0.0–0.2)

## 2023-05-25 LAB — BASIC METABOLIC PANEL WITH GFR
Anion gap: 10 (ref 5–15)
BUN: 23 mg/dL (ref 8–23)
CO2: 25 mmol/L (ref 22–32)
Calcium: 8.3 mg/dL — ABNORMAL LOW (ref 8.9–10.3)
Chloride: 97 mmol/L — ABNORMAL LOW (ref 98–111)
Creatinine, Ser: 0.63 mg/dL (ref 0.61–1.24)
GFR, Estimated: >60 mL/min (ref >60)
Glucose, Bld: 97 mg/dL (ref 70–99)
Potassium: 4 mmol/L (ref 3.5–5.1)
Sodium: 132 mmol/L — ABNORMAL LOW (ref 135–145)

## 2023-05-25 LAB — MAGNESIUM: Magnesium: 2.3 mg/dL (ref 1.7–2.4)

## 2023-05-25 MED ORDER — FUROSEMIDE 20 MG PO TABS
20.0000 mg | ORAL_TABLET | Freq: Every day | ORAL | Status: DC
  Administered 2023-05-25 – 2023-05-27 (×3): 20 mg via ORAL
  Filled 2023-05-25 (×3): qty 1

## 2023-05-25 NOTE — Progress Notes (Signed)
 NAME:  Cody Tate, MRN:  [id number], DOB:  1950-12-28, LOS: 3 ADMISSION DATE:  05/22/2023, CONSULTATION DATE:  05/22/23 REFERRING MD:  Jannette Mend, CHIEF COMPLAINT:  SOB   History of Present Illness:  73 year old man w/ hx of smoking, COPD, prior abnormal CT in 2024 with R sided necrotic masses that resolved with abx presenting with about 3 weeks of worsening SOB, cough with intermittent hemoptysis.  Has taken course of doxy then levaquin .  Imaging initially with rounded mass-like left consolidation now with loculated effusion associated.  PCCM consulted to assist with management.  Normally RA, currently high 80s sats on 5LPM.  Pertinent  Medical History   Past Medical History:  Diagnosis Date   Acute heart failure with preserved ejection fraction (HCC) 11/12/2022   Arthritis    Back pain    Radiates down both legs   BPH (benign prostatic hyperplasia)    Cancer (HCC) 2022   prostate cancer   COPD (chronic obstructive pulmonary disease) (HCC)    Dyspnea    Enlarged prostate    GERD (gastroesophageal reflux disease)    Glaucoma    H/O hiatal hernia    H/O wheezing    occ inhaler usage   Heart murmur    Hypertension    Lumbar foraminal stenosis    Macular degeneration    Meningioma (HCC) 2014   Treated with radiation   Neuromuscular disorder (HCC)    Neuropathy bilateral legs   PONV (postoperative nausea and vomiting)    Pulmonary nodules    resolved on 2019 follow up CT   S/P insertion of spinal cord stimulator      Significant Hospital Events: Including procedures, antibiotic start and stop dates in addition to other pertinent events   5/21 admit, pigtail first pleural lytics administered 5/22 second dose pleural lytics administered 5/23 825 out of chest tube overnight, lytics #3  Interim History / Subjective:   Denies chest pain or dyspnea On room air 375 cc out of chest tube after lytics overnight   Objective    Blood pressure (!) 148/70, pulse 86,  temperature 98.4 F (36.9 C), resp. rate 16, SpO2 97%.        Intake/Output Summary (Last 24 hours) at 05/25/2023 0939 Last data filed at 05/25/2023 8657 Gross per 24 hour  Intake --  Output 3025 ml  Net -3025 ml   There were no vitals filed for this visit.  Examination: General: Well-appearing elderly male sitting up in bedside recliner no acute distress HEENT: [place]/AT, MM pink/moist, PERRL,  Neuro: Alert, interactive, nonfocal CV: s1s2 regular rate and rhythm, no murmur, rubs, or gallops,  PULM: Decreased breath sounds on left, no accessory muscle use, sanguinous drainage on left pigtail GI: soft, bowel sounds active in all 4 quadrants, non-tender, non-distended, tolerating oral diet Extremities: warm/dry, no edema  Skin: no rashes or lesions  Labs show decreased leukocytosis 18K, mild hyponatremia CT chest confirms masslike consolidation left upper lobe with mild cavitation trace left hydropneumothorax, resolved consolidation right upper lobe from 11/2022 study  Resolved problem list  Sepsis   Assessment and Plan  Left CAP with empyema formation - Pseudomonas on respiratory culture -History of right-sided pneumonia 11/2022 P: Discontinue pigtail today if drainage remains low He will need extended course of antibiotics  for Pseudomonas pneumonia, was treated with Levaquin  as outpatient for 10 days prior to hospital admission, would suggest 3 to 4 weeks, await sensitivities, continue Zosyn meantime.  -Unclear cause of recurrent pneumonia, he denies  obvious aspiration episodes or reflux  Updated patient and wife at bedside     Macedonio Scallon V. Villa Greaser MD [facility] Personal contact information can be found on Amion  If no contact or response made please call [phone] 05/25/2023, 9:39 AM

## 2023-05-25 NOTE — Progress Notes (Signed)
   Pleural fluid cultures growing   MODERATE STREPTOCOCCUS INTERMEDIUS   Unk Garb, DO [facility]

## 2023-05-25 NOTE — Plan of Care (Signed)

## 2023-05-25 NOTE — Progress Notes (Signed)
 PROGRESS NOTE    Cody Tate  GNF:[id number] DOB: 11-29-1950 DOA: 05/22/2023 PCP: Benedetta Bradley, MD  Subjective: Pt seen and examined. Met with pt and his wife at bedside. Pt sitting in recliner. On RA.  Eating breakfast.  Breathing better.  No record of output of chest tube.  CT chest yesterday showed cavitary pneumonia.  Tracheal aspirate growing rare pseudomonas but blood and pleural fluid cultures are negative.   Hospital Course: HPI: Cody Tate is a 73 y.o. male with medical history significant for COPD on room air, hyperlipidemia, CAD, hypertension recent outpatient treatment for COPD exacerbation and suspected pneumonia being admitted to the hospital with acute hypoxic respiratory failure and sepsis.  He was recently treated by pulmonology as an outpatient with courses of p.o. Levaquin  and doxycycline , he seemed to improve overall and was stable on room air.  Starting about 3 days ago, he was intermittently agitated but much according to his wife, seem to be slightly confused yesterday.  Also yesterday, he started having a frothy cough and feeling short of breath.  He had a temperature of 101 yesterday.  This morning, he also seemed a little bit lethargic/confused, not really answering the questions appropriately but this was somewhat transient.  Patient and wife deny any history of nausea, vomiting, chest pain, diarrhea or other concerns.   Significant Events: Admitted 05/22/2023 for acute hypoxic respiratory failure   Admission Labs: Lactic acid 3.0 Na 135, K 3.2, CO2 of 23, BUN 16, Scr 1.15, glu 108 WBC 30.4, HgB 15.9, plt 451, >20% bands BNP 165 Procal 8.7  Admission Imaging Studies: CXR Moderate left pleural effusion with opacification of the majority of the left mid to lower lung field  Significant Labs: Pleural fluid analysis. Fluid LDH 1017, protein 3.9, gluc <20, >10K nucleated cells. 63% neutrophils Strep Pneumo antigen Negative  Significant  Imaging Studies:   Antibiotic Therapy: Anti-infectives (From admission, onward)    Start     Dose/Rate Route Frequency Ordered Stop   05/23/23 1400  Vancomycin  (VANCOCIN ) 1,250 mg in sodium chloride  0.9 % 250 mL IVPB        1,250 mg 166.7 mL/hr over 90 Minutes Intravenous Every 24 hours 05/22/23 1252     05/22/23 1800  ceFEPIme (MAXIPIME) 2 g in sodium chloride  0.9 % 100 mL IVPB  Status:  Discontinued        2 g 200 mL/hr over 30 Minutes Intravenous Every 8 hours 05/22/23 1252 05/22/23 1528   05/22/23 1700  piperacillin-tazobactam (ZOSYN) IVPB 3.375 g        3.375 g 12.5 mL/hr over 240 Minutes Intravenous Every 8 hours 05/22/23 1544     05/22/23 1300  vancomycin  (VANCOCIN ) IVPB 1000 mg/200 mL premix        1,000 mg 200 mL/hr over 60 Minutes Intravenous  Once 05/22/23 1252 05/22/23 1538   05/22/23 1100  vancomycin  (VANCOCIN ) IVPB 1000 mg/200 mL premix        1,000 mg 200 mL/hr over 60 Minutes Intravenous Every 2 hours while awake 05/22/23 1028 05/22/23 1559   05/22/23 1030  piperacillin-tazobactam (ZOSYN) IVPB 3.375 g        3.375 g 100 mL/hr over 30 Minutes Intravenous  Once 05/22/23 1028 05/22/23 1116       Procedures: 05-22-2023 chest tube insertion 05-22-2023 pleural fibrinolysis #1 05-23-2023 pleural fibrinolysis #2  Consultants: PCCM    Assessment and Plan: * Sepsis with acute organ dysfunction without septic shock (HCC) On admission. meeting criteria with leukocytosis,  tachycardia, fever 101 at home.  Source is suspected community-acquired pneumonia/empyema.  Chest x-ray as above with moderate left pleural effusion. -Inpatient admission to progressive -Follow-up blood cultures -Empiric broad-spectrum IV antibiotics  05-23-2023 not hypotensive. WBC still 36K. Awaiting blood, sputum, pleural fluid cultures. On Zosyn/Vanco.  05-25-2023 WBC down to 18. BP normal. No fevers. Sepsis is resolved.  Parapneumonic effusion 05-23-2023 left pigtail chest tube inserted  yesterday. On Day #2 of pleural lytics. Already has had out at least 2 liters from chest tube.  05-24-2023 awaiting PCCM management of chest tube/pleural lytics.  05-25-2023 has had total of 3 doses of intra-pleural lytics. Awaiting PCCM to pulled chest tube. CT chest shows cavitary pneumonia.  CAP (community acquired pneumonia) 05-23-2023 not hypotensive. WBC still 36K. Awaiting blood, sputum, pleural fluid cultures. On Zosyn/Vanco.   05-24-2023 WBC 27K. Remains on Zosyn/vanco. Blood, sputum, pleural fluid cultures negative thus far.  05-25-2023 WBC down to 18. On Zosyn only. Vanco stopped yesterday. Trach aspirate only growing rare pseudomonas. Pleural fluid and blood cx are negative.  Acute hyponatremia 05-23-2023 Na 127 today. Was 135 yesterday. He received IVF with LR yesterday. He did not get any diuretics.  Will repeat BMP tomorrow. At this point, I think this is just a spurious lab value.  05-24-2023 Na 130 today. Was 127 yesterday. Continue to monitor.  05-25-2023 Na up to 132. Resolved.  Acute respiratory failure with hypoxia (HCC)-resolved as of 05/25/2023 05-23-2023 due to pneumonia and effusion. He is not on home O2. Continue with supplemental O2.  05-24-2023 resolved. Pt has been weaned to RA.  Swelling of lower limb 05-23-2023 chronic per pt and wife. Although wife states right leg is usually not this swollen.  05-24-2023 chronic.  05-25-2023 stable. Chronic.  Opioid dependence (HCC) 05-23-2023 has valid Rx for oxycodone  10 mg q4h prn at home.  05-24-2023 continue oxycodone  prn.  05-25-2023 stable  Obesity, Class II, BMI 35-39.9 Estimated body mass index is 36.38 kg/m as calculated from the following:   Height as of 05/16/23: 5' 6.5" (1.689 m).   Weight as of 05/16/23: 103.8 kg.    Essential hypertension 05-23-2023 holding HTN meds due to sepsis.  05-24-2023 restarted lopressor  yesterday to 25 mg bid due to tachycardia(likely betablocker withdrawal) and  HTN.  BP controlled with much lower doses of lopressor (25 mg bid) than his usual home dose of Toprol -XL 100 mg daily. Continue to hold ARB and diuretic for now.  05-25-2023 BP on the rise today.  After chest tube pulled, may need to increase his HTN meds. Increased BP may be due to pain from chest tube.  Hypokalemia 05-23-2023 repleted. K 4.1 today. Resolved.  05-24-2023 K 3.3 today. Give more PO Kcl.  05-25-2023 resolved.   DVT prophylaxis: enoxaparin (LOVENOX) injection 40 mg Start: 05/22/23 2200    Code Status: Full Code Family Communication: discussed with pt's wife June. They have been married over 50 years Disposition Plan: return home Reason for continuing need for hospitalization: still has chest tube. On IV Abx.  Objective: Vitals:   05/24/23 0846 05/24/23 1324 05/24/23 1946 05/25/23 0405  BP:  139/70 (!) 157/64 (!) 148/70  Pulse:  70 72 86  Resp:  20 14 16   Temp:  98 F (36.7 C) 98 F (36.7 C) 98.4 F (36.9 C)  TempSrc:  Oral    SpO2: 94% 99% 97% 97%    Intake/Output Summary (Last 24 hours) at 05/25/2023 1222 Last data filed at 05/25/2023 0655 Gross per 24 hour  Intake --  Output 3025 ml  Net -3025 ml   There were no vitals filed for this visit.  Examination:  Physical Exam Vitals and nursing note reviewed.  Constitutional:      General: He is not in acute distress.    Appearance: He is obese. He is not toxic-appearing.  HENT:     Head: Normocephalic and atraumatic.     Nose: Nose normal.  Cardiovascular:     Rate and Rhythm: Normal rate and regular rhythm.  Pulmonary:     Effort: Pulmonary effort is normal.  Abdominal:     General: Bowel sounds are normal. There is no distension.     Palpations: Abdomen is soft.  Musculoskeletal:     Right lower leg: Edema present.     Left lower leg: Edema present.  Skin:    General: Skin is warm and dry.     Capillary Refill: Capillary refill takes less than 2 seconds.  Neurological:     Mental Status: He  is alert and oriented to person, place, and time.     Data Reviewed: I have personally reviewed following labs and imaging studies  CBC: Recent Labs  Lab 05/22/23 1005 05/23/23 0419 05/24/23 0351 05/25/23 0428  WBC 30.4* 36.2* 27.6* 18.2*  NEUTROABS 26.2*  --  23.4* 15.8*  HGB 15.9 12.6* 13.3 13.8  HCT 48.0 40.6 42.0 42.5  MCV 90.2 94.6 92.1 89.9  PLT 451* 365 355 360   Basic Metabolic Panel: Recent Labs  Lab 05/22/23 1005 05/23/23 0419 05/24/23 0351 05/25/23 0428  NA 135 127* 130* 132*  K 3.2* 4.1 3.3* 4.0  CL 99 93* 95* 97*  CO2 23 25 25 25   GLUCOSE 108* 109* 102* 97  BUN 16 22 27* 23  CREATININE 1.15 1.05 0.70 0.63  CALCIUM  8.5* 8.0* 8.1* 8.3*  MG  --   --   --  2.3   GFR: Estimated Creatinine Clearance: 93.6 mL/min (by C-G formula based on SCr of 0.63 mg/dL). Liver Function Tests: Recent Labs  Lab 05/22/23 1005 05/24/23 0351  AST 37 44*  ALT 36 33  ALKPHOS 178* 137*  BILITOT 1.6* 0.6  PROT 6.1* 4.9*  ALBUMIN 2.4* 1.9*   Coagulation Profile: Recent Labs  Lab 05/22/23 1005  INR 1.2   BNP (last 3 results) Recent Labs    05/22/23 1005  BNP 165.8*   HbA1C: Recent Labs    05/23/23 0419  HGBA1C 5.3   CBG: Recent Labs  Lab 05/24/23 1140 05/24/23 1646 05/24/23 2142 05/25/23 0721 05/25/23 1215  GLUCAP 142* 113* 94 108* 151*   Sepsis Labs: Recent Labs  Lab 05/22/23 1018 05/22/23 1420 05/22/23 1533 05/22/23 1813  PROCALCITON  --  8.71  --   --   LATICACIDVEN 3.0*  --  3.4* 3.6*    Recent Results (from the past 240 hours)  Culture, blood (Routine x 2)     Status: None (Preliminary result)   Collection Time: 05/22/23 10:05 AM   Specimen: BLOOD LEFT ARM  Result Value Ref Range Status   Specimen Description   Final    BLOOD LEFT ARM Performed at [facility], [address], [place], [place] [address]    Special Requests   Final    BOTTLES DRAWN AEROBIC AND ANAEROBIC Blood Culture results may not be optimal due to an  inadequate volume of blood received in culture bottles Performed at [facility], [address], [place], [place] [address]    Culture  Final    NO GROWTH 3 DAYS Performed at [facility], [address] [place], [place] [address]    Report Status PENDING  Incomplete  Culture, blood (Routine x 2)     Status: None (Preliminary result)   Collection Time: 05/22/23 10:09 AM   Specimen: BLOOD  Result Value Ref Range Status   Specimen Description   Final    BLOOD RIGHT ANTECUBITAL Performed at [facility], [address] [place], [place] [address]    Special Requests   Final    BOTTLES DRAWN AEROBIC AND ANAEROBIC Blood Culture results may not be optimal due to an inadequate volume of blood received in culture bottles Performed at [facility], [address] [place], [place] [address]    Culture   Final    NO GROWTH 3 DAYS Performed at [facility], [address], [place], [place] [address]    Report Status PENDING  Incomplete  Body fluid culture w Gram Stain     Status: None (Preliminary result)   Collection Time: 05/22/23  2:49 PM   Specimen: Pleural Fluid  Result Value Ref Range Status   Specimen Description   Final    PLEURAL Performed at [facility], [address] [place], [place] [address]    Special Requests   Final    NONE Performed at [facility], [address], [place], [place] [address]    Gram Stain   Final    WBC PRESENT, PREDOMINANTLY PMN NO ORGANISMS SEEN CYTOSPIN SMEAR    Culture   Final    CULTURE REINCUBATED FOR BETTER GROWTH Performed at [facility], [address], [place], [place] [address]    Report Status PENDING  Incomplete  MRSA Next Gen by PCR, Nasal     Status: None   Collection Time: 05/22/23  5:35 PM   Specimen: Nasal Mucosa; Nasal Swab  Result Value Ref Range Status   MRSA by PCR Next Gen NOT DETECTED NOT DETECTED Final     Comment: (NOTE) The GeneXpert MRSA Assay (FDA approved for NASAL specimens only), is one component of a comprehensive MRSA colonization surveillance program. It is not intended to diagnose MRSA infection nor to guide or monitor treatment for MRSA infections. Test performance is not FDA approved in patients less than 24 years old. Performed at [facility], [address] [place], [place] [address]   Culture, Respiratory w Gram Stain     Status: None (Preliminary result)   Collection Time: 05/22/23  5:38 PM   Specimen: Tracheal Aspirate  Result Value Ref Range Status   Specimen Description   Final    TRACHEAL ASPIRATE Performed at [facility], [address] [place], [place] [address]    Special Requests   Final    NONE Performed at [facility], [address] [place], [place] [address]    Gram Stain   Final    ABUNDANT WBC PRESENT, PREDOMINANTLY PMN RARE GRAM POSITIVE COCCI RARE GRAM POSITIVE RODS    Culture   Final    RARE PSEUDOMONAS AERUGINOSA CONFIRMATION OF SUSCEPTIBILITIES IN PROGRESS Performed at [facility], [address] [place], [place] [address]    Report Status PENDING  Incomplete     Radiology Studies: CT CHEST WO CONTRAST Result Date: 05/24/2023 CLINICAL DATA:  Pneumonia, complication suspected, xray done, left hydropneumothorax, abnormal x-ray EXAM: CT CHEST WITHOUT CONTRAST TECHNIQUE:  Multidetector CT imaging of the chest was performed following the standard protocol without IV contrast. RADIATION DOSE REDUCTION: This exam was performed according to the departmental dose-optimization program which includes automated exposure control, adjustment of the mA and/or kV according to patient size and/or use of iterative reconstruction technique. COMPARISON:  05/24/2023, 11/28/2022 FINDINGS: Cardiovascular: Unenhanced imaging of the heart is unremarkable without pericardial effusion. Normal caliber of the thoracic  aorta. Atherosclerosis of the aorta and coronary vasculature. Assessment of the vascular lumen cannot be performed without intravenous contrast. Mediastinum/Nodes: No enlarged mediastinal or axillary lymph nodes. Thyroid  gland, trachea, and esophagus demonstrate no significant findings. Lungs/Pleura: Left-sided pigtail pleural drainage catheter is seen coiled along the medial costophrenic angle. Mild diffuse left pleural thickening, with trace left hydropneumothorax identified. Gas component far less than 5%. Fluid component far less than 100 cc. Large area of masslike left upper lobe consolidation is noted, measuring 7.8 by 6.8 x 7.8 cm, corresponding to the chest x-ray finding. Punctate foci of gas within the consolidated lung consistent with central cavitation. There is mass effect upon the major fissure. Overall, findings favor cavitating pneumonia over malignancy given interval development since the 11/28/2022 CT. Interval resolution of the cavitating right upper lobe consolidation seen previously, with postinflammatory scarring noted. No right-sided airspace disease, effusion, or pneumothorax. Upper Abdomen: No acute abnormality. Musculoskeletal: Postsurgical changes within the cervical and thoracic spine. Spinal stimulator in place. No acute displaced fractures. IMPRESSION: 1. Masslike consolidation left upper lobe with areas of central gas lucency, favor cavitating pneumonia over malignancy given rapid development since 11/28/2022 exam. 2. Trace left hydropneumothorax as above, with mild diffuse left pleural thickening. Indwelling pigtail pleural drainage catheter within the left medial costophrenic angle. 3. Interval resolution of the cavitating consolidation within the right upper lobe on prior study, with resulting postinflammatory scarring noted. 4. Aortic Atherosclerosis (ICD10-I70.0). Coronary artery atherosclerosis. Electronically Signed   By: Bobbye Burrow M.D.   On: 05/24/2023 15:57   DG CHEST  PORT 1 VIEW Result Date: 05/24/2023 CLINICAL DATA:  Follow-up left pneumothorax and chest tube. EXAM: PORTABLE CHEST 1 VIEW COMPARISON:  05/23/2023 FINDINGS: The previously demonstrated small left lateral hydropneumothorax is slightly larger, measuring 1.3 cm in thickness today, previously 0.8 cm. There is a better defined adjacent rounded mass-like density. Decreased patchy density in the left lower lung zone. Stable left lower lung zone pigtail pleural catheter. Clear right lung. Normal-sized heart. Neural stimulator leads overlying the midthoracic spine. Cervical and lumbar spine fixation hardware. Lower thoracic spine degenerative changes. IMPRESSION: 1. Slightly larger small left lateral hydropneumothorax, currently occupying approximately 5% of the left hemithorax. 2. Better defined adjacent rounded mass-like density. 3. Decreased patchy density in the left lower lung zone, likely due to resolving atelectasis. Electronically Signed   By: Catherin Closs M.D.   On: 05/24/2023 11:50    Scheduled Meds:  acetaminophen   650 mg Oral Q6H WA   budesonide -glycopyrrolate -formoterol   2 puff Inhalation BID   enoxaparin (LOVENOX) injection  40 mg Subcutaneous Q24H   fluticasone   1 spray Each Nare Daily   insulin aspart  0-15 Units Subcutaneous TID WC   insulin aspart  0-5 Units Subcutaneous QHS   ketorolac   15 mg Intravenous Q6H   metoprolol  tartrate  25 mg Oral BID   montelukast   10 mg Oral QHS   sodium chloride  flush  10 mL Intrapleural Q8H   Continuous Infusions:  piperacillin-tazobactam (ZOSYN)  IV 3.375 g (05/25/23 0912)     LOS: 3 days   Time spent:  50 minutes  Unk Garb, DO  [facility]  05/25/2023, 12:22 PM

## 2023-05-25 NOTE — Progress Notes (Signed)
 Mobility Specialist - Progress Note   05/25/23 1403  Mobility  Activity Ambulated with assistance in hallway  Level of Assistance Contact guard assist, steadying assist  Assistive Device Front wheel walker  Distance Ambulated (ft) 150 ft  Range of Motion/Exercises Active  Activity Response Tolerated well  Mobility Referral Yes  Mobility visit 1 Mobility  Mobility Specialist Start Time (ACUTE ONLY) 1350  Mobility Specialist Stop Time (ACUTE ONLY) 1403  Mobility Specialist Time Calculation (min) (ACUTE ONLY) 13 min   Pt was found in bed and agreeable to ambulate. No complaints with session and returned to bed with all needs met. Call bell in reach and family in room.  Lorna Rose,  Mobility Specialist Can be reached via Secure Chat

## 2023-05-26 ENCOUNTER — Inpatient Hospital Stay (HOSPITAL_COMMUNITY)

## 2023-05-26 DIAGNOSIS — J869 Pyothorax without fistula: Secondary | ICD-10-CM | POA: Diagnosis not present

## 2023-05-26 DIAGNOSIS — I10 Essential (primary) hypertension: Secondary | ICD-10-CM | POA: Diagnosis not present

## 2023-05-26 DIAGNOSIS — T402X5A Adverse effect of other opioids, initial encounter: Secondary | ICD-10-CM

## 2023-05-26 DIAGNOSIS — K5903 Drug induced constipation: Secondary | ICD-10-CM

## 2023-05-26 DIAGNOSIS — E66812 Obesity, class 2: Secondary | ICD-10-CM | POA: Diagnosis not present

## 2023-05-26 DIAGNOSIS — J189 Pneumonia, unspecified organism: Secondary | ICD-10-CM | POA: Diagnosis not present

## 2023-05-26 LAB — BODY FLUID CULTURE W GRAM STAIN

## 2023-05-26 LAB — GLUCOSE, CAPILLARY
Glucose-Capillary: 110 mg/dL — ABNORMAL HIGH (ref 70–99)
Glucose-Capillary: 94 mg/dL (ref 70–99)
Glucose-Capillary: 131 mg/dL — ABNORMAL HIGH (ref 70–99)
Glucose-Capillary: 96 mg/dL (ref 70–99)

## 2023-05-26 MED ORDER — POLYETHYLENE GLYCOL 3350 17 G PO PACK
34.0000 g | PACK | Freq: Once | ORAL | Status: AC
  Administered 2023-05-26: 34 g via ORAL
  Filled 2023-05-26: qty 2

## 2023-05-26 MED ORDER — NALOXEGOL OXALATE 25 MG PO TABS
25.0000 mg | ORAL_TABLET | Freq: Once | ORAL | Status: AC
  Administered 2023-05-26: 25 mg via ORAL
  Filled 2023-05-26: qty 1

## 2023-05-26 MED ORDER — SACCHAROMYCES BOULARDII 250 MG PO CAPS
250.0000 mg | ORAL_CAPSULE | Freq: Two times a day (BID) | ORAL | Status: DC
  Administered 2023-05-26 – 2023-05-28 (×5): 250 mg via ORAL
  Filled 2023-05-26 (×5): qty 1

## 2023-05-26 MED ORDER — LINACLOTIDE 145 MCG PO CAPS
290.0000 ug | ORAL_CAPSULE | Freq: Once | ORAL | Status: AC
  Administered 2023-05-26: 290 ug via ORAL
  Filled 2023-05-26: qty 2

## 2023-05-26 MED ORDER — IRBESARTAN 150 MG PO TABS
150.0000 mg | ORAL_TABLET | Freq: Every day | ORAL | Status: DC
  Administered 2023-05-26 – 2023-05-28 (×3): 150 mg via ORAL
  Filled 2023-05-26 (×3): qty 1

## 2023-05-26 MED ORDER — FINASTERIDE 5 MG PO TABS
5.0000 mg | ORAL_TABLET | Freq: Every day | ORAL | Status: DC
  Administered 2023-05-26 – 2023-05-28 (×3): 5 mg via ORAL
  Filled 2023-05-26 (×3): qty 1

## 2023-05-26 MED ORDER — BISACODYL 10 MG RE SUPP: 10.0000 mg | Freq: Once | RECTAL | Status: DC

## 2023-05-26 MED ORDER — TAMSULOSIN HCL 0.4 MG PO CAPS
0.4000 mg | ORAL_CAPSULE | Freq: Two times a day (BID) | ORAL | Status: DC
  Administered 2023-05-26 – 2023-05-28 (×5): 0.4 mg via ORAL
  Filled 2023-05-26 (×5): qty 1

## 2023-05-26 MED ORDER — LABETALOL HCL 5 MG/ML IV SOLN
10.0000 mg | INTRAVENOUS | Status: DC | PRN
  Administered 2023-05-26 – 2023-05-27 (×2): 10 mg via INTRAVENOUS
  Filled 2023-05-26 (×2): qty 4

## 2023-05-26 MED ORDER — LACTULOSE 10 GM/15ML PO SOLN
30.0000 g | Freq: Once | ORAL | Status: AC
  Administered 2023-05-26: 30 g via ORAL
  Filled 2023-05-26: qty 60

## 2023-05-26 NOTE — Assessment & Plan Note (Signed)
 05-26-2023 po dulcolax did not work. Will give movantik, linzess and other laxatives to stimulate BM.  05-27-2023 BM x 2 yesterday. Resolved.

## 2023-05-26 NOTE — Progress Notes (Signed)
 Mobility Specialist - Progress Note   05/26/23 1124  Mobility  Activity Ambulated with assistance in hallway  Level of Assistance Contact guard assist, steadying assist  Assistive Device Front wheel walker  Distance Ambulated (ft) 150 ft  Range of Motion/Exercises Active  Activity Response Tolerated well  Mobility Referral Yes  Mobility visit 1 Mobility  Mobility Specialist Start Time (ACUTE ONLY) 1110  Mobility Specialist Stop Time (ACUTE ONLY) 1124  Mobility Specialist Time Calculation (min) (ACUTE ONLY) 14 min   Pt was found in bed and agreeable to ambulate. No complaints with session. Returned to bed with all needs met. Call bell in reach and wife in room.  Lorna Rose,  Mobility Specialist Can be reached via Secure Chat

## 2023-05-26 NOTE — Progress Notes (Signed)
 PROGRESS NOTE    Cody Tate  ZOX:[id number] DOB: [birth year] DOA: 05/22/2023 PCP: Benedetta Bradley, MD  Subjective: Pt seen and examined. Met with pt and his wife at bedside. Pt sitting in recliner. On RA.  Chest tube removed yesterday by PCCM.  Pt walked 150 feet with PT.  He remains on RA  Awaiting final sensitivities for pseudomonas growing from his respiratory cultures.   Hospital Course: HPI: Cody Tate is a 73 y.o. male with medical history significant for COPD on room air, hyperlipidemia, CAD, hypertension recent outpatient treatment for COPD exacerbation and suspected pneumonia being admitted to the hospital with acute hypoxic respiratory failure and sepsis.  He was recently treated by pulmonology as an outpatient with courses of p.o. Levaquin  and doxycycline , he seemed to improve overall and was stable on room air.  Starting about 3 days ago, he was intermittently agitated but much according to his wife, seem to be slightly confused yesterday.  Also yesterday, he started having a frothy cough and feeling short of breath.  He had a temperature of 101 yesterday.  This morning, he also seemed a little bit lethargic/confused, not really answering the questions appropriately but this was somewhat transient.  Patient and wife deny any history of nausea, vomiting, chest pain, diarrhea or other concerns.   Significant Events: Admitted 05/22/2023 for acute hypoxic respiratory failure 05-22-2023 chest tube inserted by PCCM 05-21 thru 5-23 had daily intra-pleural lytics 05-25-2023 chest tube removed.  Admission Labs: Lactic acid 3.0 Na 135, K 3.2, CO2 of 23, BUN 16, Scr 1.15, glu 108 WBC 30.4, HgB 15.9, plt 451, >20% bands BNP 165 Procal 8.7  Admission Imaging Studies: CXR Moderate left pleural effusion with opacification of the majority of the left mid to lower lung field  Significant Labs: Pleural fluid analysis. Fluid LDH 1017, protein 3.9, gluc <20, >10K  nucleated cells. 63% neutrophils Strep Pneumo antigen Negative  Significant Imaging Studies:   Antibiotic Therapy: Anti-infectives (From admission, onward)    Start     Dose/Rate Route Frequency Ordered Stop   05/23/23 1400  Vancomycin  (VANCOCIN ) 1,250 mg in sodium chloride  0.9 % 250 mL IVPB        1,250 mg 166.7 mL/hr over 90 Minutes Intravenous Every 24 hours 05/22/23 1252     05/22/23 1800  ceFEPIme (MAXIPIME) 2 g in sodium chloride  0.9 % 100 mL IVPB  Status:  Discontinued        2 g 200 mL/hr over 30 Minutes Intravenous Every 8 hours 05/22/23 1252 05/22/23 1528   05/22/23 1700  piperacillin-tazobactam (ZOSYN) IVPB 3.375 g        3.375 g 12.5 mL/hr over 240 Minutes Intravenous Every 8 hours 05/22/23 1544     05/22/23 1300  vancomycin  (VANCOCIN ) IVPB 1000 mg/200 mL premix        1,000 mg 200 mL/hr over 60 Minutes Intravenous  Once 05/22/23 1252 05/22/23 1538   05/22/23 1100  vancomycin  (VANCOCIN ) IVPB 1000 mg/200 mL premix        1,000 mg 200 mL/hr over 60 Minutes Intravenous Every 2 hours while awake 05/22/23 1028 05/22/23 1559   05/22/23 1030  piperacillin-tazobactam (ZOSYN) IVPB 3.375 g        3.375 g 100 mL/hr over 30 Minutes Intravenous  Once 05/22/23 1028 05/22/23 1116       Procedures: 05-22-2023 chest tube insertion 05-22-2023 pleural fibrinolysis #1 05-23-2023 pleural fibrinolysis #2 05-24-2023 pleural fibrinolysis #3 05-25-2023 chest tube removed.  Consultants: PCCM  Assessment and Plan: * Sepsis with acute organ dysfunction without septic shock (HCC) On admission. meeting criteria with leukocytosis, tachycardia, fever 101 at home.  Source is suspected community-acquired pneumonia/empyema.  Chest x-ray as above with moderate left pleural effusion. -Inpatient admission to progressive -Follow-up blood cultures -Empiric broad-spectrum IV antibiotics  05-23-2023 not hypotensive. WBC still 36K. Awaiting blood, sputum, pleural fluid cultures. On  Zosyn/Vanco.  05-25-2023 WBC down to 18. BP normal. No fevers. Sepsis is resolved.  Parapneumonic effusion 05-23-2023 left pigtail chest tube inserted yesterday. On Day #2 of pleural lytics. Already has had out at least 2 liters from chest tube.  05-24-2023 awaiting PCCM management of chest tube/pleural lytics.  05-25-2023 has had total of 3 doses of intra-pleural lytics. Awaiting PCCM to pulled chest tube. CT chest shows cavitary pneumonia.  05-26-2023 resolved. Chest tube pulled yesterday. PCCM thinks pt's CAP caused by pseudomonas. Awaiting final sensitivities for pseudomonas growing from his respiratory cultures. He remains on IV zosyn.  CAP (community acquired pneumonia) 05-23-2023 not hypotensive. WBC still 36K. Awaiting blood, sputum, pleural fluid cultures. On Zosyn/Vanco.   05-24-2023 WBC 27K. Remains on Zosyn/vanco. Blood, sputum, pleural fluid cultures negative thus far.  05-25-2023 WBC down to 18. On Zosyn only. Vanco stopped yesterday. Trach aspirate only growing rare pseudomonas. Pleural fluid and blood cx are negative.  05-26-2023 PCCM thinks pt's CAP caused by pseudomonas. Awaiting final sensitivities for pseudomonas growing from his respiratory cultures. He remains on IV zosyn.  Opioid-induced constipation 05-26-2023 po dulcolax did not work. Will give movantik, linzess and other laxatives to stimulate BM.  Acute hyponatremia 05-23-2023 Na 127 today. Was 135 yesterday. He received IVF with LR yesterday. He did not get any diuretics.  Will repeat BMP tomorrow. At this point, I think this is just a spurious lab value.  05-24-2023 Na 130 today. Was 127 yesterday. Continue to monitor.  05-25-2023 Na up to 132. Resolved.  Acute respiratory failure with hypoxia (HCC)-resolved as of 05/25/2023 05-23-2023 due to pneumonia and effusion. He is not on home O2. Continue with supplemental O2.  05-24-2023 resolved. Pt has been weaned to RA.  Swelling of lower limb 05-23-2023  chronic per pt and wife. Although wife states right leg is usually not this swollen.  05-24-2023 chronic.  05-25-2023 stable. Chronic.  05-26-2023 stable. Restarted lasix  20 mg yesterday  Opioid dependence (HCC) 05-23-2023 has valid Rx for oxycodone  10 mg q4h prn at home.  05-24-2023 continue oxycodone  prn.  05-25-2023 stable  05-26-2023 stable  Obesity, Class II, BMI 35-39.9 Estimated body mass index is 36.38 kg/m as calculated from the following:   Height as of 05/16/23: 5' 6.5" (1.689 m).   Weight as of 05/16/23: 103.8 kg.    Essential hypertension 05-23-2023 holding HTN meds due to sepsis.  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  BP controlled with much lower doses of lopressor (25 mg bid) than his usual home dose of Toprol -XL 100 mg daily. Continue to hold ARB and diuretic for now.  05-25-2023 BP on the rise today.  After chest tube pulled, may need to increase his HTN meds. Increased BP may be due to pain from chest tube.  05-26-2023 restarting his ARB.  Hypokalemia 05-23-2023 repleted. K 4.1 today. Resolved.  05-24-2023 K 3.3 today. Give more PO Kcl.  05-25-2023 resolved.   DVT prophylaxis: enoxaparin (LOVENOX) injection 40 mg Start: 05/22/23 2200    Code Status: Full Code Family Communication: discussed with pt and wife(June) at  bedside Disposition Plan: return home Reason for continuing need for hospitalization: awaiting final MIC on pseudomonas from sputum culture. Remains on IV Zosyn.  Objective: Vitals:   05/25/23 2026 05/25/23 2211 05/26/23 0520 05/26/23 0808  BP: (!) 180/66 (!) 159/66 (!) 177/89   Pulse: 88 72 88   Resp: 14  16   Temp: 98.2 F (36.8 C)  98.5 F (36.9 C)   TempSrc: Oral  Oral   SpO2: 100%  96% 96%    Intake/Output Summary (Last 24 hours) at 05/26/2023 1224 Last data filed at 05/26/2023 0844 Gross per 24 hour  Intake 240 ml  Output 2800 ml  Net -2560 ml    There were no vitals filed for this visit.  Examination:  Physical Exam Vitals and nursing note reviewed.  Constitutional:      Appearance: He is obese.  HENT:     Head: Normocephalic and atraumatic.     Nose: Nose normal.  Cardiovascular:     Rate and Rhythm: Normal rate and regular rhythm.  Pulmonary:     Effort: Pulmonary effort is normal.     Breath sounds: Normal breath sounds.  Abdominal:     General: Bowel sounds are normal. There is no distension.  Musculoskeletal:     Right lower leg: Edema present.     Left lower leg: Edema present.     Comments: +2 pitting bilateral LE. Chronic.  Skin:    General: Skin is warm and dry.     Capillary Refill: Capillary refill takes less than 2 seconds.  Neurological:     General: No focal deficit present.     Mental Status: He is alert and oriented to person, place, and time.     Data Reviewed: I have personally reviewed following labs and imaging studies  CBC: Recent Labs  Lab 05/22/23 1005 05/23/23 0419 05/24/23 0351 05/25/23 0428  WBC 30.4* 36.2* 27.6* 18.2*  NEUTROABS 26.2*  --  23.4* 15.8*  HGB 15.9 12.6* 13.3 13.8  HCT 48.0 40.6 42.0 42.5  MCV 90.2 94.6 92.1 89.9  PLT 451* 365 355 360   Basic Metabolic Panel: Recent Labs  Lab 05/22/23 1005 05/23/23 0419 05/24/23 0351 05/25/23 0428  NA 135 127* 130* 132*  K 3.2* 4.1 3.3* 4.0  CL 99 93* 95* 97*  CO2 23 25 25 25   GLUCOSE 108* 109* 102* 97  BUN 16 22 27* 23  CREATININE 1.15 1.05 0.70 0.63  CALCIUM  8.5* 8.0* 8.1* 8.3*  MG  --   --   --  2.3   GFR: Estimated Creatinine Clearance: 93.6 mL/min (by C-G formula based on SCr of 0.63 mg/dL). Liver Function Tests: Recent Labs  Lab 05/22/23 1005 05/24/23 0351  AST 37 44*  ALT 36 33  ALKPHOS 178* 137*  BILITOT 1.6* 0.6  PROT 6.1* 4.9*  ALBUMIN 2.4* 1.9*   Coagulation Profile: Recent Labs  Lab 05/22/23 1005  INR 1.2   BNP (last 3 results) Recent Labs    05/22/23 1005  BNP 165.8*    CBG: Recent Labs  Lab 05/25/23 1215 05/25/23 1715 05/25/23 2211 05/26/23 0726 05/26/23 1122  GLUCAP 151* 127* 132* 110* 94   Sepsis Labs: Recent Labs  Lab 05/22/23 1018 05/22/23 1420 05/22/23 1533 05/22/23 1813  PROCALCITON  --  8.71  --   --   LATICACIDVEN 3.0*  --  3.4* 3.6*    Recent Results (from the past 240 hours)  Culture, blood (Routine x 2)     Status:  None (Preliminary result)   Collection Time: 05/22/23 10:05 AM   Specimen: BLOOD LEFT ARM  Result Value Ref Range Status   Specimen Description   Final    BLOOD LEFT ARM Performed at [facility], [address] [place], [place] [address]    Special Requests   Final    BOTTLES DRAWN AEROBIC AND ANAEROBIC Blood Culture results may not be optimal due to an inadequate volume of blood received in culture bottles Performed at [facility], [address] [place], [place] [address]    Culture   Final    NO GROWTH 4 DAYS Performed at [facility], [address] [place], [place] [address]    Report Status PENDING  Incomplete  Culture, blood (Routine x 2)     Status: None (Preliminary result)   Collection Time: 05/22/23 10:09 AM   Specimen: BLOOD  Result Value Ref Range Status   Specimen Description   Final    BLOOD RIGHT ANTECUBITAL Performed at [facility], [address], [place], [place] [address]    Special Requests   Final    BOTTLES DRAWN AEROBIC AND ANAEROBIC Blood Culture results may not be optimal due to an inadequate volume of blood received in culture bottles Performed at [facility], [address], [place], [place] [address]    Culture   Final    NO GROWTH 4 DAYS Performed at [facility], [address], [place], [place] [address]    Report Status PENDING  Incomplete  Body fluid culture w Gram Stain     Status: None (Preliminary result)   Collection Time: 05/22/23  2:49 PM   Specimen: Pleural Fluid  Result Value  Ref Range Status   Specimen Description   Final    PLEURAL Performed at [facility], [address], [place], [place] [address]    Special Requests   Final    NONE Performed at [facility], [address] [place], [place] [address]    Gram Stain   Final    WBC PRESENT, PREDOMINANTLY PMN NO ORGANISMS SEEN CYTOSPIN SMEAR    Culture   Final    MODERATE STREPTOCOCCUS INTERMEDIUS SUSCEPTIBILITIES TO FOLLOW Performed at [facility], [address] [place], [place] [address]    Report Status PENDING  Incomplete  MRSA Next Gen by PCR, Nasal     Status: None   Collection Time: 05/22/23  5:35 PM   Specimen: Nasal Mucosa; Nasal Swab  Result Value Ref Range Status   MRSA by PCR Next Gen NOT DETECTED NOT DETECTED Final    Comment: (NOTE) The GeneXpert MRSA Assay (FDA approved for NASAL specimens only), is one component of a comprehensive MRSA colonization surveillance program. It is not intended to diagnose MRSA infection nor to guide or monitor treatment for MRSA infections. Test performance is not FDA approved in patients less than 21 years old. Performed at [facility], [address] [place], [place] [address]   Culture, Respiratory w Gram Stain     Status: None (Preliminary result)   Collection Time: 05/22/23  5:38 PM   Specimen: Tracheal Aspirate  Result Value Ref Range Status   Specimen Description   Final    TRACHEAL ASPIRATE Performed at [facility], [address] [place], [place] [address]    Special Requests   Final    NONE Performed at [facility], [address] [place], [place] [address]    Gram Stain   Final    ABUNDANT WBC PRESENT, PREDOMINANTLY PMN RARE GRAM POSITIVE COCCI RARE GRAM POSITIVE RODS    Culture   Final    RARE PSEUDOMONAS AERUGINOSA CULTURE REINCUBATED FOR BETTER GROWTH Performed at [facility], [address] [place], [place] [address]    Report  Status PENDING  Incomplete   Scheduled Meds:  acetaminophen   650 mg Oral Q6H WA   bisacodyl   10 mg Rectal Once   budesonide -glycopyrrolate -formoterol   2 puff Inhalation BID   enoxaparin (LOVENOX) injection  40 mg Subcutaneous Q24H   finasteride   5 mg Oral Daily   fluticasone   1 spray Each Nare Daily   furosemide   20 mg Oral Daily   insulin aspart  0-15 Units Subcutaneous TID WC   insulin aspart  0-5 Units Subcutaneous QHS   irbesartan   150 mg Oral Daily   linaclotide  290 mcg Oral Once   metoprolol  tartrate  25 mg Oral BID   montelukast   10 mg Oral QHS   naloxegol oxalate  25 mg Oral Once   sodium chloride  flush  10 mL Intrapleural Q8H   tamsulosin   0.4 mg Oral BID   Continuous Infusions:  piperacillin-tazobactam (ZOSYN)  IV 3.375 g (05/26/23 0852)     LOS: 4 days   Time spent: 45 minutes  Unk Garb, DO  [facility]  05/26/2023, 12:24 PM

## 2023-05-26 NOTE — Progress Notes (Signed)
 NAME:  Cody Tate, MRN:  [id number], DOB:  11/30/1950, LOS: 4 ADMISSION DATE:  05/22/2023, CONSULTATION DATE:  05/22/23 REFERRING MD:  Jannette Mend, CHIEF COMPLAINT:  SOB   History of Present Illness:  73 year old man w/ hx of smoking, COPD, prior abnormal CT in 2024 with R sided necrotic masses that resolved with abx presenting with about 3 weeks of worsening SOB, cough with intermittent hemoptysis.  Has taken course of doxy then levaquin .  Imaging initially with rounded mass-like left consolidation now with loculated effusion associated.  PCCM consulted to assist with management.   Pertinent  Medical History   Past Medical History:  Diagnosis Date   Acute heart failure with preserved ejection fraction (HCC) 11/12/2022   Arthritis    Back pain    Radiates down both legs   BPH (benign prostatic hyperplasia)    Cancer (HCC) 2022   prostate cancer   COPD (chronic obstructive pulmonary disease) (HCC)    Dyspnea    Enlarged prostate    GERD (gastroesophageal reflux disease)    Glaucoma    H/O hiatal hernia    H/O wheezing    occ inhaler usage   Heart murmur    Hypertension    Lumbar foraminal stenosis    Macular degeneration    Meningioma (HCC) 2014   Treated with radiation   Neuromuscular disorder (HCC)    Neuropathy bilateral legs   PONV (postoperative nausea and vomiting)    Pulmonary nodules    resolved on 2019 follow up CT   S/P insertion of spinal cord stimulator      Significant Hospital Events: Including procedures, antibiotic start and stop dates in addition to other pertinent events   5/21 admit, pigtail first pleural lytics administered 5/22 second dose pleural lytics administered 5/23 825 out of chest tube overnight, lytics #3 5/24 DC pigtail  Interim History / Subjective:   Denies chest pain or dyspnea 96% on room air   Objective    Blood pressure (!) 177/89, pulse 88, temperature 98.5 F (36.9 C), temperature source Oral, resp. rate 16, SpO2  96%.        Intake/Output Summary (Last 24 hours) at 05/26/2023 1007 Last data filed at 05/26/2023 0844 Gross per 24 hour  Intake 240 ml  Output 2800 ml  Net -2560 ml   There were no vitals filed for this visit.  Examination: General: Well-appearing elderly male out of bed to chair no acute distress HEENT: [place]/AT, MM pink/moist, PERRL,  Neuro: Alert, interactive, nonfocal CV: s1s2 regular rate and rhythm, no murmur, rubs, or gallops,  PULM: Decreased breath sounds on left, no accessory muscle use, GI: soft, nontender Extremities: warm/dry, no edema  Skin: no rashes or lesions  Labs show decreased leukocytosis 18K, mild hyponatremia CT chest confirms masslike consolidation left upper lobe with mild cavitation trace left hydropneumothorax, resolved consolidation right upper lobe from 11/2022 study  Resolved problem list  Sepsis  Acute respiratory failure with hypoxia  Assessment and Plan  Left CAP with strep intermedius empyema - Pseudomonas on respiratory culture -History of right-sided pneumonia 11/2022 P: Await sensitivities He will need extended course of antibiotics  for Pseudomonas pneumonia, was treated with Levaquin  as outpatient for 10 days prior to hospital admission, would suggest 3 to 4 weeks, continue Zosyn meantime.  -Unclear cause of recurrent pneumonia, he denies obvious aspiration episodes or reflux He will follow with his primary pulmonologist Updated patient and wife at bedside  PCCM available as needed   Alphonzo Ask  Kristen Petri MD [facility] Personal contact information can be found on Amion  If no contact or response made please call [phone] 05/26/2023, 10:07 AM

## 2023-05-27 DIAGNOSIS — J189 Pneumonia, unspecified organism: Secondary | ICD-10-CM | POA: Diagnosis not present

## 2023-05-27 DIAGNOSIS — K5903 Drug induced constipation: Secondary | ICD-10-CM | POA: Diagnosis not present

## 2023-05-27 DIAGNOSIS — I10 Essential (primary) hypertension: Secondary | ICD-10-CM | POA: Diagnosis not present

## 2023-05-27 DIAGNOSIS — E66812 Obesity, class 2: Secondary | ICD-10-CM | POA: Diagnosis not present

## 2023-05-27 LAB — CULTURE, BLOOD (ROUTINE X 2)
Culture: NO GROWTH
Culture: NO GROWTH

## 2023-05-27 LAB — GLUCOSE, CAPILLARY
Glucose-Capillary: 107 mg/dL — ABNORMAL HIGH (ref 70–99)
Glucose-Capillary: 143 mg/dL — ABNORMAL HIGH (ref 70–99)
Glucose-Capillary: 119 mg/dL — ABNORMAL HIGH (ref 70–99)
Glucose-Capillary: 155 mg/dL — ABNORMAL HIGH (ref 70–99)

## 2023-05-27 MED ORDER — MAGIC MOUTHWASH W/LIDOCAINE
15.0000 mL | Freq: Four times a day (QID) | ORAL | Status: DC
  Administered 2023-05-27 – 2023-05-28 (×4): 15 mL via ORAL
  Filled 2023-05-27 (×6): qty 15

## 2023-05-27 MED ORDER — FUROSEMIDE 20 MG PO TABS
20.0000 mg | ORAL_TABLET | Freq: Two times a day (BID) | ORAL | Status: DC
  Administered 2023-05-27 – 2023-05-28 (×2): 20 mg via ORAL
  Filled 2023-05-27 (×2): qty 1

## 2023-05-27 NOTE — Progress Notes (Signed)
 PROGRESS NOTE    Cody Tate  WUJ:[id number] DOB: [birth year] DOA: 05/22/2023 PCP: Benedetta Bradley, MD  Subjective: Pt seen and examined. Met with pt and his wife at bedside. Pt sitting in recliner. On RA.  Awaiting final MIC on pseudomonas.   Pt had 2 large BM yesterday after combo of movantik , linzess , miralax .   Hospital Course: HPI: Cody Tate is a 73 y.o. male with medical history significant for COPD on room air, hyperlipidemia, CAD, hypertension recent outpatient treatment for COPD exacerbation and suspected pneumonia being admitted to the hospital with acute hypoxic respiratory failure and sepsis.  He was recently treated by pulmonology as an outpatient with courses of p.o. Levaquin  and doxycycline , he seemed to improve overall and was stable on room air.  Starting about 3 days ago, he was intermittently agitated but much according to his wife, seem to be slightly confused yesterday.  Also yesterday, he started having a frothy cough and feeling short of breath.  He had a temperature of 101 yesterday.  This morning, he also seemed a little bit lethargic/confused, not really answering the questions appropriately but this was somewhat transient.  Patient and wife deny any history of nausea, vomiting, chest pain, diarrhea or other concerns.   Significant Events: Admitted 05/22/2023 for acute hypoxic respiratory failure 05-22-2023 chest tube inserted by PCCM 05-21 thru 5-23 had daily intra-pleural lytics 05-25-2023 chest tube removed.  Admission Labs: Lactic acid 3.0 Na 135, K 3.2, CO2 of 23, BUN 16, Scr 1.15, glu 108 WBC 30.4, HgB 15.9, plt 451, >20% bands BNP 165 Procal 8.7  Admission Imaging Studies: CXR Moderate left pleural effusion with opacification of the majority of the left mid to lower lung field  Significant Labs: Pleural fluid analysis. Fluid LDH 1017, protein 3.9, gluc <20, >10K nucleated cells. 63% neutrophils Strep Pneumo antigen  Negative  Significant Imaging Studies:   Antibiotic Therapy: Anti-infectives (From admission, onward)    Start     Dose/Rate Route Frequency Ordered Stop   05/23/23 1400  Vancomycin  (VANCOCIN ) 1,250 mg in sodium chloride  0.9 % 250 mL IVPB        1,250 mg 166.7 mL/hr over 90 Minutes Intravenous Every 24 hours 05/22/23 1252     05/22/23 1800  ceFEPIme (MAXIPIME) 2 g in sodium chloride  0.9 % 100 mL IVPB  Status:  Discontinued        2 g 200 mL/hr over 30 Minutes Intravenous Every 8 hours 05/22/23 1252 05/22/23 1528   05/22/23 1700  piperacillin -tazobactam (ZOSYN ) IVPB 3.375 g        3.375 g 12.5 mL/hr over 240 Minutes Intravenous Every 8 hours 05/22/23 1544     05/22/23 1300  vancomycin  (VANCOCIN ) IVPB 1000 mg/200 mL premix        1,000 mg 200 mL/hr over 60 Minutes Intravenous  Once 05/22/23 1252 05/22/23 1538   05/22/23 1100  vancomycin  (VANCOCIN ) IVPB 1000 mg/200 mL premix        1,000 mg 200 mL/hr over 60 Minutes Intravenous Every 2 hours while awake 05/22/23 1028 05/22/23 1559   05/22/23 1030  piperacillin -tazobactam (ZOSYN ) IVPB 3.375 g        3.375 g 100 mL/hr over 30 Minutes Intravenous  Once 05/22/23 1028 05/22/23 1116       Procedures: 05-22-2023 chest tube insertion 05-22-2023 pleural fibrinolysis #1 05-23-2023 pleural fibrinolysis #2 05-24-2023 pleural fibrinolysis #3 05-25-2023 chest tube removed.  Consultants: PCCM    Assessment and Plan: * Sepsis with acute organ dysfunction without  septic shock (HCC) On admission. meeting criteria with leukocytosis, tachycardia, fever 101 at home.  Source is suspected community-acquired pneumonia/empyema.  Chest x-ray as above with moderate left pleural effusion. -Inpatient admission to progressive -Follow-up blood cultures -Empiric broad-spectrum IV antibiotics  05-23-2023 not hypotensive. WBC still 36K. Awaiting blood, sputum, pleural fluid cultures. On Zosyn/Vanco.  05-25-2023 WBC down to 18. BP normal. No fevers.  Sepsis is resolved.  Parapneumonic effusion 05-23-2023 left pigtail chest tube inserted yesterday. On Day #2 of pleural lytics. Already has had out at least 2 liters from chest tube.  05-24-2023 awaiting PCCM management of chest tube/pleural lytics.  05-25-2023 has had total of 3 doses of intra-pleural lytics. Awaiting PCCM to pulled chest tube. CT chest shows cavitary pneumonia.  05-26-2023 resolved. Chest tube pulled yesterday. PCCM thinks pt's CAP caused by pseudomonas. Awaiting final sensitivities for pseudomonas growing from his respiratory cultures. He remains on IV zosyn.  05-27-2023 resolved.  CAP (community acquired pneumonia) 05-23-2023 not hypotensive. WBC still 36K. Awaiting blood, sputum, pleural fluid cultures. On Zosyn/Vanco.   05-24-2023 WBC 27K. Remains on Zosyn/vanco. Blood, sputum, pleural fluid cultures negative thus far.  05-25-2023 WBC down to 18. On Zosyn only. Vanco stopped yesterday. Trach aspirate only growing rare pseudomonas. Pleural fluid and blood cx are negative.  05-26-2023 PCCM thinks pt's CAP caused by pseudomonas. Awaiting final sensitivities for pseudomonas growing from his respiratory cultures. He remains on IV zosyn.  05-27-2023 awaiting final MIC on pseudomonas. PCCM wants him treated with additional 3 week at time of discharge if pseudomonas susceptible to quinolones.  Opioid-induced constipation 05-26-2023 po dulcolax did not work. Will give movantik, linzess and other laxatives to stimulate BM.  05-27-2023 BM x 2 yesterday. Resolved.  Acute hyponatremia 05-23-2023 Na 127 today. Was 135 yesterday. He received IVF with LR yesterday. He did not get any diuretics.  Will repeat BMP tomorrow. At this point, I think this is just a spurious lab value.  05-24-2023 Na 130 today. Was 127 yesterday. Continue to monitor.  05-25-2023 Na up to 132. Resolved.  Acute respiratory failure with hypoxia (HCC)-resolved as of 05/25/2023 05-23-2023 due to  pneumonia and effusion. He is not on home O2. Continue with supplemental O2.  05-24-2023 resolved. Pt has been weaned to RA.  Swelling of lower limb 05-23-2023 chronic per pt and wife. Although wife states right leg is usually not this swollen.  05-24-2023 chronic.  05-25-2023 stable. Chronic.  05-26-2023 stable. Restarted lasix  20 mg yesterday  05-27-2023 increase lasix  to 20 mg bid.  Opioid dependence (HCC) 05-23-2023 has valid Rx for oxycodone  10 mg q4h prn at home.  05-24-2023 continue oxycodone  prn.  05-25-2023 stable  05-26-2023 stable  05-27-2023 stable.  Obesity, Class II, BMI 35-39.9 Estimated body mass index is 36.38 kg/m as calculated from the following:   Height as of 05/16/23: 5' 6.5" (1.689 m).   Weight as of 05/16/23: 103.8 kg.    Essential hypertension 05-23-2023 holding HTN meds due to sepsis.  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  BP controlled with much lower doses of lopressor (25 mg bid) than his usual home dose of Toprol -XL 100 mg daily. Continue to hold ARB and diuretic for now.  05-25-2023 BP on the rise today.  After chest tube pulled, may need to increase his HTN meds. Increased BP may be due to pain from chest tube.  05-26-2023 restarting his ARB.  05-27-2023 BP improved with restarting ARB. Remains on lopressor  25 mg  bid. Can restart Toprol -XL 100 mg day at discharge.  Hypokalemia 05-23-2023 repleted. K 4.1 today. Resolved.  05-24-2023 K 3.3 today. Give more PO Kcl.  05-25-2023 resolved.   DVT prophylaxis: enoxaparin (LOVENOX) injection 40 mg Start: 05/22/23 2200    Code Status: Full Code Family Communication: met with pt and wife June at bedside Disposition Plan: return home Reason for continuing need for hospitalization: remains on IV Zosyn  Objective: Vitals:   05/26/23 2014 05/26/23 2112 05/27/23 0408 05/27/23 0940  BP: (!) 181/75 (!) 181/75 (!)  153/60   Pulse: 78 78 89   Resp: 19  18   Temp: 97.9 F (36.6 C)  98.3 F (36.8 C)   TempSrc:      SpO2: 99%  95% 94%    Intake/Output Summary (Last 24 hours) at 05/27/2023 1126 Last data filed at 05/27/2023 0900 Gross per 24 hour  Intake 240 ml  Output 1201 ml  Net -961 ml   There were no vitals filed for this visit.  Examination:  Physical Exam Vitals and nursing note reviewed.  Constitutional:      General: He is not in acute distress.    Appearance: He is obese. He is not toxic-appearing.  HENT:     Head: Normocephalic and atraumatic.  Cardiovascular:     Rate and Rhythm: Normal rate and regular rhythm.  Pulmonary:     Effort: Pulmonary effort is normal.     Breath sounds: Normal breath sounds.  Abdominal:     General: Bowel sounds are normal. There is no distension.     Palpations: Abdomen is soft.  Musculoskeletal:     Right lower leg: Edema present.     Left lower leg: Edema present.  Skin:    General: Skin is warm and dry.     Capillary Refill: Capillary refill takes less than 2 seconds.  Neurological:     General: No focal deficit present.     Mental Status: He is alert and oriented to person, place, and time.     Data Reviewed: I have personally reviewed following labs and imaging studies  CBC: Recent Labs  Lab 05/22/23 1005 05/23/23 0419 05/24/23 0351 05/25/23 0428  WBC 30.4* 36.2* 27.6* 18.2*  NEUTROABS 26.2*  --  23.4* 15.8*  HGB 15.9 12.6* 13.3 13.8  HCT 48.0 40.6 42.0 42.5  MCV 90.2 94.6 92.1 89.9  PLT 451* 365 355 360   Basic Metabolic Panel: Recent Labs  Lab 05/22/23 1005 05/23/23 0419 05/24/23 0351 05/25/23 0428  NA 135 127* 130* 132*  K 3.2* 4.1 3.3* 4.0  CL 99 93* 95* 97*  CO2 23 25 25 25   GLUCOSE 108* 109* 102* 97  BUN 16 22 27* 23  CREATININE 1.15 1.05 0.70 0.63  CALCIUM  8.5* 8.0* 8.1* 8.3*  MG  --   --   --  2.3   GFR: Estimated Creatinine Clearance: 93.6 mL/min (by C-G formula based on SCr of 0.63 mg/dL). Liver  Function Tests: Recent Labs  Lab 05/22/23 1005 05/24/23 0351  AST 37 44*  ALT 36 33  ALKPHOS 178* 137*  BILITOT 1.6* 0.6  PROT 6.1* 4.9*  ALBUMIN 2.4* 1.9*   Coagulation Profile: Recent Labs  Lab 05/22/23 1005  INR 1.2   BNP (last 3 results) Recent Labs    05/22/23 1005  BNP 165.8*   CBG: Recent Labs  Lab 05/26/23 0726 05/26/23 1122 05/26/23 1629 05/26/23 2105 05/27/23 0759  GLUCAP 110* 94 131* 96 107*   Sepsis  Labs: Recent Labs  Lab 05/22/23 1018 05/22/23 1420 05/22/23 1533 05/22/23 1813  PROCALCITON  --  8.71  --   --   LATICACIDVEN 3.0*  --  3.4* 3.6*    Recent Results (from the past 240 hours)  Culture, blood (Routine x 2)     Status: None   Collection Time: 05/22/23 10:05 AM   Specimen: BLOOD LEFT ARM  Result Value Ref Range Status   Specimen Description   Final    BLOOD LEFT ARM Performed at [facility], [address], [place], [place] [address]    Special Requests   Final    BOTTLES DRAWN AEROBIC AND ANAEROBIC Blood Culture results may not be optimal due to an inadequate volume of blood received in culture bottles Performed at [facility], [address] [place], [place] [address]    Culture   Final    NO GROWTH 5 DAYS Performed at [facility], [address] [place], [place] [address]    Report Status 05/27/2023 FINAL  Final  Culture, blood (Routine x 2)     Status: None   Collection Time: 05/22/23 10:09 AM   Specimen: BLOOD  Result Value Ref Range Status   Specimen Description   Final    BLOOD RIGHT ANTECUBITAL Performed at [facility], [address] [place], [place] [address]    Special Requests   Final    BOTTLES DRAWN AEROBIC AND ANAEROBIC Blood Culture results may not be optimal due to an inadequate volume of blood received in culture bottles Performed at [facility], [address] [place], [place] [address]    Culture   Final    NO GROWTH 5  DAYS Performed at [facility], [address], [place], [place] [address]    Report Status 05/27/2023 FINAL  Final  Body fluid culture w Gram Stain     Status: None   Collection Time: 05/22/23  2:49 PM   Specimen: Pleural Fluid  Result Value Ref Range Status   Specimen Description   Final    PLEURAL Performed at [facility], [address], [place], [place] [address]    Special Requests   Final    NONE Performed at [facility], [address] [place], [place] [address]    Gram Stain   Final    WBC PRESENT, PREDOMINANTLY PMN NO ORGANISMS SEEN CYTOSPIN SMEAR    Culture   Final    MODERATE STREPTOCOCCUS INTERMEDIUS RESULT CALLED TO, READ BACK BY AND VERIFIED WITH: RN ALEXIS PHILLIPPE [phone] [id number] BY Chestine Costain, MT Performed at [facility], [address], [place], [place] [address]    Report Status 05/26/2023 FINAL  Final   Organism ID, Bacteria STREPTOCOCCUS INTERMEDIUS  Final      Susceptibility   Streptococcus intermedius - MIC*    PENICILLIN <=0.06 SENSITIVE Sensitive     CEFTRIAXONE  <=0.12 SENSITIVE Sensitive     ERYTHROMYCIN >=8 RESISTANT Resistant     LEVOFLOXACIN  <=0.25 SENSITIVE Sensitive     VANCOMYCIN  0.5 SENSITIVE Sensitive     * MODERATE STREPTOCOCCUS INTERMEDIUS  MRSA Next Gen by PCR, Nasal     Status: None   Collection Time: 05/22/23  5:35 PM   Specimen: Nasal Mucosa; Nasal Swab  Result Value Ref Range Status   MRSA by PCR Next Gen NOT DETECTED NOT DETECTED Final    Comment: (NOTE) The GeneXpert MRSA Assay (FDA  approved for NASAL specimens only), is one component of a comprehensive MRSA colonization surveillance program. It is not intended to diagnose MRSA infection nor to guide or monitor treatment for MRSA infections. Test performance is not FDA approved in patients less than 13 years old. Performed at [facility], [address], [place], [place] [address]   Culture, Respiratory w Gram Stain      Status: None (Preliminary result)   Collection Time: 05/22/23  5:38 PM   Specimen: Tracheal Aspirate  Result Value Ref Range Status   Specimen Description   Final    TRACHEAL ASPIRATE Performed at [facility], [address], [place], [place] [address]    Special Requests   Final    NONE Performed at [facility], [address] [place], [place] [address]    Gram Stain   Final    ABUNDANT WBC PRESENT, PREDOMINANTLY PMN RARE GRAM POSITIVE COCCI RARE GRAM POSITIVE RODS    Culture   Final    RARE PSEUDOMONAS AERUGINOSA CULTURE REINCUBATED FOR BETTER GROWTH Performed at [facility], [address], [place], [place] [address]    Report Status PENDING  Incomplete     Radiology Studies: DG CHEST PORT 1 VIEW Result Date: 05/26/2023 CLINICAL DATA:  Empyema. EXAM: PORTABLE CHEST 1 VIEW COMPARISON:  05/24/2023 FINDINGS: Lungs are adequately inflated with stable elevation of the left hemidiaphragm. Right lung is clear. Persistent masslike opacification over the lateral left mid to lower lung. Persistent pleural thickening with possible small persistent collection of air along the lateral mid to lower left thorax likely representing patient's persistent small hydropneumothorax. Cardiomediastinal silhouette and remainder of the exam is unchanged. IMPRESSION: Persistent masslike opacification over the lateral left mid to lower lung with persistent pleural thickening including possible small persistent collection of air along the lateral mid to lower left thorax likely representing patient's persistent small hydropneumothorax. Electronically Signed   By: Roda Cirri M.D.   On: 05/26/2023 14:45    Scheduled Meds:  acetaminophen   650 mg Oral Q6H WA   bisacodyl   10 mg Rectal Once   budesonide -glycopyrrolate -formoterol   2 puff Inhalation BID   enoxaparin (LOVENOX) injection  40 mg Subcutaneous Q24H   finasteride   5 mg Oral Daily   fluticasone   1 spray Each Nare  Daily   furosemide   20 mg Oral BID   insulin aspart  0-15 Units Subcutaneous TID WC   insulin aspart  0-5 Units Subcutaneous QHS   irbesartan   150 mg Oral Daily   metoprolol  tartrate  25 mg Oral BID   montelukast   10 mg Oral QHS   saccharomyces boulardii  250 mg Oral BID   sodium chloride  flush  10 mL Intrapleural Q8H   tamsulosin   0.4 mg Oral BID   Continuous Infusions:  piperacillin-tazobactam (ZOSYN)  IV 3.375 g (05/27/23 0821)     LOS: 5 days   Time spent: 50 minutes  Unk Garb, DO  [facility]  05/27/2023, 11:26 AM

## 2023-05-27 NOTE — Progress Notes (Signed)
 Physical Therapy Treatment Patient Details Name: Cody Tate MRN: [id number] DOB: 10/30/1950 Today's Date: 05/27/2023   History of Present Illness 73 y.o. male being admitted to the hospital with acute hypoxic respiratory failure and sepsis. Past medical history significant for COPD on room air, hyperlipidemia, CAD, hypertension recent outpatient treatment for COPD exacerbation and suspected pneumonia    PT Comments  AxO x 3 pleasant and motivated.  Lives with supportive Spouse.  Amb with a Rollator at baseline and uses a LIFT CHAIR.  Pt also has a scooter fro community use.    Today, Pt was OOB in recliner "feeling better".  Pt asking about going home vs facility.  General Gait Details: Pt tolerated an increased distance 145 feet at Supervision with walker, slow and steady.  Avg HR 92 and RA increased from 96% at rest to 99%.  No dyspnea.  No cough.  Tolerated distance well.  Balance with walker is good.   Educated and instructed on B LE TE's of AP and knee presses to improve circulation as well as assist with LE edema.    Pt appears close to his baseline.   B LE still present with much swelling B LE although improved from admit.  Will consult/update LPT re change in rec from SNF to [facility].  NO equipment needs (has all)    If plan is discharge home, recommend the following: A little help with walking and/or transfers;A little help with bathing/dressing/bathroom;Assistance with cooking/housework;Assist for transportation;Help with stairs or ramp for entrance   Can travel by private vehicle     Yes  Equipment Recommendations  None recommended by PT    Recommendations for Other Services       Precautions / Restrictions Precautions Precautions: Fall Precaution/Restrictions Comments: Hx Falls, LIFT CHAIR, Rollator, Back Sz Restrictions Weight Bearing Restrictions Per Provider Order: No     Mobility  Bed Mobility               General bed mobility comments: OOB in  recliner    Transfers Overall transfer level: Needs assistance Equipment used: Rolling walker (2 wheels) Transfers: Sit to/from Stand Sit to Stand: Supervision           General transfer comment: Pt self able to rise from recliner but needed VC's to "push up" vs "pull up" on walker.  Slow/causious and steady with turns.    Ambulation/Gait Ambulation/Gait assistance: Supervision Gait Distance (Feet): 145 Feet Assistive device: Rolling walker (2 wheels) Gait Pattern/deviations: Step-through pattern, Decreased stride length, Trunk flexed Gait velocity: decr     General Gait Details: Pt tolerated an increased distance 145 feet at Supervision with walker, slow and steady.  Avg HR 92 and RA increased from 96% at rest to 99%.  No dyspnea.  No cough.  Tolerated distance well.  Balance with walker is good.  Mild deconditioning but appears close to baseline.  B LE still present with much swelling although improved from admit.   Stairs             Wheelchair Mobility     Tilt Bed    Modified Rankin (Stroke Patients Only)       Balance                                            Communication Communication Communication: No apparent difficulties  Cognition Arousal: Alert Behavior During  Therapy: WFL for tasks assessed/performed   PT - Cognitive impairments: No apparent impairments                       PT - Cognition Comments: AxO x 3 pleasant and motivated.  Lives with supportive Spouse.  Amb with a Rollator at baseline and uses a LIFT CHAIR Following commands: Intact      Cueing Cueing Techniques: Verbal cues  Exercises  20 reps AP  10 reps knee presses    General Comments        Pertinent Vitals/Pain Pain Assessment Pain Assessment: Faces Faces Pain Scale: Hurts a little bit Pain Location: L side and back Pain Descriptors / Indicators: Aching Pain Intervention(s): Monitored during session, Repositioned    Home Living                           Prior Function            PT Goals (current goals can now be found in the care plan section) Progress towards PT goals: Progressing toward goals    Frequency    Min 3X/week      PT Plan      Co-evaluation              AM-PAC PT "6 Clicks" Mobility   Outcome Measure  Help needed turning from your back to your side while in a flat bed without using bedrails?: None Help needed moving from lying on your back to sitting on the side of a flat bed without using bedrails?: None Help needed moving to and from a bed to a chair (including a wheelchair)?: None Help needed standing up from a chair using your arms (e.g., wheelchair or bedside chair)?: None Help needed to walk in hospital room?: None Help needed climbing 3-5 steps with a railing? : A Little 6 Click Score: 23    End of Session Equipment Utilized During Treatment: Gait belt Activity Tolerance: Patient tolerated treatment well Patient left: in chair;with chair alarm set;with call bell/phone within reach;with family/visitor present Nurse Communication: Mobility status PT Visit Diagnosis: Difficulty in walking, not elsewhere classified (R26.2);Pain;History of falling (Z91.81);Unsteadiness on feet (R26.81)     Time: 8295-6213 PT Time Calculation (min) (ACUTE ONLY): 24 min  Charges:    $Gait Training: 8-22 mins $Therapeutic Activity: 8-22 mins PT General Charges $$ ACUTE PT VISIT: 1 Visit                     Bess Broody  PTA Acute  Rehabilitation Services Office M-F          [phone]

## 2023-05-27 NOTE — TOC Progression Note (Signed)
 Transition of Care [facility]) - Progression Note    Patient Details  Name: Cody Tate MRN: [id number] Date of Birth: [birth year]  [facility] CM/SW Contact  Gertha Ku, LCSW Phone Number: 05/27/2023, 12:02 PM  Clinical Narrative:    CSW spoke with pt and pt's wife , they have declined SNF and would like to return home with home health. HH choice list given. They are requesting time to speak with a friend about preferred agency.TOC to follow.    Expected Discharge Plan:  (TBD) Barriers to Discharge: Continued Medical Work up  Expected Discharge Plan and Services       Living arrangements for the past 2 months: Single Family Home                                       Social Determinants of Health (SDOH) Interventions SDOH Screenings   Food Insecurity: No Food Insecurity (05/22/2023)  Housing: Low Risk  (05/22/2023)  Transportation Needs: No Transportation Needs (05/22/2023)  Utilities: Not At Risk (05/22/2023)  Social Connections: Moderately Integrated (05/22/2023)  Tobacco Use: Medium Risk (05/16/2023)    Readmission Risk Interventions     No data to display

## 2023-05-28 ENCOUNTER — Other Ambulatory Visit (HOSPITAL_COMMUNITY)

## 2023-05-28 ENCOUNTER — Telehealth: Payer: Self-pay | Admitting: Internal Medicine

## 2023-05-28 ENCOUNTER — Encounter (HOSPITAL_COMMUNITY): Payer: Self-pay | Admitting: Internal Medicine

## 2023-05-28 DIAGNOSIS — J189 Pneumonia, unspecified organism: Secondary | ICD-10-CM | POA: Diagnosis not present

## 2023-05-28 DIAGNOSIS — E871 Hypo-osmolality and hyponatremia: Secondary | ICD-10-CM | POA: Diagnosis not present

## 2023-05-28 DIAGNOSIS — J151 Pneumonia due to Pseudomonas: Secondary | ICD-10-CM

## 2023-05-28 DIAGNOSIS — A419 Sepsis, unspecified organism: Secondary | ICD-10-CM | POA: Diagnosis not present

## 2023-05-28 DIAGNOSIS — J9601 Acute respiratory failure with hypoxia: Secondary | ICD-10-CM | POA: Diagnosis not present

## 2023-05-28 LAB — GLUCOSE, CAPILLARY
Glucose-Capillary: 109 mg/dL — ABNORMAL HIGH (ref 70–99)
Glucose-Capillary: 152 mg/dL — ABNORMAL HIGH (ref 70–99)
Glucose-Capillary: 125 mg/dL — ABNORMAL HIGH (ref 70–99)

## 2023-05-28 LAB — CULTURE, RESPIRATORY W GRAM STAIN

## 2023-05-28 MED ORDER — METOPROLOL SUCCINATE ER 100 MG PO TB24: 100.0000 mg | ORAL_TABLET | Freq: Every day | ORAL | 0 refills | Status: DC

## 2023-05-28 MED ORDER — ROSUVASTATIN CALCIUM 20 MG PO TABS: 20.0000 mg | ORAL_TABLET | Freq: Every day | ORAL | 0 refills | Status: AC

## 2023-05-28 MED ORDER — AMOXICILLIN-POT CLAVULANATE 875-125 MG PO TABS: 1.0000 | ORAL_TABLET | Freq: Two times a day (BID) | ORAL | 0 refills | Status: AC

## 2023-05-28 MED ORDER — CELECOXIB 200 MG PO CAPS: 200.0000 mg | ORAL_CAPSULE | Freq: Two times a day (BID) | ORAL | 0 refills | Status: AC

## 2023-05-28 MED ORDER — GABAPENTIN 300 MG PO CAPS: 300.0000 mg | ORAL_CAPSULE | Freq: Four times a day (QID) | ORAL | 0 refills | Status: AC

## 2023-05-28 MED ORDER — MAGNESIUM HYDROXIDE 400 MG/5ML PO SUSP: 30.0000 mL | Freq: Every day | ORAL | Status: DC

## 2023-05-28 MED ORDER — FUROSEMIDE 20 MG PO TABS: ORAL_TABLET | ORAL | 0 refills | Status: DC

## 2023-05-28 MED ORDER — EMPAGLIFLOZIN 10 MG PO TABS: 10.0000 mg | ORAL_TABLET | Freq: Every day | ORAL | 0 refills | Status: AC

## 2023-05-28 MED ORDER — BREZTRI AEROSPHERE 160-9-4.8 MCG/ACT IN AERO: 2.0000 | INHALATION_SPRAY | Freq: Two times a day (BID) | RESPIRATORY_TRACT | 11 refills | Status: DC

## 2023-05-28 MED ORDER — TELMISARTAN 80 MG PO TABS: 40.0000 mg | ORAL_TABLET | Freq: Two times a day (BID) | ORAL | 0 refills | Status: DC

## 2023-05-28 MED ORDER — MAGIC MOUTHWASH W/LIDOCAINE: 15.0000 mL | Freq: Four times a day (QID) | ORAL | 0 refills | Status: AC | PRN

## 2023-05-28 NOTE — TOC Transition Note (Signed)
 Transition of Care [facility]) - Discharge Note   Patient Details  Name: Cody Tate MRN: [id number] Date of Birth: 1950/01/31  Transition of Care [facility] CM/SW Contact:  Gertha Ku, LCSW Phone Number: 05/28/2023, 3:46 PM   Clinical Narrative:     CSW met with pt regarding home health choice. Pt has chosen [employer] for [employer]. Pt's requesting a rolling walker , with no preference in DME company. Referral sent to [facility] for walker to be delivered to pt's home.  [facility] for HHPT. TOC sign off.   Final next level of care: Home w Home Health Services Barriers to Discharge: Barriers Resolved   Patient Goals and CMS Choice Patient states their goals for this hospitalization and ongoing recovery are:: retrun home with hh          Discharge Placement                    Patient and family notified of of transfer: 05/28/23  Discharge Plan and Services Additional resources added to the After Visit Summary for                            [facility] Arranged: PT [facility] Agency: [facility] Date [facility] Agency Contacted: 05/28/23 Time HH Agency Contacted: [phone]    Social Drivers of Health (SDOH) Interventions SDOH Screenings   Food Insecurity: No Food Insecurity (05/22/2023)  Housing: Low Risk  (05/22/2023)  Transportation Needs: No Transportation Needs (05/22/2023)  Utilities: Not At Risk (05/22/2023)  Social Connections: Moderately Integrated (05/22/2023)  Tobacco Use: Medium Risk (05/28/2023)     Readmission Risk Interventions     No data to display

## 2023-05-28 NOTE — Telephone Encounter (Signed)
 Front desk: : Needs appt with APP in < 2 weeks and another appt in 8 weeks with Ramawamy - cancle the July 2025 aptp with APP  Leslie/Front desk - cancel upcoming CT chest May 2025 (he had one now in the hispital) but change it to CT chest in 6 weeks

## 2023-05-28 NOTE — Plan of Care (Signed)

## 2023-05-28 NOTE — Progress Notes (Signed)
 PROGRESS NOTE    Cody Tate  NWG:[id number] DOB: 02/17/1950 DOA: 05/22/2023 PCP: Benedetta [place], MD  Subjective: Pt seen and examined. Pt seen and examine. Discussed case with pt's outpatient pulmonologist Dr. Bertrum Brodie. He will see patient today.   Hospital Course: HPI: Cody Tate is a 73 y.o. male with medical history significant for COPD on room air, hyperlipidemia, CAD, hypertension recent outpatient treatment for COPD exacerbation and suspected pneumonia being admitted to the hospital with acute hypoxic respiratory failure and sepsis.  He was recently treated by pulmonology as an outpatient with courses of p.o. Levaquin  and doxycycline , he seemed to improve overall and was stable on room air.  Starting about 3 days ago, he was intermittently agitated but much according to his wife, seem to be slightly confused yesterday.  Also yesterday, he started having a frothy cough and feeling short of breath.  He had a temperature of 101 yesterday.  This morning, he also seemed a little bit lethargic/confused, not really answering the questions appropriately but this was somewhat transient.  Patient and wife deny any history of nausea, vomiting, chest pain, diarrhea or other concerns.   Significant Events: Admitted 05/22/2023 for acute hypoxic respiratory failure 05-22-2023 chest tube inserted by PCCM 05-21 thru 5-23 had daily intra-pleural lytics 05-25-2023 chest tube removed.  Admission Labs: Lactic acid 3.0 Na 135, K 3.2, CO2 of 23, BUN 16, Scr 1.15, glu 108 WBC 30.4, HgB 15.9, plt 451, >20% bands BNP 165 Procal 8.7  Admission Imaging Studies: CXR Moderate left pleural effusion with opacification of the majority of the left mid to lower lung field  Significant Labs: Pleural fluid analysis. Fluid LDH 1017, protein 3.9, gluc <20, >10K nucleated cells. 63% neutrophils Strep Pneumo antigen Negative  Significant Imaging Studies:   Antibiotic Therapy: Anti-infectives  (From admission, onward)    Start     Dose/Rate Route Frequency Ordered Stop   05/23/23 1400  Vancomycin  (VANCOCIN ) 1,250 mg in sodium chloride  0.9 % 250 mL IVPB        1,250 mg 166.7 mL/hr over 90 Minutes Intravenous Every 24 hours 05/22/23 1252     05/22/23 1800  ceFEPIme (MAXIPIME) 2 g in sodium chloride  0.9 % 100 mL IVPB  Status:  Discontinued        2 g 200 mL/hr over 30 Minutes Intravenous Every 8 hours 05/22/23 1252 05/22/23 1528   05/22/23 1700  piperacillin-tazobactam (ZOSYN) IVPB 3.375 g        3.375 g 12.5 mL/hr over 240 Minutes Intravenous Every 8 hours 05/22/23 1544     05/22/23 1300  vancomycin  (VANCOCIN ) IVPB 1000 mg/200 mL premix        1,000 mg 200 mL/hr over 60 Minutes Intravenous  Once 05/22/23 1252 05/22/23 1538   05/22/23 1100  vancomycin  (VANCOCIN ) IVPB 1000 mg/200 mL premix        1,000 mg 200 mL/hr over 60 Minutes Intravenous Every 2 hours while awake 05/22/23 1028 05/22/23 1559   05/22/23 1030  piperacillin-tazobactam (ZOSYN) IVPB 3.375 g        3.375 g 100 mL/hr over 30 Minutes Intravenous  Once 05/22/23 1028 05/22/23 1116       Procedures: 05-22-2023 chest tube insertion 05-22-2023 pleural fibrinolysis #1 05-23-2023 pleural fibrinolysis #2 05-24-2023 pleural fibrinolysis #3 05-25-2023 chest tube removed.  Consultants: PCCM    Assessment and Plan: * Sepsis with acute organ dysfunction without septic shock (HCC) On admission. meeting criteria with leukocytosis, tachycardia, fever 101 at home.  Source is  suspected community-acquired pneumonia/empyema.  Chest x-ray as above with moderate left pleural effusion. -Inpatient admission to progressive -Follow-up blood cultures -Empiric broad-spectrum IV antibiotics  05-23-2023 not hypotensive. WBC still 36K. Awaiting blood, sputum, pleural fluid cultures. On Zosyn/Vanco.  05-25-2023 WBC down to 18. BP normal. No fevers. Sepsis is resolved.  Parapneumonic effusion 05-23-2023 left pigtail chest tube  inserted yesterday. On Day #2 of pleural lytics. Already has had out at least 2 liters from chest tube.  05-24-2023 awaiting PCCM management of chest tube/pleural lytics.  05-25-2023 has had total of 3 doses of intra-pleural lytics. Awaiting PCCM to pulled chest tube. CT chest shows cavitary pneumonia.  05-26-2023 resolved. Chest tube pulled yesterday. PCCM thinks pt's CAP caused by pseudomonas. Awaiting final sensitivities for pseudomonas growing from his respiratory cultures. He remains on IV zosyn.  05-27-2023 resolved.  CAP (community acquired pneumonia) 05-23-2023 not hypotensive. WBC still 36K. Awaiting blood, sputum, pleural fluid cultures. On Zosyn/Vanco.   05-24-2023 WBC 27K. Remains on Zosyn/vanco. Blood, sputum, pleural fluid cultures negative thus far.  05-25-2023 WBC down to 18. On Zosyn only. Vanco stopped yesterday. Trach aspirate only growing rare pseudomonas. Pleural fluid and blood cx are negative.  05-26-2023 PCCM thinks pt's CAP caused by pseudomonas. Awaiting final sensitivities for pseudomonas growing from his respiratory cultures. He remains on IV zosyn.  05-27-2023 awaiting final MIC on pseudomonas. PCCM wants him treated with additional 3 week at time of discharge if pseudomonas susceptible to quinolones.  05-28-2023 after further discussion with pharmacy and lab, pt's rare pseudomonas on his respiratory culture should have NOT been flagged as pathologic. Discussed with Dr. Bertrum Brodie. Ok to discharge him with po augmentin for 8 days(to complete total of 14 days of treatment). He will f/u with pulmonology in clinic in 2 weeks. Dr. Bertrum Brodie did not think bronchoscopy was indicated during current hospital stay.   Opioid-induced constipation 05-26-2023 po dulcolax did not work. Will give movantik, linzess and other laxatives to stimulate BM.  05-27-2023 BM x 2 yesterday. Resolved.  Acute hyponatremia 05-23-2023 Na 127 today. Was 135 yesterday. He received IVF with  LR yesterday. He did not get any diuretics.  Will repeat BMP tomorrow. At this point, I think this is just a spurious lab value.  05-24-2023 Na 130 today. Was 127 yesterday. Continue to monitor.  05-25-2023 Na up to 132. Resolved.  Acute respiratory failure with hypoxia (HCC)-resolved as of 05/25/2023 05-23-2023 due to pneumonia and effusion. He is not on home O2. Continue with supplemental O2.  05-24-2023 resolved. Pt has been weaned to RA.  Swelling of lower limb 05-23-2023 chronic per pt and wife. Although wife states right leg is usually not this swollen.  05-24-2023 chronic.  05-25-2023 stable. Chronic.  05-26-2023 stable. Restarted lasix  20 mg yesterday  05-27-2023 increase lasix  to 20 mg bid.  05-28-2023 DC to home with lasix  20 mg bid x 7 days then decrease to 20 mg daily.  Opioid dependence (HCC) 05-23-2023 has valid Rx for oxycodone  10 mg q4h prn at home.  05-24-2023 continue oxycodone  prn.  05-25-2023 stable  05-26-2023 stable  05-27-2023 stable.  Obesity, Class II, BMI 35-39.9 Estimated body mass index is 36.38 kg/m as calculated from the following:   Height as of 05/16/23: 5' 6.5" (1.689 m).   Weight as of 05/16/23: 103.8 kg.    Essential hypertension 05-23-2023 holding HTN meds due to sepsis.  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  BP controlled with much lower  doses of lopressor (25 mg bid) than his usual home dose of Toprol -XL 100 mg daily. Continue to hold ARB and diuretic for now.  05-25-2023 BP on the rise today.  After chest tube pulled, may need to increase his HTN meds. Increased BP may be due to pain from chest tube.  05-26-2023 restarting his ARB.  05-27-2023 BP improved with restarting ARB. Remains on lopressor  25 mg bid. Can restart Toprol -XL 100 mg day at discharge.  Hypokalemia 05-23-2023 repleted. K 4.1 today. Resolved.  05-24-2023 K 3.3 today. Give more PO  Kcl.  05-25-2023 resolved.  DVT prophylaxis: enoxaparin (LOVENOX) injection 40 mg Start: 05/22/23 2200    Code Status: Full Code Family Communication: discussed with pt's wife at bedside Disposition Plan: home Reason for continuing need for hospitalization: stable for DC.  Objective: Vitals:   05/27/23 2029 05/27/23 2131 05/28/23 0529 05/28/23 1010  BP: (!) 178/66 (!) 178/66 (!) 159/86   Pulse: 93 93 94   Resp: (!) 22  20   Temp: 98.4 F (36.9 C)  97.8 F (36.6 C)   TempSrc: Oral  Oral   SpO2: 96%  97%   Weight:    102.5 kg  Height:    5' 6.5" (1.689 m)    Intake/Output Summary (Last 24 hours) at 05/28/2023 1443 Last data filed at 05/28/2023 1100 Gross per 24 hour  Intake 10 ml  Output 2000 ml  Net -1990 ml   Filed Weights   05/28/23 1010  Weight: 102.5 kg    Examination:  Physical Exam Vitals and nursing note reviewed.  Constitutional:      Appearance: He is obese.  HENT:     Head: Normocephalic and atraumatic.  Eyes:     General: No scleral icterus. Pulmonary:     Effort: Pulmonary effort is normal. No respiratory distress.  Abdominal:     General: There is no distension.  Skin:    Capillary Refill: Capillary refill takes less than 2 seconds.  Neurological:     Mental Status: He is alert and oriented to person, place, and time.     Data Reviewed: I have personally reviewed following labs and imaging studies  CBC: Recent Labs  Lab 05/22/23 1005 05/23/23 0419 05/24/23 0351 05/25/23 0428  WBC 30.4* 36.2* 27.6* 18.2*  NEUTROABS 26.2*  --  23.4* 15.8*  HGB 15.9 12.6* 13.3 13.8  HCT 48.0 40.6 42.0 42.5  MCV 90.2 94.6 92.1 89.9  PLT 451* 365 355 360   Basic Metabolic Panel: Recent Labs  Lab 05/22/23 1005 05/23/23 0419 05/24/23 0351 05/25/23 0428  NA 135 127* 130* 132*  K 3.2* 4.1 3.3* 4.0  CL 99 93* 95* 97*  CO2 23 25 25 25   GLUCOSE 108* 109* 102* 97  BUN 16 22 27* 23  CREATININE 1.15 1.05 0.70 0.63  CALCIUM  8.5* 8.0* 8.1* 8.3*  MG   --   --   --  2.3   GFR: Estimated Creatinine Clearance: 92.9 mL/min (by C-G formula based on SCr of 0.63 mg/dL). Liver Function Tests: Recent Labs  Lab 05/22/23 1005 05/24/23 0351  AST 37 44*  ALT 36 33  ALKPHOS 178* 137*  BILITOT 1.6* 0.6  PROT 6.1* 4.9*  ALBUMIN 2.4* 1.9*   Coagulation Profile: Recent Labs  Lab 05/22/23 1005  INR 1.2   BNP (last 3 results) Recent Labs    05/22/23 1005  BNP 165.8*   CBG: Recent Labs  Lab 05/27/23 1138 05/27/23 1631 05/27/23 2026 05/28/23 0733 05/28/23 1124  GLUCAP 143* 119* 155* 109* 152*   Sepsis Labs: Recent Labs  Lab 05/22/23 1018 05/22/23 1420 05/22/23 1533 05/22/23 1813  PROCALCITON  --  8.71  --   --   LATICACIDVEN 3.0*  --  3.4* 3.6*    Recent Results (from the past 240 hours)  Culture, blood (Routine x 2)     Status: None   Collection Time: 05/22/23 10:05 AM   Specimen: BLOOD LEFT ARM  Result Value Ref Range Status   Specimen Description   Final    BLOOD LEFT ARM Performed at [facility], [address], [place], [place] [address]    Special Requests   Final    BOTTLES DRAWN AEROBIC AND ANAEROBIC Blood Culture results may not be optimal due to an inadequate volume of blood received in culture bottles Performed at [facility], [address], [place], [place] [address]    Culture   Final    NO GROWTH 5 DAYS Performed at [facility], [address] [place], [place] [address]    Report Status 05/27/2023 FINAL  Final  Culture, blood (Routine x 2)     Status: None   Collection Time: 05/22/23 10:09 AM   Specimen: BLOOD  Result Value Ref Range Status   Specimen Description   Final    BLOOD RIGHT ANTECUBITAL Performed at [facility], [address], [place], [place] [address]    Special Requests   Final    BOTTLES DRAWN AEROBIC AND ANAEROBIC Blood Culture results may not be optimal due to an inadequate volume of blood received in culture  bottles Performed at [facility], [address], [place], [place] [address]    Culture   Final    NO GROWTH 5 DAYS Performed at [facility], [address], [place], [place] [address]    Report Status 05/27/2023 FINAL  Final  Body fluid culture w Gram Stain     Status: None   Collection Time: 05/22/23  2:49 PM   Specimen: Pleural Fluid  Result Value Ref Range Status   Specimen Description   Final    PLEURAL Performed at [facility], [address] [place], [place] [address]    Special Requests   Final    NONE Performed at [facility], [address] [place], [place] [address]    Gram Stain   Final    WBC PRESENT, PREDOMINANTLY PMN NO ORGANISMS SEEN CYTOSPIN SMEAR    Culture   Final    MODERATE STREPTOCOCCUS INTERMEDIUS RESULT CALLED TO, READ BACK BY AND VERIFIED WITH: RN ALEXIS PHILLIPPE [phone] BY Chestine Costain, MT Performed at [facility], [address] [place], [place] [address]    Report Status 05/26/2023 FINAL  Final   Organism ID, Bacteria STREPTOCOCCUS INTERMEDIUS  Final      Susceptibility   Streptococcus intermedius - MIC*    PENICILLIN <=0.06 SENSITIVE Sensitive     CEFTRIAXONE  <=0.12 SENSITIVE Sensitive     ERYTHROMYCIN >=8 RESISTANT Resistant     LEVOFLOXACIN  <=0.25 SENSITIVE Sensitive     VANCOMYCIN  0.5 SENSITIVE Sensitive     * MODERATE STREPTOCOCCUS INTERMEDIUS  MRSA Next Gen by PCR, Nasal     Status: None   Collection Time: 05/22/23  5:35 PM   Specimen: Nasal Mucosa; Nasal Swab  Result Value Ref Range Status   MRSA by PCR Next Gen NOT DETECTED NOT DETECTED Final  Comment: (NOTE) The GeneXpert MRSA Assay (FDA approved for NASAL specimens only), is one component of a comprehensive MRSA colonization surveillance program. It is not intended to diagnose MRSA infection nor to guide or monitor treatment for MRSA infections. Test performance is not FDA approved in patients less than 65  years old. Performed at [facility], [address] [place], [place] [address]   Culture, Respiratory w Gram Stain     Status: None   Collection Time: 05/22/23  5:38 PM   Specimen: Tracheal Aspirate  Result Value Ref Range Status   Specimen Description   Final    TRACHEAL ASPIRATE Performed at [facility], [address], [place], [place] [address]    Special Requests   Final    NONE Performed at [facility], [address] [place], [place] [address]    Gram Stain   Final    ABUNDANT WBC PRESENT, PREDOMINANTLY PMN RARE GRAM POSITIVE COCCI RARE GRAM POSITIVE RODS    Culture   Final    Consistent with normal respiratory flora. NO STAPHYLOCOCCUS AUREUS ISOLATED Performed at [facility], [address] [place], [place] [address]    Report Status 05/28/2023 FINAL  Final    Scheduled Meds:  acetaminophen   650 mg Oral Q6H WA   budesonide -glycopyrrolate -formoterol   2 puff Inhalation BID   enoxaparin (LOVENOX) injection  40 mg Subcutaneous Q24H   finasteride   5 mg Oral Daily   fluticasone   1 spray Each Nare Daily   furosemide   20 mg Oral BID   insulin aspart  0-15 Units Subcutaneous TID WC   insulin aspart  0-5 Units Subcutaneous QHS   irbesartan   150 mg Oral Daily   magic mouthwash w/lidocaine   15 mL Oral QID   magnesium  hydroxide  30 mL Oral QHS   metoprolol  tartrate  25 mg Oral BID   montelukast   10 mg Oral QHS   saccharomyces boulardii  250 mg Oral BID   sodium chloride  flush  10 mL Intrapleural Q8H   tamsulosin   0.4 mg Oral BID   Continuous Infusions:  piperacillin-tazobactam (ZOSYN)  IV 3.375 g (05/28/23 0834)     LOS: 6 days   Time spent: 50 minutes  Unk Garb, DO  [facility]  05/28/2023, 2:43 PM

## 2023-05-28 NOTE — Progress Notes (Signed)
SATURATION QUALIFICATIONS: (This note is used to comply with regulatory documentation for home oxygen)  Patient Saturations on Room Air at Rest = 100 %  Patient Saturations on Room Air while Ambulating = 98%  Patient Saturations on 0 Liters of oxygen while Ambulating = 97%  Please briefly explain why patient needs home oxygen:

## 2023-05-28 NOTE — Progress Notes (Addendum)
 NAME:  Cody Tate, MRN:  [id number], DOB:  05-Apr-1950, LOS: 6 ADMISSION DATE:  05/22/2023, CONSULTATION DATE:  05/22/23 REFERRING MD:  Jannette Mend, CHIEF COMPLAINT:  SOB   History of Present Illness:  73 year old man w/ hx of smoking, COPD, prior abnormal CT in 2024 with R sided necrotic masses that resolved with abx presenting with about 3 weeks of worsening SOB, cough with intermittent hemoptysis.  Has taken course of doxy then levaquin .  Imaging initially with rounded mass-like left consolidation now with loculated effusion associated.  PCCM consulted to assist with management.   Pertinent  Medical History   Past Medical History:  Diagnosis Date   Acute heart failure with preserved ejection fraction (HCC) 11/12/2022   Arthritis    Back pain    Radiates down both legs   BPH (benign prostatic hyperplasia)    Cancer (HCC) 2022   prostate cancer   COPD (chronic obstructive pulmonary disease) (HCC)    Dyspnea    Enlarged prostate    GERD (gastroesophageal reflux disease)    Glaucoma    H/O hiatal hernia    H/O wheezing    occ inhaler usage   Heart murmur    Hypertension    Lumbar foraminal stenosis    Macular degeneration    Meningioma (HCC) 2014   Treated with radiation   Neuromuscular disorder (HCC)    Neuropathy bilateral legs   PONV (postoperative nausea and vomiting)    Pulmonary nodules    resolved on 2019 follow up CT   S/P insertion of spinal cord stimulator      Significant Hospital Events: Including procedures, antibiotic start and stop dates in addition to other pertinent events   5/21 admit, pigtail first pleural lytics administered 5/22 second dose pleural lytics administered 5/23 825 out of chest tube overnight, lytics #3 5/24 DC pigtail  Interim History / Subjective:  5/27 - feeling a lot better. DEaling with severe bilateral pedal edema that is  worse than home baseline but improved since admit. On room air.HE is puzzled by his  ilness   Objective    Blood pressure (!) 159/86, pulse 94, temperature 97.8 F (36.6 C), temperature source Oral, resp. rate 20, height 5' 6.5" (1.689 m), weight 102.5 kg, SpO2 97%.        Intake/Output Summary (Last 24 hours) at 05/28/2023 1049 Last data filed at 05/28/2023 0835 Gross per 24 hour  Intake 10 ml  Output 2100 ml  Net -2090 ml   Filed Weights   05/28/23 1010  Weight: 102.5 kg    Examination: General: No distress. Looks well. Siting on chair with walker Neuro: Alert and Oriented x 3. GCS 15. Speech normal Psych: Pleasant Resp:  Barrel Chest - mid.  Wheeze - no, Crackles - no, No overt respiratory distress CVS: Normal heart sounds. Murmurs - no Ext: Stigmata of Connective Tissue Disease - ni HEENT: Normal upper airway. PEERL +. No post nasal drip EXT: SEVER 3+ chronnic edema bilaterally   Resolved problem list  Sepsis  Acute respiratory failure with hypoxia  Assessment and Plan  Left CAP with strep intermedius empyema - Pseudomonas on respiratory culture -History of right-sided pneumonia 11/2022  5/27 - improved a lot  P: Await sensitivities  Dr Bertrum Brodie ok with total 2 week Pseudmonias Rx with d1 being inpatient  -Unclear cause of recurrent pneumonia, he denies obvious aspiration episodes or reflux   - sent message to pulm office  - cancel upcoiming CT chext next few  days > move it to 6-8weeks  - following 2 weeks and 8 week in pulm clinic  -0 walk test for o2 prior to dc  - get echo (bad edema +)  SIGNATURE    Dr. Maire Scot, M.D., F.C.C.P,  Pulmonary and Critical Care Medicine Staff Physician, [facility] Director - Interstitial Lung Disease  Program  Pulmonary Fibrosis [facility] [place], [place], [address]   Pager: [phone], If no answer  -> Check AMION or Try [phone] Telephone (clinical office): [phone] Telephone (research): [phone]  10:49  AM 05/28/2023    LABS    PULMONARY No results for input(s): "PHART", "PCO2ART", "PO2ART", "HCO3", "TCO2", "O2SAT" in the last 168 hours.  Invalid input(s): "PCO2", "PO2"  CBC Recent Labs  Lab 05/23/23 0419 05/24/23 0351 05/25/23 0428  HGB 12.6* 13.3 13.8  HCT 40.6 42.0 42.5  WBC 36.2* 27.6* 18.2*  PLT 365 355 360    COAGULATION Recent Labs  Lab 05/22/23 1005  INR 1.2    CARDIAC  No results for input(s): "TROPONINI" in the last 168 hours. No results for input(s): "PROBNP" in the last 168 hours.   CHEMISTRY Recent Labs  Lab 05/22/23 1005 05/23/23 0419 05/24/23 0351 05/25/23 0428  NA 135 127* 130* 132*  K 3.2* 4.1 3.3* 4.0  CL 99 93* 95* 97*  CO2 23 25 25 25   GLUCOSE 108* 109* 102* 97  BUN 16 22 27* 23  CREATININE 1.15 1.05 0.70 0.63  CALCIUM  8.5* 8.0* 8.1* 8.3*  MG  --   --   --  2.3   Estimated Creatinine Clearance: 92.9 mL/min (by C-G formula based on SCr of 0.63 mg/dL).   LIVER Recent Labs  Lab 05/22/23 1005 05/24/23 0351  AST 37 44*  ALT 36 33  ALKPHOS 178* 137*  BILITOT 1.6* 0.6  PROT 6.1* 4.9*  ALBUMIN 2.4* 1.9*  INR 1.2  --      INFECTIOUS Recent Labs  Lab 05/22/23 1018 05/22/23 1420 05/22/23 1533 05/22/23 1813  LATICACIDVEN 3.0*  --  3.4* 3.6*  PROCALCITON  --  8.71  --   --      ENDOCRINE CBG (last 3)  Recent Labs    05/27/23 1631 05/27/23 2026 05/28/23 0733  GLUCAP 119* 155* 109*         IMAGING x48h  - image(s) personally visualized  -   highlighted in bold DG CHEST PORT 1 VIEW Result Date: 05/26/2023 CLINICAL DATA:  Empyema. EXAM: PORTABLE CHEST 1 VIEW COMPARISON:  05/24/2023 FINDINGS: Lungs are adequately inflated with stable elevation of the left hemidiaphragm. Right lung is clear. Persistent masslike opacification over the lateral left mid to lower lung. Persistent pleural thickening with possible small persistent collection of air along the lateral mid to lower left thorax likely representing patient's  persistent small hydropneumothorax. Cardiomediastinal silhouette and remainder of the exam is unchanged. IMPRESSION: Persistent masslike opacification over the lateral left mid to lower lung with persistent pleural thickening including possible small persistent collection of air along the lateral mid to lower left thorax likely representing patient's persistent small hydropneumothorax. Electronically Signed   By: Roda Cirri M.D.   On: 05/26/2023 14:45

## 2023-05-28 NOTE — Plan of Care (Signed)
 Discharge instructions given to the patient and his wife including medications and follow ups.

## 2023-05-28 NOTE — Discharge Summary (Addendum)
 [facility] Phys[facility] Discharge Summary   [name]  Admit date:     05/22/2023  Discharge date: 05/28/2023  Attending Physician: Gaylin Ke [[id number]]  Discharge Physician: Unk Garb   PCP: Benedetta Bradley, MD  Admitted From: Home  Disposition:  Home  Recommendations for Outpatient Follow-up:  Follow up with PCP in 1-2 weeks F/u with pulmonology in 2 weeks  Home Health:No Equipment/Devices: None  Discharge Condition:Stable CODE STATUS:FULL Diet recommendation: Heart Healthy Fluid Restriction: None  Hospital Summary: HPI: Cody Tate is a 73 y.o. male with medical history significant for COPD on room air, hyperlipidemia, CAD, hypertension recent outpatient treatment for COPD exacerbation and suspected pneumonia being admitted to the hospital with acute hypoxic respiratory failure and sepsis.  He was recently treated by pulmonology as an outpatient with courses of p.o. Levaquin  and doxycycline , he seemed to improve overall and was stable on room air.  Starting about 3 days ago, he was intermittently agitated but much according to his wife, seem to be slightly confused yesterday.  Also yesterday, he started having a frothy cough and feeling short of breath.  He had a temperature of 101 yesterday.  This morning, he also seemed a little bit lethargic/confused, not really answering the questions appropriately but this was somewhat transient.  Patient and wife deny any history of nausea, vomiting, chest pain, diarrhea or other concerns.   Significant Events: Admitted 05/22/2023 for acute hypoxic respiratory failure 05-22-2023 chest tube inserted by PCCM 05-21 thru 5-23 had daily intra-pleural lytics 05-25-2023 chest tube removed.  Admission Labs: Lactic acid 3.0 Na 135, K 3.2, CO2 of 23, BUN 16, Scr 1.15, glu 108 WBC 30.4, HgB 15.9, plt 451, >20% bands BNP 165 Procal 8.7  Admission Imaging Studies: CXR Moderate left pleural effusion  with opacification of the majority of the left mid to lower lung field  Significant Labs: Pleural fluid analysis. Fluid LDH 1017, protein 3.9, gluc <20, >10K nucleated cells. 63% neutrophils Strep Pneumo antigen Negative  Significant Imaging Studies:   Antibiotic Therapy: Anti-infectives (From admission, onward)    Start     Dose/Rate Route Frequency Ordered Stop   05/23/23 1400  Vancomycin  (VANCOCIN ) 1,250 mg in sodium chloride  0.9 % 250 mL IVPB        1,250 mg 166.7 mL/hr over 90 Minutes Intravenous Every 24 hours 05/22/23 1252     05/22/23 1800  ceFEPIme (MAXIPIME) 2 g in sodium chloride  0.9 % 100 mL IVPB  Status:  Discontinued        2 g 200 mL/hr over 30 Minutes Intravenous Every 8 hours 05/22/23 1252 05/22/23 1528   05/22/23 1700  piperacillin-tazobactam (ZOSYN) IVPB 3.375 g        3.375 g 12.5 mL/hr over 240 Minutes Intravenous Every 8 hours 05/22/23 1544     05/22/23 1300  vancomycin  (VANCOCIN ) IVPB 1000 mg/200 mL premix        1,000 mg 200 mL/hr over 60 Minutes Intravenous  Once 05/22/23 1252 05/22/23 1538   05/22/23 1100  vancomycin  (VANCOCIN ) IVPB 1000 mg/200 mL premix        1,000 mg 200 mL/hr over 60 Minutes Intravenous Every 2 hours while awake 05/22/23 1028 05/22/23 1559   05/22/23 1030  piperacillin-tazobactam (ZOSYN) IVPB 3.375 g        3.375 g 100 mL/hr over 30 Minutes Intravenous  Once 05/22/23 1028 05/22/23 1116       Procedures: 05-22-2023 chest tube insertion 05-22-2023 pleural fibrinolysis #1 05-23-2023  pleural fibrinolysis #2 05-24-2023 pleural fibrinolysis #3 05-25-2023 chest tube removed.  Consultants: [facility] Course by Problem: * Sepsis with acute organ dysfunction without septic shock (HCC) On admission. meeting criteria with leukocytosis, tachycardia, fever 101 at home.  Source is suspected community-acquired pneumonia/empyema.  Chest x-ray as above with moderate left pleural effusion. -Inpatient admission to  progressive -Follow-up blood cultures -Empiric broad-spectrum IV antibiotics  05-23-2023 not hypotensive. WBC still 36K. Awaiting blood, sputum, pleural fluid cultures. On Zosyn/Vanco.  05-25-2023 WBC down to 18. BP normal. No fevers. Sepsis is resolved.  Parapneumonic effusion 05-23-2023 left pigtail chest tube inserted yesterday. On Day #2 of pleural lytics. Already has had out at least 2 liters from chest tube.  05-24-2023 awaiting PCCM management of chest tube/pleural lytics.  05-25-2023 has had total of 3 doses of intra-pleural lytics. Awaiting PCCM to pulled chest tube. CT chest shows cavitary pneumonia.  05-26-2023 resolved. Chest tube pulled yesterday. PCCM thinks pt's CAP caused by pseudomonas. Awaiting final sensitivities for pseudomonas growing from his respiratory cultures. He remains on IV zosyn.  05-27-2023 resolved.  CAP (community acquired pneumonia) 05-23-2023 not hypotensive. WBC still 36K. Awaiting blood, sputum, pleural fluid cultures. On Zosyn/Vanco.   05-24-2023 WBC 27K. Remains on Zosyn/vanco. Blood, sputum, pleural fluid cultures negative thus far.  05-25-2023 WBC down to 18. On Zosyn only. Vanco stopped yesterday. Trach aspirate only growing rare pseudomonas. Pleural fluid and blood cx are negative.  05-26-2023 PCCM thinks pt's CAP caused by pseudomonas. Awaiting final sensitivities for pseudomonas growing from his respiratory cultures. He remains on IV zosyn.  05-27-2023 awaiting final MIC on pseudomonas. PCCM wants him treated with additional 3 week at time of discharge if pseudomonas susceptible to quinolones.  05-28-2023 after further discussion with pharmacy and lab, pt's rare pseudomonas on his respiratory culture should have NOT been flagged as pathologic. Discussed with Dr. Bertrum Brodie. Ok to discharge him with po augmentin for 8 days(to complete total of 14 days of treatment). He will f/u with pulmonology in clinic in 2 weeks. Dr. Bertrum Brodie did not think  bronchoscopy was indicated during current hospital stay.  Home O2 evaluation performed prior to discharge. He does not need home O2.   Opioid-induced constipation 05-26-2023 po dulcolax did not work. Will give movantik, linzess and other laxatives to stimulate BM.  05-27-2023 BM x 2 yesterday. Resolved.  Acute hyponatremia 05-23-2023 Na 127 today. Was 135 yesterday. He received IVF with LR yesterday. He did not get any diuretics.  Will repeat BMP tomorrow. At this point, I think this is just a spurious lab value.  05-24-2023 Na 130 today. Was 127 yesterday. Continue to monitor.  05-25-2023 Na up to 132. Resolved.  Acute respiratory failure with hypoxia (HCC)-resolved as of 05/25/2023 05-23-2023 due to pneumonia and effusion. He is not on home O2. Continue with supplemental O2.  05-24-2023 resolved. Pt has been weaned to RA.  Swelling of lower limb 05-23-2023 chronic per pt and wife. Although wife states right leg is usually not this swollen.  05-24-2023 chronic.  05-25-2023 stable. Chronic.  05-26-2023 stable. Restarted lasix  20 mg yesterday  05-27-2023 increase lasix  to 20 mg bid.  05-28-2023 DC to home with lasix  20 mg bid x 7 days then decrease to 20 mg daily.  Opioid dependence (HCC) 05-23-2023 has valid Rx for oxycodone  10 mg q4h prn at home.  05-24-2023 continue oxycodone  prn.  05-25-2023 stable  05-26-2023 stable  05-27-2023 stable.  Obesity, Class II, BMI 35-39.9 Estimated body mass index is 36.38  kg/m as calculated from the following:   Height as of 05/16/23: 5' 6.5" (1.689 m).   Weight as of 05/16/23: 103.8 kg.    Essential hypertension 05-23-2023 holding HTN meds due to sepsis.  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  BP controlled with much lower doses of lopressor (25 mg bid) than his usual home dose of Toprol -XL 100 mg daily. Continue to hold ARB and diuretic for  now.  05-25-2023 BP on the rise today.  After chest tube pulled, may need to increase his HTN meds. Increased BP may be due to pain from chest tube.  05-26-2023 restarting his ARB.  05-27-2023 BP improved with restarting ARB. Remains on lopressor  25 mg bid. Can restart Toprol -XL 100 mg day at discharge.  Hypokalemia 05-23-2023 repleted. K 4.1 today. Resolved.  05-24-2023 K 3.3 today. Give more PO Kcl.  05-25-2023 resolved.    Discharge Diagnoses:  Principal Problem:   Sepsis with acute organ dysfunction without septic shock (HCC) Active Problems:   CAP (community acquired pneumonia)   Parapneumonic effusion   Acute hyponatremia   Opioid-induced constipation   Essential hypertension   Obesity, Class II, BMI 35-39.9   Opioid dependence (HCC)   Swelling of lower limb   Hypokalemia   [facility])   Discharge Instructions  Discharge Instructions     Ambulatory referral to Infectious Disease   Complete by: As directed    Consult for recurrent pneumonia. Now with cavitary pneumonia, empyema s/p chest tube with intra-pleural lytics   Call MD for:  difficulty breathing, headache or visual disturbances   Complete by: As directed    Call MD for:  extreme fatigue   Complete by: As directed    Call MD for:  hives   Complete by: As directed    Call MD for:  persistant dizziness or light-headedness   Complete by: As directed    Call MD for:  persistant nausea and vomiting   Complete by: As directed    Call MD for:  redness, tenderness, or signs of infection (pain, swelling, redness, odor or green/yellow discharge around incision site)   Complete by: As directed    Call MD for:  severe uncontrolled pain   Complete by: As directed    Call MD for:  temperature >100.4   Complete by: As directed    Diet - low sodium heart healthy   Complete by: As directed    Discharge instructions   Complete by: As directed    1. Follow up with your primary care provider in 1-2 weeks  following discharge from hospital. 2. Follow up with pulmonology in 2 weeks. Office will call you for appointment.   Increase activity slowly   Complete by: As directed       Allergies as of 05/28/2023       Reactions   Amlodipine Besylate    Other Reaction(s): fatigue   Aspirin     Other Reaction(s): GI upset   Other Itching, Other (See Comments), Rash, Dermatitis   VICRYL Sutures   Tape Itching, Other (See Comments), Rash   Occlusive tape and bandaids   Wound Dressing Adhesive Rash        Medication List     STOP taking these medications    levofloxacin  500 MG tablet Commonly known as: LEVAQUIN        TAKE these medications    Afrin 12 Hour 0.05 % nasal spray Generic drug: oxymetazoline  Place 1 spray into  both nostrils 2 (two) times daily as needed for congestion.   amoxicillin-clavulanate 875-125 MG tablet Commonly known as: AUGMENTIN Take 1 tablet by mouth 2 (two) times daily for 8 days.   aspirin  EC 81 MG tablet Take 1 tablet (81 mg total) by mouth daily. Swallow whole.   Benadryl  Allergy  25 MG tablet Generic drug: diphenhydrAMINE  Take 25 mg by mouth at bedtime.   Breztri  Aerosphere 160-9-4.8 MCG/ACT Aero inhaler Generic drug: budesonide -glycopyrrolate -formoterol  Inhale 2 puffs into the lungs in the morning and at bedtime.   celecoxib 200 MG capsule Commonly known as: CELEBREX Take 1 capsule (200 mg total) by mouth 2 (two) times daily.   chlorhexidine  4 % external liquid Commonly known as: HIBICLENS  Apply 1 Application topically daily as needed (boils).   Combivent  Respimat 20-100 MCG/ACT Aers respimat Generic drug: Ipratropium-Albuterol  Inhale 1 puff into the lungs every 6 (six) hours as needed for wheezing or shortness of breath.   empagliflozin  10 MG Tabs tablet Commonly known as: Jardiance  Take 1 tablet (10 mg total) by mouth daily. What changed: See the new instructions.   finasteride  5 MG tablet Commonly known as: PROSCAR  Take 5 mg by  mouth daily.   fluticasone  50 MCG/ACT nasal spray Commonly known as: FLONASE  Place 1 spray into both nostrils 2 (two) times daily.   furosemide  20 MG tablet Commonly known as: LASIX  Take 1 tablet (20 mg total) by mouth 2 (two) times daily for 7 days, THEN 1 tablet (20 mg total) daily for 21 days. Start taking on: May 28, 2023 What changed: See the new instructions.   gabapentin  300 MG capsule Commonly known as: NEURONTIN  Take 1 capsule (300 mg total) by mouth 4 (four) times daily.   Lutein-Zeaxanthin 25-5 MG Caps Take 1 Dose by mouth daily.   magic mouthwash w/lidocaine  Soln Take 15 mLs by mouth 4 (four) times daily as needed for mouth pain. contains equal amounts of Maalox Extra Strength, nystatin, diphenhydramine  and lidocaine .   magnesium  hydroxide 400 MG/5ML suspension Commonly known as: MILK OF MAGNESIA Take 15 mLs by mouth at bedtime.   metoprolol  succinate 100 MG 24 hr tablet Commonly known as: TOPROL -XL Take 1 tablet (100 mg total) by mouth at bedtime. Take with or immediately following a meal.   montelukast  10 MG tablet Commonly known as: SINGULAIR  TAKE 1 TABLET BY MOUTH AT  BEDTIME   Mucinex  Maximum Strength 1200 MG Tb12 Generic drug: Guaifenesin  Take 1,200 mg by mouth 2 (two) times daily.   multivitamin with minerals Tabs tablet Take 1 tablet by mouth daily.   omeprazole  40 MG capsule Commonly known as: PRILOSEC Take 1 capsule (40 mg total) by mouth daily before breakfast.   Oxycodone  HCl 10 MG Tabs Take 10 mg by mouth every 4 (four) hours.   rosuvastatin  20 MG tablet Commonly known as: CRESTOR  Take 1 tablet (20 mg total) by mouth at bedtime.   Simethicone  125 MG Caps Take 250 mg by mouth 4 (four) times daily as needed (for gas).   sodium chloride  0.65 % Soln nasal spray Commonly known as: OCEAN Place 1 spray into both nostrils 2 (two) times daily.   Systane Complete PF 0.6 % Soln Generic drug: Propylene Glycol (PF) Place 1 drop into both eyes 3  (three) times daily as needed (dry eyes).   tamsulosin  0.4 MG Caps capsule Commonly known as: FLOMAX  Take 0.4 mg by mouth 2 (two) times daily.   telmisartan 80 MG tablet Commonly known as: MICARDIS Take 0.5 tablets (40 mg total)  by mouth 2 (two) times daily.   timolol 0.5 % ophthalmic solution Commonly known as: TIMOPTIC Place 1 drop into both eyes at bedtime.   vitamin C  1000 MG tablet Take 1,000 mg by mouth daily.   Vitamin D3 50 MCG (2000 UT) capsule Take 4,000 Units by mouth daily.        Allergies  Allergen Reactions   Amlodipine Besylate     Other Reaction(s): fatigue   Aspirin      Other Reaction(s): GI upset   Other Itching, Other (See Comments), Rash and Dermatitis    VICRYL Sutures   Tape Itching, Other (See Comments) and Rash    Occlusive tape and bandaids   Wound Dressing Adhesive Rash    Discharge Exam: Vitals:   05/27/23 2131 05/28/23 0529  BP: (!) 178/66 (!) 159/86  Pulse: 93 94  Resp:  20  Temp:  97.8 F (36.6 C)  SpO2:  97%    Physical Exam Vitals and nursing note reviewed.  Constitutional:      Appearance: He is obese.  HENT:     Head: Normocephalic and atraumatic.  Eyes:     General: No scleral icterus. Pulmonary:     Effort: Pulmonary effort is normal. No respiratory distress.  Abdominal:     General: There is no distension.  Musculoskeletal:     Right lower leg: Edema present.     Left lower leg: Edema present.     Comments: Stable +2 bilateral pitting edema. Wife states this is better since admission.  Skin:    Capillary Refill: Capillary refill takes less than 2 seconds.  Neurological:     Mental Status: He is alert and oriented to person, place, and time.     The results of significant diagnostics from this hospitalization (including imaging, microbiology, ancillary and laboratory) are listed below for reference.    Microbiology: Recent Results (from the past 240 hours)  Culture, blood (Routine x 2)     Status: None    Collection Time: 05/22/23 10:05 AM   Specimen: BLOOD LEFT ARM  Result Value Ref Range Status   Specimen Description   Final    BLOOD LEFT ARM Performed at [facility], [address] [place], [place] [address]    Special Requests   Final    BOTTLES DRAWN AEROBIC AND ANAEROBIC Blood Culture results may not be optimal due to an inadequate volume of blood received in culture bottles Performed at [facility], [address] [place], [place] [address]    Culture   Final    NO GROWTH 5 DAYS Performed at [facility], [address], [place], [place] [address]    Report Status 05/27/2023 FINAL  Final  Culture, blood (Routine x 2)     Status: None   Collection Time: 05/22/23 10:09 AM   Specimen: BLOOD  Result Value Ref Range Status   Specimen Description   Final    BLOOD RIGHT ANTECUBITAL Performed at [facility], [address] [place], [place] [address]    Special Requests   Final    BOTTLES DRAWN AEROBIC AND ANAEROBIC Blood Culture results may not be optimal due to an inadequate volume of blood received in culture bottles Performed at [facility], [address], [place], [place] [address]    Culture   Final    NO GROWTH 5 DAYS Performed at [facility], [address] [place], [place] [address]  Report Status 05/27/2023 FINAL  Final  Body fluid culture w Gram Stain     Status: None   Collection Time: 05/22/23  2:49 PM   Specimen: Pleural Fluid  Result Value Ref Range Status   Specimen Description   Final    PLEURAL Performed at [facility], [address], [place], [place] [address]    Special Requests   Final    NONE Performed at [facility], [address] [place], [place] [address]    Gram Stain   Final    WBC PRESENT, PREDOMINANTLY PMN NO ORGANISMS SEEN CYTOSPIN SMEAR    Culture   Final    MODERATE STREPTOCOCCUS INTERMEDIUS RESULT CALLED TO, READ BACK BY  AND VERIFIED WITH: RN ALEXIS PHILLIPPE [phone] BY Chestine Costain, MT Performed at [facility], [address] [place], [place] [address]    Report Status 05/26/2023 FINAL  Final   Organism ID, Bacteria STREPTOCOCCUS INTERMEDIUS  Final      Susceptibility   Streptococcus intermedius - MIC*    PENICILLIN <=0.06 SENSITIVE Sensitive     CEFTRIAXONE  <=0.12 SENSITIVE Sensitive     ERYTHROMYCIN >=8 RESISTANT Resistant     LEVOFLOXACIN  <=0.25 SENSITIVE Sensitive     VANCOMYCIN  0.5 SENSITIVE Sensitive     * MODERATE STREPTOCOCCUS INTERMEDIUS  MRSA Next Gen by PCR, Nasal     Status: None   Collection Time: 05/22/23  5:35 PM   Specimen: Nasal Mucosa; Nasal Swab  Result Value Ref Range Status   MRSA by PCR Next Gen NOT DETECTED NOT DETECTED Final    Comment: (NOTE) The GeneXpert MRSA Assay (FDA approved for NASAL specimens only), is one component of a comprehensive MRSA colonization surveillance program. It is not intended to diagnose MRSA infection nor to guide or monitor treatment for MRSA infections. Test performance is not FDA approved in patients less than 24 years old. Performed at [facility], [address], [place], [place] [address]   Culture, Respiratory w Gram Stain     Status: None   Collection Time: 05/22/23  5:38 PM   Specimen: Tracheal Aspirate  Result Value Ref Range Status   Specimen Description   Final    TRACHEAL ASPIRATE Performed at [facility], [address], [place], [place] [address]    Special Requests   Final    NONE Performed at [facility], [address] [place], [place] [address]    Gram Stain   Final    ABUNDANT WBC PRESENT, PREDOMINANTLY PMN RARE GRAM POSITIVE COCCI RARE GRAM POSITIVE RODS    Culture   Final    Consistent with normal respiratory flora. NO STAPHYLOCOCCUS AUREUS ISOLATED Performed at [facility], [address], [place], [place] [address]    Report Status 05/28/2023  FINAL  Final     Labs: BNP (last 3 results) Recent Labs    05/22/23 1005  BNP 165.8*   Basic Metabolic Panel: Recent Labs  Lab 05/22/23 1005 05/23/23 0419 05/24/23 0351 05/25/23 0428  NA 135 127* 130* 132*  K 3.2* 4.1 3.3* 4.0  CL 99 93* 95* 97*  CO2 23 25 25 25   GLUCOSE 108* 109* 102* 97  BUN 16 22 27* 23  CREATININE 1.15 1.05 0.70 0.63  CALCIUM  8.5* 8.0* 8.1* 8.3*  MG  --   --   --  2.3   Liver Function Tests: Recent Labs  Lab 05/22/23 1005 05/24/23 0351  AST 37 44*  ALT 36 33  ALKPHOS 178* 137*  BILITOT 1.6* 0.6  PROT 6.1* 4.9*  ALBUMIN 2.4* 1.9*   CBC: Recent Labs  Lab 05/22/23 1005 05/23/23 0419 05/24/23 0351 05/25/23 0428  WBC 30.4* 36.2* 27.6* 18.2*  NEUTROABS 26.2*  --  23.4* 15.8*  HGB 15.9 12.6* 13.3 13.8  HCT 48.0 40.6 42.0 42.5  MCV 90.2 94.6 92.1 89.9  PLT 451* 365 355 360   BNP: Recent Labs  Lab 05/22/23 1005  BNP 165.8*   CBG: Recent Labs  Lab 05/27/23 1138 05/27/23 1631 05/27/23 2026 05/28/23 0733 05/28/23 1124  GLUCAP 143* 119* 155* 109* 152*   Urinalysis    Component Value Date/Time   COLORURINE AMBER (A) 05/22/2023 2017   APPEARANCEUR CLOUDY (A) 05/22/2023 2017   LABSPEC 1.027 05/22/2023 2017   PHURINE 5.0 05/22/2023 2017   GLUCOSEU >=500 (A) 05/22/2023 2017   HGBUR NEGATIVE 05/22/2023 2017   BILIRUBINUR NEGATIVE 05/22/2023 2017   KETONESUR NEGATIVE 05/22/2023 2017   PROTEINUR 100 (A) 05/22/2023 2017   NITRITE NEGATIVE 05/22/2023 2017   LEUKOCYTESUR NEGATIVE 05/22/2023 2017   Sepsis Labs Recent Labs  Lab 05/22/23 1005 05/22/23 1420 05/23/23 0419 05/24/23 0351 05/25/23 0428  PROCALCITON  --  8.71  --   --   --   WBC 30.4*  --  36.2* 27.6* 18.2*    Procedures/Studies: DG CHEST PORT 1 VIEW Result Date: 05/26/2023 CLINICAL DATA:  Empyema. EXAM: PORTABLE CHEST 1 VIEW COMPARISON:  05/24/2023 FINDINGS: Lungs are adequately inflated with stable elevation of the left hemidiaphragm. Right lung is clear.  Persistent masslike opacification over the lateral left mid to lower lung. Persistent pleural thickening with possible small persistent collection of air along the lateral mid to lower left thorax likely representing patient's persistent small hydropneumothorax. Cardiomediastinal silhouette and remainder of the exam is unchanged. IMPRESSION: Persistent masslike opacification over the lateral left mid to lower lung with persistent pleural thickening including possible small persistent collection of air along the lateral mid to lower left thorax likely representing patient's persistent small hydropneumothorax. Electronically Signed   By: Roda Cirri M.D.   On: 05/26/2023 14:45   CT CHEST WO CONTRAST Result Date: 05/24/2023 CLINICAL DATA:  Pneumonia, complication suspected, xray done, left hydropneumothorax, abnormal x-ray EXAM: CT CHEST WITHOUT CONTRAST TECHNIQUE: Multidetector CT imaging of the chest was performed following the standard protocol without IV contrast. RADIATION DOSE REDUCTION: This exam was performed according to the departmental dose-optimization program which includes automated exposure control, adjustment of the mA and/or kV according to patient size and/or use of iterative reconstruction technique. COMPARISON:  05/24/2023, 11/28/2022 FINDINGS: Cardiovascular: Unenhanced imaging of the heart is unremarkable without pericardial effusion. Normal caliber of the thoracic aorta. Atherosclerosis of the aorta and coronary vasculature. Assessment of the vascular lumen cannot be performed without intravenous contrast. Mediastinum/Nodes: No enlarged mediastinal or axillary lymph nodes. Thyroid  gland, trachea, and esophagus demonstrate no significant findings. Lungs/Pleura: Left-sided pigtail pleural drainage catheter is seen coiled along the medial costophrenic angle. Mild diffuse left pleural thickening, with trace left hydropneumothorax identified. Gas component far less than 5%. Fluid component far  less than 100 cc. Large area of masslike left upper lobe consolidation is noted, measuring 7.8 by 6.8 x 7.8 cm, corresponding to the chest x-ray finding. Punctate foci of gas within the consolidated lung consistent with central cavitation. There is mass effect upon the major fissure. Overall, findings favor cavitating pneumonia over malignancy given interval development since the 11/28/2022 CT. Interval resolution of  the cavitating right upper lobe consolidation seen previously, with postinflammatory scarring noted. No right-sided airspace disease, effusion, or pneumothorax. Upper Abdomen: No acute abnormality. Musculoskeletal: Postsurgical changes within the cervical and thoracic spine. Spinal stimulator in place. No acute displaced fractures. IMPRESSION: 1. Masslike consolidation left upper lobe with areas of central gas lucency, favor cavitating pneumonia over malignancy given rapid development since 11/28/2022 exam. 2. Trace left hydropneumothorax as above, with mild diffuse left pleural thickening. Indwelling pigtail pleural drainage catheter within the left medial costophrenic angle. 3. Interval resolution of the cavitating consolidation within the right upper lobe on prior study, with resulting postinflammatory scarring noted. 4. Aortic Atherosclerosis (ICD10-I70.0). Coronary artery atherosclerosis. Electronically Signed   By: Bobbye Burrow M.D.   On: 05/24/2023 15:57   DG CHEST PORT 1 VIEW Result Date: 05/24/2023 CLINICAL DATA:  Follow-up left pneumothorax and chest tube. EXAM: PORTABLE CHEST 1 VIEW COMPARISON:  05/23/2023 FINDINGS: The previously demonstrated small left lateral hydropneumothorax is slightly larger, measuring 1.3 cm in thickness today, previously 0.8 cm. There is a better defined adjacent rounded mass-like density. Decreased patchy density in the left lower lung zone. Stable left lower lung zone pigtail pleural catheter. Clear right lung. Normal-sized heart. Neural stimulator leads  overlying the midthoracic spine. Cervical and lumbar spine fixation hardware. Lower thoracic spine degenerative changes. IMPRESSION: 1. Slightly larger small left lateral hydropneumothorax, currently occupying approximately 5% of the left hemithorax. 2. Better defined adjacent rounded mass-like density. 3. Decreased patchy density in the left lower lung zone, likely due to resolving atelectasis. Electronically Signed   By: Catherin Closs M.D.   On: 05/24/2023 11:50   DG Chest 1 View Result Date: 05/23/2023 CLINICAL DATA:  Chest tube in place. EXAM: CHEST  1 VIEW COMPARISON:  Chest radiograph dated 05/23/2023. FINDINGS: Similar positioning of the left chest tube. Stable left lung base opacity. Small left pneumothorax similar to prior radiograph. Stable cardiac silhouette. No acute osseous pathology. IMPRESSION: No interval change. Electronically Signed   By: Angus Bark M.D.   On: 05/23/2023 12:13   DG Chest Port 1 View Result Date: 05/23/2023 CLINICAL DATA:  Chest tube in place. EXAM: PORTABLE CHEST 1 VIEW COMPARISON:  May 22, 2023. FINDINGS: The heart size and mediastinal contours are within normal limits. Right lung is clear. Stable left-sided pleural drainage catheter. Decreased loculated left pleural effusion is noted with small pneumothorax component. Left basilar atelectasis is noted. The visualized skeletal structures are unremarkable. IMPRESSION: Stable left-sided pleural drainage catheter. Loculated left hydropneumothorax is noted which is slightly decreased compared to prior exam. Electronically Signed   By: Rosalene Colon M.D.   On: 05/23/2023 07:53   DG CHEST PORT 1 VIEW Result Date: 05/22/2023 CLINICAL DATA:  Chest tube in place EXAM: PORTABLE CHEST 1 VIEW COMPARISON:  Chest x-ray 05/22/2023 FINDINGS: There is a moderate size left pleural effusion, unchanged. Questionable tiny left pneumothorax component seen near the apex. Focal opacity in the left mid and lower lung appears unchanged.  There is a new left-sided chest tube in the lower left hemithorax. The right lung is clear. The cardiomediastinal silhouette is stable. Osseous structures are stable. IMPRESSION: 1. New left-sided chest tube in place. 2. Moderate size left pleural effusion, unchanged. Questionable tiny left pneumothorax component seen near the apex which may be related to chest tube placement. 3. Focal opacity in the left mid and lower lung appears unchanged. Electronically Signed   By: Tyron Gallon M.D.   On: 05/22/2023 16:54   DG Chest [facility]  1 View Result Date: 05/22/2023 CLINICAL DATA:  Sepsis. EXAM: PORTABLE CHEST 1 VIEW COMPARISON:  Chest radiograph dated 05/16/2023. FINDINGS: Interval development of a moderate left pleural effusion with opacification of the majority of the left mid to lower lung field. The right lung is clear. No pneumothorax. The cardiac silhouette. No acute osseous pathology. IMPRESSION: Moderate left pleural effusion with opacification of the majority of the left mid to lower lung field. Electronically Signed   By: Angus Bark M.D.   On: 05/22/2023 12:28   DG Chest 2 View Result Date: 05/16/2023 CLINICAL DATA:  Pneumonia. Hemoptysis. Lung mass. History of prostate cancer EXAM: CHEST - 2 VIEW COMPARISON:  Chest x-ray 05/02/2023 FINDINGS: Hyperinflation. Slight increasing small left pleural effusion or thickening. Rounded masslike area in the left midlung is again identified. Dimensions approach 6 cm on this x-ray. Recommend further workup such as CT scan right lung is grossly clear. No right-sided consolidation. No edema or pneumothorax. Normal cardiopericardial silhouette calcified aorta. Fixation hardware along the cervical spine and lumbar spine at the edge of the imaging field. Neurostimulator leads overlying the central canal of the thoracic spine. Degenerative changes are noted. IMPRESSION: Persistent masslike opacity in the left midlung, upper lobe. Slight increase in small left pleural  effusion. Recommend further workup as this has a differential including a malignant lesion such as with CT when appropriate. Hyperinflation. Electronically Signed   By: Adrianna Horde M.D.   On: 05/16/2023 15:26   DG Chest 2 View Result Date: 05/02/2023 CLINICAL DATA:  Hemoptysis EXAM: CHEST - 2 VIEW COMPARISON:  CT chest 11/28/2022 FINDINGS: Normal heart size and pulmonary vascularity. There is a new focal area of rounded consolidation or mass in the left mid lung, not present on prior study. This could represent a focal lesion, area of consolidation, or hematoma. CT correlation is suggested. The right lung appears clear today. Previous right lung lesion seen at prior CT is not identified radiographically today. No pleural effusion or pneumothorax. Mediastinal contours appear intact. Postoperative changes in the cervical and lumbar spine. Degenerative changes in the thoracic spine and shoulders. Spinal stimulator lead tips projecting over the midthoracic region. Calcification of the aorta. IMPRESSION: 1. New rounded mass or consolidation in the left mid lung. CT recommended for further evaluation. 2. Previous right lung lesion is not identified radiographically today. See prior CT report. Electronically Signed   By: Boyce Byes M.D.   On: 05/02/2023 16:48    Time coordinating discharge: 55 mins  SIGNED:  Unk Garb, DO [facility] 05/28/23, 2:46 PM

## 2023-05-28 NOTE — Progress Notes (Signed)
 Occupational Therapy Treatment Patient Details Name: Cody Tate MRN: [id number] DOB: [birth year] Today's Date: 05/28/2023   History of present illness 73 y.o. male being admitted to the hospital with acute hypoxic respiratory failure and sepsis. Past medical history significant for COPD on room air, hyperlipidemia, CAD, hypertension recent outpatient treatment for COPD exacerbation and suspected pneumonia   OT comments  Patient seen for skilled OT this afternoon. Patient making significant gains from IE with increased overall activity tolerance, balance and strength for BADL's and mobility. See below for current status. Ambulated 100 ft with Rw in hallway with SpO2 100% on RA, amb 50 ft 98%, amb 50 more ft with breif standing rest 98% and 97% return to chair. Wife and patient confirm all needed DME as well as reacher is in home. Educated on pursed lip breathing  and provided in writing on white board for carryover. OT agreeable to alter disposition recommendations for [facility] services with family assistance and support. Will continue to follow while on Acute care unit for ongoing progression of function.       If plan is discharge home, recommend the following:  A little help with walking and/or transfers;A little help with bathing/dressing/bathroom;Assistance with cooking/housework;Assist for transportation;Help with stairs or ramp for entrance   Equipment Recommendations  None recommended by OT       Precautions / Restrictions Precautions Precautions: Fall Recall of Precautions/Restrictions: Intact Restrictions Weight Bearing Restrictions Per Provider Order: No       Mobility Bed Mobility               General bed mobility comments: OOB in recliner and remained    Transfers Overall transfer level: Needs assistance Equipment used: Rolling walker (2 wheels) Transfers: Sit to/from Stand, Bed to chair/wheelchair/BSC Sit to Stand: Supervision     Step pivot transfers:  Supervision     General transfer comment: Amb 100 ft with RW with Supervision and min cues to stay withing confines of RW frame     Balance Overall balance assessment: Needs assistance, History of Falls Sitting-balance support: Feet supported Sitting balance-Leahy Scale: Good     Standing balance support: During functional activity, Bilateral upper extremity supported Standing balance-Leahy Scale: Fair                             ADL either performed or assessed with clinical judgement   ADL Overall ADL's : Needs assistance/impaired Eating/Feeding: Independent;Sitting   Grooming: Wash/dry hands;Wash/dry face;Oral care;Independent   Upper Body Bathing: Supervision/ safety;Sitting   Lower Body Bathing: Moderate assistance;Sit to/from stand   Upper Body Dressing : Sitting;Set up   Lower Body Dressing: Contact guard assist;Minimal assistance;Sitting/lateral leans   Toilet Transfer: Supervision/safety   Toileting- Clothing Manipulation and Hygiene: Minimal assistance Toileting - Clothing Manipulation Details (indicate cue type and reason): and using urinal with set up     Functional mobility during ADLs: Supervision/safety;Rolling walker (2 wheels) General ADL Comments: educated on breathing integration, basic LB functional reach for LB and urinal use in standing    Extremity/Trunk Assessment Upper Extremity Assessment Upper Extremity Assessment: Right hand dominant;Generalized weakness   Lower Extremity Assessment Lower Extremity Assessment: Generalized weakness (improved B LE edema with elevation and ankle pumps)                 Communication Communication Communication: Impaired Factors Affecting Communication: Hearing impaired   Cognition Arousal: Alert Behavior During Therapy: WFL for tasks assessed/performed Cognition: No apparent  impairments             OT - Cognition Comments: reminder written for simple pursed lip breathing sequencing                  Following commands: Intact        Cueing   Cueing Techniques: Verbal cues        General Comments B +2 LE edema, SpO2 100% on RA, amb 50 ft 98%, amb 50 more ft with breif standing rest 98% and 97% return to chair    Pertinent Vitals/ Pain       Pain Assessment Pain Assessment: No/denies pain   Frequency  Min 2X/week        Progress Toward Goals  OT Goals(current goals can now be found in the care plan section)  Progress towards OT goals: Progressing toward goals  Acute Rehab OT Goals OT Goal Formulation: With patient/family Time For Goal Achievement: 06/06/23 Potential to Achieve Goals: Good ADL Goals Pt Will Perform Grooming: sitting;with modified independence Pt Will Perform Upper Body Dressing: sitting;with min assist Pt Will Perform Lower Body Dressing: with min assist;sitting/lateral leans;sit to/from stand Pt Will Transfer to Toilet: bedside commode;with contact guard assist;ambulating;grab bars Pt Will Perform Toileting - Clothing Manipulation and hygiene: sitting/lateral leans;sit to/from stand;with min assist  Plan         AM-PAC OT "6 Clicks" Daily Activity     Outcome Measure   Help from another person eating meals?: None Help from another person taking care of personal grooming?: None Help from another person toileting, which includes using toliet, bedpan, or urinal?: A Lot Help from another person bathing (including washing, rinsing, drying)?: A Little Help from another person to put on and taking off regular upper body clothing?: A Little Help from another person to put on and taking off regular lower body clothing?: A Little 6 Click Score: 19    End of Session Equipment Utilized During Treatment: Gait belt;Rolling walker (2 wheels);Oxygen  OT Visit Diagnosis: Repeated falls (R29.6);Muscle weakness (generalized) (M62.81);Unsteadiness on feet (R26.81);Pain   Activity Tolerance Patient tolerated treatment well   Patient Left  in chair;with call bell/phone within reach;with chair alarm set;with family/visitor present   Nurse Communication Mobility status        Time: 1352-1411 OT Time Calculation (min): 19 min  Charges: OT General Charges $OT Visit: 1 Visit OT Treatments $Therapeutic Activity: 8-22 mins  Tiaira Arambula OT/L Acute Rehabilitation Department  [phone]  05/28/2023, 2:46 PM

## 2023-05-29 NOTE — Telephone Encounter (Signed)
 Ok for July 3

## 2023-05-30 ENCOUNTER — Ambulatory Visit (HOSPITAL_BASED_OUTPATIENT_CLINIC_OR_DEPARTMENT_OTHER)

## 2023-05-30 ENCOUNTER — Telehealth: Payer: Self-pay | Admitting: Internal Medicine

## 2023-05-30 NOTE — Telephone Encounter (Signed)
 Ok for APP next week Ok for me in August 2025 but I think he needs a CT before he sees me

## 2023-05-30 NOTE — Telephone Encounter (Signed)
 That was arranged by the hospitalist. He can take this question to APP at time of followup . If he is not inerested he can also cancel. I cannot give this advice esp fres from hospital dc

## 2023-05-30 NOTE — Telephone Encounter (Signed)
 That spot is gone but there is an opening with one of the APPs next week. For you, you do not have anything in July. Would August 21st be okay?

## 2023-05-30 NOTE — Telephone Encounter (Signed)
 MR- are you ok holding off on home health eval?

## 2023-05-30 NOTE — Telephone Encounter (Signed)
 Called patient to schedule an appointment. Patient is wondering if he still needs home healthcare. He states he is feeling better and his wife can help him as well. Please call patient with an update.

## 2023-05-31 DIAGNOSIS — H353114 Nonexudative age-related macular degeneration, right eye, advanced atrophic with subfoveal involvement: Secondary | ICD-10-CM | POA: Diagnosis not present

## 2023-05-31 DIAGNOSIS — H353123 Nonexudative age-related macular degeneration, left eye, advanced atrophic without subfoveal involvement: Secondary | ICD-10-CM | POA: Diagnosis not present

## 2023-05-31 NOTE — Telephone Encounter (Signed)
 I called and spoke with the pt's spouse and notified of response from Dr Bertrum Brodie  She verbalized understanding  Nothing further needed

## 2023-06-05 ENCOUNTER — Ambulatory Visit (INDEPENDENT_AMBULATORY_CARE_PROVIDER_SITE_OTHER)

## 2023-06-05 ENCOUNTER — Ambulatory Visit: Payer: Self-pay | Admitting: Primary Care

## 2023-06-05 ENCOUNTER — Telehealth: Payer: Self-pay | Admitting: Primary Care

## 2023-06-05 ENCOUNTER — Ambulatory Visit (INDEPENDENT_AMBULATORY_CARE_PROVIDER_SITE_OTHER): Admitting: Primary Care

## 2023-06-05 VITALS — BP 150/68 | HR 65 | Ht 66.0 in | Wt 227.5 lb

## 2023-06-05 DIAGNOSIS — J189 Pneumonia, unspecified organism: Secondary | ICD-10-CM

## 2023-06-05 DIAGNOSIS — Z87891 Personal history of nicotine dependence: Secondary | ICD-10-CM | POA: Diagnosis not present

## 2023-06-05 DIAGNOSIS — R918 Other nonspecific abnormal finding of lung field: Secondary | ICD-10-CM | POA: Diagnosis not present

## 2023-06-05 DIAGNOSIS — J984 Other disorders of lung: Secondary | ICD-10-CM | POA: Diagnosis not present

## 2023-06-05 LAB — CBC WITH DIFFERENTIAL/PLATELET
Basophils Absolute: 0.1 10*3/uL (ref 0.0–0.1)
Basophils Relative: 1 % (ref 0.0–3.0)
Eosinophils Absolute: 0.2 10*3/uL (ref 0.0–0.7)
Eosinophils Relative: 1.7 % (ref 0.0–5.0)
HCT: 40.5 % (ref 39.0–52.0)
Hemoglobin: 13.5 g/dL (ref 13.0–17.0)
Lymphocytes Relative: 6.2 % — ABNORMAL LOW (ref 12.0–46.0)
Lymphs Abs: 0.7 10*3/uL (ref 0.7–4.0)
MCHC: 33.3 g/dL (ref 30.0–36.0)
MCV: 88.3 fl (ref 78.0–100.0)
Monocytes Absolute: 1 10*3/uL (ref 0.1–1.0)
Monocytes Relative: 9 % (ref 3.0–12.0)
Neutro Abs: 9.2 10*3/uL — ABNORMAL HIGH (ref 1.4–7.7)
Neutrophils Relative %: 82.1 % — ABNORMAL HIGH (ref 43.0–77.0)
Platelets: 532 10*3/uL — ABNORMAL HIGH (ref 150.0–400.0)
RBC: 4.59 Mil/uL (ref 4.22–5.81)
RDW: 14.1 % (ref 11.5–15.5)
WBC: 11.2 10*3/uL — ABNORMAL HIGH (ref 4.0–10.5)

## 2023-06-05 NOTE — Progress Notes (Signed)
 Please let patient know CXR showed decrease size pneumonia, will keep CT scheduled for end of June. Complete abx tomorrow

## 2023-06-05 NOTE — Telephone Encounter (Signed)
 Saw patient for hospital follow-up. Looked well. VSS, lungs clear. No further hemoptysis or fevers. Still has a cough with some yellow mucus and exertion dyspnea but better. He has one day left of Augmentin . Getting CXR today, scheduled for CT chest on 6/23  Does he still need to see ID?

## 2023-06-05 NOTE — Progress Notes (Signed)
 @Patient  ID: Lieutenant Reese, male    DOB: Dec 30, 1950, 73 y.o.   MRN: [id number]  No chief complaint on file.   Referring provider: Benedetta Bradley, *  HPI: 73 year old male, former smoker.  Past medical history significant for heart failure, hypertension, mild mitral regurgitation, COPD, lung nodule, GERD, meningioma, BPH, lumbar stenosis, opioid dependency, status post spinal cord stimulator.  Patient of Dr. Bertrum Brodie.   Previous [facility] pulmonary encounter:  01/16/2023 Discussed the use of AI scribe software for clinical note transcription with the patient, who gave verbal consent to proceed.   History of Present Illness   The patient, with a history of emphysema, coronary artery disease, and diastolic dysfunction, presented for a follow-up visit after a recent shortness of breath episode. The patient had undergone several tests, including lab work, HRCT scan, and a PET scan. The lab results indicated a mild elevation in BNP, suggesting possible fluid retention due to heart function, and positive allergy  results for various grasses. The patient's alpha 1 level was normal, ruling out a genetic predisposition for COPD.   The PET scan showed residual post-infectious or inflammatory scarring in the right upper lobe of the lung, likely due to a recent respiratory infection. The patient reported a history of coughing and mucus production a few months prior, which has since resolved. The PET scan also revealed evidence of previous radiation to the prostate but no signs of metastatic disease.   The patient's HRCT scan showed emphysema and consolidation in the right upper lobe, which has improved and left some scarring. The patient also has coronary artery calcification, indicating some plaque buildup, and an irregular liver margin, suggesting possible cirrhosis. The patient reported a history of moderate alcohol  consumption years ago.   The patient is currently on a regimen of Trelegy for  maintenance and Combivent  Respimat as a rescue inhaler for his emphysema. The frequency of Combivent  use varies, sometimes not needed, other times used once or twice a day. The patient also takes Lasix  and Jardiance  for heart-related issues and Simvastatin  for cholesterol management.   The patient reported some issues with fluid retention, which fluctuates and affects his mobility. He also expressed a desire for improved breathing, which may be affected by the fluid retention. The patient does not currently use oxygen and has not had a breathing test since 2015.    04/23/2023 -  Dr. Bertrum Brodie     Chief Complaint  Patient presents with   Acute Visit      Increased cough x 3 days. He has had some yellow sputum. Breathing has been slightly worse. He mowed grass last wk without a mask.      AWAB ABEBE 73 y.o. -returns for follow-up for his COPD associated with right upper lobe lung density/scarring [seen in November 2024 with improvement and PET scan December 2024].  He says a week ago he is here with his wife.  He says a week ago/16/25 was mowing the grass has been outdoors then from 04/21/2023 is having cough congestion yellow sputum wheezing.  All this has gotten better.  Today was having a low-grade fever.  He is still not back to baseline still having the symptoms.  Of note he did have a PET scan in December 2020 for additional improvement there but still the significant size of the scar tissue is partially compared to 2019 when it was all clear.  I would recommend a follow-up CT.  He has upcoming pulmonary function test May 2025.  Of  note he is on Trelegy but he prefers Breztri .  We gave him a sample and will send a prescription.   05/02/2023 Acute OV : Hemoptysis , COPD  Patient presents for an acute office visit.  He was seen in the office 1 week ago for COPD exacerbation.  He had developed acute symptoms after mowing the grass with cough, congestion, wheezing and low-grade fever.  He was  given doxycycline  x 5 days and a prednisone  burst.  Patient says he did have some improvement but continues to have ongoing symptoms of cough and congestion.  Patient says he has been coughing up pink-tinged mucus over the last 2 weeks and this morning he noticed that he had some dark red mixed in his mucus.  He coughed up several times this morning with bloody mucus.  No frank hemoptysis.  Has had some intermittent low-grade fever.  He says appetite is good.  No nausea vomiting diarrhea.  No recent travel.  No rash or joint swelling. Chest xray today shows a rounded left mid lung consolidation/mass    He has COPD with emphysema.  Remains on Breztri  twice daily.  Has Combivent  as needed for rescue inhaler.  Patient is not on any anticoagulation therapy.  Does not take aspirin  on a regular basis.    06/05/2023- INTERIM  Discussed the use of AI scribe software for clinical note transcription with the patient, who gave verbal consent to proceed.  History of Present Illness   EARNESTINE TUOHEY is a 73 year old male who presents for a hospital follow-up after treatment for community-acquired pneumonia with sepsis and acute hypoxic respiratory failure.  He was admitted to the hospital from May 21 to May 27 for community-acquired pneumonia with sepsis. Initially treated outpatient with Levaquin  and doxycycline , he showed some improvement but developed fever and confusion at home, leading to his admission for hypoxic respiratory failure. During hospitalization, a chest x-ray revealed fluid in the lungs, necessitating chest tube placement from May 22 to May 24. He received IV antibiotics, including Zosyn  and vancomycin . Blood cultures were negative, and Pseudomonas was initially suspected but later ruled out. He was discharged on Augmentin  for eight days post-hospitalization.  He feels better overall but still experiences some exertional shortness of breath and a persistent cough, especially in the mornings, with  yellow mucus production. No recent fevers or confusion since being home. He is not on oxygen and does not have a nebulizer at home but uses a rescue inhaler as needed, which helps. He takes Mucinex  regularly.  He has been on Lasix  20 mg daily and was directed to take an extra dose for a week but has not started this. He has lower extremity swelling, which has been a long-standing issue. He wears compression stockings to manage the swelling. He also takes oxycodone  as needed and reports no confusion or mental status changes at home.    He is not engaging in strenuous activities or yard work, and his brother-in-law assists with yard maintenance. He is able to perform daily activities such as showering, bathing, and attending appointments. He has an upcoming appointment with an infectious disease doctor on June 19 to evaluate recurrent pneumonia and is wondering if he still needs to keep this visit.   No recent fevers, confusion, or hemoptysis.      Allergies  Allergen Reactions   Amlodipine Besylate     Other Reaction(s): fatigue   Aspirin      Other Reaction(s): GI upset   Other Itching, Other (  See Comments), Rash and Dermatitis    VICRYL Sutures   Tape Itching, Other (See Comments) and Rash    Occlusive tape and bandaids   Wound Dressing Adhesive Rash    Immunization History  Administered Date(s) Administered   DTP 06/01/2001   Fluad Quad(high Dose 65+) 09/16/2019   Influenza Split 10/16/2010, 10/08/2012, 10/24/2012, 10/20/2013   Influenza, High Dose Seasonal PF 10/22/2014, 09/07/2015   Influenza-Unspecified 10/03/2018, 11/02/2022   PFIZER(Purple Top)SARS-COV-2 Vaccination 02/13/2019, 03/10/2019, 10/05/2019, 04/19/2020   Pfizer Covid-19 Vaccine Bivalent Booster 29yrs & up 09/29/2020   Pneumococcal Conjugate-13 10/22/2014   Pneumococcal Polysaccharide-23 02/10/2015   Tdap 09/18/2011, 05/17/2012   Zoster Recombinant(Shingrix) 08/18/2018   Zoster, Live 08/04/2010    Past Medical  History:  Diagnosis Date   Acute heart failure with preserved ejection fraction (HCC) 11/12/2022   Arthritis    Back pain    Radiates down both legs   BPH (benign prostatic hyperplasia)    Cancer (HCC) 2022   prostate cancer   COPD (chronic obstructive pulmonary disease) (HCC)    Dyspnea    Enlarged prostate    GERD (gastroesophageal reflux disease)    Glaucoma    H/O hiatal hernia    H/O wheezing    occ inhaler usage   Heart murmur    Hypertension    Lumbar foraminal stenosis    Macular degeneration    Meningioma (HCC) 2014   Treated with radiation   Neuromuscular disorder (HCC)    Neuropathy bilateral legs   PONV (postoperative nausea and vomiting)    Pulmonary nodules    resolved on 2019 follow up CT   S/P insertion of spinal cord stimulator     Tobacco History: Social History   Tobacco Use  Smoking Status Former   Current packs/day: 0.00   Average packs/day: 1.5 packs/day for 35.0 years (52.5 ttl pk-yrs)   Types: Cigarettes   Start date: 05/03/1965   Quit date: 05/03/2000   Years since quitting: 23.1  Smokeless Tobacco Never  Tobacco Comments   quit alcohol  73   Counseling given: Not Answered Tobacco comments: quit alcohol  73   Outpatient Medications Prior to Visit  Medication Sig Dispense Refill   AFRIN 12 HOUR 0.05 % nasal spray Place 1 spray into both nostrils 2 (two) times daily as needed for congestion.     amoxicillin -clavulanate (AUGMENTIN ) 875-125 MG tablet Take 1 tablet by mouth 2 (two) times daily for 8 days. 16 tablet 0   Ascorbic Acid  (VITAMIN C ) 1000 MG tablet Take 1,000 mg by mouth daily.     aspirin  EC 81 MG tablet Take 1 tablet (81 mg total) by mouth daily. Swallow whole. (Patient not taking: Reported on 05/22/2023)     budesonide -glycopyrrolate -formoterol  (BREZTRI  AEROSPHERE) 160-9-4.8 MCG/ACT AERO inhaler Inhale 2 puffs into the lungs in the morning and at bedtime. 10.7 g 11   celecoxib  (CELEBREX ) 200 MG capsule Take 1 capsule (200 mg total)  by mouth 2 (two) times daily. 180 capsule 0   chlorhexidine  (HIBICLENS ) 4 % external liquid Apply 1 Application topically daily as needed (boils).     Cholecalciferol  (VITAMIN D3) 50 MCG (2000 UT) capsule Take 4,000 Units by mouth daily.     diphenhydrAMINE  (BENADRYL  ALLERGY ) 25 MG tablet Take 25 mg by mouth at bedtime.     empagliflozin  (JARDIANCE ) 10 MG TABS tablet Take 1 tablet (10 mg total) by mouth daily. 90 tablet 0   finasteride  (PROSCAR ) 5 MG tablet Take 5 mg by mouth daily.  fluticasone  (FLONASE ) 50 MCG/ACT nasal spray Place 1 spray into both nostrils 2 (two) times daily.     furosemide  (LASIX ) 20 MG tablet Take 1 tablet (20 mg total) by mouth 2 (two) times daily for 7 days, THEN 1 tablet (20 mg total) daily for 21 days. 90 tablet 0   gabapentin  (NEURONTIN ) 300 MG capsule Take 1 capsule (300 mg total) by mouth 4 (four) times daily. 360 capsule 0   Guaifenesin  (MUCINEX  MAXIMUM STRENGTH) 1200 MG TB12 Take 1,200 mg by mouth 2 (two) times daily.     Ipratropium-Albuterol  (COMBIVENT  RESPIMAT) 20-100 MCG/ACT AERS respimat Inhale 1 puff into the lungs every 6 (six) hours as needed for wheezing or shortness of breath. 12 g 3   Lutein-Zeaxanthin 25-5 MG CAPS Take 1 Dose by mouth daily.     magic mouthwash w/lidocaine  SOLN Take 15 mLs by mouth 4 (four) times daily as needed for mouth pain. contains equal amounts of Maalox Extra Strength, nystatin, diphenhydramine  and lidocaine . 1000 mL 0   magnesium  hydroxide (MILK OF MAGNESIA) 400 MG/5ML suspension Take 15 mLs by mouth at bedtime.     metoprolol  succinate (TOPROL -XL) 100 MG 24 hr tablet Take 1 tablet (100 mg total) by mouth at bedtime. Take with or immediately following a meal. 90 tablet 0   montelukast  (SINGULAIR ) 10 MG tablet TAKE 1 TABLET BY MOUTH AT  BEDTIME 90 tablet 3   Multiple Vitamin (MULTIVITAMIN WITH MINERALS) TABS tablet Take 1 tablet by mouth daily.     omeprazole  (PRILOSEC) 40 MG capsule Take 1 capsule (40 mg total) by mouth daily  before breakfast. 90 capsule 3   Oxycodone  HCl 10 MG TABS Take 10 mg by mouth every 4 (four) hours.     Propylene Glycol, PF, (SYSTANE COMPLETE PF) 0.6 % SOLN Place 1 drop into both eyes 3 (three) times daily as needed (dry eyes).     rosuvastatin  (CRESTOR ) 20 MG tablet Take 1 tablet (20 mg total) by mouth at bedtime. 90 tablet 0   Simethicone  125 MG CAPS Take 250 mg by mouth 4 (four) times daily as needed (for gas).     sodium chloride  (OCEAN) 0.65 % SOLN nasal spray Place 1 spray into both nostrils 2 (two) times daily.     Tamsulosin  HCl (FLOMAX ) 0.4 MG CAPS Take 0.4 mg by mouth 2 (two) times daily.     telmisartan  (MICARDIS ) 80 MG tablet Take 0.5 tablets (40 mg total) by mouth 2 (two) times daily. 90 tablet 0   timolol (TIMOPTIC) 0.5 % ophthalmic solution Place 1 drop into both eyes at bedtime.     No facility-administered medications prior to visit.    Review of Systems  Review of Systems  Constitutional: Negative.   HENT: Negative.    Respiratory:  Positive for cough.    Physical Exam  There were no vitals taken for this visit. Physical Exam Constitutional:      General: He is not in acute distress.    Appearance: He is well-developed. He is not ill-appearing.  HENT:     Head: Normocephalic and atraumatic.  Cardiovascular:     Rate and Rhythm: Normal rate and regular rhythm.  Pulmonary:     Breath sounds: No decreased breath sounds, wheezing or rhonchi.  Musculoskeletal:        General: Normal range of motion.  Skin:    General: Skin is warm and dry.  Neurological:     General: No focal deficit present.     Mental Status:  He is alert and oriented to person, place, and time.  Psychiatric:        Mood and Affect: Mood normal.        Behavior: Behavior normal.      Lab Results:  CBC    Component Value Date/Time   WBC 18.2 (H) 05/25/2023 0428   RBC 4.73 05/25/2023 0428   HGB 13.8 05/25/2023 0428   HCT 42.5 05/25/2023 0428   PLT 360 05/25/2023 0428   MCV 89.9  05/25/2023 0428   MCH 29.2 05/25/2023 0428   MCHC 32.5 05/25/2023 0428   RDW 13.1 05/25/2023 0428   LYMPHSABS 1.0 05/25/2023 0428   MONOABS 0.8 05/25/2023 0428   EOSABS 0.1 05/25/2023 0428   BASOSABS 0.1 05/25/2023 0428    BMET    Component Value Date/Time   NA 132 (L) 05/25/2023 0428   NA 141 03/21/2023 1307   K 4.0 05/25/2023 0428   CL 97 (L) 05/25/2023 0428   CO2 25 05/25/2023 0428   GLUCOSE 97 05/25/2023 0428   BUN 23 05/25/2023 0428   BUN 18 03/21/2023 1307   BUN 22.8 08/05/2014 1203   CREATININE 0.63 05/25/2023 0428   CREATININE 1.0 08/05/2014 1203   CALCIUM  8.3 (L) 05/25/2023 0428   GFRNONAA >60 05/25/2023 0428   GFRAA 100 01/29/2020 1430    BNP    Component Value Date/Time   BNP 165.8 (H) 05/22/2023 1005    ProBNP    Component Value Date/Time   PROBNP 335 03/21/2023 1307   PROBNP 119.0 (H) 11/15/2022 1450    Imaging: DG CHEST PORT 1 VIEW Result Date: 05/26/2023 CLINICAL DATA:  Empyema. EXAM: PORTABLE CHEST 1 VIEW COMPARISON:  05/24/2023 FINDINGS: Lungs are adequately inflated with stable elevation of the left hemidiaphragm. Right lung is clear. Persistent masslike opacification over the lateral left mid to lower lung. Persistent pleural thickening with possible small persistent collection of air along the lateral mid to lower left thorax likely representing patient's persistent small hydropneumothorax. Cardiomediastinal silhouette and remainder of the exam is unchanged. IMPRESSION: Persistent masslike opacification over the lateral left mid to lower lung with persistent pleural thickening including possible small persistent collection of air along the lateral mid to lower left thorax likely representing patient's persistent small hydropneumothorax. Electronically Signed   By: Roda Cirri M.D.   On: 05/26/2023 14:45   CT CHEST WO CONTRAST Result Date: 05/24/2023 CLINICAL DATA:  Pneumonia, complication suspected, xray done, left hydropneumothorax, abnormal x-ray  EXAM: CT CHEST WITHOUT CONTRAST TECHNIQUE: Multidetector CT imaging of the chest was performed following the standard protocol without IV contrast. RADIATION DOSE REDUCTION: This exam was performed according to the departmental dose-optimization program which includes automated exposure control, adjustment of the mA and/or kV according to patient size and/or use of iterative reconstruction technique. COMPARISON:  05/24/2023, 11/28/2022 FINDINGS: Cardiovascular: Unenhanced imaging of the heart is unremarkable without pericardial effusion. Normal caliber of the thoracic aorta. Atherosclerosis of the aorta and coronary vasculature. Assessment of the vascular lumen cannot be performed without intravenous contrast. Mediastinum/Nodes: No enlarged mediastinal or axillary lymph nodes. Thyroid  gland, trachea, and esophagus demonstrate no significant findings. Lungs/Pleura: Left-sided pigtail pleural drainage catheter is seen coiled along the medial costophrenic angle. Mild diffuse left pleural thickening, with trace left hydropneumothorax identified. Gas component far less than 5%. Fluid component far less than 100 cc. Large area of masslike left upper lobe consolidation is noted, measuring 7.8 by 6.8 x 7.8 cm, corresponding to the chest x-ray finding. Punctate foci of gas within  the consolidated lung consistent with central cavitation. There is mass effect upon the major fissure. Overall, findings favor cavitating pneumonia over malignancy given interval development since the 11/28/2022 CT. Interval resolution of the cavitating right upper lobe consolidation seen previously, with postinflammatory scarring noted. No right-sided airspace disease, effusion, or pneumothorax. Upper Abdomen: No acute abnormality. Musculoskeletal: Postsurgical changes within the cervical and thoracic spine. Spinal stimulator in place. No acute displaced fractures. IMPRESSION: 1. Masslike consolidation left upper lobe with areas of central gas  lucency, favor cavitating pneumonia over malignancy given rapid development since 11/28/2022 exam. 2. Trace left hydropneumothorax as above, with mild diffuse left pleural thickening. Indwelling pigtail pleural drainage catheter within the left medial costophrenic angle. 3. Interval resolution of the cavitating consolidation within the right upper lobe on prior study, with resulting postinflammatory scarring noted. 4. Aortic Atherosclerosis (ICD10-I70.0). Coronary artery atherosclerosis. Electronically Signed   By: Bobbye Burrow M.D.   On: 05/24/2023 15:57   DG CHEST PORT 1 VIEW Result Date: 05/24/2023 CLINICAL DATA:  Follow-up left pneumothorax and chest tube. EXAM: PORTABLE CHEST 1 VIEW COMPARISON:  05/23/2023 FINDINGS: The previously demonstrated small left lateral hydropneumothorax is slightly larger, measuring 1.3 cm in thickness today, previously 0.8 cm. There is a better defined adjacent rounded mass-like density. Decreased patchy density in the left lower lung zone. Stable left lower lung zone pigtail pleural catheter. Clear right lung. Normal-sized heart. Neural stimulator leads overlying the midthoracic spine. Cervical and lumbar spine fixation hardware. Lower thoracic spine degenerative changes. IMPRESSION: 1. Slightly larger small left lateral hydropneumothorax, currently occupying approximately 5% of the left hemithorax. 2. Better defined adjacent rounded mass-like density. 3. Decreased patchy density in the left lower lung zone, likely due to resolving atelectasis. Electronically Signed   By: Catherin Closs M.D.   On: 05/24/2023 11:50   DG Chest 1 View Result Date: 05/23/2023 CLINICAL DATA:  Chest tube in place. EXAM: CHEST  1 VIEW COMPARISON:  Chest radiograph dated 05/23/2023. FINDINGS: Similar positioning of the left chest tube. Stable left lung base opacity. Small left pneumothorax similar to prior radiograph. Stable cardiac silhouette. No acute osseous pathology. IMPRESSION: No interval  change. Electronically Signed   By: Angus Bark M.D.   On: 05/23/2023 12:13   DG Chest Port 1 View Result Date: 05/23/2023 CLINICAL DATA:  Chest tube in place. EXAM: PORTABLE CHEST 1 VIEW COMPARISON:  May 22, 2023. FINDINGS: The heart size and mediastinal contours are within normal limits. Right lung is clear. Stable left-sided pleural drainage catheter. Decreased loculated left pleural effusion is noted with small pneumothorax component. Left basilar atelectasis is noted. The visualized skeletal structures are unremarkable. IMPRESSION: Stable left-sided pleural drainage catheter. Loculated left hydropneumothorax is noted which is slightly decreased compared to prior exam. Electronically Signed   By: Rosalene Colon M.D.   On: 05/23/2023 07:53   DG CHEST PORT 1 VIEW Result Date: 05/22/2023 CLINICAL DATA:  Chest tube in place EXAM: PORTABLE CHEST 1 VIEW COMPARISON:  Chest x-ray 05/22/2023 FINDINGS: There is a moderate size left pleural effusion, unchanged. Questionable tiny left pneumothorax component seen near the apex. Focal opacity in the left mid and lower lung appears unchanged. There is a new left-sided chest tube in the lower left hemithorax. The right lung is clear. The cardiomediastinal silhouette is stable. Osseous structures are stable. IMPRESSION: 1. New left-sided chest tube in place. 2. Moderate size left pleural effusion, unchanged. Questionable tiny left pneumothorax component seen near the apex which may be related to chest tube  placement. 3. Focal opacity in the left mid and lower lung appears unchanged. Electronically Signed   By: Tyron Gallon M.D.   On: 05/22/2023 16:54   DG Chest Port 1 View Result Date: 05/22/2023 CLINICAL DATA:  Sepsis. EXAM: PORTABLE CHEST 1 VIEW COMPARISON:  Chest radiograph dated 05/16/2023. FINDINGS: Interval development of a moderate left pleural effusion with opacification of the majority of the left mid to lower lung field. The right lung is clear. No  pneumothorax. The cardiac silhouette. No acute osseous pathology. IMPRESSION: Moderate left pleural effusion with opacification of the majority of the left mid to lower lung field. Electronically Signed   By: Angus Bark M.D.   On: 05/22/2023 12:28   DG Chest 2 View Result Date: 05/16/2023 CLINICAL DATA:  Pneumonia. Hemoptysis. Lung mass. History of prostate cancer EXAM: CHEST - 2 VIEW COMPARISON:  Chest x-ray 05/02/2023 FINDINGS: Hyperinflation. Slight increasing small left pleural effusion or thickening. Rounded masslike area in the left midlung is again identified. Dimensions approach 6 cm on this x-ray. Recommend further workup such as CT scan right lung is grossly clear. No right-sided consolidation. No edema or pneumothorax. Normal cardiopericardial silhouette calcified aorta. Fixation hardware along the cervical spine and lumbar spine at the edge of the imaging field. Neurostimulator leads overlying the central canal of the thoracic spine. Degenerative changes are noted. IMPRESSION: Persistent masslike opacity in the left midlung, upper lobe. Slight increase in small left pleural effusion. Recommend further workup as this has a differential including a malignant lesion such as with CT when appropriate. Hyperinflation. Electronically Signed   By: Adrianna Horde M.D.   On: 05/16/2023 15:26     Assessment & Plan:   1. Cavitary lesion of lung (Primary) - DG Chest 2 View; Future - CBC with Differential; Future  2. Community acquired pneumonia, unspecified laterality - CBC with Differential; Future  Assessment and Plan    Community-acquired pneumonia Admitted for community-acquired pneumonia with sepsis. Initially treated outpatient with Levaquin  and doxycycline . Developed fever and confusion, leading to hospital admission. Pseudomonas was initially suspected but later ruled out. Discharged on Augmentin  for 8 days. Currently experiencing a mild cough with yellow sputum, but no fever or  hemoptysis. Symptoms have progressively improved. - Chest x-ray showed decreasing size left lateral consolidation  - Continue Augmentin  as prescribed, last day is 06/06/23 - Continue Mucinex  twice daily to loosen congestion - CT chest scheduled for June 23rd   Acute hypoxic respiratory failure Resolved/ Admitted for acute hypoxic respiratory failure secondary to pneumonia. No longer on supplemental oxygen. Reports mild dyspnea, especially with exertion, but well-managed at rest. Recovery expected to take up to six weeks. - Perform walk test to assess oxygen levels during exertion - Advise against strenuous activities and yard work for 4 weeks - Encourage light activities such as showering, bathing, shopping and attending appointments  Sepsis due to pneumonia Sepsis was diagnosed during hospitalization with fever and leukocytosis. Blood cultures were negative. Confusion and altered mental status were attributed to sepsis and have resolved.  -Getting updated CBC with diff today and CXR  Pleural effusion Pleural effusion was present during hospitalization, treated with chest tube and antibiotics. Currently, lung sounds are clear with no significant fluid detected. No oxygen requirements.  -Advised patient take extra 20mg  lasix  daily x7 as instructed post discharge   Peripheral edema Chronic peripheral edema with swelling in legs. He is on Lasix , with an extra dose recommended for 7 days to manage fluid retention. Compression stockings are being used. -  Take an extra dose of Lasix  for 7 days - Wear compression stockings as needed  Antonio Baumgarten, NP 06/05/2023

## 2023-06-05 NOTE — Patient Instructions (Signed)
 -  COMMUNITY-ACQUIRED PNEUMONIA: Community-acquired pneumonia is a lung infection you got outside of a hospital setting. You were treated with antibiotics and are now on Augmentin  which you will complete tomorrow. We will order a chest x-ray to check your progress and may consider a CT scan based on the results. Continue taking Mucinex  to help manage your cough and Breztri  Aerosphere 2 puffs morning and evening  -ACUTE HYPOXIC RESPIRATORY FAILURE: Acute hypoxic respiratory failure means your lungs were not getting enough oxygen due to the pneumonia. You are no longer on supplemental oxygen but still experience mild shortness of breath. We will perform a walk test to check your oxygen levels during activity. Avoid strenuous activities and yard work for the next 4 weeks, but you can continue light activities like showering and attending appointments.  -PLEURAL EFFUSION: Pleural effusion is the buildup of fluid around your lungs. This was treated with a chest tube during your hospital stay. Your lung sounds are now clear.   -PERIPHERAL EDEMA: Peripheral edema is swelling in your legs due to fluid retention. You are taking Lasix  and wearing compression stockings to manage this. Continue taking an extra dose of Lasix  for 7 days and wear your compression stockings as needed.   Orders: CXR and labs today Ambulatory walk test on room air to assess oxygen need   Follow-up Please schedule a follow-up in June after CT chest with either Dr. Bertrum Brodie (15 min ok) or Jerlene Moody NP

## 2023-06-06 DIAGNOSIS — A419 Sepsis, unspecified organism: Secondary | ICD-10-CM | POA: Diagnosis not present

## 2023-06-06 DIAGNOSIS — J189 Pneumonia, unspecified organism: Secondary | ICD-10-CM | POA: Diagnosis not present

## 2023-06-06 DIAGNOSIS — E119 Type 2 diabetes mellitus without complications: Secondary | ICD-10-CM | POA: Diagnosis not present

## 2023-06-06 DIAGNOSIS — J869 Pyothorax without fistula: Secondary | ICD-10-CM | POA: Diagnosis not present

## 2023-06-06 DIAGNOSIS — I2510 Atherosclerotic heart disease of native coronary artery without angina pectoris: Secondary | ICD-10-CM | POA: Diagnosis not present

## 2023-06-06 DIAGNOSIS — J9601 Acute respiratory failure with hypoxia: Secondary | ICD-10-CM | POA: Diagnosis not present

## 2023-06-06 DIAGNOSIS — J441 Chronic obstructive pulmonary disease with (acute) exacerbation: Secondary | ICD-10-CM | POA: Diagnosis not present

## 2023-06-06 DIAGNOSIS — I10 Essential (primary) hypertension: Secondary | ICD-10-CM | POA: Diagnosis not present

## 2023-06-06 DIAGNOSIS — M7989 Other specified soft tissue disorders: Secondary | ICD-10-CM | POA: Diagnosis not present

## 2023-06-06 DIAGNOSIS — J440 Chronic obstructive pulmonary disease with acute lower respiratory infection: Secondary | ICD-10-CM | POA: Diagnosis not present

## 2023-06-06 DIAGNOSIS — E871 Hypo-osmolality and hyponatremia: Secondary | ICD-10-CM | POA: Diagnosis not present

## 2023-06-06 DIAGNOSIS — M48061 Spinal stenosis, lumbar region without neurogenic claudication: Secondary | ICD-10-CM | POA: Diagnosis not present

## 2023-06-06 DIAGNOSIS — E785 Hyperlipidemia, unspecified: Secondary | ICD-10-CM | POA: Diagnosis not present

## 2023-06-06 NOTE — Telephone Encounter (Signed)
 Does not need  to see ID

## 2023-06-07 ENCOUNTER — Ambulatory Visit: Admitting: Pharmacist

## 2023-06-07 NOTE — Telephone Encounter (Signed)
 Thanks, Ashlyn please let patient he can cancel ID apt/ cancel referral

## 2023-06-12 DIAGNOSIS — Z0000 Encounter for general adult medical examination without abnormal findings: Secondary | ICD-10-CM | POA: Diagnosis not present

## 2023-06-12 DIAGNOSIS — H353231 Exudative age-related macular degeneration, bilateral, with active choroidal neovascularization: Secondary | ICD-10-CM | POA: Diagnosis not present

## 2023-06-12 DIAGNOSIS — I5189 Other ill-defined heart diseases: Secondary | ICD-10-CM | POA: Diagnosis not present

## 2023-06-12 DIAGNOSIS — E559 Vitamin D deficiency, unspecified: Secondary | ICD-10-CM | POA: Diagnosis not present

## 2023-06-12 DIAGNOSIS — Z79899 Other long term (current) drug therapy: Secondary | ICD-10-CM | POA: Diagnosis not present

## 2023-06-12 DIAGNOSIS — G894 Chronic pain syndrome: Secondary | ICD-10-CM | POA: Diagnosis not present

## 2023-06-12 DIAGNOSIS — I10 Essential (primary) hypertension: Secondary | ICD-10-CM | POA: Diagnosis not present

## 2023-06-12 DIAGNOSIS — K22719 Barrett's esophagus with dysplasia, unspecified: Secondary | ICD-10-CM | POA: Diagnosis not present

## 2023-06-12 DIAGNOSIS — R748 Abnormal levels of other serum enzymes: Secondary | ICD-10-CM | POA: Diagnosis not present

## 2023-06-12 DIAGNOSIS — J449 Chronic obstructive pulmonary disease, unspecified: Secondary | ICD-10-CM | POA: Diagnosis not present

## 2023-06-12 DIAGNOSIS — I700 Atherosclerosis of aorta: Secondary | ICD-10-CM | POA: Diagnosis not present

## 2023-06-12 DIAGNOSIS — J984 Other disorders of lung: Secondary | ICD-10-CM | POA: Diagnosis not present

## 2023-06-12 NOTE — Telephone Encounter (Signed)
 MR does not have anything sooner and patient is already added to the wait list.

## 2023-06-12 NOTE — Telephone Encounter (Signed)
 Pt called me back. Pt informed of Beth's note and will cancel the ID appointment. Pt also would like to know if he could get a sooner appointment than August to review the CT results. I informed pt that we could try but if not, then to keep August appointment. Pt verbalized understanding. Routing to the front desk to see about an earlier appointment. NFN

## 2023-06-12 NOTE — Telephone Encounter (Signed)
 ATC X1. LMTCB. Tagging [facility] so they can cancel referral.

## 2023-06-20 ENCOUNTER — Ambulatory Visit: Admitting: Internal Medicine

## 2023-06-24 ENCOUNTER — Ambulatory Visit (HOSPITAL_BASED_OUTPATIENT_CLINIC_OR_DEPARTMENT_OTHER)
Admission: RE | Admit: 2023-06-24 | Discharge: 2023-06-24 | Disposition: A | Discharge status: Home / Self Care | Source: Ambulatory Visit | Attending: Internal Medicine | Admitting: Internal Medicine

## 2023-06-24 DIAGNOSIS — I700 Atherosclerosis of aorta: Secondary | ICD-10-CM | POA: Diagnosis not present

## 2023-06-24 DIAGNOSIS — J440 Chronic obstructive pulmonary disease with acute lower respiratory infection: Secondary | ICD-10-CM | POA: Diagnosis not present

## 2023-06-24 DIAGNOSIS — G894 Chronic pain syndrome: Secondary | ICD-10-CM | POA: Diagnosis not present

## 2023-06-24 DIAGNOSIS — E871 Hypo-osmolality and hyponatremia: Secondary | ICD-10-CM | POA: Diagnosis not present

## 2023-06-24 DIAGNOSIS — I110 Hypertensive heart disease with heart failure: Secondary | ICD-10-CM | POA: Diagnosis not present

## 2023-06-24 DIAGNOSIS — Z823 Family history of stroke: Secondary | ICD-10-CM | POA: Diagnosis not present

## 2023-06-24 DIAGNOSIS — Z825 Family history of asthma and other chronic lower respiratory diseases: Secondary | ICD-10-CM | POA: Diagnosis not present

## 2023-06-24 DIAGNOSIS — Z79899 Other long term (current) drug therapy: Secondary | ICD-10-CM | POA: Diagnosis not present

## 2023-06-24 DIAGNOSIS — Z886 Allergy status to analgesic agent status: Secondary | ICD-10-CM | POA: Diagnosis not present

## 2023-06-24 DIAGNOSIS — Z87891 Personal history of nicotine dependence: Secondary | ICD-10-CM | POA: Diagnosis not present

## 2023-06-24 DIAGNOSIS — K219 Gastro-esophageal reflux disease without esophagitis: Secondary | ICD-10-CM | POA: Diagnosis not present

## 2023-06-24 DIAGNOSIS — I10 Essential (primary) hypertension: Secondary | ICD-10-CM | POA: Diagnosis not present

## 2023-06-24 DIAGNOSIS — J90 Pleural effusion, not elsewhere classified: Secondary | ICD-10-CM | POA: Diagnosis not present

## 2023-06-24 DIAGNOSIS — R0602 Shortness of breath: Secondary | ICD-10-CM | POA: Diagnosis not present

## 2023-06-24 DIAGNOSIS — E782 Mixed hyperlipidemia: Secondary | ICD-10-CM | POA: Diagnosis not present

## 2023-06-24 DIAGNOSIS — J984 Other disorders of lung: Secondary | ICD-10-CM | POA: Diagnosis not present

## 2023-06-24 DIAGNOSIS — H409 Unspecified glaucoma: Secondary | ICD-10-CM | POA: Diagnosis not present

## 2023-06-24 DIAGNOSIS — R911 Solitary pulmonary nodule: Secondary | ICD-10-CM | POA: Diagnosis not present

## 2023-06-24 DIAGNOSIS — Z9079 Acquired absence of other genital organ(s): Secondary | ICD-10-CM | POA: Diagnosis not present

## 2023-06-24 DIAGNOSIS — R918 Other nonspecific abnormal finding of lung field: Secondary | ICD-10-CM | POA: Diagnosis not present

## 2023-06-24 DIAGNOSIS — J929 Pleural plaque without asbestos: Secondary | ICD-10-CM | POA: Diagnosis not present

## 2023-06-24 DIAGNOSIS — Z7984 Long term (current) use of oral hypoglycemic drugs: Secondary | ICD-10-CM | POA: Diagnosis not present

## 2023-06-24 DIAGNOSIS — J9601 Acute respiratory failure with hypoxia: Secondary | ICD-10-CM | POA: Diagnosis not present

## 2023-06-24 DIAGNOSIS — Z8249 Family history of ischemic heart disease and other diseases of the circulatory system: Secondary | ICD-10-CM | POA: Diagnosis not present

## 2023-06-24 DIAGNOSIS — Z923 Personal history of irradiation: Secondary | ICD-10-CM | POA: Diagnosis not present

## 2023-06-24 DIAGNOSIS — J189 Pneumonia, unspecified organism: Secondary | ICD-10-CM | POA: Diagnosis not present

## 2023-06-24 DIAGNOSIS — J159 Unspecified bacterial pneumonia: Secondary | ICD-10-CM | POA: Diagnosis present

## 2023-06-24 DIAGNOSIS — N401 Enlarged prostate with lower urinary tract symptoms: Secondary | ICD-10-CM | POA: Diagnosis not present

## 2023-06-24 DIAGNOSIS — E860 Dehydration: Secondary | ICD-10-CM | POA: Diagnosis not present

## 2023-06-24 DIAGNOSIS — Z7951 Long term (current) use of inhaled steroids: Secondary | ICD-10-CM | POA: Diagnosis not present

## 2023-06-24 DIAGNOSIS — Z888 Allergy status to other drugs, medicaments and biological substances status: Secondary | ICD-10-CM | POA: Diagnosis not present

## 2023-06-24 DIAGNOSIS — N179 Acute kidney failure, unspecified: Secondary | ICD-10-CM | POA: Diagnosis not present

## 2023-06-24 DIAGNOSIS — I5033 Acute on chronic diastolic (congestive) heart failure: Secondary | ICD-10-CM | POA: Diagnosis not present

## 2023-06-24 DIAGNOSIS — N138 Other obstructive and reflux uropathy: Secondary | ICD-10-CM | POA: Diagnosis not present

## 2023-06-25 ENCOUNTER — Other Ambulatory Visit: Payer: Self-pay

## 2023-06-25 ENCOUNTER — Encounter (HOSPITAL_BASED_OUTPATIENT_CLINIC_OR_DEPARTMENT_OTHER): Payer: Self-pay | Admitting: Emergency Medicine

## 2023-06-25 ENCOUNTER — Inpatient Hospital Stay (HOSPITAL_BASED_OUTPATIENT_CLINIC_OR_DEPARTMENT_OTHER)
Admission: EM | Admit: 2023-06-25 | Discharge: 2023-06-29 | DRG: 193 | Disposition: A | Discharge status: Home / Self Care | Attending: Internal Medicine | Admitting: Internal Medicine

## 2023-06-25 ENCOUNTER — Emergency Department (HOSPITAL_BASED_OUTPATIENT_CLINIC_OR_DEPARTMENT_OTHER)

## 2023-06-25 DIAGNOSIS — E860 Dehydration: Secondary | ICD-10-CM | POA: Diagnosis present

## 2023-06-25 DIAGNOSIS — I110 Hypertensive heart disease with heart failure: Secondary | ICD-10-CM | POA: Diagnosis present

## 2023-06-25 DIAGNOSIS — I10 Essential (primary) hypertension: Secondary | ICD-10-CM | POA: Diagnosis not present

## 2023-06-25 DIAGNOSIS — R0602 Shortness of breath: Secondary | ICD-10-CM | POA: Diagnosis not present

## 2023-06-25 DIAGNOSIS — Z87891 Personal history of nicotine dependence: Secondary | ICD-10-CM

## 2023-06-25 DIAGNOSIS — J9601 Acute respiratory failure with hypoxia: Secondary | ICD-10-CM | POA: Diagnosis present

## 2023-06-25 DIAGNOSIS — Z825 Family history of asthma and other chronic lower respiratory diseases: Secondary | ICD-10-CM

## 2023-06-25 DIAGNOSIS — Z8249 Family history of ischemic heart disease and other diseases of the circulatory system: Secondary | ICD-10-CM | POA: Diagnosis not present

## 2023-06-25 DIAGNOSIS — N138 Other obstructive and reflux uropathy: Secondary | ICD-10-CM | POA: Diagnosis present

## 2023-06-25 DIAGNOSIS — Z91048 Other nonmedicinal substance allergy status: Secondary | ICD-10-CM

## 2023-06-25 DIAGNOSIS — J90 Pleural effusion, not elsewhere classified: Secondary | ICD-10-CM | POA: Diagnosis not present

## 2023-06-25 DIAGNOSIS — E782 Mixed hyperlipidemia: Secondary | ICD-10-CM | POA: Diagnosis present

## 2023-06-25 DIAGNOSIS — E871 Hypo-osmolality and hyponatremia: Secondary | ICD-10-CM | POA: Diagnosis present

## 2023-06-25 DIAGNOSIS — J449 Chronic obstructive pulmonary disease, unspecified: Secondary | ICD-10-CM | POA: Diagnosis present

## 2023-06-25 DIAGNOSIS — I5033 Acute on chronic diastolic (congestive) heart failure: Secondary | ICD-10-CM | POA: Diagnosis present

## 2023-06-25 DIAGNOSIS — J159 Unspecified bacterial pneumonia: Secondary | ICD-10-CM | POA: Diagnosis present

## 2023-06-25 DIAGNOSIS — N179 Acute kidney failure, unspecified: Secondary | ICD-10-CM | POA: Diagnosis present

## 2023-06-25 DIAGNOSIS — G894 Chronic pain syndrome: Secondary | ICD-10-CM | POA: Diagnosis present

## 2023-06-25 DIAGNOSIS — Z923 Personal history of irradiation: Secondary | ICD-10-CM

## 2023-06-25 DIAGNOSIS — Z8546 Personal history of malignant neoplasm of prostate: Secondary | ICD-10-CM

## 2023-06-25 DIAGNOSIS — Z9079 Acquired absence of other genital organ(s): Secondary | ICD-10-CM | POA: Diagnosis not present

## 2023-06-25 DIAGNOSIS — Z823 Family history of stroke: Secondary | ICD-10-CM | POA: Diagnosis not present

## 2023-06-25 DIAGNOSIS — Z886 Allergy status to analgesic agent status: Secondary | ICD-10-CM | POA: Diagnosis not present

## 2023-06-25 DIAGNOSIS — Z7951 Long term (current) use of inhaled steroids: Secondary | ICD-10-CM

## 2023-06-25 DIAGNOSIS — N401 Enlarged prostate with lower urinary tract symptoms: Secondary | ICD-10-CM | POA: Diagnosis present

## 2023-06-25 DIAGNOSIS — E7800 Pure hypercholesterolemia, unspecified: Secondary | ICD-10-CM | POA: Diagnosis present

## 2023-06-25 DIAGNOSIS — R918 Other nonspecific abnormal finding of lung field: Secondary | ICD-10-CM | POA: Diagnosis not present

## 2023-06-25 DIAGNOSIS — Z79899 Other long term (current) drug therapy: Secondary | ICD-10-CM | POA: Diagnosis not present

## 2023-06-25 DIAGNOSIS — H409 Unspecified glaucoma: Secondary | ICD-10-CM | POA: Diagnosis present

## 2023-06-25 DIAGNOSIS — J189 Pneumonia, unspecified organism: Principal | ICD-10-CM | POA: Diagnosis present

## 2023-06-25 DIAGNOSIS — Z888 Allergy status to other drugs, medicaments and biological substances status: Secondary | ICD-10-CM

## 2023-06-25 DIAGNOSIS — J929 Pleural plaque without asbestos: Secondary | ICD-10-CM | POA: Diagnosis not present

## 2023-06-25 DIAGNOSIS — K219 Gastro-esophageal reflux disease without esophagitis: Secondary | ICD-10-CM | POA: Diagnosis present

## 2023-06-25 DIAGNOSIS — J440 Chronic obstructive pulmonary disease with acute lower respiratory infection: Secondary | ICD-10-CM | POA: Diagnosis present

## 2023-06-25 DIAGNOSIS — G8929 Other chronic pain: Secondary | ICD-10-CM | POA: Diagnosis present

## 2023-06-25 DIAGNOSIS — Z7984 Long term (current) use of oral hypoglycemic drugs: Secondary | ICD-10-CM

## 2023-06-25 DIAGNOSIS — I5032 Chronic diastolic (congestive) heart failure: Secondary | ICD-10-CM | POA: Insufficient documentation

## 2023-06-25 DIAGNOSIS — E785 Hyperlipidemia, unspecified: Secondary | ICD-10-CM | POA: Diagnosis present

## 2023-06-25 LAB — COMPREHENSIVE METABOLIC PANEL WITH GFR
ALT: 18 U/L (ref 0–44)
AST: 27 U/L (ref 15–41)
Albumin: 3.7 g/dL (ref 3.5–5.0)
Alkaline Phosphatase: 132 U/L — ABNORMAL HIGH (ref 38–126)
Anion gap: 12 (ref 5–15)
BUN: 29 mg/dL — ABNORMAL HIGH (ref 8–23)
CO2: 23 mmol/L (ref 22–32)
Calcium: 9.3 mg/dL (ref 8.9–10.3)
Chloride: 97 mmol/L — ABNORMAL LOW (ref 98–111)
Creatinine, Ser: 1.29 mg/dL — ABNORMAL HIGH (ref 0.61–1.24)
GFR, Estimated: 59 mL/min — ABNORMAL LOW (ref >60)
Glucose, Bld: 98 mg/dL (ref 70–99)
Potassium: 4.3 mmol/L (ref 3.5–5.1)
Sodium: 132 mmol/L — ABNORMAL LOW (ref 135–145)
Total Bilirubin: 0.8 mg/dL (ref 0.0–1.2)
Total Protein: 6.6 g/dL (ref 6.5–8.1)

## 2023-06-25 LAB — CBC WITH DIFFERENTIAL/PLATELET
Abs Immature Granulocytes: 0.12 10*3/uL — ABNORMAL HIGH (ref 0.00–0.07)
Basophils Absolute: 0.1 10*3/uL (ref 0.0–0.1)
Basophils Relative: 0 %
Eosinophils Absolute: 0 10*3/uL (ref 0.0–0.5)
Eosinophils Relative: 0 %
HCT: 41.1 % (ref 39.0–52.0)
Hemoglobin: 13.7 g/dL (ref 13.0–17.0)
Immature Granulocytes: 1 %
Lymphocytes Relative: 2 %
Lymphs Abs: 0.4 10*3/uL — ABNORMAL LOW (ref 0.7–4.0)
MCH: 30.2 pg (ref 26.0–34.0)
MCHC: 33.3 g/dL (ref 30.0–36.0)
MCV: 90.5 fL (ref 80.0–100.0)
Monocytes Absolute: 1.1 10*3/uL — ABNORMAL HIGH (ref 0.1–1.0)
Monocytes Relative: 5 %
Neutro Abs: 20.7 10*3/uL — ABNORMAL HIGH (ref 1.7–7.7)
Neutrophils Relative %: 92 %
Platelets: 223 10*3/uL (ref 150–400)
RBC: 4.54 MIL/uL (ref 4.22–5.81)
RDW: 14.7 % (ref 11.5–15.5)
WBC: 22.4 10*3/uL — ABNORMAL HIGH (ref 4.0–10.5)
nRBC: 0 % (ref 0.0–0.2)

## 2023-06-25 LAB — TROPONIN T, HIGH SENSITIVITY
Troponin T High Sensitivity: 22 ng/L — ABNORMAL HIGH (ref <19)
Troponin T High Sensitivity: 19 ng/L — ABNORMAL HIGH (ref <19)

## 2023-06-25 LAB — LACTIC ACID, PLASMA
Lactic Acid, Venous: 1.9 mmol/L (ref 0.5–1.9)
Lactic Acid, Venous: 2 mmol/L (ref 0.5–1.9)

## 2023-06-25 LAB — D-DIMER, QUANTITATIVE: D-Dimer, Quant: 0.58 ug{FEU}/mL — ABNORMAL HIGH (ref 0.00–0.50)

## 2023-06-25 LAB — PRO BRAIN NATRIURETIC PEPTIDE: Pro Brain Natriuretic Peptide: 633 pg/mL — ABNORMAL HIGH (ref <300.0)

## 2023-06-25 MED ORDER — IPRATROPIUM-ALBUTEROL 0.5-2.5 (3) MG/3ML IN SOLN: 3.0000 mL | Freq: Four times a day (QID) | RESPIRATORY_TRACT | Status: DC | PRN

## 2023-06-25 MED ORDER — BUDESON-GLYCOPYRROL-FORMOTEROL 160-9-4.8 MCG/ACT IN AERO
2.0000 | INHALATION_SPRAY | Freq: Two times a day (BID) | RESPIRATORY_TRACT | Status: DC
  Administered 2023-06-26 – 2023-06-29 (×6): 2 via RESPIRATORY_TRACT
  Filled 2023-06-25: qty 5.9

## 2023-06-25 MED ORDER — GABAPENTIN 100 MG PO CAPS
300.0000 mg | ORAL_CAPSULE | Freq: Four times a day (QID) | ORAL | Status: DC
  Administered 2023-06-25 – 2023-06-29 (×14): 300 mg via ORAL
  Filled 2023-06-25 (×14): qty 3

## 2023-06-25 MED ORDER — SODIUM CHLORIDE 0.9 % IV BOLUS
500.0000 mL | Freq: Once | INTRAVENOUS | Status: AC
Start: 2023-06-25 — End: 2023-06-25
  Administered 2023-06-25: 500 mL via INTRAVENOUS

## 2023-06-25 MED ORDER — ONDANSETRON HCL 4 MG/2ML IJ SOLN
4.0000 mg | Freq: Four times a day (QID) | INTRAMUSCULAR | Status: DC | PRN
  Administered 2023-06-27: 4 mg via INTRAVENOUS
  Filled 2023-06-25: qty 2

## 2023-06-25 MED ORDER — METOPROLOL SUCCINATE ER 50 MG PO TB24
100.0000 mg | ORAL_TABLET | Freq: Every day | ORAL | Status: DC
  Administered 2023-06-25 – 2023-06-28 (×4): 100 mg via ORAL
  Filled 2023-06-25 (×4): qty 2

## 2023-06-25 MED ORDER — ROSUVASTATIN CALCIUM 20 MG PO TABS
20.0000 mg | ORAL_TABLET | Freq: Every day | ORAL | Status: DC
  Administered 2023-06-25 – 2023-06-28 (×4): 20 mg via ORAL
  Filled 2023-06-25 (×4): qty 1

## 2023-06-25 MED ORDER — BISACODYL 5 MG PO TBEC: 5.0000 mg | DELAYED_RELEASE_TABLET | Freq: Every day | ORAL | Status: DC | PRN

## 2023-06-25 MED ORDER — GUAIFENESIN ER 600 MG PO TB12
600.0000 mg | ORAL_TABLET | Freq: Two times a day (BID) | ORAL | Status: DC
  Administered 2023-06-25 – 2023-06-29 (×8): 600 mg via ORAL
  Filled 2023-06-25 (×8): qty 1

## 2023-06-25 MED ORDER — SODIUM CHLORIDE 0.9 % IV SOLN
500.0000 mg | INTRAVENOUS | Status: DC
  Administered 2023-06-26: 500 mg via INTRAVENOUS
  Filled 2023-06-25 (×2): qty 5

## 2023-06-25 MED ORDER — OXYCODONE HCL 5 MG PO TABS
10.0000 mg | ORAL_TABLET | ORAL | Status: DC
  Administered 2023-06-25 – 2023-06-29 (×22): 10 mg via ORAL
  Filled 2023-06-25 (×22): qty 2

## 2023-06-25 MED ORDER — SODIUM CHLORIDE 0.9 % IV SOLN
2.0000 g | Freq: Once | INTRAVENOUS | Status: AC
  Administered 2023-06-25: 2 g via INTRAVENOUS
  Filled 2023-06-25: qty 12.5

## 2023-06-25 MED ORDER — FINASTERIDE 5 MG PO TABS
5.0000 mg | ORAL_TABLET | Freq: Every day | ORAL | Status: DC
  Administered 2023-06-26 – 2023-06-29 (×4): 5 mg via ORAL
  Filled 2023-06-25 (×4): qty 1

## 2023-06-25 MED ORDER — SODIUM CHLORIDE 0.9 % IV SOLN
500.0000 mg | Freq: Once | INTRAVENOUS | Status: AC
  Administered 2023-06-25: 500 mg via INTRAVENOUS
  Filled 2023-06-25: qty 5

## 2023-06-25 MED ORDER — ACETAMINOPHEN 650 MG RE SUPP
650.0000 mg | Freq: Four times a day (QID) | RECTAL | Status: DC | PRN
Start: 2023-06-25 — End: 2023-06-29

## 2023-06-25 MED ORDER — TAMSULOSIN HCL 0.4 MG PO CAPS
0.4000 mg | ORAL_CAPSULE | Freq: Two times a day (BID) | ORAL | Status: DC
  Administered 2023-06-25 – 2023-06-29 (×8): 0.4 mg via ORAL
  Filled 2023-06-25 (×8): qty 1

## 2023-06-25 MED ORDER — SODIUM CHLORIDE 0.9 % IV SOLN
2.0000 g | INTRAVENOUS | Status: DC
  Administered 2023-06-25 – 2023-06-28 (×4): 2 g via INTRAVENOUS
  Filled 2023-06-25 (×3): qty 20

## 2023-06-25 MED ORDER — ONDANSETRON HCL 4 MG PO TABS: 4.0000 mg | ORAL_TABLET | Freq: Four times a day (QID) | ORAL | Status: DC | PRN

## 2023-06-25 MED ORDER — OXYCODONE-ACETAMINOPHEN 5-325 MG PO TABS
1.0000 | ORAL_TABLET | Freq: Once | ORAL | Status: AC
  Administered 2023-06-25: 1 via ORAL
  Filled 2023-06-25: qty 1

## 2023-06-25 MED ORDER — TIMOLOL MALEATE 0.5 % OP SOLN
1.0000 [drp] | Freq: Every day | OPHTHALMIC | Status: DC
  Administered 2023-06-25 – 2023-06-28 (×4): 1 [drp] via OPHTHALMIC
  Filled 2023-06-25: qty 5

## 2023-06-25 MED ORDER — ENOXAPARIN SODIUM 40 MG/0.4ML IJ SOSY
40.0000 mg | PREFILLED_SYRINGE | INTRAMUSCULAR | Status: DC
  Administered 2023-06-26 – 2023-06-29 (×4): 40 mg via SUBCUTANEOUS
  Filled 2023-06-25 (×4): qty 0.4

## 2023-06-25 MED ORDER — MONTELUKAST SODIUM 10 MG PO TABS
10.0000 mg | ORAL_TABLET | Freq: Every day | ORAL | Status: DC
  Administered 2023-06-25 – 2023-06-28 (×4): 10 mg via ORAL
  Filled 2023-06-25 (×4): qty 1

## 2023-06-25 MED ORDER — IPRATROPIUM-ALBUTEROL 20-100 MCG/ACT IN AERS: 1.0000 | INHALATION_SPRAY | Freq: Four times a day (QID) | RESPIRATORY_TRACT | Status: DC | PRN

## 2023-06-25 MED ORDER — SENNOSIDES-DOCUSATE SODIUM 8.6-50 MG PO TABS: 1.0000 | ORAL_TABLET | Freq: Every evening | ORAL | Status: DC | PRN

## 2023-06-25 MED ORDER — PANTOPRAZOLE SODIUM 40 MG PO TBEC
40.0000 mg | DELAYED_RELEASE_TABLET | Freq: Every day | ORAL | Status: DC
  Administered 2023-06-26 – 2023-06-29 (×4): 40 mg via ORAL
  Filled 2023-06-25 (×4): qty 1

## 2023-06-25 MED ORDER — ACETAMINOPHEN 325 MG PO TABS
650.0000 mg | ORAL_TABLET | Freq: Four times a day (QID) | ORAL | Status: DC | PRN
Start: 2023-06-25 — End: 2023-06-29
  Administered 2023-06-26 – 2023-06-28 (×2): 650 mg via ORAL
  Filled 2023-06-25 (×2): qty 2

## 2023-06-25 NOTE — ED Provider Notes (Signed)
 [place] EMERGENCY DEPARTMENT AT [facility] Provider Note  CSN: [id number] Arrival date & time: 06/25/23 1649  Chief Complaint(s) Shortness of Breath  HPI Cody Tate is a 73 y.o. male history of CHF, COPD, hypertension presenting to the emergency department with shortness of breath.  Patient reports that he has been fatigued over the past few days, associated mild shortness of breath.  Worsens with exertion.  Has home PT who saw him today, checked his vitals noted he was hypoxic to the 80s.  Does not use home oxygen.  Has had some nonproductive cough lately.  Also had some subjective fevers and chills today but when temperature was checked by wife was normal.  No chest pain, abdominal pain.  Reports some generalized weakness and lightheadedness but no syncope.  Has chronic leg swelling which seems about the same as normal, left greater than right.  No recent travel or surgeries was hospitalized last month for pneumonia.   Past Medical History Past Medical History:  Diagnosis Date   Acute heart failure with preserved ejection fraction (HCC) 11/12/2022   Arthritis    Back pain    Radiates down both legs   BPH (benign prostatic hyperplasia)    Cancer (HCC) 2022   prostate cancer   COPD (chronic obstructive pulmonary disease) (HCC)    Dyspnea    Enlarged prostate    GERD (gastroesophageal reflux disease)    Glaucoma    H/O hiatal hernia    H/O wheezing    occ inhaler usage   Heart murmur    Hypertension    Lumbar foraminal stenosis    Macular degeneration    Meningioma (HCC) 2014   Treated with radiation   Neuromuscular disorder (HCC)    Neuropathy bilateral legs   PONV (postoperative nausea and vomiting)    Pulmonary nodules    resolved on 2019 follow up CT   S/P insertion of spinal cord stimulator    Patient Active Problem List   Diagnosis Date Noted   Community acquired bacterial pneumonia 06/25/2023   Opioid-induced constipation 05/26/2023   Empyema  (HCC) 05/24/2023   Parapneumonic effusion 05/23/2023   Acute hyponatremia 05/23/2023   Hypokalemia 05/23/2023   Sepsis with acute organ dysfunction without septic shock (HCC) 05/22/2023   CAP (community acquired pneumonia) 05/02/2023   Hemoptysis 05/02/2023   Mild mitral regurgitation 10/30/2022   Aortic valve stenosis 10/30/2022   COPD (chronic obstructive pulmonary disease) (HCC) 01/11/2022   GERD (gastroesophageal reflux disease) 01/11/2022   Hypercholesterolemia 01/11/2022   Essential hypertension 01/11/2022   Obesity, Class II, BMI 35-39.9 01/11/2022   Other chronic pain 01/11/2022   Pulmonary nodule 01/11/2022   Lumbar foraminal stenosis 01/11/2022   Aortic ectasia (HCC) 01/11/2022   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   Malignant neoplasm of prostate (HCC) 07/12/2020   S/P insertion of spinal cord stimulator 01/16/2018   Back pain with history of spinal surgery 09/09/2017   Inflammation of sacroiliac joint (HCC) 09/20/2016   Opioid dependence (HCC) 01/10/2016   Swelling of lower limb 03/25/2014   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 Authorizing Provider  AFRIN 12 HOUR 0.05 % nasal spray Place 1 spray into both nostrils 2 (two) times daily as needed for congestion.    [provider]  Ascorbic Acid  (VITAMIN C ) 1000 MG tablet Take 1,000 mg by mouth daily. 12/31/18   [provider]  aspirin  EC 81 MG tablet Take 1 tablet  (81 mg total) by mouth daily. Swallow whole. Patient not taking: Reported on 05/22/2023 03/21/23   Williams, Evan, PA-C  budesonide -glycopyrrolate -formoterol  (BREZTRI  AEROSPHERE) 160-9-4.8 MCG/ACT AERO inhaler Inhale 2 puffs into the lungs in the morning and at bedtime. 05/28/23 06/27/23  Laurence Locus, DO  celecoxib  (CELEBREX ) 200 MG capsule Take 1 capsule (200 mg total) by mouth 2 (two) times daily. 05/28/23 08/26/23  Laurence Locus, DO  chlorhexidine  (HIBICLENS ) 4 % external liquid Apply 1 Application topically daily as needed (boils).    [provider]  Cholecalciferol  (VITAMIN D3) 50 MCG (2000 UT) capsule Take 4,000 Units by mouth daily. 12/22/18   [provider]  diphenhydrAMINE  (BENADRYL  ALLERGY ) 25 MG tablet Take 25 mg by mouth at bedtime.    [provider]  empagliflozin  (JARDIANCE ) 10 MG TABS tablet Take 1 tablet (10 mg total) by mouth daily. 05/28/23 08/26/23  Laurence Locus, DO  finasteride  (PROSCAR ) 5 MG tablet Take 5 mg by mouth daily. 05/13/20   [provider]  fluticasone  (FLONASE ) 50 MCG/ACT nasal spray Place 1 spray into both nostrils 2 (two) times daily.    [provider]  furosemide  (LASIX ) 20 MG tablet Take 1 tablet (20 mg total) by mouth 2 (two) times daily for 7 days, THEN 1 tablet (20 mg total) daily for 21 days. 05/28/23 06/25/23  Laurence Locus, DO  gabapentin  (NEURONTIN ) 300 MG capsule Take 1 capsule (300 mg total) by mouth 4 (four) times daily. 05/28/23 08/26/23  Laurence Locus, DO  Guaifenesin  (MUCINEX  MAXIMUM STRENGTH) 1200 MG TB12 Take 1,200 mg by mouth 2 (two) times daily.    [provider]  Ipratropium-Albuterol  (COMBIVENT  RESPIMAT) 20-100 MCG/ACT AERS respimat Inhale 1 puff into the lungs every 6 (six) hours as needed for wheezing or shortness of breath. 08/30/22   Hunsucker, Donnice SAUNDERS, MD  Lutein-Zeaxanthin 25-5 MG CAPS Take 1 Dose by mouth daily.    [provider]  magic mouthwash w/lidocaine  SOLN Take 15 mLs by mouth 4 (four)  times daily as needed for mouth pain. contains equal amounts of Maalox Extra Strength, nystatin, diphenhydramine  and lidocaine . 05/28/23   Laurence Locus, DO  magnesium  hydroxide (MILK OF MAGNESIA) 400 MG/5ML suspension Take 15 mLs by mouth at bedtime.    [provider]  metoprolol  succinate (TOPROL -XL) 100 MG 24 hr tablet Take 1 tablet (100 mg total) by mouth at bedtime. Take with or immediately following a meal. 05/28/23 08/26/23  Laurence Locus, DO  montelukast  (SINGULAIR ) 10 MG tablet TAKE 1 TABLET BY MOUTH AT  BEDTIME 12/21/22   Hunsucker, Donnice SAUNDERS, MD  Multiple Vitamin (MULTIVITAMIN WITH MINERALS) TABS tablet Take 1 tablet by mouth daily.    [provider]  omeprazole  (PRILOSEC) 40 MG capsule Take 1 capsule (40 mg total) by mouth daily before breakfast. 09/20/20   Hunsucker, Donnice SAUNDERS, MD  Oxycodone  HCl 10 MG TABS Take 10 mg by mouth every 4 (four) hours. 08/13/19   [provider]  Propylene Glycol, PF, (SYSTANE COMPLETE PF) 0.6 % SOLN Place 1 drop into both eyes 3 (three) times daily as needed (  dry eyes).    [provider]  rosuvastatin  (CRESTOR ) 20 MG tablet Take 1 tablet (20 mg total) by mouth at bedtime. 05/28/23 08/26/23  Laurence Locus, DO  Simethicone  125 MG CAPS Take 250 mg by mouth 4 (four) times daily as needed (for gas).    [provider]  sodium chloride  (OCEAN) 0.65 % SOLN nasal spray Place 1 spray into both nostrils 2 (two) times daily.    [provider]  Tamsulosin  HCl (FLOMAX ) 0.4 MG CAPS Take 0.4 mg by mouth 2 (two) times daily.    [provider]  telmisartan  (MICARDIS ) 80 MG tablet Take 0.5 tablets (40 mg total) by mouth 2 (two) times daily. 05/28/23 08/26/23  Laurence Locus, DO  timolol (TIMOPTIC) 0.5 % ophthalmic solution Place 1 drop into both eyes at bedtime. 10/12/22   [provider]                                                                                                                                    Past  Surgical History Past Surgical History:  Procedure Laterality Date   ABDOMINAL EXPOSURE N/A 03/16/2022   Procedure: ABDOMINAL EXPOSURE;  Surgeon: Gretta Lonni PARAS, MD;  Location: [facility] OR;  Service: Vascular;  Laterality: N/A;   ANTERIOR LUMBAR FUSION N/A 03/16/2022   Procedure: Anterior Lumbar Interbody Fusion - Lumbar Five-Sacral One with cage;  Surgeon: Louis Shove, MD;  Location: [facility] OR;  Service: Neurosurgery;  Laterality: N/A;   BACK SURGERY  06/08/2006   lam x3 cerv x 1   CERVICAL FUSION     COLONOSCOPY  2020   CYSTOSCOPY     73 yrs old   CYSTOSCOPY WITH URETHRAL DILATATION N/A 09/21/2021   Procedure: CYSTOSCOPY WITH BALLOON DILATION and OPTILUME BALLOON DILATION OF  URETHRAL DILATATION;  Surgeon: Elisabeth Valli BIRCH, MD;  Location: [facility] ORS;  Service: Urology;  Laterality: N/A;   EYE SURGERY     bil   GOLD SEED IMPLANT N/A 09/06/2020   Procedure: GOLD SEED IMPLANT/ PLACEMENT OF FIDUCIAL MARKERS;  Surgeon: Elisabeth Valli BIRCH, MD;  Location: [facility] ORS;  Service: Urology;  Laterality: N/A;   KNEE ARTHROSCOPY  01/02/2008   lft   KNEE ARTHROSCOPY Left 07/23/2012   Procedure: LEFT MEDIAL MENISCAL DEBRIDEMENT AND CHONDROPLASTY;  Surgeon: Dempsey LULLA Moan, MD;  Location: [facility] ORS;  Service: Orthopedics;  Laterality: Left;   LUMBAR FUSION  02/22/2014   DR POOL   PILONIDAL CYST EXCISION     SINUS EXPLORATION     SPACE OAR INSTILLATION N/A 09/06/2020   Procedure: SPACE OAR INSTILLATION;  Surgeon: Elisabeth Valli BIRCH, MD;  Location: [facility] ORS;  Service: Urology;  Laterality: N/A;   SPINAL CORD STIMULATOR INSERTION N/A 01/16/2018   Procedure: LUMBAR SPINAL CORD STIMULATOR INSERTION;  Surgeon: Joshua Alm RAMAN, MD;  Location[facility];  Service: Neurosurgery;  Laterality: N/A;  LUMBAR SPINAL CORD STIMULATOR INSERTION   TRANSURETHRAL RESECTION OF  PROSTATE N/A 09/06/2020   Procedure: TRANSURETHRAL RESECTION OF THE PROSTATE (TURP);  Surgeon: Elisabeth Valli BIRCH, MD;  Location: [facility] ORS;  Service: Urology;  Laterality:  N/A;  2 HRS   WRIST GANGLION EXCISION     rt   WRIST SURGERY  01/02/2007   cyst lft   Family History Family History  Problem Relation Age of Onset   Hypertension Mother    Bone cancer Mother    Stroke Father 63   COPD Maternal Uncle     Social History Social History   Tobacco Use   Smoking status: Former    Current packs/day: 0.00    Average packs/day: 1.5 packs/day for 35.0 years (52.5 ttl pk-yrs)    Types: Cigarettes    Start date: 05/03/1965    Quit date: 05/03/2000    Years since quitting: 23.1   Smokeless tobacco: Never   Tobacco comments:    quit alcohol  73  Vaping Use   Vaping status: Never Used  Substance Use Topics   Alcohol  use: Not Currently    Comment: hx of years ago   Drug use: No   Allergies Amlodipine besylate, Aspirin , Other, Tape, and Wound dressing adhesive  Review of Systems Review of Systems  All other systems reviewed and are negative.   Physical Exam Vital Signs  I have reviewed the triage vital signs BP (!) 114/51   Pulse 86   Temp 98 F (36.7 C) (Oral)   Resp 19   SpO2 95%  Physical Exam Vitals and nursing note reviewed.  Constitutional:      General: He is not in acute distress.    Appearance: Normal appearance.  HENT:     Mouth/Throat:     Mouth: Mucous membranes are moist.   Eyes:     Conjunctiva/sclera: Conjunctivae normal.   Neck:     Vascular: No JVD.   Cardiovascular:     Rate and Rhythm: Normal rate and regular rhythm.  Pulmonary:     Effort: Pulmonary effort is normal. No respiratory distress.     Breath sounds: Examination of the right-lower field reveals rales. Examination of the left-lower field reveals rales. Rales present. No wheezing.  Abdominal:     General: Abdomen is flat.     Palpations: Abdomen is soft.     Tenderness: There is no abdominal tenderness.   Musculoskeletal:     Right lower leg: Edema present.     Left lower leg: Edema present.     Comments: 1+ RLE edema, 3+ LLE edema. Per pt and  wife chronic and actually improved    Skin:    General: Skin is warm and dry.     Capillary Refill: Capillary refill takes less than 2 seconds.   Neurological:     Mental Status: He is alert and oriented to person, place, and time. Mental status is at baseline.   Psychiatric:        Mood and Affect: Mood normal.        Behavior: Behavior normal.     ED Results and Treatments Labs (all labs ordered are listed, but only abnormal results are displayed) Labs Reviewed  COMPREHENSIVE METABOLIC PANEL WITH GFR - Abnormal; Notable for the following components:      Result Value   Sodium 132 (*)    Chloride 97 (*)    BUN 29 (*)    Creatinine, Ser 1.29 (*)    Alkaline Phosphatase 132 (*)    GFR, Estimated 59 (*)  All other components within normal limits  CBC WITH DIFFERENTIAL/PLATELET - Abnormal; Notable for the following components:   WBC 22.4 (*)    Neutro Abs 20.7 (*)    Lymphs Abs 0.4 (*)    Monocytes Absolute 1.1 (*)    Abs Immature Granulocytes 0.12 (*)    All other components within normal limits  PRO BRAIN NATRIURETIC PEPTIDE - Abnormal; Notable for the following components:   Pro Brain Natriuretic Peptide 633.0 (*)    All other components within normal limits  D-DIMER, QUANTITATIVE - Abnormal; Notable for the following components:   D-Dimer, Quant 0.58 (*)    All other components within normal limits  TROPONIN T, HIGH SENSITIVITY - Abnormal; Notable for the following components:   Troponin T High Sensitivity 22 (*)    All other components within normal limits  CULTURE, BLOOD (ROUTINE X 2)  CULTURE, BLOOD (ROUTINE X 2)  LACTIC ACID, PLASMA  LACTIC ACID, PLASMA  TROPONIN T, HIGH SENSITIVITY                                                                                                                          Radiology DG Chest Portable 1 View Result Date: 06/25/2023 CLINICAL DATA:  Shortness of breath EXAM: PORTABLE CHEST 1 VIEW COMPARISON:  Chest x-ray  06/05/2023.  CT of the chest 06/24/2023. FINDINGS: Focal opacity in the lateral left lower lung with pleural thickening has minimally decreased. There are new airspace opacities in the bilateral lung bases, right greater than left. Stable small left pleural effusion. No pneumothorax. Cardiomediastinal silhouette appears stable. Osseous structures are unchanged. IMPRESSION: 1. New airspace opacities in the bilateral lung bases, right greater than left, concerning for multifocal pneumonia. 2. Focal opacity in the lateral left lower lung with pleural thickening has minimally decreased. 3. Stable small left pleural effusion. Electronically Signed   By: Greig Pique M.D.   On: 06/25/2023 17:48    Pertinent labs & imaging results that were available during my care of the patient were reviewed by me and considered in my medical decision making (see MDM for details).  Medications Ordered in ED Medications  ceFEPIme (MAXIPIME) 2 g in sodium chloride  0.9 % 100 mL IVPB (2 g Intravenous New Bag/Given 06/25/23 1837)  azithromycin (ZITHROMAX) 500 mg in sodium chloride  0.9 % 250 mL IVPB (has no administration in time range)  sodium chloride  0.9 % bolus 500 mL (500 mLs Intravenous New Bag/Given 06/25/23 1838)  Procedures .Critical Care  Performed by: Francesca Elsie CROME, MD Authorized by: Francesca Elsie CROME, MD   Critical care provider statement:    Critical care time (minutes):  30   Critical care was necessary to treat or prevent imminent or life-threatening deterioration of the following conditions:  Respiratory failure   Critical care was time spent personally by me on the following activities:  Development of treatment plan with patient or surrogate, discussions with consultants, evaluation of patient's response to treatment, examination of patient, ordering and review of  laboratory studies, ordering and review of radiographic studies, ordering and performing treatments and interventions, pulse oximetry, re-evaluation of patient's condition and review of old charts   Care discussed with: admitting provider and accepting provider at another facility     (including critical care time)  Medical Decision Making / ED Course   MDM:  73 year old presenting to the emergency department with shortness of breath.  Patient overall well-appearing, does have some basilar crackles.  Is hypoxic.  Placed on oxygen.  Differential includes pneumonia, consider pulmonary embolism given recent hospitalization will check D-dimer, consider CHF although no obvious JVD will check BNP and x-ray.  No wheezing to suggest COPD exacerbation.  Check labs to evaluate for anemia or other process.  Given hypoxia anticipate patient will likely need admission.  Despite hypoxia he does overall appear well.  Clinical Course as of 06/25/23 1901  Tue Jun 25, 2023  1825 D-Dimer, Quant(!): 0.58 Age adjusted d-dimer is negative. [WS]  1859 Workup shows leukocytosis, mild AKI, normal lactic acid.  Chest x-ray concerning for pneumonia.  Given symptoms, suspect this is the cause of the patient's hypoxia.  Has been covered with cefepime and azithromycin.  Blood cultures were obtained.  His BNP is elevated and he does have some leg swelling, but seems more dehydrated side with AKI and lack of JVD.  His leg swelling is also chronic.  Discussed with Dr. Dena who has accepted patient for admission [WS]    Clinical Course User Index [WS] Francesca Elsie CROME, MD     Additional history obtained: -Additional history obtained from family and spouse -External records from outside source obtained and reviewed including: Chart review including previous notes, labs, imaging, consultation notes including prior admission for pneumonia   Lab Tests: -I ordered, reviewed, and interpreted labs.   The pertinent  results include:   Labs Reviewed  COMPREHENSIVE METABOLIC PANEL WITH GFR - Abnormal; Notable for the following components:      Result Value   Sodium 132 (*)    Chloride 97 (*)    BUN 29 (*)    Creatinine, Ser 1.29 (*)    Alkaline Phosphatase 132 (*)    GFR, Estimated 59 (*)    All other components within normal limits  CBC WITH DIFFERENTIAL/PLATELET - Abnormal; Notable for the following components:   WBC 22.4 (*)    Neutro Abs 20.7 (*)    Lymphs Abs 0.4 (*)    Monocytes Absolute 1.1 (*)    Abs Immature Granulocytes 0.12 (*)    All other components within normal limits  PRO BRAIN NATRIURETIC PEPTIDE - Abnormal; Notable for the following components:   Pro Brain Natriuretic Peptide 633.0 (*)    All other components within normal limits  D-DIMER, QUANTITATIVE - Abnormal; Notable for the following components:   D-Dimer, Quant 0.58 (*)    All other components within normal limits  TROPONIN T, HIGH SENSITIVITY - Abnormal; Notable for the following components:   Troponin T  High Sensitivity 22 (*)    All other components within normal limits  CULTURE, BLOOD (ROUTINE X 2)  CULTURE, BLOOD (ROUTINE X 2)  LACTIC ACID, PLASMA  LACTIC ACID, PLASMA  TROPONIN T, HIGH SENSITIVITY    Notable for normal lactic acid. leukocytosis  EKG   EKG Interpretation Date/Time:  Tuesday June 25 2023 17:01:55 EDT Ventricular Rate:  97 PR Interval:  163 QRS Duration:  144 QT Interval:  358 QTC Calculation: 455 R Axis:   65  Text Interpretation: Sinus rhythm Atrial premature complex Right bundle branch block Confirmed by Francesca Fallow ([id number]) on 06/25/2023 5:03:01 PM         Imaging Studies ordered: I ordered imaging studies including CXR On my interpretation imaging demonstrates pna I independently visualized and interpreted imaging. I agree with the radiologist interpretation   Medicines ordered and prescription drug management: Meds ordered this encounter  Medications   ceFEPIme  (MAXIPIME) 2 g in sodium chloride  0.9 % 100 mL IVPB    Antibiotic Indication::   HCAP   azithromycin (ZITHROMAX) 500 mg in sodium chloride  0.9 % 250 mL IVPB   sodium chloride  0.9 % bolus 500 mL    -I have reviewed the patients home medicines and have made adjustments as needed   Consultations Obtained: I requested consultation with the hospitalist,  and discussed lab and imaging findings as well as pertinent plan - they recommend: admission   Cardiac Monitoring: The patient was maintained on a cardiac monitor.  I personally viewed and interpreted the cardiac monitored which showed an underlying rhythm of: NSR  Social Determinants of Health:  Diagnosis or treatment significantly limited by social determinants of health: former smoker and obesity   Reevaluation: After the interventions noted above, I reevaluated the patient and found that their symptoms have improved  Co morbidities that complicate the patient evaluation  Past Medical History:  Diagnosis Date   Acute heart failure with preserved ejection fraction (HCC) 11/12/2022   Arthritis    Back pain    Radiates down both legs   BPH (benign prostatic hyperplasia)    Cancer (HCC) 2022   prostate cancer   COPD (chronic obstructive pulmonary disease) (HCC)    Dyspnea    Enlarged prostate    GERD (gastroesophageal reflux disease)    Glaucoma    H/O hiatal hernia    H/O wheezing    occ inhaler usage   Heart murmur    Hypertension    Lumbar foraminal stenosis    Macular degeneration    Meningioma (HCC) 2014   Treated with radiation   Neuromuscular disorder (HCC)    Neuropathy bilateral legs   PONV (postoperative nausea and vomiting)    Pulmonary nodules    resolved on 2019 follow up CT   S/P insertion of spinal cord stimulator       Dispostion: Disposition decision including need for hospitalization was considered, and patient admitted to the hospital.    Final Clinical Impression(s) / ED Diagnoses Final  diagnoses:  Multifocal pneumonia  Acute hypoxic respiratory failure (HCC)     This chart was dictated using voice recognition software.  Despite best efforts to proofread,  errors can occur which can change the documentation meaning.    Francesca Fallow CROME, MD 06/25/23 1901

## 2023-06-25 NOTE — H&P (Signed)
 History and Physical    Cody Tate FMW:[id number] DOB: 06-Nov-1950 DOA: 06/25/2023  PCP: Charlott Dorn LABOR, MD  Patient coming from: Home  I have personally briefly reviewed patient's old medical records in [facility]  Chief Complaint: Shortness of breath, hypoxia  HPI: Cody Tate is a 73 y.o. male with medical history significant for COPD, chronic HFpEF, HTN, HLD, BPH, chronic bilateral lower extremity edema (left > right) who presented to the ED for evaluation of shortness of breath and hypoxia.  Patient was recently admitted 5/21-5/27 after failure of outpatient management of pneumonia.  He was admitted with sepsis and acute hypoxic respiratory failure due to community-acquired pneumonia with parapneumonic left pleural effusion.  Initially was treated with vancomycin  and Zosyn .  Had left pigtail chest tube inserted and managed by PCCM with several doses of intrapleural lytics.  Tracheal aspirate culture showed normal flora.  Left pleural effusion culture grew moderate strep intermedius.  Patient was discharged to home to complete 8 additional days of Augmentin .  He did not require supplemental oxygen at time of discharge.  Patient had outpatient noncontrast CT chest performed yesterday 6/23.  Formal read is pending however on review appears to show multifocal bilateral infiltrates with improvement in previously seen masslike left upper lobe consolidation.  Patient reported feeling fatigued for the last few days associated with shortness of breath.  Shortness of breath has actually been going on for about 2 weeks now and progressively worsening.  Dyspnea is worse with exertion. He has had intermittent cough productive of yellow sputum.  He reports experiencing diaphoresis but no chills.    He reports chronic swelling to both lower extremities, left greater than right which is actually improved from recent baseline.  He has home health PT who saw him today and noted that he  was hypoxic to 87-91% on room air at home.  [facility] ED Course  Labs/Imaging on admission: I have personally reviewed following labs and imaging studies.  Initial vitals showed BP 106/45, pulse 92, RR 19, temp 98.0 F, SpO2 84% on room air.  Patient placed on 2 L O2 via [place] with improvement >92%.  Labs showed WBC 22.4, hemoglobin 13.7, platelets 233, sodium 132, potassium 4.3, bicarb 23, BUN 29, creatinine 1.29, serum glucose 98, proBNP 633, troponin T 22 > 19, D-dimer 0.58, lactic acid 1.9.  Portable chest x-ray showed new airspace opacities in bilateral lung bases, right greater than left concerning for multifocal pneumonia.  Focal opacity in the lateral left lower lung with pleural thickening has minimally decreased.  Stable small left pleural effusion noted.  Patient was given IV cefepime and azithromycin, 500 cc NS.  The hospitalist service was consulted to admit.  Review of Systems: All systems reviewed and are negative except as documented in history of present illness above.   Past Medical History:  Diagnosis Date   Acute heart failure with preserved ejection fraction (HCC) 11/12/2022   Arthritis    Back pain    Radiates down both legs   BPH (benign prostatic hyperplasia)    Cancer (HCC) 2022   prostate cancer   COPD (chronic obstructive pulmonary disease) (HCC)    Dyspnea    Enlarged prostate    GERD (gastroesophageal reflux disease)    Glaucoma    H/O hiatal hernia    H/O wheezing    occ inhaler usage   Heart murmur    Hypertension    Lumbar foraminal stenosis    Macular degeneration  Meningioma (HCC) 2014   Treated with radiation   Neuromuscular disorder (HCC)    Neuropathy bilateral legs   PONV (postoperative nausea and vomiting)    Pulmonary nodules    resolved on 2019 follow up CT   S/P insertion of spinal cord stimulator     Past Surgical History:  Procedure Laterality Date   ABDOMINAL EXPOSURE N/A 03/16/2022   Procedure: ABDOMINAL  EXPOSURE;  Surgeon: Gretta Lonni PARAS, MD;  Location: [facility] OR;  Service: Vascular;  Laterality: N/A;   ANTERIOR LUMBAR FUSION N/A 03/16/2022   Procedure: Anterior Lumbar Interbody Fusion - Lumbar Five-Sacral One with cage;  Surgeon: Louis Shove, MD;  Location: [facility] OR;  Service: Neurosurgery;  Laterality: N/A;   BACK SURGERY  06/08/2006   lam x3 cerv x 1   CERVICAL FUSION     COLONOSCOPY  2020   CYSTOSCOPY     73 yrs old   CYSTOSCOPY WITH URETHRAL DILATATION N/A 09/21/2021   Procedure: CYSTOSCOPY WITH BALLOON DILATION and OPTILUME BALLOON DILATION OF  URETHRAL DILATATION;  Surgeon: Elisabeth Valli BIRCH, MD;  Location: [facility] ORS;  Service: Urology;  Laterality: N/A;   EYE SURGERY     bil   GOLD SEED IMPLANT N/A 09/06/2020   Procedure: GOLD SEED IMPLANT/ PLACEMENT OF FIDUCIAL MARKERS;  Surgeon: Elisabeth Valli BIRCH, MD;  Location: [facility] ORS;  Service: Urology;  Laterality: N/A;   KNEE ARTHROSCOPY  01/02/2008   lft   KNEE ARTHROSCOPY Left 07/23/2012   Procedure: LEFT MEDIAL MENISCAL DEBRIDEMENT AND CHONDROPLASTY;  Surgeon: Dempsey LULLA Moan, MD;  Location: [facility] ORS;  Service: Orthopedics;  Laterality: Left;   LUMBAR FUSION  02/22/2014   DR POOL   PILONIDAL CYST EXCISION     SINUS EXPLORATION     SPACE OAR INSTILLATION N/A 09/06/2020   Procedure: SPACE OAR INSTILLATION;  Surgeon: Elisabeth Valli BIRCH, MD;  Location: [facility];  Service: Urology;  Laterality: N/A;   SPINAL CORD STIMULATOR INSERTION N/A 01/16/2018   Procedure: LUMBAR SPINAL CORD STIMULATOR INSERTION;  Surgeon: Joshua Alm RAMAN, MD;  Location: [facility] OR;  Service: Neurosurgery;  Laterality: N/A;  LUMBAR SPINAL CORD STIMULATOR INSERTION   TRANSURETHRAL RESECTION OF PROSTATE N/A 09/06/2020   Procedure: TRANSURETHRAL RESECTION OF THE PROSTATE (TURP);  Surgeon: Elisabeth Valli BIRCH, MD;  Location: [facility] ORS;  Service: Urology;  Laterality: N/A;  2 HRS   WRIST GANGLION EXCISION     rt   WRIST SURGERY  01/02/2007   cyst lft    Social History: Social History    Tobacco Use   Smoking status: Former    Current packs/day: 0.00    Average packs/day: 1.5 packs/day for 35.0 years (52.5 ttl pk-yrs)    Types: Cigarettes    Start date: 05/03/1965    Quit date: 05/03/2000    Years since quitting: 23.1   Smokeless tobacco: Never   Tobacco comments:    quit alcohol  73  Vaping Use   Vaping status: Never Used  Substance Use Topics   Alcohol  use: Not Currently    Comment: hx of years ago   Drug use: No    Allergies  Allergen Reactions   Amlodipine Besylate     Other Reaction(s): fatigue   Aspirin      Other Reaction(s): GI upset   Other Itching, Other (See Comments), Rash and Dermatitis    VICRYL Sutures   Tape Itching, Other (See Comments) and Rash    Occlusive tape and bandaids   Wound Dressing Adhesive Rash    Family  History  Problem Relation Age of Onset   Hypertension Mother    Bone cancer Mother    Stroke Father 74   COPD Maternal Uncle      Prior to Admission medications   Medication Sig Start Date End Date Taking? Authorizing Provider  AFRIN 12 HOUR 0.05 % nasal spray Place 1 spray into both nostrils 2 (two) times daily as needed for congestion.    [provider]  Ascorbic Acid  (VITAMIN C ) 1000 MG tablet Take 1,000 mg by mouth daily. 12/31/18   [provider]  aspirin  EC 81 MG tablet Take 1 tablet (81 mg total) by mouth daily. Swallow whole. Patient not taking: Reported on 05/22/2023 03/21/23   Williams, Evan, PA-C  budesonide -glycopyrrolate -formoterol  (BREZTRI  AEROSPHERE) 160-9-4.8 MCG/ACT AERO inhaler Inhale 2 puffs into the lungs in the morning and at bedtime. 05/28/23 06/27/23  Laurence Locus, DO  celecoxib  (CELEBREX ) 200 MG capsule Take 1 capsule (200 mg total) by mouth 2 (two) times daily. 05/28/23 08/26/23  Laurence Locus, DO  chlorhexidine  (HIBICLENS ) 4 % external liquid Apply 1 Application topically daily as needed (boils).    [provider]  Cholecalciferol  (VITAMIN D3) 50 MCG (2000 UT) capsule Take 4,000  Units by mouth daily. 12/22/18   [provider]  diphenhydrAMINE  (BENADRYL  ALLERGY ) 25 MG tablet Take 25 mg by mouth at bedtime.    [provider]  empagliflozin  (JARDIANCE ) 10 MG TABS tablet Take 1 tablet (10 mg total) by mouth daily. 05/28/23 08/26/23  Laurence Locus, DO  finasteride  (PROSCAR ) 5 MG tablet Take 5 mg by mouth daily. 05/13/20   [provider]  fluticasone  (FLONASE ) 50 MCG/ACT nasal spray Place 1 spray into both nostrils 2 (two) times daily.    [provider]  furosemide  (LASIX ) 20 MG tablet Take 1 tablet (20 mg total) by mouth 2 (two) times daily for 7 days, THEN 1 tablet (20 mg total) daily for 21 days. 05/28/23 06/25/23  Laurence Locus, DO  gabapentin  (NEURONTIN ) 300 MG capsule Take 1 capsule (300 mg total) by mouth 4 (four) times daily. 05/28/23 08/26/23  Laurence Locus, DO  Guaifenesin  (MUCINEX  MAXIMUM STRENGTH) 1200 MG TB12 Take 1,200 mg by mouth 2 (two) times daily.    [provider]  Ipratropium-Albuterol  (COMBIVENT  RESPIMAT) 20-100 MCG/ACT AERS respimat Inhale 1 puff into the lungs every 6 (six) hours as needed for wheezing or shortness of breath. 08/30/22   Hunsucker, Donnice SAUNDERS, MD  Lutein-Zeaxanthin 25-5 MG CAPS Take 1 Dose by mouth daily.    [provider]  magic mouthwash w/lidocaine  SOLN Take 15 mLs by mouth 4 (four) times daily as needed for mouth pain. contains equal amounts of Maalox Extra Strength, nystatin, diphenhydramine  and lidocaine . 05/28/23   Laurence Locus, DO  magnesium  hydroxide (MILK OF MAGNESIA) 400 MG/5ML suspension Take 15 mLs by mouth at bedtime.    [provider]  metoprolol  succinate (TOPROL -XL) 100 MG 24 hr tablet Take 1 tablet (100 mg total) by mouth at bedtime. Take with or immediately following a meal. 05/28/23 08/26/23  Laurence Locus, DO  montelukast  (SINGULAIR ) 10 MG tablet TAKE 1 TABLET BY MOUTH AT  BEDTIME 12/21/22   Hunsucker, Donnice SAUNDERS, MD  Multiple Vitamin (MULTIVITAMIN WITH MINERALS) TABS tablet Take 1  tablet by mouth daily.    [provider]  omeprazole  (PRILOSEC) 40 MG capsule Take 1 capsule (40 mg total) by mouth daily before breakfast. 09/20/20   Hunsucker, Donnice SAUNDERS, MD  Oxycodone  HCl 10 MG TABS Take 10 mg  by mouth every 4 (four) hours. 08/13/19   [provider]  Propylene Glycol, PF, (SYSTANE COMPLETE PF) 0.6 % SOLN Place 1 drop into both eyes 3 (three) times daily as needed (dry eyes).    [provider]  rosuvastatin  (CRESTOR ) 20 MG tablet Take 1 tablet (20 mg total) by mouth at bedtime. 05/28/23 08/26/23  Laurence Locus, DO  Simethicone  125 MG CAPS Take 250 mg by mouth 4 (four) times daily as needed (for gas).    [provider]  sodium chloride  (OCEAN) 0.65 % SOLN nasal spray Place 1 spray into both nostrils 2 (two) times daily.    [provider]  Tamsulosin  HCl (FLOMAX ) 0.4 MG CAPS Take 0.4 mg by mouth 2 (two) times daily.    [provider]  telmisartan  (MICARDIS ) 80 MG tablet Take 0.5 tablets (40 mg total) by mouth 2 (two) times daily. 05/28/23 08/26/23  Laurence Locus, DO  timolol (TIMOPTIC) 0.5 % ophthalmic solution Place 1 drop into both eyes at bedtime. 10/12/22   [provider]    Physical Exam: Vitals:   06/25/23 1730 06/25/23 1813 06/25/23 2041 06/25/23 2120  BP: (!) 114/51   133/60  Pulse: 89 86  82  Resp: 15 19  18   Temp:   98.1 F (36.7 C) 98 F (36.7 C)  TempSrc:   Oral Oral  SpO2: 95% 95%  95%   Constitutional: Resting in bed with head elevated, NAD, calm, comfortable Eyes: EOMI, lids and conjunctivae normal ENMT: Mucous membranes are moist. Posterior pharynx clear of any exudate or lesions.Normal dentition.  Neck: normal, supple, no masses. Respiratory: clear to auscultation bilaterally, no wheezing, no crackles. Normal respiratory effort while on 2 L O2 via [place]. No accessory muscle use.  Cardiovascular: Regular rate and rhythm, no murmurs / rubs / gallops.  Trace right and +1 left lower extremity edema,  chronic and stable per patient. Abdomen: no tenderness, no masses palpated. Musculoskeletal: no clubbing / cyanosis. No joint deformity upper and lower extremities. Good ROM, no contractures. Normal muscle tone.  Skin: no rashes, lesions, ulcers. No induration Neurologic: Sensation intact. Strength 5/5 in all 4.  Psychiatric: Normal judgment and insight. Alert and oriented x 3. Normal mood.   EKG: Personally reviewed. Sinus rhythm, rate 97, RBBB, PAC, similar to previous.  Assessment/Plan Principal Problem:   Acute hypoxic respiratory failure (HCC) Active Problems:   Essential hypertension   Multifocal pneumonia   AKI (acute kidney injury) (HCC)   BPH with obstruction/lower urinary tract symptoms   COPD (chronic obstructive pulmonary disease) (HCC)   Hypercholesterolemia   Other chronic pain   Cody Tate is a 73 y.o. male with medical history significant for COPD, chronic HFpEF, HTN, HLD, BPH who is admitted with acute hypoxic respiratory failure due to multifocal pneumonia.  Assessment and Plan: Acute hypoxic respiratory failure due to multifocal pneumonia: SpO2 87-91% at home with PT, down to 84% on arrival to ED while on room air.  Currently stable on 2 L O2 via .  CXR and outpatient chest CT 6/23 shows changes of multifocal pneumonia.  WBC 22.4 on admission otherwise not meeting sepsis criteria.  Received cefepime and azithromycin in the ED. - Continue azithromycin - Change cefepime to IV ceftriaxone  - Follow blood cultures - Obtain strep pneumoniae, Legionella urine antigens - Continue supplemental oxygen as needed - IS, FV, Mucinex   Acute kidney injury: Creatinine 1.29 on admission compared to previous 0.63 on 5/24.  Likely secondary to acute illness plus potentially  medication effect. - S/p 500 cc normal saline - Hold Jardiance , telmisartan , NSAIDs  COPD: Stable without wheezing on admission.  Continue Breztri , Singulair , and Combivent  as needed.  Chronic  HFpEF: Last EF 70-75%.  Patient has chronic bilateral lower extremity edema, left greater than right, which patient states is at baseline.  No evidence of decompensated CHF at time of admission.  Continue Toprol -XL.  Holding Jardiance  as above.  Hypertension: Continue Toprol -XL.  Holding telmisartan .  Hyperlipidemia: Continue rosuvastatin .  BPH/history of prostate cancer: Continue Proscar  and Flomax .  Monitor UOP.  Chronic pain syndrome: Continue home oxycodone  and gabapentin .    DVT prophylaxis: enoxaparin  (LOVENOX ) injection 40 mg Start: 06/26/23 1000 Code Status: Full code, confirmed with patient on admission Family Communication: Spouse at bedside Disposition Plan: From home and likely discharge to home pending clinical progress Consults called: None Severity of Illness: The appropriate patient status for this patient is INPATIENT. Inpatient status is judged to be reasonable and necessary in order to provide the required intensity of service to ensure the patient's safety. The patient's presenting symptoms, physical exam findings, and initial radiographic and laboratory data in the context of their chronic comorbidities is felt to place them at high risk for further clinical deterioration. Furthermore, it is not anticipated that the patient will be medically stable for discharge from the hospital within 2 midnights of admission.   * I certify that at the point of admission it is my clinical judgment that the patient will require inpatient hospital care spanning beyond 2 midnights from the point of admission due to high intensity of service, high risk for further deterioration and high frequency of surveillance required.DEWAINE Jorie Blanch MD [facility]  If 7PM-7AM, please contact night-coverage [email or web address]  06/25/2023, 10:25 PM

## 2023-06-25 NOTE — Plan of Care (Signed)
 Cody Tate is a 73 yo with hc of COPD, CAD, CHF who presented to [facility] with hypoxia SOB. Found to have CAP with O2 sats around 80. Accepted for transfer to [facility] for IV abx and resp care.

## 2023-06-25 NOTE — ED Notes (Signed)
 [facility] at bedside

## 2023-06-25 NOTE — ED Triage Notes (Signed)
 Sob  Started this AM Seem by home PT today Sp02 87-91%, normal 95%

## 2023-06-25 NOTE — Hospital Course (Addendum)
 73 y.o. male with medical history significant for COPD, chronic HFpEF, HTN, HLD, BPH, chronic bilateral lower extremity edema (left > right) and recent hospitalization in May for strep intermedius pneumonia complicated by parapneumonic effusion requiring temporary chest tube who presented to the ED for evaluation of shortness of breath and hypoxia.   Upon evaluation in the emergency department patient was found to be suffering from acute hypoxic respiratory failure.  Patient was found to have substantial leukocytosis of 22.4 with CT imaging of the chest revealing multifocal pneumonia.  Hospitalist group was then called to assess the patient for admission to the hospital.  Patient was treated with a course of intravenous ceftriaxone  and azithromycin .  Patient was found to be suffering from acute hypoxic respiratory failure secondary to recurrent multifocal pneumonia.  In the days that followed patient slowly clinically improved and was weaned off of supplemental oxygen while at rest.  Ambulatory oxygen assessment performed prior to discharge did revealed the patient did require 2 L of oxygen with exertion however.  Prior to discharge the patient's case was reviewed with Dr. Geronimo with pulmonology.  While the masslike consolidation in the left upper lobe seem to be improving CT imaging did reveal continued nodular infiltrates throughout the left lung fields.  Dr. Geronimo only felt that the patient would require an additional 2 weeks of oral antibiotic therapy and close continued follow-up in his clinic with consideration of a bronchoscopy in the coming weeks.  With patient clinically improving arrangements were made for the patient to go home with supplemental oxygen 2 L with exertion.  Arrangements were made for the patient to follow-up with Dr. Geronimo in 1 to 2 weeks in clinic and the patient was sent home with a 2-week course of Keflex and Flagyl.

## 2023-06-25 NOTE — ED Notes (Signed)
 RT note: Patient was placed on 2lpm [place] due to a low oxygen saturation of 83% on room air and having SOB an increased WOB. He is currently 94% on 2lpm [place] and tolerating well at this time with no distress

## 2023-06-26 DIAGNOSIS — J9601 Acute respiratory failure with hypoxia: Secondary | ICD-10-CM | POA: Diagnosis not present

## 2023-06-26 LAB — BASIC METABOLIC PANEL WITH GFR
Anion gap: 9 (ref 5–15)
BUN: 25 mg/dL — ABNORMAL HIGH (ref 8–23)
CO2: 25 mmol/L (ref 22–32)
Calcium: 8.5 mg/dL — ABNORMAL LOW (ref 8.9–10.3)
Chloride: 95 mmol/L — ABNORMAL LOW (ref 98–111)
Creatinine, Ser: 0.9 mg/dL (ref 0.61–1.24)
GFR, Estimated: >60 mL/min (ref >60)
Glucose, Bld: 91 mg/dL (ref 70–99)
Potassium: 4 mmol/L (ref 3.5–5.1)
Sodium: 129 mmol/L — ABNORMAL LOW (ref 135–145)

## 2023-06-26 LAB — EXPECTORATED SPUTUM ASSESSMENT W GRAM STAIN, RFLX TO RESP C

## 2023-06-26 LAB — CBC
HCT: 40.9 % (ref 39.0–52.0)
Hemoglobin: 13.1 g/dL (ref 13.0–17.0)
MCH: 29.8 pg (ref 26.0–34.0)
MCHC: 32 g/dL (ref 30.0–36.0)
MCV: 93.2 fL (ref 80.0–100.0)
Platelets: 216 10*3/uL (ref 150–400)
RBC: 4.39 MIL/uL (ref 4.22–5.81)
RDW: 14.9 % (ref 11.5–15.5)
WBC: 17.3 10*3/uL — ABNORMAL HIGH (ref 4.0–10.5)
nRBC: 0 % (ref 0.0–0.2)

## 2023-06-26 LAB — STREP PNEUMONIAE URINARY ANTIGEN: Strep Pneumo Urinary Antigen: NEGATIVE

## 2023-06-26 MED ORDER — FUROSEMIDE 20 MG PO TABS
20.0000 mg | ORAL_TABLET | Freq: Every day | ORAL | Status: DC
  Administered 2023-06-27 – 2023-06-29 (×3): 20 mg via ORAL
  Filled 2023-06-26 (×3): qty 1

## 2023-06-26 NOTE — Progress Notes (Signed)
 [facility] PROGRESS NOTE  Cody Tate (DOB: 1950/04/30) FMW:[id number] PCP: Charlott Dorn LABOR, MD Outpatient Specialists: Pulmonology, Dr. Geronimo  Brief Narrative: RHEN KAWECKI is a 73 y.o. male with medical history significant for COPD, chronic HFpEF, HTN, HLD, BPH, chronic bilateral lower extremity edema (left > right), and recent hospitalization (5/21-5/27) for S. intermedius pneumonia complicated by parapneumonic effusion treated with chest tube who presented to the ED for evaluation of shortness of breath and hypoxia. CXR demonstrated new bilateral lower lobe opacities R > L and stable small left pleural effusion. He was confirmed to be hypoxemic, admitted for multifocal pneumonia.   Subjective: Breathing much better, has been ambulating but confirmed to be hypoxic with exertion. +cough, no chest pain. Wife and sisters at bedside.   Objective: BP (!) 127/56 (BP Location: Right Arm)   Pulse 83   Temp 98 F (36.7 C) (Oral)   Resp 17   Ht 5' 6 (1.676 m)   Wt 102 kg   SpO2 98%   BMI 36.29 kg/m   Gen: Elderly male in good spirits, no distress Pulm: Coarse bibasilar, no wheezes, nonlabored at rest  CV: RRR, no MRG, L > R LE edema GI: Soft, NT, ND, +BS Neuro: Alert and oriented. No new focal deficits. Ext: Warm, no deformities. Skin: No new rashes, lesions or ulcers on visualized skin   Assessment & Plan: Acute hypoxic respiratory failure due to multifocal pneumonia: SpO2 87-91% at home with PT, down to 84% on arrival to ED while on room air.  Currently stable on 2 L O2 via [place].  CXR and outpatient chest CT 6/23 shows changes of multifocal pneumonia.  WBC 22.4 on admission otherwise not meeting sepsis criteria.  Received cefepime and azithromycin in the ED. - Continue supplemental oxygen to maintain normal WOB and adequate saturations. - Continue azithromycin, ceftriaxone . Note prior S. intermedius susceptible to PCN, CTX, levaquin , vancomycin . Erythromycin  resistant. - Follow blood cultures - Ideally would have sputum culture to drive treatment, ordered and discussed.  - Obtain strep pneumoniae, Legionella urine antigens - Continue supplemental oxygen as needed - IS, FV, Mucinex    AKI: Improving.  - Continue to hold jardiance , telmisartan , NSAIDs, likely restart lasix  in AM with edema.   COPD: Stable without wheezing on admission.  Continue Breztri , Singulair , and Combivent  as needed.   Chronic HFpEF: Last EF 70-75%.  Patient has chronic bilateral lower extremity edema, left greater than right, which patient states is at baseline.  No evidence of decompensated CHF at time of admission.  Continue Toprol -XL.  Holding Jardiance  as above.   Hypertension: Continue Toprol -XL.  Holding telmisartan .   Hyperlipidemia: Continue rosuvastatin .   BPH/history of prostate cancer: Continue Proscar  and Flomax .  Monitor UOP.   Chronic pain syndrome: Continue home oxycodone  and gabapentin .  Bernardino KATHEE Come, MD [facility] [email or web address] 06/26/2023, 6:04 PM

## 2023-06-26 NOTE — Progress Notes (Signed)
 Mobility Specialist - Progress Note   06/26/23 0907  Oxygen Therapy  SpO2 (!) 85 %  O2 Device Room Air  Patient Activity (if Appropriate) Ambulating  Mobility  Activity Ambulated with assistance in hallway  Level of Assistance Standby assist, set-up cues, supervision of patient - no hands on  Assistive Device Front wheel walker  Distance Ambulated (ft) 100 ft  Activity Response Tolerated well  Mobility Referral Yes  Mobility visit 1 Mobility  Mobility Specialist Start Time (ACUTE ONLY) [id number]  Mobility Specialist Stop Time (ACUTE ONLY) 0906  Mobility Specialist Time Calculation (min) (ACUTE ONLY) 15 min   Pt received in bed and agreeable to mobility. During ambulation, pt desat to 85% on RA. Encouraged pursed lip breaths bringing SpO2 to 88%. Upon return to room, place [place]  back on. Pt to bed after session with all needs met.    [facility]

## 2023-06-27 DIAGNOSIS — J9601 Acute respiratory failure with hypoxia: Secondary | ICD-10-CM | POA: Diagnosis not present

## 2023-06-27 LAB — CBC
HCT: 40.8 % (ref 39.0–52.0)
Hemoglobin: 13 g/dL (ref 13.0–17.0)
MCH: 29.7 pg (ref 26.0–34.0)
MCHC: 31.9 g/dL (ref 30.0–36.0)
MCV: 93.4 fL (ref 80.0–100.0)
Platelets: 220 10*3/uL (ref 150–400)
RBC: 4.37 MIL/uL (ref 4.22–5.81)
RDW: 14.6 % (ref 11.5–15.5)
WBC: 13.9 10*3/uL — ABNORMAL HIGH (ref 4.0–10.5)
nRBC: 0 % (ref 0.0–0.2)

## 2023-06-27 LAB — BASIC METABOLIC PANEL WITH GFR
Anion gap: 11 (ref 5–15)
BUN: 20 mg/dL (ref 8–23)
CO2: 24 mmol/L (ref 22–32)
Calcium: 9 mg/dL (ref 8.9–10.3)
Chloride: 98 mmol/L (ref 98–111)
Creatinine, Ser: 0.79 mg/dL (ref 0.61–1.24)
GFR, Estimated: >60 mL/min (ref >60)
Glucose, Bld: 97 mg/dL (ref 70–99)
Potassium: 4.6 mmol/L (ref 3.5–5.1)
Sodium: 133 mmol/L — ABNORMAL LOW (ref 135–145)

## 2023-06-27 LAB — PROCALCITONIN: Procalcitonin: 0.64 ng/mL

## 2023-06-27 MED ORDER — SALINE SPRAY 0.65 % NA SOLN
1.0000 | NASAL | Status: DC | PRN
  Administered 2023-06-27: 1 via NASAL
  Filled 2023-06-27: qty 44

## 2023-06-27 MED ORDER — EMPAGLIFLOZIN 10 MG PO TABS
10.0000 mg | ORAL_TABLET | Freq: Every day | ORAL | Status: DC
  Administered 2023-06-27 – 2023-06-29 (×3): 10 mg via ORAL
  Filled 2023-06-27 (×3): qty 1

## 2023-06-27 MED ORDER — SIMETHICONE 80 MG PO CHEW: 160.0000 mg | CHEWABLE_TABLET | Freq: Every day | ORAL | Status: DC | PRN

## 2023-06-27 MED ORDER — FLUTICASONE PROPIONATE 50 MCG/ACT NA SUSP
1.0000 | Freq: Every day | NASAL | Status: DC
  Administered 2023-06-27 – 2023-06-29 (×3): 1 via NASAL
  Filled 2023-06-27: qty 16

## 2023-06-27 MED ORDER — IRBESARTAN 150 MG PO TABS
150.0000 mg | ORAL_TABLET | Freq: Every day | ORAL | Status: DC
  Administered 2023-06-27 – 2023-06-29 (×3): 150 mg via ORAL
  Filled 2023-06-27 (×3): qty 1

## 2023-06-27 MED ORDER — DIPHENHYDRAMINE HCL 25 MG PO CAPS
25.0000 mg | ORAL_CAPSULE | Freq: Every evening | ORAL | Status: DC | PRN
  Administered 2023-06-28: 25 mg via ORAL
  Filled 2023-06-27: qty 1

## 2023-06-27 MED ORDER — AZITHROMYCIN 250 MG PO TABS
500.0000 mg | ORAL_TABLET | Freq: Every day | ORAL | Status: DC
  Administered 2023-06-27 – 2023-06-28 (×2): 500 mg via ORAL
  Filled 2023-06-27 (×2): qty 2

## 2023-06-27 NOTE — Progress Notes (Signed)
 [facility] PROGRESS NOTE  Cody Tate (DOB: 1950-09-22) FMW:[id number] PCP: Charlott Dorn LABOR, MD Outpatient Specialists: Pulmonology, Dr. Geronimo  Brief Narrative: Cody Tate is a 73 y.o. male with medical history significant for COPD, chronic HFpEF, HTN, HLD, BPH, chronic bilateral lower extremity edema (left > right), and recent hospitalization (5/21-5/27) for S. intermedius pneumonia complicated by parapneumonic effusion treated with chest tube who presented to the ED for evaluation of shortness of breath and hypoxia. CXR demonstrated new bilateral lower lobe opacities R > L and stable small left pleural effusion. He was confirmed to be hypoxemic, admitted for multifocal pneumonia.   Has improved with antibiotics, still hypoxic with ambulation. Hopeful for continued improvement and discharge home 6/27 or so.   Subjective: Feeling better, had some emesis this morning. Wants home oxygen. No chest pain. Urinating normally, taking better po.  Objective: BP (!) 144/65 (BP Location: Right Arm)   Pulse 81   Temp 98.4 F (36.9 C)   Resp 16   Ht 5' 6 (1.676 m)   Wt 102 kg   SpO2 96%   BMI 36.29 kg/m   Gen: No distress Pulm: Bibasilar rhonchi improving, no wheezes, nonlabored on supplemental oxygen  CV: RRR, no MRG GI: Soft, NT, ND, +BS  Neuro: Alert and oriented. No new focal deficits. Ext: Warm, no deformities. Significant BLE edema stable. Skin: No new rashes, lesions or ulcers on visualized skin    Assessment & Plan: Acute hypoxic respiratory failure due to multifocal pneumonia: SpO2 87-91% at home with PT, down to 84% on arrival to ED while on room air.  Currently stable on 2 L O2 via [place].  CXR and outpatient chest CT 6/23 shows changes of multifocal pneumonia.  WBC 22.4 on admission otherwise not meeting sepsis criteria.  Received cefepime and azithromycin in the ED. - Continue supplemental oxygen to maintain normal WOB and adequate saturations. - Continue  azithromycin, ceftriaxone . Note prior S. intermedius susceptible to PCN, CTX, levaquin , vancomycin . Erythromycin resistant. - Blood cultures NGTD, sputum Cx from 6/25 gram stain GNR, GPC. Pneumococcal Ag neg, legionella pending. PCT elevated, will trend. - Continue guaifenesin , IS, FV. - Pt reports previously being told he needed nocturnal O2, never arranged. Will perform nocturnal desat screen tonight - Repeat ambulatory pulse oximetry, continue frequent mobilization efforts.    AKI: Improved. SCr 1.29 > 0.79.  - Will restart home medications of jardiance , telmisartan  (formulary equivalent), lasix . Continue holding NSAIDs.     COPD: Stable without wheezing on admission.   - Continue Breztri , singulair , prn duoneb   Chronic HFpEF: Last EF 70-75%.  Patient has chronic bilateral lower extremity edema, left greater than right, which patient states is at baseline.  No evidence of decompensated CHF at time of admission.   - Restart home lasix , continue metoprolol , jardiance    Hypertension: - Can continue metoprolol  succinate and restart ARB with elevated pressures and normalized Cr.    Hyperlipidemia: - Continue rosuvastatin .   BPH/history of prostate cancer: - Continue finasteride , tamsulosin .  Monitor UOP.   Chronic pain syndrome: - Continue home oxycodone  and gabapentin .  GERD:  - Continue PPI, simethicone  prn   Glaucoma:  - Continue gtt's  Bernardino KATHEE Come, MD [facility] [email or web address] 06/27/2023, 9:43 AM

## 2023-06-27 NOTE — Progress Notes (Signed)
 Mobility Specialist - Progress Note   06/27/23 0951  Mobility  Activity Ambulated with assistance in hallway;Ambulated with assistance to bathroom  Level of Assistance Modified independent, requires aide device or extra time  Assistive Device Front wheel walker  Distance Ambulated (ft) 275 ft  Activity Response Tolerated well  Mobility Referral Yes  Mobility visit 1 Mobility  Mobility Specialist Start Time (ACUTE ONLY) [id number]  Mobility Specialist Stop Time (ACUTE ONLY) [id number]  Mobility Specialist Time Calculation (min) (ACUTE ONLY) 34 min   Nurse requested Mobility Specialist to perform oxygen saturation test with pt which includes removing pt from oxygen both at rest and while ambulating.  Below are the results from that testing.     Patient Saturations on Room Air while Ambulating = sp02 87% .   Patient Saturations on 2 Liters of oxygen while Ambulating = sp02 92%  At end of testing pt left in room on 2  Liters of oxygen.  Reported results to nurse.  Pt received in bed and agreeable to mobility. Prior to ambulating, pt requested assistance to the bathroom. Pt to EOB after session with all needs met.    During mobility: 95 HR, 87-92% SpO2 (RA/2L [place]) Post-mobility: 93% SPO2 (2L [place])  Chief Technology Officer

## 2023-06-27 NOTE — Plan of Care (Signed)
  Problem: Clinical Measurements: Goal: Will remain free from infection Outcome: Progressing   Problem: Clinical Measurements: Goal: Respiratory complications will improve Outcome: Progressing   Problem: Nutrition: Goal: Adequate nutrition will be maintained Outcome: Completed/Met

## 2023-06-28 DIAGNOSIS — I5032 Chronic diastolic (congestive) heart failure: Secondary | ICD-10-CM | POA: Insufficient documentation

## 2023-06-28 DIAGNOSIS — N179 Acute kidney failure, unspecified: Secondary | ICD-10-CM | POA: Diagnosis not present

## 2023-06-28 DIAGNOSIS — I10 Essential (primary) hypertension: Secondary | ICD-10-CM | POA: Diagnosis not present

## 2023-06-28 DIAGNOSIS — N401 Enlarged prostate with lower urinary tract symptoms: Secondary | ICD-10-CM

## 2023-06-28 DIAGNOSIS — J9601 Acute respiratory failure with hypoxia: Secondary | ICD-10-CM | POA: Diagnosis not present

## 2023-06-28 DIAGNOSIS — N138 Other obstructive and reflux uropathy: Secondary | ICD-10-CM

## 2023-06-28 DIAGNOSIS — K219 Gastro-esophageal reflux disease without esophagitis: Secondary | ICD-10-CM

## 2023-06-28 DIAGNOSIS — J189 Pneumonia, unspecified organism: Secondary | ICD-10-CM | POA: Diagnosis not present

## 2023-06-28 DIAGNOSIS — E782 Mixed hyperlipidemia: Secondary | ICD-10-CM

## 2023-06-28 DIAGNOSIS — I5033 Acute on chronic diastolic (congestive) heart failure: Secondary | ICD-10-CM | POA: Insufficient documentation

## 2023-06-28 LAB — CBC
HCT: 42.5 % (ref 39.0–52.0)
Hemoglobin: 13.6 g/dL (ref 13.0–17.0)
MCH: 29.8 pg (ref 26.0–34.0)
MCHC: 32 g/dL (ref 30.0–36.0)
MCV: 93 fL (ref 80.0–100.0)
Platelets: 248 10*3/uL (ref 150–400)
RBC: 4.57 MIL/uL (ref 4.22–5.81)
RDW: 14.5 % (ref 11.5–15.5)
WBC: 10.8 10*3/uL — ABNORMAL HIGH (ref 4.0–10.5)
nRBC: 0 % (ref 0.0–0.2)

## 2023-06-28 LAB — BASIC METABOLIC PANEL WITH GFR
Anion gap: 14 (ref 5–15)
BUN: 15 mg/dL (ref 8–23)
CO2: 24 mmol/L (ref 22–32)
Calcium: 9.4 mg/dL (ref 8.9–10.3)
Chloride: 95 mmol/L — ABNORMAL LOW (ref 98–111)
Creatinine, Ser: 0.68 mg/dL (ref 0.61–1.24)
GFR, Estimated: >60 mL/min (ref >60)
Glucose, Bld: 95 mg/dL (ref 70–99)
Potassium: 3.6 mmol/L (ref 3.5–5.1)
Sodium: 133 mmol/L — ABNORMAL LOW (ref 135–145)

## 2023-06-28 LAB — PROCALCITONIN: Procalcitonin: 0.48 ng/mL

## 2023-06-28 LAB — LEGIONELLA PNEUMOPHILA SEROGP 1 UR AG: L. pneumophila Serogp 1 Ur Ag: NEGATIVE

## 2023-06-28 LAB — BRAIN NATRIURETIC PEPTIDE: B Natriuretic Peptide: 110.7 pg/mL — ABNORMAL HIGH (ref 0.0–100.0)

## 2023-06-28 MED ORDER — FUROSEMIDE 10 MG/ML IJ SOLN
20.0000 mg | Freq: Once | INTRAMUSCULAR | Status: AC
  Administered 2023-06-28: 20 mg via INTRAVENOUS
  Filled 2023-06-28: qty 2

## 2023-06-28 NOTE — Assessment & Plan Note (Signed)
 Metoprolol  resumed Irbesartan  were resumed with improving kidney function As needed intravenous hypertensives for markedly elevated blood pressure.

## 2023-06-28 NOTE — Assessment & Plan Note (Signed)
   No evidence of COPD exacerbation this time  Continue home regimen of maintenance inhalers  As needed bronchodilator therapy for episodic shortness of breath and wheezing.  

## 2023-06-28 NOTE — Progress Notes (Signed)
 SATURATION QUALIFICATIONS: (This note is used to comply with regulatory documentation for home oxygen)  Patient Saturations on Room Air at Rest = 93%  Patient Saturations on Room Air while Ambulating = 88%  Patient Saturations on 2 Liters of oxygen while Ambulating = 90%  Please briefly explain why patient needs home oxygen: This patient desats while ambulating in the hall on room air.

## 2023-06-28 NOTE — Assessment & Plan Note (Signed)
Continuing home regimen of daily PPI therapy.  

## 2023-06-28 NOTE — Assessment & Plan Note (Signed)
 Continued mild shortness of breath and significant peripheral edema in the setting of elevated BNP Possible superimposed acute on chronic diastolic congestive heart failure Treated with intermittent doses of intravenous Lasix 

## 2023-06-28 NOTE — Assessment & Plan Note (Signed)
 Secondary to underlying pneumonia Treating underlying pneumonia with intravenous antibiotic therapy Supplemental oxygen to maintain oxygen saturations between 89 to 92% Initiating intravenous Lasix  in case of superimposed acute [place]age and volume overload

## 2023-06-28 NOTE — TOC Initial Note (Signed)
 Transition of Care [facility]) - Initial/Assessment Note    Patient Details  Name: Cody Tate MRN: [id number] Date of Birth: [birth year]  [facility]) CM/SW Contact:    Sheri ONEIDA Sharps, LCSW Phone Number: 06/28/2023, 3:11 PM  Clinical Narrative:                 Pt from home with spouse. Pt continues medical workup. TOC following for dc needs.    Barriers to Discharge: Continued Medical Work up   Patient Goals and CMS Choice Patient states their goals for this hospitalization and ongoing recovery are:: return home   Choice offered to / list presented to : NA      Expected Discharge Plan and Services In-house Referral: NA Discharge Planning Services: NA   Living arrangements for the past 2 months: Single Family Home                 DME Arranged: N/A DME Agency: NA       HH Arranged: NA HH Agency: NA        Prior Living Arrangements/Services Living arrangements for the past 2 months: Single Family Home Lives with:: Spouse Patient language and need for interpreter reviewed:: Yes Do you feel safe going back to the place where you live?: Yes      Need for Family Participation in Patient Care: Yes (Comment) Care giver support system in place?: Yes (comment)   Criminal Activity/Legal Involvement Pertinent to Current Situation/Hospitalization: No - Comment as needed  Activities of Daily Living   ADL Screening (condition at time of admission) Independently performs ADLs?: Yes (appropriate for developmental age) Is the patient deaf or have difficulty hearing?: No Does the patient have difficulty seeing, even when wearing glasses/contacts?: No Does the patient have difficulty concentrating, remembering, or making decisions?: No  Permission Sought/Granted                  Emotional Assessment Appearance:: Appears stated age Attitude/Demeanor/Rapport: Engaged Affect (typically observed): Accepting Orientation: : Oriented to  Time, Oriented to Place,  Oriented to Self, Oriented to Situation Alcohol  / Substance Use: Not Applicable Psych Involvement: No (comment)  Admission diagnosis:  Community acquired bacterial pneumonia [J15.9] Multifocal pneumonia [J18.9] Acute hypoxic respiratory failure (HCC) [J96.01] Patient Active Problem List   Diagnosis Date Noted   Multifocal pneumonia 06/25/2023   AKI (acute kidney injury) (HCC) 06/25/2023   Opioid-induced constipation 05/26/2023   Empyema (HCC) 05/24/2023   Parapneumonic effusion 05/23/2023   Acute hypoxic respiratory failure (HCC) 05/23/2023   Acute hyponatremia 05/23/2023   Hypokalemia 05/23/2023   Sepsis with acute organ dysfunction without septic shock (HCC) 05/22/2023   CAP (community acquired pneumonia) 05/02/2023   Hemoptysis 05/02/2023   Mild mitral regurgitation 10/30/2022   Aortic valve stenosis 10/30/2022   COPD (chronic obstructive pulmonary disease) (HCC) 01/11/2022   GERD (gastroesophageal reflux disease) 01/11/2022   Hypercholesterolemia 01/11/2022   Essential hypertension 01/11/2022   Obesity, Class II, BMI 35-39.9 01/11/2022   Other chronic pain 01/11/2022   Pulmonary nodule 01/11/2022   Lumbar foraminal stenosis 01/11/2022   Aortic ectasia (HCC) 01/11/2022   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   Malignant neoplasm of prostate (HCC) 07/12/2020   S/P insertion of spinal cord stimulator 01/16/2018   Back pain with history of spinal surgery 09/09/2017   Inflammation of sacroiliac joint (HCC) 09/20/2016   Opioid dependence (HCC) 01/10/2016   Swelling of lower limb 03/25/2014   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  Complains of low back pain 09/09/2013   Lumbosacral radiculitis 12/03/2012   Spinal stenosis of lumbar region 10/30/2012   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   Lumbosacral spondylosis without myelopathy 10/23/2012   Dizziness 06/25/2012   Backache 06/25/2012   Benign neoplasm of cerebral meninges (HCC)  06/25/2012   Neck pain 06/25/2012   Lumbar stenosis with neurogenic claudication 05/08/2011   PCP:  Charlott Dorn LABOR, MD Pharmacy:   [facility] [phone] [place], [place] - [address] AT [address] [place] [address] Phone: [phone] Fax: [phone]  [id number] [place], [place] - [address] AT [facility] [address] [place] [address] Phone: [phone] Fax: [phone]     Social Drivers of Health (SDOH) Social History: SDOH Screenings   Food Insecurity: No Food Insecurity (06/25/2023)  Housing: Low Risk  (06/25/2023)  Transportation Needs: No Transportation Needs (06/25/2023)  Utilities: Not At Risk (06/25/2023)  Social Connections: Moderately Integrated (06/25/2023)  Tobacco Use: Medium Risk (06/25/2023)   SDOH Interventions:     Readmission Risk Interventions    06/28/2023    3:10 PM  Readmission Risk Prevention Plan  Transportation Screening Complete  PCP or Specialist Appt within 5-7 Days Complete  Home Care Screening Complete  Medication Review (RN CM) Complete

## 2023-06-28 NOTE — Assessment & Plan Note (Signed)
 Continuing intravenous antibiotic therapy with ceftriaxone  and azithromycin  Leukocytosis improving Afebrile Remainder of assessment and plan as above

## 2023-06-28 NOTE — Assessment & Plan Note (Signed)
.   Continuing home regimen of lipid lowering therapy.  

## 2023-06-28 NOTE — Plan of Care (Signed)
   Problem: Clinical Measurements: Goal: Will remain free from infection Outcome: Progressing   Problem: Clinical Measurements: Goal: Diagnostic test results will improve Outcome: Progressing   Problem: Clinical Measurements: Goal: Respiratory complications will improve Outcome: Progressing

## 2023-06-28 NOTE — Progress Notes (Signed)
 PROGRESS NOTE   Cody Tate  FMW:[id number] DOB: 1950/10/28 DOA: 06/25/2023 PCP: Charlott Dorn LABOR, MD   Date of Service: the patient was seen and examined on 06/28/2023  Brief Narrative:  73 y.o. male with medical history significant for COPD, chronic HFpEF, HTN, HLD, BPH, chronic bilateral lower extremity edema (left > right) and recent hospitalization in May for strep intermedius pneumonia complicated by parapneumonic effusion requiring temporary chest tube who presented to the ED for evaluation of shortness of breath and hypoxia.   Patient was found to be suffering from acute hypoxic respiratory failure secondary to recurrent multifocal pneumonia   Assessment & Plan Acute hypoxic respiratory failure (HCC) Secondary to underlying pneumonia Treating underlying pneumonia with intravenous antibiotic therapy Supplemental oxygen to maintain oxygen saturations between 89 to 92% Initiating intravenous Lasix  in case of superimposed acute [place] and volume overload Pneumonia of both lower lobes due to infectious organism Continuing intravenous antibiotic therapy with ceftriaxone  and azithromycin  Leukocytosis improving Afebrile Remainder of assessment and plan as above Acute on chronic diastolic CHF (congestive heart failure) (HCC) Continued mild shortness of breath and significant peripheral edema in the setting of elevated BNP Possible superimposed acute on chronic diastolic congestive heart failure Treated with intermittent doses of intravenous Lasix  COPD (chronic obstructive pulmonary disease) (HCC) No evidence of COPD exacerbation this time Continue home regimen of maintenance inhalers As needed bronchodilator therapy for episodic shortness of breath and wheezing.  AKI (acute kidney injury) (HCC) Resolved Essential hypertension Metoprolol  resumed Irbesartan  were resumed with improving kidney function As needed intravenous hypertensives for markedly elevated blood  pressure. BPH with obstruction/lower urinary tract symptoms Continue Flomax  Mixed hyperlipidemia Continuing home regimen of lipid lowering therapy.  GERD without esophagitis Continuing home regimen of daily PPI therapy.      Subjective:  Patient continuing to complain of mild shortness of breath with exertion.  Patient states his symptoms are improving.  Shortness of breath is worse with exertion and improved with rest.  Physical Exam:  Vitals:   06/27/23 0554 06/27/23 1314 06/27/23 2137 06/28/23 0606  BP: (!) 144/65 (!) 150/71 (!) 159/72 (!) 148/66  Pulse: 81 84 79 79  Resp: 16 17 18 18   Temp: 98.4 F (36.9 C) 98.4 F (36.9 C) 98.4 F (36.9 C) 98.4 F (36.9 C)  TempSrc:  Oral Oral Oral  SpO2: 96% 97% 92% 97%  Weight:      Height:        Constitutional: Awake alert and oriented x3, no associated distress.   Skin: no rashes, no lesions, good skin turgor noted. Eyes: Pupils are equally reactive to light.  No evidence of scleral icterus or conjunctival pallor.  ENMT: Moist mucous membranes noted.  Posterior pharynx clear of any exudate or lesions.   Respiratory: Bibasilar rales.  No evidence of wheezing.  Normal respiratory effort. No accessory muscle use.  Cardiovascular: Regular rate and rhythm, no murmurs / rubs / gallops. No extremity edema. 2+ pedal pulses. No carotid bruits.  Abdomen: Abdomen is soft and nontender.  No evidence of intra-abdominal masses.  Positive bowel sounds noted in all quadrants.   Musculoskeletal: No joint deformity upper and lower extremities. Good ROM, no contractures. Normal muscle tone.    Data Reviewed:  I have personally reviewed and interpreted labs, imaging.  Significant findings are   CBC: Recent Labs  Lab 06/25/23 1724 06/26/23 0455 06/27/23 0611 06/28/23 0449  WBC 22.4* 17.3* 13.9* 10.8*  NEUTROABS 20.7*  --   --   --  HGB 13.7 13.1 13.0 13.6  HCT 41.1 40.9 40.8 42.5  MCV 90.5 93.2 93.4 93.0  PLT 223 216 220 248    Basic Metabolic Panel: Recent Labs  Lab 06/25/23 1724 06/26/23 0455 06/27/23 0433 06/28/23 0449  NA 132* 129* 133* 133*  K 4.3 4.0 4.6 3.6  CL 97* 95* 98 95*  CO2 23 25 24 24   GLUCOSE 98 91 97 95  BUN 29* 25* 20 15  CREATININE 1.29* 0.90 0.79 0.68  CALCIUM  9.3 8.5* 9.0 9.4   GFR: Estimated Creatinine Clearance: 92 mL/min (by C-G formula based on SCr of 0.68 mg/dL). Liver Function Tests: Recent Labs  Lab 06/25/23 1724  AST 27  ALT 18  ALKPHOS 132*  BILITOT 0.8  PROT 6.6  ALBUMIN 3.7     Code Status:  Full code.  Code status decision has been confirmed with: patient Family Communication: Spouse is at the bedside and has been updated on plan of care   Severity of Illness:  The appropriate patient status for this patient is INPATIENT. Inpatient status is judged to be reasonable and necessary in order to provide the required intensity of service to ensure the patient's safety. The patient's presenting symptoms, physical exam findings, and initial radiographic and laboratory data in the context of their chronic comorbidities is felt to place them at high risk for further clinical deterioration. Furthermore, it is not anticipated that the patient will be medically stable for discharge from the hospital within 2 midnights of admission.   * I certify that at the point of admission it is my clinical judgment that the patient will require inpatient hospital care spanning beyond 2 midnights from the point of admission due to high intensity of service, high risk for further deterioration and high frequency of surveillance required.*  Time spent:  39 minutes  Author:  Zachary JINNY Ba MD  06/28/2023 8:56 AM

## 2023-06-28 NOTE — Assessment & Plan Note (Signed)
-   Continue Flomax

## 2023-06-28 NOTE — Assessment & Plan Note (Signed)
 Resolved

## 2023-06-28 NOTE — Progress Notes (Signed)
 Mobility Specialist - Progress Note   06/28/23 0912  Oxygen Therapy  SpO2 (!) 88 %  O2 Device Nasal Cannula  O2 Flow Rate (L/min) 2 L/min  Patient Activity (if Appropriate) Ambulating  Mobility  Activity Ambulated with assistance in hallway  Level of Assistance Standby assist, set-up cues, supervision of patient - no hands on  Assistive Device Front wheel walker  Distance Ambulated (ft) 250 ft  Activity Response Tolerated fair  Mobility Referral Yes  Mobility visit 1 Mobility  Mobility Specialist Start Time (ACUTE ONLY) 0850  Mobility Specialist Stop Time (ACUTE ONLY) 0912  Mobility Specialist Time Calculation (min) (ACUTE ONLY) 22 min   Pt received in bed and agreeable to mobility. Prior to ambulating, pt requested assistance to the bathroom. During ambulation, pt desat to 88% x2 requiring as standing rest break. SpO2 back up to 91%. SOB & fatigued with session. Pt to EOB after session with all needs met.  Pre-mobility: 93% SpO2 (2L [place]) During mobility: 88-91% SpO2 (2L [place]) Post-mobility: 90% SPO2 (2L [place])  Chief Technology Officer

## 2023-06-29 ENCOUNTER — Other Ambulatory Visit (HOSPITAL_COMMUNITY): Payer: Self-pay

## 2023-06-29 ENCOUNTER — Telehealth: Payer: Self-pay | Admitting: Internal Medicine

## 2023-06-29 DIAGNOSIS — N179 Acute kidney failure, unspecified: Secondary | ICD-10-CM | POA: Diagnosis not present

## 2023-06-29 DIAGNOSIS — J9601 Acute respiratory failure with hypoxia: Secondary | ICD-10-CM | POA: Diagnosis not present

## 2023-06-29 DIAGNOSIS — I10 Essential (primary) hypertension: Secondary | ICD-10-CM | POA: Diagnosis not present

## 2023-06-29 DIAGNOSIS — J189 Pneumonia, unspecified organism: Secondary | ICD-10-CM | POA: Diagnosis not present

## 2023-06-29 LAB — COMPREHENSIVE METABOLIC PANEL WITH GFR
ALT: 32 U/L (ref 0–44)
AST: 34 U/L (ref 15–41)
Albumin: 3 g/dL — ABNORMAL LOW (ref 3.5–5.0)
Alkaline Phosphatase: 127 U/L — ABNORMAL HIGH (ref 38–126)
Anion gap: 10 (ref 5–15)
BUN: 18 mg/dL (ref 8–23)
CO2: 29 mmol/L (ref 22–32)
Calcium: 9.3 mg/dL (ref 8.9–10.3)
Chloride: 94 mmol/L — ABNORMAL LOW (ref 98–111)
Creatinine, Ser: 0.73 mg/dL (ref 0.61–1.24)
GFR, Estimated: >60 mL/min (ref >60)
Glucose, Bld: 103 mg/dL — ABNORMAL HIGH (ref 70–99)
Potassium: 4.1 mmol/L (ref 3.5–5.1)
Sodium: 133 mmol/L — ABNORMAL LOW (ref 135–145)
Total Bilirubin: 0.6 mg/dL (ref 0.0–1.2)
Total Protein: 7 g/dL (ref 6.5–8.1)

## 2023-06-29 LAB — CBC WITH DIFFERENTIAL/PLATELET
Abs Immature Granulocytes: 0.11 10*3/uL — ABNORMAL HIGH (ref 0.00–0.07)
Basophils Absolute: 0.1 10*3/uL (ref 0.0–0.1)
Basophils Relative: 1 %
Eosinophils Absolute: 0.2 10*3/uL (ref 0.0–0.5)
Eosinophils Relative: 2 %
HCT: 41.2 % (ref 39.0–52.0)
Hemoglobin: 13.3 g/dL (ref 13.0–17.0)
Immature Granulocytes: 1 %
Lymphocytes Relative: 8 %
Lymphs Abs: 0.7 10*3/uL (ref 0.7–4.0)
MCH: 28.9 pg (ref 26.0–34.0)
MCHC: 32.3 g/dL (ref 30.0–36.0)
MCV: 89.6 fL (ref 80.0–100.0)
Monocytes Absolute: 1.7 10*3/uL — ABNORMAL HIGH (ref 0.1–1.0)
Monocytes Relative: 18 %
Neutro Abs: 6.4 10*3/uL (ref 1.7–7.7)
Neutrophils Relative %: 70 %
Platelets: 256 10*3/uL (ref 150–400)
RBC: 4.6 MIL/uL (ref 4.22–5.81)
RDW: 14.3 % (ref 11.5–15.5)
WBC: 9.2 10*3/uL (ref 4.0–10.5)
nRBC: 0 % (ref 0.0–0.2)

## 2023-06-29 LAB — PHOSPHORUS: Phosphorus: 4 mg/dL (ref 2.5–4.6)

## 2023-06-29 LAB — MAGNESIUM: Magnesium: 1.9 mg/dL (ref 1.7–2.4)

## 2023-06-29 MED ORDER — METRONIDAZOLE 500 MG PO TABS
500.0000 mg | ORAL_TABLET | Freq: Two times a day (BID) | ORAL | 0 refills | Status: AC
  Filled 2023-06-29: qty 28, 14d supply, fill #0

## 2023-06-29 MED ORDER — CEPHALEXIN 500 MG PO CAPS
500.0000 mg | ORAL_CAPSULE | Freq: Two times a day (BID) | ORAL | Status: DC
  Administered 2023-06-29: 500 mg via ORAL
  Filled 2023-06-29: qty 1

## 2023-06-29 MED ORDER — BREZTRI AEROSPHERE 160-9-4.8 MCG/ACT IN AERO: 2.0000 | INHALATION_SPRAY | Freq: Two times a day (BID) | RESPIRATORY_TRACT | Status: DC

## 2023-06-29 MED ORDER — CEPHALEXIN 500 MG PO CAPS
500.0000 mg | ORAL_CAPSULE | Freq: Two times a day (BID) | ORAL | 0 refills | Status: AC
  Filled 2023-06-29: qty 28, 14d supply, fill #0

## 2023-06-29 MED ORDER — METRONIDAZOLE 500 MG PO TABS
500.0000 mg | ORAL_TABLET | Freq: Two times a day (BID) | ORAL | Status: DC
  Administered 2023-06-29: 500 mg via ORAL
  Filled 2023-06-29: qty 1

## 2023-06-29 NOTE — TOC Transition Note (Signed)
 Transition of Care [facility]) - Discharge Note   Patient Details  Name: KANAN SOBEK MRN: [id number] Date of Birth: 05/02/1950  [facility]) CM/SW Contact:  Sonda Manuella Quill, RN Phone Number: 06/29/2023, 11:05 AM   Clinical Narrative:    D/C orders received; home oxygen set up w/ [employer]; no TOC needs.   Final next level of care: Home/Self Care Barriers to Discharge: No Barriers Identified   Patient Goals and CMS Choice Patient states their goals for this hospitalization and ongoing recovery are:: return home   Choice offered to / list presented to : NA      Discharge Placement                       Discharge Plan and Services Additional resources added to the After Visit Summary for   In-house Referral: NA Discharge Planning Services: NA            DME Arranged: Oxygen DME Agency: [employer] Date DME Agency Contacted: 06/29/23 Time DME Agency Contacted: 1023 Representative spoke with at DME Agency: [place] HH Arranged: NA HH Agency: NA        Social Drivers of Health (SDOH) Interventions SDOH Screenings   Food Insecurity: No Food Insecurity (06/25/2023)  Housing: Low Risk  (06/25/2023)  Transportation Needs: No Transportation Needs (06/25/2023)  Utilities: Not At Risk (06/25/2023)  Social Connections: Moderately Integrated (06/25/2023)  Tobacco Use: Medium Risk (06/25/2023)     Readmission Risk Interventions    06/28/2023    3:10 PM  Readmission Risk Prevention Plan  Transportation Screening Complete  PCP or Specialist Appt within 5-7 Days Complete  Home Care Screening Complete  Medication Review (RN CM) Complete

## 2023-06-29 NOTE — Progress Notes (Signed)
.  Mobility Specialist - Progress Note   06/29/23 0858  Oxygen Therapy  SpO2 90 %  O2 Device Room Air  Patient Activity (if Appropriate) Ambulating  Mobility  Activity Ambulated with assistance in hallway  Level of Assistance Modified independent, requires aide device or extra time  Assistive Device Front wheel walker  Distance Ambulated (ft) 250 ft  Activity Response Tolerated well  Mobility Referral Yes  Mobility visit 1 Mobility  Mobility Specialist Start Time (ACUTE ONLY) 0848  Mobility Specialist Stop Time (ACUTE ONLY) 0857  Mobility Specialist Time Calculation (min) (ACUTE ONLY) 9 min   Pt received in recliner and agreeable to mobility. No complaints during session. Pt to recliner after session with all needs met. Pt eager to go home.     Pre-mobility: 95% SpO2 (RA) During mobility: 90% SpO2 (RA) Post-mobility: 92% SPO2 (RA)  Chief Technology Officer

## 2023-06-29 NOTE — Discharge Instructions (Signed)
 Please take all prescribed medications exactly as instructed including your entire course of antibiotics Flagyl and Keflex. Please consume a low sodium low carbohydrate diet Please increase your physical activity as tolerated. Please use 2 liters of oxygen via nasal canula when walking or exerting yourself. Please maintain all outpatient follow-up appointments including follow-up with your primary care provider and Dr. Ardath with Pulmonology. Please return to the emergency department if you develop worsening shortness of breath, fevers in excess of 100.4 F, weakness or inability to tolerate oral intake.

## 2023-06-29 NOTE — TOC Progression Note (Signed)
 Transition of Care [facility]) - Progression Note    Patient Details  Name: Cody Tate MRN: [id number] Date of Birth: 1950/04/30  Transition of Care [facility]) CM/SW Contact  Sonda Manuella Quill, RN Phone Number: 06/29/2023, 10:28 AM  Clinical Narrative:    Orders received for home oxygen; spoke w/ pt and wife June in room; pt agrees to receive home oxygen; he does not have an agency preference; spoke w/ Jermaine at [place]; he says agency can provide service, and travel tank will be delivered to room; pt and wife notified; agency contact info placed in follow up provider section of d/c instructions.     Barriers to Discharge: Continued Medical Work up  Expected Discharge Plan and Services In-house Referral: NA Discharge Planning Services: NA   Living arrangements for the past 2 months: Single Family Home                 DME Arranged: Oxygen DME Agency: [employer] Date DME Agency Contacted: 06/29/23 Time DME Agency Contacted: 1023 Representative spoke with at DME Agency: [place] HH Arranged: NA HH Agency: NA         Social Determinants of Health (SDOH) Interventions SDOH Screenings   Food Insecurity: No Food Insecurity (06/25/2023)  Housing: Low Risk  (06/25/2023)  Transportation Needs: No Transportation Needs (06/25/2023)  Utilities: Not At Risk (06/25/2023)  Social Connections: Moderately Integrated (06/25/2023)  Tobacco Use: Medium Risk (06/25/2023)    Readmission Risk Interventions    06/28/2023    3:10 PM  Readmission Risk Prevention Plan  Transportation Screening Complete  PCP or Specialist Appt within 5-7 Days Complete  Home Care Screening Complete  Medication Review (RN CM) Complete

## 2023-06-29 NOTE — Telephone Encounter (Signed)
 Again wti pneumoni and MASS HAs now shurink to cavity. But has different nodular infilates in LL  Plan - dw [facility]  - at sone point when needs bronch  - ok to dc portalb o2 - 2 week cephalexin + flagyl - [facility] to elicit - denitision, aspiration. Post nasal drip, etoh, mold exposure, bird feather pillow/blanket/sofa in hose   FRONT DESK  - giv ehim 2 week follwup with APP/any MD - keep aug meet with me     SIGNATURE    Dr. Dorethia Cave, M.D., F.C.C.P,  Pulmonary and Critical Care Medicine Staff Physician, [facility] Center Director - Interstitial Lung Disease  Program  Pulmonary Fibrosis [facility] [place], [place], [address]   Pager: [phone], If no answer  -> Check AMION or Try [phone] Telephone (clinical office): [phone] Telephone (research): [phone]  8:32 AM 06/29/2023

## 2023-06-29 NOTE — Progress Notes (Signed)
 SATURATION QUALIFICATIONS: (This note is used to comply with regulatory documentation for home oxygen)  Patient Saturations on Room Air at Rest = 93%  Patient Saturations on Room Air while Ambulating = 86%  Patient Saturations on 2 Liters of oxygen while Ambulating = 93%  Please briefly explain why patient needs home oxygen:pt de-sats with ambulation

## 2023-06-29 NOTE — Discharge Summary (Signed)
 Physician Discharge Summary   Patient: Cody Tate MRN: [id number] DOB: 08/25/1950  Admit date:     06/25/2023  Discharge date: 06/29/23  Discharge Physician: Zachary JINNY Ba   PCP: Charlott Dorn LABOR, MD   Recommendations at discharge:    Please take all prescribed medications exactly as instructed including your entire course of antibiotics Flagyl and Keflex. Please consume a low sodium low carbohydrate diet Please increase your physical activity as tolerated. Please use 2 liters of oxygen via nasal canula when walking or exerting yourself. Please maintain all outpatient follow-up appointments including follow-up with your primary care provider and Dr. Ardath with Pulmonology. Please return to the emergency department if you develop worsening shortness of breath, fevers in excess of 100.4 F, weakness or inability to tolerate oral intake.    Discharge Diagnoses: Principal Problem:   Acute hypoxic respiratory failure (HCC) Active Problems:   Essential hypertension   Pneumonia of both lower lobes due to infectious organism   AKI (acute kidney injury) (HCC)   BPH with obstruction/lower urinary tract symptoms   COPD (chronic obstructive pulmonary disease) (HCC)   GERD without esophagitis   Mixed hyperlipidemia   Other chronic pain   Acute on chronic diastolic CHF (congestive heart failure) (HCC)  Resolved Problems:   * No resolved hospital problems. *   Hospital Course: 73 y.o. male with medical history significant for COPD, chronic HFpEF, HTN, HLD, BPH, chronic bilateral lower extremity edema (left > right) and recent hospitalization in May for strep intermedius pneumonia complicated by parapneumonic effusion requiring temporary chest tube who presented to the ED for evaluation of shortness of breath and hypoxia.   Upon evaluation in the emergency department patient was found to be suffering from acute hypoxic respiratory failure.  Patient was found to have  substantial leukocytosis of 22.4 with CT imaging of the chest revealing multifocal pneumonia.  Hospitalist group was then called to assess the patient for admission to the hospital.  Patient was treated with a course of intravenous ceftriaxone  and azithromycin .  Patient was found to be suffering from acute hypoxic respiratory failure secondary to recurrent multifocal pneumonia.  In the days that followed patient slowly clinically improved and was weaned off of supplemental oxygen while at rest.  Ambulatory oxygen assessment performed prior to discharge did revealed the patient did require 2 L of oxygen with exertion however.  Prior to discharge the patient's case was reviewed with Dr. Geronimo with pulmonology.  While the masslike consolidation in the left upper lobe seem to be improving CT imaging did reveal continued nodular infiltrates throughout the left lung fields.  Dr. Geronimo only felt that the patient would require an additional 2 weeks of oral antibiotic therapy and close continued follow-up in his clinic with consideration of a bronchoscopy in the coming weeks.  With patient clinically improving arrangements were made for the patient to go home with supplemental oxygen 2 L with exertion.  Arrangements were made for the patient to follow-up with Dr. Geronimo in 1 to 2 weeks in clinic and the patient was sent home with a 2-week course of Keflex and Flagyl.     Pain control - [place]  Controlled Substance Reporting System database was reviewed. and patient was instructed, not to drive, operate heavy machinery, perform activities at heights, swimming or participation in water  activities or provide baby-sitting services while on Pain, Sleep and Anxiety Medications; until their outpatient Physician has advised to do so again. Also recommended to not to take more than prescribed  Pain, Sleep and Anxiety Medications.   Consultants: Phone consult - Dr. Geronimo Procedures performed: none   Disposition: Home Diet recommendation:  Discharge Diet Orders (From admission, onward)     Start     Ordered   06/29/23 0000  Diet - low sodium heart healthy        06/29/23 1128           Cardiac and Carb modified diet  DISCHARGE MEDICATION: Allergies as of 06/29/2023       Reactions   Amlodipine Besylate Other (See Comments)   fatigue   Aspirin  Other (See Comments)    GI upset   Other Itching, Other (See Comments), Rash, Dermatitis   VICRYL Sutures   Tape Itching, Other (See Comments), Rash   Occlusive tape and bandaids   Wound Dressing Adhesive Rash        Medication List     TAKE these medications    Afrin 12 Hour 0.05 % nasal spray Generic drug: oxymetazoline  Place 1 spray into both nostrils 2 (two) times daily as needed for congestion.   aspirin  EC 81 MG tablet Take 1 tablet (81 mg total) by mouth daily. Swallow whole.   Benadryl  Allergy  25 MG tablet Generic drug: diphenhydrAMINE  Take 25 mg by mouth at bedtime.   Breztri  Aerosphere 160-9-4.8 MCG/ACT Aero inhaler Generic drug: budesonide -glycopyrrolate -formoterol  Inhale 2 puffs into the lungs in the morning and at bedtime.   celecoxib  200 MG capsule Commonly known as: CELEBREX  Take 1 capsule (200 mg total) by mouth 2 (two) times daily.   cephALEXin 500 MG capsule Commonly known as: KEFLEX Take 1 capsule (500 mg total) by mouth every 12 (twelve) hours for 14 days.   chlorhexidine  4 % external liquid Commonly known as: HIBICLENS  Apply 1 Application topically daily as needed (boils).   Combivent  Respimat 20-100 MCG/ACT Aers respimat Generic drug: Ipratropium-Albuterol  Inhale 1 puff into the lungs every 6 (six) hours as needed for wheezing or shortness of breath.   empagliflozin  10 MG Tabs tablet Commonly known as: Jardiance  Take 1 tablet (10 mg total) by mouth daily.   finasteride  5 MG tablet Commonly known as: PROSCAR  Take 5 mg by mouth daily.   fluticasone  50 MCG/ACT nasal  spray Commonly known as: FLONASE  Place 1 spray into both nostrils 2 (two) times daily.   furosemide  20 MG tablet Commonly known as: LASIX  Take 1 tablet (20 mg total) by mouth 2 (two) times daily for 7 days, THEN 1 tablet (20 mg total) daily for 21 days. Start taking on: May 28, 2023 What changed: See the new instructions.   gabapentin  300 MG capsule Commonly known as: NEURONTIN  Take 1 capsule (300 mg total) by mouth 4 (four) times daily.   Lutein-Zeaxanthin 25-5 MG Caps Take 1 capsule by mouth daily.   magic mouthwash w/lidocaine  Soln Take 15 mLs by mouth 4 (four) times daily as needed for mouth pain. contains equal amounts of Maalox Extra Strength, nystatin, diphenhydramine  and lidocaine .   magnesium  hydroxide 400 MG/5ML suspension Commonly known as: MILK OF MAGNESIA Take 15 mLs by mouth at bedtime.   metoprolol  succinate 100 MG 24 hr tablet Commonly known as: TOPROL -XL Take 1 tablet (100 mg total) by mouth at bedtime. Take with or immediately following a meal.   metroNIDAZOLE 500 MG tablet Commonly known as: FLAGYL Take 1 tablet (500 mg total) by mouth every 12 (twelve) hours for 14 days.   montelukast  10 MG tablet Commonly known as: SINGULAIR  TAKE 1 TABLET BY MOUTH AT  BEDTIME   Mucinex  Maximum Strength 1200 MG Tb12 Generic drug: Guaifenesin  Take 1,200 mg by mouth 2 (two) times daily.   multivitamin with minerals Tabs tablet Take 1 tablet by mouth daily.   omeprazole  40 MG capsule Commonly known as: PRILOSEC Take 1 capsule (40 mg total) by mouth daily before breakfast.   Oxycodone  HCl 10 MG Tabs Take 10 mg by mouth every 4 (four) hours.   rosuvastatin  20 MG tablet Commonly known as: CRESTOR  Take 1 tablet (20 mg total) by mouth at bedtime.   Simethicone  125 MG Caps Take 250 mg by mouth daily as needed (for gas).   Systane Complete PF 0.6 % Soln Generic drug: Propylene Glycol (PF) Place 2 drops into both eyes daily as needed (dry eyes).   tamsulosin  0.4  MG Caps capsule Commonly known as: FLOMAX  Take 0.4 mg by mouth 2 (two) times daily.   telmisartan  80 MG tablet Commonly known as: MICARDIS  Take 0.5 tablets (40 mg total) by mouth 2 (two) times daily.   timolol  0.5 % ophthalmic solution Commonly known as: TIMOPTIC  Place 1 drop into both eyes at bedtime.   Trelegy Ellipta  200-62.5-25 MCG/ACT Aepb Generic drug: Fluticasone -Umeclidin-Vilant Inhale 1 puff into the lungs daily.   vitamin C  1000 MG tablet Take 1,000 mg by mouth daily.   Vitamin D3 50 MCG (2000 UT) capsule Take 4,000 Units by mouth daily.               Durable Medical Equipment  (From admission, onward)           Start     Ordered   06/29/23 0849  DME Oxygen  (Discharge Planning)  Once       Question Answer Comment  Length of Need Lifetime   Mode or (Route) Nasal cannula   Liters per Minute 2   Frequency Continuous (stationary and portable oxygen unit needed)   Oxygen delivery system Gas      06/29/23 0849            Follow-up Information     [facility] Follow up.   Contact information: [facility]) [address] [place], [place] [address] [phone]        Geronimo Amel, MD Follow up in 1 week(s).   Specialty: Pulmonary Disease Contact information: [address] [place] [address] [phone]         Charlott Dorn LABOR, MD Follow up.   Specialty: Internal Medicine Contact information: [address] [place] [address] [phone]                 Discharge Exam: Filed Weights   06/26/23 0500 06/26/23 1300 06/29/23 0500  Weight: 102 kg 102 kg 102.2 kg    Constitutional: Awake alert and oriented x3, no associated distress.   Respiratory: Mild bibasilar rales, no evidence of wheezing.  Normal respiratory effort. No accessory muscle use.  Cardiovascular: Regular rate and rhythm, no murmurs / rubs / gallops.  Distal bilateral lower extremity pitting edema.   2+ pedal pulses. No carotid bruits.  Abdomen: Abdomen is soft and nontender.  No evidence of intra-abdominal masses.  Positive bowel sounds noted in all quadrants.   Musculoskeletal: No joint deformity upper and lower extremities. Good ROM, no contractures. Normal muscle tone.     Condition at discharge: fair  The results of significant diagnostics from this hospitalization (including imaging, microbiology, ancillary and laboratory) are listed below for reference.   Imaging Studies: DG  Chest Portable 1 View Result Date: 06/25/2023 CLINICAL DATA:  Shortness of breath EXAM: PORTABLE CHEST 1 VIEW COMPARISON:  Chest x-ray 06/05/2023.  CT of the chest 06/24/2023. FINDINGS: Focal opacity in the lateral left lower lung with pleural thickening has minimally decreased. There are new airspace opacities in the bilateral lung bases, right greater than left. Stable small left pleural effusion. No pneumothorax. Cardiomediastinal silhouette appears stable. Osseous structures are unchanged. IMPRESSION: 1. New airspace opacities in the bilateral lung bases, right greater than left, concerning for multifocal pneumonia. 2. Focal opacity in the lateral left lower lung with pleural thickening has minimally decreased. 3. Stable small left pleural effusion. Electronically Signed   By: Greig Pique M.D.   On: 06/25/2023 17:48   DG Chest 2 View Result Date: 06/05/2023 CLINICAL DATA:  FU cavitary pneumonia EXAM: CHEST - 2 VIEW COMPARISON:  05/26/2023 FINDINGS: Peripheral pleural based consolidation in the lateral left lung has decreased in size. Some increase in mild regional interstitial opacities. Right lung clear. Heart size and mediastinal contours are within normal limits. Aortic Atherosclerosis (ICD10-170.0). Persistent blunting of left costophrenic angle suggesting effusion or scarring. Cervical and lumbar fixation hardware. Paired dorsal stimulator catheters extend up to the T8 level. IMPRESSION: Decreased size of  lateral left lung consolidation. Electronically Signed   By: JONETTA Faes M.D.   On: 06/05/2023 11:44    Microbiology: Results for orders placed or performed during the hospital encounter of 06/25/23  Culture, blood (routine x 2)     Status: None (Preliminary result)   Collection Time: 06/25/23  5:53 PM   Specimen: BLOOD  Result Value Ref Range Status   Specimen Description   Final    BLOOD BLOOD RIGHT ARM Performed at [facility], [address] [place], [place] [address]    Special Requests   Final    Blood Culture results may not be optimal due to an inadequate volume of blood received in culture bottles BOTTLES DRAWN AEROBIC AND ANAEROBIC Performed at [facility], [address], [place], [place] [address]    Culture   Final    NO GROWTH 4 DAYS Performed at [facility], [address] [place], [place] [address]    Report Status PENDING  Incomplete  Culture, blood (routine x 2)     Status: None (Preliminary result)   Collection Time: 06/25/23  5:58 PM   Specimen: BLOOD  Result Value Ref Range Status   Specimen Description   Final    BLOOD BLOOD LEFT ARM Performed at [facility], [address] [place], [place] [address]    Special Requests   Final    Blood Culture adequate volume BOTTLES DRAWN AEROBIC AND ANAEROBIC Performed at [facility], [address] [place], [place] [address]    Culture   Final    NO GROWTH 4 DAYS Performed at [facility], [address] [place], [place] [address]    Report Status PENDING  Incomplete  Expectorated Sputum Assessment w Gram Stain, Rflx to Resp Cult     Status: None   Collection Time: 06/26/23  2:05 PM   Specimen: Expectorated Sputum  Result Value Ref Range Status   Specimen Description EXPECTORATED SPUTUM  Final   Special Requests NONE  Final   Sputum evaluation   Final    THIS SPECIMEN IS ACCEPTABLE FOR SPUTUM CULTURE Performed  at [facility], [address] [place], [place] [address]    Report Status  06/26/2023 FINAL  Final  Culture, Respiratory w Gram Stain     Status: None (Preliminary result)   Collection Time: 06/26/23  2:05 PM  Result Value Ref Range Status   Specimen Description   Final    EXPECTORATED SPUTUM Performed at [facility], [address] [place], [place] [address]    Special Requests   Final    NONE Reflexed from [id number] Performed at [facility], [address], [place], [place] [address]    Gram Stain   Final    FEW WBC PRESENT, PREDOMINANTLY PMN FEW GRAM NEGATIVE RODS FEW GRAM POSITIVE COCCI    Culture   Final    FEW PSEUDOMONAS AERUGINOSA SUSCEPTIBILITIES TO FOLLOW Performed at [facility], [address], [place], [place] [address]    Report Status PENDING  Incomplete    Labs: CBC: Recent Labs  Lab 06/25/23 1724 06/26/23 0455 06/27/23 0611 06/28/23 0449 06/29/23 0616  WBC 22.4* 17.3* 13.9* 10.8* 9.2  NEUTROABS 20.7*  --   --   --  6.4  HGB 13.7 13.1 13.0 13.6 13.3  HCT 41.1 40.9 40.8 42.5 41.2  MCV 90.5 93.2 93.4 93.0 89.6  PLT 223 216 220 248 256   Basic Metabolic Panel: Recent Labs  Lab 06/25/23 1724 06/26/23 0455 06/27/23 0433 06/28/23 0449 06/29/23 0616  NA 132* 129* 133* 133* 133*  K 4.3 4.0 4.6 3.6 4.1  CL 97* 95* 98 95* 94*  CO2 23 25 24 24 29   GLUCOSE 98 91 97 95 103*  BUN 29* 25* 20 15 18   CREATININE 1.29* 0.90 0.79 0.68 0.73  CALCIUM  9.3 8.5* 9.0 9.4 9.3  MG  --   --   --   --  1.9  PHOS  --   --   --   --  4.0   Liver Function Tests: Recent Labs  Lab 06/25/23 1724 06/29/23 0616  AST 27 34  ALT 18 32  ALKPHOS 132* 127*  BILITOT 0.8 0.6  PROT 6.6 7.0  ALBUMIN 3.7 3.0*   CBG: No results for input(s): GLUCAP in the last 168 hours.  Discharge time spent: greater than 30 minutes.  Signed: Zachary JINNY Ba, MD [facility] 06/29/2023

## 2023-06-29 NOTE — Progress Notes (Signed)
 TOC meds in a secure bag delivered to pt in room by this RN.

## 2023-06-29 NOTE — Evaluation (Signed)
 Clinical/Bedside Swallow Evaluation Patient Details  Name: ALESSANDER SIKORSKI MRN: [id number] Date of Birth: [birth year]  Today's Date: 06/29/2023 Time: SLP Start Time (ACUTE ONLY): 9045 SLP Stop Time (ACUTE ONLY): 1003 SLP Time Calculation (min) (ACUTE ONLY): 9 min  Past Medical History:  Past Medical History:  Diagnosis Date   Acute heart failure with preserved ejection fraction (HCC) 11/12/2022   Arthritis    Back pain    Radiates down both legs   BPH (benign prostatic hyperplasia)    Cancer (HCC) 2022   prostate cancer   COPD (chronic obstructive pulmonary disease) (HCC)    Dyspnea    Enlarged prostate    GERD (gastroesophageal reflux disease)    Glaucoma    H/O hiatal hernia    H/O wheezing    occ inhaler usage   Heart murmur    Hypertension    Lumbar foraminal stenosis    Macular degeneration    Meningioma (HCC) 2014   Treated with radiation   Neuromuscular disorder (HCC)    Neuropathy bilateral legs   PONV (postoperative nausea and vomiting)    Pulmonary nodules    resolved on 2019 follow up CT   S/P insertion of spinal cord stimulator    Past Surgical History:  Past Surgical History:  Procedure Laterality Date   ABDOMINAL EXPOSURE N/A 03/16/2022   Procedure: ABDOMINAL EXPOSURE;  Surgeon: Gretta Lonni PARAS, MD;  Location: [facility] OR;  Service: Vascular;  Laterality: N/A;   ANTERIOR LUMBAR FUSION N/A 03/16/2022   Procedure: Anterior Lumbar Interbody Fusion - Lumbar Five-Sacral One with cage;  Surgeon: Louis Shove, MD;  Location: [facility] OR;  Service: Neurosurgery;  Laterality: N/A;   BACK SURGERY  06/08/2006   lam x3 cerv x 1   CERVICAL FUSION     COLONOSCOPY  2020   CYSTOSCOPY     73 yrs old   CYSTOSCOPY WITH URETHRAL DILATATION N/A 09/21/2021   Procedure: CYSTOSCOPY WITH BALLOON DILATION and OPTILUME BALLOON DILATION OF  URETHRAL DILATATION;  Surgeon: Elisabeth Valli BIRCH, MD;  Location: [facility] ORS;  Service: Urology;  Laterality: N/A;   EYE SURGERY     bil   GOLD SEED  IMPLANT N/A 09/06/2020   Procedure: GOLD SEED IMPLANT/ PLACEMENT OF FIDUCIAL MARKERS;  Surgeon: Elisabeth Valli BIRCH, MD;  Location: [facility];  Service: Urology;  Laterality: N/A;   KNEE ARTHROSCOPY  01/02/2008   lft   KNEE ARTHROSCOPY Left 07/23/2012   Procedure: LEFT MEDIAL MENISCAL DEBRIDEMENT AND CHONDROPLASTY;  Surgeon: Dempsey LULLA Moan, MD;  Location: [facility];  Service: Orthopedics;  Laterality: Left;   LUMBAR FUSION  02/22/2014   DR POOL   PILONIDAL CYST EXCISION     SINUS EXPLORATION     SPACE OAR INSTILLATION N/A 09/06/2020   Procedure: SPACE OAR INSTILLATION;  Surgeon: Elisabeth Valli BIRCH, MD;  Location: [facility];  Service: Urology;  Laterality: N/A;   SPINAL CORD STIMULATOR INSERTION N/A 01/16/2018   Procedure: LUMBAR SPINAL CORD STIMULATOR INSERTION;  Surgeon: Joshua Alm RAMAN, MD;  Location: [facility] OR;  Service: Neurosurgery;  Laterality: N/A;  LUMBAR SPINAL CORD STIMULATOR INSERTION   TRANSURETHRAL RESECTION OF PROSTATE N/A 09/06/2020   Procedure: TRANSURETHRAL RESECTION OF THE PROSTATE (TURP);  Surgeon: Elisabeth Valli BIRCH, MD;  Location: [facility];  Service: Urology;  Laterality: N/A;  2 HRS   WRIST GANGLION EXCISION     rt   WRIST SURGERY  01/02/2007   cyst lft   HPI:  Patient is a 73 y.o. male with PMH: COPD,  chronic HFpEF, HTN, HLD, BPH, chronic bilateral lower extremity edema, GERD. He presented to the hospital on 06/25/23 with SOB and hypoxia. Portable CXR from 6/24 showed New airspace opacities in the bilateral lung bases, right greater than left, concerning for multifocal pneumonia. SLP swallow evaluation ordered on 06/29/23.    Assessment / Plan / Recommendation  Clinical Impression  Patient is not presenting with clinical s/s of dysphagia as per this bedside swallow evaluation. Swallow was assessed via PO's of thin liquids (water ) and solids (fruit). Patient with timely swallow initiation and no overt s/s aspiration. He has no current or previous c/o dysphagia but does endorse h/o GERD  which he manages with Omeprozole. In addition, he sleeps on a pillow wedge and tries to avoid eating too close to bedtime as that aggravates his symptoms. SLP is not recommending further skilled intervention but recommends patient continue to follow GERD precautions. SLP Visit Diagnosis: Dysphagia, unspecified (R13.10)    Aspiration Risk  Mild aspiration risk    Diet Recommendation Regular;Thin liquid    Liquid Administration via: Cup;Straw Medication Administration: Whole meds with liquid Supervision: Patient able to self feed Compensations: Slow rate;Small sips/bites    Other  Recommendations Oral Care Recommendations: Oral care BID     Assistance Recommended at Discharge    Functional Status Assessment Patient has not had a recent decline in their functional status  Frequency and Duration   N/A         Prognosis   N/A     Swallow Study   General Date of Onset: 06/29/23 HPI: Patient is a 73 y.o. male with PMH: COPD, chronic HFpEF, HTN, HLD, BPH, chronic bilateral lower extremity edema, GERD. He presented to the hospital on 06/25/23 with SOB and hypoxia. Portable CXR from 6/24 showed New airspace opacities in the bilateral lung bases, right greater than left, concerning for multifocal pneumonia. SLP swallow evaluation ordered on 06/29/23. Type of Study: Bedside Swallow Evaluation Previous Swallow Assessment: none found Diet Prior to this Study: Regular;Thin liquids (Level 0) Temperature Spikes Noted: No Respiratory Status: Nasal cannula History of Recent Intubation: No Behavior/Cognition: Alert;Cooperative;Pleasant mood Oral Care Completed by SLP: No Oral Cavity - Dentition: Adequate natural dentition Vision: Functional for self-feeding Self-Feeding Abilities: Able to feed self Patient Positioning: Upright in bed Baseline Vocal Quality: Normal Volitional Cough: Strong Volitional Swallow: Able to elicit    Oral/Motor/Sensory Function Overall Oral Motor/Sensory Function:  Within functional limits   Ice Chips     Thin Liquid Thin Liquid: Within functional limits Presentation: Straw;Self Fed    Nectar Thick     Honey Thick     Puree Puree: Not tested   Solid     Solid: Within functional limits Presentation: Self Fed      Norleen IVAR Blase, MA, CCC-SLP Speech Therapy

## 2023-06-30 LAB — CULTURE, BLOOD (ROUTINE X 2)
Culture: NO GROWTH
Culture: NO GROWTH
Special Requests: ADEQUATE

## 2023-07-01 LAB — CULTURE, RESPIRATORY W GRAM STAIN

## 2023-07-02 DIAGNOSIS — H353231 Exudative age-related macular degeneration, bilateral, with active choroidal neovascularization: Secondary | ICD-10-CM | POA: Diagnosis not present

## 2023-07-03 ENCOUNTER — Ambulatory Visit (INDEPENDENT_AMBULATORY_CARE_PROVIDER_SITE_OTHER): Admitting: Primary Care

## 2023-07-03 ENCOUNTER — Encounter: Payer: Self-pay | Admitting: Primary Care

## 2023-07-03 VITALS — BP 134/82 | HR 57 | Temp 97.5°F | Ht 66.5 in | Wt 227.0 lb

## 2023-07-03 DIAGNOSIS — J189 Pneumonia, unspecified organism: Secondary | ICD-10-CM

## 2023-07-03 DIAGNOSIS — J9601 Acute respiratory failure with hypoxia: Secondary | ICD-10-CM

## 2023-07-03 DIAGNOSIS — J449 Chronic obstructive pulmonary disease, unspecified: Secondary | ICD-10-CM | POA: Diagnosis not present

## 2023-07-03 DIAGNOSIS — J918 Pleural effusion in other conditions classified elsewhere: Secondary | ICD-10-CM

## 2023-07-03 DIAGNOSIS — Z87891 Personal history of nicotine dependence: Secondary | ICD-10-CM | POA: Diagnosis not present

## 2023-07-03 DIAGNOSIS — I5032 Chronic diastolic (congestive) heart failure: Secondary | ICD-10-CM | POA: Diagnosis not present

## 2023-07-03 NOTE — Progress Notes (Signed)
 @Patient  ID: Cody Tate, male    DOB: 1950/08/15, 74 y.o.   MRN: [id number]  Chief Complaint  Patient presents with   Follow-up    Hospital f/u.     Referring provider: Charlott Dorn LABOR, *  HPI: 73 year old male, former smoker.  Past medical history significant for heart failure, hypertension, mild mitral regurgitation, COPD, lung nodule, GERD, meningioma, BPH, lumbar stenosis, opioid dependency, status post spinal cord stimulator.  Patient of Dr. Geronimo.   Previous [facility] pulmonary encounter:  01/16/2023 Discussed the use of AI scribe software for clinical note transcription with the patient, who gave verbal consent to proceed.   History of Present Illness   The patient, with a history of emphysema, coronary artery disease, and diastolic dysfunction, presented for a follow-up visit after a recent shortness of breath episode. The patient had undergone several tests, including lab work, HRCT scan, and a PET scan. The lab results indicated a mild elevation in BNP, suggesting possible fluid retention due to heart function, and positive allergy  results for various grasses. The patient's alpha 1 level was normal, ruling out a genetic predisposition for COPD.   The PET scan showed residual post-infectious or inflammatory scarring in the right upper lobe of the lung, likely due to a recent respiratory infection. The patient reported a history of coughing and mucus production a few months prior, which has since resolved. The PET scan also revealed evidence of previous radiation to the prostate but no signs of metastatic disease.   The patient's HRCT scan showed emphysema and consolidation in the right upper lobe, which has improved and left some scarring. The patient also has coronary artery calcification, indicating some plaque buildup, and an irregular liver margin, suggesting possible cirrhosis. The patient reported a history of moderate alcohol  consumption years ago.   The patient is  currently on a regimen of Trelegy for maintenance and Combivent  Respimat as a rescue inhaler for his emphysema. The frequency of Combivent  use varies, sometimes not needed, other times used once or twice a day. The patient also takes Lasix  and Jardiance  for heart-related issues and Simvastatin  for cholesterol management.   The patient reported some issues with fluid retention, which fluctuates and affects his mobility. He also expressed a desire for improved breathing, which may be affected by the fluid retention. The patient does not currently use oxygen and has not had a breathing test since 2015.    04/23/2023 -  Dr. Geronimo     Chief Complaint  Patient presents with   Acute Visit      Increased cough x 3 days. He has had some yellow sputum. Breathing has been slightly worse. He mowed grass last wk without a mask.      Cody Tate 73 y.o. -returns for follow-up for his COPD associated with right upper lobe lung density/scarring [seen in November 2024 with improvement and PET scan December 2024].  He says a week ago he is here with his wife.  He says a week ago/16/25 was mowing the grass has been outdoors then from 04/21/2023 is having cough congestion yellow sputum wheezing.  All this has gotten better.  Today was having a low-grade fever.  He is still not back to baseline still having the symptoms.  Of note he did have a PET scan in December 2020 for additional improvement there but still the significant size of the scar tissue is partially compared to 2019 when it was all clear.  I would recommend a follow-up CT.  He has upcoming pulmonary function test May 2025.  Of note he is on Trelegy but he prefers Breztri .  We gave him a sample and will send a prescription.   05/02/2023 Acute OV : Hemoptysis , COPD  Patient presents for an acute office visit.  He was seen in the office 1 week ago for COPD exacerbation.  He had developed acute symptoms after mowing the grass with cough, congestion,  wheezing and low-grade fever.  He was given doxycycline  x 5 days and a prednisone  burst.  Patient says he did have some improvement but continues to have ongoing symptoms of cough and congestion.  Patient says he has been coughing up pink-tinged mucus over the last 2 weeks and this morning he noticed that he had some dark red mixed in his mucus.  He coughed up several times this morning with bloody mucus.  No frank hemoptysis.  Has had some intermittent low-grade fever.  He says appetite is good.  No nausea vomiting diarrhea.  No recent travel.  No rash or joint swelling. Chest xray today shows a rounded left mid lung consolidation/mass    He has COPD with emphysema.  Remains on Breztri  twice daily.  Has Combivent  as needed for rescue inhaler.  Patient is not on any anticoagulation therapy.  Does not take aspirin  on a regular basis.    06/05/2023 Discussed the use of AI scribe software for clinical note transcription with the patient, who gave verbal consent to proceed.  History of Present Illness   Cody Tate is a 73 year old male who presents for a hospital follow-up after treatment for community-acquired pneumonia with sepsis and acute hypoxic respiratory failure.  He was admitted to the hospital from May 21 to May 27 for community-acquired pneumonia with sepsis. Initially treated outpatient with Levaquin  and doxycycline , he showed some improvement but developed fever and confusion at home, leading to his admission for hypoxic respiratory failure. During hospitalization, a chest x-ray revealed fluid in the lungs, necessitating chest tube placement from May 22 to May 24. He received IV antibiotics, including Zosyn  and vancomycin . Blood cultures were negative, and Pseudomonas was initially suspected but later ruled out. He was discharged on Augmentin  for eight days post-hospitalization.  He feels better overall but still experiences some exertional shortness of breath and a persistent cough,  especially in the mornings, with yellow mucus production. No recent fevers or confusion since being home. He is not on oxygen and does not have a nebulizer at home but uses a rescue inhaler as needed, which helps. He takes Mucinex  regularly.  He has been on Lasix  20 mg daily and was directed to take an extra dose for a week but has not started this. He has lower extremity swelling, which has been a long-standing issue. He wears compression stockings to manage the swelling. He also takes oxycodone  as needed and reports no confusion or mental status changes at home.    He is not engaging in strenuous activities or yard work, and his brother-in-law assists with yard maintenance. He is able to perform daily activities such as showering, bathing, and attending appointments. He has an upcoming appointment with an infectious disease doctor on June 19 to evaluate recurrent pneumonia and is wondering if he still needs to keep this visit.   No recent fevers, confusion, or hemoptysis.     07/03/2023- Interim hx  Discussed the use of AI scribe software for clinical note transcription with the patient, who gave verbal consent to proceed.  History of Present Illness   LENIN KUHNLE is a 73 year old male with COPD and chronic heart failure who presents for follow-up after hospitalization for acute hypoxic respiratory failure secondary to multifocal pneumonia. He is accompanied by his caregiver, who assists with his care and monitoring.  He was hospitalized from June 24th to June 28th for acute hypoxic respiratory failure due to multifocal pneumonia. During his admission, he had significant leukocytosis with a white blood cell count of 22.4, and CT imaging confirmed multifocal pneumonia. He was treated with IV ceftriaxone  and azithromycin , showing clinical improvement. He was weaned off supplemental oxygen at rest but required two liters of oxygen with exertion upon discharge. He was discharged with a two-week course  of oral antibiotics, Keflex  and Flagyl .  He continues to experience shortness of breath, which is slightly improved but not back to baseline. He uses Trelegy for COPD management and has a rescue inhaler, Combivent  Respimat, which he uses sporadically as needed. He also experiences congestion which improves with the use of his rescue inhaler. For congestion management, he uses Mucinex , Afrin, and Flonase .  He has a history of COPD and emphysema, with previous episodes of chronic bronchitis. He quit smoking 20-25 years ago due to persistent bronchitis. He has been on Lasix  for heart failure management and has experienced swelling in his legs, for which he uses compression stockings.  He has been using supplemental oxygen at home occasionally, with his caregiver monitoring his oxygen levels. His oxygen saturation dropped to 88-89% before his recent hospitalization, but it has been around 92-93% since then. He tries to minimize oxygen use, using it only when necessary. No fever or significant mucus production.      Allergies  Allergen Reactions   Amlodipine Besylate Other (See Comments)    fatigue   Aspirin  Other (See Comments)     GI upset   Other Itching, Other (See Comments), Rash and Dermatitis    VICRYL Sutures   Tape Itching, Other (See Comments) and Rash    Occlusive tape and bandaids   Wound Dressing Adhesive Rash    Immunization History  Administered Date(s) Administered   DTP 06/01/2001   Fluad Quad(high Dose 65+) 09/16/2019   Influenza Split 10/16/2010, 10/08/2012, 10/24/2012, 10/20/2013   Influenza, High Dose Seasonal PF 10/22/2014, 09/07/2015   Influenza-Unspecified 10/03/2018, 11/02/2022   PFIZER(Purple Top)SARS-COV-2 Vaccination 02/13/2019, 03/10/2019, 10/05/2019, 04/19/2020   Pfizer Covid-19 Vaccine Bivalent Booster 49yrs & up 09/29/2020   Pneumococcal Conjugate-13 10/22/2014   Pneumococcal Polysaccharide-23 02/10/2015   Tdap 09/18/2011, 05/17/2012   Zoster  Recombinant(Shingrix) 08/18/2018   Zoster, Live 08/04/2010    Past Medical History:  Diagnosis Date   Acute heart failure with preserved ejection fraction (HCC) 11/12/2022   Arthritis    Back pain    Radiates down both legs   BPH (benign prostatic hyperplasia)    Cancer (HCC) 2022   prostate cancer   COPD (chronic obstructive pulmonary disease) (HCC)    Dyspnea    Enlarged prostate    GERD (gastroesophageal reflux disease)    Glaucoma    H/O hiatal hernia    H/O wheezing    occ inhaler usage   Heart murmur    Hypertension    Lumbar foraminal stenosis    Macular degeneration    Meningioma (HCC) 2014   Treated with radiation   Neuromuscular disorder (HCC)    Neuropathy bilateral legs   PONV (postoperative nausea and vomiting)    Pulmonary nodules    resolved on  2019 follow up CT   S/P insertion of spinal cord stimulator     Tobacco History: Social History   Tobacco Use  Smoking Status Former   Current packs/day: 0.00   Average packs/day: 1.5 packs/day for 35.0 years (52.5 ttl pk-yrs)   Types: Cigarettes   Start date: 05/03/1965   Quit date: 05/03/2000   Years since quitting: 23.1  Smokeless Tobacco Never  Tobacco Comments   quit alcohol  73   Counseling given: Not Answered Tobacco comments: quit alcohol  73   Outpatient Medications Prior to Visit  Medication Sig Dispense Refill   AFRIN 12 HOUR 0.05 % nasal spray Place 1 spray into both nostrils 2 (two) times daily as needed for congestion.     Ascorbic Acid  (VITAMIN C ) 1000 MG tablet Take 1,000 mg by mouth daily.     aspirin  EC 81 MG tablet Take 1 tablet (81 mg total) by mouth daily. Swallow whole. (Patient not taking: Reported on 07/03/2023)     budesonide -glycopyrrolate -formoterol  (BREZTRI  AEROSPHERE) 160-9-4.8 MCG/ACT AERO inhaler Inhale 2 puffs into the lungs in the morning and at bedtime.     celecoxib  (CELEBREX ) 200 MG capsule Take 1 capsule (200 mg total) by mouth 2 (two) times daily. 180 capsule 0    cephALEXin  (KEFLEX ) 500 MG capsule Take 1 capsule (500 mg total) by mouth every 12 (twelve) hours for 14 days. 28 capsule 0   chlorhexidine  (HIBICLENS ) 4 % external liquid Apply 1 Application topically daily as needed (boils).     Cholecalciferol  (VITAMIN D3) 50 MCG (2000 UT) capsule Take 4,000 Units by mouth daily.     diphenhydrAMINE  (BENADRYL  ALLERGY ) 25 MG tablet Take 25 mg by mouth at bedtime.     empagliflozin  (JARDIANCE ) 10 MG TABS tablet Take 1 tablet (10 mg total) by mouth daily. 90 tablet 0   finasteride  (PROSCAR ) 5 MG tablet Take 5 mg by mouth daily.     fluticasone  (FLONASE ) 50 MCG/ACT nasal spray Place 1 spray into both nostrils 2 (two) times daily.     Fluticasone -Umeclidin-Vilant (TRELEGY ELLIPTA ) 200-62.5-25 MCG/ACT AEPB Inhale 1 puff into the lungs daily.     furosemide  (LASIX ) 20 MG tablet Take 1 tablet (20 mg total) by mouth 2 (two) times daily for 7 days, THEN 1 tablet (20 mg total) daily for 21 days. (Patient taking differently: Take 20 mg by mouth once daily. ) 90 tablet 0   gabapentin  (NEURONTIN ) 300 MG capsule Take 1 capsule (300 mg total) by mouth 4 (four) times daily. 360 capsule 0   Guaifenesin  (MUCINEX  MAXIMUM STRENGTH) 1200 MG TB12 Take 1,200 mg by mouth 2 (two) times daily.     Ipratropium-Albuterol  (COMBIVENT  RESPIMAT) 20-100 MCG/ACT AERS respimat Inhale 1 puff into the lungs every 6 (six) hours as needed for wheezing or shortness of breath. 12 g 3   Lutein-Zeaxanthin 25-5 MG CAPS Take 1 capsule by mouth daily.     magic mouthwash w/lidocaine  SOLN Take 15 mLs by mouth 4 (four) times daily as needed for mouth pain. contains equal amounts of Maalox Extra Strength, nystatin, diphenhydramine  and lidocaine . 1000 mL 0   magnesium  hydroxide (MILK OF MAGNESIA) 400 MG/5ML suspension Take 15 mLs by mouth at bedtime.     metoprolol  succinate (TOPROL -XL) 100 MG 24 hr tablet Take 1 tablet (100 mg total) by mouth at bedtime. Take with or immediately following a meal. 90 tablet 0    metroNIDAZOLE  (FLAGYL ) 500 MG tablet Take 1 tablet (500 mg total) by mouth every 12 (twelve) hours for  14 days. 28 tablet 0   montelukast  (SINGULAIR ) 10 MG tablet TAKE 1 TABLET BY MOUTH AT  BEDTIME 90 tablet 3   Multiple Vitamin (MULTIVITAMIN WITH MINERALS) TABS tablet Take 1 tablet by mouth daily.     omeprazole  (PRILOSEC) 40 MG capsule Take 1 capsule (40 mg total) by mouth daily before breakfast. 90 capsule 3   Oxycodone  HCl 10 MG TABS Take 10 mg by mouth every 4 (four) hours.     Propylene Glycol, PF, (SYSTANE COMPLETE PF) 0.6 % SOLN Place 2 drops into both eyes daily as needed (dry eyes).     rosuvastatin  (CRESTOR ) 20 MG tablet Take 1 tablet (20 mg total) by mouth at bedtime. 90 tablet 0   Simethicone  125 MG CAPS Take 250 mg by mouth daily as needed (for gas).     Tamsulosin  HCl (FLOMAX ) 0.4 MG CAPS Take 0.4 mg by mouth 2 (two) times daily.     telmisartan  (MICARDIS ) 80 MG tablet Take 0.5 tablets (40 mg total) by mouth 2 (two) times daily. 90 tablet 0   timolol  (TIMOPTIC ) 0.5 % ophthalmic solution Place 1 drop into both eyes at bedtime.     No facility-administered medications prior to visit.    Review of Systems  Review of Systems  Constitutional: Negative.   Respiratory: Negative.     Physical Exam  There were no vitals taken for this visit. Physical Exam Constitutional:      General: He is not in acute distress.    Appearance: Normal appearance. He is not ill-appearing.  HENT:     Head: Normocephalic and atraumatic.     Mouth/Throat:     Mouth: Mucous membranes are moist.     Pharynx: Oropharynx is clear.  Cardiovascular:     Rate and Rhythm: Normal rate and regular rhythm.  Pulmonary:     Effort: Pulmonary effort is normal.     Breath sounds: Normal breath sounds.  Skin:    General: Skin is warm and dry.  Neurological:     General: No focal deficit present.     Mental Status: He is alert and oriented to person, place, and time. Mental status is at baseline.   Psychiatric:        Mood and Affect: Mood normal.        Behavior: Behavior normal.        Thought Content: Thought content normal.        Judgment: Judgment normal.      Lab Results:  CBC    Component Value Date/Time   WBC 9.2 06/29/2023 0616   RBC 4.60 06/29/2023 0616   HGB 13.3 06/29/2023 0616   HCT 41.2 06/29/2023 0616   PLT 256 06/29/2023 0616   MCV 89.6 06/29/2023 0616   MCH 28.9 06/29/2023 0616   MCHC 32.3 06/29/2023 0616   RDW 14.3 06/29/2023 0616   LYMPHSABS 0.7 06/29/2023 0616   MONOABS 1.7 (H) 06/29/2023 0616   EOSABS 0.2 06/29/2023 0616   BASOSABS 0.1 06/29/2023 0616    BMET    Component Value Date/Time   NA 133 (L) 06/29/2023 0616   NA 141 03/21/2023 1307   K 4.1 06/29/2023 0616   CL 94 (L) 06/29/2023 0616   CO2 29 06/29/2023 0616   GLUCOSE 103 (H) 06/29/2023 0616   BUN 18 06/29/2023 0616   BUN 18 03/21/2023 1307   BUN 22.8 08/05/2014 1203   CREATININE 0.73 06/29/2023 0616   CREATININE 1.0 08/05/2014 1203   CALCIUM  9.3 06/29/2023 0616  GFRNONAA >60 06/29/2023 0616   GFRAA 100 01/29/2020 1430    BNP    Component Value Date/Time   BNP 110.7 (H) 06/28/2023 0449    ProBNP    Component Value Date/Time   PROBNP 633.0 (H) 06/25/2023 1724   PROBNP 335 03/21/2023 1307   PROBNP 119.0 (H) 11/15/2022 1450    Imaging: DG Chest Portable 1 View Result Date: 06/25/2023 CLINICAL DATA:  Shortness of breath EXAM: PORTABLE CHEST 1 VIEW COMPARISON:  Chest x-ray 06/05/2023.  CT of the chest 06/24/2023. FINDINGS: Focal opacity in the lateral left lower lung with pleural thickening has minimally decreased. There are new airspace opacities in the bilateral lung bases, right greater than left. Stable small left pleural effusion. No pneumothorax. Cardiomediastinal silhouette appears stable. Osseous structures are unchanged. IMPRESSION: 1. New airspace opacities in the bilateral lung bases, right greater than left, concerning for multifocal pneumonia. 2. Focal  opacity in the lateral left lower lung with pleural thickening has minimally decreased. 3. Stable small left pleural effusion. Electronically Signed   By: Greig Pique M.D.   On: 06/25/2023 17:48   DG Chest 2 View Result Date: 06/05/2023 CLINICAL DATA:  FU cavitary pneumonia EXAM: CHEST - 2 VIEW COMPARISON:  05/26/2023 FINDINGS: Peripheral pleural based consolidation in the lateral left lung has decreased in size. Some increase in mild regional interstitial opacities. Right lung clear. Heart size and mediastinal contours are within normal limits. Aortic Atherosclerosis (ICD10-170.0). Persistent blunting of left costophrenic angle suggesting effusion or scarring. Cervical and lumbar fixation hardware. Paired dorsal stimulator catheters extend up to the T8 level. IMPRESSION: Decreased size of lateral left lung consolidation. Electronically Signed   By: JONETTA Faes M.D.   On: 06/05/2023 11:44     Assessment & Plan:   No problem-specific Assessment & Plan notes found for this encounter.  Assessment and Plan    Multifocal pneumonia Recent hospitalization for multifocal pneumonia with substantial leukocytosis. Treated with IV ceftriaxone  and azithromycin , followed by oral Keflex  and Flagyl . Clinical improvement, but persistent nodular infiltrates in left lung fields.  - Continue Keflex  and Flagyl  for two weeks. - Order chest CT in four weeks to assess resolution of pneumonia. - Consider bronchoscopy if CT shows no improvement or if symptoms persist. - Discuss potential change from Trelegy to Anoro or Stiolto if recurrent pneumonia persists due to ICS   Acute hypoxic respiratory failure Resolved acute hypoxic respiratory failure secondary to multifocal pneumonia. No longer requiring supplemental oxygen at rest, but requires 2L with exertion. - Monitor oxygen requirements, especially with exertion. - Check oxygen saturation during walking test.  Chronic obstructive pulmonary disease (COPD) COPD with  chronic bronchitis and emphysema. Currently using Trelegy and Combivent  Respimat as needed. Reports improvement with Trelegy compared to Anoro. Discussed potential risk of pneumonia with inhaled steroids, but benefits currently outweigh risks. - Continue Trelegy and Combivent  Respimat as needed. - Consider switching to Anoro or Stiolto if recurrent pneumonia persists.  Chronic heart failure with preserved ejection fraction Chronic heart failure with preserved ejection fraction. History of paraneumonic effusion. Currently on Lasix  with recent dosage adjustment post-discharge. - Continue Lasix  as prescribed: 20 mg twice daily for seven days, then 20 mg daily for 21 days. - Monitor for signs of fluid overload or pleural effusion.  Recording duration: 21 minutes     Almarie LELON Ferrari, NP 07/03/2023

## 2023-07-03 NOTE — Patient Instructions (Addendum)
  VISIT SUMMARY: You had a follow-up appointment today after your recent hospitalization for acute hypoxic respiratory failure due to multifocal pneumonia. You were treated with antibiotics and have shown improvement, though you still experience some shortness of breath. We discussed your ongoing management for COPD, emphysema, and chronic heart failure.  YOUR PLAN: -MULTIFOCAL PNEUMONIA: Multifocal pneumonia is a type of lung infection affecting multiple areas of the lungs. You will continue taking Keflex and Flagyl for two weeks. We will do a chest CT in four weeks to check if the pneumonia has resolved. If there is no improvement, we may consider a bronchoscopy. We also discussed possibly changing your COPD medication if pneumonia recurs. No additional labs needs.   -ACUTE HYPOXIC RESPIRATORY FAILURE: Acute hypoxic respiratory failure occurs when your lungs can't provide enough oxygen to your body. This condition has resolved, but you still need 2 liters of oxygen during exertion. We will monitor your oxygen needs, especially when you are active, and check your oxygen levels during a walking test.  -CHRONIC OBSTRUCTIVE PULMONARY DISEASE (COPD): COPD is a chronic lung disease that makes it hard to breathe. You will continue using Trelegy and Combivent  Respimat as needed. We discussed the potential risk of pneumonia with inhaled steroids, but the benefits currently outweigh the risks. If pneumonia recurs, we may switch your medication.  -EMPHYSEMA: Emphysema is a type of COPD that damages the air sacs in your lungs. You will continue your current COPD management with Trelegy and Combivent  Respimat.  -CHRONIC HEART FAILURE WITH PRESERVED EJECTION FRACTION: Chronic heart failure with preserved ejection fraction means your heart can't pump blood as well as it should, but the heart's pumping ability is still normal. You will continue taking Lasix  as prescribed: 20 mg twice daily for seven days, then 20 mg  daily for 21 days. We will monitor for signs of fluid buildup.  INSTRUCTIONS: Continue taking Keflex and Flagyl for two weeks. We will do a chest CT in four weeks to check if the pneumonia has resolved. Monitor your oxygen needs, especially during activity, and we will check your oxygen levels during a walking test. Continue using Trelegy and Combivent  Respimat as needed. Continue taking Lasix  as prescribed: 20 mg twice daily for seven days, then 20 mg daily for 21 days. Follow up with us  if you notice any signs of fluid buildup or if your symptoms worsen.  Follow-up 6 weeks with Dr. Geronimo- 15 min slot or Landry NP

## 2023-07-09 DIAGNOSIS — I10 Essential (primary) hypertension: Secondary | ICD-10-CM | POA: Diagnosis not present

## 2023-07-09 DIAGNOSIS — K219 Gastro-esophageal reflux disease without esophagitis: Secondary | ICD-10-CM | POA: Diagnosis not present

## 2023-07-09 DIAGNOSIS — N289 Disorder of kidney and ureter, unspecified: Secondary | ICD-10-CM | POA: Diagnosis not present

## 2023-07-09 DIAGNOSIS — J189 Pneumonia, unspecified organism: Secondary | ICD-10-CM | POA: Diagnosis not present

## 2023-07-09 DIAGNOSIS — Z8709 Personal history of other diseases of the respiratory system: Secondary | ICD-10-CM | POA: Diagnosis not present

## 2023-07-09 DIAGNOSIS — J449 Chronic obstructive pulmonary disease, unspecified: Secondary | ICD-10-CM | POA: Diagnosis not present

## 2023-07-15 ENCOUNTER — Ambulatory Visit: Admitting: Primary Care

## 2023-07-18 DIAGNOSIS — E875 Hyperkalemia: Secondary | ICD-10-CM | POA: Diagnosis not present

## 2023-07-29 DIAGNOSIS — J449 Chronic obstructive pulmonary disease, unspecified: Secondary | ICD-10-CM | POA: Diagnosis not present

## 2023-07-29 DIAGNOSIS — I700 Atherosclerosis of aorta: Secondary | ICD-10-CM | POA: Diagnosis not present

## 2023-07-29 DIAGNOSIS — I10 Essential (primary) hypertension: Secondary | ICD-10-CM | POA: Diagnosis not present

## 2023-08-05 ENCOUNTER — Telehealth: Payer: Self-pay

## 2023-08-05 NOTE — Telephone Encounter (Signed)
 Copied from CRM [id number]. Topic: Clinical - Medication Question >> Aug 02, 2023  2:06 PM Rozanna G wrote: Reason for CRM: PT SPOUSE CALLED REQUESTING FOR OXYGEN TO BE PICKED UP WANTED TO KNOW IF [facility] DOESN'T NEED IT CAN THEY PICK IT UP. PLEASE REACH OUT TO PT'S WIFE TO ADVISE.    Spoke with patient and wife - per LOV it states to still use on 2L on exertion  Patient has a follow up in September he will discuss coming off O2     NFN-

## 2023-08-06 DIAGNOSIS — H353123 Nonexudative age-related macular degeneration, left eye, advanced atrophic without subfoveal involvement: Secondary | ICD-10-CM | POA: Diagnosis not present

## 2023-08-06 DIAGNOSIS — H353114 Nonexudative age-related macular degeneration, right eye, advanced atrophic with subfoveal involvement: Secondary | ICD-10-CM | POA: Diagnosis not present

## 2023-08-07 ENCOUNTER — Ambulatory Visit
Admission: RE | Admit: 2023-08-07 | Discharge: 2023-08-07 | Disposition: A | Discharge status: Home / Self Care | Source: Ambulatory Visit | Attending: Primary Care | Admitting: Primary Care

## 2023-08-07 DIAGNOSIS — J189 Pneumonia, unspecified organism: Secondary | ICD-10-CM

## 2023-08-07 DIAGNOSIS — R918 Other nonspecific abnormal finding of lung field: Secondary | ICD-10-CM | POA: Diagnosis not present

## 2023-08-20 ENCOUNTER — Other Ambulatory Visit (HOSPITAL_BASED_OUTPATIENT_CLINIC_OR_DEPARTMENT_OTHER): Payer: Self-pay | Admitting: Pulmonary Disease

## 2023-08-21 ENCOUNTER — Ambulatory Visit: Payer: Self-pay | Admitting: Primary Care

## 2023-08-21 DIAGNOSIS — C61 Malignant neoplasm of prostate: Secondary | ICD-10-CM | POA: Diagnosis not present

## 2023-08-21 NOTE — Progress Notes (Signed)
 Please let patient know CT chest showed improvement in pneumonia bilaterally. There are mild residual ground-glass opacities in the lungs bilaterally.   Follow-up in September

## 2023-08-22 ENCOUNTER — Ambulatory Visit: Admitting: Internal Medicine

## 2023-08-25 ENCOUNTER — Emergency Department (HOSPITAL_BASED_OUTPATIENT_CLINIC_OR_DEPARTMENT_OTHER)
Admission: EM | Admit: 2023-08-25 | Discharge: 2023-08-25 | Disposition: A | Discharge status: Home / Self Care | Attending: Emergency Medicine | Admitting: Emergency Medicine

## 2023-08-25 ENCOUNTER — Encounter (HOSPITAL_BASED_OUTPATIENT_CLINIC_OR_DEPARTMENT_OTHER): Payer: Self-pay

## 2023-08-25 DIAGNOSIS — Z7982 Long term (current) use of aspirin: Secondary | ICD-10-CM | POA: Insufficient documentation

## 2023-08-25 DIAGNOSIS — R220 Localized swelling, mass and lump, head: Secondary | ICD-10-CM | POA: Diagnosis present

## 2023-08-25 DIAGNOSIS — L03211 Cellulitis of face: Secondary | ICD-10-CM | POA: Diagnosis not present

## 2023-08-25 MED ORDER — CEFTRIAXONE SODIUM 500 MG IJ SOLR
500.0000 mg | Freq: Once | INTRAMUSCULAR | Status: AC
  Administered 2023-08-25: 500 mg via INTRAMUSCULAR
  Filled 2023-08-25: qty 500

## 2023-08-25 MED ORDER — DOXYCYCLINE HYCLATE 100 MG PO TABS
100.0000 mg | ORAL_TABLET | Freq: Once | ORAL | Status: AC
  Administered 2023-08-25: 100 mg via ORAL
  Filled 2023-08-25: qty 1

## 2023-08-25 MED ORDER — LIDOCAINE HCL (PF) 1 % IJ SOLN
INTRAMUSCULAR | Status: AC
  Administered 2023-08-25: 5 mL
  Filled 2023-08-25: qty 5

## 2023-08-25 MED ORDER — DOXYCYCLINE HYCLATE 100 MG PO CAPS: 100.0000 mg | ORAL_CAPSULE | Freq: Two times a day (BID) | ORAL | 0 refills | Status: DC

## 2023-08-25 NOTE — ED Notes (Signed)
 Pt given discharge instructions and reviewed prescriptions. Opportunities given for questions. Pt verbalizes understanding. Jillyn Hidden, RN

## 2023-08-25 NOTE — Discharge Instructions (Addendum)
 While you were in the emergency room, you are diagnosed with cellulitis around your eye and forehead.  I think that the swelling that you are seeing in your eye is related to the area on your forehead.  We are treating this with an antibiotic called doxycycline .  You may take this once in the morning and once in the evening beginning tomorrow morning.  Return to the emergency room if you develop increasing pain, swelling, fever, trouble with your vision or pain with moving your eye.  Follow-up with your primary care doctor within 1 week.

## 2023-08-25 NOTE — ED Provider Notes (Signed)
 [place] EMERGENCY DEPARTMENT AT [facility] Provider Note   CSN: [id number] Arrival date & time: 08/25/23  1444     Patient presents with: Facial Swelling   Cody Tate is a 73 y.o. male.   This is a pleasant 73 year old male who is here today after he noticed some swelling around his right eye.  Patient woke up this morning, noticing some swelling beneath his eye.  No trouble with his vision, no pain with movement.  Spoke with his PCP who recommend he come to the ED for evaluation.  No fever, no chills, no history of diabetes.        Prior to Admission medications   Medication Sig Start Date End Date Taking? Authorizing Provider  doxycycline  (VIBRAMYCIN ) 100 MG capsule Take 1 capsule (100 mg total) by mouth 2 (two) times daily. 08/25/23  Yes Mannie Pac T, DO  AFRIN 12 HOUR 0.05 % nasal spray Place 1 spray into both nostrils 2 (two) times daily as needed for congestion.    [provider]  Ascorbic Acid  (VITAMIN C ) 1000 MG tablet Take 1,000 mg by mouth daily. 12/31/18   [provider]  aspirin  EC 81 MG tablet Take 1 tablet (81 mg total) by mouth daily. Swallow whole. Patient not taking: Reported on 07/03/2023 03/21/23   Williams, Evan, PA-C  celecoxib  (CELEBREX ) 200 MG capsule Take 1 capsule (200 mg total) by mouth 2 (two) times daily. 05/28/23 08/26/23  Laurence Locus, DO  chlorhexidine  (HIBICLENS ) 4 % external liquid Apply 1 Application topically daily as needed (boils).    [provider]  Cholecalciferol  (VITAMIN D3) 50 MCG (2000 UT) capsule Take 4,000 Units by mouth daily. 12/22/18   [provider]  COMBIVENT  RESPIMAT 20-100 MCG/ACT AERS respimat INHALE 1 INHALATION BY MOUTH  INTO THE LUNGS EVERY 6 HOURS AS  NEEDED FOR WHEEZING OR SHORTNESS OF BREATH 08/21/23   Hunsucker, Donnice SAUNDERS, MD  diphenhydrAMINE  (BENADRYL  ALLERGY ) 25 MG tablet Take 25 mg by mouth at bedtime.    [provider]  empagliflozin  (JARDIANCE ) 10 MG TABS  tablet Take 1 tablet (10 mg total) by mouth daily. 05/28/23 08/26/23  Laurence Locus, DO  finasteride  (PROSCAR ) 5 MG tablet Take 5 mg by mouth daily. 05/13/20   [provider]  fluticasone  (FLONASE ) 50 MCG/ACT nasal spray Place 1 spray into both nostrils 2 (two) times daily.    [provider]  Fluticasone -Umeclidin-Vilant (TRELEGY ELLIPTA ) 200-62.5-25 MCG/ACT AEPB Inhale 1 puff into the lungs daily.    [provider]  furosemide  (LASIX ) 20 MG tablet Take 1 tablet (20 mg total) by mouth 2 (two) times daily for 7 days, THEN 1 tablet (20 mg total) daily for 21 days. Patient taking differently: Take 20 mg by mouth once daily.  05/28/23 07/03/23  Laurence Locus, DO  gabapentin  (NEURONTIN ) 300 MG capsule Take 1 capsule (300 mg total) by mouth 4 (four) times daily. 05/28/23 08/26/23  Laurence Locus, DO  Guaifenesin  (MUCINEX  MAXIMUM STRENGTH) 1200 MG TB12 Take 1,200 mg by mouth 2 (two) times daily.    [provider]  Lutein-Zeaxanthin 25-5 MG CAPS Take 1 capsule by mouth daily.    [provider]  magic mouthwash w/lidocaine  SOLN Take 15 mLs by mouth 4 (four) times daily as needed for mouth pain. contains equal amounts of Maalox Extra Strength, nystatin, diphenhydramine  and lidocaine . 05/28/23   Laurence Locus, DO  magnesium  hydroxide (MILK OF MAGNESIA) 400 MG/5ML suspension Take 15 mLs by mouth at bedtime.  [provider]  metoprolol  succinate (TOPROL -XL) 100 MG 24 hr tablet Take 1 tablet (100 mg total) by mouth at bedtime. Take with or immediately following a meal. 05/28/23 08/26/23  Laurence Locus, DO  montelukast  (SINGULAIR ) 10 MG tablet TAKE 1 TABLET BY MOUTH AT  BEDTIME 12/21/22   Hunsucker, Donnice SAUNDERS, MD  Multiple Vitamin (MULTIVITAMIN WITH MINERALS) TABS tablet Take 1 tablet by mouth daily.    [provider]  omeprazole  (PRILOSEC) 40 MG capsule Take 1 capsule (40 mg total) by mouth daily before breakfast. 09/20/20   Hunsucker, Donnice SAUNDERS, MD  Oxycodone  HCl 10 MG  TABS Take 10 mg by mouth every 4 (four) hours. 08/13/19   [provider]  Propylene Glycol, PF, (SYSTANE COMPLETE PF) 0.6 % SOLN Place 2 drops into both eyes daily as needed (dry eyes).    [provider]  rosuvastatin  (CRESTOR ) 20 MG tablet Take 1 tablet (20 mg total) by mouth at bedtime. 05/28/23 08/26/23  Laurence Locus, DO  Simethicone  125 MG CAPS Take 250 mg by mouth daily as needed (for gas).    [provider]  Tamsulosin  HCl (FLOMAX ) 0.4 MG CAPS Take 0.4 mg by mouth 2 (two) times daily.    [provider]  telmisartan  (MICARDIS ) 80 MG tablet Take 0.5 tablets (40 mg total) by mouth 2 (two) times daily. 05/28/23 08/26/23  Laurence Locus, DO  timolol  (TIMOPTIC ) 0.5 % ophthalmic solution Place 1 drop into both eyes at bedtime. 10/12/22   [provider]    Allergies: Amlodipine besylate, Aspirin , Other, Tape, and Wound dressing adhesive    Review of Systems  Updated Vital Signs BP (!) 170/73 (BP Location: Left Arm)   Pulse 70   Temp 98.4 F (36.9 C)   Resp 18   SpO2 96%   Physical Exam Vitals and nursing note reviewed.  Constitutional:      Appearance: Normal appearance.  Eyes:     Extraocular Movements: Extraocular movements intact.     Conjunctiva/sclera: Conjunctivae normal.     Pupils: Pupils are equal, round, and reactive to light.     Comments: No proptosis.  Mild right periorbital edema  Musculoskeletal:        General: Normal range of motion.  Neurological:     General: No focal deficit present.     Mental Status: He is alert.     (all labs ordered are listed, but only abnormal results are displayed) Labs Reviewed - No data to display  EKG: None  Radiology: No results found.   Procedures   Medications Ordered in the ED  doxycycline  (VIBRA -TABS) tablet 100 mg (has no administration in time range)  cefTRIAXone  (ROCEPHIN ) injection 500 mg (has no administration in time range)                                    Medical  Decision Making 73 year old male here today with right eye swelling-  Differential diagnoses include cellulitis, less likely periorbital cellulitis, dependent edema.  Plan-on the patient's right forehead, there is a small scab with a trace amount of purulence.  He has been treating this with oral antibiotic cream.  What I suspect is happened, is that he has developed a bit of swelling on the skull, and he has some trace dependent edema below the eye.  This does not appear to be an orbital cellulitis.  Will treat this as a cellulitis.  Will  give the patient dose of doxycycline  here in the ED.  Since it is "Sunday, and his pharmacy is not open, will give a dose of IM Rocephin.  Strict return precautions discussed with the patient and patient's wife at bedside.  Considered imaging, however it would not change management.  Risk Prescription drug management.        Final diagnoses:  Cellulitis of face    ED Discharge Orders          Ordered    doxycycline (VIBRAMYCIN) 100 MG capsule  2 times daily        08" /24/25 1519               Mannie Pac T, DO 08/25/23 1520

## 2023-08-25 NOTE — ED Triage Notes (Signed)
 Patient arrives with right eye swelling. Redness and swelling noticed. PCP suggested he come to be evaluated for orbital cellulitis.

## 2023-08-26 NOTE — Progress Notes (Signed)
 Called the pt and there was no answer- LMTCB

## 2023-08-26 NOTE — Telephone Encounter (Signed)
 Patient and wife returning call regarding CT chest results.  Reviewed all information per Almarie Ferrari, NP.  Patient and wife verbalized understanding.

## 2023-08-28 DIAGNOSIS — R3912 Poor urinary stream: Secondary | ICD-10-CM | POA: Diagnosis not present

## 2023-08-28 DIAGNOSIS — N3941 Urge incontinence: Secondary | ICD-10-CM | POA: Diagnosis not present

## 2023-08-28 DIAGNOSIS — C61 Malignant neoplasm of prostate: Secondary | ICD-10-CM | POA: Diagnosis not present

## 2023-08-28 DIAGNOSIS — R351 Nocturia: Secondary | ICD-10-CM | POA: Diagnosis not present

## 2023-08-28 DIAGNOSIS — N401 Enlarged prostate with lower urinary tract symptoms: Secondary | ICD-10-CM | POA: Diagnosis not present

## 2023-08-28 DIAGNOSIS — N99114 Postprocedural urethral stricture, male, unspecified: Secondary | ICD-10-CM | POA: Diagnosis not present

## 2023-09-01 DIAGNOSIS — I10 Essential (primary) hypertension: Secondary | ICD-10-CM | POA: Diagnosis not present

## 2023-09-01 DIAGNOSIS — J449 Chronic obstructive pulmonary disease, unspecified: Secondary | ICD-10-CM | POA: Diagnosis not present

## 2023-09-01 DIAGNOSIS — I700 Atherosclerosis of aorta: Secondary | ICD-10-CM | POA: Diagnosis not present

## 2023-09-09 DIAGNOSIS — H353231 Exudative age-related macular degeneration, bilateral, with active choroidal neovascularization: Secondary | ICD-10-CM | POA: Diagnosis not present

## 2023-09-11 DIAGNOSIS — L0292 Furuncle, unspecified: Secondary | ICD-10-CM | POA: Diagnosis not present

## 2023-09-11 DIAGNOSIS — I10 Essential (primary) hypertension: Secondary | ICD-10-CM | POA: Diagnosis not present

## 2023-09-11 DIAGNOSIS — Z79899 Other long term (current) drug therapy: Secondary | ICD-10-CM | POA: Diagnosis not present

## 2023-09-11 DIAGNOSIS — I5189 Other ill-defined heart diseases: Secondary | ICD-10-CM | POA: Diagnosis not present

## 2023-09-18 ENCOUNTER — Encounter: Payer: Self-pay | Admitting: Primary Care

## 2023-09-18 ENCOUNTER — Ambulatory Visit: Admitting: Primary Care

## 2023-09-18 VITALS — BP 181/81 | HR 78 | Ht 66.5 in | Wt 222.0 lb

## 2023-09-18 DIAGNOSIS — K909 Intestinal malabsorption, unspecified: Secondary | ICD-10-CM | POA: Insufficient documentation

## 2023-09-18 DIAGNOSIS — I700 Atherosclerosis of aorta: Secondary | ICD-10-CM | POA: Insufficient documentation

## 2023-09-18 DIAGNOSIS — J449 Chronic obstructive pulmonary disease, unspecified: Secondary | ICD-10-CM

## 2023-09-18 DIAGNOSIS — J189 Pneumonia, unspecified organism: Secondary | ICD-10-CM

## 2023-09-18 DIAGNOSIS — R911 Solitary pulmonary nodule: Secondary | ICD-10-CM

## 2023-09-18 DIAGNOSIS — I872 Venous insufficiency (chronic) (peripheral): Secondary | ICD-10-CM | POA: Insufficient documentation

## 2023-09-18 DIAGNOSIS — H353231 Exudative age-related macular degeneration, bilateral, with active choroidal neovascularization: Secondary | ICD-10-CM | POA: Insufficient documentation

## 2023-09-18 DIAGNOSIS — Z860100 Personal history of colon polyps, unspecified: Secondary | ICD-10-CM | POA: Insufficient documentation

## 2023-09-18 DIAGNOSIS — E611 Iron deficiency: Secondary | ICD-10-CM | POA: Insufficient documentation

## 2023-09-18 DIAGNOSIS — H409 Unspecified glaucoma: Secondary | ICD-10-CM | POA: Insufficient documentation

## 2023-09-18 NOTE — Patient Instructions (Addendum)
  VISIT SUMMARY: You had a follow-up appointment today to check on your recovery from multifocal pneumonia. You reported feeling 'pretty good' and have not needed supplemental oxygen recently. Your occasional shortness of breath and cough are consistent with your baseline before your July hospitalization. Your home oxygen levels are stable, and you have no new respiratory symptoms.  YOUR PLAN: -CHRONIC OBSTRUCTIVE PULMONARY DISEASE (COPD): COPD is a chronic lung condition that makes it hard to breathe. You should continue using Trelegy once daily and use your rescue inhaler as needed for congestion. Your mucus is clear to pale yellow, which indicates no infection. Take mucinex  as needed to loosen chest congestion.  -MULTIFOCAL PNEUMONIA, RESOLVED, WITH MILD RESIDUAL GROUND GLASS OPACITIES AND CLUSTERED NODULES LIKELY DUE TO SMALL AIRWAY DISEASE: Multifocal pneumonia is a type of lung infection that affects multiple areas of the lungs. Your pneumonia has resolved, but there are mild residual opacities and new clustered nodules likely due to small airway disease. A chest CT will be ordered between November and February to monitor these areas. You can discontinue home oxygen after confirming stable oxygen levels during a walk test. We will also coordinate with the oxygen supply company to pick up the equipment.  -CHRONIC LEFT LEG SWELLING: Chronic left leg swelling is a long-term condition where your left leg remains swollen. There are no new changes in your condition.  INSTRUCTIONS: Please schedule a chest CT between November and February to monitor your lung condition. You can discontinue home oxygen after a walk test confirms your oxygen levels are stable. We will coordinate with the oxygen supply company to pick up the equipment.  Follow-up Please call and schedule visit 2-3 weeks after CT scan with Dr. Geronimo

## 2023-09-18 NOTE — Progress Notes (Signed)
 @Patient  ID: Cody Tate, male    DOB: 1950-05-20, 73 y.o.   MRN: [id number]  Chief Complaint  Patient presents with   Results    CT results    Referring provider: Charlott Dorn LABOR, *  HPI: 73 year old male, former smoker. PMH significant for aortic valve stenosis, HTN, CHF, COPD, CAP, parapneumonia effusion, GERD, prostate cancer, iron deficiency anemia, hyperlipidemia, obesity.    09/18/2023- Interim hx  Discussed the use of AI scribe software for clinical note transcription with the patient, who gave verbal consent to proceed.  History of Present Illness Cody Tate is a 73 year old male who presents for follow-up of multifocal pneumonia.  He was hospitalized in July for multifocal pneumonia and treated with IV antibiotics followed by oral keflex  and Flagyl . Clinical improvement was noted, but persistent nodules were observed on the left lung. A chest x-ray in August showed continued improvement with mild residual ground glass opacities bilaterally and new clustered nodules in the right middle lobe.  He feels 'pretty good' and has not used supplemental oxygen recently. Occasional shortness of breath is present, which he experienced prior to the pneumonia, and he feels he is back to his baseline before the July admission. He coughs occasionally, producing clear to pale yellow mucus, but denies any foul taste or smell.  He uses Trelegy once daily and finds it helpful. He occasionally uses a rescue inhaler, about two to three times a week, to relieve congestion. His home oxygen levels range from 96 to 99, and he has not recorded any levels in the 80s.  No new respiratory symptoms, foul taste or smell in mucus, and no shortness of breath while sleeping.   Allergies  Allergen Reactions   Amlodipine Besylate Other (See Comments)    fatigue   Aspirin  Other (See Comments)     GI upset   Other Itching, Other (See Comments), Rash and Dermatitis    VICRYL Sutures   Tape  Itching, Other (See Comments) and Rash    Occlusive tape and bandaids   Wound Dressing Adhesive Rash    Immunization History  Administered Date(s) Administered   DTP 06/01/2001   Fluad Quad(high Dose 65+) 09/16/2019   Fluzone Influenza virus vaccine,trivalent (IIV3), split virus 10/24/2022   INFLUENZA, HIGH DOSE SEASONAL PF 10/22/2014, 09/07/2015, 09/17/2016, 10/22/2017   Influenza Split 10/16/2010, 10/08/2012, 10/24/2012, 10/20/2013   Influenza-Unspecified 10/03/2018, 11/02/2022   PFIZER Comirnaty(Gray Top)Covid-19 Tri-Sucrose Vaccine 11/21/2021   PFIZER(Purple Top)SARS-COV-2 Vaccination 02/13/2019, 03/10/2019, 10/05/2019, 04/19/2020   PNEUMOCOCCAL CONJUGATE-20 06/07/2022   Pfizer Covid-19 Vaccine Bivalent Booster 37yrs & up 09/29/2020   Pfizer(Comirnaty)Fall Seasonal Vaccine 12 years and older 04/18/2022, 10/24/2022   Pneumococcal Conjugate-13 10/22/2014   Pneumococcal Polysaccharide-23 02/10/2015   Respiratory Syncytial Virus Vaccine,Recomb Aduvanted(Arexvy) 11/21/2021   Tdap 09/18/2011, 05/17/2012, 06/07/2022   Zoster Recombinant(Shingrix) 08/18/2018   Zoster, Live 08/04/2010, 11/10/2018    Past Medical History:  Diagnosis Date   Acute heart failure with preserved ejection fraction (HCC) 11/12/2022   Arthritis    Back pain    Radiates down both legs   BPH (benign prostatic hyperplasia)    Cancer (HCC) 2022   prostate cancer   COPD (chronic obstructive pulmonary disease) (HCC)    Dyspnea    Enlarged prostate    GERD (gastroesophageal reflux disease)    Glaucoma    H/O hiatal hernia    H/O wheezing    occ inhaler usage   Heart murmur    Hypertension    Lumbar  foraminal stenosis    Macular degeneration    Meningioma (HCC) 2014   Treated with radiation   Neuromuscular disorder (HCC)    Neuropathy bilateral legs   PONV (postoperative nausea and vomiting)    Pulmonary nodules    resolved on 2019 follow up CT   S/P insertion of spinal cord stimulator     Tobacco  History: Social History   Tobacco Use  Smoking Status Former   Current packs/day: 0.00   Average packs/day: 1.5 packs/day for 35.0 years (52.5 ttl pk-yrs)   Types: Cigarettes   Start date: 05/03/1965   Quit date: 05/03/2000   Years since quitting: 23.3  Smokeless Tobacco Never  Tobacco Comments   quit alcohol  73   Counseling given: Not Answered Tobacco comments: quit alcohol  73   Outpatient Medications Prior to Visit  Medication Sig Dispense Refill   AFRIN 12 HOUR 0.05 % nasal spray Place 1 spray into both nostrils 2 (two) times daily as needed for congestion.     Ascorbic Acid  (VITAMIN C ) 1000 MG tablet Take 1,000 mg by mouth daily.     carvedilol (COREG) 6.25 MG tablet Take 6.25 mg by mouth 2 (two) times daily with a meal.     celecoxib  (CELEBREX ) 200 MG capsule Take 200-400 mg by mouth daily.     chlorhexidine  (HIBICLENS ) 4 % external liquid Apply 1 Application topically daily as needed (boils).     Cholecalciferol  (VITAMIN D3) 50 MCG (2000 UT) capsule Take 4,000 Units by mouth daily.     COMBIVENT  RESPIMAT 20-100 MCG/ACT AERS respimat INHALE 1 INHALATION BY MOUTH  INTO THE LUNGS EVERY 6 HOURS AS  NEEDED FOR WHEEZING OR SHORTNESS OF BREATH 12 g 3   diphenhydrAMINE  (BENADRYL  ALLERGY ) 25 MG tablet Take 25 mg by mouth at bedtime.     doxycycline  (VIBRAMYCIN ) 100 MG capsule Take 1 capsule (100 mg total) by mouth 2 (two) times daily. 20 capsule 0   fluticasone  (FLONASE ) 50 MCG/ACT nasal spray Place 1 spray into both nostrils 2 (two) times daily.     Fluticasone -Umeclidin-Vilant (TRELEGY ELLIPTA ) 200-62.5-25 MCG/ACT AEPB Inhale 1 puff into the lungs daily.     furosemide  (LASIX ) 20 MG tablet Take 1 tablet (20 mg total) by mouth 2 (two) times daily for 7 days, THEN 1 tablet (20 mg total) daily for 21 days. (Patient taking differently: Take 20 mg by mouth once daily. ) 90 tablet 0   gabapentin  (NEURONTIN ) 300 MG capsule Take 1 capsule (300 mg total) by mouth 4 (four) times daily. 360 capsule  0   Guaifenesin  (MUCINEX  MAXIMUM STRENGTH) 1200 MG TB12 Take 1,200 mg by mouth 2 (two) times daily.     JARDIANCE  10 MG TABS tablet Take 10 mg by mouth daily.     Lutein-Zeaxanthin 25-5 MG CAPS Take 1 capsule by mouth daily.     magic mouthwash w/lidocaine  SOLN Take 15 mLs by mouth 4 (four) times daily as needed for mouth pain. contains equal amounts of Maalox Extra Strength, nystatin, diphenhydramine  and lidocaine . 1000 mL 0   magnesium  hydroxide (MILK OF MAGNESIA) 400 MG/5ML suspension Take 15 mLs by mouth at bedtime.     montelukast  (SINGULAIR ) 10 MG tablet TAKE 1 TABLET BY MOUTH AT  BEDTIME 90 tablet 3   Multiple Vitamin (MULTIVITAMIN WITH MINERALS) TABS tablet Take 1 tablet by mouth daily.     omeprazole  (PRILOSEC) 40 MG capsule Take 1 capsule (40 mg total) by mouth daily before breakfast. 90 capsule 3   Oxycodone  HCl  10 MG TABS Take 10 mg by mouth every 4 (four) hours.     Propylene Glycol, PF, (SYSTANE COMPLETE PF) 0.6 % SOLN Place 2 drops into both eyes daily as needed (dry eyes).     rosuvastatin  (CRESTOR ) 20 MG tablet Take 1 tablet (20 mg total) by mouth at bedtime. 90 tablet 0   Simethicone  125 MG CAPS Take 250 mg by mouth daily as needed (for gas).     spironolactone  (ALDACTONE ) 25 MG tablet Take 12.5 mg by mouth daily.     Tamsulosin  HCl (FLOMAX ) 0.4 MG CAPS Take 0.4 mg by mouth 2 (two) times daily.     telmisartan  (MICARDIS ) 80 MG tablet Take 0.5 tablets (40 mg total) by mouth 2 (two) times daily. 90 tablet 0   timolol  (TIMOPTIC ) 0.5 % ophthalmic solution Place 1 drop into both eyes at bedtime.     aspirin  EC 81 MG tablet Take 1 tablet (81 mg total) by mouth daily. Swallow whole. (Patient not taking: Reported on 07/03/2023)     finasteride  (PROSCAR ) 5 MG tablet Take 5 mg by mouth daily. (Patient not taking: Reported on 09/18/2023)     metoprolol  succinate (TOPROL -XL) 100 MG 24 hr tablet Take 1 tablet (100 mg total) by mouth at bedtime. Take with or immediately following a meal. 90  tablet 0   No facility-administered medications prior to visit.   Review of Systems  Review of Systems  Constitutional: Negative.   Respiratory:  Positive for cough. Negative for chest tightness and wheezing.        Rare cough with clear mucus     Physical Exam  BP (!) 181/81   Pulse 78   Ht 5' 6.5 (1.689 m)   Wt 222 lb (100.7 kg)   SpO2 95%   BMI 35.29 kg/m  Physical Exam Constitutional:      Appearance: Normal appearance.  Cardiovascular:     Rate and Rhythm: Normal rate and regular rhythm.  Pulmonary:     Effort: Pulmonary effort is normal.     Breath sounds: No wheezing or rhonchi.  Skin:    General: Skin is warm and dry.  Neurological:     General: No focal deficit present.     Mental Status: He is alert and oriented to person, place, and time. Mental status is at baseline.  Psychiatric:        Mood and Affect: Mood normal.        Behavior: Behavior normal.        Thought Content: Thought content normal.        Judgment: Judgment normal.     Lab Results:  CBC    Component Value Date/Time   WBC 9.2 06/29/2023 0616   RBC 4.60 06/29/2023 0616   HGB 13.3 06/29/2023 0616   HCT 41.2 06/29/2023 0616   PLT 256 06/29/2023 0616   MCV 89.6 06/29/2023 0616   MCH 28.9 06/29/2023 0616   MCHC 32.3 06/29/2023 0616   RDW 14.3 06/29/2023 0616   LYMPHSABS 0.7 06/29/2023 0616   MONOABS 1.7 (H) 06/29/2023 0616   EOSABS 0.2 06/29/2023 0616   BASOSABS 0.1 06/29/2023 0616    BMET    Component Value Date/Time   NA 133 (L) 06/29/2023 0616   NA 141 03/21/2023 1307   K 4.1 06/29/2023 0616   CL 94 (L) 06/29/2023 0616   CO2 29 06/29/2023 0616   GLUCOSE 103 (H) 06/29/2023 0616   BUN 18 06/29/2023 0616   BUN 18 03/21/2023 1307  BUN 22.8 08/05/2014 1203   CREATININE 0.73 06/29/2023 0616   CREATININE 1.0 08/05/2014 1203   CALCIUM  9.3 06/29/2023 0616   GFRNONAA >60 06/29/2023 0616   GFRAA 100 01/29/2020 1430    BNP    Component Value Date/Time   BNP 110.7 (H)  06/28/2023 0449    ProBNP    Component Value Date/Time   PROBNP 633.0 (H) 06/25/2023 1724   PROBNP 335 03/21/2023 1307   PROBNP 119.0 (H) 11/15/2022 1450    Imaging: No results found.   Assessment & Plan:    1. Chronic obstructive pulmonary disease, unspecified COPD type (HCC) (Primary)  2. Pulmonary nodule   Assessment and Plan Assessment & Plan Chronic obstructive pulmonary disease (COPD) Currently well controlled on triple therapy. He uses a rescue inhaler two to three times a week for congestion, which is effective. Trelegy is used once daily with benefit. Mucus is clear to pale yellow, indicating no infection. - Continue Trelegy 200mcg once daily - Use Combivent  Respimat 1 puff every 6 hours as needed   Multifocal pneumonia, resolved, with mild residual ground glass opacities and clustered nodules Previously treated for multifocal pneumonia in July with IV antibiotics, followed by oral Keflex  and Flagyl . Clinically improved with persistent nodules on the left lung. Recent chest x-ray shows mild residual ground glass opacities bilaterally and new clustered nodules in the right middle lobe, likely due to small airway disease. Occasional shortness of breath is consistent with baseline. Oxygen levels at home are stable at 96-99%. - Order chest CT between November and February to monitor residual opacities and nodules - Discontinue home oxygen after confirming stable oxygen levels during a walk test - Coordinate with oxygen supply company to pick up equipment   Almarie LELON Ferrari, NP 09/18/2023

## 2023-09-20 ENCOUNTER — Telehealth: Payer: Self-pay

## 2023-09-20 DIAGNOSIS — J449 Chronic obstructive pulmonary disease, unspecified: Secondary | ICD-10-CM

## 2023-09-20 NOTE — Telephone Encounter (Signed)
 Copied from CRM [id number]. Topic: General - Other >> Sep 18, 2023  4:48 PM Rilla B wrote: Patient was discharge from hospital and went home on oxygen. Patient was to call in was he was settled at home and request an order for [employer] come out and get the oxygen. [phone]   Can you advise if OK to discontinue oxygen? Thanks!

## 2023-09-20 NOTE — Telephone Encounter (Signed)
 Called and spoke with the pts wife (DPR) advised order has been placed to d/c oxygen and I have also scheduled pt ov with Dr. Geronimo to review CT in November. Nfn

## 2023-09-20 NOTE — Telephone Encounter (Signed)
 Yes, please enter an order to discontinue oxygen

## 2023-09-25 DIAGNOSIS — I5189 Other ill-defined heart diseases: Secondary | ICD-10-CM | POA: Diagnosis not present

## 2023-09-25 DIAGNOSIS — E611 Iron deficiency: Secondary | ICD-10-CM | POA: Diagnosis not present

## 2023-09-25 DIAGNOSIS — I10 Essential (primary) hypertension: Secondary | ICD-10-CM | POA: Diagnosis not present

## 2023-09-25 DIAGNOSIS — Z79899 Other long term (current) drug therapy: Secondary | ICD-10-CM | POA: Diagnosis not present

## 2023-09-25 DIAGNOSIS — K5901 Slow transit constipation: Secondary | ICD-10-CM | POA: Diagnosis not present

## 2023-09-25 DIAGNOSIS — L0292 Furuncle, unspecified: Secondary | ICD-10-CM | POA: Diagnosis not present

## 2023-09-25 DIAGNOSIS — Z8639 Personal history of other endocrine, nutritional and metabolic disease: Secondary | ICD-10-CM | POA: Diagnosis not present

## 2023-09-25 DIAGNOSIS — Z860100 Personal history of colon polyps, unspecified: Secondary | ICD-10-CM | POA: Diagnosis not present

## 2023-10-01 DIAGNOSIS — J449 Chronic obstructive pulmonary disease, unspecified: Secondary | ICD-10-CM | POA: Diagnosis not present

## 2023-10-01 DIAGNOSIS — I10 Essential (primary) hypertension: Secondary | ICD-10-CM | POA: Diagnosis not present

## 2023-10-01 DIAGNOSIS — E7800 Pure hypercholesterolemia, unspecified: Secondary | ICD-10-CM | POA: Diagnosis not present

## 2023-10-01 DIAGNOSIS — I700 Atherosclerosis of aorta: Secondary | ICD-10-CM | POA: Diagnosis not present

## 2023-10-08 DIAGNOSIS — H353114 Nonexudative age-related macular degeneration, right eye, advanced atrophic with subfoveal involvement: Secondary | ICD-10-CM | POA: Diagnosis not present

## 2023-10-08 DIAGNOSIS — Z23 Encounter for immunization: Secondary | ICD-10-CM | POA: Diagnosis not present

## 2023-10-08 DIAGNOSIS — H353123 Nonexudative age-related macular degeneration, left eye, advanced atrophic without subfoveal involvement: Secondary | ICD-10-CM | POA: Diagnosis not present

## 2023-10-10 DIAGNOSIS — H401331 Pigmentary glaucoma, bilateral, mild stage: Secondary | ICD-10-CM | POA: Diagnosis not present

## 2023-10-10 DIAGNOSIS — H353133 Nonexudative age-related macular degeneration, bilateral, advanced atrophic without subfoveal involvement: Secondary | ICD-10-CM | POA: Diagnosis not present

## 2023-10-14 DIAGNOSIS — E875 Hyperkalemia: Secondary | ICD-10-CM | POA: Diagnosis not present

## 2023-10-18 NOTE — Progress Notes (Deleted)
 Cardiology Office Note   Date:  10/18/2023   ID:  Cody Tate, DOB [birth year], MRN [id number]  PCP:  Charlott Dorn LABOR, MD  Cardiologist:   Maude Emmer, MD   No chief complaint on file.     History of Present Illness:  73 y.o. with history of HTN, former smoker with COPD. TTE 01/30/19 with normal EF mean AV gradient 8 peak 16 mmHg previous small LVOT gradient noted Has had weight gain, dyspnea and some edema RX diuretics BNP normal 01/29/20  No history of CAD with normal myovue 01/10/18 EF 67%  Had uncomplicated nerve stimulator implant with Dr Joshua for failed L1-5 fusion on 01/16/18 Hard to elevate legs due to back pain   Hard to urinate with prostatism and flomax  not helpful He tells me his PSA has been good had biopsy with Dr Elisabeth [facility] Cystoscopy with dilatation of urethral stricture done 09/21/21   Discussed taking lasix  latter in day to help with edema. 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   Chronic LE edema left > right Hospitalized 06/29/23 for respiratory failure. Had pneumonia and strep pneumonia complicated by parapneumonic effusion requiring chest tube. WBC elevated 22.4  Had persistent consolidation in LUL and seen by Dr Geronimo who felt prolonged oral antibiotics needed and possible bronchoscopy in future D/c on 2 L's [place] F/U CT 08/11/23 showed improvement but some clustered nodules in right middle lobe  ***    Past Medical History:  Diagnosis Date   Acute heart failure with preserved ejection fraction (HCC) 11/12/2022   Arthritis    Back pain    Radiates down both legs   BPH (benign prostatic hyperplasia)    Cancer (HCC)  2022   prostate cancer   COPD (chronic obstructive pulmonary disease) (HCC)    Dyspnea    Enlarged prostate    GERD (gastroesophageal reflux disease)    Glaucoma    H/O hiatal hernia    H/O wheezing    occ inhaler usage   Heart murmur    Hypertension    Lumbar foraminal stenosis    Macular degeneration    Meningioma (HCC) 2014   Treated with radiation   Neuromuscular disorder (HCC)    Neuropathy bilateral legs   PONV (postoperative nausea and vomiting)    Pulmonary nodules    resolved on 2019 follow up CT   S/P insertion of spinal cord stimulator     Past Surgical History:  Procedure Laterality Date   ABDOMINAL EXPOSURE N/A 03/16/2022   Procedure: ABDOMINAL EXPOSURE;  Surgeon: Gretta Lonni PARAS, MD;  Location: [facility] OR;  Service: Vascular;  Laterality: N/A;   ANTERIOR LUMBAR FUSION N/A 03/16/2022   Procedure: Anterior Lumbar Interbody Fusion - Lumbar Five-Sacral One with cage;  Surgeon: Louis Shove, MD;  Location: [facility] OR;  Service: Neurosurgery;  Laterality: N/A;   BACK SURGERY  06/08/2006   lam x3 cerv x 1   CERVICAL FUSION     COLONOSCOPY  2020   CYSTOSCOPY     73 yrs old   CYSTOSCOPY WITH URETHRAL DILATATION N/A 09/21/2021   Procedure: CYSTOSCOPY WITH BALLOON DILATION and OPTILUME BALLOON DILATION OF  URETHRAL DILATATION;  Surgeon: Elisabeth Valli BIRCH, MD;  Location: [facility] ORS;  Service: Urology;  Laterality: N/A;   EYE SURGERY     bil   GOLD SEED IMPLANT N/A 09/06/2020   Procedure: GOLD SEED IMPLANT/ PLACEMENT OF FIDUCIAL MARKERS;  Surgeon: Elisabeth Valli BIRCH, MD;  Location: [facility] ORS;  Service: Urology;  Laterality: N/A;   KNEE ARTHROSCOPY  01/02/2008   lft   KNEE ARTHROSCOPY Left 07/23/2012   Procedure: LEFT MEDIAL MENISCAL DEBRIDEMENT AND CHONDROPLASTY;  Surgeon: Dempsey LULLA Moan, MD;  Location: [facility] ORS;  Service: Orthopedics;  Laterality: Left;   LUMBAR FUSION  02/22/2014   DR POOL   PILONIDAL CYST EXCISION     SINUS EXPLORATION     SPACE OAR INSTILLATION N/A 09/06/2020    Procedure: SPACE OAR INSTILLATION;  Surgeon: Elisabeth Valli BIRCH, MD;  Location: [facility] OR[facility];  Service: Urology;  Laterality: N/A;   SPINAL CORD STIMULATOR INSERTION N/A 01/16/2018   Procedure: LUMBAR SPINAL CORD STIMULATOR INSERTION;  Surgeon: Joshua Alm RAMAN, MD;  Location: [facility] OR;  Service: Neurosurgery;  Laterality: N/A;  LUMBAR SPINAL CORD STIMULATOR INSERTION   TRANSURETHRAL RESECTION OF PROSTATE N/A 09/06/2020   Procedure: TRANSURETHRAL RESECTION OF THE PROSTATE (TURP);  Surgeon: Elisabeth Valli BIRCH, MD;  Location: [facility] OR[facility];  Service: Urology;  Laterality: N/A;  2 HRS   WRIST GANGLION EXCISION     rt   WRIST SURGERY  01/02/2007   cyst lft     Current Outpatient Medications  Medication Sig Dispense Refill   AFRIN 12 HOUR 0.05 % nasal spray Place 1 spray into both nostrils 2 (two) times daily as needed for congestion.     Ascorbic Acid  (VITAMIN C ) 1000 MG tablet Take 1,000 mg by mouth daily.     aspirin  EC 81 MG tablet Take 1 tablet (81 mg total) by mouth daily. Swallow whole. (Patient not taking: Reported on 07/03/2023)     carvedilol (COREG) 6.25 MG tablet Take 6.25 mg by mouth 2 (two) times daily with a meal.     celecoxib  (CELEBREX ) 200 MG capsule Take 200-400 mg by mouth daily.     chlorhexidine  (HIBICLENS ) 4 % external liquid Apply 1 Application topically daily as needed (boils).     Cholecalciferol  (VITAMIN D3) 50 MCG (2000 UT) capsule Take 4,000 Units by mouth daily.     COMBIVENT  RESPIMAT 20-100 MCG/ACT AERS respimat INHALE 1 INHALATION BY MOUTH  INTO THE LUNGS EVERY 6 HOURS AS  NEEDED FOR WHEEZING OR SHORTNESS OF BREATH 12 g 3   diphenhydrAMINE  (BENADRYL  ALLERGY ) 25 MG tablet Take 25 mg by mouth at bedtime.     doxycycline  (VIBRAMYCIN ) 100 MG capsule Take 1 capsule (100 mg total) by mouth 2 (two) times daily. 20 capsule 0   finasteride  (PROSCAR ) 5 MG tablet Take 5 mg by mouth daily. (Patient not taking: Reported on 09/18/2023)     fluticasone  (FLONASE ) 50 MCG/ACT nasal spray Place 1 spray  into both nostrils 2 (two) times daily.     Fluticasone -Umeclidin-Vilant (TRELEGY ELLIPTA ) 200-62.5-25 MCG/ACT AEPB Inhale 1 puff into the lungs daily.     furosemide  (LASIX ) 20 MG tablet Take 1 tablet (20 mg total) by mouth 2 (two) times daily for 7 days, THEN 1 tablet (20 mg total) daily for 21 days. (Patient taking differently: Take 20 mg by mouth once daily. ) 90 tablet 0   gabapentin  (NEURONTIN ) 300 MG capsule Take 1 capsule (300 mg total) by mouth 4 (four) times daily. 360 capsule 0   Guaifenesin  (  MUCINEX  MAXIMUM STRENGTH) 1200 MG TB12 Take 1,200 mg by mouth 2 (two) times daily.     JARDIANCE  10 MG TABS tablet Take 10 mg by mouth daily.     Lutein-Zeaxanthin 25-5 MG CAPS Take 1 capsule by mouth daily.     magic mouthwash w/lidocaine  SOLN Take 15 mLs by mouth 4 (four) times daily as needed for mouth pain. contains equal amounts of Maalox Extra Strength, nystatin, diphenhydramine  and lidocaine . 1000 mL 0   magnesium  hydroxide (MILK OF MAGNESIA) 400 MG/5ML suspension Take 15 mLs by mouth at bedtime.     metoprolol  succinate (TOPROL -XL) 100 MG 24 hr tablet Take 1 tablet (100 mg total) by mouth at bedtime. Take with or immediately following a meal. 90 tablet 0   montelukast  (SINGULAIR ) 10 MG tablet TAKE 1 TABLET BY MOUTH AT  BEDTIME 90 tablet 3   Multiple Vitamin (MULTIVITAMIN WITH MINERALS) TABS tablet Take 1 tablet by mouth daily.     omeprazole  (PRILOSEC) 40 MG capsule Take 1 capsule (40 mg total) by mouth daily before breakfast. 90 capsule 3   Oxycodone  HCl 10 MG TABS Take 10 mg by mouth every 4 (four) hours.     Propylene Glycol, PF, (SYSTANE COMPLETE PF) 0.6 % SOLN Place 2 drops into both eyes daily as needed (dry eyes).     rosuvastatin  (CRESTOR ) 20 MG tablet Take 1 tablet (20 mg total) by mouth at bedtime. 90 tablet 0   Simethicone  125 MG CAPS Take 250 mg by mouth daily as needed (for gas).     spironolactone  (ALDACTONE ) 25 MG tablet Take 12.5 mg by mouth daily.     Tamsulosin  HCl (FLOMAX )  0.4 MG CAPS Take 0.4 mg by mouth 2 (two) times daily.     telmisartan  (MICARDIS ) 80 MG tablet Take 0.5 tablets (40 mg total) by mouth 2 (two) times daily. 90 tablet 0   timolol  (TIMOPTIC ) 0.5 % ophthalmic solution Place 1 drop into both eyes at bedtime.     No current facility-administered medications for this visit.    Allergies:   Amlodipine besylate, Aspirin , Other, Tape, and Wound dressing adhesive    Social History:  The patient  reports that he quit smoking about 23 years ago. His smoking use included cigarettes. He started smoking about 58 years ago. He has a 52.5 pack-year smoking history. He has never used smokeless tobacco. He reports that he does not currently use alcohol . He reports that he does not use drugs.   Family History:  The patient's family history includes Bone cancer in his mother; COPD in his maternal uncle; Hypertension in his mother; Stroke (age of onset: 33) in his father.    ROS:  Please see the history of present illness.   Otherwise, review of systems are positive for none.   All other systems are reviewed and negative.    PHYSICAL EXAM: VS:  There were no vitals taken for this visit. , BMI There is no height or weight on file to calculate BMI. Affect appropriate Healthy:  appears stated age HEENT: normal Neck supple with no adenopathy JVP normal no bruits no thyromegaly Lungs diffuse rhonchi and  wheezing and good diaphragmatic motion Heart:  S1/S2 SEM murmur, no rub, gallop or click PMI normal Abdomen: benighn, BS positve, no tenderness, no AAA no bruit.  No HSM or HJR Distal pulses intact with no bruits Plus 2 bilateral edema Neuro non-focal Skin warm and dry Previous lumbar fusion with nerve stimulator in place  EKG:  10/18/2023 NSR rate 88 normal    Recent Labs: 10/30/2022: TSH 0.894 06/25/2023: Pro Brain Natriuretic Peptide 633.0 06/28/2023: B Natriuretic Peptide 110.7 06/29/2023: ALT 32; BUN 18; Creatinine, Ser 0.73; Hemoglobin 13.3;  Magnesium  1.9; Platelets 256; Potassium 4.1; Sodium 133    Lipid Panel    Component Value Date/Time   CHOL 130 05/17/2023 1147   TRIG 66 05/17/2023 1147   HDL 45 05/17/2023 1147   CHOLHDL 2.9 05/17/2023 1147   LDLCALC 71 05/17/2023 1147   LDLDIRECT 83 03/21/2023 1307      Wt Readings from Last 3 Encounters:  09/18/23 222 lb (100.7 kg)  07/03/23 227 lb (103 kg)  06/29/23 225 lb 5 oz (102.2 kg)      Other studies Reviewed:  TTE 11/07/22   IMPRESSIONS     1. Left ventricular ejection fraction, by estimation, is 70 to 75%. The  left ventricle has hyperdynamic function. The left ventricle has no  regional wall motion abnormalities. There is mild left ventricular  hypertrophy. Left ventricular diastolic  parameters are consistent with Grade I diastolic dysfunction (impaired  relaxation). The average left ventricular global longitudinal strain is  -17.8 %. The global longitudinal strain is normal.   2. Right ventricular systolic function is normal. The right ventricular  size is normal.   3. The mitral valve is normal in structure. No evidence of mitral valve  regurgitation. No evidence of mitral stenosis.   4. The aortic valve is tricuspid. There is moderate calcification of the  aortic valve. There is mild thickening of the aortic valve. Aortic valve  regurgitation is not visualized. Aortic valve sclerosis is present, with  no evidence of aortic valve  stenosis. Aortic valve mean gradient measures 7.0 mmHg. Aortic valve Vmax  measures 1.87 m/s.   5. The inferior vena cava is normal in size with greater than 50%  respiratory variability, suggesting right atrial pressure of 3 mmHg.   ASSESSMENT AND PLAN:  1. Abnormal ECG:  Lateral T wave changes ? From HTN non ischemic myovue 2020  2. COPD: hospitalized June 2025 with multifocal strep pneumonia with effusion requiring chest tube. F/u CT August improved. Using 2 L's oxygen by [place]. F/U pulmonary Dr Geronimo 3. HTN:  f/u  [place] Weight loss/ DASH diet consider changing Altace  to Diovan in future 4. HLD  Continue statin labs with Dr Roseann  5. AS:  last echo 11/2022 AV sclerosis mean gradient only 7 mmhg  Recorded just AV sclerosis Observe  6. Back Pain: post nerve stimulator placement Dr Joshua 01/16/18 previous L1-5 fusion  7. Edema:  Dependant from obesity continue diuretics BNP normal  8. Prostate:  PSA normal continue flomax  and proscar  post dilatation of urethral stricture f/u Dr Elisabeth 9. Meningioma:  asymptomatic f/u imaging per neuro    Current medicines are reviewed at length with the patient today.  The patient does not have concerns regarding medicines.  The following changes have been made:   None   Labs/ tests ordered today include:   None    No orders of the defined types were placed in this encounter.    Disposition:   FU with cardiology in a year      Signed, Maude Emmer, MD  10/18/2023 4:27 PM    [facility] [address] [place], [place], [place]  [address] Phone: [phone]; Fax: [phone]

## 2023-10-24 ENCOUNTER — Ambulatory Visit: Admitting: Cardiovascular Disease

## 2023-10-30 ENCOUNTER — Telehealth: Payer: Self-pay

## 2023-10-30 ENCOUNTER — Telehealth (HOSPITAL_BASED_OUTPATIENT_CLINIC_OR_DEPARTMENT_OTHER): Payer: Self-pay

## 2023-10-30 DIAGNOSIS — K5903 Drug induced constipation: Secondary | ICD-10-CM | POA: Diagnosis not present

## 2023-10-30 DIAGNOSIS — I10 Essential (primary) hypertension: Secondary | ICD-10-CM | POA: Diagnosis not present

## 2023-10-30 DIAGNOSIS — I5033 Acute on chronic diastolic (congestive) heart failure: Secondary | ICD-10-CM | POA: Diagnosis not present

## 2023-10-30 DIAGNOSIS — Z86018 Personal history of other benign neoplasm: Secondary | ICD-10-CM | POA: Diagnosis not present

## 2023-10-30 NOTE — Telephone Encounter (Signed)
 Preop tele appt now scheduled, med rec and consent done.

## 2023-10-30 NOTE — Telephone Encounter (Signed)
  Patient Consent for Virtual Visit        Cody Tate has provided verbal consent on 10/30/2023 for a virtual visit (video or telephone).   CONSENT FOR VIRTUAL VISIT FOR:  Cody Tate  By participating in this virtual visit I agree to the following:  I hereby voluntarily request, consent and authorize [facility] and its employed or contracted physicians, physician assistants, nurse practitioners or other licensed health care professionals (the Practitioner), to provide me with telemedicine health care services (the "Services) as deemed necessary by the treating Practitioner. I acknowledge and consent to receive the Services by the Practitioner via telemedicine. I understand that the telemedicine visit will involve communicating with the Practitioner through live audiovisual communication technology and the disclosure of certain medical information by electronic transmission. I acknowledge that I have been given the opportunity to request an in-person assessment or other available alternative prior to the telemedicine visit and am voluntarily participating in the telemedicine visit.  I understand that I have the right to withhold or withdraw my consent to the use of telemedicine in the course of my care at any time, without affecting my right to future care or treatment, and that the Practitioner or I may terminate the telemedicine visit at any time. I understand that I have the right to inspect all information obtained and/or recorded in the course of the telemedicine visit and may receive copies of available information for a reasonable fee.  I understand that some of the potential risks of receiving the Services via telemedicine include:  Delay or interruption in medical evaluation due to technological equipment failure or disruption; Information transmitted may not be sufficient (e.g. poor resolution of images) to allow for appropriate medical decision making by the  Practitioner; and/or  In rare instances, security protocols could fail, causing a breach of personal health information.  Furthermore, I acknowledge that it is my responsibility to provide information about my medical history, conditions and care that is complete and accurate to the best of my ability. I acknowledge that Practitioner's advice, recommendations, and/or decision may be based on factors not within their control, such as incomplete or inaccurate data provided by me or distortions of diagnostic images or specimens that may result from electronic transmissions. I understand that the practice of medicine is not an exact science and that Practitioner makes no warranties or guarantees regarding treatment outcomes. I acknowledge that a copy of this consent can be made available to me via my patient portal [facility] MyChart), or I can request a printed copy by calling the office of [facility].    I understand that my insurance will be billed for this visit.   I have read or had this consent read to me. I understand the contents of this consent, which adequately explains the benefits and risks of the Services being provided via telemedicine.  I have been provided ample opportunity to ask questions regarding this consent and the Services and have had my questions answered to my satisfaction. I give my informed consent for the services to be provided through the use of telemedicine in my medical care

## 2023-10-30 NOTE — Telephone Encounter (Signed)
   Name: Cody Tate  DOB: [birth year]  MRN: [id number]  Primary Cardiologist: Maude Emmer, MD   Preoperative team, please contact this patient and set up a phone call appointment for further preoperative risk assessment. Please obtain consent and complete medication review. Thank you for your help.  I confirm that guidance regarding antiplatelet and oral anticoagulation therapy has been completed and, if necessary, noted below.  Per office protocol, if patient is without any new symptoms or concerns at the time of their virtual visit, he may hold ASA for 7 days prior to procedure. Please resume ASA as soon as possible postprocedure, at the discretion of the surgeon.    I also confirmed the patient resides in the state of [place] . As per [facility] Medical Board telemedicine laws, the patient must reside in the state in which the provider is licensed.   Lamarr Satterfield, NP 10/30/2023, 4:38 PM [facility]

## 2023-10-30 NOTE — Telephone Encounter (Signed)
   Pre-operative Risk Assessment    [name]  DOB: 1950-09-16 MRN: [id number]   Date of last office visit: 03/21/2023 - Artist Pouch, PA-C Date of next office visit: 01/09/2024 - Dr. Delford  Request for Surgical Clearance    Procedure:  colonoscopy  Date of Surgery:  Clearance 11/26/23                                 Surgeon:  Dr. Estefana Keas Surgeon's Group or Practice Name:  [facility]  Phone number:  [phone] Fax number:  [phone]   Type of Clearance Requested:   - Medical  - Pharmacy:  Hold Aspirin  -Does not specify   Type of Anesthesia:  propofol    Additional requests/questions:  History of adenomatous poyps  SignedPatrcia Iverson CROME   10/30/2023, 4:28 PM

## 2023-10-31 DIAGNOSIS — E875 Hyperkalemia: Secondary | ICD-10-CM | POA: Diagnosis not present

## 2023-10-31 DIAGNOSIS — G894 Chronic pain syndrome: Secondary | ICD-10-CM | POA: Diagnosis not present

## 2023-10-31 DIAGNOSIS — Z79899 Other long term (current) drug therapy: Secondary | ICD-10-CM | POA: Diagnosis not present

## 2023-10-31 DIAGNOSIS — I5189 Other ill-defined heart diseases: Secondary | ICD-10-CM | POA: Diagnosis not present

## 2023-10-31 DIAGNOSIS — R232 Flushing: Secondary | ICD-10-CM | POA: Diagnosis not present

## 2023-10-31 DIAGNOSIS — E611 Iron deficiency: Secondary | ICD-10-CM | POA: Diagnosis not present

## 2023-10-31 DIAGNOSIS — I10 Essential (primary) hypertension: Secondary | ICD-10-CM | POA: Diagnosis not present

## 2023-11-01 DIAGNOSIS — I700 Atherosclerosis of aorta: Secondary | ICD-10-CM | POA: Diagnosis not present

## 2023-11-01 DIAGNOSIS — E7800 Pure hypercholesterolemia, unspecified: Secondary | ICD-10-CM | POA: Diagnosis not present

## 2023-11-01 DIAGNOSIS — I10 Essential (primary) hypertension: Secondary | ICD-10-CM | POA: Diagnosis not present

## 2023-11-01 DIAGNOSIS — J449 Chronic obstructive pulmonary disease, unspecified: Secondary | ICD-10-CM | POA: Diagnosis not present

## 2023-11-04 ENCOUNTER — Encounter: Payer: Self-pay | Admitting: Radiology

## 2023-11-05 DIAGNOSIS — H43813 Vitreous degeneration, bilateral: Secondary | ICD-10-CM | POA: Diagnosis not present

## 2023-11-05 DIAGNOSIS — H353231 Exudative age-related macular degeneration, bilateral, with active choroidal neovascularization: Secondary | ICD-10-CM | POA: Diagnosis not present

## 2023-11-05 DIAGNOSIS — H353123 Nonexudative age-related macular degeneration, left eye, advanced atrophic without subfoveal involvement: Secondary | ICD-10-CM | POA: Diagnosis not present

## 2023-11-05 DIAGNOSIS — H401132 Primary open-angle glaucoma, bilateral, moderate stage: Secondary | ICD-10-CM | POA: Diagnosis not present

## 2023-11-05 DIAGNOSIS — H353114 Nonexudative age-related macular degeneration, right eye, advanced atrophic with subfoveal involvement: Secondary | ICD-10-CM | POA: Diagnosis not present

## 2023-11-05 DIAGNOSIS — Z961 Presence of intraocular lens: Secondary | ICD-10-CM | POA: Diagnosis not present

## 2023-11-06 ENCOUNTER — Ambulatory Visit
Admission: RE | Admit: 2023-11-06 | Discharge: 2023-11-06 | Disposition: A | Discharge status: Home / Self Care | Source: Ambulatory Visit | Attending: Primary Care | Admitting: Primary Care

## 2023-11-06 DIAGNOSIS — J439 Emphysema, unspecified: Secondary | ICD-10-CM | POA: Diagnosis not present

## 2023-11-06 DIAGNOSIS — J189 Pneumonia, unspecified organism: Secondary | ICD-10-CM

## 2023-11-08 ENCOUNTER — Telehealth: Payer: Self-pay

## 2023-11-08 NOTE — Telephone Encounter (Signed)
 Copied from CRM #[id number]. Topic: Clinical - Order For Equipment >> Nov 07, 2023  4:31 PM Joesph PARAS wrote: Reason for CRM: Patient is calling to request that his provider write an order to send to [facility]. Patient had oxygen delivered to his home and has not used it. Patient would like for [employer] to come and get the oxygen, as he has no intentions of using it. Patient requesting we write the order to have [employer] come collect the oxygen.    Spoke w/ PT notified that request was made for pickup. VBU.  Sending to [facility] to advise

## 2023-11-08 NOTE — Telephone Encounter (Signed)
 I have re sent the order to [facility]

## 2023-11-12 ENCOUNTER — Ambulatory Visit: Payer: Self-pay | Admitting: Primary Care

## 2023-11-13 ENCOUNTER — Ambulatory Visit: Attending: Student in an Organized Health Care Education/Training Program | Admitting: Student

## 2023-11-13 DIAGNOSIS — Z01810 Encounter for preprocedural cardiovascular examination: Secondary | ICD-10-CM

## 2023-11-13 NOTE — Progress Notes (Signed)
 Pt states he is having SOB, productive cough with yellow phlegm, and no fever as of now but had a low grade fever at the beginning of the week.  Advised pt to keep appt with Ramaswamy.

## 2023-11-13 NOTE — Progress Notes (Signed)
 Virtual Visit via Telephone Note   Because of Cody Tate co-morbid illnesses, he is at least at moderate risk for complications without adequate follow up.  This format is felt to be most appropriate for this patient at this time.  The patient did not have access to video technology/had technical difficulties with video requiring transitioning to audio format only (telephone).  All issues noted in this document were discussed and addressed.  No physical exam could be performed with this format.  Please refer to the patient's chart for his consent to telehealth for [facility].  Evaluation Performed:  Preoperative cardiovascular risk assessment _____________   Date:  11/13/2023   Patient ID:  Cody Tate, DOB 28-Oct-1950, MRN [id number] Patient Location:  Home Provider location:   Office  Primary Care Provider:  Charlott Dorn LABOR, MD  Primary Cardiologist:  [facility] Providers Cardiologist:  Maude Emmer, MD    Chief Complaint / Patient Profile   73 y.o. y/o male with a h/o coronary artery calcification, aortic valve stenosis, chronic HFpEF, hypertension, hyperlipidemia, venous insufficiency, COPD, OSA, GERD, prostate cancer, liver cirrhosis, chronic back pain s/p spinal cord stimulator who is pending colonoscopy by Dr. Kriss on 11/26/2023 and presents today for telephonic preoperative cardiovascular risk assessment.  History of Present Illness    Cody Tate is a 73 y.o. male who presents via audio/video conferencing for a telehealth visit today.  Pt was last seen in cardiology clinic on 03/21/2023 by Artist Pouch, PA-C.  At that time Cody Tate was experiencing some lower extremity edema for which he was maintained on Lasix .  It was felt patient's weight as well as liver cirrhosis could be contributing to edema.  BP was elevated at the time of his visit and he was instructed to monitor BP at home as well as follow-up with Pharm.D.  hypertension clinic.  He was recommended to have sleep study to determine nocturnal oxygen therapy needs.  The patient is now pending procedure as outlined above. Since his last visit, he is doing well. Patient denies shortness of breath, dyspnea on exertion, orthopnea or PND. No chest pain, pressure, or tightness. No palpitations. He has chronic lower extremity edema L>R unchanged from previous. His activity is limited by leg weakness. He ambulates with a walker. He is independent with ADLs, able to do some walking with a walker, and able to climb stairs.    Past Medical History    Past Medical History:  Diagnosis Date   Acute heart failure with preserved ejection fraction (HCC) 11/12/2022   Arthritis    Back pain    Radiates down both legs   BPH (benign prostatic hyperplasia)    Cancer (HCC) 2022   prostate cancer   COPD (chronic obstructive pulmonary disease) (HCC)    Dyspnea    Enlarged prostate    GERD (gastroesophageal reflux disease)    Glaucoma    H/O hiatal hernia    H/O wheezing    occ inhaler usage   Heart murmur    Hypertension    Lumbar foraminal stenosis    Macular degeneration    Meningioma (HCC) 2014   Treated with radiation   Neuromuscular disorder (HCC)    Neuropathy bilateral legs   PONV (postoperative nausea and vomiting)    Pulmonary nodules    resolved on 2019 follow up CT   S/P insertion of spinal cord stimulator    Past Surgical History:  Procedure Laterality Date   ABDOMINAL EXPOSURE  N/A 03/16/2022   Procedure: ABDOMINAL EXPOSURE;  Surgeon: Gretta Lonni PARAS, MD;  Location: [facility] OR;  Service: Vascular;  Laterality: N/A;   ANTERIOR LUMBAR FUSION N/A 03/16/2022   Procedure: Anterior Lumbar Interbody Fusion - Lumbar Five-Sacral One with cage;  Surgeon: Louis Shove, MD;  Location: [facility] OR;  Service: Neurosurgery;  Laterality: N/A;   BACK SURGERY  06/08/2006   lam x3 cerv x 1   CERVICAL FUSION     COLONOSCOPY  2020   CYSTOSCOPY     73 yrs old   CYSTOSCOPY  WITH URETHRAL DILATATION N/A 09/21/2021   Procedure: CYSTOSCOPY WITH BALLOON DILATION and OPTILUME BALLOON DILATION OF  URETHRAL DILATATION;  Surgeon: Elisabeth Valli BIRCH, MD;  Location: [facility] ORS;  Service: Urology;  Laterality: N/A;   EYE SURGERY     bil   GOLD SEED IMPLANT N/A 09/06/2020   Procedure: GOLD SEED IMPLANT/ PLACEMENT OF FIDUCIAL MARKERS;  Surgeon: Elisabeth Valli BIRCH, MD;  Location: [facility] ORS;  Service: Urology;  Laterality: N/A;   KNEE ARTHROSCOPY  01/02/2008   lft   KNEE ARTHROSCOPY Left 07/23/2012   Procedure: LEFT MEDIAL MENISCAL DEBRIDEMENT AND CHONDROPLASTY;  Surgeon: Dempsey LULLA Moan, MD;  Location: [facility] ORS;  Service: Orthopedics;  Laterality: Left;   LUMBAR FUSION  02/22/2014   DR POOL   PILONIDAL CYST EXCISION     SINUS EXPLORATION     SPACE OAR INSTILLATION N/A 09/06/2020   Procedure: SPACE OAR INSTILLATION;  Surgeon: Elisabeth Valli BIRCH, MD;  Location: [facility] ORS;  Service: Urology;  Laterality: N/A;   SPINAL CORD STIMULATOR INSERTION N/A 01/16/2018   Procedure: LUMBAR SPINAL CORD STIMULATOR INSERTION;  Surgeon: Joshua Alm RAMAN, MD;  Location: [facility] OR;  Service: Neurosurgery;  Laterality: N/A;  LUMBAR SPINAL CORD STIMULATOR INSERTION   TRANSURETHRAL RESECTION OF PROSTATE N/A 09/06/2020   Procedure: TRANSURETHRAL RESECTION OF THE PROSTATE (TURP);  Surgeon: Elisabeth Valli BIRCH, MD;  Location: [facility] ORS;  Service: Urology;  Laterality: N/A;  2 HRS   WRIST GANGLION EXCISION     rt   WRIST SURGERY  01/02/2007   cyst lft    Allergies  Allergies  Allergen Reactions   Amlodipine Besylate Other (See Comments)    fatigue   Aspirin  Other (See Comments)     GI upset   Other Itching, Other (See Comments), Rash and Dermatitis    VICRYL Sutures   Tape Itching, Other (See Comments) and Rash    Occlusive tape and bandaids   Wound Dressing Adhesive Rash    Home Medications    Prior to Admission medications   Medication Sig Start Date End Date Taking? Authorizing Provider  AFRIN 12 HOUR  0.05 % nasal spray Place 1 spray into both nostrils 2 (two) times daily as needed for congestion.    [provider]  Ascorbic Acid  (VITAMIN C ) 1000 MG tablet Take 1,000 mg by mouth daily. 12/31/18   [provider]  aspirin  EC 81 MG tablet Take 1 tablet (81 mg total) by mouth daily. Swallow whole. 03/21/23   Williams, Evan, PA-C  carvedilol (COREG) 6.25 MG tablet Take 6.25 mg by mouth 2 (two) times daily with a meal.    [provider]  celecoxib  (CELEBREX ) 200 MG capsule Take 200-400 mg by mouth daily.    [provider]  chlorhexidine  (HIBICLENS ) 4 % external liquid Apply 1 Application topically daily as needed (boils).    [provider]  Cholecalciferol  (VITAMIN D3) 50 MCG (2000 UT) capsule Take 4,000 Units by  mouth daily. 12/22/18   [provider]  COMBIVENT  RESPIMAT 20-100 MCG/ACT AERS respimat INHALE 1 INHALATION BY MOUTH  INTO THE LUNGS EVERY 6 HOURS AS  NEEDED FOR WHEEZING OR SHORTNESS OF BREATH 08/21/23   Hunsucker, Donnice SAUNDERS, MD  diphenhydrAMINE  (BENADRYL  ALLERGY ) 25 MG tablet Take 25 mg by mouth at bedtime.    [provider]  doxycycline  (VIBRAMYCIN ) 100 MG capsule Take 1 capsule (100 mg total) by mouth 2 (two) times daily. 08/25/23   Mannie Pac T, DO  finasteride  (PROSCAR ) 5 MG tablet Take 5 mg by mouth daily. 05/13/20   [provider]  fluticasone  (FLONASE ) 50 MCG/ACT nasal spray Place 1 spray into both nostrils 2 (two) times daily.    [provider]  Fluticasone -Umeclidin-Vilant (TRELEGY ELLIPTA ) 200-62.5-25 MCG/ACT AEPB Inhale 1 puff into the lungs daily.    [provider]  furosemide  (LASIX ) 20 MG tablet Take 1 tablet (20 mg total) by mouth 2 (two) times daily for 7 days, THEN 1 tablet (20 mg total) daily for 21 days. Patient taking differently: Take 20 mg by mouth once daily.  05/28/23 10/30/23  Laurence Locus, DO  gabapentin  (NEURONTIN ) 300 MG capsule Take 1 capsule (300 mg total) by mouth 4  (four) times daily. 05/28/23 10/30/23  Laurence Locus, DO  Guaifenesin  (MUCINEX  MAXIMUM STRENGTH) 1200 MG TB12 Take 1,200 mg by mouth 2 (two) times daily.    [provider]  JARDIANCE  10 MG TABS tablet Take 10 mg by mouth daily.    [provider]  Lutein-Zeaxanthin 25-5 MG CAPS Take 1 capsule by mouth daily.    [provider]  magic mouthwash w/lidocaine  SOLN Take 15 mLs by mouth 4 (four) times daily as needed for mouth pain. contains equal amounts of Maalox Extra Strength, nystatin, diphenhydramine  and lidocaine . 05/28/23   Laurence Locus, DO  magnesium  hydroxide (MILK OF MAGNESIA) 400 MG/5ML suspension Take 15 mLs by mouth at bedtime.    [provider]  metoprolol  succinate (TOPROL -XL) 100 MG 24 hr tablet Take 1 tablet (100 mg total) by mouth at bedtime. Take with or immediately following a meal. 05/28/23 10/30/23  Laurence Locus, DO  montelukast  (SINGULAIR ) 10 MG tablet TAKE 1 TABLET BY MOUTH AT  BEDTIME 12/21/22   Hunsucker, Donnice SAUNDERS, MD  Multiple Vitamin (MULTIVITAMIN WITH MINERALS) TABS tablet Take 1 tablet by mouth daily.    [provider]  omeprazole  (PRILOSEC) 40 MG capsule Take 1 capsule (40 mg total) by mouth daily before breakfast. 09/20/20   Hunsucker, Donnice SAUNDERS, MD  Oxycodone  HCl 10 MG TABS Take 10 mg by mouth every 4 (four) hours. 08/13/19   [provider]  Propylene Glycol, PF, (SYSTANE COMPLETE PF) 0.6 % SOLN Place 2 drops into both eyes daily as needed (dry eyes).    [provider]  rosuvastatin  (CRESTOR ) 20 MG tablet Take 1 tablet (20 mg total) by mouth at bedtime. 05/28/23 10/30/23  Laurence Locus, DO  Simethicone  125 MG CAPS Take 250 mg by mouth daily as needed (for gas).    [provider]  spironolactone  (ALDACTONE ) 25 MG tablet Take 12.5 mg by mouth daily. 09/11/23   [provider]  Tamsulosin  HCl (FLOMAX ) 0.4 MG CAPS Take 0.4 mg by mouth 2 (two) times daily.    [provider]  telmisartan  (MICARDIS )  80 MG tablet Take 0.5 tablets (40 mg total) by mouth 2 (two) times daily. 05/28/23 10/30/23  Laurence Locus, DO  timolol  (TIMOPTIC ) 0.5 % ophthalmic solution Place 1  drop into both eyes at bedtime. 10/12/22   [provider]    Physical Exam    Vital Signs:  Cody Tate does not have vital signs available for review today.  Given telephonic nature of communication, physical exam is limited. AAOx3. NAD. Normal affect.  Speech and respirations are unlabored.  Accessory Clinical Findings    Cardiac Studies & Procedures   ______________________________________________________________________________________________    ECHOCARDIOGRAM  ECHOCARDIOGRAM COMPLETE 11/07/2022  Narrative ECHOCARDIOGRAM REPORT    Patient Name:   Cody Tate Date of Exam: 11/07/2022 Medical Rec #:  [id number]        Height:       66.5 in Accession #:    [id number]       Weight:       234.2 lb Date of Birth:  Oct 09, 1950         BSA:          2.151 m Patient Age:    72 years         BP:           137/61 mmHg Patient Gender: M                HR:           65 bpm. Exam Location:  Outpatient  Procedure: 2D Echo, 3D Echo, Color Doppler, Cardiac Doppler and Strain Analysis  Indications:    Dyspnea  History:        Patient has prior history of Echocardiogram examinations, most recent 06/27/2020. COPD; Risk Factors:Hypertension, Former Smoker and Dyslipidemia.  Sonographer:    Orvil Holmes RDCS Referring Phys: [id number] ARTIST POUCH  IMPRESSIONS   1. Left ventricular ejection fraction, by estimation, is 70 to 75%. The left ventricle has hyperdynamic function. The left ventricle has no regional wall motion abnormalities. There is mild left ventricular hypertrophy. Left ventricular diastolic parameters are consistent with Grade I diastolic dysfunction (impaired relaxation). The average left ventricular global longitudinal strain is -17.8 %. The global longitudinal strain is normal. 2. Right  ventricular systolic function is normal. The right ventricular size is normal. 3. The mitral valve is normal in structure. No evidence of mitral valve regurgitation. No evidence of mitral stenosis. 4. The aortic valve is tricuspid. There is moderate calcification of the aortic valve. There is mild thickening of the aortic valve. Aortic valve regurgitation is not visualized. Aortic valve sclerosis is present, with no evidence of aortic valve stenosis. Aortic valve mean gradient measures 7.0 mmHg. Aortic valve Vmax measures 1.87 m/s. 5. The inferior vena cava is normal in size with greater than 50% respiratory variability, suggesting right atrial pressure of 3 mmHg.  FINDINGS Left Ventricle: Left ventricular ejection fraction, by estimation, is 70 to 75%. The left ventricle has hyperdynamic function. The left ventricle has no regional wall motion abnormalities. The average left ventricular global longitudinal strain is -17.8 %. The global longitudinal strain is normal. The left ventricular internal cavity size was normal in size. There is mild left ventricular hypertrophy. Left ventricular diastolic parameters are consistent with Grade I diastolic dysfunction (impaired relaxation).  Right Ventricle: The right ventricular size is normal. No increase in right ventricular wall thickness. Right ventricular systolic function is normal.  Left Atrium: Left atrial size was normal in size.  Right Atrium: Right atrial size was normal in size.  Pericardium: There is no evidence of pericardial effusion.  Mitral Valve: The mitral valve is normal in structure. No evidence of mitral valve regurgitation. No evidence of  mitral valve stenosis.  Tricuspid Valve: The tricuspid valve is normal in structure. Tricuspid valve regurgitation is not demonstrated. No evidence of tricuspid stenosis.  Aortic Valve: The aortic valve is tricuspid. There is moderate calcification of the aortic valve. There is mild thickening of  the aortic valve. Aortic valve regurgitation is not visualized. Aortic valve sclerosis is present, with no evidence of aortic valve stenosis. Aortic valve mean gradient measures 7.0 mmHg. Aortic valve peak gradient measures 14.0 mmHg. Aortic valve area, by VTI measures 2.99 cm.  Pulmonic Valve: The pulmonic valve was normal in structure. Pulmonic valve regurgitation is not visualized. No evidence of pulmonic stenosis.  Aorta: The aortic root is normal in size and structure.  Venous: The inferior vena cava is normal in size with greater than 50% respiratory variability, suggesting right atrial pressure of 3 mmHg.  IAS/Shunts: No atrial level shunt detected by color flow Doppler.   LEFT VENTRICLE PLAX 2D LVIDd:         3.92 cm   Diastology LVIDs:         1.83 cm   LV e' medial:    5.77 cm/s LV PW:         1.20 cm   LV E/e' medial:  21.0 LV IVS:        1.11 cm   LV e' lateral:   6.85 cm/s LVOT diam:     2.20 cm   LV E/e' lateral: 17.7 LV SV:         119 LV SV Index:   55        2D Longitudinal Strain LVOT Area:     3.80 cm  2D Strain GLS Avg:     -17.8 %  3D Volume EF: 3D EF:        76 % LV EDV:       159 ml LV ESV:       38 ml LV SV:        121 ml  RIGHT VENTRICLE RV Basal diam:  3.80 cm RV Mid diam:    3.07 cm RV S prime:     14.50 cm/s  LEFT ATRIUM             Index        RIGHT ATRIUM           Index LA diam:        3.60 cm 1.67 cm/m   RA Area:     14.50 cm LA Vol (A2C):   34.3 ml 15.95 ml/m  RA Volume:   32.10 ml  14.93 ml/m LA Vol (A4C):   27.0 ml 12.55 ml/m LA Biplane Vol: 31.4 ml 14.60 ml/m AORTIC VALVE AV Area (Vmax):    3.27 cm AV Area (Vmean):   3.04 cm AV Area (VTI):     2.99 cm AV Vmax:           187.00 cm/s AV Vmean:          125.000 cm/s AV VTI:            0.398 m AV Peak Grad:      14.0 mmHg AV Mean Grad:      7.0 mmHg LVOT Vmax:         161.00 cm/s LVOT Vmean:        100.000 cm/s LVOT VTI:          0.313 m LVOT/AV VTI ratio:  0.79  AORTA Ao Root diam: 3.40 cm Ao Asc diam:  3.30 cm  MITRAL VALVE                TRICUSPID VALVE MV Area (PHT): 2.73 cm     TR Peak grad:   11.8 mmHg MV Decel Time: 278 msec     TR Vmax:        172.00 cm/s MR Peak grad: 106.9 mmHg MR Vmax:      517.00 cm/s   SHUNTS MV E velocity: 121.00 cm/s  Systemic VTI:  0.31 m MV A velocity: 162.00 cm/s  Systemic Diam: 2.20 cm MV E/A ratio:  0.75  Oneil Parchment MD Electronically signed by Oneil Parchment MD Signature Date/Time: 11/07/2022/2:33:39 PM    Final          ______________________________________________________________________________________________       Assessment & Plan    Preoperative cardiovascular risk assessment.  Colonoscopy by Dr. Kriss on 11/26/2023.  Chart reviewed as part of pre-operative protocol coverage. According to the RCRI, patient has a 0.9% risk of MACE. Patient reports activity equivalent to 4.31 METS (Per [facility]).   Given past medical history and time since last visit, based on ACC/AHA guidelines, Cody Tate would be at acceptable risk for the planned procedure without further cardiovascular testing.   Patient was advised that if he develops new symptoms prior to surgery to contact our office to arrange a follow-up appointment.  he verbalized understanding.  Ideally aspirin  should be continued without interruption, however if the bleeding risk is too great, aspirin  may be held for 5-7 days prior to surgery. Please resume aspirin  post operatively when it is felt to be safe from a bleeding standpoint.    I will route this recommendation to the requesting party via Epic fax function.  Please call with questions.  Time:   Today, I have spent 5 minutes with the patient with telehealth technology discussing medical history, symptoms, and management plan.     Barnie Hila, NP  11/13/2023, 9:29 AM

## 2023-11-14 MED ORDER — CIPROFLOXACIN HCL 500 MG PO TABS: 500.0000 mg | ORAL_TABLET | Freq: Two times a day (BID) | ORAL | 0 refills | Status: DC

## 2023-11-15 ENCOUNTER — Ambulatory Visit: Payer: Self-pay | Admitting: Primary Care

## 2023-11-15 NOTE — Telephone Encounter (Signed)
 Copied from CRM [id number]. Topic: Clinical - Lab/Test Results >> Nov 15, 2023  1:57 PM Leila C wrote: Reason for CRM: Patient [phone] is returning the office/Ashlyn's call no details was left. Unable to reach CAL, several times. Please advise. Answer Assessment - Initial Assessment Questions 1. REASON FOR CALL: What is the main reason for your call? or How can I best help you?     Patient returning call to [facility] regarding recommendations from CT Chest. Relayed below message with understanding and no further questions   Based on sputum sample I will send in cipro 500mg  twice daily x 7 days Monitor blood sugars closely while on abx, could cause hypoglycemia with jardiance   Keep apt with Dr. Geronimo  Protocols used: Information Only Call - No Triage-A-AH

## 2023-11-18 NOTE — Telephone Encounter (Signed)
 I called and spoke to pt's wife, June. DPR.  June stated that they did pick up the atx and will keep tomorrow's appt. June also stated that they had no way of checking his blood sugar at home.

## 2023-11-18 NOTE — Progress Notes (Signed)
 See other encounter. NFN

## 2023-11-19 ENCOUNTER — Encounter: Payer: Self-pay | Admitting: Internal Medicine

## 2023-11-19 ENCOUNTER — Other Ambulatory Visit: Payer: Self-pay | Admitting: Pulmonary Disease

## 2023-11-19 ENCOUNTER — Ambulatory Visit: Admitting: Internal Medicine

## 2023-11-19 VITALS — BP 143/78 | HR 77 | Temp 97.9°F | Ht 66.0 in | Wt 220.2 lb

## 2023-11-19 DIAGNOSIS — R918 Other nonspecific abnormal finding of lung field: Secondary | ICD-10-CM

## 2023-11-19 DIAGNOSIS — J189 Pneumonia, unspecified organism: Secondary | ICD-10-CM

## 2023-11-19 DIAGNOSIS — Z8719 Personal history of other diseases of the digestive system: Secondary | ICD-10-CM | POA: Diagnosis not present

## 2023-11-19 DIAGNOSIS — J449 Chronic obstructive pulmonary disease, unspecified: Secondary | ICD-10-CM

## 2023-11-19 LAB — SEDIMENTATION RATE: Sed Rate: 37 mm/h — ABNORMAL HIGH (ref 0–20)

## 2023-11-19 NOTE — Progress Notes (Signed)
 OV 11/15/2022  Subjective:  Patient ID: Cody Tate, male , DOB: 1950/09/07 , age 73 y.o. , MRN: [id number] , ADDRESS: [address] [place] [address] PCP Charlott Dorn LABOR, MD Patient Care Team: Charlott Dorn LABOR, MD as PCP - General (Internal Medicine) Delford Maude BROCKS, MD as PCP - Cardiology (Cardiology) Elisabeth Valli BIRCH, MD as Consulting Physician (Urology) Patrcia Cough, MD as Consulting Physician (Radiation Oncology) Crawford, Morna Pickle, NP as Nurse Practitioner (Hematology and Oncology) Starla Wendelyn BIRCH, RN as Registered Nurse  This Provider for this visit: Treatment Team:  Attending Provider: Geronimo Amel, MD    11/15/2022 -   Chief Complaint  Patient presents with   Acute Visit    Increased SOB over the past 2 months. He states his breathing is worse esp when he wakes up in the am.      HPI Cody Tate 73 y.o. -follow-up 5 2 pack smoker.  Acute visit.  He does not have a chest x-ray or CT scan for more than 5 years in the health system here he is here for an acute visit.  But is a transfer of care from Dr. Annella to Dr. Geronimo.  This is because his close friend was Mr. Norleen Mate who was my patient with IPF and who subsequently died from it.  He is here with his wife.  He tells me that it was booked as an acute visit because he really thought he had an appointment today but he did not have an appointment.  He tells me that he has had chronic sinus congestion going on for several years.  His sputum is white or yellow because of the sinus drainage.  But he also has chronic shortness of breath insidious onset getting worse since spring 2024.  Particularly after some cardiac medication was changed.  No associated chest pain.  He has had detailed cardiac workup.  His last imaging was many years ago of the chest.  But he is a past heavy smoker.   He has significant obesity.  He was in the [employer]  He has a long back scar from  surgery.  This extends into the thoracic lumbar and sacral spine.   Feno 5 ppbc   Cardiac stress test January 2020 -- Nuclear stress EF: 67%. The left ventricular ejection fraction is hyperdynamic (>65%). Baseline EKG showed NSR with inferolateral T wave inversions. No changes with Lexiscan  infusions. The study is normal. This is a low risk study.  Echocardiogram November 2024 - Grade 1 diastolic dysfunction   Blood work - BNP 10/30/2022 elevated 543 and then improved to 227 in November 2024. -Normal hemoglobin March 2024  PFT -he had 1 on January 20, 2013 that shows moderate obstruction with an FEV1 of 2.2 L / 75% with a ratio of 65.      No data to display             74/15/2025 Discussed the use of AI scribe software for clinical note transcription with the patient, who gave verbal consent to proceed.  History of Present Illness   The patient, with a history of emphysema, coronary artery disease, and diastolic dysfunction, presented for a follow-up visit after a recent shortness of breath episode. The patient had undergone several tests, including lab work, HRCT scan, and a PET scan. The lab results indicated a mild elevation in BNP, suggesting possible fluid retention due to heart function, and positive allergy  results for various grasses. The  patient's alpha 1 level was normal, ruling out a genetic predisposition for COPD.  The PET scan showed residual post-infectious or inflammatory scarring in the right upper lobe of the lung, likely due to a recent respiratory infection. The patient reported a history of coughing and mucus production a few months prior, which has since resolved. The PET scan also revealed evidence of previous radiation to the prostate but no signs of metastatic disease.  The patient's HRCT scan showed emphysema and consolidation in the right upper lobe, which has improved and left some scarring. The patient also has coronary artery calcification,  indicating some plaque buildup, and an irregular liver margin, suggesting possible cirrhosis. The patient reported a history of moderate alcohol  consumption years ago.  The patient is currently on a regimen of Trelegy for maintenance and Combivent  Respimat as a rescue inhaler for his emphysema. The frequency of Combivent  use varies, sometimes not needed, other times used once or twice a day. The patient also takes Lasix  and Jardiance  for heart-related issues and Simvastatin  for cholesterol management.  The patient reported some issues with fluid retention, which fluctuates and affects his mobility. He also expressed a desire for improved breathing, which may be affected by the fluid retention. The patient does not currently use oxygen and has not had a breathing test since 2015.      OV 04/23/2023  Subjective:  Patient ID: Cody Tate, male , DOB: [birth year] , age 73 y.o. , MRN: [id number] , ADDRESS: [address] [place] [address] PCP Charlott Dorn LABOR, MD Patient Care Team: Charlott Dorn LABOR, MD as PCP - General (Internal Medicine) Delford Maude BROCKS, MD as PCP - Cardiology (Cardiology) Elisabeth Valli BIRCH, MD as Consulting Physician (Urology) Patrcia Cough, MD as Consulting Physician (Radiation Oncology) Crawford, Morna Pickle, NP as Nurse Practitioner (Hematology and Oncology) Starla Wendelyn BIRCH, RN as Registered Nurse  This Provider for this visit: Treatment Team:  Attending Provider: Geronimo Amel, MD    04/23/2023 -   Chief Complaint  Patient presents with   Acute Visit    Increased cough x 3 days. He has had some yellow sputum. Breathing has been slightly worse. He mowed grass last wk without a mask.      HPI Cody Tate 73 y.o. -returns for follow-up for his COPD associated with right upper lobe lung density/scarring [seen in November 2024 with improvement and PET scan December 2024].  He says a week ago he is here with his wife.  He says a week  ago/16/25 was mowing the grass has been outdoors then from 04/21/2023 is having cough congestion yellow sputum wheezing.  All this has gotten better.  Today was having a low-grade fever.  He is still not back to baseline still having the symptoms.  Of note he did have a PET scan in December 2020 for additional improvement there but still the significant size of the scar tissue is partially compared to 2019 when it was all clear.  I would recommend a follow-up CT.  He has upcoming pulmonary function test May 2025.  Of note he is on Trelegy but he prefers Breztri .  We gave him a sample and will send a prescription.   IMPRESSION:  CHEST: Residual platelike opacity in the posterior right upper lobe (series 7/image 33), suggesting post infectious inflammatory scarring. Prior cavitary right perihilar lesion is no longer visualized. No suspicious pulmonary nodules.    Residual post infectious/inflammatory scarring in the right upper lobe.   Diminutive prostate with  adjacent fiducial markers, suggesting radiation changes.   No findings suspicious for metastatic disease.     Electronically Signed   By: Pinkie Pebbles M.D.   On: 01/10/2023 01:13      OV 11/19/2023  Subjective:  Patient ID: Cody Tate, male , DOB: December 11, 1950 , age 82 y.o. , MRN: [id number] , ADDRESS: [address] [place] [address] PCP Charlott Dorn LABOR, MD Patient Care Team: Charlott Dorn LABOR, MD as PCP - General (Internal Medicine) Delford Maude BROCKS, MD as PCP - Cardiology (Cardiology) Elisabeth Valli BIRCH, MD as Consulting Physician (Urology) Patrcia Cough, MD as Consulting Physician (Radiation Oncology) Crawford, Morna Pickle, NP as Nurse Practitioner (Hematology and Oncology) Starla Wendelyn BIRCH, RN as Registered Nurse  This Provider for this visit: Treatment Team:  Attending Provider: Geronimo Amel, MD    11/19/2023 -   Chief Complaint  Patient presents with   COPD    Pt states  since LOV breathing has been about the same SOB w/ exertion       HPI Cody Tate 73 y.o. -Cody Tate is a 73 year old male who presents for follow-up of his pulmonary condition.  He was hospitalized in the spring and again in June for pneumonia. Since June, he has not had any further hospitalizations. He has had two follow-up visits in the clinic. A CT scan was performed on November 06, 2023, and compared to previous scans from earlier in the year and August. He has not undergone a lung biopsy or bronchoscopy.  In August, he visited the emergency room due to a forehead bruise that caused swelling and a black eye. There have been no surgeries reported.  He experienced vomiting once on a Sunday evening, which he suspects might have been related to taking medication on an empty stomach. No nausea, diarrhea, fever, or chills. He has acid reflux, which is controlled with omeprazole  as long as he eats early.  He has an allergy  to grass and mows the lawn, which may require wearing a mask. He denies having any birds or feather items in the house.    CT Chest data from date: Nov 2025  - personally visualized and independently interpreted : yes - my findings are: as below but   IMPRESSION: 1. Worsening multifocal pneumonia with increasing patchy ground-glass infiltrates in the left greater than right upper lobes and in the left lower lobe and lingula, with underlying nodular airspace disease in the left lower lobe and lingula. 2. Increased bronchial wall thickening in the lower lobes and right middle lobe. 3. Unchanged tree-in-bud opacities in the right middle lobe base, consistent with chronic small airways disease. 4. Follow-up study recommended in 2 to 3 months to ensure clearing. 5. Aortic and coronary artery atherosclerosis.   Electronically signed by: Francis Quam MD 11/12/2023 12:17 AM EST RP Workstation: [id number]   RADIOLOGY Chest CT (11/06/2023): Patchy opacities,  particularly on the right side, worsening compared to August 2025 but improved from May and June 2025. Chest CT (August 2025): Improvement in patchy opacities compared to June 2025. Chest CT (June 2025): Improvement in patchy opacities compared to May 2025. PFT      No data to display             LAB RESULTS last 96 hours No results found.       has a past medical history of Acute heart failure with preserved ejection fraction (HCC) (11/12/2022), Arthritis, Back pain, BPH (benign prostatic hyperplasia), Cancer (HCC) (2022),  COPD (chronic obstructive pulmonary disease) (HCC), Dyspnea, Enlarged prostate, GERD (gastroesophageal reflux disease), Glaucoma, H/O hiatal hernia, H/O wheezing, Heart murmur, Hypertension, Lumbar foraminal stenosis, Macular degeneration, Meningioma (HCC) (2014), Neuromuscular disorder (HCC), PONV (postoperative nausea and vomiting), Pulmonary nodules, and S/P insertion of spinal cord stimulator.   reports that he quit smoking about 23 years ago. His smoking use included cigarettes. He started smoking about 58 years ago. He has a 52.5 pack-year smoking history. He has never used smokeless tobacco.  Past Surgical History:  Procedure Laterality Date   ABDOMINAL EXPOSURE N/A 03/16/2022   Procedure: ABDOMINAL EXPOSURE;  Surgeon: Gretta Lonni PARAS, MD;  Location: [facility] OR;  Service: Vascular;  Laterality: N/A;   ANTERIOR LUMBAR FUSION N/A 03/16/2022   Procedure: Anterior Lumbar Interbody Fusion - Lumbar Five-Sacral One with cage;  Surgeon: Louis Shove, MD;  Location: [facility] OR;  Service: Neurosurgery;  Laterality: N/A;   BACK SURGERY  06/08/2006   lam x3 cerv x 1   CERVICAL FUSION     COLONOSCOPY  2020   CYSTOSCOPY     73 yrs old   CYSTOSCOPY WITH URETHRAL DILATATION N/A 09/21/2021   Procedure: CYSTOSCOPY WITH BALLOON DILATION and OPTILUME BALLOON DILATION OF  URETHRAL DILATATION;  Surgeon: Elisabeth Valli BIRCH, MD;  Location: [facility] ORS;  Service: Urology;  Laterality: N/A;    EYE SURGERY     bil   GOLD SEED IMPLANT N/A 09/06/2020   Procedure: GOLD SEED IMPLANT/ PLACEMENT OF FIDUCIAL MARKERS;  Surgeon: Elisabeth Valli BIRCH, MD;  Location: [facility] ORS;  Service: Urology;  Laterality: N/A;   KNEE ARTHROSCOPY  01/02/2008   lft   KNEE ARTHROSCOPY Left 07/23/2012   Procedure: LEFT MEDIAL MENISCAL DEBRIDEMENT AND CHONDROPLASTY;  Surgeon: Dempsey LULLA Moan, MD;  Location: [facility] ORS;  Service: Orthopedics;  Laterality: Left;   LUMBAR FUSION  02/22/2014   DR POOL   PILONIDAL CYST EXCISION     SINUS EXPLORATION     SPACE OAR INSTILLATION N/A 09/06/2020   Procedure: SPACE OAR INSTILLATION;  Surgeon: Elisabeth Valli BIRCH, MD;  Location: [facility] OR[facility];  Service: Urology;  Laterality: N/A;   SPINAL CORD STIMULATOR INSERTION N/A 01/16/2018   Procedure: LUMBAR SPINAL CORD STIMULATOR INSERTION;  Surgeon: Joshua Alm RAMAN, MD;  Location[facility];  Service: Neurosurgery;  Laterality: N/A;  LUMBAR SPINAL CORD STIMULATOR INSERTION   TRANSURETHRAL RESECTION OF PROSTATE N/A 09/06/2020   Procedure: TRANSURETHRAL RESECTION OF THE PROSTATE (TURP);  Surgeon: Elisabeth Valli BIRCH, MD;  Location: [facility] ORS;  Service: Urology;  Laterality: N/A;  2 HRS   WRIST GANGLION EXCISION     rt   WRIST SURGERY  01/02/2007   cyst lft    Allergies  Allergen Reactions   Amlodipine Besylate Other (See Comments)    fatigue   Aspirin  Other (See Comments)     GI upset   Other Itching, Other (See Comments), Rash and Dermatitis    VICRYL Sutures   Tape Itching, Other (See Comments) and Rash    Occlusive tape and bandaids   Wound Dressing Adhesive Rash    Immunization History  Administered Date(s) Administered   DTP 06/01/2001   Fluad Quad(high Dose 65+) 09/16/2019   Fluzone Influenza virus vaccine,trivalent (IIV3), split virus 10/24/2022   INFLUENZA, HIGH DOSE SEASONAL PF 10/22/2014, 09/07/2015, 09/17/2016, 10/22/2017   Influenza Split 10/16/2010, 10/08/2012, 10/24/2012, 10/20/2013   Influenza-Unspecified 10/03/2018,  11/02/2022   PFIZER Comirnaty(Gray Top)Covid-19 Tri-Sucrose Vaccine 11/21/2021   PFIZER(Purple Top)SARS-COV-2 Vaccination 02/13/2019, 03/10/2019, 10/05/2019, 04/19/2020   PNEUMOCOCCAL CONJUGATE-20 06/07/2022  Pfizer Covid-19 Vaccine Bivalent Booster 74yrs & up 09/29/2020   Pfizer(Comirnaty)Fall Seasonal Vaccine 12 years and older 04/18/2022, 10/24/2022   Pneumococcal Conjugate-13 10/22/2014   Pneumococcal Polysaccharide-23 02/10/2015   Respiratory Syncytial Virus Vaccine,Recomb Aduvanted(Arexvy) 11/21/2021   Tdap 09/18/2011, 05/17/2012, 06/07/2022   Zoster Recombinant(Shingrix) 08/18/2018   Zoster, Live 08/04/2010, 11/10/2018    Family History  Problem Relation Age of Onset   Hypertension Mother    Bone cancer Mother    Stroke Father 41   COPD Maternal Uncle      Current Outpatient Medications:    AFRIN 12 HOUR 0.05 % nasal spray, Place 1 spray into both nostrils 2 (two) times daily as needed for congestion., Disp: , Rfl:    Ascorbic Acid  (VITAMIN C ) 1000 MG tablet, Take 1,000 mg by mouth daily., Disp: , Rfl:    carvedilol (COREG) 6.25 MG tablet, Take 6.25 mg by mouth 2 (two) times daily with a meal., Disp: , Rfl:    celecoxib  (CELEBREX ) 200 MG capsule, Take 200-400 mg by mouth daily., Disp: , Rfl:    chlorhexidine  (HIBICLENS ) 4 % external liquid, Apply 1 Application topically daily as needed (boils)., Disp: , Rfl:    Cholecalciferol  (VITAMIN D3) 50 MCG (2000 UT) capsule, Take 4,000 Units by mouth daily., Disp: , Rfl:    ciprofloxacin (CIPRO) 500 MG tablet, Take 1 tablet (500 mg total) by mouth 2 (two) times daily., Disp: 20 tablet, Rfl: 0   COMBIVENT  RESPIMAT 20-100 MCG/ACT AERS respimat, INHALE 1 INHALATION BY MOUTH  INTO THE LUNGS EVERY 6 HOURS AS  NEEDED FOR WHEEZING OR SHORTNESS OF BREATH, Disp: 12 g, Rfl: 3   diphenhydrAMINE  (BENADRYL  ALLERGY ) 25 MG tablet, Take 25 mg by mouth at bedtime., Disp: , Rfl:    fluticasone  (FLONASE ) 50 MCG/ACT nasal spray, Place 1 spray into both  nostrils 2 (two) times daily., Disp: , Rfl:    Fluticasone -Umeclidin-Vilant (TRELEGY ELLIPTA ) 200-62.5-25 MCG/ACT AEPB, Inhale 1 puff into the lungs daily., Disp: , Rfl:    furosemide  (LASIX ) 20 MG tablet, Take 1 tablet (20 mg total) by mouth 2 (two) times daily for 7 days, THEN 1 tablet (20 mg total) daily for 21 days. (Patient taking differently: Take 20 mg by mouth once daily. ), Disp: 90 tablet, Rfl: 0   gabapentin  (NEURONTIN ) 300 MG capsule, Take 1 capsule (300 mg total) by mouth 4 (four) times daily., Disp: 360 capsule, Rfl: 0   Guaifenesin  (MUCINEX  MAXIMUM STRENGTH) 1200 MG TB12, Take 1,200 mg by mouth 2 (two) times daily., Disp: , Rfl:    JARDIANCE  10 MG TABS tablet, Take 10 mg by mouth daily., Disp: , Rfl:    Lutein-Zeaxanthin 25-5 MG CAPS, Take 1 capsule by mouth daily., Disp: , Rfl:    magnesium  hydroxide (MILK OF MAGNESIA) 400 MG/5ML suspension, Take 15 mLs by mouth at bedtime., Disp: , Rfl:    montelukast  (SINGULAIR ) 10 MG tablet, TAKE 1 TABLET BY MOUTH AT  BEDTIME, Disp: 90 tablet, Rfl: 3   Multiple Vitamin (MULTIVITAMIN WITH MINERALS) TABS tablet, Take 1 tablet by mouth daily., Disp: , Rfl:    omeprazole  (PRILOSEC) 40 MG capsule, Take 1 capsule (40 mg total) by mouth daily before breakfast., Disp: 90 capsule, Rfl: 3   Oxycodone  HCl 10 MG TABS, Take 10 mg by mouth every 4 (four) hours., Disp: , Rfl:    Propylene Glycol, PF, (SYSTANE COMPLETE PF) 0.6 % SOLN, Place 2 drops into both eyes daily as needed (dry eyes)., Disp: , Rfl:    rosuvastatin  (  CRESTOR ) 20 MG tablet, Take 1 tablet (20 mg total) by mouth at bedtime., Disp: 90 tablet, Rfl: 0   Simethicone  125 MG CAPS, Take 250 mg by mouth daily as needed (for gas)., Disp: , Rfl:    spironolactone  (ALDACTONE ) 25 MG tablet, Take 12.5 mg by mouth daily., Disp: , Rfl:    Tamsulosin  HCl (FLOMAX ) 0.4 MG CAPS, Take 0.4 mg by mouth 2 (two) times daily., Disp: , Rfl:    telmisartan  (MICARDIS ) 80 MG tablet, Take 0.5 tablets (40 mg total) by mouth 2  (two) times daily., Disp: 90 tablet, Rfl: 0   timolol  (TIMOPTIC ) 0.5 % ophthalmic solution, Place 1 drop into both eyes at bedtime., Disp: , Rfl:    aspirin  EC 81 MG tablet, Take 1 tablet (81 mg total) by mouth daily. Swallow whole. (Patient not taking: Reported on 11/19/2023), Disp: , Rfl:    doxycycline  (VIBRAMYCIN ) 100 MG capsule, Take 1 capsule (100 mg total) by mouth 2 (two) times daily. (Patient not taking: Reported on 11/19/2023), Disp: 20 capsule, Rfl: 0   finasteride  (PROSCAR ) 5 MG tablet, Take 5 mg by mouth daily. (Patient not taking: Reported on 11/19/2023), Disp: , Rfl:    magic mouthwash w/lidocaine  SOLN, Take 15 mLs by mouth 4 (four) times daily as needed for mouth pain. contains equal amounts of Maalox Extra Strength, nystatin, diphenhydramine  and lidocaine . (Patient not taking: Reported on 11/19/2023), Disp: 1000 mL, Rfl: 0   metoprolol  succinate (TOPROL -XL) 100 MG 24 hr tablet, Take 1 tablet (100 mg total) by mouth at bedtime. Take with or immediately following a meal. (Patient not taking: Reported on 11/19/2023), Disp: 90 tablet, Rfl: 0      Objective:   Vitals:   11/19/23 1355  BP: (!) 143/78  Pulse: 77  Temp: 97.9 F (36.6 C)  TempSrc: Oral  SpO2: 95%  Weight: 220 lb 3.2 oz (99.9 kg)  Height: 5' 6 (1.676 m)    Estimated body mass index is 35.54 kg/m as calculated from the following:   Height as of this encounter: 5' 6 (1.676 m).   Weight as of this encounter: 220 lb 3.2 oz (99.9 kg).  @WEIGHTCHANGE @  [employer]   11/19/23 1355  Weight: 220 lb 3.2 oz (99.9 kg)     Physical Exam   General: No distress. Looks swell O2 at rest: no Cane present: non Sitting in wheel chair: o Frail: no Obese: YES Neuro: Alert and Oriented x 3. GCS 15. Speech normal Psych: Pleasant Resp:  Barrel Chest - no.  Wheeze - no, Crackles - no, No overt respiratory distress CVS: Normal heart sounds. Murmurs - no Ext: Stigmata of Connective Tissue Disease - no HEENT: Normal  upper airway. PEERL +. No post nasal drip        Assessment/     Assessment & Plan Recurrent pneumonia  Ground glass opacity present on imaging of lung  History of gastroesophageal reflux (GERD)  Recurrent pneumonia CT scan showed patchy opacities, worse than August but improved from May and June. Differential includes acid reflux. Autoimmune causes considered but not confirmed. - Ensure no mold or mildew in the house. - Rechecked autoimmune markers today. - Advised wearing a mask around grass and dust. - Advised eliminating down products from the house.  Gastroesophageal reflux disease (GERD) GERD controlled with omeprazole  and diet. Possible nocturnal acid reflux affecting lungs. - Continue omeprazole . - Advised on dietary modifications.  PLAN Patient Instructions     ICD-10-CM   1. Recurrent pneumonia  J18.9  2. Ground glass opacity present on imaging of lung  R91.8     3. History of gastroesophageal reflux (GERD)  Z87.19      CT chest Nov 2025 is tad worse than Aug 2025 but overall better than May 2025, June 2025 Clinically  you are doing well Exact reason for pneumonia and infiltrates not known but possibilities include acid reflux, aspiration, any mold exposure or nonspecific infection or BOOP  Plan  - Check blood work for ANA, sedimentation rate, hypersensitive pneumonitis panel and QuantiFERON gold -Continue acid reflux precautions that includes taking her medication and sleeping with the head end of the bed elevated - Always  mask when mowing the grass -Do mold inspection in the house - Evaluate your house for any down soofa, comforter blanket or jacket  - Get rid of everything that you have that is down related  - Avoid respiratory illness sick exposure and control your risk for respiratory infection  - be uptodate with all respiratory vaccines  - avoid sick contacts especially in areas of indoor clusters (churches, weddings, funerals, family  gatherings, birthdays, planes, malls, indoor areas especially)   - mask in these areas   - discourage sick people from coming in close contact with you   Folllwou;  - Clinical follow-up with nurse practitioner in 3 months and at which time we can decide follow-up CT scan timing.      FOLLOWUP    Return for  - Clinical follow-up with nurse practitioner in 3 months; decide follow-up CT scan timing.SABRA    SIGNATURE    Dr. Dorethia Cave, M.D., F.C.C.P,  Pulmonary and Critical Care Medicine Staff Physician, [facility] Director - Interstitial Lung Disease  Program  Pulmonary Fibrosis [facility] at [facility] [place], [place], [address]  Pager: [phone], If no answer or between  15:00h - 7:00h: call [phone] Telephone: [phone]  2:24 PM 11/19/2023

## 2023-11-19 NOTE — Patient Instructions (Addendum)
 ICD-10-CM   1. Recurrent pneumonia  J18.9     2. Ground glass opacity present on imaging of lung  R91.8     3. History of gastroesophageal reflux (GERD)  Z87.19      CT chest Nov 2025 is tad worse than Aug 2025 but overall better than May 2025, June 2025 Clinically  you are doing well Exact reason for pneumonia and infiltrates not known but possibilities include acid reflux, aspiration, any mold exposure or nonspecific infection or BOOP  Plan  - Check blood work for ANA, sedimentation rate, hypersensitive pneumonitis panel and QuantiFERON gold -Continue acid reflux precautions that includes taking her medication and sleeping with the head end of the bed elevated - Always  mask when mowing the grass -Do mold inspection in the house - Evaluate your house for any down soofa, comforter blanket or jacket  - Get rid of everything that you have that is down related  - Avoid respiratory illness sick exposure and control your risk for respiratory infection  - be uptodate with all respiratory vaccines  - avoid sick contacts especially in areas of indoor clusters (churches, weddings, funerals, family gatherings, birthdays, planes, malls, indoor areas especially)   - mask in these areas   - discourage sick people from coming in close contact with you   Folllwou;  - Clinical follow-up with nurse practitioner in 3 months and at which time we can decide follow-up CT scan timing.

## 2023-11-20 NOTE — Telephone Encounter (Signed)
 MR, please advise on refill. Medication is not mentioned in last OV.

## 2023-11-21 ENCOUNTER — Ambulatory Visit: Payer: Self-pay | Admitting: Internal Medicine

## 2023-11-21 NOTE — Progress Notes (Signed)
 normal

## 2023-11-25 ENCOUNTER — Ambulatory Visit: Attending: Cardiovascular Disease | Admitting: Cardiovascular Disease

## 2023-11-25 ENCOUNTER — Encounter: Payer: Self-pay | Admitting: Cardiovascular Disease

## 2023-11-25 VITALS — BP 120/62 | HR 75 | Ht 66.0 in | Wt 220.0 lb

## 2023-11-25 DIAGNOSIS — Z01810 Encounter for preprocedural cardiovascular examination: Secondary | ICD-10-CM | POA: Diagnosis present

## 2023-11-25 DIAGNOSIS — J42 Unspecified chronic bronchitis: Secondary | ICD-10-CM | POA: Diagnosis present

## 2023-11-25 DIAGNOSIS — I10 Essential (primary) hypertension: Secondary | ICD-10-CM | POA: Insufficient documentation

## 2023-11-25 DIAGNOSIS — I5033 Acute on chronic diastolic (congestive) heart failure: Secondary | ICD-10-CM | POA: Diagnosis present

## 2023-11-25 NOTE — Patient Instructions (Signed)
 Medication Instructions:  Your physician recommends that you continue on your current medications as directed. Please refer to the Current Medication list given to you today.  *If you need a refill on your cardiac medications before your next appointment, please call your pharmacy*  Lab Work: None  If you have labs (blood work) drawn today and your tests are completely normal, you will receive your results only by: MyChart Message (if you have MyChart) OR A paper copy in the mail If you have any lab test that is abnormal or we need to change your treatment, we will call you to review the results.  Testing/Procedures: None   Follow-Up: At [facility] you and your health needs are our priority.  As part of our continuing mission to provide you with exceptional heart care, our providers are all part of one team.  This team includes your primary Cardiologist (physician) and Advanced Practice Providers or APPs (Physician Assistants and Nurse Practitioners) who all work together to provide you with the care you need, when you need it.  Your next appointment:   1 year(s)  Provider:   Maude Emmer, MD    We recommend signing up for the patient portal called MyChart.  Sign up information is provided on this After Visit Summary.  MyChart is used to connect with patients for Virtual Visits (Telemedicine).  Patients are able to view lab/test results, encounter notes, upcoming appointments, etc.  Non-urgent messages can be sent to your provider as well.   To learn more about what you can do with MyChart, go to [email or web address].   Other Instructions None

## 2023-11-25 NOTE — Progress Notes (Signed)
 Cardiology Office Note:   Date:  11/25/2023  ID:  Cody Tate, DOB [birth year], MRN [id number] PCP: Charlott Dorn LABOR, MD  [facility] Providers Cardiologist:  Maude Emmer, MD    History of Present Illness:    Cody Tate is a 73 year old male with hypertension and COPD who presents with worsening shortness of breath and edema. He is accompanied by his wife.   In October 2024, he reported weight gain and worsening edema, suggesting fluid retention. An echocardiogram at that time revealed normal ejection fraction and grade one diastolic dysfunction. Because his BNP was minimally elevated, diuretic increased. Following this adjustment, he noted improvement in fluid levels, weight reduction, and lessened severity of edema. At subsequent follow up, we added Jardiance  as well as Spironolactone  but ultimately had to discontinue Spironolactone  due to hyperkalemia and AKI. Patient was given a diuretic break and put back on lower 20mg  dose of Lasix . We also decreases his Telmisartan  to 40mg  once daily but per spouse, patient has gone back to twice daily.   Today he continues with chronic lower extremity edema, left greater than right. He uses compression socks for edema management. He uses two pillows at night to aid breathing, up from one previously.  He continues to experience shortness of breath, particularly in the mornings, which improves by the afternoon. Physical activity exacerbates the dyspnea. He uses a walker due to leg weakness and walks slowly. These symptoms have been present for some time, with no significant changes recently. Since our last visit, has seen pulmonology with significant workup. Extensive testing, including an HRCT scan and PET scan, was performed. The PET scan showed residual post-infectious versus inflammatory scarring in the right upper lobe of the lung, attributed to a recent respiratory infection. The HRCT scan showed emphysema and consolidation in the  right upper lobe with improvement, as well as coronary artery calcification and irregular liver margin suggesting possible cirrhosis. An overnight pulse oximetry study indicated nocturnal oxygen requirement, and a sleep study was arranged due to nocturnal desaturation. Patient also had inflammatory labs checked with follow up scheduled next month with pulmonology. Regarding pulmonary medications, he previously trialed Breztri  but returned to Trelegy due to preference and ease of use. Current inhaler regimen includes Trelegy, which he prefers over Breztri .  Has some postural symptoms in past.       Studies Reviewed:    11/07/22 TTE IMPRESSIONS     1. Left ventricular ejection fraction, by estimation, is 70 to 75%. The  left ventricle has hyperdynamic function. The left ventricle has no  regional wall motion abnormalities. There is mild left ventricular  hypertrophy. Left ventricular diastolic  parameters are consistent with Grade I diastolic dysfunction (impaired  relaxation). The average left ventricular global longitudinal strain is  -17.8 %. The global longitudinal strain is normal.   2. Right ventricular systolic function is normal. The right ventricular  size is normal.   3. The mitral valve is normal in structure. No evidence of mitral valve  regurgitation. No evidence of mitral stenosis.   4. The aortic valve is tricuspid. There is moderate calcification of the  aortic valve. There is mild thickening of the aortic valve. Aortic valve  regurgitation is not visualized. Aortic valve sclerosis is present, with  no evidence of aortic valve  stenosis. Aortic valve mean gradient measures 7.0 mmHg. Aortic valve Vmax  measures 1.87 m/s.   5. The inferior vena cava is normal in size with greater than 50%  respiratory  variability, suggesting right atrial pressure of 3 mmHg.   FINDINGS   Left Ventricle: Left ventricular ejection fraction, by estimation, is 70  to 75%. The left ventricle  has hyperdynamic function. The left ventricle  has no regional wall motion abnormalities. The average left ventricular  global longitudinal strain is -17.8   %. The global longitudinal strain is normal. The left ventricular  internal cavity size was normal in size. There is mild left ventricular  hypertrophy. Left ventricular diastolic parameters are consistent with  Grade I diastolic dysfunction (impaired  relaxation).   Right Ventricle: The right ventricular size is normal. No increase in  right ventricular wall thickness. Right ventricular systolic function is  normal.   Left Atrium: Left atrial size was normal in size.   Right Atrium: Right atrial size was normal in size.   Pericardium: There is no evidence of pericardial effusion.   Mitral Valve: The mitral valve is normal in structure. No evidence of  mitral valve regurgitation. No evidence of mitral valve stenosis.   Tricuspid Valve: The tricuspid valve is normal in structure. Tricuspid  valve regurgitation is not demonstrated. No evidence of tricuspid  stenosis.   Aortic Valve: The aortic valve is tricuspid. There is moderate  calcification of the aortic valve. There is mild thickening of the aortic  valve. Aortic valve regurgitation is not visualized. Aortic valve  sclerosis is present, with no evidence of aortic  valve stenosis. Aortic valve mean gradient measures 7.0 mmHg. Aortic valve  peak gradient measures 14.0 mmHg. Aortic valve area, by VTI measures 2.99  cm.   Pulmonic Valve: The pulmonic valve was normal in structure. Pulmonic valve  regurgitation is not visualized. No evidence of pulmonic stenosis.   Aorta: The aortic root is normal in size and structure.   Venous: The inferior vena cava is normal in size with greater than 50%  respiratory variability, suggesting right atrial pressure of 3 mmHg.   IAS/Shunts: No atrial level shunt detected by color flow Doppler.    Risk Assessment/Calculations:               Physical Exam:   VS:  BP 120/62   Pulse 75   Ht 5' 6 (1.676 m)   Wt 220 lb (99.8 kg)   SpO2 97%   BMI 35.51 kg/m    Wt Readings from Last 3 Encounters:  11/25/23 220 lb (99.8 kg)  11/19/23 220 lb 3.2 oz (99.9 kg)  09/18/23 222 lb (100.7 kg)     BP 120/62   Pulse 75   Ht 5' 6 (1.676 m)   Wt 220 lb (99.8 kg)   SpO2 97%   BMI 35.51 kg/m   Affect appropriate Healthy:  appears stated age HEENT: normal Neck supple with no adenopathy JVP normal no bruits no thyromegaly Lungs clear with no wheezing and good diaphragmatic motion Heart:  S1/S2 no murmur, no rub, gallop or click PMI normal Abdomen: benighn, BS positve, no tenderness, no AAA no bruit.  No HSM or HJR Distal pulses intact with no bruits Plus 2  edema Neuro non-focal Skin warm and dry No muscular weakness  ASSESSMENT AND PLAN:     Assessment and Plan    Shortness of breath Pulmonology evaluation revealed residual post-infectious versus inflammatory scarring in the right upper lobe and emphysema. HRCT scan showed improvement in consolidation. Nocturnal oxygen study indicated borderline need for oxygen, initially declined but now interested. Discussed that untreated sleep apnea could exacerbate cardiac issues. Oxygen therapy at night  is recommended to alleviate symptoms. CPAP may be necessary if sleep apnea is confirmed. 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 Current inhaler regimen includes Trelegy. Breztri  trialed but not preferred due to dosing frequency and lack of perceived benefit. Trelegy is preferred by patient due to ease of use and effectiveness. - Continue Trelegy inhaler - Monitor for changes in respiratory status and adjust treatment as needed - H2 blocker to minimize reflux -  hypersensitivity panel ESR/ANA negative  Chronic lower extremity edema Chronic lower extremity edema, left greater than right, with fluid retention. Recent chocardiogram showed normal ejection fraction and grade one diastolic dysfunction. BNP marginally elevated at 119. Imaging suggests possible liver cirrhosis contributing to edema. Obesity is also a contributing factor. Less likely directly attributable to heart failure, suspect a degree of venous insufficiency - Continue Lasix  20 mg daily - Monitor weight and fluid levels - Consider liver cirrhosis as a contributing factor - BNP still essentially normal at 110 06/28/23   Diastolic dysfunction Grade one diastolic dysfunction noted on echocardiogram. Heart function otherwise normal with hyperdynamic LV. Discussed that jardiance  is beneficial for managing diastolic dysfunction. - Continue Jardiance  - continue Lasix  20mg  - Will avoid MRA given AKI and hyperkalemia with previous trial - Check renal function and BNP  Hypertension Blood pressure elevated at 154/72 and 152/74, likely secondary to a degree of dietary indiscretion. Currently on telmisartan  40 mg twice daily and metoprolol  succinate 100mg  once daily. Previous issues with spironolactone  due to hyperkalemia. Amlodipine not preferred due to potential for worsening peripheral edema. HR borderline for increasing beta blocker.  - Monitor blood pressure at home -F/U primary   Coronary artery calcification Coronary artery calcification noted on high res lung CT scan. No angina reported. Importance of managing cholesterol emphasized.  -Aspirin  81 mg daily -LDL 71 , continue simvastatin  20mg  for now  Liver cirrhosis Recent imaging suggests possible cirrhosis with irregular liver margin. Potential contributor to lower extremity edema. Labs overall normal, but monitoring required. - Continue monitoring liver function with Dr. Charlott  Preoperative:   Ok to have colonoscopy in am with  [facility]    F/U in a  year    Signed, Maude Emmer, MD

## 2023-11-26 DIAGNOSIS — K640 First degree hemorrhoids: Secondary | ICD-10-CM | POA: Diagnosis not present

## 2023-11-26 DIAGNOSIS — Z860101 Personal history of adenomatous and serrated colon polyps: Secondary | ICD-10-CM | POA: Diagnosis not present

## 2023-11-26 DIAGNOSIS — K5730 Diverticulosis of large intestine without perforation or abscess without bleeding: Secondary | ICD-10-CM | POA: Diagnosis not present

## 2023-11-26 DIAGNOSIS — Z09 Encounter for follow-up examination after completed treatment for conditions other than malignant neoplasm: Secondary | ICD-10-CM | POA: Diagnosis not present

## 2023-11-26 LAB — QUANTIFERON-TB GOLD PLUS
Mitogen-NIL: 0.62 [IU]/mL
NIL: 0.01 [IU]/mL
QuantiFERON-TB Gold Plus: NEGATIVE
TB1-NIL: 0 [IU]/mL
TB2-NIL: 0 [IU]/mL

## 2023-11-26 LAB — HYPERSENSITIVITY PNUEMONITIS PROFILE
ASPERGILLUS FUMIGATUS: NEGATIVE
Faenia retivirgula: NEGATIVE
Pigeon Serum: NEGATIVE
S. VIRIDIS: NEGATIVE
T. CANDIDUS: NEGATIVE
T. VULGARIS: NEGATIVE

## 2023-11-26 LAB — ANA: Anti Nuclear Antibody (ANA): NEGATIVE

## 2023-12-01 DIAGNOSIS — J449 Chronic obstructive pulmonary disease, unspecified: Secondary | ICD-10-CM | POA: Diagnosis not present

## 2023-12-01 DIAGNOSIS — I10 Essential (primary) hypertension: Secondary | ICD-10-CM | POA: Diagnosis not present

## 2023-12-01 DIAGNOSIS — E7800 Pure hypercholesterolemia, unspecified: Secondary | ICD-10-CM | POA: Diagnosis not present

## 2023-12-01 DIAGNOSIS — I700 Atherosclerosis of aorta: Secondary | ICD-10-CM | POA: Diagnosis not present

## 2023-12-03 DIAGNOSIS — H353123 Nonexudative age-related macular degeneration, left eye, advanced atrophic without subfoveal involvement: Secondary | ICD-10-CM | POA: Diagnosis not present

## 2023-12-03 DIAGNOSIS — H353114 Nonexudative age-related macular degeneration, right eye, advanced atrophic with subfoveal involvement: Secondary | ICD-10-CM | POA: Diagnosis not present

## 2023-12-12 DIAGNOSIS — J441 Chronic obstructive pulmonary disease with (acute) exacerbation: Secondary | ICD-10-CM | POA: Diagnosis not present

## 2023-12-12 DIAGNOSIS — J449 Chronic obstructive pulmonary disease, unspecified: Secondary | ICD-10-CM | POA: Diagnosis not present

## 2023-12-12 DIAGNOSIS — I5189 Other ill-defined heart diseases: Secondary | ICD-10-CM | POA: Diagnosis not present

## 2023-12-12 DIAGNOSIS — G894 Chronic pain syndrome: Secondary | ICD-10-CM | POA: Diagnosis not present

## 2023-12-12 DIAGNOSIS — I10 Essential (primary) hypertension: Secondary | ICD-10-CM | POA: Diagnosis not present

## 2023-12-12 DIAGNOSIS — K7460 Unspecified cirrhosis of liver: Secondary | ICD-10-CM | POA: Diagnosis not present

## 2024-01-09 ENCOUNTER — Ambulatory Visit: Admitting: Cardiovascular Disease

## 2024-01-15 ENCOUNTER — Encounter: Payer: Self-pay | Admitting: Primary Care

## 2024-01-15 ENCOUNTER — Ambulatory Visit: Admitting: Primary Care

## 2024-01-15 ENCOUNTER — Ambulatory Visit

## 2024-01-15 ENCOUNTER — Ambulatory Visit: Admitting: *Deleted

## 2024-01-15 VITALS — BP 122/72 | HR 62 | Temp 97.8°F | Ht 65.0 in | Wt 223.0 lb

## 2024-01-15 DIAGNOSIS — Z87891 Personal history of nicotine dependence: Secondary | ICD-10-CM

## 2024-01-15 DIAGNOSIS — J189 Pneumonia, unspecified organism: Secondary | ICD-10-CM

## 2024-01-15 DIAGNOSIS — J449 Chronic obstructive pulmonary disease, unspecified: Secondary | ICD-10-CM

## 2024-01-15 DIAGNOSIS — R918 Other nonspecific abnormal finding of lung field: Secondary | ICD-10-CM

## 2024-01-15 LAB — PULMONARY FUNCTION TEST: DL/VA % pred: 94 %

## 2024-01-15 NOTE — Progress Notes (Signed)
 Full PFT performed today.

## 2024-01-15 NOTE — Progress Notes (Addendum)
 "  @Patient  ID: Cody Tate, male    DOB: [birth year], 74 y.o.   MRN: [id number]  No chief complaint on file.   Referring provider: Hope Almarie ORN, NP  HPI:   Previous [facility] pulmonary encounter: 11/19/2023 -   Chief Complaint  Patient presents with   COPD    Pt states since LOV breathing has been about the same SOB w/ exertion     HPI Cody Tate 74 y.o. -Cody Tate is a 74 year old male who presents for follow-up of his pulmonary condition.  He was hospitalized in the spring and again in June for pneumonia. Since June, he has not had any further hospitalizations. He has had two follow-up visits in the clinic. A CT scan was performed on November 06, 2023, and compared to previous scans from earlier in the year and August. He has not undergone a lung biopsy or bronchoscopy.  In August, he visited the emergency room due to a forehead bruise that caused swelling and a black eye. There have been no surgeries reported.  He experienced vomiting once on a Sunday evening, which he suspects might have been related to taking medication on an empty stomach. No nausea, diarrhea, fever, or chills. He has acid reflux, which is controlled with omeprazole  as long as he eats early.  He has an allergy  to grass and mows the lawn, which may require wearing a mask. He denies having any birds or feather items in the house.    CT Chest data from date: Nov 2025  - personally visualized and independently interpreted : yes - my findings are: as below but   IMPRESSION: 1. Worsening multifocal pneumonia with increasing patchy ground-glass infiltrates in the left greater than right upper lobes and in the left lower lobe and lingula, with underlying nodular airspace disease in the left lower lobe and lingula. 2. Increased bronchial wall thickening in the lower lobes and right middle lobe. 3. Unchanged tree-in-bud opacities in the right middle lobe base, consistent with chronic small  airways disease. 4. Follow-up study recommended in 2 to 3 months to ensure clearing. 5. Aortic and coronary artery atherosclerosis.     01/15/2024- Interim hx    - Patient of Dr. Geronimo, last seen on 11/19/23 for recurrent pneumonia treated with Cipro   - CT chest Nov 2025 is tad worse than Aug 2025 but overall better than May 2025, June 2025 Clinically stable. Exact reason for pneumonia and infiltrates not known but possibilities include acid reflux, aspiration, any mold exposure or nonspecific infection or BOOP - Labs were normal- ANA, hypersensitively panel and Quantiferon GOLD negative    Discussed the use of AI scribe software for clinical note transcription with the patient, who gave verbal consent to proceed.  History of Present Illness Cody Tate is a 74 year old male with recurrent pneumonia and COPD who presents for follow-up of his lung condition.  He has a history of recurrent pneumonia, with episodes occurring in the spring and fall of the previous year. A CT scan in November showed slight worsening compared to August, but overall improvement from the spring. The exact cause of the pneumonia and lung infiltrates remains unclear, with possibilities including reflux, aspiration, mold exposure, nonspecific infection, or bronchiolitis obliterans organizing pneumonia (BOOP). He underwent a series of diagnostic tests, including lab work, which returned normal results. His ANA, hypersensitivity pneumonitis panel, and TB test were negative. He has not conducted a mold inspection of his home but has switched  to an alternative down comforter to avoid potential allergens. He was treated with Cipro  in November, which improved his symptoms. No current symptoms such as cough with colored mucus, shortness of breath, or fevers. He mentions a previous illness before Thanksgiving, which lasted a couple of weeks, but he has since recovered.  He has a history of COPD and recently underwent a  breathing test. His lung function was 75% on a recent test and was 76% in 2015. He uses Trelegy inhaler regularly. He has a history of smoking. His diffusion capacity is normal at 91%.  He experiences occasional heartburn and is on Prilosec (omeprazole ) for reflux management. He sleeps with his head elevated to mitigate reflux symptoms.  He received the Prevnar 20 pneumonia vaccine in December 2024.  He mentions a scheduled liver ultrasound due to concerns about possible cirrhosis, although he has not consumed alcohol  in years.   Pulmonary function testing: 01/15/24>> FVC 2.89 (83%), FEV1 1.88 (75%), ratio 65, DLCOunc19.79 (91%)  Allergies[1]  Immunization History  Administered Date(s) Administered   DTP 06/01/2001   Fluad Quad(high Dose 65+) 09/16/2019   Fluzone Influenza virus vaccine,trivalent (IIV3), split virus 10/24/2022   INFLUENZA, HIGH DOSE SEASONAL PF 10/22/2014, 09/07/2015, 09/17/2016, 10/22/2017   Influenza Split 10/16/2010, 10/08/2012, 10/24/2012, 10/20/2013   Influenza-Unspecified 10/03/2018, 11/02/2022   PFIZER Comirnaty(Gray Top)Covid-19 Tri-Sucrose Vaccine 11/21/2021   PFIZER(Purple Top)SARS-COV-2 Vaccination 02/13/2019, 03/10/2019, 10/05/2019, 04/19/2020   PNEUMOCOCCAL CONJUGATE-20 06/07/2022   Pfizer Covid-19 Vaccine Bivalent Booster 52yrs & up 09/29/2020   Pfizer(Comirnaty)Fall Seasonal Vaccine 12 years and older 04/18/2022, 10/24/2022   Pneumococcal Conjugate-13 10/22/2014   Pneumococcal Polysaccharide-23 02/10/2015   Respiratory Syncytial Virus Vaccine,Recomb Aduvanted(Arexvy) 11/21/2021   Tdap 09/18/2011, 05/17/2012, 06/07/2022   Zoster Recombinant(Shingrix) 08/18/2018   Zoster, Live 08/04/2010, 11/10/2018    Past Medical History:  Diagnosis Date   Acute heart failure with preserved ejection fraction (HCC) 11/12/2022   Arthritis    Back pain    Radiates down both legs   BPH (benign prostatic hyperplasia)    Cancer (HCC) 2022   prostate cancer   COPD  (chronic obstructive pulmonary disease) (HCC)    Dyspnea    Enlarged prostate    GERD (gastroesophageal reflux disease)    Glaucoma    H/O hiatal hernia    H/O wheezing    occ inhaler usage   Heart murmur    Hypertension    Lumbar foraminal stenosis    Macular degeneration    Meningioma (HCC) 2014   Treated with radiation   Neuromuscular disorder (HCC)    Neuropathy bilateral legs   PONV (postoperative nausea and vomiting)    Pulmonary nodules    resolved on 2019 follow up CT   S/P insertion of spinal cord stimulator     Tobacco History: Tobacco Use History[2] Counseling given: Not Answered Tobacco comments: quit alcohol  73   Outpatient Medications Prior to Visit  Medication Sig Dispense Refill   AFRIN 12 HOUR 0.05 % nasal spray Place 1 spray into both nostrils 2 (two) times daily as needed for congestion.     Ascorbic Acid  (VITAMIN C ) 1000 MG tablet Take 1,000 mg by mouth daily.     aspirin  EC 81 MG tablet Take 1 tablet (81 mg total) by mouth daily. Swallow whole. (Patient not taking: Reported on 11/25/2023)     carvedilol (COREG) 6.25 MG tablet Take 6.25 mg by mouth 2 (two) times daily with a meal.     celecoxib  (CELEBREX ) 200 MG capsule Take 200-400 mg by mouth  daily.     chlorhexidine  (HIBICLENS ) 4 % external liquid Apply 1 Application topically daily as needed (boils).     Cholecalciferol  (VITAMIN D3) 50 MCG (2000 UT) capsule Take 4,000 Units by mouth daily.     ciprofloxacin  (CIPRO ) 500 MG tablet Take 1 tablet (500 mg total) by mouth 2 (two) times daily. 20 tablet 0   COMBIVENT  RESPIMAT 20-100 MCG/ACT AERS respimat INHALE 1 INHALATION BY MOUTH  INTO THE LUNGS EVERY 6 HOURS AS  NEEDED FOR WHEEZING OR SHORTNESS OF BREATH 12 g 3   diphenhydrAMINE  (BENADRYL  ALLERGY ) 25 MG tablet Take 25 mg by mouth at bedtime.     doxycycline  (VIBRAMYCIN ) 100 MG capsule Take 1 capsule (100 mg total) by mouth 2 (two) times daily. (Patient not taking: Reported on 11/25/2023) 20 capsule 0    finasteride  (PROSCAR ) 5 MG tablet Take 5 mg by mouth daily. (Patient not taking: Reported on 11/25/2023)     fluticasone  (FLONASE ) 50 MCG/ACT nasal spray Place 1 spray into both nostrils 2 (two) times daily.     Fluticasone -Umeclidin-Vilant (TRELEGY ELLIPTA ) 200-62.5-25 MCG/ACT AEPB Inhale 1 puff into the lungs daily.     furosemide  (LASIX ) 20 MG tablet Take 1 tablet (20 mg total) by mouth 2 (two) times daily for 7 days, THEN 1 tablet (20 mg total) daily for 21 days. (Patient taking differently: Take 20 mg by mouth once daily. ) 90 tablet 0   gabapentin  (NEURONTIN ) 300 MG capsule Take 1 capsule (300 mg total) by mouth 4 (four) times daily. 360 capsule 0   Guaifenesin  (MUCINEX  MAXIMUM STRENGTH) 1200 MG TB12 Take 1,200 mg by mouth 2 (two) times daily.     JARDIANCE  10 MG TABS tablet Take 10 mg by mouth daily.     Lutein-Zeaxanthin 25-5 MG CAPS Take 1 capsule by mouth daily.     magic mouthwash w/lidocaine  SOLN Take 15 mLs by mouth 4 (four) times daily as needed for mouth pain. contains equal amounts of Maalox Extra Strength, nystatin, diphenhydramine  and lidocaine . (Patient not taking: Reported on 11/25/2023) 1000 mL 0   magnesium  hydroxide (MILK OF MAGNESIA) 400 MG/5ML suspension Take 15 mLs by mouth at bedtime.     metoprolol  succinate (TOPROL -XL) 100 MG 24 hr tablet Take 1 tablet (100 mg total) by mouth at bedtime. Take with or immediately following a meal. (Patient not taking: Reported on 11/25/2023) 90 tablet 0   montelukast  (SINGULAIR ) 10 MG tablet TAKE 1 TABLET BY MOUTH AT  BEDTIME 90 tablet 3   Multiple Vitamin (MULTIVITAMIN WITH MINERALS) TABS tablet Take 1 tablet by mouth daily.     omeprazole  (PRILOSEC) 40 MG capsule Take 1 capsule (40 mg total) by mouth daily before breakfast. 90 capsule 3   Oxycodone  HCl 10 MG TABS Take 10 mg by mouth every 4 (four) hours.     Propylene Glycol, PF, (SYSTANE COMPLETE PF) 0.6 % SOLN Place 2 drops into both eyes daily as needed (dry eyes).     rosuvastatin   (CRESTOR ) 20 MG tablet Take 1 tablet (20 mg total) by mouth at bedtime. 90 tablet 0   Simethicone  125 MG CAPS Take 250 mg by mouth daily as needed (for gas).     spironolactone  (ALDACTONE ) 25 MG tablet Take 12.5 mg by mouth daily.     Tamsulosin  HCl (FLOMAX ) 0.4 MG CAPS Take 0.4 mg by mouth 2 (two) times daily.     telmisartan  (MICARDIS ) 80 MG tablet Take 0.5 tablets (40 mg total) by mouth 2 (two) times daily. 90  tablet 0   timolol  (TIMOPTIC ) 0.5 % ophthalmic solution Place 1 drop into both eyes at bedtime.     No facility-administered medications prior to visit.   Review of Systems  Review of Systems  Respiratory: Negative.     Physical Exam  There were no vitals taken for this visit. Physical Exam Constitutional:      General: He is not in acute distress.    Appearance: Normal appearance. He is well-developed. He is not ill-appearing.  HENT:     Head: Normocephalic and atraumatic.     Mouth/Throat:     Mouth: Mucous membranes are moist.     Pharynx: Oropharynx is clear.  Cardiovascular:     Rate and Rhythm: Normal rate and regular rhythm.     Heart sounds: Murmur heard.  Pulmonary:     Effort: Pulmonary effort is normal. No respiratory distress.     Breath sounds: Normal breath sounds. No wheezing or rhonchi.     Comments: CTA Musculoskeletal:        General: Normal range of motion.     Cervical back: Normal range of motion and neck supple.  Skin:    General: Skin is warm and dry.     Findings: No erythema or rash.  Neurological:     General: No focal deficit present.     Mental Status: He is alert and oriented to person, place, and time. Mental status is at baseline.  Psychiatric:        Mood and Affect: Mood normal.        Behavior: Behavior normal.        Thought Content: Thought content normal.        Judgment: Judgment normal.     Lab Results:  CBC    Component Value Date/Time   WBC 9.2 06/29/2023 0616   RBC 4.60 06/29/2023 0616   HGB 13.3 06/29/2023  0616   HCT 41.2 06/29/2023 0616   PLT 256 06/29/2023 0616   MCV 89.6 06/29/2023 0616   MCH 28.9 06/29/2023 0616   MCHC 32.3 06/29/2023 0616   RDW 14.3 06/29/2023 0616   LYMPHSABS 0.7 06/29/2023 0616   MONOABS 1.7 (H) 06/29/2023 0616   EOSABS 0.2 06/29/2023 0616   BASOSABS 0.1 06/29/2023 0616    BMET    Component Value Date/Time   NA 133 (L) 06/29/2023 0616   NA 141 03/21/2023 1307   K 4.1 06/29/2023 0616   CL 94 (L) 06/29/2023 0616   CO2 29 06/29/2023 0616   GLUCOSE 103 (H) 06/29/2023 0616   BUN 18 06/29/2023 0616   BUN 18 03/21/2023 1307   BUN 22.8 08/05/2014 1203   CREATININE 0.73 06/29/2023 0616   CREATININE 1.0 08/05/2014 1203   CALCIUM  9.3 06/29/2023 0616   GFRNONAA >60 06/29/2023 0616   GFRAA 100 01/29/2020 1430    BNP    Component Value Date/Time   BNP 110.7 (H) 06/28/2023 0449    ProBNP    Component Value Date/Time   PROBNP 633.0 (H) 06/25/2023 1724   PROBNP 335 03/21/2023 1307   PROBNP 119.0 (H) 11/15/2022 1450    Imaging: No results found.   Assessment & Plan:    1. Chronic obstructive pulmonary disease, unspecified COPD type (HCC) (Primary)  2. Recurrent pneumonia - DG Chest 2 View; Future  3. Ground glass opacity present on imaging of lung - DG Chest 2 View; Future   Assessment and Plan Assessment & Plan Recurrent pneumonia Recurrent pneumonia with unclear etiology. Previous CT  in November 2025 showed worsening compared to August but improvement from spring. Negative ANA, hypersensitivity pneumonitis panel, and TB test. Possible causes include reflux, aspiration, mold exposure, nonspecific infection, or BOOP. No active symptoms currently. Previous treatment with Cipro  was effective. - Ordered chest x-ray today - If chest x-ray shows concerning findings, will order CT scan - Continue monitoring for recurrent pneumonia - Follow up in three months with Doctor Ramaswamy  Chronic obstructive pulmonary disease Moderate obstructive lung  disease based on recent spirometry. Lung function at 75%, stable compared to spirometry in 2015. Normal diffusion capacity at 91%. No current respiratory symptoms. Smoking history present. Currently on Triple therapy regimen with Trelegy inhaler. - Continue Trelegy inhaler 200mcg one puff daily  - Monitor clinical symptoms    Almarie LELON Ferrari, NP 01/15/2024     [1]  Allergies Allergen Reactions   Amlodipine Besylate Other (See Comments)    fatigue   Aspirin  Other (See Comments)     GI upset   Other Itching, Other (See Comments), Rash and Dermatitis    VICRYL Sutures   Tape Itching, Other (See Comments) and Rash    Occlusive tape and bandaids   Wound Dressing Adhesive Rash  [2]  Social History Tobacco Use  Smoking Status Former   Current packs/day: 0.00   Average packs/day: 1.5 packs/day for 35.0 years (52.5 ttl pk-yrs)   Types: Cigarettes   Start date: 05/03/1965   Quit date: 05/03/2000   Years since quitting: 23.7  Smokeless Tobacco Never  Tobacco Comments   quit alcohol  73   "

## 2024-01-15 NOTE — Patient Instructions (Addendum)
" °  VISIT SUMMARY: Cody Tate is a 74 year old male who came in for a follow-up on his lung condition, including recurrent pneumonia and COPD. He has a history of recurrent pneumonia with unclear causes and has undergone various tests, all of which returned normal results. He is currently symptom-free and uses a Trelegy inhaler for COPD management. He also experiences occasional heartburn and manages it with Prilosec. He received the Prevnar 20 pneumonia vaccine in December 2024 and has a scheduled liver ultrasound due to concerns about possible cirrhosis.  YOUR PLAN: -RECURRENT PNEUMONIA: Recurrent pneumonia means you have had multiple episodes of lung infection. The exact cause is still unclear, but possibilities include reflux, aspiration, mold exposure, nonspecific infection, or BOOP. We have ordered a chest x-ray today, and if it shows any concerning findings, we will order a CT scan. We will continue to monitor for recurrent pneumonia and follow up in three months with Doctor Geronimo.  -CHRONIC OBSTRUCTIVE PULMONARY DISEASE (COPD): COPD is a chronic lung disease that makes it hard to breathe. Your recent lung function test showed a slight decrease to 75% from 76% in 2015, but your diffusion capacity is normal. You should continue using your Trelegy inhaler and monitor your lung function and symptoms.  -GROUND GLASS OPACITY OF LUNG: Ground glass opacity is a term used to describe a hazy area seen on a lung imaging test, which can indicate fluid, inflammation, or infection. You currently have no symptoms of pneumonia. We have ordered a chest x-ray today, and if it shows any concerning findings, we will order a CT scan.  INSTRUCTIONS: Follow up in three months with Doctor Geronimo. If your chest x-ray shows any concerning findings, we will order a CT scan.   "

## 2024-01-15 NOTE — Patient Instructions (Signed)
 Full PFT performed today.

## 2024-01-20 ENCOUNTER — Ambulatory Visit: Payer: Self-pay | Admitting: Primary Care

## 2024-01-20 ENCOUNTER — Other Ambulatory Visit: Payer: Self-pay

## 2024-01-20 ENCOUNTER — Encounter (HOSPITAL_COMMUNITY): Payer: Self-pay

## 2024-01-20 ENCOUNTER — Emergency Department (HOSPITAL_COMMUNITY)

## 2024-01-20 ENCOUNTER — Inpatient Hospital Stay (HOSPITAL_COMMUNITY)
Admission: EM | Admit: 2024-01-20 | Discharge: 2024-01-27 | DRG: 871 | Disposition: A | Discharge status: Home / Self Care | Attending: Internal Medicine | Admitting: Internal Medicine

## 2024-01-20 DIAGNOSIS — F1123 Opioid dependence with withdrawal: Secondary | ICD-10-CM | POA: Diagnosis present

## 2024-01-20 DIAGNOSIS — G5793 Unspecified mononeuropathy of bilateral lower limbs: Secondary | ICD-10-CM | POA: Diagnosis present

## 2024-01-20 DIAGNOSIS — I130 Hypertensive heart and chronic kidney disease with heart failure and stage 1 through stage 4 chronic kidney disease, or unspecified chronic kidney disease: Secondary | ICD-10-CM | POA: Diagnosis present

## 2024-01-20 DIAGNOSIS — L03119 Cellulitis of unspecified part of limb: Secondary | ICD-10-CM

## 2024-01-20 DIAGNOSIS — Z9109 Other allergy status, other than to drugs and biological substances: Secondary | ICD-10-CM

## 2024-01-20 DIAGNOSIS — I10 Essential (primary) hypertension: Secondary | ICD-10-CM | POA: Diagnosis not present

## 2024-01-20 DIAGNOSIS — J440 Chronic obstructive pulmonary disease with acute lower respiratory infection: Secondary | ICD-10-CM | POA: Diagnosis present

## 2024-01-20 DIAGNOSIS — N400 Enlarged prostate without lower urinary tract symptoms: Secondary | ICD-10-CM | POA: Diagnosis present

## 2024-01-20 DIAGNOSIS — E8722 Chronic metabolic acidosis: Secondary | ICD-10-CM | POA: Diagnosis present

## 2024-01-20 DIAGNOSIS — Z9689 Presence of other specified functional implants: Secondary | ICD-10-CM

## 2024-01-20 DIAGNOSIS — Z9079 Acquired absence of other genital organ(s): Secondary | ICD-10-CM

## 2024-01-20 DIAGNOSIS — E66812 Obesity, class 2: Secondary | ICD-10-CM | POA: Diagnosis present

## 2024-01-20 DIAGNOSIS — A419 Sepsis, unspecified organism: Secondary | ICD-10-CM | POA: Diagnosis present

## 2024-01-20 DIAGNOSIS — Z825 Family history of asthma and other chronic lower respiratory diseases: Secondary | ICD-10-CM

## 2024-01-20 DIAGNOSIS — D709 Neutropenia, unspecified: Secondary | ICD-10-CM | POA: Diagnosis present

## 2024-01-20 DIAGNOSIS — G894 Chronic pain syndrome: Secondary | ICD-10-CM | POA: Diagnosis present

## 2024-01-20 DIAGNOSIS — K7460 Unspecified cirrhosis of liver: Secondary | ICD-10-CM | POA: Diagnosis present

## 2024-01-20 DIAGNOSIS — E785 Hyperlipidemia, unspecified: Secondary | ICD-10-CM | POA: Diagnosis present

## 2024-01-20 DIAGNOSIS — Z79899 Other long term (current) drug therapy: Secondary | ICD-10-CM

## 2024-01-20 DIAGNOSIS — R609 Edema, unspecified: Secondary | ICD-10-CM | POA: Diagnosis not present

## 2024-01-20 DIAGNOSIS — N179 Acute kidney failure, unspecified: Secondary | ICD-10-CM | POA: Diagnosis present

## 2024-01-20 DIAGNOSIS — E876 Hypokalemia: Secondary | ICD-10-CM | POA: Diagnosis present

## 2024-01-20 DIAGNOSIS — I2510 Atherosclerotic heart disease of native coronary artery without angina pectoris: Secondary | ICD-10-CM | POA: Diagnosis present

## 2024-01-20 DIAGNOSIS — E872 Acidosis, unspecified: Secondary | ICD-10-CM | POA: Diagnosis present

## 2024-01-20 DIAGNOSIS — Z8249 Family history of ischemic heart disease and other diseases of the circulatory system: Secondary | ICD-10-CM

## 2024-01-20 DIAGNOSIS — Z87891 Personal history of nicotine dependence: Secondary | ICD-10-CM

## 2024-01-20 DIAGNOSIS — D72819 Decreased white blood cell count, unspecified: Secondary | ICD-10-CM | POA: Insufficient documentation

## 2024-01-20 DIAGNOSIS — J9601 Acute respiratory failure with hypoxia: Secondary | ICD-10-CM | POA: Diagnosis present

## 2024-01-20 DIAGNOSIS — G9341 Metabolic encephalopathy: Secondary | ICD-10-CM | POA: Diagnosis present

## 2024-01-20 DIAGNOSIS — Z7982 Long term (current) use of aspirin: Secondary | ICD-10-CM

## 2024-01-20 DIAGNOSIS — L03116 Cellulitis of left lower limb: Secondary | ICD-10-CM | POA: Diagnosis present

## 2024-01-20 DIAGNOSIS — J189 Pneumonia, unspecified organism: Secondary | ICD-10-CM | POA: Diagnosis present

## 2024-01-20 DIAGNOSIS — H3530 Unspecified macular degeneration: Secondary | ICD-10-CM | POA: Diagnosis present

## 2024-01-20 DIAGNOSIS — E7849 Other hyperlipidemia: Secondary | ICD-10-CM

## 2024-01-20 DIAGNOSIS — I5033 Acute on chronic diastolic (congestive) heart failure: Secondary | ICD-10-CM | POA: Diagnosis present

## 2024-01-20 DIAGNOSIS — J438 Other emphysema: Secondary | ICD-10-CM

## 2024-01-20 DIAGNOSIS — Z7951 Long term (current) use of inhaled steroids: Secondary | ICD-10-CM

## 2024-01-20 DIAGNOSIS — B370 Candidal stomatitis: Secondary | ICD-10-CM | POA: Diagnosis present

## 2024-01-20 DIAGNOSIS — K5909 Other constipation: Secondary | ICD-10-CM | POA: Diagnosis present

## 2024-01-20 DIAGNOSIS — M1990 Unspecified osteoarthritis, unspecified site: Secondary | ICD-10-CM | POA: Diagnosis present

## 2024-01-20 DIAGNOSIS — Z923 Personal history of irradiation: Secondary | ICD-10-CM

## 2024-01-20 DIAGNOSIS — R0609 Other forms of dyspnea: Secondary | ICD-10-CM | POA: Diagnosis not present

## 2024-01-20 DIAGNOSIS — R6520 Severe sepsis without septic shock: Secondary | ICD-10-CM | POA: Diagnosis present

## 2024-01-20 DIAGNOSIS — L02426 Furuncle of left lower limb: Secondary | ICD-10-CM | POA: Diagnosis present

## 2024-01-20 DIAGNOSIS — L0390 Cellulitis, unspecified: Secondary | ICD-10-CM

## 2024-01-20 DIAGNOSIS — I5032 Chronic diastolic (congestive) heart failure: Secondary | ICD-10-CM | POA: Diagnosis not present

## 2024-01-20 DIAGNOSIS — Z7984 Long term (current) use of oral hypoglycemic drugs: Secondary | ICD-10-CM

## 2024-01-20 DIAGNOSIS — J449 Chronic obstructive pulmonary disease, unspecified: Secondary | ICD-10-CM | POA: Diagnosis present

## 2024-01-20 DIAGNOSIS — Z888 Allergy status to other drugs, medicaments and biological substances status: Secondary | ICD-10-CM

## 2024-01-20 DIAGNOSIS — H409 Unspecified glaucoma: Secondary | ICD-10-CM | POA: Diagnosis present

## 2024-01-20 DIAGNOSIS — N189 Chronic kidney disease, unspecified: Secondary | ICD-10-CM

## 2024-01-20 DIAGNOSIS — Z91148 Patient's other noncompliance with medication regimen for other reason: Secondary | ICD-10-CM

## 2024-01-20 DIAGNOSIS — R011 Cardiac murmur, unspecified: Secondary | ICD-10-CM | POA: Diagnosis present

## 2024-01-20 DIAGNOSIS — R4182 Altered mental status, unspecified: Secondary | ICD-10-CM | POA: Diagnosis present

## 2024-01-20 DIAGNOSIS — Z8546 Personal history of malignant neoplasm of prostate: Secondary | ICD-10-CM

## 2024-01-20 DIAGNOSIS — Z6835 Body mass index (BMI) 35.0-35.9, adult: Secondary | ICD-10-CM

## 2024-01-20 DIAGNOSIS — Z791 Long term (current) use of non-steroidal anti-inflammatories (NSAID): Secondary | ICD-10-CM

## 2024-01-20 DIAGNOSIS — K219 Gastro-esophageal reflux disease without esophagitis: Secondary | ICD-10-CM | POA: Diagnosis present

## 2024-01-20 DIAGNOSIS — Z886 Allergy status to analgesic agent status: Secondary | ICD-10-CM

## 2024-01-20 DIAGNOSIS — N182 Chronic kidney disease, stage 2 (mild): Secondary | ICD-10-CM | POA: Diagnosis present

## 2024-01-20 LAB — I-STAT ARTERIAL BLOOD GAS, ED
Acid-base deficit: 4 mmol/L — ABNORMAL HIGH (ref 0.0–2.0)
Bicarbonate: 21.9 mmol/L (ref 20.0–28.0)
Calcium, Ion: 1.2 mmol/L (ref 1.15–1.40)
HCT: 36 % — ABNORMAL LOW (ref 39.0–52.0)
Hemoglobin: 12.2 g/dL — ABNORMAL LOW (ref 13.0–17.0)
O2 Saturation: 85 %
Potassium: 3.6 mmol/L (ref 3.5–5.1)
Sodium: 137 mmol/L (ref 135–145)
TCO2: 23 mmol/L (ref 22–32)
pCO2 arterial: 42.8 mmHg (ref 32–48)
pH, Arterial: 7.316 — ABNORMAL LOW (ref 7.35–7.45)
pO2, Arterial: 55 mmHg — ABNORMAL LOW (ref 83–108)

## 2024-01-20 LAB — CBC WITH DIFFERENTIAL/PLATELET
Abs Immature Granulocytes: 0.01 K/uL (ref 0.00–0.07)
Basophils Absolute: 0 K/uL (ref 0.0–0.1)
Basophils Relative: 1 %
Eosinophils Absolute: 0 K/uL (ref 0.0–0.5)
Eosinophils Relative: 1 %
HCT: 48 % (ref 39.0–52.0)
Hemoglobin: 15.9 g/dL (ref 13.0–17.0)
Immature Granulocytes: 1 %
Lymphocytes Relative: 10 %
Lymphs Abs: 0.2 K/uL — ABNORMAL LOW (ref 0.7–4.0)
MCH: 30.9 pg (ref 26.0–34.0)
MCHC: 33.1 g/dL (ref 30.0–36.0)
MCV: 93.4 fL (ref 80.0–100.0)
Monocytes Absolute: 0.2 K/uL (ref 0.1–1.0)
Monocytes Relative: 11 %
Neutro Abs: 1.5 K/uL — ABNORMAL LOW (ref 1.7–7.7)
Neutrophils Relative %: 76 %
Platelets: 297 K/uL (ref 150–400)
RBC: 5.14 MIL/uL (ref 4.22–5.81)
RDW: 13.2 % (ref 11.5–15.5)
WBC: 1.9 K/uL — ABNORMAL LOW (ref 4.0–10.5)
nRBC: 0 % (ref 0.0–0.2)

## 2024-01-20 LAB — COMPREHENSIVE METABOLIC PANEL WITH GFR
ALT: 16 U/L (ref 0–44)
AST: 36 U/L (ref 15–41)
Albumin: 2.6 g/dL — ABNORMAL LOW (ref 3.5–5.0)
Alkaline Phosphatase: 99 U/L (ref 38–126)
Anion gap: 12 (ref 5–15)
BUN: 27 mg/dL — ABNORMAL HIGH (ref 8–23)
CO2: 24 mmol/L (ref 22–32)
Calcium: 8.2 mg/dL — ABNORMAL LOW (ref 8.9–10.3)
Chloride: 102 mmol/L (ref 98–111)
Creatinine, Ser: 1.65 mg/dL — ABNORMAL HIGH (ref 0.61–1.24)
GFR, Estimated: 43 mL/min — ABNORMAL LOW
Glucose, Bld: 78 mg/dL (ref 70–99)
Potassium: 3.8 mmol/L (ref 3.5–5.1)
Sodium: 137 mmol/L (ref 135–145)
Total Bilirubin: 0.7 mg/dL (ref 0.0–1.2)
Total Protein: 4.6 g/dL — ABNORMAL LOW (ref 6.5–8.1)

## 2024-01-20 LAB — I-STAT CG4 LACTIC ACID, ED
Lactic Acid, Venous: 2.9 mmol/L (ref 0.5–1.9)
Lactic Acid, Venous: 3.2 mmol/L (ref 0.5–1.9)

## 2024-01-20 LAB — RESP PANEL BY RT-PCR (RSV, FLU A&B, COVID)  RVPGX2
Influenza A by PCR: NEGATIVE
Influenza B by PCR: NEGATIVE
Resp Syncytial Virus by PCR: NEGATIVE
SARS Coronavirus 2 by RT PCR: NEGATIVE

## 2024-01-20 LAB — PROTIME-INR
INR: 1.3 — ABNORMAL HIGH (ref 0.8–1.2)
Prothrombin Time: 16.5 s — ABNORMAL HIGH (ref 11.4–15.2)

## 2024-01-20 MED ORDER — ENOXAPARIN SODIUM 40 MG/0.4ML IJ SOSY
40.0000 mg | PREFILLED_SYRINGE | INTRAMUSCULAR | Status: DC
  Administered 2024-01-20: 40 mg via SUBCUTANEOUS
  Filled 2024-01-20: qty 0.4

## 2024-01-20 MED ORDER — UMECLIDINIUM-VILANTEROL 62.5-25 MCG/ACT IN AEPB: 1.0000 | INHALATION_SPRAY | Freq: Every day | RESPIRATORY_TRACT | Status: DC

## 2024-01-20 MED ORDER — DOCUSATE SODIUM 100 MG PO CAPS
100.0000 mg | ORAL_CAPSULE | Freq: Two times a day (BID) | ORAL | Status: DC
  Administered 2024-01-20 – 2024-01-22 (×4): 100 mg via ORAL
  Filled 2024-01-20 (×4): qty 1

## 2024-01-20 MED ORDER — ROSUVASTATIN CALCIUM 20 MG PO TABS
20.0000 mg | ORAL_TABLET | Freq: Every day | ORAL | Status: DC
  Administered 2024-01-20 – 2024-01-26 (×7): 20 mg via ORAL
  Filled 2024-01-20 (×7): qty 1

## 2024-01-20 MED ORDER — ACETAMINOPHEN 325 MG PO TABS
650.0000 mg | ORAL_TABLET | Freq: Four times a day (QID) | ORAL | Status: DC | PRN
  Administered 2024-01-21 – 2024-01-24 (×2): 650 mg via ORAL
  Filled 2024-01-20 (×2): qty 2

## 2024-01-20 MED ORDER — PANTOPRAZOLE SODIUM 40 MG PO TBEC
40.0000 mg | DELAYED_RELEASE_TABLET | Freq: Every day | ORAL | Status: DC
  Administered 2024-01-20 – 2024-01-27 (×8): 40 mg via ORAL
  Filled 2024-01-20 (×8): qty 1

## 2024-01-20 MED ORDER — IPRATROPIUM-ALBUTEROL 0.5-2.5 (3) MG/3ML IN SOLN
3.0000 mL | RESPIRATORY_TRACT | Status: DC | PRN
  Administered 2024-01-21: 3 mL via RESPIRATORY_TRACT
  Filled 2024-01-20: qty 3

## 2024-01-20 MED ORDER — VANCOMYCIN HCL 2000 MG/400ML IV SOLN
2000.0000 mg | Freq: Once | INTRAVENOUS | Status: AC
  Administered 2024-01-20: 2000 mg via INTRAVENOUS
  Filled 2024-01-20: qty 400

## 2024-01-20 MED ORDER — VANCOMYCIN VARIABLE DOSE PER UNSTABLE RENAL FUNCTION (PHARMACIST DOSING): Status: DC

## 2024-01-20 MED ORDER — MIDODRINE HCL 5 MG PO TABS
10.0000 mg | ORAL_TABLET | ORAL | Status: AC
  Administered 2024-01-20: 10 mg via ORAL
  Filled 2024-01-20: qty 2

## 2024-01-20 MED ORDER — LACTATED RINGERS IV BOLUS (SEPSIS)
1000.0000 mL | Freq: Once | INTRAVENOUS | Status: AC
  Administered 2024-01-20: 1000 mL via INTRAVENOUS

## 2024-01-20 MED ORDER — IOHEXOL 350 MG/ML SOLN
75.0000 mL | Freq: Once | INTRAVENOUS | Status: AC | PRN
  Administered 2024-01-20: 75 mL via INTRAVENOUS

## 2024-01-20 MED ORDER — VANCOMYCIN HCL IN DEXTROSE 1-5 GM/200ML-% IV SOLN: 1000.0000 mg | Freq: Once | INTRAVENOUS | Status: DC

## 2024-01-20 MED ORDER — ALBUMIN HUMAN 25 % IV SOLN
25.0000 g | INTRAVENOUS | Status: AC
  Administered 2024-01-20: 25 g via INTRAVENOUS
  Filled 2024-01-20: qty 100

## 2024-01-20 MED ORDER — SODIUM CHLORIDE 0.9% FLUSH: 3.0000 mL | INTRAVENOUS | Status: DC | PRN

## 2024-01-20 MED ORDER — SODIUM CHLORIDE 0.9% FLUSH: 3.0000 mL | Freq: Two times a day (BID) | INTRAVENOUS | Status: DC

## 2024-01-20 MED ORDER — SIMETHICONE 80 MG PO CHEW: 240.0000 mg | CHEWABLE_TABLET | Freq: Every day | ORAL | Status: DC | PRN

## 2024-01-20 MED ORDER — ONDANSETRON HCL 4 MG/2ML IJ SOLN
4.0000 mg | Freq: Four times a day (QID) | INTRAMUSCULAR | Status: DC | PRN
  Administered 2024-01-23: 4 mg via INTRAVENOUS
  Filled 2024-01-20: qty 2

## 2024-01-20 MED ORDER — SODIUM CHLORIDE 0.9 % IV SOLN
2.0000 g | Freq: Once | INTRAVENOUS | Status: AC
  Administered 2024-01-20: 2 g via INTRAVENOUS
  Filled 2024-01-20: qty 20

## 2024-01-20 MED ORDER — LACTATED RINGERS IV BOLUS
1000.0000 mL | INTRAVENOUS | Status: AC
  Administered 2024-01-20: 1000 mL via INTRAVENOUS

## 2024-01-20 MED ORDER — SODIUM CHLORIDE 0.9 % IV BOLUS (SEPSIS)
500.0000 mL | Freq: Once | INTRAVENOUS | Status: AC
  Administered 2024-01-20: 500 mL via INTRAVENOUS

## 2024-01-20 MED ORDER — LACTATED RINGERS IV BOLUS (SEPSIS)
500.0000 mL | Freq: Once | INTRAVENOUS | Status: AC
  Administered 2024-01-20: 500 mL via INTRAVENOUS

## 2024-01-20 MED ORDER — SODIUM CHLORIDE 0.9 % IV SOLN: 250.0000 mL | INTRAVENOUS | Status: AC | PRN

## 2024-01-20 MED ORDER — SODIUM CHLORIDE 0.9 % IV SOLN
2.0000 g | Freq: Two times a day (BID) | INTRAVENOUS | Status: DC
  Administered 2024-01-20 – 2024-01-21 (×2): 2 g via INTRAVENOUS
  Filled 2024-01-20 (×2): qty 12.5

## 2024-01-20 MED ORDER — ACETAMINOPHEN 650 MG RE SUPP: 650.0000 mg | Freq: Four times a day (QID) | RECTAL | Status: DC | PRN

## 2024-01-20 MED ORDER — ONDANSETRON HCL 4 MG PO TABS
4.0000 mg | ORAL_TABLET | Freq: Four times a day (QID) | ORAL | Status: DC | PRN
  Administered 2024-01-26: 4 mg via ORAL
  Filled 2024-01-20: qty 1

## 2024-01-20 MED ORDER — LACTATED RINGERS IV SOLN: INTRAVENOUS | Status: DC

## 2024-01-20 MED ORDER — SENNOSIDES-DOCUSATE SODIUM 8.6-50 MG PO TABS: 1.0000 | ORAL_TABLET | Freq: Every evening | ORAL | Status: DC | PRN

## 2024-01-20 NOTE — ED Notes (Signed)
 Pt given urinal and asked to provide sample. EDP at bedside, ok to hold off in/out cath order at this time.

## 2024-01-20 NOTE — Progress Notes (Signed)
 Please let patient know CXR showed persistent left lung opacity, please order CT chest wo contrast re: recurrent pneumonia

## 2024-01-20 NOTE — ED Triage Notes (Signed)
 Pt bib [facility] coming from home. Pt reports having boil on posterior left leg that has been there for about a week. Yesterday, pt's wife reports to EMS that pt has been altered, short of breath, and lethargic. EMS reports pt hypotensive with them (70 sbp) and short of breath (80% on RA). Pt oriented x4 during triage but lethargic. Boil noted to back of left leg that is warm to the touch/red. GCS 14.  EMS VS: 116 HR 104/82 140 cbg 94% 6L  100.4 temp 1L LR given

## 2024-01-20 NOTE — ED Notes (Signed)
EDP notified of possible code sepsis

## 2024-01-20 NOTE — H&P (Addendum)
 " History and Physical    Cody Tate FMW:[id number] DOB: 19-Nov-1950 DOA: 01/20/2024  PCP: Charlott Dorn LABOR, MD   Patient coming from: Home   Chief Complaint:  Chief Complaint  Patient presents with   Altered Mental Status   ED TRIAGE note:Pt bib [facility] coming from home. Pt reports having boil on posterior left leg that has been there for about a week. Yesterday, pt's wife reports to EMS that pt has been altered, short of breath, and lethargic. EMS reports pt hypotensive with them (70 sbp) and short of breath (80% on RA). Pt oriented x4 during triage but lethargic. Boil noted to back of left leg that is warm to the touch/red. GCS 14.   EMS VS: 116 HR 104/82 140 cbg 94% 6L  100.4 temp 1L LR give  HPI:  Cody Tate is a 74 y.o. male with medical history significant of COPD, essential hypertension, chronic lower extremity edema, diastolic heart failure, CAD on medical management, hepatic cirrhosis peripheral neuropathy, GERD, chronic pain syndrome, hyperlipidemia and chronic constipation presented to emergency department with multiple complaint include left leg boil for 1 week, altered mental status, shortness of breath, cough and lethargy.  EMS reported that patient blood pressure is soft in low 70s and shortness of breath O2 sat 80% room air.  In the ED patient found alert oriented x 4 however lethargic.  Wife at the bedside reported that for last few days patient is more lethargic and drowsy as compared to his baseline.  He has been also taking pain medications as well.  Wife denies any fever and chill at home.  Patient is complaining about some cough with associated shortness of breath.  Denies any abdominal pain, nausea, vomiting, fever and chills.  Denies chest pain, palpitation.  He has lower extremity swelling at the baseline left side more swelled as compared to right side which is chronic in nature. Patient's wife also noticed left extremity infection/boil 1 week ago.   There is no rupture or pus formation.  ED Course:  At presentation to ED patient found persistently tachycardic heart rate up to 117, tachypneic up to 40, borderline hypotensive O2 sat 91 to 90% on 2 L and EMS reported O2 sat 80% room air initially.  Lab work, lactic acid persistently elevated 2.9 and 3.2.  Per chart review patient has chronically elevated lactic acid upper 2-3 range.  CMP showing elevated creatinine 1.65, elevated BUN 37 otherwise unremarkable.  Elevated pro time INR.  Blood cultures are pending.  Respiratory panel negative.  CBC showing low WBC count 1.9, low absolute neutrophil count 1.5.  Normal platelet and H&H.  Pending UA.   Pending CTA chest. Pending CT femur of the left lower extremity.  Chest x-ray showed: Widespread heterogeneous and slightly nodular consolidation and ground-glass disease throughout the left thorax, worse compared with 01/15/2024. Findings could be due to pneumonia, imaging follow-up to resolution is advised.   In the ED patient received 2.5 L of LR bolus per sepsis protocol, ceftriaxone , vancomycin . Physical exam revealed left thigh cellulitis without focal abscess formation on bedside ultrasound by ED physician evaluation.  Pending CT femur of the left lower extremity.  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   Significant labs in the ED: Lab Orders         Resp panel by RT-PCR (  RSV, Flu A&B, Covid) Anterior Nasal Swab         Blood Culture (routine x 2)         Expectorated Sputum Assessment w Gram Stain, Rflx to Resp Cult         CBC with Differential         Urinalysis, w/ Reflex to Culture (Infection Suspected) -Urine, Clean Catch         Comprehensive metabolic panel with GFR         Protime-INR         CBC          Comprehensive metabolic panel         Lactic acid, plasma         Legionella Pneumophila Serogp 1 Ur Ag         Strep pneumoniae urinary antigen         Procalcitonin         Blood gas, arterial         Ammonia         I-Stat Lactic Acid, ED       Review of Systems:  Review of Systems  Constitutional:  Positive for malaise/fatigue. Negative for chills, fever and weight loss.  Respiratory:  Positive for cough, sputum production and shortness of breath. Negative for wheezing.   Cardiovascular:  Positive for leg swelling. Negative for chest pain.  Gastrointestinal:  Negative for abdominal pain, heartburn, nausea and vomiting.  Musculoskeletal:  Negative for joint pain, myalgias and neck pain.  Neurological:  Negative for dizziness and headaches.  Psychiatric/Behavioral:  The patient is not nervous/anxious.     Past Medical History:  Diagnosis Date   Acute heart failure with preserved ejection fraction (HCC) 11/12/2022   Arthritis    Back pain    Radiates down both legs   BPH (benign prostatic hyperplasia)    Cancer (HCC) 2022   prostate cancer   COPD (chronic obstructive pulmonary disease) (HCC)    Dyspnea    Enlarged prostate    GERD (gastroesophageal reflux disease)    Glaucoma    H/O hiatal hernia    H/O wheezing    occ inhaler usage   Heart murmur    Hypertension    Lumbar foraminal stenosis    Macular degeneration    Meningioma (HCC) 2014   Treated with radiation   Neuromuscular disorder (HCC)    Neuropathy bilateral legs   PONV (postoperative nausea and vomiting)    Pulmonary nodules    resolved on 2019 follow up CT   S/P insertion of spinal cord stimulator     Past Surgical History:  Procedure Laterality Date   ABDOMINAL EXPOSURE N/A 03/16/2022   Procedure: ABDOMINAL EXPOSURE;  Surgeon: Gretta Lonni PARAS, MD;  Location: [facility] OR;  Service: Vascular;  Laterality: N/A;   ANTERIOR LUMBAR FUSION N/A 03/16/2022   Procedure: Anterior Lumbar Interbody Fusion -  Lumbar Five-Sacral One with cage;  Surgeon: Louis Shove, MD;  Location: [facility] OR;  Service: Neurosurgery;  Laterality: N/A;   BACK SURGERY  06/08/2006   lam x3 cerv x 1   CERVICAL FUSION     COLONOSCOPY  2020   CYSTOSCOPY     74 yrs old   CYSTOSCOPY WITH URETHRAL DILATATION N/A 09/21/2021   Procedure: CYSTOSCOPY WITH BALLOON DILATION and OPTILUME BALLOON DILATION OF  URETHRAL DILATATION;  Surgeon: Elisabeth Valli BIRCH, MD;  Location: [facility] ORS;  Service: Urology;  Laterality: N/A;   EYE SURGERY  bil   GOLD SEED IMPLANT N/A 09/06/2020   Procedure: GOLD SEED IMPLANT/ PLACEMENT OF FIDUCIAL MARKERS;  Surgeon: Elisabeth Valli BIRCH, MD;  Location: [facility] ORS;  Service: Urology;  Laterality: N/A;   KNEE ARTHROSCOPY  01/02/2008   lft   KNEE ARTHROSCOPY Left 07/23/2012   Procedure: LEFT MEDIAL MENISCAL DEBRIDEMENT AND CHONDROPLASTY;  Surgeon: Dempsey LULLA Moan, MD;  Location: [facility] ORS;  Service: Orthopedics;  Laterality: Left;   LUMBAR FUSION  02/22/2014   DR POOL   PILONIDAL CYST EXCISION     SINUS EXPLORATION     SPACE OAR INSTILLATION N/A 09/06/2020   Procedure: SPACE OAR INSTILLATION;  Surgeon: Elisabeth Valli BIRCH, MD;  Location: [facility];  Service: Urology;  Laterality: N/A;   SPINAL CORD STIMULATOR INSERTION N/A 01/16/2018   Procedure: LUMBAR SPINAL CORD STIMULATOR INSERTION;  Surgeon: Joshua Alm RAMAN, MD;  Location: [facility] OR;  Service: Neurosurgery;  Laterality: N/A;  LUMBAR SPINAL CORD STIMULATOR INSERTION   TRANSURETHRAL RESECTION OF PROSTATE N/A 09/06/2020   Procedure: TRANSURETHRAL RESECTION OF THE PROSTATE (TURP);  Surgeon: Elisabeth Valli BIRCH, MD;  Location: [facility] OR[facility];  Service: Urology;  Laterality: N/A;  2 HRS   WRIST GANGLION EXCISION     rt   WRIST SURGERY  01/02/2007   cyst lft     reports that he quit smoking about 23 years ago. His smoking use included cigarettes. He started smoking about 58 years ago. He has a 52.5 pack-year smoking history. He has never used smokeless tobacco. He reports that he does  not currently use alcohol . He reports that he does not use drugs.  Allergies[1]  Family History  Problem Relation Age of Onset   Hypertension Mother    Bone cancer Mother    Stroke Father 52   COPD Maternal Uncle     Prior to Admission medications  Medication Sig Start Date End Date Taking? Authorizing Provider  AFRIN 12 HOUR 0.05 % nasal spray Place 1 spray into both nostrils 2 (two) times daily as needed for congestion.    [provider]  Ascorbic Acid  (VITAMIN C ) 1000 MG tablet Take 1,000 mg by mouth daily. 12/31/18   [provider]  aspirin  EC 81 MG tablet Take 1 tablet (81 mg total) by mouth daily. Swallow whole. Patient not taking: Reported on 01/15/2024 03/21/23   Williams, Evan, PA-C  carvedilol (COREG) 6.25 MG tablet Take 6.25 mg by mouth 2 (two) times daily with a meal.    [provider]  celecoxib  (CELEBREX ) 200 MG capsule Take 200-400 mg by mouth daily.    [provider]  chlorhexidine  (HIBICLENS ) 4 % external liquid Apply 1 Application topically daily as needed (boils).    [provider]  Cholecalciferol  (VITAMIN D3) 50 MCG (2000 UT) capsule Take 4,000 Units by mouth daily. 12/22/18   [provider]  ciprofloxacin  (CIPRO ) 500 MG tablet Take 1 tablet (500 mg total) by mouth 2 (two) times daily. Patient not taking: Reported on 01/15/2024 11/14/23   Hope Almarie ORN, NP  COMBIVENT  RESPIMAT 20-100 MCG/ACT AERS respimat INHALE 1 INHALATION BY MOUTH  INTO THE LUNGS EVERY 6 HOURS AS  NEEDED FOR WHEEZING OR SHORTNESS OF BREATH 08/21/23   Hunsucker, Donnice SAUNDERS, MD  diphenhydrAMINE  (BENADRYL  ALLERGY ) 25 MG tablet Take 25 mg by mouth at bedtime.    [provider]  doxycycline  (VIBRAMYCIN ) 100 MG capsule Take 1 capsule (100 mg total) by mouth 2 (two) times daily. Patient not taking: Reported on 01/15/2024 08/25/23  Mannie Pac T, DO  finasteride  (PROSCAR ) 5 MG tablet Take 5 mg by mouth daily. Patient not taking:  Reported on 01/15/2024 05/13/20   [provider]  fluticasone  (FLONASE ) 50 MCG/ACT nasal spray Place 1 spray into both nostrils 2 (two) times daily.    [provider]  Fluticasone -Umeclidin-Vilant (TRELEGY ELLIPTA ) 200-62.5-25 MCG/ACT AEPB Inhale 1 puff into the lungs daily.    [provider]  furosemide  (LASIX ) 20 MG tablet Take 1 tablet (20 mg total) by mouth 2 (two) times daily for 7 days, THEN 1 tablet (20 mg total) daily for 21 days. Patient taking differently: Take 20 mg by mouth once daily.  05/28/23 01/15/24  Laurence Locus, DO  gabapentin  (NEURONTIN ) 300 MG capsule Take 1 capsule (300 mg total) by mouth 4 (four) times daily. 05/28/23 01/15/24  Laurence Locus, DO  Guaifenesin  (MUCINEX  MAXIMUM STRENGTH) 1200 MG TB12 Take 1,200 mg by mouth 2 (two) times daily.    [provider]  JARDIANCE  10 MG TABS tablet Take 10 mg by mouth daily.    [provider]  Lutein-Zeaxanthin 25-5 MG CAPS Take 1 capsule by mouth daily.    [provider]  magic mouthwash w/lidocaine  SOLN Take 15 mLs by mouth 4 (four) times daily as needed for mouth pain. contains equal amounts of Maalox Extra Strength, nystatin, diphenhydramine  and lidocaine . Patient not taking: Reported on 01/15/2024 05/28/23   Laurence Locus, DO  magnesium  hydroxide (MILK OF MAGNESIA) 400 MG/5ML suspension Take 15 mLs by mouth at bedtime.    [provider]  metoprolol  succinate (TOPROL -XL) 100 MG 24 hr tablet Take 1 tablet (100 mg total) by mouth at bedtime. Take with or immediately following a meal. Patient not taking: Reported on 01/15/2024 05/28/23 11/19/23  Laurence Locus, DO  montelukast  (SINGULAIR ) 10 MG tablet TAKE 1 TABLET BY MOUTH AT  BEDTIME 12/21/22   Hunsucker, Donnice SAUNDERS, MD  Multiple Vitamin (MULTIVITAMIN WITH MINERALS) TABS tablet Take 1 tablet by mouth daily.    [provider]  omeprazole  (PRILOSEC) 40 MG capsule Take 1 capsule (40 mg total) by mouth daily before breakfast. 09/20/20    Hunsucker, Donnice SAUNDERS, MD  Oxycodone  HCl 10 MG TABS Take 10 mg by mouth every 4 (four) hours. 08/13/19   [provider]  Propylene Glycol, PF, (SYSTANE COMPLETE PF) 0.6 % SOLN Place 2 drops into both eyes daily as needed (dry eyes).    [provider]  rosuvastatin  (CRESTOR ) 20 MG tablet Take 1 tablet (20 mg total) by mouth at bedtime. 05/28/23 01/15/24  Laurence Locus, DO  Simethicone  125 MG CAPS Take 250 mg by mouth daily as needed (for gas).    [provider]  spironolactone  (ALDACTONE ) 25 MG tablet Take 12.5 mg by mouth daily. 09/11/23   [provider]  Tamsulosin  HCl (FLOMAX ) 0.4 MG CAPS Take 0.4 mg by mouth 2 (two) times daily.    [provider]  telmisartan  (MICARDIS ) 80 MG tablet Take 0.5 tablets (40 mg total) by mouth 2 (two) times daily. 05/28/23 01/15/24  Laurence Locus, DO  timolol  (TIMOPTIC ) 0.5 % ophthalmic solution Place 1 drop into both eyes at bedtime. 10/12/22   [provider]     Physical Exam: Vitals:   01/20/24 1918 01/20/24 1930 01/20/24 1940 01/20/24 1945  BP:  (!) 81/45 (!) 96/47 (!) 87/45  Pulse: (!) 109 (!) 111 (!) 111 (!) 109  Resp: (!) 33 (!) 32 (!) 35 (!) 22  Temp:      TempSrc:  SpO2: 90% 98% 91% 92%  Weight:      Height:        Physical Exam Vitals reviewed.  Constitutional:      General: He is not in acute distress.    Appearance: He is obese. He is ill-appearing.  HENT:     Mouth/Throat:     Mouth: Mucous membranes are moist.  Eyes:     Pupils: Pupils are equal, round, and reactive to light.  Cardiovascular:     Rate and Rhythm: Normal rate and regular rhythm.     Pulses: Normal pulses.     Heart sounds: Normal heart sounds.  Pulmonary:     Effort: Pulmonary effort is normal.     Breath sounds: Normal breath sounds.  Abdominal:     Palpations: Abdomen is soft.  Musculoskeletal:        General: Swelling present.     Cervical back: Neck supple.     Right lower leg: Edema present.     Left  lower leg: Edema present.  Skin:    General: Skin is warm.     Capillary Refill: Capillary refill takes less than 2 seconds.  Neurological:     Mental Status: He is oriented to person, place, and time.     Comments: Lethargy however patient is alert oriented x 3  Psychiatric:        Mood and Affect: Mood normal.      Labs on Admission: I have personally reviewed following labs and imaging studies  CBC: Recent Labs  Lab 01/20/24 1441  WBC 1.9*  NEUTROABS 1.5*  HGB 15.9  HCT 48.0  MCV 93.4  PLT 297   Basic Metabolic Panel: Recent Labs  Lab 01/20/24 1744  NA 137  K 3.8  CL 102  CO2 24  GLUCOSE 78  BUN 27*  CREATININE 1.65*  CALCIUM  8.2*   GFR: Estimated Creatinine Clearance: 44.1 mL/min (A) (by C-G formula based on SCr of 1.65 mg/dL (H)). Liver Function Tests: Recent Labs  Lab 01/20/24 1744  AST 36  ALT 16  ALKPHOS 99  BILITOT 0.7  PROT 4.6*  ALBUMIN  2.6*   No results for input(s): LIPASE, AMYLASE in the last 168 hours. No results for input(s): AMMONIA in the last 168 hours. Coagulation Profile: Recent Labs  Lab 01/20/24 1744  INR 1.3*   Cardiac Enzymes: No results for input(s): CKTOTAL, CKMB, CKMBINDEX, TROPONINI, TROPONINIHS in the last 168 hours. BNP (last 3 results) Recent Labs    05/22/23 1005 06/28/23 0449  BNP 165.8* 110.7*   HbA1C: No results for input(s): HGBA1C in the last 72 hours. CBG: No results for input(s): GLUCAP in the last 168 hours. Lipid Profile: No results for input(s): CHOL, HDL, LDLCALC, TRIG, CHOLHDL, LDLDIRECT in the last 72 hours. Thyroid  Function Tests: No results for input(s): TSH, T4TOTAL, FREET4, T3FREE, THYROIDAB in the last 72 hours. Anemia Panel: No results for input(s): VITAMINB12, FOLATE, FERRITIN, TIBC, IRON, RETICCTPCT in the last 72 hours. Urine analysis:    Component Value Date/Time   COLORURINE AMBER (A) 05/22/2023 2017   APPEARANCEUR CLOUDY (A)  05/22/2023 2017   LABSPEC 1.027 05/22/2023 2017   PHURINE 5.0 05/22/2023 2017   GLUCOSEU >=500 (A) 05/22/2023 2017   HGBUR NEGATIVE 05/22/2023 2017   BILIRUBINUR NEGATIVE 05/22/2023 2017   KETONESUR NEGATIVE 05/22/2023 2017   PROTEINUR 100 (A) 05/22/2023 2017   NITRITE NEGATIVE 05/22/2023 2017   LEUKOCYTESUR NEGATIVE 05/22/2023 2017    Radiological Exams on Admission: I have personally  reviewed images CT FEMUR LEFT W CONTRAST Result Date: 01/20/2024 EXAM: CT LEFT THIGH, WITH IV CONTRAST 01/20/2024 07:24:00 PM TECHNIQUE: Axial images were acquired through the left thigh with IV contrast. Reformatted images were reviewed. Automated exposure control, iterative reconstruction, and/or weight based adjustment of the mA/kV was utilized to reduce the radiation dose to as low as reasonably achievable. 75 mL (iohexol  (OMNIPAQUE ) 350 MG/ML injection 75 mL IOHEXOL  350 MG/ML SOLN) was administered intravenously. COMPARISON: None available. CLINICAL HISTORY: Soft tissue mass, thigh, deep FINDINGS: BONES AND JOINTS: No acute fracture or focal osseous lesion. No dislocation. The joint spaces are normal. SOFT TISSUES: No evidence of left lower extremity DVT in the visualized venous structures. Arterial atherosclerosis in the left iliofemoral vessels. Stranding within the subcutaneous soft tissues medially in the left upper to mid thigh. In the appropriate clinical setting, this could reflect cellulitis. No focal fluid collection. IMPRESSION: 1. Stranding within the subcutaneous soft tissues medially in the left upper to mid thigh, which could reflect cellulitis. No focal fluid collection. Electronically signed by: Franky Crease MD 01/20/2024 07:43 PM EST RP Workstation: [id number]   CT Angio Chest PE W/Cm &/Or Wo Cm Result Date: 01/20/2024 EXAM: CTA CHEST 01/20/2024 07:24:00 PM TECHNIQUE: CTA of the chest was performed without and with the administration of 75 mL of iohexol  (OMNIPAQUE ) 350 MG/ML injection. Multiplanar  reformatted images are provided for review. MIP images are provided for review. Automated exposure control, iterative reconstruction, and/or weight based adjustment of the mA/kV was utilized to reduce the radiation dose to as low as reasonably achievable. COMPARISON: None available. CLINICAL HISTORY: Pulmonary embolism (PE) suspected, high prob. FINDINGS: PULMONARY ARTERIES: Pulmonary arteries are adequately opacified for evaluation. No acute pulmonary embolus. Main pulmonary artery is normal in caliber. MEDIASTINUM: The heart and pericardium demonstrate no acute abnormality. Coronary artery and aortic atherosclerosis. There is no acute abnormality of the thoracic aorta. LYMPH NODES: No mediastinal, hilar or axillary lymphadenopathy. LUNGS AND PLEURA: Consolidation throughout the left lung compatible with pneumonia. Patchy ground glass opacities also noted in the right upper lobe, also likely pneumonia. No pleural effusions. No pneumothorax. UPPER ABDOMEN: Limited images of the upper abdomen are unremarkable. SOFT TISSUES AND BONES: No acute bone or soft tissue abnormality. IMPRESSION: 1. No pulmonary embolism. 2. Consolidation throughout the left lung and patchy ground glass opacities in the right upper lobe, compatible with pneumonia. 3. Coronary artery disease, aortic atherosclerosis. Electronically signed by: Franky Crease MD 01/20/2024 07:40 PM EST RP Workstation: [id number]   DG Chest Port 1 View if patient is in a treatment room. Result Date: 01/20/2024 CLINICAL DATA:  Suspected sepsis EXAM: PORTABLE CHEST 1 VIEW COMPARISON:  01/15/2024, chest CT 11/06/2023 FINDINGS: Hardware in the cervical spine. Borderline cardiomegaly with aortic atherosclerosis. Widespread heterogeneous and slightly nodular consolidation and ground-glass disease throughout the left thorax, worse compared with 01/15/2024. Possible trace left effusion. No pneumothorax. Possible calcified loose bodies about the left shoulder. IMPRESSION:  Widespread heterogeneous and slightly nodular consolidation and ground-glass disease throughout the left thorax, worse compared with 01/15/2024. Findings could be due to pneumonia, imaging follow-up to resolution is advised Electronically Signed   By: Luke Bun M.D.   On: 01/20/2024 15:44     EKG: My personal interpretation of EKG shows: Sinus tachycardia heart rate 117, right bundle branch block.    Assessment/Plan: Principal Problem:   Sepsis (HCC) Active Problems:   CAP (community acquired pneumonia)   Cellulitis of left lower extremity   Essential hypertension   Acute hypoxic respiratory  failure (HCC)   Acute kidney injury superimposed on chronic kidney disease   S/P insertion of spinal cord stimulator   COPD (chronic obstructive pulmonary disease) (HCC)   Hyperlipidemia   Chronic diastolic CHF (congestive heart failure) (HCC)   Leukopenia   Neutropenia    Assessment and Plan: Sepsis secondary to pneumonia and cellulitis Cellulitis of the left lower extremity -Patient presenting to emergency department complaining of fever, cough, shortness of breath, lethargy and left lower extremity boil that developed few days ago per patient's wife.  EMS found patient hypoxic O2 sat 80% room air and systolic blood pressure in low 70s.  At presentation to ED patient remained tachycardic, tachypneic, hypertensive and afebrile. -Lab work, lactic acid persistently elevated 2.9 and 3.2.  Per chart review patient has chronically elevated lactic acid upper 2-3 range. -CMP showing elevated creatinine 1.65, elevated BUN 37 otherwise unremarkable.  Elevated pro time INR.  Blood cultures are pending.  Respiratory panel negative.  CBC showing low WBC count 1.9, low absolute neutrophil count 1.5.  Normal platelet and H&H.  Pending UA. -  Pending CTA chest. Pending CT femur of the left lower extremity. -Chest x-ray showed: Widespread heterogeneous and slightly nodular consolidation and ground-glass  disease throughout the left thorax, worse compared with 01/15/2024. Findings could be due to pneumonia, imaging follow-up to resolution is advised. -In the ED patient received 2.5 L of LR bolus per sepsis protocol, ceftriaxone , vancomycin . -Physical exam revealed left thigh cellulitis without focal abscess formation on bedside ultrasound by ED physician evaluation.  Pending CT femur of the left lower extremity. - Continue IV vancomycin  and cefepime .  Need to follow-up with blood culture result -Code sepsis has been activated in the ED.  Patient received 2.5 L of LR bolus, lactic acid still elevated giving another liter of LR bolus followed by continue maintenance fluid LR at 50 cc/h - Need to follow-up with blood cultures result and CT scan of the left lower extremity. Addendum -CT femur showing left upper thigh cellulitis.  No focal fluid collection.-Continue aggressive fluid resuscitation and broad-spectrum antibiotic coverage.    Acute hypoxic respiratory failure secondary to pneumonia Community-acquired pneumonia -Acute hypoxic respiratory failure in the context of pneumonia. -Patient met sepsis criteria. - Pending blood culture, sputum culture, urine Legionella and strep and procalcitonin level. - Continue vancomycin  and cefepime  with pharmacy protocol - Need to follow-up with culture results.  Resuscitating with fluid per sepsis protocol.  Need to follow-up with lactic acid level. Addendum - CTA chest rule out PE.  It showed consolidation of the left lung and opacities of the right upper lung compatible with pneumonia.   Altered mental status in terms of lethargy and drowsiness - During my evaluation at the bedside patient is alert oriented x 4 however he does back to sleep very quickly.  He is answering question appropriately.  Wife at the bedside reported that for last few days patient is more drowsy/sleepy as compared to his baseline. - In the setting of hypoxia transitioning to high  flow oxygen to keep O2 sat above 92% - Obtaining ABG to assess blood pCO2 level, checking ammonia level. - If ABG showing high pCO2 need to place BiPAP however patient has high risk for aspiration as well will need a bedside TeleSitter to monitor mental status while on BiPAP.   Essential hypertension Chronic diastolic heart failure -At home patient is not taking neither Lopressor , Toprol -XL, Lasix . -In the setting of sepsis holding home initiating any home blood pressure regimen.  History of COPD Acute hypoxic respiratory failure in the setting of pneumonia - Physical exam no evidence of wheezing in COPD exacerbation at this time.  Continue to treat for pneumonia. - Continue DuoNeb as needed and Anoro Ellipta  daily. - Continue supportive care  Leukopenia and neutropenia -WBC count 1.9 and absolute neutrophil count 1.5.  Continue to monitor.  Currently on broad-spectrum antibiotic coverage with IV vancomycin  and cefepime .  Pending blood culture, sputum culture and checking UA.   AKI CKD stage II -Elevated creatinine 1.65.  Prerenal acute kidney injury in the setting of sepsis.  Checking UA, urine creatinine and sodium.  Continue IV fluid resuscitation, monitor urine output.  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  Patient has been prescribed Lasix  and spironolactone  however per patient's wife he is not taking any blood pressure regimen.   Hyperlipidemia - Continue Crestor   GERD -Continue Protonix   DVT prophylaxis:  Lovenox  Code Status:  Full Code Diet: Heart healthy  diet Family Communication:   Family was present at bedside, at the time of interview. Opportunity was given to ask question and all questions were answered satisfactorily.  Disposition Plan: Need to follow-up with culture results, CTA chest and CT femur result Consults: None indicated at this time Admission status:   Inpatient, Step Down Unit  Severity of Illness: The appropriate patient status for this patient is INPATIENT. Inpatient status is judged to be reasonable and necessary in order to provide the required intensity of service to ensure the patient's safety. The patient's presenting symptoms, physical exam findings, and initial radiographic and laboratory data in the context of their chronic comorbidities is felt to place them at high risk for further clinical deterioration. Furthermore, it is not anticipated that the patient will be medically stable for discharge from the hospital within 2 midnights of admission.   * I certify that at the point of admission it is my clinical judgment that the patient will require inpatient hospital care spanning beyond 2 midnights from the point of admission due to high intensity of service, high risk for further deterioration and high frequency of surveillance required.DEWAINE    Saretta Dahlem, MD [facility]  How to contact the [facility] Attending or Consulting provider 7A - 7P or covering provider during after hours 7P -7A, for this patient.  Check the care team in [facility] and look for a) attending/consulting [facility] provider listed and b) the [facility] team listed Log into [email or web address] and use [place]'s universal password to access. If you do not have the password, please contact the hospital operator. Locate the [facility] provider you are looking for under [facility] and page to a number that you can be directly reached. If you still have difficulty reaching the provider, please page the [facility] (Director on Call) for the Hospitalists listed on amion for  assistance.  01/20/2024, 7:53 PM           [1]  Allergies Allergen Reactions   Amlodipine Besylate Other (See Comments)    fatigue   Aspirin  Other (See Comments)     GI upset   Other Itching, Other (See Comments), Rash and Dermatitis    VICRYL Sutures   Tape  Itching, Other (See Comments) and Rash    Occlusive tape and bandaids   Wound Dressing Adhesive Rash   "

## 2024-01-20 NOTE — ED Notes (Signed)
 ED Provider at bedside.

## 2024-01-20 NOTE — ED Notes (Signed)
 Repeat labs recollected and sent to main lab.

## 2024-01-20 NOTE — ED Provider Notes (Addendum)
 Patient signed out to me by previous provider. Please refer to their note for full HPI.  Briefly this is a 75 year old male who presents emergency department concern for left thigh cellulitis, hypotension and shortness of breath.  Found to have findings of left thigh cellulitis without focal abscess on bedside ultrasound by previous provider.  Tachycardic, tachypneic requiring 2 L nasal cannula but protecting airway.  Chest x-ray concerning for worsening opacities in the left lung, possible pneumonia.  Blood work shows leukopenia with an elevated lactic of 2.9, chemistry hemolyzed and we are awaiting repeat.   Patient signed out pending CT scanning, blood work and admission.   ED Course: Chemistry shows mild AKI, second lactic acid is slightly uptrending at 3.2.  Patient is receiving septic fluid protocol along with antibiotics.  CT chest and thigh ordered for further evaluation.  On reevaluation patient continues to be tachypneic but conversational.  Will plan for admission for sepsis secondary to cellulitis and pneumonia.  Patients evaluation and results requires admission for further treatment and care.  Spoke with hospitalist, reviewed patient's ED course and they accept admission.  Patient agrees with admission plan, offers no new complaints and is stable/unchanged at time of admit.  .Critical Care  Performed by: Bari Roxie HERO, DO Authorized by: Bari Roxie HERO, DO   Critical care provider statement:    Critical care time (minutes):  30   Critical care time was exclusive of:  Separately billable procedures and treating other patients   Critical care was necessary to treat or prevent imminent or life-threatening deterioration of the following conditions:  Sepsis   Critical care was time spent personally by me on the following activities:  Development of treatment plan with patient or surrogate, discussions with consultants, evaluation of patient's response to treatment, examination of  patient, ordering and review of laboratory studies, ordering and review of radiographic studies, ordering and performing treatments and interventions, pulse oximetry, re-evaluation of patient's condition and review of old charts   I assumed direction of critical care for this patient from another provider in my specialty: yes     Care discussed with: admitting provider        Bari Roxie HERO, DO 01/20/24 1902

## 2024-01-20 NOTE — ED Provider Notes (Signed)
 "  Emergency Department Provider Note   I have reviewed the triage vital signs and the nursing notes.   HISTORY  Chief Complaint Altered Mental Status   HPI Cody Tate is a 74 y.o. male with past history reviewed presents to the emergency department with weakness, left thigh cellulitis, hypotension, and shortness of breath.  Patient has had a area of redness and swelling to the back of the left thigh for the past week.  The area has been getting bigger.  Yesterday he began having increased weakness along with some shortness of breath.  Today has been more confused.  EMS was called who recorded an initial systolic pressure in the 70s with 80% room air saturation.  He was given 1 L IV fluid bolus prior to arrival.  Patient denies any chest or abdominal pain.  Noted to have bilateral lower extremity swelling which he reports is baseline for him.  EMS recorded initial temp of 100.4 F.    Past Medical History:  Diagnosis Date   Acute heart failure with preserved ejection fraction (HCC) 11/12/2022   Arthritis    Back pain    Radiates down both legs   BPH (benign prostatic hyperplasia)    Cancer (HCC) 2022   prostate cancer   COPD (chronic obstructive pulmonary disease) (HCC)    Dyspnea    Enlarged prostate    GERD (gastroesophageal reflux disease)    Glaucoma    H/O hiatal hernia    H/O wheezing    occ inhaler usage   Heart murmur    Hypertension    Lumbar foraminal stenosis    Macular degeneration    Meningioma (HCC) 2014   Treated with radiation   Neuromuscular disorder (HCC)    Neuropathy bilateral legs   PONV (postoperative nausea and vomiting)    Pulmonary nodules    resolved on 2019 follow up CT   S/P insertion of spinal cord stimulator     Review of Systems  Constitutional: Positive fever/chills Cardiovascular: Denies chest pain. Respiratory: Positive shortness of breath.  Gastrointestinal: No abdominal pain.  No nausea, no vomiting.  No diarrhea.    Musculoskeletal: Positive left thigh pain.  Skin: Rash to the posterior right leg.  Neurological: Negative for headaches.  ____________________________________________   PHYSICAL EXAM:  VITAL SIGNS: ED Triage Vitals  Encounter Vitals Group     BP 01/20/24 1415 (!) 100/56     Pulse Rate 01/20/24 1415 (!) 117     Resp 01/20/24 1415 (!) 22     Temp 01/20/24 1415 98.3 F (36.8 C)     Temp Source 01/20/24 1415 Oral     SpO2 01/20/24 1415 91 %     Weight 01/20/24 1411 222 lb (100.7 kg)     Height 01/20/24 1411 5' 6.5 (1.689 m)   Constitutional: Slightly drowsy but able to provide a history. Appears unwell.  Eyes: Conjunctivae are normal. Head: Atraumatic. Nose: No congestion/rhinnorhea. Mouth/Throat: Mucous membranes are moist.   Neck: No stridor.  Cardiovascular: Tachycardia. Good peripheral circulation. Grossly normal heart sounds.   Respiratory: Increased respiratory effort.  No retractions. Lungs CTAB. Gastrointestinal: Soft and nontender. No distention.  Musculoskeletal: 10 x 10 cm focal area of induration to the posterior left thigh.  No fluctuance.  Surrounding erythema notable.  Bilateral pitting edema to both feet into the lower extremities.  Compartments are soft.  No joint effusions. Neurologic:  Normal speech and language. No gross focal neurologic deficits are appreciated.  Skin:  Skin is  warm and dry.  10 x 10 cm area of induration noted.  Bedside ultrasound shows cobblestoning but no obvious fluid collection.    ____________________________________________   LABS (all labs ordered are listed, but only abnormal results are displayed)  Labs Reviewed  RESP PANEL BY RT-PCR (RSV, FLU A&B, COVID)  RVPGX2  CULTURE, BLOOD (ROUTINE X 2)  CULTURE, BLOOD (ROUTINE X 2)  COMPREHENSIVE METABOLIC PANEL WITH GFR  CBC WITH DIFFERENTIAL/PLATELET  PROTIME-INR  URINALYSIS, W/ REFLEX TO CULTURE (INFECTION SUSPECTED)  I-STAT CG4 LACTIC ACID, ED    ____________________________________________  EKG   EKG Interpretation Date/Time:  Monday January 20 2024 14:15:05 EST Ventricular Rate:  117 PR Interval:  170 QRS Duration:  142 QT Interval:  315 QTC Calculation: 440 R Axis:   72  Text Interpretation: Sinus tachycardia Right bundle branch block Anteroseptal infarct, age indeterminate Similar to prior Confirmed by Darra Chew [phone]) on 01/20/2024 2:18:44 PM        ____________________________________________  RADIOLOGY  No results found.  ____________________________________________   PROCEDURES  Procedure(s) performed:   Procedures  CRITICAL CARE Performed by: Chew KANDICE Darra Total critical care time: 35 minutes Critical care time was exclusive of separately billable procedures and treating other patients. Critical care was necessary to treat or prevent imminent or life-threatening deterioration. Critical care was time spent personally by me on the following activities: development of treatment plan with patient and/or surrogate as well as nursing, discussions with consultants, evaluation of patient's response to treatment, examination of patient, obtaining history from patient or surrogate, ordering and performing treatments and interventions, ordering and review of laboratory studies, ordering and review of radiographic studies, pulse oximetry and re-evaluation of patient's condition.  Chew Darra, MD Emergency Medicine  ____________________________________________   INITIAL IMPRESSION / ASSESSMENT AND PLAN / ED COURSE  Pertinent labs & imaging results that were available during my care of the patient were reviewed by me and considered in my medical decision making (see chart for details).   This patient is Presenting for Evaluation of AMS, which does require a range of treatment options, and is a complaint that involves a high risk of morbidity and mortality.  The Differential Diagnoses includes but is not  exclusive to alcohol , illicit or prescription medications, intracranial pathology such as stroke, intracerebral hemorrhage, fever or infectious causes including sepsis, hypoxemia, uremia, trauma, endocrine related disorders such as diabetes, hypoglycemia, thyroid -related diseases, etc.   Critical Interventions-    Medications  lactated ringers  infusion (has no administration in time range)  sodium chloride  0.9 % bolus 500 mL (has no administration in time range)  cefTRIAXone  (ROCEPHIN ) 2 g in sodium chloride  0.9 % 100 mL IVPB (has no administration in time range)  vancomycin  (VANCOREADY) IVPB 2000 mg/400 mL (has no administration in time range)    Reassessment after intervention: No hypotension.    I did obtain Additional Historical Information from EMS.   Clinical Laboratory Tests Ordered, included ***  Radiologic Tests Ordered, included ***. I independently interpreted the images and agree with radiology interpretation.   Cardiac Monitor Tracing which shows sinus tachycardia.    Social Determinants of Health Risk patient is not an active smoker.   Consult complete with  Medical Decision Making: Summary:  Patient presents emergency department with fever, tachycardia, hypotension with EMS.  Not hypotensive here after 1 L IV fluid.  Afebrile.  Does have new O2 requirement now on 4 L.  He has significant swelling in both legs but infection source likely a focal area of  induration/cellulitis to the posterior left thigh.  I did a quick bedside ultrasound and did not appreciate a large pocket of fluid.  I do not appreciate crepitus to strongly suspect fasciitis.  I will obtain a CT of the left femur with contrast and antibiotics.  Reevaluation with update and discussion with   ***Considered admission***  Patient's presentation is most consistent with {EM COPA:27473}   Disposition:   ____________________________________________  FINAL CLINICAL IMPRESSION(S) / ED DIAGNOSES  Final  diagnoses:  None     NEW OUTPATIENT MEDICATIONS STARTED DURING THIS VISIT:  New Prescriptions   No medications on file    Note:  This document was prepared using Dragon voice recognition software and may include unintentional dictation errors.  Fonda Law, MD, [facility] Emergency [facility]  "

## 2024-01-20 NOTE — Sepsis Progress Note (Signed)
 Elink monitoring for the code sepsis protocol.

## 2024-01-20 NOTE — Progress Notes (Signed)
 Pharmacy Antibiotic Note  Cody Tate is a 74 y.o. male for which pharmacy has been consulted for cefepime  and vancomycin  dosing for sepsis.  SCr 1.65 - AKI WBC 1.9; LA 2.9>3.2; T 97.8; HR 109; RR 22 COVID neg / flu neg  Plan: Cefepime  2g q12hr  Vancomycin  2000 mg once, subsequent dosing as indicated per random vancomycin  level until renal function stable and/or improved, at which time scheduled dosing can be considered Monitor WBC, fever, renal function, cultures De-escalate when able  Height: 5' 6.5 (168.9 cm) Weight: 100.7 kg (222 lb) IBW/kg (Calculated) : 64.95  Temp (24hrs), Avg:98.1 F (36.7 C), Min:97.8 F (36.6 C), Max:98.3 F (36.8 C)  Recent Labs  Lab 01/20/24 1441 01/20/24 1447 01/20/24 1744 01/20/24 1752  WBC 1.9*  --   --   --   CREATININE  --   --  1.65*  --   LATICACIDVEN  --  2.9*  --  3.2*    Estimated Creatinine Clearance: 44.1 mL/min (A) (by C-G formula based on SCr of 1.65 mg/dL (H)).    Allergies[1]  Microbiology results: Pending  Thank you for allowing pharmacy to be a part of this patients care.  Dorn Buttner, PharmD, BCPS 01/20/2024 7:20 PM ED Clinical Pharmacist -  [phone]       [1]  Allergies Allergen Reactions   Amlodipine Besylate Other (See Comments)    fatigue   Aspirin  Other (See Comments)     GI upset   Other Itching, Other (See Comments), Rash and Dermatitis    VICRYL Sutures   Tape Itching, Other (See Comments) and Rash    Occlusive tape and bandaids   Wound Dressing Adhesive Rash

## 2024-01-20 NOTE — ED Notes (Signed)
 Patient transported to CT

## 2024-01-20 NOTE — ED Notes (Signed)
Got patient into a gown on the monitor did EKG shown to Dr Laverta Baltimore patient is resting with call bell in reach

## 2024-01-21 DIAGNOSIS — R6520 Severe sepsis without septic shock: Secondary | ICD-10-CM | POA: Diagnosis not present

## 2024-01-21 DIAGNOSIS — J9601 Acute respiratory failure with hypoxia: Secondary | ICD-10-CM | POA: Diagnosis not present

## 2024-01-21 DIAGNOSIS — A419 Sepsis, unspecified organism: Secondary | ICD-10-CM | POA: Diagnosis not present

## 2024-01-21 LAB — AMMONIA: Ammonia: 30 umol/L (ref 9–35)

## 2024-01-21 LAB — CBC
HCT: 43 % (ref 39.0–52.0)
Hemoglobin: 14.3 g/dL (ref 13.0–17.0)
MCH: 31.4 pg (ref 26.0–34.0)
MCHC: 33.3 g/dL (ref 30.0–36.0)
MCV: 94.3 fL (ref 80.0–100.0)
Platelets: 248 K/uL (ref 150–400)
RBC: 4.56 MIL/uL (ref 4.22–5.81)
RDW: 13.3 % (ref 11.5–15.5)
WBC: 11.3 K/uL — ABNORMAL HIGH (ref 4.0–10.5)
nRBC: 0 % (ref 0.0–0.2)

## 2024-01-21 LAB — PROCALCITONIN: Procalcitonin: 13.7 ng/mL

## 2024-01-21 LAB — LACTIC ACID, PLASMA
Lactic Acid, Venous: 3.1 mmol/L (ref 0.5–1.9)
Lactic Acid, Venous: 2.9 mmol/L (ref 0.5–1.9)
Lactic Acid, Venous: 2.7 mmol/L (ref 0.5–1.9)
Lactic Acid, Venous: 4 mmol/L (ref 0.5–1.9)

## 2024-01-21 LAB — URINALYSIS, W/ REFLEX TO CULTURE (INFECTION SUSPECTED)
Bacteria, UA: NONE SEEN
Bilirubin Urine: NEGATIVE
Glucose, UA: 150 mg/dL — AB
Hgb urine dipstick: NEGATIVE
Ketones, ur: NEGATIVE mg/dL
Leukocytes,Ua: NEGATIVE
Nitrite: NEGATIVE
Protein, ur: 30 mg/dL — AB
Specific Gravity, Urine: 1.039 — ABNORMAL HIGH (ref 1.005–1.030)
pH: 5 (ref 5.0–8.0)

## 2024-01-21 LAB — COMPREHENSIVE METABOLIC PANEL WITH GFR
ALT: 20 U/L (ref 0–44)
AST: 49 U/L — ABNORMAL HIGH (ref 15–41)
Albumin: 3 g/dL — ABNORMAL LOW (ref 3.5–5.0)
Alkaline Phosphatase: 112 U/L (ref 38–126)
Anion gap: 13 (ref 5–15)
BUN: 24 mg/dL — ABNORMAL HIGH (ref 8–23)
CO2: 22 mmol/L (ref 22–32)
Calcium: 8.6 mg/dL — ABNORMAL LOW (ref 8.9–10.3)
Chloride: 100 mmol/L (ref 98–111)
Creatinine, Ser: 1.03 mg/dL (ref 0.61–1.24)
GFR, Estimated: >60 mL/min
Glucose, Bld: 72 mg/dL (ref 70–99)
Potassium: 4.6 mmol/L (ref 3.5–5.1)
Sodium: 135 mmol/L (ref 135–145)
Total Bilirubin: 0.7 mg/dL (ref 0.0–1.2)
Total Protein: 5.5 g/dL — ABNORMAL LOW (ref 6.5–8.1)

## 2024-01-21 LAB — CREATININE, URINE, RANDOM: Creatinine, Urine: 113 mg/dL

## 2024-01-21 LAB — SODIUM, URINE, RANDOM: Sodium, Ur: 66 mmol/L

## 2024-01-21 LAB — STREP PNEUMONIAE URINARY ANTIGEN: Strep Pneumo Urinary Antigen: NEGATIVE

## 2024-01-21 MED ORDER — VANCOMYCIN HCL 1250 MG/250ML IV SOLN
1250.0000 mg | INTRAVENOUS | Status: DC
  Administered 2024-01-21: 1250 mg via INTRAVENOUS
  Filled 2024-01-21 (×2): qty 250

## 2024-01-21 MED ORDER — VITAMIN D3 25 MCG (1000 UNIT) PO TABS
4000.0000 [IU] | ORAL_TABLET | Freq: Every day | ORAL | Status: DC
  Administered 2024-01-21 – 2024-01-22 (×2): 4000 [IU] via ORAL
  Filled 2024-01-21 (×4): qty 4

## 2024-01-21 MED ORDER — DICLOFENAC SODIUM 1 % EX GEL
2.0000 g | Freq: Once | CUTANEOUS | Status: AC
  Administered 2024-01-21: 2 g via TOPICAL
  Filled 2024-01-21: qty 100

## 2024-01-21 MED ORDER — FLUTICASONE PROPIONATE 50 MCG/ACT NA SUSP
1.0000 | Freq: Two times a day (BID) | NASAL | Status: DC
  Administered 2024-01-21 – 2024-01-27 (×13): 1 via NASAL
  Filled 2024-01-21: qty 16

## 2024-01-21 MED ORDER — VITAMIN C 500 MG PO TABS
1000.0000 mg | ORAL_TABLET | Freq: Every day | ORAL | Status: DC
  Administered 2024-01-21 – 2024-01-27 (×7): 1000 mg via ORAL
  Filled 2024-01-21 (×7): qty 2

## 2024-01-21 MED ORDER — BUDESONIDE 0.25 MG/2ML IN SUSP
0.2500 mg | Freq: Once | RESPIRATORY_TRACT | Status: AC
  Administered 2024-01-21: 0.25 mg via RESPIRATORY_TRACT
  Filled 2024-01-21: qty 2

## 2024-01-21 MED ORDER — LACTATED RINGERS IV SOLN: INTRAVENOUS | Status: AC

## 2024-01-21 MED ORDER — OXYMETAZOLINE HCL 0.05 % NA SOLN: 1.0000 | Freq: Two times a day (BID) | NASAL | Status: AC | PRN

## 2024-01-21 MED ORDER — BUDESON-GLYCOPYRROL-FORMOTEROL 160-9-4.8 MCG/ACT IN AERO
2.0000 | INHALATION_SPRAY | Freq: Two times a day (BID) | RESPIRATORY_TRACT | Status: DC
  Filled 2024-01-21: qty 5.9

## 2024-01-21 MED ORDER — SODIUM CHLORIDE 0.9 % IV SOLN
2.0000 g | Freq: Three times a day (TID) | INTRAVENOUS | Status: DC
  Administered 2024-01-21 – 2024-01-23 (×6): 2 g via INTRAVENOUS
  Filled 2024-01-21 (×6): qty 12.5

## 2024-01-21 MED ORDER — HYDROXYZINE HCL 25 MG PO TABS
25.0000 mg | ORAL_TABLET | Freq: Once | ORAL | Status: AC
  Administered 2024-01-21: 25 mg via ORAL
  Filled 2024-01-21: qty 1

## 2024-01-21 MED ORDER — TIMOLOL MALEATE 0.5 % OP SOLN
1.0000 [drp] | Freq: Every day | OPHTHALMIC | Status: DC
  Administered 2024-01-21 – 2024-01-26 (×6): 1 [drp] via OPHTHALMIC
  Filled 2024-01-21: qty 5

## 2024-01-21 MED ORDER — ENOXAPARIN SODIUM 60 MG/0.6ML IJ SOSY
50.0000 mg | PREFILLED_SYRINGE | INTRAMUSCULAR | Status: DC
  Administered 2024-01-21 – 2024-01-26 (×6): 50 mg via SUBCUTANEOUS
  Filled 2024-01-21 (×6): qty 0.6

## 2024-01-21 MED ORDER — ARFORMOTEROL TARTRATE 15 MCG/2ML IN NEBU
15.0000 ug | INHALATION_SOLUTION | Freq: Once | RESPIRATORY_TRACT | Status: AC
  Administered 2024-01-21: 15 ug via RESPIRATORY_TRACT
  Filled 2024-01-21: qty 2

## 2024-01-21 MED ORDER — REVEFENACIN 175 MCG/3ML IN SOLN
175.0000 ug | Freq: Once | RESPIRATORY_TRACT | Status: AC
  Administered 2024-01-21: 175 ug via RESPIRATORY_TRACT
  Filled 2024-01-21: qty 3

## 2024-01-21 MED ORDER — OXYCODONE HCL 5 MG PO TABS
10.0000 mg | ORAL_TABLET | ORAL | Status: DC
  Administered 2024-01-21 – 2024-01-22 (×7): 10 mg via ORAL
  Filled 2024-01-21 (×7): qty 2

## 2024-01-21 NOTE — Progress Notes (Signed)
 " PROGRESS NOTE  SHOICHI MIELKE FMW:[id number] DOB: [birth year] DOA: 01/20/2024 PCP: Charlott Dorn LABOR, MD   LOS: 1 day   Brief narrative:  Cody Tate is a 74 y.o. male with medical history significant of COPD, essential hypertension, chronic lower extremity edema, diastolic heart failure, CAD, hepatic cirrhosis peripheral neuropathy, GERD, chronic pain syndrome, hyperlipidemia and chronic constipation presented to emergency department with multiple complaint include left leg boil for 1 week, altered mental status, shortness of breath, cough and lethargy.  EMS reported that patient blood pressure is soft in low 70s and shortness of breath O2 sat 80% room air.  In the ED, patient found alert oriented x 4 however lethargic.  He was persistently tachycardic and tachypnea with borderline hypotension.  O2 sat 91 to 90% on 2 L and EMS reported O2 sat 80% room air initially.  Lactic acid was elevated at 2.9 and 3.2..  Per chart review patient has chronically elevated lactic acid upper 2-3 range.  CMP showed creatinine at 1.6.  WBC at 1.9 and ANC at 1.5.SABRA  Chest x-ray showed nodular consolidation and ground glass opacities.  In the ED patient received sepsis protocol with Rocephin  and vancomycin  and was considered for admission to the hospital for further evaluation and treatment.    Assessment/Plan: Principal Problem:   Sepsis (HCC) Active Problems:   CAP (community acquired pneumonia)   Cellulitis of left lower extremity   Essential hypertension   Acute hypoxic respiratory failure (HCC)   Acute kidney injury superimposed on chronic kidney disease   S/P insertion of spinal cord stimulator   COPD (chronic obstructive pulmonary disease) (HCC)   Hyperlipidemia   Chronic diastolic CHF (congestive heart failure) (HCC)   Leukopenia   Neutropenia  Sepsis secondary to pneumonia and thigh cellulitis Cellulitis of the left thigh  CT scan of the left femur showed stranding in the subcutaneous of  tissue no fluid collection.  Chest x-ray with some nodular densities and consolidation.  Continue broad-spectrum antibiotics supportive care.    Acute hypoxic respiratory failure secondary to pneumonia Community-acquired pneumonia The patient was on 2 L of high flow nasal cannula.  Has been weaned down to 8 L.  CT angiogram of the chest without pulmonary embolism but consolidation throughout the left lung and patchy ground glass opacities in the right upper lobe..  Continue incentive spirometry flutter valve and supportive care.  Currently on vancomycin  and cefepime .  Will continue for now.    Metabolic encephalopathy. Was somnolent on presentation. Continue aspiration precautions.  He is alert awake and Communicative at this time    Essential hypertension Chronic diastolic heart failure patient is not taking neither Lopressor , Toprol -XL, Lasix .  Will continue to hold.   History of COPD Acute hypoxic respiratory failure in the setting of pneumonia Continue DuoNeb as needed and resume inhalers from home.  Continue supportive care   Leukopenia and neutropenia Had elevated lactic.  Streptococcus pneumonia urinary antigen negative. WBC count 1.9 and absolute neutrophil count 1.5.  Currently on broad-spectrum antibiotics including IV vancomycin  and cefepime .  Blood cultures negative in less than 24 hours follow., sputum culture     AKI CKD stage II Creatinine on presentation was 1.6 likely prerenal.  Continue to monitor.    Status post spinal cord stimulator -Holding pain medications in the setting of lethargy and drowsiness.  Resume pain medication from home today due to increased alertness.  Hold gabapentin .  Resume oxycodone  from home.   Chronic bilateral lower extremity swelling Secondary to  cirrhosis.  Noncompliant to Lasix  and spironolactone  as per family will need to resume soon.  Hyperlipidemia Continue Lipitor   GERD Continue Protonix   DVT prophylaxis: enoxaparin  (LOVENOX )  injection 40 mg Start: 01/20/24 2000 SCDs Start: 01/20/24 1927 Place TED hose Start: 01/20/24 1927   Disposition: Uncertain at this time, will get PT OT evaluation.  Status is: Inpatient Remains inpatient appropriate because: Pending clinical improvement.    Code Status:     Code Status: Full Code  Family Communication: Spoke with the patient's spouse at bedside  Consultants: None  Procedures: None  Anti-infectives:  Vancomycin  and cefepime   Anti-infectives (From admission, onward)    Start     Dose/Rate Route Frequency Ordered Stop   01/20/24 2200  ceFEPIme  (MAXIPIME ) 2 g in sodium chloride  0.9 % 100 mL IVPB        2 g 200 mL/hr over 30 Minutes Intravenous Every 12 hours 01/20/24 2004     01/20/24 2003  vancomycin  variable dose per unstable renal function (pharmacist dosing)         Does not apply See admin instructions 01/20/24 2004     01/20/24 1445  vancomycin  (VANCOREADY) IVPB 2000 mg/400 mL        2,000 mg 200 mL/hr over 120 Minutes Intravenous  Once 01/20/24 1430 01/20/24 1828   01/20/24 1430  cefTRIAXone  (ROCEPHIN ) 2 g in sodium chloride  0.9 % 100 mL IVPB        2 g 200 mL/hr over 30 Minutes Intravenous Once 01/20/24 1427 01/20/24 1539   01/20/24 1430  vancomycin  (VANCOCIN ) IVPB 1000 mg/200 mL premix  Status:  Discontinued        1,000 mg 200 mL/hr over 60 Minutes Intravenous  Once 01/20/24 1427 01/20/24 1430      Subjective: Today, patient was seen and examined at bedside.  Patient states that he feels a little better than yesterday but still has shortness of breath, dyspnea cough with sputum production.  Denies any pain nausea vomiting.  Patient's spouse at bedside.  Objective: Vitals:   01/21/24 0845 01/21/24 0900  BP:  (!) 136/109  Pulse: 100   Resp: (!) 23   Temp:    SpO2: 100%     Intake/Output Summary (Last 24 hours) at 01/21/2024 1014 Last data filed at 01/21/2024 0955 Gross per 24 hour  Intake 100 ml  Output 230 ml  Net -130 ml   Filed  Weights   01/20/24 1411  Weight: 100.7 kg   Body mass index is 35.29 kg/m.   Physical Exam:  GENERAL: Patient is alert awake and oriented. Not in obvious distress.  Obese build.  On nasal cannula oxygen at 8 L/min. HENT: No scleral pallor or icterus. Pupils equally reactive to light. Oral mucosa is moist NECK: is supple, no gross swelling noted. CHEST: Diminished breath sounds bilaterally.  Coarse breath sounds noted.. CVS: S1 and S2 heard, no murmur. Regular rate and rhythm.  ABDOMEN: Soft, non-tender, bowel sounds are present. EXTREMITIES: Left posterior thigh with dressing.  Bilateral lower extremity pitting edema noted. CNS: Cranial nerves are intact. No focal motor deficits. SKIN: warm and dry   Data Review: I have personally reviewed the following laboratory data and studies,  CBC: Recent Labs  Lab 01/20/24 1441 01/20/24 1954 01/21/24 0537  WBC 1.9*  --  11.3*  NEUTROABS 1.5*  --   --   HGB 15.9 12.2* 14.3  HCT 48.0 36.0* 43.0  MCV 93.4  --  94.3  PLT 297  --  248   Basic Metabolic Panel: Recent Labs  Lab 01/20/24 1744 01/20/24 1954 01/21/24 0537  NA 137 137 135  K 3.8 3.6 4.6  CL 102  --  100  CO2 24  --  22  GLUCOSE 78  --  72  BUN 27*  --  24*  CREATININE 1.65*  --  1.03  CALCIUM  8.2*  --  8.6*   Liver Function Tests: Recent Labs  Lab 01/20/24 1744 01/21/24 0537  AST 36 49*  ALT 16 20  ALKPHOS 99 112  BILITOT 0.7 0.7  PROT 4.6* 5.5*  ALBUMIN  2.6* 3.0*   No results for input(s): LIPASE, AMYLASE in the last 168 hours. Recent Labs  Lab 01/21/24 0537  AMMONIA 30   Cardiac Enzymes: No results for input(s): CKTOTAL, CKMB, CKMBINDEX, TROPONINI in the last 168 hours. BNP (last 3 results) Recent Labs    05/22/23 1005 06/28/23 0449  BNP 165.8* 110.7*    ProBNP (last 3 results) Recent Labs    03/21/23 1307 06/25/23 1724  PROBNP 335 633.0*    CBG: No results for input(s): GLUCAP in the last 168 hours. Recent Results  (from the past 240 hours)  Blood Culture (routine x 2)     Status: None (Preliminary result)   Collection Time: 01/20/24  2:37 PM   Specimen: BLOOD RIGHT HAND  Result Value Ref Range Status   Specimen Description BLOOD RIGHT HAND  Final   Special Requests   Final    BOTTLES DRAWN AEROBIC AND ANAEROBIC Blood Culture adequate volume   Culture   Final    NO GROWTH < 24 HOURS Performed at [facility], [address] [place], [place] [address]    Report Status PENDING  Incomplete  Blood Culture (routine x 2)     Status: None (Preliminary result)   Collection Time: 01/20/24  2:58 PM   Specimen: BLOOD LEFT ARM  Result Value Ref Range Status   Specimen Description BLOOD LEFT ARM  Final   Special Requests   Final    BOTTLES DRAWN AEROBIC ONLY Blood Culture results may not be optimal due to an inadequate volume of blood received in culture bottles   Culture   Final    NO GROWTH < 24 HOURS Performed at [facility], [address], [place], [place] [address]    Report Status PENDING  Incomplete  Resp panel by RT-PCR (RSV, Flu A&B, Covid) Anterior Nasal Swab     Status: None   Collection Time: 01/20/24  5:44 PM   Specimen: Anterior Nasal Swab  Result Value Ref Range Status   SARS Coronavirus 2 by RT PCR NEGATIVE NEGATIVE Final   Influenza A by PCR NEGATIVE NEGATIVE Final   Influenza B by PCR NEGATIVE NEGATIVE Final    Comment: (NOTE) The Xpert Xpress SARS-CoV-2/FLU/RSV plus assay is intended as an aid in the diagnosis of influenza from Nasopharyngeal swab specimens and should not be used as a sole basis for treatment. Nasal washings and aspirates are unacceptable for Xpert Xpress SARS-CoV-2/FLU/RSV testing.  Fact Sheet for Patients: bloggercourse.com  Fact Sheet for Healthcare Providers: seriousbroker.it  This test is not yet approved or cleared by the [place]  FDA and has been authorized for detection and/or  diagnosis of SARS-CoV-2 by FDA under an Emergency Use Authorization (EUA). This EUA will remain in effect (meaning this test can be used) for the duration of the COVID-19 declaration under Section 564(b)(1) of the Act, 21 U.S.C. section 360bbb-3(b)(1), unless  the authorization is terminated or revoked.     Resp Syncytial Virus by PCR NEGATIVE NEGATIVE Final    Comment: (NOTE) Fact Sheet for Patients: bloggercourse.com  Fact Sheet for Healthcare Providers: seriousbroker.it  This test is not yet approved or cleared by the [place]  FDA and has been authorized for detection and/or diagnosis of SARS-CoV-2 by FDA under an Emergency Use Authorization (EUA). This EUA will remain in effect (meaning this test can be used) for the duration of the COVID-19 declaration under Section 564(b)(1) of the Act, 21 U.S.C. section 360bbb-3(b)(1), unless the authorization is terminated or revoked.  Performed at [facility], [address] [place], [place] [address]      Studies: CT FEMUR LEFT W CONTRAST Result Date: 01/20/2024 EXAM: CT LEFT THIGH, WITH IV CONTRAST 01/20/2024 07:24:00 PM TECHNIQUE: Axial images were acquired through the left thigh with IV contrast. Reformatted images were reviewed. Automated exposure control, iterative reconstruction, and/or weight based adjustment of the mA/kV was utilized to reduce the radiation dose to as low as reasonably achievable. 75 mL (iohexol  (OMNIPAQUE ) 350 MG/ML injection 75 mL IOHEXOL  350 MG/ML SOLN) was administered intravenously. COMPARISON: None available. CLINICAL HISTORY: Soft tissue mass, thigh, deep FINDINGS: BONES AND JOINTS: No acute fracture or focal osseous lesion. No dislocation. The joint spaces are normal. SOFT TISSUES: No evidence of left lower extremity DVT in the visualized venous structures. Arterial atherosclerosis in the left iliofemoral vessels. Stranding within the subcutaneous  soft tissues medially in the left upper to mid thigh. In the appropriate clinical setting, this could reflect cellulitis. No focal fluid collection. IMPRESSION: 1. Stranding within the subcutaneous soft tissues medially in the left upper to mid thigh, which could reflect cellulitis. No focal fluid collection. Electronically signed by: Franky Crease MD 01/20/2024 07:43 PM EST RP Workstation: [id number]   CT Angio Chest PE W/Cm &/Or Wo Cm Result Date: 01/20/2024 EXAM: CTA CHEST 01/20/2024 07:24:00 PM TECHNIQUE: CTA of the chest was performed without and with the administration of 75 mL of iohexol  (OMNIPAQUE ) 350 MG/ML injection. Multiplanar reformatted images are provided for review. MIP images are provided for review. Automated exposure control, iterative reconstruction, and/or weight based adjustment of the mA/kV was utilized to reduce the radiation dose to as low as reasonably achievable. COMPARISON: None available. CLINICAL HISTORY: Pulmonary embolism (PE) suspected, high prob. FINDINGS: PULMONARY ARTERIES: Pulmonary arteries are adequately opacified for evaluation. No acute pulmonary embolus. Main pulmonary artery is normal in caliber. MEDIASTINUM: The heart and pericardium demonstrate no acute abnormality. Coronary artery and aortic atherosclerosis. There is no acute abnormality of the thoracic aorta. LYMPH NODES: No mediastinal, hilar or axillary lymphadenopathy. LUNGS AND PLEURA: Consolidation throughout the left lung compatible with pneumonia. Patchy ground glass opacities also noted in the right upper lobe, also likely pneumonia. No pleural effusions. No pneumothorax. UPPER ABDOMEN: Limited images of the upper abdomen are unremarkable. SOFT TISSUES AND BONES: No acute bone or soft tissue abnormality. IMPRESSION: 1. No pulmonary embolism. 2. Consolidation throughout the left lung and patchy ground glass opacities in the right upper lobe, compatible with pneumonia. 3. Coronary artery disease, aortic  atherosclerosis. Electronically signed by: Franky Crease MD 01/20/2024 07:40 PM EST RP Workstation: [id number]   DG Chest Port 1 View if patient is in a treatment room. Result Date: 01/20/2024 CLINICAL DATA:  Suspected sepsis EXAM: PORTABLE CHEST 1 VIEW COMPARISON:  01/15/2024, chest CT 11/06/2023 FINDINGS: Hardware in the cervical spine. Borderline cardiomegaly with aortic atherosclerosis. Widespread heterogeneous and slightly nodular consolidation and ground-glass  disease throughout the left thorax, worse compared with 01/15/2024. Possible trace left effusion. No pneumothorax. Possible calcified loose bodies about the left shoulder. IMPRESSION: Widespread heterogeneous and slightly nodular consolidation and ground-glass disease throughout the left thorax, worse compared with 01/15/2024. Findings could be due to pneumonia, imaging follow-up to resolution is advised Electronically Signed   By: Luke Bun M.D.   On: 01/20/2024 15:44      Vernal Alstrom, MD  [facility] 01/21/2024  If 7PM-7AM, please contact night-coverage             "

## 2024-01-21 NOTE — Hospital Course (Addendum)
 Patient with PMH of COPD, HTN, chronic HFpEF, CAD, liver cirrhosis, neuropathy, GERD, chronic pain, HLD, chronic edema present to the hospital with complaints of shortness of breath cough lethargy as well as boil on the left leg for 1 week with confusion. Was found to have severe sepsis due to cellulitis.  X-ray also shows possibility of pneumonia.  Viral test were negative.   Significant events: 1/19 admitted to the hospital.  CT PE protocol negative for PE. 1/21 lower extremity Doppler negative for DVT.  ABG shows significant hypoxia. 1/22 echocardiogram shows EF more than 75%, no significant valvular abnormality. CT head unremarkable for any acute abnormality.  CT abdomen and pelvis unremarkable for acute abnormality.  Shows some constipation.  Consults: Palliative care    Assessment and Plan: Severe sepsis due to combination of pneumonia as well as cellulitis. Primary reason for presentation is boil on his left inner thigh. CT scan shows evidence of significant subcutaneous tissue without any gas formation. No evidence of bony involvement. Blood cultures so far negative. Started on IV vancomycin  and cefepime .  Switching to doxycycline  and Unasyn . Blood cultures so far negative. Prior history of Pseudomonas, although from chart review appears that this was considered not an infectious cause of the presentation.   Acute hypoxic respiratory failure secondary to left lobar community-acquired pneumonia. At baseline uses 2 LPM. Required 10 LPM on 1/21. VBG shows evidence of hypoxia. Treated with IV antibiotic as well as IV Lasix . BiPAP as needed.  Currently on room air. Switching to Lasix  oral twice daily.   Acute on chronic HFpEF. HTN. On Lasix  at home. Prior echocardiogram shows EF of 70-75%. Current echocardiogram unchanged. Significantly volume overloaded, responding well to IV Lasix   Switching to oral Lasix . BiPAP as needed.   Chronic pain syndrome. Chronically on pain  medication 10 mg every 4 hours of oxycodone . Patient presented with significant confusion and respiratory distress and therefore medication was switched to as needed. At present there is concern for opioid withdrawal and patient appears to be responding well to opiate medication. Will increase oxycodone  back to home regimen dose at lower frequency of every 6 hours inserted every 4 hours.   BPH. Has been on Flomax . Held for now.   Chronic constipation. Appears to have resolved. Will continue aggressive bowel regimen.   COPD. Appears to be in respiratory distress although does not appear to have any significant COPD exacerbation. Due to his respiratory distress I will switch him from Breztri  to Brovana  Pulmicort  Yupelri  combination.   Acute metabolic encephalopathy. Combination of hypoxia as well as sepsis.  Also possible opioid withdrawal. Significant improvement.  Continue current regimen of pain medications.   AKI on CKD stage II. Baseline creatinine 0.7. Creatinine on admission 1.65. Currently improving back to 0.7. Monitor while receiving IV Lasix .   History of spinal cord stimulator. Monitor for now.   Bilateral lower extremity edema. Appears to be chronic. Patient is noncompliant with Lasix  and Aldactone  at home. Lower extremity Doppler negative for DVT.  Monitor.   HLD. On Lipitor. CK level normal.   GERD. Continue PPI.   Hypophosphatemia. Replacing with IV sodium phosphate . Monitor.   Obesity class II. Body mass index is 35.29 kg/m.  Placing the patient at high risk of poor outcome.   Goals of care Discussed with wife at bedside.  Patient reportedly has a living will.  Per outpatient MOST form patient would like CPR and full aggressive measures. Palliative care was also consulted.   Poor p.o. intake. Family reports  poor p.o. intake concerns. Likely in the setting of severe critical illness. For now we will monitor.    Hypokalemia. Hypomagnesemia. Hypophosphatemia. Continue aggressive replacement. Recheck BMP.  Oral thrush. Initiating on Diflucan .

## 2024-01-21 NOTE — ED Notes (Signed)
 Date and time results received: 01/21/24 0014 (use smartphrase .now to insert current time)  Test: lactic acid Critical Value: 3.1  Name of Provider Notified: s. Sundill, md  Orders Received? Or Actions Taken?: n/a

## 2024-01-21 NOTE — Consult Note (Addendum)
" °  CLINICAL SUPPORT TEAM - WOUND OSTOMY AND CONTINENCE TEAM  CONSULTATION SERVICES   WOC Nurse-Inpatient Note  WOC Nurse Consult Note: patient with boil to L posterior thigh; CT scan showed no drainable fluid; being treated for cellulitis  Reason for Consult: L posterior thigh wound  Wound type: full thickness infectious L posterior thigh  Pressure Injury POA: NA not pressure  Measurement:approximately 10 cm x 10 cm per MD notes  Wound bed: 50% red moist to outer edges with 50% brown tan central  Drainage (amount, consistency, odor) per nursing flowsheet; not draining pus per MD notes  Periwound: erythema and induration  Dressing procedure/placement/frequency: Cleanse L posterior thigh wound with Vashe wound cleanser, do not rinse. Apply Vashe moistened gauze to wound bed 2 times daily, top with dry gauze and secure with silicone foam or ABD pad and Kerlix roll gauze whichever works best.   POC discussed with bedside nurse.  WOC team will not follow. Reconsult if further needs arise.   Thank you,    Rhonin Trott MSN, RN-BC, CWOCN    "

## 2024-01-21 NOTE — Progress Notes (Signed)
 Pharmacy Antibiotic Note  Cody Tate is a 74 y.o. male for which pharmacy has been consulted for cefepime  and vancomycin  dosing for sepsis.  SCr 1.03 WBC 1.9; LA 2.9>3.2; T 97.8; HR 109; RR 22 COVID neg / flu neg  Plan: Cefepime  2g q8hr  Start vanco 1250 mg iv q24h [eAUC 466 / Tss 10.4 / scr 1.03] De-escalate when able  Height: 5' 6.5 (168.9 cm) Weight: 100.7 kg (222 lb) IBW/kg (Calculated) : 64.95  Temp (24hrs), Avg:98.2 F (36.8 C), Min:97.8 F (36.6 C), Max:98.9 F (37.2 C)  Recent Labs  Lab 01/20/24 1441 01/20/24 1447 01/20/24 1744 01/20/24 1752 01/20/24 2328 01/21/24 0221 01/21/24 0537 01/21/24 1110  WBC 1.9*  --   --   --   --   --  11.3*  --   CREATININE  --   --  1.65*  --   --   --  1.03  --   LATICACIDVEN  --    < >  --  3.2* 3.1* 2.9* 2.7* 4.0*   < > = values in this interval not displayed.    Estimated Creatinine Clearance: 70.6 mL/min (by C-G formula based on SCr of 1.03 mg/dL).    Allergies[1]  Microbiology results: 1/19 bcx- ng<24  Thank you for allowing pharmacy to be a part of this patients care.   Mariadelosang Wynns BS, PharmD, BCPS Clinical Pharmacist 01/21/2024 12:33 PM  Contact: [phone] after 3 PM    [1]  Allergies Allergen Reactions   Amlodipine Besylate Other (See Comments)    fatigue   Aspirin  Other (See Comments)     GI upset   Other Itching, Other (See Comments), Rash and Dermatitis    VICRYL Sutures   Tape Itching, Other (See Comments) and Rash    Occlusive tape and bandaids   Wound Dressing Adhesive Rash

## 2024-01-22 ENCOUNTER — Inpatient Hospital Stay (HOSPITAL_COMMUNITY)

## 2024-01-22 ENCOUNTER — Other Ambulatory Visit: Payer: Self-pay

## 2024-01-22 DIAGNOSIS — R609 Edema, unspecified: Secondary | ICD-10-CM | POA: Diagnosis not present

## 2024-01-22 DIAGNOSIS — A419 Sepsis, unspecified organism: Secondary | ICD-10-CM | POA: Diagnosis not present

## 2024-01-22 DIAGNOSIS — R6520 Severe sepsis without septic shock: Secondary | ICD-10-CM

## 2024-01-22 DIAGNOSIS — J9601 Acute respiratory failure with hypoxia: Secondary | ICD-10-CM | POA: Diagnosis not present

## 2024-01-22 LAB — BLOOD GAS, ARTERIAL
Acid-Base Excess: 1.6 mmol/L (ref 0.0–2.0)
Acid-Base Excess: 3.1 mmol/L — ABNORMAL HIGH (ref 0.0–2.0)
Bicarbonate: 24.6 mmol/L (ref 20.0–28.0)
Bicarbonate: 25.1 mmol/L (ref 20.0–28.0)
O2 Saturation: 97.6 %
O2 Saturation: 96 %
Patient temperature: 36.9
Patient temperature: 36.2
pCO2 arterial: 33 mmHg (ref 32–48)
pCO2 arterial: 29 mmHg — ABNORMAL LOW (ref 32–48)
pH, Arterial: 7.48 — ABNORMAL HIGH (ref 7.35–7.45)
pH, Arterial: 7.54 — ABNORMAL HIGH (ref 7.35–7.45)
pO2, Arterial: 67 mmHg — ABNORMAL LOW (ref 83–108)
pO2, Arterial: 61 mmHg — ABNORMAL LOW (ref 83–108)

## 2024-01-22 LAB — CBC WITH DIFFERENTIAL/PLATELET
Abs Immature Granulocytes: 2.01 K/uL — ABNORMAL HIGH (ref 0.00–0.07)
Basophils Absolute: 0.1 K/uL (ref 0.0–0.1)
Basophils Relative: 0 %
Eosinophils Absolute: 0 K/uL (ref 0.0–0.5)
Eosinophils Relative: 0 %
HCT: 42.5 % (ref 39.0–52.0)
Hemoglobin: 14 g/dL (ref 13.0–17.0)
Immature Granulocytes: 11 %
Lymphocytes Relative: 2 %
Lymphs Abs: 0.4 K/uL — ABNORMAL LOW (ref 0.7–4.0)
MCH: 30.5 pg (ref 26.0–34.0)
MCHC: 32.9 g/dL (ref 30.0–36.0)
MCV: 92.6 fL (ref 80.0–100.0)
Monocytes Absolute: 1.2 K/uL — ABNORMAL HIGH (ref 0.1–1.0)
Monocytes Relative: 7 %
Neutro Abs: 14.4 K/uL — ABNORMAL HIGH (ref 1.7–7.7)
Neutrophils Relative %: 80 %
Platelets: 265 K/uL (ref 150–400)
RBC: 4.59 MIL/uL (ref 4.22–5.81)
RDW: 13.2 % (ref 11.5–15.5)
Smear Review: NORMAL
WBC: 18.1 K/uL — ABNORMAL HIGH (ref 4.0–10.5)
nRBC: 0 % (ref 0.0–0.2)

## 2024-01-22 LAB — BASIC METABOLIC PANEL WITH GFR
Anion gap: 8 (ref 5–15)
BUN: 23 mg/dL (ref 8–23)
CO2: 28 mmol/L (ref 22–32)
Calcium: 9.1 mg/dL (ref 8.9–10.3)
Chloride: 97 mmol/L — ABNORMAL LOW (ref 98–111)
Creatinine, Ser: 0.67 mg/dL (ref 0.61–1.24)
GFR, Estimated: >60 mL/min
Glucose, Bld: 86 mg/dL (ref 70–99)
Potassium: 4.6 mmol/L (ref 3.5–5.1)
Sodium: 133 mmol/L — ABNORMAL LOW (ref 135–145)

## 2024-01-22 LAB — CK: Total CK: 111 U/L (ref 49–397)

## 2024-01-22 LAB — C-REACTIVE PROTEIN: CRP: 30 mg/dL — ABNORMAL HIGH

## 2024-01-22 LAB — LEGIONELLA PNEUMOPHILA SEROGP 1 UR AG: L. pneumophila Serogp 1 Ur Ag: NEGATIVE

## 2024-01-22 LAB — PHOSPHORUS: Phosphorus: 1.5 mg/dL — ABNORMAL LOW (ref 2.5–4.6)

## 2024-01-22 LAB — LACTIC ACID, PLASMA: Lactic Acid, Venous: 2.1 mmol/L (ref 0.5–1.9)

## 2024-01-22 LAB — MAGNESIUM: Magnesium: 2 mg/dL (ref 1.7–2.4)

## 2024-01-22 MED ORDER — SODIUM PHOSPHATES 45 MMOLE/15ML IV SOLN
15.0000 mmol | Freq: Once | INTRAVENOUS | Status: AC
  Administered 2024-01-22: 15 mmol via INTRAVENOUS
  Filled 2024-01-22: qty 5

## 2024-01-22 MED ORDER — FUROSEMIDE 10 MG/ML IJ SOLN
40.0000 mg | Freq: Two times a day (BID) | INTRAMUSCULAR | Status: DC
  Administered 2024-01-22 – 2024-01-25 (×6): 40 mg via INTRAVENOUS
  Filled 2024-01-22 (×6): qty 4

## 2024-01-22 MED ORDER — ARFORMOTEROL TARTRATE 15 MCG/2ML IN NEBU
15.0000 ug | INHALATION_SOLUTION | Freq: Two times a day (BID) | RESPIRATORY_TRACT | Status: DC
  Administered 2024-01-22 – 2024-01-25 (×8): 15 ug via RESPIRATORY_TRACT
  Filled 2024-01-22 (×8): qty 2

## 2024-01-22 MED ORDER — VANCOMYCIN HCL 750 MG/150ML IV SOLN
750.0000 mg | Freq: Two times a day (BID) | INTRAVENOUS | Status: DC
  Administered 2024-01-22 – 2024-01-23 (×4): 750 mg via INTRAVENOUS
  Filled 2024-01-22 (×6): qty 150

## 2024-01-22 MED ORDER — SENNOSIDES-DOCUSATE SODIUM 8.6-50 MG PO TABS
1.0000 | ORAL_TABLET | Freq: Two times a day (BID) | ORAL | Status: DC
  Administered 2024-01-22 – 2024-01-26 (×9): 1 via ORAL
  Filled 2024-01-22 (×10): qty 1

## 2024-01-22 MED ORDER — BUDESONIDE 0.25 MG/2ML IN SUSP
0.2500 mg | Freq: Two times a day (BID) | RESPIRATORY_TRACT | Status: DC
  Administered 2024-01-22 – 2024-01-25 (×8): 0.25 mg via RESPIRATORY_TRACT
  Filled 2024-01-22 (×8): qty 2

## 2024-01-22 MED ORDER — FUROSEMIDE 10 MG/ML IJ SOLN: 20.0000 mg | Freq: Once | INTRAMUSCULAR | Status: DC

## 2024-01-22 MED ORDER — FUROSEMIDE 10 MG/ML IJ SOLN: 40.0000 mg | Freq: Once | INTRAMUSCULAR | Status: DC

## 2024-01-22 MED ORDER — FUROSEMIDE 10 MG/ML IJ SOLN
40.0000 mg | Freq: Once | INTRAMUSCULAR | Status: AC
  Administered 2024-01-22: 40 mg via INTRAVENOUS
  Filled 2024-01-22: qty 4

## 2024-01-22 MED ORDER — OXYCODONE HCL 5 MG PO TABS
10.0000 mg | ORAL_TABLET | ORAL | Status: DC | PRN
  Administered 2024-01-22: 10 mg via ORAL
  Filled 2024-01-22: qty 2

## 2024-01-22 MED ORDER — SODIUM CHLORIDE 0.9% FLUSH: 10.0000 mL | INTRAVENOUS | Status: DC | PRN

## 2024-01-22 MED ORDER — REVEFENACIN 175 MCG/3ML IN SOLN
175.0000 ug | Freq: Every day | RESPIRATORY_TRACT | Status: DC
  Administered 2024-01-22 – 2024-01-25 (×4): 175 ug via RESPIRATORY_TRACT
  Filled 2024-01-22 (×4): qty 3

## 2024-01-22 MED ORDER — OXYCODONE HCL 5 MG PO TABS: 5.0000 mg | ORAL_TABLET | ORAL | Status: DC | PRN

## 2024-01-22 MED ORDER — CHLORHEXIDINE GLUCONATE CLOTH 2 % EX PADS
6.0000 | MEDICATED_PAD | Freq: Every day | CUTANEOUS | Status: DC
  Administered 2024-01-22 – 2024-01-27 (×6): 6 via TOPICAL

## 2024-01-22 NOTE — Progress Notes (Signed)
 Peripherally Inserted Central Catheter Placement  The IV Nurse has discussed with the patient and/or persons authorized to consent for the patient, the purpose of this procedure and the potential benefits and risks involved with this procedure.  The benefits include less needle sticks, lab draws from the catheter, and the patient may be discharged home with the catheter. Risks include, but not limited to, infection, bleeding, blood clot (thrombus formation), and puncture of an artery; nerve damage and irregular heartbeat and possibility to perform a PICC exchange if needed/ordered by physician.  Alternatives to this procedure were also discussed.  Bard Power PICC patient education guide, fact sheet on infection prevention and patient information card has been provided to patient /or left at bedside.    PICC Placement Documentation  PICC Double Lumen 01/22/24 Right Basilic 40 cm 0 cm (Active)  Indication for Insertion or Continuance of Line Chronic illness with exacerbations (CF, Sickle Cell, etc.) 01/22/24 1453  Exposed Catheter (cm) 0 cm 01/22/24 1453  Site Assessment Clean, Dry, Intact 01/22/24 1453  Lumen #1 Status Saline locked;Flushed;Blood return noted 01/22/24 1453  Lumen #2 Status Saline locked;Flushed;Blood return noted 01/22/24 1453  Dressing Type Transparent;Securing device 01/22/24 1453  Dressing Status Antimicrobial disc/dressing in place;Clean, Dry, Intact 01/22/24 1453  Line Care Connections checked and tightened 01/22/24 1453  Line Adjustment (NICU/IV Team Only) No 01/22/24 1453  Dressing Intervention New dressing;Adhesive placed at insertion site (IV team only) 01/22/24 1453  Dressing Change Due 01/29/24 01/22/24 1453       Cody Tate 01/22/2024, 2:54 PM

## 2024-01-22 NOTE — Progress Notes (Signed)
 Echocardiogram Attempted exam at 240pm. Could not enter room due to sterile procedure in progress. Will attempt again later.  Juliene JINNY Rucks 01/22/2024, 2:43 PM

## 2024-01-22 NOTE — Progress Notes (Signed)
 Pharmacy Antibiotic Note  Cody Tate is a 74 y.o. male for which pharmacy has been consulted for cefepime  and vancomycin  dosing for sepsis.  SCr 1.03 > 0.67 WBC 1.9; LA 2.9>3.2; T 97.8; HR 109; RR 22 COVID neg / flu neg  Plan: Cefepime  2g q8hr  Change vanco dose / freq to: 750 mg iv q12h [eAUC 443 / Tss 12.8 / scr 0.8 utilized] De-escalate when able  Height: 5' 6.5 (168.9 cm) Weight: 100.7 kg (222 lb) IBW/kg (Calculated) : 64.95  Temp (24hrs), Avg:98.1 F (36.7 C), Min:97.8 F (36.6 C), Max:98.3 F (36.8 C)  Recent Labs  Lab 01/20/24 1441 01/20/24 1447 01/20/24 1744 01/20/24 1752 01/20/24 2328 01/21/24 0221 01/21/24 0537 01/21/24 1110 01/22/24 0331  WBC 1.9*  --   --   --   --   --  11.3*  --  18.1*  CREATININE  --   --  1.65*  --   --   --  1.03  --  0.67  LATICACIDVEN  --    < >  --  3.2* 3.1* 2.9* 2.7* 4.0*  --    < > = values in this interval not displayed.    Estimated Creatinine Clearance: 90.9 mL/min (by C-G formula based on SCr of 0.67 mg/dL).    Allergies[1]  Microbiology results: 1/19 bcx- ng2  Thank you for allowing pharmacy to be a part of this patients care.   Tan Clopper BS, PharmD, BCPS Clinical Pharmacist 01/22/2024 7:27 AM  Contact: [phone] after 3 PM     [1]  Allergies Allergen Reactions   Amlodipine Besylate Other (See Comments)    fatigue   Aspirin  Other (See Comments)     GI upset   Other Itching, Other (See Comments), Rash and Dermatitis    VICRYL Sutures   Tape Itching, Other (See Comments) and Rash    Occlusive tape and bandaids   Wound Dressing Adhesive Rash

## 2024-01-22 NOTE — Plan of Care (Signed)
" °  Problem: Clinical Measurements: Goal: Will remain free from infection Outcome: Progressing Goal: Cardiovascular complication will be avoided Outcome: Progressing   Problem: Nutrition: Goal: Adequate nutrition will be maintained Outcome: Progressing   Problem: Clinical Measurements: Goal: Respiratory complications will improve Outcome: Not Progressing   Problem: Activity: Goal: Risk for activity intolerance will decrease Outcome: Not Progressing   Problem: Coping: Goal: Level of anxiety will decrease Outcome: Not Progressing   "

## 2024-01-22 NOTE — Progress Notes (Signed)
 [facility] Progress Note Patient: Cody Tate FMW:[id number] DOB: Sep 02, 1950  DOA: 01/20/2024 DOS: the patient was seen and examined on 01/22/2024  Brief Summary: Patient with PMH of COPD, HTN, chronic HFpEF, CAD, liver cirrhosis, neuropathy, GERD, chronic pain, HLD, chronic edema present to the hospital with complaints of shortness of breath cough lethargy as well as boil on the left leg for 1 week with confusion. Was found to have severe sepsis due to cellulitis.  X-ray also shows possibility of pneumonia.  Viral test were negative.  Significant events: 1/19 admitted to the hospital.  Consults: None  Assessment and Plan: Severe sepsis due to combination of pneumonia as well as cellulitis. Primary reason for presentation is boil on his left inner thigh. CT scan shows evidence of significant subcutaneous tissue without any gas formation. No evidence of bony involvement. Blood cultures so far negative. Started on IV vancomycin  and cefepime .  Acute hypoxic respiratory failure secondary to left lobar community-acquired pneumonia. At baseline uses 2 LPM. Currently on 10 LPM. VBG shows evidence of hypoxia. In significant respiratory distress. Will continue with IV antibiotics.  Acute on chronic HFpEF. HTN. On Lasix  at home. Prior echocardiogram shows EF of 7075%. Will order echocardiogram. Appears to be significantly volume overloaded. Treated with IV Lasix  monitor response. BiPAP as needed. Currently holding other medications for HTN to allow room for diuresis.  Chronic pain syndrome. Chronically on pain medication 10 mg every 4 hours of oxycodone . Currently switching it to as needed only due to presentation with confusion.  BPH. Has been on Flomax . Continue for now.  Chronic constipation. Will treat aggressive with bowel regimen.  COPD. Appears to be in respiratory distress although does not appear to have any significant COPD exacerbation. Due to his  respiratory distress I will switch him from Breztri  to Brovana  Pulmicort  Yupelri  combination.  Acute metabolic encephalopathy. Combination of hypoxia as well as sepsis. Monitor for improvement. Medication adjusted.  AKI on CKD stage II. Renal function was abnormal at the time of admission.  Appears to be improving after receiving IV hydration although currently appears volume overloaded and therefore receiving IV Lasix .  monitor closely  History of spinal cord stimulator. Monitor for now.  Bilateral lower extremity edema. Appears to be chronic. Patient is noncompliant with Lasix  and Aldactone  at home. Lower extremity Doppler ordered.  HLD. On Lipitor. CK level normal.  GERD. Continue PPI.  Hypophosphatemia. Replacing with IV sodium phosphate . Monitor.  Obesity class II. Body mass index is 35.29 kg/m.  Placing the patient at high risk of poor outcome.  Goals of care   Code Status: Full Code  Patient would benefit from further conversation about palliative care due to his liver cirrhosis and compliance issues.  DVT Prophylaxis: SCDs Start: 01/20/24 1927 Place TED hose Start: 01/20/24 1927   Data review I have Reviewed nursing notes, Vitals, and Lab results. Since last encounter, pertinent lab results CBC and CMP   . I have ordered test including CBC and CMP  . I have ordered imaging echocardiogram lower extremity Doppler  .   Family Communication: Family at bedside  Disposition Plan: Status is: Inpatient Remains inpatient appropriate because: Critically ill.  Requiring significant amount of oxygen.   Planned Discharge Destination: TBD Diet: Diet Order             Diet Heart Room service appropriate? Yes; Fluid consistency: Thin  Diet effective now  MEDICATIONS: Scheduled Meds:  arformoterol   15 mcg Nebulization BID   vitamin C   1,000 mg Oral Daily   budesonide  (PULMICORT ) nebulizer solution  0.25 mg Nebulization BID    Chlorhexidine  Gluconate Cloth  6 each Topical Daily   enoxaparin  (LOVENOX ) injection  50 mg Subcutaneous Q24H   fluticasone   1 spray Each Nare BID   furosemide   40 mg Intravenous BID   pantoprazole   40 mg Oral Daily   revefenacin   175 mcg Nebulization Daily   rosuvastatin   20 mg Oral QHS   senna-docusate  1 tablet Oral BID   timolol   1 drop Both Eyes QHS   Continuous Infusions:  ceFEPime  (MAXIPIME ) IV 2 g (01/22/24 1804)   sodium PHOSPHATE  IVPB (in mmol) 15 mmol (01/22/24 1553)   vancomycin  750 mg (01/22/24 1212)   PRN Meds:.acetaminophen  **OR** acetaminophen , ipratropium-albuterol , ondansetron  **OR** ondansetron  (ZOFRAN ) IV, oxyCODONE  **OR** oxyCODONE , oxymetazoline , simethicone , sodium chloride  flush The patient is critically ill with multiple organ systems failure and requires high complexity decision making for assessment and support, frequent evaluation and titration of therapies. Critical Care Time devoted to patient care services described in this note is 35 minutes   Author: Yetta Blanch, MD  [facility] 01/22/2024  6:12 PM Between 7PM-7AM, please contact night-coverage, check [email or web address] for on call.

## 2024-01-22 NOTE — Progress Notes (Signed)
 BLE venous duplex has been completed.   Results can be found under chart review under CV PROC. 01/22/2024 2:36 PM Ladoris Lythgoe RVT, RDMS

## 2024-01-22 NOTE — TOC Initial Note (Signed)
 Transition of Care [facility]) - Initial/Assessment Note    Patient Details  Name: Cody Tate MRN: [id number] Date of Birth: 02-02-1950  Transition of Care [facility]) CM/SW Contact:    Landry DELENA Senters, RN Phone Number: 01/22/2024, 4:19 PM  Clinical Narrative:                 RR:[id number] history significant of COPD, essential hypertension, chronic lower extremity edema, diastolic heart failure, CAD on medical management, hepatic cirrhosis peripheral neuropathy, GERD, chronic pain syndrome, hyperlipidemia and chronic constipation presented to emergency department with multiple complaint include left leg boil for 1 week, altered mental status, shortness of breath, cough and lethargy.  EMS reported that patient blood pressure is soft in low 70s and shortness of breath O2 sat 80% room air.  In the ED patient found alert oriented x 4 however lethargic.   Patient lives with wife, who will be transportation home at discharge. Patient reports he normally drives himself, has PCP, manages own medications, DME reviewed-rollator, cane, 2L home O2 through [employer].  Wife does report she may not be able to get tank from home for transportation. May need [employer] to send a tank for transport home at d/c.   Patient has been active with [facility] last year, verified in [place]. Patient's wife reports if HH is rec. They would like to have [facility] again.  Currently waiting on therapy evals.   CM will continue to follow.  Expected Discharge Plan:  (TBD) Barriers to Discharge: Continued Medical Work up   Patient Goals and CMS Choice            Expected Discharge Plan and Services       Living arrangements for the past 2 months: Single Family Home                                      Prior Living Arrangements/Services Living arrangements for the past 2 months: Single Family Home Lives with:: Self Patient language and need for interpreter reviewed:: Yes Do you feel safe going back to the place where  you live?: Yes      Need for Family Participation in Patient Care: Yes (Comment) Care giver support system in place?: Yes (comment) Current home services: DME (cane, rollator, 2L O2 through [employer]) Criminal Activity/Legal Involvement Pertinent to Current Situation/Hospitalization: No - Comment as needed  Activities of Daily Living   ADL Screening (condition at time of admission) Independently performs ADLs?: Yes (appropriate for developmental age) Is the patient deaf or have difficulty hearing?: No Does the patient have difficulty seeing, even when wearing glasses/contacts?: No Does the patient have difficulty concentrating, remembering, or making decisions?: No  Permission Sought/Granted                  Emotional Assessment Appearance:: Developmentally appropriate Attitude/Demeanor/Rapport: Engaged Affect (typically observed): Calm Orientation: : Oriented to Self, Oriented to Place, Oriented to  Time, Oriented to Situation Alcohol  / Substance Use: Not Applicable Psych Involvement: No (comment)  Admission diagnosis:  Sepsis (HCC) [A41.9] Cellulitis of lower extremity, unspecified laterality [L03.119] Pneumonia due to infectious organism, unspecified laterality, unspecified part of lung [J18.9] Sepsis, due to unspecified organism, unspecified whether acute organ dysfunction present [facility]) [A41.9] Patient Active Problem List   Diagnosis Date Noted   Cellulitis of left lower extremity 01/20/2024   Leukopenia 01/20/2024   Neutropenia 01/20/2024   Glaucoma 09/18/2023   Exudative age-related macular  degeneration, bilateral, with active choroidal neovascularization (HCC) 09/18/2023   Atherosclerosis of aorta 09/18/2023   Acquired hypocalciuric hypercalcemia 09/18/2023   History of colonic polyps 09/18/2023   Iron deficiency 09/18/2023   Intestinal malabsorption 09/18/2023   Morbid obesity (HCC) 09/18/2023   Venous insufficiency of lower extremity 09/18/2023   Chronic  diastolic CHF (congestive heart failure) (HCC) 06/28/2023   Pneumonia of both lower lobes due to infectious organism 06/25/2023   Acute kidney injury superimposed on chronic kidney disease 06/25/2023   Opioid-induced constipation 05/26/2023   Empyema (HCC) 05/24/2023   Parapneumonic effusion 05/23/2023   Acute hypoxic respiratory failure (HCC) 05/23/2023   Acute hyponatremia 05/23/2023   Hypokalemia 05/23/2023   Sepsis (HCC) 05/22/2023   CAP (community acquired pneumonia) 05/02/2023   Hemoptysis 05/02/2023   Mild mitral regurgitation 10/30/2022   Aortic valve stenosis 10/30/2022   COPD (chronic obstructive pulmonary disease) (HCC) 01/11/2022   GERD without esophagitis 01/11/2022   Hyperlipidemia 01/11/2022   Essential hypertension 01/11/2022   Obesity, Class II, BMI 35-39.9 01/11/2022   Other chronic pain 01/11/2022   Pulmonary nodule 01/11/2022   Lumbar foraminal stenosis 01/11/2022   Aortic ectasia 01/11/2022   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   Malignant neoplasm of prostate (HCC) 07/12/2020   S/P insertion of spinal cord stimulator 01/16/2018   Back pain with history of spinal surgery 09/09/2017   Inflammation of sacroiliac joint 09/20/2016   Opioid dependence (HCC) 01/10/2016   Swelling of lower limb 03/25/2014   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   Complains of low back pain 09/09/2013   Lumbosacral radiculitis 12/03/2012   Spinal stenosis of lumbar region 10/30/2012   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 [place] [address] Phone: [phone] Fax: [phone]  [facility]) - [place], [place] - [address] [facility] [address] [place] [address] [place] [address] Phone: [phone] Fax: [phone]     Social Drivers of Health (SDOH) Social History: SDOH Screenings   Food Insecurity: No Food Insecurity (01/21/2024)  Housing: Low Risk (01/21/2024)  Transportation Needs: No Transportation Needs (01/21/2024)  Utilities: Not At Risk (01/21/2024)  Social Connections: Moderately Integrated (01/21/2024)  Tobacco Use: Medium Risk (01/20/2024)   SDOH Interventions:     Readmission Risk Interventions    06/28/2023    3:10 PM  Readmission Risk Prevention Plan  Transportation Screening Complete  PCP or Specialist Appt within 5-7 Days Complete  Home Care Screening Complete  Medication Review (RN CM) Complete

## 2024-01-23 ENCOUNTER — Inpatient Hospital Stay (HOSPITAL_COMMUNITY)

## 2024-01-23 DIAGNOSIS — A419 Sepsis, unspecified organism: Secondary | ICD-10-CM | POA: Diagnosis not present

## 2024-01-23 DIAGNOSIS — J9601 Acute respiratory failure with hypoxia: Secondary | ICD-10-CM | POA: Diagnosis not present

## 2024-01-23 DIAGNOSIS — R0609 Other forms of dyspnea: Secondary | ICD-10-CM | POA: Diagnosis not present

## 2024-01-23 DIAGNOSIS — R6520 Severe sepsis without septic shock: Secondary | ICD-10-CM | POA: Diagnosis not present

## 2024-01-23 LAB — BASIC METABOLIC PANEL WITH GFR
Anion gap: 17 — ABNORMAL HIGH (ref 5–15)
Anion gap: 14 (ref 5–15)
BUN: 25 mg/dL — ABNORMAL HIGH (ref 8–23)
BUN: 27 mg/dL — ABNORMAL HIGH (ref 8–23)
CO2: 24 mmol/L (ref 22–32)
CO2: 28 mmol/L (ref 22–32)
Calcium: 9.4 mg/dL (ref 8.9–10.3)
Calcium: 9.5 mg/dL (ref 8.9–10.3)
Chloride: 96 mmol/L — ABNORMAL LOW (ref 98–111)
Chloride: 96 mmol/L — ABNORMAL LOW (ref 98–111)
Creatinine, Ser: 0.68 mg/dL (ref 0.61–1.24)
Creatinine, Ser: 0.73 mg/dL (ref 0.61–1.24)
GFR, Estimated: >60 mL/min
GFR, Estimated: >60 mL/min
Glucose, Bld: 94 mg/dL (ref 70–99)
Glucose, Bld: 98 mg/dL (ref 70–99)
Potassium: 2.8 mmol/L — ABNORMAL LOW (ref 3.5–5.1)
Potassium: 3.1 mmol/L — ABNORMAL LOW (ref 3.5–5.1)
Sodium: 136 mmol/L (ref 135–145)
Sodium: 138 mmol/L (ref 135–145)

## 2024-01-23 LAB — CBC WITH DIFFERENTIAL/PLATELET
Abs Immature Granulocytes: 0.14 K/uL — ABNORMAL HIGH (ref 0.00–0.07)
Basophils Absolute: 0.2 K/uL — ABNORMAL HIGH (ref 0.0–0.1)
Basophils Relative: 1 %
Eosinophils Absolute: 0 K/uL (ref 0.0–0.5)
Eosinophils Relative: 0 %
HCT: 41.1 % (ref 39.0–52.0)
Hemoglobin: 14.2 g/dL (ref 13.0–17.0)
Immature Granulocytes: 1 %
Lymphocytes Relative: 3 %
Lymphs Abs: 0.7 K/uL (ref 0.7–4.0)
MCH: 30.5 pg (ref 26.0–34.0)
MCHC: 34.5 g/dL (ref 30.0–36.0)
MCV: 88.2 fL (ref 80.0–100.0)
Monocytes Absolute: 1.3 K/uL — ABNORMAL HIGH (ref 0.1–1.0)
Monocytes Relative: 6 %
Neutro Abs: 20.7 K/uL — ABNORMAL HIGH (ref 1.7–7.7)
Neutrophils Relative %: 89 %
Platelets: 291 K/uL (ref 150–400)
RBC: 4.66 MIL/uL (ref 4.22–5.81)
RDW: 13.1 % (ref 11.5–15.5)
Smear Review: NORMAL
WBC: 22.9 K/uL — ABNORMAL HIGH (ref 4.0–10.5)
nRBC: 0 % (ref 0.0–0.2)

## 2024-01-23 LAB — ECHOCARDIOGRAM COMPLETE
AR max vel: 3.2 cm2
AV Area VTI: 3.47 cm2
AV Area mean vel: 2.98 cm2
AV Mean grad: 12 mmHg
AV Peak grad: 19.5 mmHg
Ao pk vel: 2.21 m/s
Area-P 1/2: 7.44 cm2
Calc EF: 69.3 %
Est EF: >75
Height: 66.5 in
S' Lateral: 2.8 cm
Single Plane A2C EF: 72.7 %
Single Plane A4C EF: 69.7 %
Weight: 3552 [oz_av]

## 2024-01-23 LAB — HEPATIC FUNCTION PANEL
ALT: 22 U/L (ref 0–44)
AST: 44 U/L — ABNORMAL HIGH (ref 15–41)
Albumin: 3 g/dL — ABNORMAL LOW (ref 3.5–5.0)
Alkaline Phosphatase: 167 U/L — ABNORMAL HIGH (ref 38–126)
Bilirubin, Direct: 0.3 mg/dL — ABNORMAL HIGH (ref 0.0–0.2)
Indirect Bilirubin: 0.5 mg/dL (ref 0.3–0.9)
Total Bilirubin: 0.8 mg/dL (ref 0.0–1.2)
Total Protein: 5.8 g/dL — ABNORMAL LOW (ref 6.5–8.1)

## 2024-01-23 LAB — PROCALCITONIN: Procalcitonin: 2.75 ng/mL

## 2024-01-23 LAB — AMMONIA: Ammonia: 21 umol/L (ref 9–35)

## 2024-01-23 LAB — MAGNESIUM: Magnesium: 1.5 mg/dL — ABNORMAL LOW (ref 1.7–2.4)

## 2024-01-23 LAB — C-REACTIVE PROTEIN: CRP: 27.6 mg/dL — ABNORMAL HIGH

## 2024-01-23 MED ORDER — MAGNESIUM SULFATE 4 GM/100ML IV SOLN
4.0000 g | Freq: Once | INTRAVENOUS | Status: AC
  Administered 2024-01-23: 4 g via INTRAVENOUS
  Filled 2024-01-23: qty 100

## 2024-01-23 MED ORDER — PROCHLORPERAZINE EDISYLATE 10 MG/2ML IJ SOLN
INTRAMUSCULAR | Status: AC
  Filled 2024-01-23: qty 2

## 2024-01-23 MED ORDER — MORPHINE SULFATE (PF) 2 MG/ML IV SOLN
1.0000 mg | Freq: Once | INTRAVENOUS | Status: AC
  Administered 2024-01-23: 1 mg via INTRAVENOUS
  Filled 2024-01-23: qty 1

## 2024-01-23 MED ORDER — OXYCODONE HCL 5 MG PO TABS
5.0000 mg | ORAL_TABLET | Freq: Three times a day (TID) | ORAL | Status: DC
  Administered 2024-01-23 – 2024-01-24 (×3): 5 mg via ORAL
  Filled 2024-01-23 (×3): qty 1

## 2024-01-23 MED ORDER — PROCHLORPERAZINE EDISYLATE 10 MG/2ML IJ SOLN
5.0000 mg | Freq: Once | INTRAMUSCULAR | Status: AC
  Administered 2024-01-23: 5 mg via INTRAVENOUS

## 2024-01-23 MED ORDER — OXYCODONE HCL 5 MG PO TABS: 10.0000 mg | ORAL_TABLET | Freq: Three times a day (TID) | ORAL | Status: DC | PRN

## 2024-01-23 MED ORDER — FLUCONAZOLE 100 MG PO TABS
100.0000 mg | ORAL_TABLET | Freq: Every day | ORAL | Status: DC
  Administered 2024-01-24 – 2024-01-27 (×4): 100 mg via ORAL
  Filled 2024-01-23 (×4): qty 1

## 2024-01-23 MED ORDER — FLUCONAZOLE 100 MG PO TABS
200.0000 mg | ORAL_TABLET | Freq: Once | ORAL | Status: AC
  Administered 2024-01-23: 200 mg via ORAL
  Filled 2024-01-23: qty 2

## 2024-01-23 MED ORDER — POTASSIUM CHLORIDE CRYS ER 20 MEQ PO TBCR
40.0000 meq | EXTENDED_RELEASE_TABLET | ORAL | Status: DC
  Administered 2024-01-23: 40 meq via ORAL
  Filled 2024-01-23 (×2): qty 2

## 2024-01-23 MED ORDER — PIPERACILLIN-TAZOBACTAM 3.375 G IVPB
3.3750 g | Freq: Three times a day (TID) | INTRAVENOUS | Status: DC
  Administered 2024-01-23 – 2024-01-24 (×3): 3.375 g via INTRAVENOUS
  Filled 2024-01-23 (×3): qty 50

## 2024-01-23 MED ORDER — POTASSIUM CHLORIDE 10 MEQ/100ML IV SOLN
10.0000 meq | INTRAVENOUS | Status: AC
  Administered 2024-01-23 (×6): 10 meq via INTRAVENOUS
  Filled 2024-01-23 (×5): qty 100

## 2024-01-23 MED ORDER — BISACODYL 10 MG RE SUPP
10.0000 mg | Freq: Once | RECTAL | Status: AC
  Administered 2024-01-23: 10 mg via RECTAL
  Filled 2024-01-23: qty 1

## 2024-01-23 MED ORDER — IOHEXOL 350 MG/ML SOLN
75.0000 mL | Freq: Once | INTRAVENOUS | Status: AC | PRN
  Administered 2024-01-23: 75 mL via INTRAVENOUS

## 2024-01-23 NOTE — Plan of Care (Signed)
 Patient was noticed to getting more confused also complaining of abdominal pain had 1 episode of nausea vomiting.  On exam at bedside patient is maintaining sats well.  Oriented to his name following commands moving all extremities.  I reviewed patient's labs notes and medications.  ABG done shows pH of 7.54 pCO2 of 29.  I ordered CT head and CT abdomen pelvis.  Further plans based on this.  Redia Cleaver MD.

## 2024-01-23 NOTE — Progress Notes (Signed)
 Red MEWS overnight 2/2 respirations and HR. Pt also disoriented x 4--unable to state birth year--repeatedly stating I don't know. No facial droop or extremity weakness noted. Able to follow commands. Dr. MARLA notified and at bedside. ABG and ammonia level ordered.   This AM during rounds, patient noted with large amount of yellow emesis on gown. Abdominal assessment completed and documented. Pt reports pain with palpation x 4 but also still disoriented and repeatedly saying yes with everything asked. VS- BP 154/85, HR 114, R 33, T 98.1, O2 97 RA. MEWS continues to fire red. Dr. MARLA. Notified. CT head/abdomen ordered.

## 2024-01-23 NOTE — Progress Notes (Signed)
 [facility] Progress Note Patient: Cody Tate FMW:[id number] DOB: 07-15-1950  DOA: 01/20/2024 DOS: the patient was seen and examined on 01/23/2024  Brief Summary: Patient with PMH of COPD, HTN, chronic HFpEF, CAD, liver cirrhosis, neuropathy, GERD, chronic pain, HLD, chronic edema present to the hospital with complaints of shortness of breath cough lethargy as well as boil on the left leg for 1 week with confusion. Was found to have severe sepsis due to cellulitis.  X-ray also shows possibility of pneumonia.  Viral test were negative.   Significant events: 1/19 admitted to the hospital.  CT PE protocol negative for PE. 1/21 lower extremity Doppler negative for DVT.  ABG shows significant hypoxia. 1/22 echocardiogram shows EF more than 75%, no significant valvular abnormality. CT head unremarkable for any acute abnormality.  CT abdomen and pelvis unremarkable for acute abnormality.  Shows some constipation.  Consults: Palliative care    Assessment and Plan: Severe sepsis due to combination of pneumonia as well as cellulitis. Primary reason for presentation is boil on his left inner thigh. CT scan shows evidence of significant subcutaneous tissue without any gas formation. No evidence of bony involvement. Blood cultures so far negative. Started on IV vancomycin  and cefepime . Blood cultures so far negative. Prior history of Pseudomonas and therefore we will continue with pseudomonal coverage.   Acute hypoxic respiratory failure secondary to left lobar community-acquired pneumonia. At baseline uses 2 LPM. Required 10 LPM on 1/21. VBG shows evidence of hypoxia. Treated with IV antibiotic as well as IV Lasix . BiPAP as needed.  Currently on room air.   Acute on chronic HFpEF. HTN. On Lasix  at home. Prior echocardiogram shows EF of 70-75%. Current echocardiogram unchanged. Significantly volume overloaded, responding well to IV Lasix  will continue IV Lasix  for now. BiPAP as  needed. Currently holding other medications for HTN to allow room for diuresis.   Chronic pain syndrome. Chronically on pain medication 10 mg every 4 hours of oxycodone . Patient presented with significant confusion and respiratory distress and therefore medication was switched to as needed. At present there is concern for opioid withdrawal and therefore we will provide lower dose of oxycodone  5 mg every 8 hours on a scheduled basis and continue 10 mg every 8 hours as needed.   BPH. Has been on Flomax . Continue for now.   Chronic constipation. Will treat aggressive with bowel regimen.  Likely due to poor pain medications.   COPD. Appears to be in respiratory distress although does not appear to have any significant COPD exacerbation. Due to his respiratory distress I will switch him from Breztri  to Brovana  Pulmicort  Yupelri  combination.   Acute metabolic encephalopathy. Combination of hypoxia as well as sepsis.  Also possible opioid withdrawal. Monitor for improvement. Medication adjusted.   AKI on CKD stage II. Baseline creatinine 0.7. Creatinine on admission 1.65. Currently improving back to 0.7. Monitor while receiving IV Lasix .   History of spinal cord stimulator. Monitor for now.   Bilateral lower extremity edema. Appears to be chronic. Patient is noncompliant with Lasix  and Aldactone  at home. Lower extremity Doppler negative for DVT.  Monitor.   HLD. On Lipitor. CK level normal.   GERD. Continue PPI.   Hypophosphatemia. Replacing with IV sodium phosphate . Monitor.   Obesity class II. Body mass index is 35.29 kg/m.  Placing the patient at high risk of poor outcome.   Goals of care Discussed with wife at bedside.  Patient reportedly has a living will.  Per outpatient MOST form patient would like CPR  and full aggressive measures. Palliative care was also consulted.  Poor p.o. intake. Family reports poor p.o. intake concerns. Likely in the setting of  severe critical illness. For now we will monitor.  Hypokalemia. Hypomagnesemia. Hypophosphatemia. Replacement Will recheck.   Code Status: Full Code   DVT Prophylaxis: SCDs Start: 01/20/24 1927 Place TED hose Start: 01/20/24 1927  Data review I have Reviewed nursing notes, Vitals, and Lab results. Since last encounter, pertinent lab results CBC and BMP   . I have ordered test including CBC and BMP  . I have discussed pt's care plan and test results with palliative care  .  Family Communication: Family at bedside.  Disposition Plan: Status is: Inpatient Remains inpatient appropriate because: Monitor for improvement in mentation as well as infection   Planned Discharge Destination: To be determined Diet: Diet Order             DIET DYS 3 Room service appropriate? Yes; Fluid consistency: Thin  Diet effective now                   MEDICATIONS: Scheduled Meds:  arformoterol   15 mcg Nebulization BID   vitamin C   1,000 mg Oral Daily   budesonide  (PULMICORT ) nebulizer solution  0.25 mg Nebulization BID   Chlorhexidine  Gluconate Cloth  6 each Topical Daily   enoxaparin  (LOVENOX ) injection  50 mg Subcutaneous Q24H   [START ON 01/24/2024] fluconazole   100 mg Oral Daily   fluticasone   1 spray Each Nare BID   furosemide   40 mg Intravenous BID   oxyCODONE   5 mg Oral Q8H   pantoprazole   40 mg Oral Daily   revefenacin   175 mcg Nebulization Daily   rosuvastatin   20 mg Oral QHS   senna-docusate  1 tablet Oral BID   timolol   1 drop Both Eyes QHS   Continuous Infusions:  piperacillin -tazobactam (ZOSYN )  IV 3.375 g (01/23/24 1343)   potassium chloride  10 mEq (01/23/24 1836)   vancomycin  750 mg (01/23/24 1016)   PRN Meds:.acetaminophen  **OR** acetaminophen , ipratropium-albuterol , ondansetron  **OR** ondansetron  (ZOFRAN ) IV, oxyCODONE , oxymetazoline , simethicone , sodium chloride  flush  Author: Yetta Blanch, MD  [facility] 01/23/2024  7:00 PM Between 7PM-7AM, please  contact night-coverage, check [email or web address] for on call.

## 2024-01-23 NOTE — TOC Progression Note (Signed)
 Transition of Care [facility]) - Progression Note    Patient Details  Name: Cody Tate MRN: [id number] Date of Birth: Sep 07, 1950  [facility]) CM/SW Contact  Landry DELENA Senters, RN Phone Number: 01/23/2024, 12:37 PM  Clinical Narrative:    CM spoke with patient and wife about SNF rec per therapy.   Wife reports at this time, she will not consider SNF, wants to pursue [facility]. Reports she can take care of patient at home and has additional family support if needed.   Patient and family to have palliative meeting.   CM will continue to follow.   Expected Discharge Plan:  (TBD) Barriers to Discharge: Continued Medical Work up               Expected Discharge Plan and Services       Living arrangements for the past 2 months: Single Family Home                                       Social Drivers of Health (SDOH) Interventions SDOH Screenings   Food Insecurity: No Food Insecurity (01/21/2024)  Housing: Low Risk (01/21/2024)  Transportation Needs: No Transportation Needs (01/21/2024)  Utilities: Not At Risk (01/21/2024)  Social Connections: Moderately Integrated (01/21/2024)  Tobacco Use: Medium Risk (01/20/2024)    Readmission Risk Interventions    06/28/2023    3:10 PM  Readmission Risk Prevention Plan  Transportation Screening Complete  PCP or Specialist Appt within 5-7 Days Complete  Home Care Screening Complete  Medication Review (RN CM) Complete

## 2024-01-23 NOTE — Evaluation (Signed)
 Occupational Therapy Evaluation Patient Details Name: Cody Tate MRN: [id number] DOB: March 10, 1950 Today's Date: 01/23/2024   History of Present Illness   Pt is a 74 y/o male presenting with AMS, SOB, lethargy. Admitted for severe sepsis due to PNA and cellulitis, AKI. Chest CT also showed possible L pleural effusion. PMH: arthritis, BPH, COPD, COPD, glaucoma, heart murmur, HTN, multiple spinal surgeries, L knee arthroscopy.     Clinical Impressions PTA, pt lives with wife, typically Modified Independent with mobility using a walker in the home and scooter outside. Pt overall Modified Independent with ADLs aside from light, intermittent assist from wife. Pt presents now with deficits in cognition, balance, cardiopulmonary endurance and strength. Pt able to state name but not birthdate, able to follow commands inconsistently but also able to converse fairly appropriately. Pt requires Max A x 2 for bed mobility, Mod A x 2 for standing and steps along bedside, fatiguing quickly. Pt requires Max A for ADLs, +2 if in standing. Wife at bedside, reports preference for pt to return home with HHOT. However, based on current presentation, recommend continued inpatient follow up therapy, <3 hours/day as pt is at high risk for falls. Hope that as cognition improves that pt will make quick progress to return home safely with wife.     If plan is discharge home, recommend the following:   A lot of help with walking and/or transfers;Two people to help with walking and/or transfers;A lot of help with bathing/dressing/bathroom;Two people to help with bathing/dressing/bathroom     Functional Status Assessment   Patient has had a recent decline in their functional status and demonstrates the ability to make significant improvements in function in a reasonable and predictable amount of time.     Equipment Recommendations   BSC/3in1;Other (comment) (TBD)     Recommendations for Other Services          Precautions/Restrictions   Precautions Precautions: Fall;Other (comment) Recall of Precautions/Restrictions: Impaired Precaution/Restrictions Comments: watch O2/HR Restrictions Weight Bearing Restrictions Per Provider Order: No     Mobility Bed Mobility Overal bed mobility: Needs Assistance Bed Mobility: Supine to Sit, Sit to Supine     Supine to sit: Max assist, +2 for physical assistance, +2 for safety/equipment Sit to supine: Max assist, +2 for physical assistance, +2 for safety/equipment   General bed mobility comments: Pt assisting some with use of bedrails but required overall Max Ax  2 to sit EOB and return to supine. step by step cueing needed    Transfers Overall transfer level: Needs assistance Equipment used: Rolling walker (2 wheels) Transfers: Sit to/from Stand Sit to Stand: Mod assist, +2 physical assistance, +2 safety/equipment           General transfer comment: Mod A x2  to stand at bedside with RW, cues for hand placement and assist to correct posterior bias. Mod A x 2 for side steps along bedside with assist for RW, multimodal cues on step sequence      Balance Overall balance assessment: Needs assistance Sitting-balance support: No upper extremity supported, Feet supported Sitting balance-Leahy Scale: Fair     Standing balance support: Bilateral upper extremity supported, During functional activity Standing balance-Leahy Scale: Poor                             ADL either performed or assessed with clinical judgement   ADL Overall ADL's : Needs assistance/impaired Eating/Feeding: Minimal assistance;Sitting;Bed level   Grooming: Minimal assistance;Sitting;Bed  level   Upper Body Bathing: Maximal assistance;Sitting   Lower Body Bathing: Maximal assistance;+2 for safety/equipment;Sitting/lateral leans;Sit to/from stand   Upper Body Dressing : Maximal assistance;Sitting   Lower Body Dressing: Maximal assistance;+2 for  physical assistance;+2 for safety/equipment;Sitting/lateral leans;Sit to/from stand       Toileting- Architect and Hygiene: Maximal assistance;+2 for safety/equipment;Sitting/lateral lean;Sit to/from stand               Vision Baseline Vision/History: 1 Wears glasses Ability to See in Adequate Light: 0 Adequate Patient Visual Report: No change from baseline Vision Assessment?: No apparent visual deficits Additional Comments: but kept eyes closed for most of session. to be further assessed     Perception         Praxis         Pertinent Vitals/Pain Pain Assessment Pain Assessment: Faces Faces Pain Scale: Hurts a little bit Pain Location: generalized with movement Pain Descriptors / Indicators: Discomfort Pain Intervention(s): Monitored during session, Limited activity within patient's tolerance     Extremity/Trunk Assessment Upper Extremity Assessment Upper Extremity Assessment: Generalized weakness;Right hand dominant   Lower Extremity Assessment Lower Extremity Assessment: Defer to PT evaluation   Cervical / Trunk Assessment Cervical / Trunk Assessment: Kyphotic   Communication Communication Communication: Impaired Factors Affecting Communication: Hearing impaired   Cognition Arousal: Alert Behavior During Therapy: Flat affect Cognition: Cognition impaired   Orientation impairments: Person, Place, Time, Situation Awareness: Intellectual awareness impaired, Online awareness impaired Memory impairment (select all impairments): Working civil service fast streamer, Conservation officer, historic buildings, Short-term memory Attention impairment (select first level of impairment): Selective attention Executive functioning impairment (select all impairments): Initiation, Sequencing OT - Cognition Comments: kept eyes closed for much of session. unable to report birthdate but able to provide some accurate PLOF info. Cues and assist to initiate and sequence tasks. Pt responding to  humor                 Following commands: Impaired Following commands impaired: Follows one step commands inconsistently, Follows one step commands with increased time     Cueing  General Comments   Cueing Techniques: Verbal cues;Gestural cues;Tactile cues  Wife at bedside, supportive. Bedside RN present- noted desats to 87% on RA though deferred supp O2 d/t baseline COPD. HR 120s with activity   Exercises     Shoulder Instructions      Home Living Family/patient expects to be discharged to:: Private residence Living Arrangements: Spouse/significant other Available Help at Discharge: Family;Available 24 hours/day Type of Home: Mobile home Home Access: Stairs to enter Entrance Stairs-Number of Steps: 3 Entrance Stairs-Rails: Left;Can reach both;Right Home Layout: One level (reports 3 steps into kitchen)     Bathroom Shower/Tub: Producer, Television/film/video: Handicapped height Bathroom Accessibility: Yes   Home Equipment: Rollator (4 wheels);Shower seat - built in;Grab bars - Chartered Loss Adjuster (2 wheels);Electric scooter          Prior Functioning/Environment Prior Level of Function : Needs assist             Mobility Comments: uses smaller RW to access bathroom and rollator around the house. Electric scooter outside of the house ADLs Comments: wife assists with compression stockings, sometimes other LB dressing and bathing L side in shower d/t difficulty reaching. uses shower chair    OT Problem List: Decreased strength;Decreased activity tolerance;Impaired balance (sitting and/or standing);Decreased cognition;Decreased knowledge of use of DME or AE;Decreased knowledge of precautions;Cardiopulmonary status limiting activity   OT Treatment/Interventions: Self-care/ADL training;Therapeutic exercise;Energy conservation;DME and/or AE  instruction;Therapeutic activities;Patient/family education;Balance training      OT Goals(Current goals can be  found in the care plan section)   Acute Rehab OT Goals Patient Stated Goal: wife hopeful for pt to progress and be able to return home, hopes for cognitive improvements OT Goal Formulation: With patient/family Time For Goal Achievement: 02/06/24 Potential to Achieve Goals: Good ADL Goals Pt Will Transfer to Toilet: with min assist;ambulating Pt Will Perform Toileting - Clothing Manipulation and hygiene: with min assist;sit to/from stand;sitting/lateral leans Additional ADL Goal #1: Pt to complete bed mobility with no more than Min A in prep for EOB/OOB ADLs Additional ADL Goal #2: Pt to verbalize at least 3 energy conservation strategies to implement during ADLs/mobility   OT Frequency:  Min 2X/week    Co-evaluation PT/OT/SLP Co-Evaluation/Treatment: Yes Reason for Co-Treatment: For patient/therapist safety;To address functional/ADL transfers;Necessary to address cognition/behavior during functional activity PT goals addressed during session: Mobility/safety with mobility;Proper use of DME;Balance OT goals addressed during session: Proper use of Adaptive equipment and DME;Strengthening/ROM      AM-PAC OT 6 Clicks Daily Activity     Outcome Measure Help from another person eating meals?: A Little Help from another person taking care of personal grooming?: A Little Help from another person toileting, which includes using toliet, bedpan, or urinal?: A Lot Help from another person bathing (including washing, rinsing, drying)?: A Lot Help from another person to put on and taking off regular upper body clothing?: A Lot Help from another person to put on and taking off regular lower body clothing?: A Lot 6 Click Score: 14   End of Session Equipment Utilized During Treatment: Gait belt;Rolling walker (2 wheels) Nurse Communication: Mobility status  Activity Tolerance: Patient limited by fatigue Patient left: in bed;with call bell/phone within reach;with bed alarm set;with  family/visitor present;with nursing/sitter in room  OT Visit Diagnosis: Unsteadiness on feet (R26.81);Other abnormalities of gait and mobility (R26.89);Muscle weakness (generalized) (M62.81);Other symptoms and signs involving cognitive function                Time: 9058-8995 OT Time Calculation (min): 23 min Charges:  OT General Charges $OT Visit: 1 Visit OT Evaluation $OT Eval Moderate Complexity: 1 Mod  Mliss NOVAK, OTR/L Acute Rehab Services Office: [phone]   Mliss Fish 01/23/2024, 10:26 AM

## 2024-01-23 NOTE — Plan of Care (Signed)
" °  Problem: Clinical Measurements: Goal: Cardiovascular complication will be avoided Outcome: Progressing   Problem: Health Behavior/Discharge Planning: Goal: Ability to manage health-related needs will improve Outcome: Not Progressing   Problem: Clinical Measurements: Goal: Ability to maintain clinical measurements within normal limits will improve Outcome: Not Progressing Goal: Diagnostic test results will improve Outcome: Not Progressing   Problem: Activity: Goal: Risk for activity intolerance will decrease Outcome: Not Progressing   Problem: Nutrition: Goal: Adequate nutrition will be maintained Outcome: Not Progressing   "

## 2024-01-23 NOTE — Consult Note (Addendum)
 "                                                                                   Consultation Note Date: 01/23/2024   [name]  DOB: [birth year]  MRN: [id number]  Age / Sex: 74 y.o., male  PCP: Charlott Dorn LABOR, MD Referring Physician: Tobie Yetta HERO, MD  Reason for Consultation:  goals of care  HPI/Patient Profile: 74 y.o. male  with past medical history of hepatic cirrhosis- MELD 9, COPD, CHF, chronic pain, HLD, lower extremity edema  admitted on 01/20/2024 with complaints of SOB and lethargy. Workup revealed sepsis due to pneumonia and lower extremity cellulitis. He has had some confusion during admission. Palliative medicine consulted for GOC.    Primary Decision Maker NEXT OF KIN- spouse June Gains  Discussion: I have reviewed medical records from this and prior admissions-  ECHO today 1/22 showed hyperdynamic EF >75%, E-A fusion. BMET today shows albumin  3.0 which is higher than it was 8 months ago- 1.9, and Cr normal 0.68. Ammonia today is normal- 21. PCT is improved today at 2.75 (13.70 on admission). WBC today is trending up - 22.9 (previously 11.3, 18.1).  Patient has ACP documents on chart reviewed- Living Will present stating desire for natural death in the event of terminal illness. MOST form present-   Cardiopulmonary Resuscitation: Attempt Resuscitation (CPR)  Medical Interventions: Full Scope of Treatment: Use intubation, advanced airway interventions, mechanical ventilation, cardioversion as indicated, medical treatment, IV fluids, etc, also provide comfort measures. Transfer to the hospital if indicated  Antibiotics: Antibiotics if indicated  IV Fluids: IV fluids if indicated  Feeding Tube: Feeding tube for a defined trial period    Discussed case with Dr. Tobie- concerns for patient not taking lasix  as recommended. I discussed this with patient's spouse and she reports that he takes it every day.   On evaluation patient is awake. Keeps his  eyes closed but answers questions intermittently. Complained of his mouth and throat hurting- white coating on lips and tongue noted.   I met in a separate room with his spouse, June, sister Erminio and Brenda's husband.   I introduced Palliative Medicine as specialized medical care for people living with serious illness. It focuses on providing relief from the symptoms and stress of a serious illness. The goal is to improve quality of life for both the patient and the family.  We discussed a brief life review of the patient. He is retired from working for [employer]. He and his spouse have been married for almost 60 years. They are christian faith. He enjoys fishing, spending time in his yard, traveling.   As far as functional and nutritional status- prior to admission he was living independent with all ADL's with his spouse. Had good functional status except he doesn't drive due to macular degeneration. He takes oxycodone  regularly for pain control. (20mg /day)    We discussed patient's current illness and what it means in the larger context of patient's on-going co-morbidities.  Natural disease trajectory and expectations at EOL were discussed. We discussed how co-morbidities can affect his ability to recover from significant illness. June shared that  he had pnuemonia and was hospitalized last year as well and had some confusion during that hospitalization. She and Erminio note that his pain medication was stopped and his confusion worsened- appeared to clear some when he received morphine .   I attempted to elicit values and goals of care important to the patient.  Patient enjoys being very active and on the go. Goals of care include returning him to a state where he can live independently at home. He would never want to living in a nursing facility, even temporarily.   Advance directives, concepts specific to code status, artificial feeding and hydration, and rehospitalization were  considered and discussed. MOST form was reviewed- June notes that current selections are accurate. I encouraged June to continue to consider choices within the patient's stated goals of care. I discussed my concern that CPR would not restore patient to living independently if he experienced cardiac arrest and recommended consideration of DNR status. June would like to try and see if patient's mental status can clear before making any further decisions.   I shared with June that while medical team is hoping patient will improve it is important to be prepared that he could get worse and that she will have to be the decision maker.   Discussed with patient/family the importance of continued conversation with family and the medical providers regarding overall plan of care and treatment options, ensuring decisions are within the context of the patients values and GOCs.    Questions and concerns were addressed. The family was encouraged to call with questions or concerns.      SUMMARY OF RECOMMENDATIONS- -Sepsis due to pneumonia/cellulitis- continue full code, full scope interventions -Oroesophageal candidiasis- order placed for diflucan  per pharmacy  -Altered mental status- recommend delirium precautions, also concern for opioid withdrawal- recommend restarting patient's home opioid medication as he has been on this dosage for several years, high risk for withdrawal -Standard delirium management (adapted from NICE guidelines 2011 for prevention of delirium):  Provide continuity of care when possible (avoid frequent changing of surroundings and staff).  Frequent reorientation to time with:  A clock should always be visible.  Make sure Calendar/white board is updated.  Lights on/blinds open during the day and off/closed at night.  Encourage frequent family visits.  Monitor for and treat dehydration/constipation.  Optimize oxygen saturation.  Avoid catheters and IV's when possible and look for/treat  infections.  Encourage early mobility.  Assess and treat pain.  Ensure adequate nutrition and functioning dentures.  Address reversible causes of hearing and visual impairment:  Use pocket talker if hearing aids are unavailable.  Avoid sleep disturbance (normalize sleep/wake cycle).  Minimize disturbances and consider NOT obtaining vitals at night if possible.  Review Medications to avoid polypharmacy and avoid deliriogenic medications when possible:  Benzodiazepines.  Dihydropyridines.  Antihistamines.  Anticholinergics.  (Possibly avoid: H2 blockers, tricyclic antidepressants, antiparkinson medications, steroids, NSAID's).       Code Status/Advance Care Planning:   Code Status: Full Code    Prognosis:   Unable to determine  Discharge Planning: Home with Home Health  Primary Diagnoses: Present on Admission:  Sepsis (HCC)  CAP (community acquired pneumonia)  Acute hypoxic respiratory failure (HCC)  Acute kidney injury superimposed on chronic kidney disease  Essential hypertension  Chronic diastolic CHF (congestive heart failure) (HCC)  Hyperlipidemia  COPD (chronic obstructive pulmonary disease) (HCC)   Review of Systems  Unable to perform ROS: Mental status change    Physical Exam Vitals and nursing note reviewed.  Constitutional:      Appearance: He is ill-appearing.  HENT:     Mouth/Throat:     Pharynx: Oropharyngeal exudate present.  Neurological:     Mental Status: He is disoriented.     Vital Signs: BP (!) 150/67 (BP Location: Left Arm)   Pulse (!) 110   Temp 97.6 F (36.4 C) (Axillary)   Resp (!) 28   Ht 5' 6.5 (1.689 m)   Wt 100.7 kg   SpO2 94%   BMI 35.29 kg/m  Pain Scale: 0-10   Pain Score: 7    SpO2: SpO2: 94 % O2 Device:SpO2: 94 % O2 Flow Rate: .O2 Flow Rate (L/min): 2 L/min  IO: Intake/output summary:  Intake/Output Summary (Last 24 hours) at 01/23/2024 1659 Last data filed at 01/23/2024 1420 Gross per 24 hour  Intake --  Output  4550 ml  Net -4550 ml    LBM: Last BM Date : 01/22/24 Baseline Weight: Weight: 100.7 kg Most recent weight: Weight: 100.7 kg       Thank you for this consult. Palliative medicine will continue to follow and assist as needed.   Signed by: Loreta Blouch, AGNP-C Palliative Medicine   I personally spent a total of 90 minutes in the care of the patient today including preparing to see the patient, getting/reviewing separately obtained history, performing a medically appropriate exam/evaluation, counseling and educating, placing orders, referring and communicating with other health care professionals, and documenting clinical information in the EHR.   Please contact Palliative Medicine Team phone at [phone] for questions and concerns.  For individual provider: See Amion               "

## 2024-01-23 NOTE — Evaluation (Signed)
 Physical Therapy Evaluation Patient Details Name: Cody Tate MRN: [id number] DOB: 11-03-1950 Today's Date: 01/23/2024  History of Present Illness  Pt is a 74 y/o male presenting with AMS, SOB, lethargy. Admitted for severe sepsis due to PNA and cellulitis, AKI. Chest CT also showed possible L pleural effusion. PMH: arthritis, BPH, COPD, COPD, glaucoma, heart murmur, HTN, multiple spinal surgeries, L knee arthroscopy.   Clinical Impression  Pt is currently mobilizing below his baseline due to weakness, pain, and AMS. Pt requires maxAx2 for bed mobility with guarding upon sitting EOB due to pt demonstrating forward trunk lean. Pt requires modAx2 to achieve STS and maintain balance in standing. ModAx2 also required to take lateral steps towards HOB. Pt demonstrates increased difficulty mobilizing L LE when stepping towards the L, requiring increased time to coordinate steps and multimodal cues. Further ambulation not assessed at this time for patient safety due to cognition and fatigue. Pt would benefit from continued PT services focused on strength, balance, transfers, and gait to promote safety and independence with functional mobility. Rec for HHPT appropriate pending improved cognition and decreased level of support required. Wife wants to take home vs rehab.  Will continue to follow and update as appropriate.       If plan is discharge home, recommend the following: A lot of help with walking and/or transfers;A lot of help with bathing/dressing/bathroom;Assistance with cooking/housework;Direct supervision/assist for medications management;Direct supervision/assist for financial management;Assist for transportation;Help with stairs or ramp for entrance;Supervision due to cognitive status   Can travel by private vehicle        Equipment Recommendations None recommended by PT (Pt has appropriate equipment at this time)  Recommendations for Other Services       Functional Status Assessment  Patient has had a recent decline in their functional status and demonstrates the ability to make significant improvements in function in a reasonable and predictable amount of time.     Precautions / Restrictions Precautions Precautions: Fall;Other (comment) Recall of Precautions/Restrictions: Impaired Precaution/Restrictions Comments: watch O2/HR Restrictions Weight Bearing Restrictions Per Provider Order: No      Mobility  Bed Mobility Overal bed mobility: Needs Assistance Bed Mobility: Supine to Sit, Sit to Supine     Supine to sit: Max assist, +2 for physical assistance, +2 for safety/equipment Sit to supine: Max assist, +2 for physical assistance, +2 for safety/equipment   General bed mobility comments: Pt assisting some with use of bedrails but required overall Max Ax  2 to sit EOB and return to supine. step by step cueing needed, Forward shoulder posture upon sitting EOB, difficulty keeping eyes open.    Transfers Overall transfer level: Needs assistance Equipment used: Rolling walker (2 wheels) Transfers: Sit to/from Stand Sit to Stand: Mod assist, +2 physical assistance, +2 safety/equipment           General transfer comment: Mod A x2  to stand at bedside with RW, cues for hand placement and assist to correct posterior bias. Mod A x 2 for side steps along bedside with assist for RW, multimodal cues on step sequence. Greater difficulty mobilizing L LE vs R. Fatigues quickly.    Ambulation/Gait               General Gait Details: Not safe to ambulate further at this time due to cognition and level of fatigue.  Stairs            Wheelchair Mobility     Tilt Bed    Modified Rankin (Stroke Patients  Only)       Balance Overall balance assessment: Needs assistance Sitting-balance support: No upper extremity supported, Feet supported Sitting balance-Leahy Scale: Fair Sitting balance - Comments: Guarding required due to forward lean and difficulty  keeping eyes open.   Standing balance support: Bilateral upper extremity supported, During functional activity, Reliant on assistive device for balance Standing balance-Leahy Scale: Poor Standing balance comment: Reliant on walker and external support for balance.                             Pertinent Vitals/Pain Pain Assessment Pain Assessment: Faces Faces Pain Scale: Hurts a little bit Pain Location: Generalized discomfort Pain Descriptors / Indicators: Aching Pain Intervention(s): Monitored during session    Home Living Family/patient expects to be discharged to:: Private residence Living Arrangements: Spouse/significant other Available Help at Discharge: Family;Available 24 hours/day Type of Home: Mobile home Home Access: Stairs to enter Entrance Stairs-Rails: Left;Can reach both;Right Entrance Stairs-Number of Steps: 3   Home Layout: One level (reports 3 steps into kitchen) Home Equipment: Rollator (4 wheels);Shower seat - built in;Grab bars - [employer] (2 wheels);Electric scooter      Prior Function Prior Level of Function : Needs assist             Mobility Comments: Uses smaller RW to access bathroom and rollator around the house. Electric scooter outside of the house ADLs Comments: Wife assists with compression stockings, sometimes other LB dressing and bathing L side in shower d/t difficulty reaching. Reports the pt is able to dress self when she is unavailable to help. Uses shower chair     Extremity/Trunk Assessment   Upper Extremity Assessment Upper Extremity Assessment: Defer to OT evaluation    Lower Extremity Assessment Lower Extremity Assessment: Overall WFL for tasks assessed;Generalized weakness (Cognition greatest limitation at this time)    Cervical / Trunk Assessment Cervical / Trunk Assessment: Normal  Communication   Communication Communication: Impaired Factors Affecting Communication: Hearing impaired     Cognition Arousal: Alert Behavior During Therapy: Flat affect   PT - Cognitive impairments: Orientation, Awareness, Safety/Judgement, Problem solving, Sequencing, Initiation, Attention   Orientation impairments: Person                   PT - Cognition Comments: Pt inconsistently answers questions correctly. Unable to recall birthday but recalls which walkers he uses. Following commands: Impaired Following commands impaired: Follows one step commands inconsistently, Follows one step commands with increased time     Cueing Cueing Techniques: Verbal cues, Gestural cues, Tactile cues     General Comments General comments (skin integrity, edema, etc.): HR elevated with mobility. B LE edema and reddness noted, warm to touch.    Exercises     Assessment/Plan    PT Assessment Patient needs continued PT services  PT Problem List Decreased strength;Decreased mobility;Decreased safety awareness;Decreased range of motion;Decreased coordination;Decreased knowledge of precautions;Decreased activity tolerance;Decreased cognition;Cardiopulmonary status limiting activity;Decreased skin integrity;Decreased balance;Decreased knowledge of use of DME;Pain       PT Treatment Interventions DME instruction;Therapeutic exercise;Wheelchair mobility training;Gait training;Balance training;Stair training;Neuromuscular re-education;Functional mobility training;Therapeutic activities;Patient/family education;Cognitive remediation    PT Goals (Current goals can be found in the Care Plan section)  Acute Rehab PT Goals Patient Stated Goal: Go home PT Goal Formulation: With patient/family Time For Goal Achievement: 02/06/24 Potential to Achieve Goals: Good    Frequency Min 2X/week     Co-evaluation   Reason for Co-Treatment: For patient/therapist  safety;To address functional/ADL transfers;Necessary to address cognition/behavior during functional activity PT goals addressed during session:  Mobility/safety with mobility;Proper use of DME;Balance OT goals addressed during session: Proper use of Adaptive equipment and DME;Strengthening/ROM       AM-PAC PT 6 Clicks Mobility  Outcome Measure Help needed turning from your back to your side while in a flat bed without using bedrails?: A Lot Help needed moving from lying on your back to sitting on the side of a flat bed without using bedrails?: A Lot Help needed moving to and from a bed to a chair (including a wheelchair)?: A Lot Help needed standing up from a chair using your arms (e.g., wheelchair or bedside chair)?: A Lot Help needed to walk in hospital room?: Total Help needed climbing 3-5 steps with a railing? : Total 6 Click Score: 10    End of Session Equipment Utilized During Treatment: Gait belt Activity Tolerance: Patient limited by fatigue;Patient limited by lethargy Patient left: in bed;with call bell/phone within reach;with bed alarm set;with family/visitor present Nurse Communication: Mobility status PT Visit Diagnosis: Unsteadiness on feet (R26.81);Other abnormalities of gait and mobility (R26.89);Pain Pain - Right/Left: Left Pain - part of body: Leg    Time: 9056-8994 PT Time Calculation (min) (ACUTE ONLY): 22 min   Charges:   PT Evaluation $PT Eval Moderate Complexity: 1 Mod   PT General Charges $$ ACUTE PT VISIT: 1 Visit         Sabra Morel, PT, DPT  Acute Rehabilitation Services         Office: [phone]     Sabra MARLA Morel 01/23/2024, 4:05 PM

## 2024-01-24 ENCOUNTER — Inpatient Hospital Stay (HOSPITAL_COMMUNITY)

## 2024-01-24 DIAGNOSIS — A419 Sepsis, unspecified organism: Secondary | ICD-10-CM | POA: Diagnosis not present

## 2024-01-24 DIAGNOSIS — R6520 Severe sepsis without septic shock: Secondary | ICD-10-CM | POA: Diagnosis not present

## 2024-01-24 DIAGNOSIS — J9601 Acute respiratory failure with hypoxia: Secondary | ICD-10-CM | POA: Diagnosis not present

## 2024-01-24 LAB — BASIC METABOLIC PANEL WITH GFR
Anion gap: 12 (ref 5–15)
Anion gap: 11 (ref 5–15)
BUN: 28 mg/dL — ABNORMAL HIGH (ref 8–23)
BUN: 26 mg/dL — ABNORMAL HIGH (ref 8–23)
CO2: 30 mmol/L (ref 22–32)
CO2: 32 mmol/L (ref 22–32)
Calcium: 9.4 mg/dL (ref 8.9–10.3)
Calcium: 8.9 mg/dL (ref 8.9–10.3)
Chloride: 98 mmol/L (ref 98–111)
Chloride: 94 mmol/L — ABNORMAL LOW (ref 98–111)
Creatinine, Ser: 0.77 mg/dL (ref 0.61–1.24)
Creatinine, Ser: 0.69 mg/dL (ref 0.61–1.24)
GFR, Estimated: >60 mL/min
GFR, Estimated: >60 mL/min
Glucose, Bld: 90 mg/dL (ref 70–99)
Glucose, Bld: 108 mg/dL — ABNORMAL HIGH (ref 70–99)
Potassium: 2.7 mmol/L — CL (ref 3.5–5.1)
Potassium: 2.8 mmol/L — ABNORMAL LOW (ref 3.5–5.1)
Sodium: 140 mmol/L (ref 135–145)
Sodium: 138 mmol/L (ref 135–145)

## 2024-01-24 LAB — CBC WITH DIFFERENTIAL/PLATELET
Abs Immature Granulocytes: 0.49 K/uL — ABNORMAL HIGH (ref 0.00–0.07)
Basophils Absolute: 0.2 K/uL — ABNORMAL HIGH (ref 0.0–0.1)
Basophils Relative: 1 %
Eosinophils Absolute: 0 K/uL (ref 0.0–0.5)
Eosinophils Relative: 0 %
HCT: 41.1 % (ref 39.0–52.0)
Hemoglobin: 14.3 g/dL (ref 13.0–17.0)
Immature Granulocytes: 3 %
Lymphocytes Relative: 5 %
Lymphs Abs: 1 K/uL (ref 0.7–4.0)
MCH: 31 pg (ref 26.0–34.0)
MCHC: 34.8 g/dL (ref 30.0–36.0)
MCV: 89.2 fL (ref 80.0–100.0)
Monocytes Absolute: 2.3 K/uL — ABNORMAL HIGH (ref 0.1–1.0)
Monocytes Relative: 12 %
Neutro Abs: 15.6 K/uL — ABNORMAL HIGH (ref 1.7–7.7)
Neutrophils Relative %: 79 %
Platelets: 279 K/uL (ref 150–400)
RBC: 4.61 MIL/uL (ref 4.22–5.81)
RDW: 13.3 % (ref 11.5–15.5)
WBC: 19.6 K/uL — ABNORMAL HIGH (ref 4.0–10.5)
nRBC: 0 % (ref 0.0–0.2)

## 2024-01-24 LAB — MAGNESIUM
Magnesium: 1.8 mg/dL (ref 1.7–2.4)
Magnesium: 1.9 mg/dL (ref 1.7–2.4)

## 2024-01-24 LAB — PHOSPHORUS
Phosphorus: 1.8 mg/dL — ABNORMAL LOW (ref 2.5–4.6)
Phosphorus: 3 mg/dL (ref 2.5–4.6)

## 2024-01-24 MED ORDER — POTASSIUM PHOSPHATES 15 MMOLE/5ML IV SOLN
30.0000 mmol | Freq: Once | INTRAVENOUS | Status: AC
  Administered 2024-01-24: 30 mmol via INTRAVENOUS
  Filled 2024-01-24: qty 10

## 2024-01-24 MED ORDER — MAGNESIUM SULFATE 2 GM/50ML IV SOLN
2.0000 g | Freq: Once | INTRAVENOUS | Status: AC
  Administered 2024-01-24: 2 g via INTRAVENOUS
  Filled 2024-01-24: qty 50

## 2024-01-24 MED ORDER — HALOPERIDOL LACTATE 5 MG/ML IJ SOLN
2.0000 mg | Freq: Once | INTRAMUSCULAR | Status: AC
  Administered 2024-01-24: 2 mg via INTRAVENOUS
  Filled 2024-01-24: qty 1

## 2024-01-24 MED ORDER — POTASSIUM CHLORIDE 20 MEQ PO PACK
40.0000 meq | PACK | ORAL | Status: DC
  Administered 2024-01-24: 40 meq via ORAL
  Filled 2024-01-24: qty 2

## 2024-01-24 MED ORDER — POTASSIUM CHLORIDE 10 MEQ/100ML IV SOLN
10.0000 meq | INTRAVENOUS | Status: AC
  Administered 2024-01-24 (×3): 10 meq via INTRAVENOUS
  Filled 2024-01-24 (×4): qty 100

## 2024-01-24 MED ORDER — SODIUM CHLORIDE 0.9 % IV SOLN
3.0000 g | Freq: Four times a day (QID) | INTRAVENOUS | Status: DC
  Administered 2024-01-24 – 2024-01-26 (×8): 3 g via INTRAVENOUS
  Filled 2024-01-24 (×8): qty 8

## 2024-01-24 MED ORDER — OXYCODONE HCL 5 MG PO TABS
10.0000 mg | ORAL_TABLET | Freq: Four times a day (QID) | ORAL | Status: DC
  Administered 2024-01-24 – 2024-01-27 (×12): 10 mg via ORAL
  Filled 2024-01-24 (×12): qty 2

## 2024-01-24 MED ORDER — DOXYCYCLINE HYCLATE 100 MG PO TABS
100.0000 mg | ORAL_TABLET | Freq: Two times a day (BID) | ORAL | Status: DC
  Administered 2024-01-24 – 2024-01-25 (×4): 100 mg via ORAL
  Filled 2024-01-24 (×4): qty 1

## 2024-01-24 MED ORDER — OXYCODONE HCL 5 MG PO TABS
10.0000 mg | ORAL_TABLET | Freq: Two times a day (BID) | ORAL | Status: DC | PRN
  Administered 2024-01-25 – 2024-01-26 (×2): 10 mg via ORAL
  Filled 2024-01-24 (×2): qty 2

## 2024-01-24 MED ORDER — POTASSIUM CHLORIDE 10 MEQ/100ML IV SOLN
10.0000 meq | Freq: Once | INTRAVENOUS | Status: AC
  Administered 2024-01-24: 10 meq via INTRAVENOUS

## 2024-01-24 NOTE — Progress Notes (Signed)
 Physical Therapy Treatment Patient Details Name: Cody Tate MRN: [id number] DOB: 05-05-1950 Today's Date: 01/24/2024   History of Present Illness Pt is a 74 y.o. male s/p Anterior lumbar interbody fusion L 5-S1 with cage. PMH significant for arthritis, BPH, COPD, COPD, glaucoma, heart murmur, HTN, multiple spinal surgeries, L knee arthroscopy.    PT Comments  Pt continues to be limited in mobility due to pain and AMS. Pt demonstrates difficulty following commands and is unable to sequence transfers without significant assist. Pt requires maxA for bed mobility due to B LE pain and is unable to reposition self while EOB. Unable to achieve stance with maxA from standard bed height, but is able to complete stance with maxA from elevated surface. Pt demonstrates L>R difficulty coordinating steps to recliner despite multimodal cues. Ultimately, recliner had to be positioned behind pt for safety. Pt would benefit from continued PT services focused on strength, balance, transfers, and gait to promote safety and independence with functional mobility.   Rec for HHPT appropriate pending improved cognition and decreased level of support required. Wife wants to take home vs rehab. If mobility does not improve with improvements in cognition, pt will require <3hr rehab prior to safe return home. Will continue to follow and update as appropriate.    If plan is discharge home, recommend the following: Two people to help with walking and/or transfers;Two people to help with bathing/dressing/bathroom;Assistance with cooking/housework;Direct supervision/assist for medications management;Direct supervision/assist for financial management;Assist for transportation;Help with stairs or ramp for entrance;Supervision due to cognitive status   Can travel by private vehicle        Equipment Recommendations  Rolling walker (2 wheels);BSC/3in1;Wheelchair (measurements PT);Wheelchair cushion (measurements PT)     Recommendations for Other Services       Precautions / Restrictions Precautions Precautions: Fall;Other (comment) Recall of Precautions/Restrictions: Impaired Precaution/Restrictions Comments: watch O2/HR Restrictions Weight Bearing Restrictions Per Provider Order: No     Mobility  Bed Mobility Overal bed mobility: Needs Assistance Bed Mobility: Supine to Sit     Supine to sit: Max assist Sit to supine: Max assist   General bed mobility comments: Pt with increased pain when attempting to mobilize B LE towards EOB. MaxA for LE and trunk management to sit EOB. Pt unable to reposition self, bed pads used for repositioning.    Transfers Overall transfer level: Needs assistance Equipment used: Rolling walker (2 wheels) Transfers: Sit to/from Stand, Bed to chair/wheelchair/BSC Sit to Stand: Max assist, +2 safety/equipment   Step pivot transfers: Max assist, +2 safety/equipment       General transfer comment: Pt with difficulty following cues to sequence steps over to the chair. L>R difficulty clearing foot to take step. Wife present and blocking walker and chair to assist with patient safety, pt unable to use device safely at this time. Poor eccentric control when returning to a seated position. Posterior lean noted throughout.    Ambulation/Gait               General Gait Details: Not safe to ambulate further at this time due to cognition and poor command following.   Stairs             Wheelchair Mobility     Tilt Bed    Modified Rankin (Stroke Patients Only)       Balance Overall balance assessment: Needs assistance Sitting-balance support: No upper extremity supported, Feet supported Sitting balance-Leahy Scale: Fair Sitting balance - Comments: Improved seated balance EOB, guarding still needed due to  pt being restless and repositioning frequently. Postural control: Posterior lean Standing balance support: Bilateral upper extremity supported,  During functional activity, Reliant on assistive device for balance Standing balance-Leahy Scale: Poor Standing balance comment: Reliant on walker and external support for balance. Posterior lean throughout. Unable to coordinate taking a step when weight shift outside BOS.                            Communication Communication Communication: Impaired Factors Affecting Communication: Hearing impaired;Difficulty expressing self  Cognition Arousal: Alert Behavior During Therapy: Flat affect   PT - Cognitive impairments: Orientation, Awareness, Safety/Judgement, Problem solving, Sequencing, Initiation, Attention   Orientation impairments: Person                   PT - Cognition Comments: Repeatedly agrees with statements and questions, whether appropriate to the topic or not. More alert, doing better keeping eyes open. Not following commands correctly. Following commands: Impaired Following commands impaired: Follows one step commands inconsistently, Follows one step commands with increased time    Cueing Cueing Techniques: Verbal cues, Gestural cues, Tactile cues, Visual cues  Exercises      General Comments General comments (skin integrity, edema, etc.): SpO2 82% on RA, improved to 87% on 4L. RN notified of changes in O2 with activity. Pt SOB with activity but otherwise asymptomatic.  B LE red, swollen, warm to touch. No new skin abnormalities noted.      Pertinent Vitals/Pain Pain Assessment Pain Assessment: Faces Faces Pain Scale: Hurts even more Pain Location: Generalized movement (Pt reports pain is difficult to describe, noted to occur with movement of B LE) Pain Descriptors / Indicators: Grimacing, Guarding, Moaning Pain Intervention(s): Limited activity within patient's tolerance, Repositioned, Monitored during session    Home Living                          Prior Function            PT Goals (current goals can now be found in the care  plan section) Acute Rehab PT Goals Patient Stated Goal: Go home PT Goal Formulation: With patient/family Time For Goal Achievement: 02/06/24 Potential to Achieve Goals: Good Progress towards PT goals: Progressing toward goals    Frequency    Min 2X/week      PT Plan      Co-evaluation              AM-PAC PT 6 Clicks Mobility   Outcome Measure  Help needed turning from your back to your side while in a flat bed without using bedrails?: A Lot Help needed moving from lying on your back to sitting on the side of a flat bed without using bedrails?: A Lot Help needed moving to and from a bed to a chair (including a wheelchair)?: A Lot Help needed standing up from a chair using your arms (e.g., wheelchair or bedside chair)?: A Lot Help needed to walk in hospital room?: Total Help needed climbing 3-5 steps with a railing? : Total 6 Click Score: 10    End of Session Equipment Utilized During Treatment: Gait belt;Oxygen Activity Tolerance: Patient limited by fatigue;Patient limited by pain (Patient limited by SOB) Patient left: in chair;with call bell/phone within reach;with chair alarm set;with family/visitor present Nurse Communication: Mobility status;Need for lift equipment PT Visit Diagnosis: Unsteadiness on feet (R26.81);Muscle weakness (generalized) (M62.81) Pain - Right/Left:  (Bilateral) Pain - part of  body: Leg     Time: 9091-9054 PT Time Calculation (min) (ACUTE ONLY): 37 min  Charges:    $Gait Training: 8-22 mins $Therapeutic Activity: 8-22 mins PT General Charges $$ ACUTE PT VISIT: 1 Visit                     Cody Tate, PT, DPT  Acute Rehabilitation Services         Office: [phone]      Cody Tate 01/24/2024, 12:54 PM

## 2024-01-24 NOTE — Evaluation (Signed)
 Clinical/Bedside Swallow Evaluation Patient Details  Name: Cody Tate MRN: [id number] Date of Birth: 29-Oct-1950  Today's Date: 01/24/2024 Time: SLP Start Time (ACUTE ONLY): 0941 SLP Stop Time (ACUTE ONLY): 1004 SLP Time Calculation (min) (ACUTE ONLY): 23 min  Past Medical History:  Past Medical History:  Diagnosis Date   Acute heart failure with preserved ejection fraction (HCC) 11/12/2022   Arthritis    Back pain    Radiates down both legs   BPH (benign prostatic hyperplasia)    Cancer (HCC) 2022   prostate cancer   COPD (chronic obstructive pulmonary disease) (HCC)    Dyspnea    Enlarged prostate    GERD (gastroesophageal reflux disease)    Glaucoma    H/O hiatal hernia    H/O wheezing    occ inhaler usage   Heart murmur    Hypertension    Lumbar foraminal stenosis    Macular degeneration    Meningioma (HCC) 2014   Treated with radiation   Neuromuscular disorder (HCC)    Neuropathy bilateral legs   PONV (postoperative nausea and vomiting)    Pulmonary nodules    resolved on 2019 follow up CT   S/P insertion of spinal cord stimulator    Past Surgical History:  Past Surgical History:  Procedure Laterality Date   ABDOMINAL EXPOSURE N/A 03/16/2022   Procedure: ABDOMINAL EXPOSURE;  Surgeon: Gretta Lonni PARAS, MD;  Location: [facility];  Service: Vascular;  Laterality: N/A;   ANTERIOR LUMBAR FUSION N/A 03/16/2022   Procedure: Anterior Lumbar Interbody Fusion - Lumbar Five-Sacral One with cage;  Surgeon: Louis Shove, MD;  Location: [facility] OR;  Service: Neurosurgery;  Laterality: N/A;   BACK SURGERY  06/08/2006   lam x3 cerv x 1   CERVICAL FUSION     COLONOSCOPY  2020   CYSTOSCOPY     74 yrs old   CYSTOSCOPY WITH URETHRAL DILATATION N/A 09/21/2021   Procedure: CYSTOSCOPY WITH BALLOON DILATION and OPTILUME BALLOON DILATION OF  URETHRAL DILATATION;  Surgeon: Elisabeth Valli BIRCH, MD;  Location: [facility] ORS;  Service: Urology;  Laterality: N/A;   EYE SURGERY     bil   GOLD SEED  IMPLANT N/A 09/06/2020   Procedure: GOLD SEED IMPLANT/ PLACEMENT OF FIDUCIAL MARKERS;  Surgeon: Elisabeth Valli BIRCH, MD;  Location: [facility] ORS;  Service: Urology;  Laterality: N/A;   KNEE ARTHROSCOPY  01/02/2008   lft   KNEE ARTHROSCOPY Left 07/23/2012   Procedure: LEFT MEDIAL MENISCAL DEBRIDEMENT AND CHONDROPLASTY;  Surgeon: Dempsey LULLA Moan, MD;  Location: [facility] ORS;  Service: Orthopedics;  Laterality: Left;   LUMBAR FUSION  02/22/2014   DR POOL   PILONIDAL CYST EXCISION     SINUS EXPLORATION     SPACE OAR INSTILLATION N/A 09/06/2020   Procedure: SPACE OAR INSTILLATION;  Surgeon: Elisabeth Valli BIRCH, MD;  Location: [facility] ORS;  Service: Urology;  Laterality: N/A;   SPINAL CORD STIMULATOR INSERTION N/A 01/16/2018   Procedure: LUMBAR SPINAL CORD STIMULATOR INSERTION;  Surgeon: Joshua Alm RAMAN, MD;  Location: [facility] OR;  Service: Neurosurgery;  Laterality: N/A;  LUMBAR SPINAL CORD STIMULATOR INSERTION   TRANSURETHRAL RESECTION OF PROSTATE N/A 09/06/2020   Procedure: TRANSURETHRAL RESECTION OF THE PROSTATE (TURP);  Surgeon: Elisabeth Valli BIRCH, MD;  Location: [facility] ORS;  Service: Urology;  Laterality: N/A;  2 HRS   WRIST GANGLION EXCISION     rt   WRIST SURGERY  01/02/2007   cyst lft   HPI:  Pt is a 74 y/o male presenting with AMS,  SOB, lethargy. Admitted for severe sepsis due to PNA and cellulitis, AKI. Chest CT also showed possible L pleural effusion. BSE 06/2023 no s/s asspiration, GERD, reg/thin. PMH: arthritis, BPH, COPD, COPD, wifre reports neck surgery years ago glaucoma, heart murmur, HTN, multiple spinal surgeries, L knee arthroscopy. Palliative following. Memory issues per wife.    Assessment / Plan / Recommendation  Clinical Impression  Pt presents with indications of pharyngeal dysphagia with immediate cough with thin, delayed throat clear and throat clear following puree. Partials donned. WIfe reports difficulty swallowing grits recently. Memory impairments per wife. Given clincial observations, wife  report and pna, will proceed with MBS today at 2:00. Hold lunch but can have ice chips and meds with puree. SLP Visit Diagnosis: Dysphagia, unspecified (R13.10)    Aspiration Risk  Moderate aspiration risk    Diet Recommendation           Other Recommendations       Swallow Evaluation Recommendations Recommendations: Ice chips PRN after oral care (hold lunch)   Assistance Recommended at Discharge    Functional Status Assessment    Frequency and Duration            Prognosis Prognosis for improved oropharyngeal function:  (fair-good) Barriers to Reach Goals: Cognitive deficits      Swallow Study   General HPI: Pt is a 74 y/o male presenting with AMS, SOB, lethargy. Admitted for severe sepsis due to PNA and cellulitis, AKI. Chest CT also showed possible L pleural effusion. BSE 06/2023 no s/s asspiration, GERD, reg/thin. PMH: arthritis, BPH, COPD, COPD, wifre reports neck surgery years ago glaucoma, heart murmur, HTN, multiple spinal surgeries, L knee arthroscopy. Palliative following. Memory issues per wife. Type of Study: Bedside Swallow Evaluation Previous Swallow Assessment:  (see HPI) Diet Prior to this Study: Regular;Thin liquids (Level 0) Temperature Spikes Noted: No Respiratory Status: Room air History of Recent Intubation: No Behavior/Cognition: Alert;Cooperative;Pleasant mood;Requires cueing Oral Cavity Assessment:  (lingual candidias?) Oral Care Completed by SLP: No Oral Cavity - Dentition:  (upper and loer partial) Vision:  (kept eyes closed) Self-Feeding Abilities:  (TBA- held cup) Patient Positioning: Upright in chair Baseline Vocal Quality: Normal Volitional Cough: Strong    Oral/Motor/Sensory Function Overall Oral Motor/Sensory Function: Within functional limits   Ice Chips Ice chips: Not tested   Thin Liquid Thin Liquid: Impaired Presentation: Cup Pharyngeal  Phase Impairments: Cough - Immediate;Throat Clearing - Delayed    Nectar Thick Nectar Thick  Liquid: Not tested   Honey Thick Honey Thick Liquid: Not tested   Puree Puree: Impaired Presentation: Spoon Pharyngeal Phase Impairments: Cough - Immediate   Solid     Solid: Not tested      [name] 01/24/2024,10:21 AM

## 2024-01-24 NOTE — Progress Notes (Signed)
 "                                                                                                                                                                                                          Daily Progress Note   [name]       Date: 01/24/2024 DOB: 1950-10-02  Age: 74 y.o. MRN#: [id number] Attending Physician: Tobie Yetta HERO, MD Primary Care Physician: Charlott Dorn LABOR, MD Admit Date: 01/20/2024  Reason for Follow-up: Establishing goals of care  Patient Profile/HPI:   74 y.o. male  with past medical history of hepatic cirrhosis- MELD 9, COPD, CHF, chronic pain, HLD, lower extremity edema  admitted on 01/20/2024 with complaints of SOB and lethargy. Workup revealed sepsis due to pneumonia and lower extremity cellulitis. He has had some confusion during admission. Palliative medicine consulted for GOC.    Discussion: Chart reviewed. CBC 1/23 shows WBC trending down- 19.6. BMP with stable Cr. 0.77. Patient transferring from bed to chair with walker, following nursing commands. Discussed with SLP- plan for MBS today.  Spoke with spouse and sister. Patient with improving mental status today but still a little confused. Repeating the word here over and over.  Reviewed patient's lab results with them per their request. Discussed role of WBC in infection pathology.  They are aware of swallow study scheduled today. June does not think he has had swallowing issues in the past.  They continue to hope for improvements with goal of patient returning home.   Review of Systems  Unable to perform ROS: Mental status change     Physical Exam Vitals and nursing note reviewed.  Cardiovascular:     Rate and Rhythm: Normal rate.             Vital Signs: BP (!) 142/72 (BP Location: Left Arm)   Pulse 94   Temp 98.2 F (36.8 C) (Oral)   Resp (!) 24   Ht 5' 6.5 (1.689 m)   Wt 100.7 kg   SpO2 90%   BMI 35.29 kg/m  SpO2: SpO2: 90 % O2 Device: O2 Device: Room  Air O2 Flow Rate: O2 Flow Rate (L/min): 2 L/min  Intake/output summary:  Intake/Output Summary (Last 24 hours) at 01/24/2024 1258 Last data filed at 01/24/2024 0815 Gross per 24 hour  Intake --  Output 3500 ml  Net -3500 ml   LBM: Last BM Date : 01/24/24 Baseline Weight: Weight: 100.7 kg Most recent weight: Weight: 100.7 kg       Palliative Assessment/Data: PPS: 50%  Patient Active Problem List   Diagnosis Date Noted   Cellulitis of left lower extremity 01/20/2024   Leukopenia 01/20/2024   Neutropenia 01/20/2024   Glaucoma 09/18/2023   Exudative age-related macular degeneration, bilateral, with active choroidal neovascularization (HCC) 09/18/2023   Atherosclerosis of aorta 09/18/2023   Acquired hypocalciuric hypercalcemia 09/18/2023   History of colonic polyps 09/18/2023   Iron deficiency 09/18/2023   Intestinal malabsorption 09/18/2023   Morbid obesity (HCC) 09/18/2023   Venous insufficiency of lower extremity 09/18/2023   Chronic diastolic CHF (congestive heart failure) (HCC) 06/28/2023   Pneumonia of both lower lobes due to infectious organism 06/25/2023   Acute kidney injury superimposed on chronic kidney disease 06/25/2023   Opioid-induced constipation 05/26/2023   Empyema (HCC) 05/24/2023   Parapneumonic effusion 05/23/2023   Acute hypoxic respiratory failure (HCC) 05/23/2023   Acute hyponatremia 05/23/2023   Hypokalemia 05/23/2023   Sepsis (HCC) 05/22/2023   CAP (community acquired pneumonia) 05/02/2023   Hemoptysis 05/02/2023   Mild mitral regurgitation 10/30/2022   Aortic valve stenosis 10/30/2022   COPD (chronic obstructive pulmonary disease) (HCC) 01/11/2022   GERD without esophagitis 01/11/2022   Hyperlipidemia 01/11/2022   Essential hypertension 01/11/2022   Obesity, Class II, BMI 35-39.9 01/11/2022   Other chronic pain 01/11/2022   Pulmonary nodule 01/11/2022   Lumbar foraminal stenosis 01/11/2022   Aortic ectasia 01/11/2022   BPH with  obstruction/lower urinary tract symptoms 09/06/2020   Malignant neoplasm of prostate (HCC) 07/12/2020   S/P insertion of spinal cord stimulator 01/16/2018   Back pain with history of spinal surgery 09/09/2017   Inflammation of sacroiliac joint 09/20/2016   Opioid dependence (HCC) 01/10/2016   Swelling of lower limb 03/25/2014   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   Complains of low back pain 09/09/2013   Lumbosacral radiculitis 12/03/2012   Spinal stenosis of lumbar region 10/30/2012   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   Lumbosacral spondylosis without myelopathy 10/23/2012   Dizziness 06/25/2012   Backache 06/25/2012   Benign neoplasm of cerebral meninges (HCC) 06/25/2012   Neck pain 06/25/2012   Lumbar stenosis with neurogenic claudication 05/08/2011    Palliative Care Assessment & Plan    Assessment/Recommendations/Plan  Sepsis due to pneumonia and cellulitis- altered mental status- esophopharyngeal candidiasis- improving today- continue current treatments with goal of care to return home with spouse   Code Status:   Code Status: Full Code   Prognosis:  Unable to determine  Discharge Planning: To Be Determined  Care plan was discussed with spouse and patient's sister.   Thank you for allowing the Palliative Medicine Team to assist in the care of this patient.  I personally spent a total of 45 minutes in the care of the patient today including preparing to see the patient, getting/reviewing separately obtained history, performing a medically appropriate exam/evaluation, counseling and educating, referring and communicating with other health care professionals, documenting clinical information in the EHR, independently interpreting results, and communicating results.   Cassondra Stain, AGNP-C Palliative Medicine   Please contact Palliative Medicine Team phone at [phone] for questions and concerns.        "

## 2024-01-24 NOTE — Evaluation (Signed)
 Modified Barium Swallow Study  Patient Details  Name: Cody Tate MRN: [id number] Date of Birth: October 12, 1950  Today's Date: 01/24/2024  Modified Barium Swallow completed.  Full report located under Chart Review in the Imaging Section.  History of Present Illness Pt is a 74 y/o male presenting with AMS, SOB, lethargy. Admitted for severe sepsis due to PNA and cellulitis, AKI. Chest CT also showed possible L pleural effusion. BSE 06/2023 no s/s asspiration, GERD, reg/thin. PMH: arthritis, BPH, COPD, COPD, wife reports ACDF years ago glaucoma, heart murmur, HTN, multiple spinal surgeries, L knee arthroscopy. Palliative following. Wife reports regurgitation after liquids at times and bending over following po's. Recent EDG- unable to locate specific docuumentation.   Clinical Impression  Pt exhibits a suspected primary esophageal dysphagia and minimal oral deficits with thicker textures. Recommend regular texture, thin liquids, pills with thin, esophageal precautions and follow up with GI after hospitalization.  Delayed and disorganized transit with puree. Pharyngeal phase was functional without aspiration and normal flash penetration. Trace barium lined aryepiglottic fold at times appearing as penetration and calcification on anterior vestibular wall. He has a spontaneous neck flexion/tuck possibly to facilitate pharyngeal transit due to suspected hyperlordosis. Esophageal scan revealed barium pill stopping mid esophagus not propelled by sips barium or puree with retrograde movement of puree. This is likely causing pt's intermittent coughing episodes with po's. Esophageal precautions educated with pt and wife and recommend follow up with GI after hospitalization. Factors that may increase risk of adverse event in presence of aspiration Cody Tate & Cody Tate 2021):    Swallow Evaluation Recommendations Recommendations: PO diet PO Diet Recommendation: Regular;Thin liquids (Level 0) Liquid  Administration via: Straw;Cup Medication Administration: Whole meds with liquid Supervision: Patient able to self-feed Postural changes: Position pt fully upright for meals;Stay upright 30-60 min after meals Oral care recommendations: Oral care BID (2x/day) Recommended consults: Consider GI consultation      Cody Tate Cody Tate 01/24/2024,5:18 PM

## 2024-01-24 NOTE — Plan of Care (Signed)

## 2024-01-24 NOTE — Progress Notes (Signed)
 PRN BIPAP not indicated at this time. Patient on room air, no BIPAP at bedside.

## 2024-01-24 NOTE — TOC Progression Note (Signed)
 Transition of Care [facility]) - Progression Note    Patient Details  Name: Cody Tate MRN: [id number] Date of Birth: August 31, 1950  [facility] of [facility]) CM/SW Contact  Landry DELENA Senters, RN Phone Number: 01/24/2024, 1:03 PM  Clinical Narrative:     Continued medical workup.   CM will continue to follow.    Expected Discharge Plan:  (TBD) Barriers to Discharge: Continued Medical Work up               Expected Discharge Plan and Services       Living arrangements for the past 2 months: Single Family Home                                       Social Drivers of Health (SDOH) Interventions SDOH Screenings   Food Insecurity: No Food Insecurity (01/21/2024)  Housing: Low Risk (01/21/2024)  Transportation Needs: No Transportation Needs (01/21/2024)  Utilities: Not At Risk (01/21/2024)  Social Connections: Moderately Integrated (01/21/2024)  Tobacco Use: Medium Risk (01/20/2024)    Readmission Risk Interventions    06/28/2023    3:10 PM  Readmission Risk Prevention Plan  Transportation Screening Complete  PCP or Specialist Appt within 5-7 Days Complete  Home Care Screening Complete  Medication Review (RN CM) Complete

## 2024-01-24 NOTE — Progress Notes (Signed)
 [facility] Progress Note Patient: Cody Tate FMW:[id number] DOB: [birth year]  DOA: 01/20/2024 DOS: the patient was seen and examined on 01/24/2024  Brief Summary: Patient with PMH of COPD, HTN, chronic HFpEF, CAD, liver cirrhosis, neuropathy, GERD, chronic pain, HLD, chronic edema present to the hospital with complaints of shortness of breath cough lethargy as well as boil on the left leg for 1 week with confusion. Was found to have severe sepsis due to cellulitis.  X-ray also shows possibility of pneumonia.  Viral test were negative.   Significant events: 1/19 admitted to the hospital.  CT PE protocol negative for PE. 1/21 lower extremity Doppler negative for DVT.  ABG shows significant hypoxia. 1/22 echocardiogram shows EF more than 75%, no significant valvular abnormality. CT head unremarkable for any acute abnormality.  CT abdomen and pelvis unremarkable for acute abnormality.  Shows some constipation.  Consults: Palliative care    Assessment and Plan: Severe sepsis due to combination of pneumonia as well as cellulitis. Primary reason for presentation is boil on his left inner thigh. CT scan shows evidence of significant subcutaneous tissue without any gas formation. No evidence of bony involvement. Blood cultures so far negative. Started on IV vancomycin  and cefepime .  Switching to doxycycline  and Unasyn . Blood cultures so far negative. Prior history of Pseudomonas, although from chart review appears that this was considered not an infectious cause of the presentation.   Acute hypoxic respiratory failure secondary to left lobar community-acquired pneumonia. At baseline uses 2 LPM. Required 10 LPM on 1/21. VBG shows evidence of hypoxia. Treated with IV antibiotic as well as IV Lasix . BiPAP as needed.  Currently on room air.   Acute on chronic HFpEF. HTN. On Lasix  at home. Prior echocardiogram shows EF of 70-75%. Current echocardiogram unchanged. Significantly volume  overloaded, responding well to IV Lasix  will continue IV Lasix  for now. BiPAP as needed.   Chronic pain syndrome. Chronically on pain medication 10 mg every 4 hours of oxycodone . Patient presented with significant confusion and respiratory distress and therefore medication was switched to as needed. At present there is concern for opioid withdrawal and patient appears to be responding well to opiate medication. Will increase oxycodone  back to home regimen dose at lower frequency of every 6 hours inserted every 4 hours.   BPH. Has been on Flomax . Held for now.   Chronic constipation. Appears to have resolved. Will continue aggressive bowel regimen.   COPD. Appears to be in respiratory distress although does not appear to have any significant COPD exacerbation. Due to his respiratory distress I will switch him from Breztri  to Brovana  Pulmicort  Yupelri  combination.   Acute metabolic encephalopathy. Combination of hypoxia as well as sepsis.  Also possible opioid withdrawal. Monitor for improvement. Medication adjusted.   AKI on CKD stage II. Baseline creatinine 0.7. Creatinine on admission 1.65. Currently improving back to 0.7. Monitor while receiving IV Lasix .   History of spinal cord stimulator. Monitor for now.   Bilateral lower extremity edema. Appears to be chronic. Patient is noncompliant with Lasix  and Aldactone  at home. Lower extremity Doppler negative for DVT.  Monitor.   HLD. On Lipitor. CK level normal.   GERD. Continue PPI.   Hypophosphatemia. Replacing with IV sodium phosphate . Monitor.   Obesity class II. Body mass index is 35.29 kg/m.  Placing the patient at high risk of poor outcome.   Goals of care Discussed with wife at bedside.  Patient reportedly has a living will.  Per outpatient MOST form patient would like  CPR and full aggressive measures. Palliative care was also consulted.   Poor p.o. intake. Family reports poor p.o. intake  concerns. Likely in the setting of severe critical illness. For now we will monitor.   Hypokalemia. Hypomagnesemia. Hypophosphatemia. Continue aggressive replacement. Also recheck.  Oral thrush. Initiating on Diflucan .     Code Status: Full Code   DVT Prophylaxis: SCDs Start: 01/20/24 1927 Place TED hose Start: 01/20/24 1927   Data review I have Reviewed nursing notes, Vitals, and Lab results. Since last encounter, pertinent lab results CBC and BMP   . I have ordered test including CBC BMP  .   Physical exam. Vitals:   01/24/24 0701 01/24/24 0838 01/24/24 1200 01/24/24 1600  BP: (!) 152/79  (!) 142/72 (!) 138/56  Pulse: 99  94 85  Resp: (!) 22  (!) 24 (!) 25  Temp: (!) 97.5 F (36.4 C)  98.2 F (36.8 C) (!) 97.5 F (36.4 C)  TempSrc: Axillary  Oral Oral  SpO2: 91% 96% 90% 96%  Weight:      Height:        General: in Mild distress, No Rash Cardiovascular: S1 and S2 Present, No Murmur Respiratory: Good respiratory effort, Bilateral Air entry present.  Faint basal crackles, No wheezes Abdomen: Bowel Sound present, No tenderness Extremities: Improving bilateral edema Neuro: Alert and oriented x3, no new focal deficit, no asterixis.  Able to follow commands.  Subjective: Denies any acute complaint.  No nausea no vomiting.  Pain reports horrible although unable to quantify it further.  No nausea or vomiting.  Family Communication: Family at bedside.  Disposition Plan: Status is: Inpatient Remains inpatient appropriate because: Monitor for improvement in mentation and respiratory status   Planned Discharge Destination:Skilled nursing facility Diet: Diet Order             DIET DYS 3 Room service appropriate? Yes; Fluid consistency: Thin  Diet effective now                   MEDICATIONS: Scheduled Meds:  arformoterol   15 mcg Nebulization BID   vitamin C   1,000 mg Oral Daily   budesonide  (PULMICORT ) nebulizer solution  0.25 mg Nebulization BID    Chlorhexidine  Gluconate Cloth  6 each Topical Daily   doxycycline   100 mg Oral Q12H   enoxaparin  (LOVENOX ) injection  50 mg Subcutaneous Q24H   fluconazole   100 mg Oral Daily   fluticasone   1 spray Each Nare BID   furosemide   40 mg Intravenous BID   oxyCODONE   10 mg Oral Q6H   pantoprazole   40 mg Oral Daily   revefenacin   175 mcg Nebulization Daily   rosuvastatin   20 mg Oral QHS   senna-docusate  1 tablet Oral BID   timolol   1 drop Both Eyes QHS   Continuous Infusions:  ampicillin -sulbactam (UNASYN ) IV 3 g (01/24/24 1803)   PRN Meds:.acetaminophen  **OR** acetaminophen , ipratropium-albuterol , ondansetron  **OR** ondansetron  (ZOFRAN ) IV, oxyCODONE , simethicone , sodium chloride  flush  Author: Yetta Blanch, MD  [facility] 01/24/2024  6:19 PM Between 7PM-7AM, please contact night-coverage, check [email or web address] for on call.

## 2024-01-24 NOTE — Care Management Important Message (Signed)
 Important Message  Patient Details  Name: Cody Tate MRN: [id number] Date of Birth: Jul 29, 1950   Important Message Given:  Yes - Medicare IM     [name] 01/24/2024, 4:34 PM

## 2024-01-25 DIAGNOSIS — R6520 Severe sepsis without septic shock: Secondary | ICD-10-CM | POA: Diagnosis not present

## 2024-01-25 DIAGNOSIS — J9601 Acute respiratory failure with hypoxia: Secondary | ICD-10-CM | POA: Diagnosis not present

## 2024-01-25 DIAGNOSIS — A419 Sepsis, unspecified organism: Secondary | ICD-10-CM | POA: Diagnosis not present

## 2024-01-25 LAB — BASIC METABOLIC PANEL WITH GFR
Anion gap: 12 (ref 5–15)
BUN: 25 mg/dL — ABNORMAL HIGH (ref 8–23)
CO2: 32 mmol/L (ref 22–32)
Calcium: 8.5 mg/dL — ABNORMAL LOW (ref 8.9–10.3)
Chloride: 96 mmol/L — ABNORMAL LOW (ref 98–111)
Creatinine, Ser: 0.68 mg/dL (ref 0.61–1.24)
GFR, Estimated: >60 mL/min
Glucose, Bld: 93 mg/dL (ref 70–99)
Potassium: 2.7 mmol/L — CL (ref 3.5–5.1)
Sodium: 140 mmol/L (ref 135–145)

## 2024-01-25 LAB — CBC WITH DIFFERENTIAL/PLATELET
Abs Immature Granulocytes: 0.68 10*3/uL — ABNORMAL HIGH (ref 0.00–0.07)
Basophils Absolute: 0.1 10*3/uL (ref 0.0–0.1)
Basophils Relative: 1 %
Eosinophils Absolute: 0.2 10*3/uL (ref 0.0–0.5)
Eosinophils Relative: 2 %
HCT: 41.6 % (ref 39.0–52.0)
Hemoglobin: 14.2 g/dL (ref 13.0–17.0)
Immature Granulocytes: 5 %
Lymphocytes Relative: 7 %
Lymphs Abs: 0.9 10*3/uL (ref 0.7–4.0)
MCH: 30.7 pg (ref 26.0–34.0)
MCHC: 34.1 g/dL (ref 30.0–36.0)
MCV: 90 fL (ref 80.0–100.0)
Monocytes Absolute: 2.2 10*3/uL — ABNORMAL HIGH (ref 0.1–1.0)
Monocytes Relative: 17 %
Neutro Abs: 8.4 10*3/uL — ABNORMAL HIGH (ref 1.7–7.7)
Neutrophils Relative %: 68 %
Platelets: 240 10*3/uL (ref 150–400)
RBC: 4.62 MIL/uL (ref 4.22–5.81)
RDW: 13.4 % (ref 11.5–15.5)
WBC: 12.6 10*3/uL — ABNORMAL HIGH (ref 4.0–10.5)
nRBC: 0.2 % (ref 0.0–0.2)

## 2024-01-25 LAB — CULTURE, BLOOD (ROUTINE X 2)
Culture: NO GROWTH
Culture: NO GROWTH
Special Requests: ADEQUATE

## 2024-01-25 LAB — C-REACTIVE PROTEIN: CRP: 6.6 mg/dL — ABNORMAL HIGH

## 2024-01-25 LAB — MAGNESIUM: Magnesium: 1.7 mg/dL (ref 1.7–2.4)

## 2024-01-25 MED ORDER — POTASSIUM CHLORIDE CRYS ER 20 MEQ PO TBCR
40.0000 meq | EXTENDED_RELEASE_TABLET | ORAL | Status: AC
  Administered 2024-01-25 (×2): 40 meq via ORAL
  Filled 2024-01-25 (×2): qty 2

## 2024-01-25 MED ORDER — POTASSIUM CHLORIDE CRYS ER 20 MEQ PO TBCR
40.0000 meq | EXTENDED_RELEASE_TABLET | Freq: Once | ORAL | Status: AC
  Administered 2024-01-25: 40 meq via ORAL
  Filled 2024-01-25: qty 2

## 2024-01-25 MED ORDER — FUROSEMIDE 40 MG PO TABS
40.0000 mg | ORAL_TABLET | Freq: Two times a day (BID) | ORAL | Status: DC
  Administered 2024-01-25 – 2024-01-26 (×2): 40 mg via ORAL
  Filled 2024-01-25 (×2): qty 1

## 2024-01-25 MED ORDER — POTASSIUM CHLORIDE 10 MEQ/100ML IV SOLN
10.0000 meq | INTRAVENOUS | Status: AC
  Administered 2024-01-25 (×4): 10 meq via INTRAVENOUS
  Filled 2024-01-25 (×4): qty 100

## 2024-01-25 NOTE — Progress Notes (Signed)
 Physical Therapy Treatment Patient Details Name: Cody Tate MRN: [id number] DOB: [birth year] Today's Date: 01/25/2024   History of Present Illness Pt is a 74 y.o. male s/p Anterior lumbar interbody fusion L 5-S1 with cage. PMH significant for arthritis, BPH, COPD, COPD, glaucoma, heart murmur, HTN, multiple spinal surgeries, L knee arthroscopy.    PT Comments  Pt is demonstrating gradual improvements in cognition and functional mobility during sessions. Pt is more conversational today and answering 50% of questions appropriately. Pt able to remain on task and count for himself while performing seated exercises. Pt requires modA for bed mobility and does better sequencing with light cues. Pt with good trunk control EOB and able to maintain a static sitting position ~5 minutes while taking medications. Pt initially requires modA to stand from elevated surface, but improves to minA with counting down and rocking for momentum. Pt better able to coordinate stepping pattern to transfer to the recliner. STS x3 completed at end of session to promote strengthening, static balance, and standing posture. Pt would benefit from continued PT services focused on strength, balance, transfers, and gait to promote safety and independence with functional mobility.   Recommendation updated to <3hrs rehab at this time due to level of assist that would be required at home. Pt's wife wants to take the patient home and reports having a good support system. Pt is making gradual improvements and could progress to [facility] PT level safely depending on length of stay. Discussed benefits of rehab to family and will coordinate with CM to discuss options.     If plan is discharge home, recommend the following: Two people to help with walking and/or transfers;Two people to help with bathing/dressing/bathroom;Assistance with cooking/housework;Direct supervision/assist for medications management;Direct supervision/assist for financial  management;Assist for transportation;Help with stairs or ramp for entrance;Supervision due to cognitive status   Can travel by private vehicle     No (Cognition, level of assist, oxygen requirements)  Equipment Recommendations  Rolling walker (2 wheels);BSC/3in1;Wheelchair (measurements PT);Wheelchair cushion (measurements PT)    Recommendations for Other Services       Precautions / Restrictions Precautions Precautions: Fall;Other (comment) Recall of Precautions/Restrictions: Impaired Precaution/Restrictions Comments: watch O2/HR Restrictions Weight Bearing Restrictions Per Provider Order: No     Mobility  Bed Mobility Overal bed mobility: Needs Assistance Bed Mobility: Supine to Sit     Supine to sit: Mod assist     General bed mobility comments: Improved sequencing LE motion for bed mobility, dfficulty completing due to pain. Assist required for trunk control and repositioning EOB.    Transfers Overall transfer level: Needs assistance Equipment used: Rolling walker (2 wheels) Transfers: Sit to/from Stand, Bed to chair/wheelchair/BSC Sit to Stand: Min assist, Mod assist   Step pivot transfers: Mod assist       General transfer comment: Pt with difficulty standing from a static position, benefits from rocking to a 3-count for momentum, improves to minA with this strategy. Improved L foot clearance and coordination of steps to pivot to the recliner. Continues to need assist for walker management.    Ambulation/Gait               General Gait Details: Not safe to ambulate further at this time due to cognition and level of fatigue.   Stairs             Wheelchair Mobility     Tilt Bed    Modified Rankin (Stroke Patients Only)       Balance Overall balance assessment:  Needs assistance Sitting-balance support: No upper extremity supported, Feet supported Sitting balance-Leahy Scale: Good Sitting balance - Comments: Pt tolerates sitting EOB  with supervision ~5 minutes while RN provides medication.   Standing balance support: Bilateral upper extremity supported, During functional activity, Reliant on assistive device for balance Standing balance-Leahy Scale: Poor Standing balance comment: Reliant on walker and external support for balance. Doing better to reposition feet upon standing to improve balance. No trunk lean noted this session.                            Communication Communication Communication: Impaired Factors Affecting Communication: Hearing impaired;Difficulty expressing self  Cognition Arousal: Alert Behavior During Therapy: Flat affect   PT - Cognitive impairments: Orientation, Awareness, Safety/Judgement, Problem solving, Sequencing, Initiation, Attention   Orientation impairments: Person, Place                   PT - Cognition Comments: Pt becoming more conversational. Able to tell me where he is and his birthday today. Able to indentify colors and objects in the room. Following commands: Impaired Following commands impaired: Follows one step commands inconsistently, Follows one step commands with increased time    Cueing Cueing Techniques: Verbal cues, Gestural cues, Tactile cues, Visual cues  Exercises General Exercises - Lower Extremity Ankle Circles/Pumps: AROM, 10 reps, Both, Seated Long Arc Quad: AROM, Both, 10 reps, Seated Hip Flexion/Marching: AROM, Both, 10 reps, Seated (within a modified range) Other Exercises Other Exercises: STS x3    General Comments General comments (skin integrity, edema, etc.): VSS throughout. B LE swelling and red (improving), warm to touch. No new skin abnormalities noted.      Pertinent Vitals/Pain Pain Assessment Pain Assessment: Faces Faces Pain Scale: Hurts little more Pain Location: B LE (Primarily during changes in position) Pain Descriptors / Indicators: Grimacing, Guarding, Moaning, Aching, Sore Pain Intervention(s): Limited activity  within patient's tolerance, Repositioned, Monitored during session, RN gave pain meds during session    Home Living                          Prior Function            PT Goals (current goals can now be found in the care plan section) Acute Rehab PT Goals Patient Stated Goal: Go home PT Goal Formulation: With patient/family Time For Goal Achievement: 02/06/24 Potential to Achieve Goals: Good Progress towards PT goals: Progressing toward goals    Frequency    Min 2X/week      PT Plan      Co-evaluation              AM-PAC PT 6 Clicks Mobility   Outcome Measure  Help needed turning from your back to your side while in a flat bed without using bedrails?: A Little Help needed moving from lying on your back to sitting on the side of a flat bed without using bedrails?: A Little Help needed moving to and from a bed to a chair (including a wheelchair)?: A Lot Help needed standing up from a chair using your arms (e.g., wheelchair or bedside chair)?: A Lot Help needed to walk in hospital room?: Total Help needed climbing 3-5 steps with a railing? : Total 6 Click Score: 12    End of Session Equipment Utilized During Treatment: Gait belt;Oxygen Activity Tolerance: Patient limited by fatigue;Patient limited by pain Patient left: in chair;with call bell/phone  within reach;with chair alarm set Nurse Communication: Mobility status PT Visit Diagnosis: Unsteadiness on feet (R26.81);Muscle weakness (generalized) (M62.81) Pain - Right/Left:  (Bilateral) Pain - part of body: Leg     Time: 0940-1008 PT Time Calculation (min) (ACUTE ONLY): 28 min  Charges:    $Therapeutic Exercise: 8-22 mins $Therapeutic Activity: 8-22 mins PT General Charges $$ ACUTE PT VISIT: 1 Visit                     Sabra Morel, PT, DPT  Acute Rehabilitation Services         Office: [phone]     Sabra MARLA Morel 01/25/2024, 12:55 PM

## 2024-01-25 NOTE — Progress Notes (Signed)
 [facility] night cross cover note:   I was notified by the patient's RN of the patient's potassium level this morning of 2.7.  I subsequently ordered potassium chloride  40 meq po x 1 dose now.      Eva Pore, DO Hospitalist

## 2024-01-25 NOTE — Progress Notes (Signed)
 [facility] Progress Note Patient: Cody Tate FMW:[id number] DOB: 1950/11/12  DOA: 01/20/2024 DOS: the patient was seen and examined on 01/25/2024  Brief Summary: Patient with PMH of COPD, HTN, chronic HFpEF, CAD, liver cirrhosis, neuropathy, GERD, chronic pain, HLD, chronic edema present to the hospital with complaints of shortness of breath cough lethargy as well as boil on the left leg for 1 week with confusion. Was found to have severe sepsis due to cellulitis.  X-ray also shows possibility of pneumonia.  Viral test were negative.   Significant events: 1/19 admitted to the hospital.  CT PE protocol negative for PE. 1/21 lower extremity Doppler negative for DVT.  ABG shows significant hypoxia. 1/22 echocardiogram shows EF more than 75%, no significant valvular abnormality. CT head unremarkable for any acute abnormality.  CT abdomen and pelvis unremarkable for acute abnormality.  Shows some constipation.  Consults: Palliative care    Assessment and Plan: Severe sepsis due to combination of pneumonia as well as cellulitis. Primary reason for presentation is boil on his left inner thigh. CT scan shows evidence of significant subcutaneous tissue without any gas formation. No evidence of bony involvement. Blood cultures so far negative. Started on IV vancomycin  and cefepime .  Switching to doxycycline  and Unasyn . Blood cultures so far negative. Prior history of Pseudomonas, although from chart review appears that this was considered not an infectious cause of the presentation.   Acute hypoxic respiratory failure secondary to left lobar community-acquired pneumonia. At baseline uses 2 LPM. Required 10 LPM on 1/21. VBG shows evidence of hypoxia. Treated with IV antibiotic as well as IV Lasix . BiPAP as needed.  Currently on room air. Switching to Lasix  oral twice daily.   Acute on chronic HFpEF. HTN. On Lasix  at home. Prior echocardiogram shows EF of 70-75%. Current  echocardiogram unchanged. Significantly volume overloaded, responding well to IV Lasix   Switching to oral Lasix . BiPAP as needed.   Chronic pain syndrome. Chronically on pain medication 10 mg every 4 hours of oxycodone . Patient presented with significant confusion and respiratory distress and therefore medication was switched to as needed. At present there is concern for opioid withdrawal and patient appears to be responding well to opiate medication. Will increase oxycodone  back to home regimen dose at lower frequency of every 6 hours inserted every 4 hours.   BPH. Has been on Flomax . Held for now.   Chronic constipation. Appears to have resolved. Will continue aggressive bowel regimen.   COPD. Appears to be in respiratory distress although does not appear to have any significant COPD exacerbation. Due to his respiratory distress I will switch him from Breztri  to Brovana  Pulmicort  Yupelri  combination.   Acute metabolic encephalopathy. Combination of hypoxia as well as sepsis.  Also possible opioid withdrawal. Significant improvement.  Continue current regimen of pain medications.   AKI on CKD stage II. Baseline creatinine 0.7. Creatinine on admission 1.65. Currently improving back to 0.7. Monitor while receiving IV Lasix .   History of spinal cord stimulator. Monitor for now.   Bilateral lower extremity edema. Appears to be chronic. Patient is noncompliant with Lasix  and Aldactone  at home. Lower extremity Doppler negative for DVT.  Monitor.   HLD. On Lipitor. CK level normal.   GERD. Continue PPI.   Hypophosphatemia. Replacing with IV sodium phosphate . Monitor.   Obesity class II. Body mass index is 35.29 kg/m.  Placing the patient at high risk of poor outcome.   Goals of care Discussed with wife at bedside.  Patient reportedly has a living  will.  Per outpatient MOST form patient would like CPR and full aggressive measures. Palliative care was also  consulted.   Poor p.o. intake. Family reports poor p.o. intake concerns. Likely in the setting of severe critical illness. For now we will monitor.   Hypokalemia. Hypomagnesemia. Hypophosphatemia. Continue aggressive replacement. Recheck BMP.  Oral thrush. Initiating on Diflucan .    Code Status: Full Code   DVT Prophylaxis: SCDs Start: 01/20/24 1927 Place TED hose Start: 01/20/24 1927   Data review I have Reviewed nursing notes, Vitals, and Lab results. Since last encounter, pertinent lab results CBC and BMP   . I have ordered test including CBC and BMP  .   Physical exam. Vitals:   01/25/24 0503 01/25/24 0750 01/25/24 1146 01/25/24 1617  BP: (!) 177/68 (!) 150/69 (!) 156/68 114/89  Pulse: 77     Resp: (!) 24 19 20 20   Temp: (!) 97.3 F (36.3 C) 97.8 F (36.6 C) 98.1 F (36.7 C) 99 F (37.2 C)  TempSrc: Oral Oral Oral Oral  SpO2:      Weight:      Height:        General: in Mild distress, No Rash Cardiovascular: S1 and S2 Present, No Murmur Respiratory: Good respiratory effort, Bilateral Air entry present.  Left basal crackles, No wheezes Abdomen: Bowel Sound present, No tenderness Extremities: Improving edema Neuro: Alert and oriented x3, no new focal deficit   Subjective: Denies any acute complaint.  No nausea no vomiting no fever no chills.  Still has some cough.  Family Communication: Family at bedside.  Disposition Plan: Status is: Inpatient Remains inpatient appropriate because: Monitor for improvement in respiratory status   Planned Discharge Destination: TBD Diet: Diet Order             DIET DYS 3 Room service appropriate? Yes; Fluid consistency: Thin  Diet effective now                   MEDICATIONS: Scheduled Meds:  arformoterol   15 mcg Nebulization BID   vitamin C   1,000 mg Oral Daily   budesonide  (PULMICORT ) nebulizer solution  0.25 mg Nebulization BID   Chlorhexidine  Gluconate Cloth  6 each Topical Daily   doxycycline   100 mg  Oral Q12H   enoxaparin  (LOVENOX ) injection  50 mg Subcutaneous Q24H   fluconazole   100 mg Oral Daily   fluticasone   1 spray Each Nare BID   furosemide   40 mg Oral BID   oxyCODONE   10 mg Oral Q6H   pantoprazole   40 mg Oral Daily   revefenacin   175 mcg Nebulization Daily   rosuvastatin   20 mg Oral QHS   senna-docusate  1 tablet Oral BID   timolol   1 drop Both Eyes QHS   Continuous Infusions:  ampicillin -sulbactam (UNASYN ) IV 3 g (01/25/24 1808)   PRN Meds:.acetaminophen  **OR** acetaminophen , ipratropium-albuterol , ondansetron  **OR** ondansetron  (ZOFRAN ) IV, oxyCODONE , simethicone , sodium chloride  flush  Author: Yetta Blanch, MD  [facility] 01/25/2024  7:00 PM Between 7PM-7AM, please contact night-coverage, check [email or web address] for on call.

## 2024-01-26 DIAGNOSIS — J9601 Acute respiratory failure with hypoxia: Secondary | ICD-10-CM | POA: Diagnosis not present

## 2024-01-26 DIAGNOSIS — R6520 Severe sepsis without septic shock: Secondary | ICD-10-CM | POA: Diagnosis not present

## 2024-01-26 DIAGNOSIS — A419 Sepsis, unspecified organism: Secondary | ICD-10-CM | POA: Diagnosis not present

## 2024-01-26 LAB — CBC WITH DIFFERENTIAL/PLATELET
Basophils Absolute: 0 10*3/uL (ref 0.0–0.1)
Basophils Relative: 0 %
Eosinophils Absolute: 0.2 10*3/uL (ref 0.0–0.5)
Eosinophils Relative: 2 %
HCT: 41 % (ref 39.0–52.0)
Hemoglobin: 13.7 g/dL (ref 13.0–17.0)
Lymphocytes Relative: 4 %
Lymphs Abs: 0.5 10*3/uL — ABNORMAL LOW (ref 0.7–4.0)
MCH: 30.6 pg (ref 26.0–34.0)
MCHC: 33.4 g/dL (ref 30.0–36.0)
MCV: 91.5 fL (ref 80.0–100.0)
Monocytes Absolute: 1.9 10*3/uL — ABNORMAL HIGH (ref 0.1–1.0)
Monocytes Relative: 16 %
Neutro Abs: 9 10*3/uL — ABNORMAL HIGH (ref 1.7–7.7)
Neutrophils Relative %: 78 %
Platelets: 245 10*3/uL (ref 150–400)
RBC: 4.48 MIL/uL (ref 4.22–5.81)
RDW: 13.4 % (ref 11.5–15.5)
WBC: 11.6 10*3/uL — ABNORMAL HIGH (ref 4.0–10.5)
nRBC: 0 % (ref 0.0–0.2)

## 2024-01-26 LAB — BASIC METABOLIC PANEL WITH GFR
Anion gap: 9 (ref 5–15)
BUN: 22 mg/dL (ref 8–23)
CO2: 31 mmol/L (ref 22–32)
Calcium: 8.6 mg/dL — ABNORMAL LOW (ref 8.9–10.3)
Chloride: 99 mmol/L (ref 98–111)
Creatinine, Ser: 0.65 mg/dL (ref 0.61–1.24)
GFR, Estimated: >60 mL/min
Glucose, Bld: 81 mg/dL (ref 70–99)
Potassium: 3.3 mmol/L — ABNORMAL LOW (ref 3.5–5.1)
Sodium: 139 mmol/L (ref 135–145)

## 2024-01-26 LAB — MAGNESIUM: Magnesium: 1.6 mg/dL — ABNORMAL LOW (ref 1.7–2.4)

## 2024-01-26 LAB — PHOSPHORUS: Phosphorus: 3.1 mg/dL (ref 2.5–4.6)

## 2024-01-26 MED ORDER — POTASSIUM CHLORIDE CRYS ER 20 MEQ PO TBCR
40.0000 meq | EXTENDED_RELEASE_TABLET | ORAL | Status: AC
  Administered 2024-01-26 (×2): 40 meq via ORAL
  Filled 2024-01-26 (×2): qty 2

## 2024-01-26 MED ORDER — MAGNESIUM SULFATE 4 GM/100ML IV SOLN
4.0000 g | Freq: Once | INTRAVENOUS | Status: AC
  Administered 2024-01-26: 4 g via INTRAVENOUS
  Filled 2024-01-26: qty 100

## 2024-01-26 MED ORDER — TAMSULOSIN HCL 0.4 MG PO CAPS
0.4000 mg | ORAL_CAPSULE | Freq: Every day | ORAL | Status: DC
  Administered 2024-01-26: 0.4 mg via ORAL
  Filled 2024-01-26: qty 1

## 2024-01-26 MED ORDER — DOXYCYCLINE HYCLATE 100 MG PO TABS
100.0000 mg | ORAL_TABLET | Freq: Two times a day (BID) | ORAL | Status: DC
  Administered 2024-01-26 – 2024-01-27 (×3): 100 mg via ORAL
  Filled 2024-01-26 (×3): qty 1

## 2024-01-26 MED ORDER — HYDROXYZINE HCL 25 MG PO TABS
25.0000 mg | ORAL_TABLET | Freq: Once | ORAL | Status: AC
  Administered 2024-01-26: 25 mg via ORAL
  Filled 2024-01-26: qty 1

## 2024-01-26 MED ORDER — BUDESON-GLYCOPYRROL-FORMOTEROL 160-9-4.8 MCG/ACT IN AERO
2.0000 | INHALATION_SPRAY | Freq: Two times a day (BID) | RESPIRATORY_TRACT | Status: DC
  Administered 2024-01-26: 2 via RESPIRATORY_TRACT
  Filled 2024-01-26: qty 5.9

## 2024-01-26 MED ORDER — FUROSEMIDE 10 MG/ML IJ SOLN
40.0000 mg | Freq: Once | INTRAMUSCULAR | Status: AC
  Administered 2024-01-26: 40 mg via INTRAVENOUS
  Filled 2024-01-26: qty 4

## 2024-01-26 MED ORDER — TORSEMIDE 20 MG PO TABS: 20.0000 mg | ORAL_TABLET | Freq: Two times a day (BID) | ORAL | Status: DC

## 2024-01-26 MED ORDER — AMOXICILLIN-POT CLAVULANATE 875-125 MG PO TABS
1.0000 | ORAL_TABLET | Freq: Two times a day (BID) | ORAL | Status: DC
  Administered 2024-01-26 – 2024-01-27 (×3): 1 via ORAL
  Filled 2024-01-26 (×3): qty 1

## 2024-01-26 NOTE — Progress Notes (Signed)
 [facility] Progress Note Patient: Cody Tate FMW:[id number] DOB: 1950/07/11  DOA: 01/20/2024 DOS: the patient was seen and examined on 01/26/2024  Brief Summary: Patient with PMH of COPD, HTN, chronic HFpEF, CAD, liver cirrhosis, neuropathy, GERD, chronic pain, HLD, chronic edema present to the hospital with complaints of shortness of breath cough lethargy as well as boil on the left leg for 1 week with confusion. Was found to have severe sepsis due to cellulitis.  X-ray also shows possibility of pneumonia.  Viral test were negative.   Significant events: 1/19 admitted to the hospital.  CT PE protocol negative for PE. 1/21 lower extremity Doppler negative for DVT.  ABG shows significant hypoxia. 1/22 echocardiogram shows EF more than 75%, no significant valvular abnormality. CT head unremarkable for any acute abnormality.  CT abdomen and pelvis unremarkable for acute abnormality.  Shows some constipation.   1/25 antibiotics changed to oral and Lasix  given IV again and switch to oral torsemide  starting 1/26.  Consults: Palliative care    Assessment and Plan: Severe sepsis due to combination of pneumonia as well as cellulitis. Primary reason for presentation is boil on his left inner thigh. CT scan shows evidence of significant subcutaneous tissue without any gas formation. No evidence of bony involvement. Blood cultures so far negative. Started on IV vancomycin  and cefepime .  Later on switched to doxycycline  and Unasyn . Currently on oral doxycycline  and Augmentin . Prior history of Pseudomonas, although from chart review appears that this was considered not an infectious cause of his presentation at the time.   Acute hypoxic respiratory failure secondary to left lobar community-acquired pneumonia. At baseline uses 2 LPM. Required 10 LPM on 1/21. VBG shows evidence of hypoxia. Treated wi with th IV antibiotic as well as IV Lasix . BiPAP as needed.  Currently on room  air. Switching to Lasix  oral torsemide  twice daily.   Acute on chronic HFpEF. HTN. On Lasix  at home. Prior echocardiogram shows EF of 70-75%. Current echocardiogram unchanged. Significantly volume overloaded, responding well to IV Lasix   1 more dose of IV Lasix  followed by oral torsemide . BiPAP as needed.   Chronic pain syndrome. Opioid withdrawal. Chronically on oxycodone  10 mg every 4 hours. Patient presented with significant confusion and respiratory distress and therefore medication was switched to as needed. Suspect he developed some opioid withdrawal. Mentation improved significantly after resolution of the sepsis physiology, hypoxia as well as resumption of his home oral narcotics. For now pain appears to be well-controlled therefore I will continue every 6 hours regimen instead of every 4 hours.   BPH. Has been on Flomax . Resumed.   Chronic constipation.  Resolved. Continue aggressive bowel regimen.   COPD. Appears to be in respiratory distress although does not appear to have any significant COPD exacerbation. Currently on Brovana  Pulmicort  Yupelri  combination.  Switch back to Breztri .   Acute metabolic encephalopathy. As above combination of hypoxia sepsis and possible opioid withdrawal. Significant improvement.   AKI on CKD stage II. Baseline creatinine 0.7. Creatinine on admission 1.65. Currently improving back to 0.7.   History of spinal cord stimulator. Monitor for now.   Bilateral lower extremity edema. Appears to be chronic. Patient is noncompliant with Lasix  and Aldactone  at home.  Also Lower extremity Doppler negative for DVT.  Monitor.  Liver cirrhosis. There is multiple documentation about patient having liver cirrhosis and patient is actually on Lasix  and Aldactone . CT abdomen pelvis this admission shows no focal liver abnormality. Recent abdominal ultrasound shows lobular liver architecture-not nodular. Unsure if  patient actually has liver  cirrhosis based on this.   HLD. On Lipitor. CK level normal.   GERD. Continue PPI.   Hypophosphatemia.  Resolved. Replaced.   Obesity class II. Body mass index is 35.29 kg/m.  Placing the patient at high risk of poor outcome.   Goals of care Discussed with wife at bedside.  Patient reportedly has a living will.  Per outpatient MOST form patient would like CPR and full aggressive measures. Palliative care was also consulted.   Poor p.o. intake. Family reports poor p.o. intake concerns. Likely in the setting of severe critical illness. Now resolved.   Hypokalemia. Hypomagnesemia. Continue aggressive replacement. Recheck BMP.  Oral thrush. Initiating on Diflucan .   DVT Prophylaxis: SCDs Start: 01/20/24 1927 Place TED hose Start: 01/20/24 1927  Data review I have Reviewed nursing notes, Vitals, and Lab results. Since last encounter, pertinent lab results CBC and BMP   . I have ordered test including CBC and BMP  .   Physical exam. Vitals:   01/26/24 0800 01/26/24 1200 01/26/24 1700 01/26/24 1827  BP: (!) 149/71 (!) 145/58 (!) 156/58 (!) 123/58  Pulse: 85 84 91   Resp: 16 (!) 25 18   Temp: 98.4 F (36.9 C)  98 F (36.7 C) 97.7 F (36.5 C)  TempSrc: Axillary Oral Oral Oral  SpO2: 99% 90% 97%   Weight:      Height:        General: in Mild distress, No Rash Cardiovascular: S1 and S2 Present, No Murmur Respiratory: Good respiratory effort, Bilateral Air entry present. No Crackles, No wheezes Abdomen: Bowel Sound present, No tenderness Extremities: Somewhat increased edema.  No worsening of erythema. Neuro: Alert and oriented x3, no new focal deficit   Subjective: Denies any acute complaint.  No nausea no vomiting.  Appetite improving.  Wife thinks that he is at his best stage in the last few weeks.  Family Communication: Wife at bedside.  Disposition Plan: Status is: Inpatient Remains inpatient appropriate because: Monitor for stability of volume level    Planned Discharge Destination:Home SNF recommended.  Wife wants to take the patient home if possible. Diet: Diet Order             DIET DYS 3 Room service appropriate? Yes; Fluid consistency: Thin  Diet effective now                   MEDICATIONS: Scheduled Meds:  amoxicillin -clavulanate  1 tablet Oral Q12H   vitamin C   1,000 mg Oral Daily   budesonide -glycopyrrolate -formoterol   2 puff Inhalation BID   Chlorhexidine  Gluconate Cloth  6 each Topical Daily   doxycycline   100 mg Oral Q12H   enoxaparin  (LOVENOX ) injection  50 mg Subcutaneous Q24H   fluconazole   100 mg Oral Daily   fluticasone   1 spray Each Nare BID   oxyCODONE   10 mg Oral Q6H   pantoprazole   40 mg Oral Daily   rosuvastatin   20 mg Oral QHS   senna-docusate  1 tablet Oral BID   tamsulosin   0.4 mg Oral QPC supper   timolol   1 drop Both Eyes QHS   [START ON 01/27/2024] torsemide   20 mg Oral BID   Continuous Infusions: PRN Meds:.acetaminophen  **OR** acetaminophen , ipratropium-albuterol , ondansetron  **OR** ondansetron  (ZOFRAN ) IV, oxyCODONE , simethicone , sodium chloride  flush  Author: Yetta Blanch, MD  [facility] 01/26/2024  6:30 PM Between 7PM-7AM, please contact night-coverage, check [email or web address] for on call.

## 2024-01-27 ENCOUNTER — Other Ambulatory Visit (HOSPITAL_COMMUNITY): Payer: Self-pay

## 2024-01-27 DIAGNOSIS — A419 Sepsis, unspecified organism: Secondary | ICD-10-CM | POA: Diagnosis not present

## 2024-01-27 DIAGNOSIS — R6520 Severe sepsis without septic shock: Secondary | ICD-10-CM | POA: Diagnosis not present

## 2024-01-27 DIAGNOSIS — J9601 Acute respiratory failure with hypoxia: Secondary | ICD-10-CM | POA: Diagnosis not present

## 2024-01-27 LAB — PULMONARY FUNCTION TEST
DL/VA % pred: 94 %
DL/VA: 3.83 ml/min/mmHg/L
DLCO cor % pred: 91 %
DLCO cor: 19.79 ml/min/mmHg
DLCO unc % pred: 91 %
DLCO unc: 19.79 ml/min/mmHg
FEF 25-75 Post: 1 L/s
FEF 25-75 Pre: 1.07 L/s
FEF2575-%Change-Post: -7 %
FEF2575-%Pred-Post: 54 %
FEF2575-%Pred-Pre: 58 %
FEV1-%Change-Post: -2 %
FEV1-%Pred-Post: 75 %
FEV1-%Pred-Pre: 77 %
FEV1-Post: 1.88 L
FEV1-Pre: 1.93 L
FEV1FVC-%Change-Post: -5 %
FEV1FVC-%Pred-Pre: 94 %
FEV6-%Change-Post: 1 %
FEV6-%Pred-Post: 88 %
FEV6-%Pred-Pre: 86 %
FEV6-Post: 2.84 L
FEV6-Pre: 2.79 L
FEV6FVC-%Change-Post: -1 %
FEV6FVC-%Pred-Post: 105 %
FEV6FVC-%Pred-Pre: 107 %
FVC-%Change-Post: 3 %
FVC-%Pred-Post: 83 %
FVC-%Pred-Pre: 80 %
FVC-Post: 2.89 L
FVC-Pre: 2.8 L
Post FEV1/FVC ratio: 65 %
Post FEV6/FVC ratio: 98 %
Pre FEV1/FVC ratio: 69 %
Pre FEV6/FVC Ratio: 100 %
RV % pred: 142 %
RV: 3.22 L
TLC % pred: 105 %
TLC: 6.33 L

## 2024-01-27 LAB — CBC WITH DIFFERENTIAL/PLATELET
Abs Immature Granulocytes: 0.69 10*3/uL — ABNORMAL HIGH (ref 0.00–0.07)
Basophils Absolute: 0.1 10*3/uL (ref 0.0–0.1)
Basophils Relative: 1 %
Eosinophils Absolute: 0.3 10*3/uL (ref 0.0–0.5)
Eosinophils Relative: 2 %
HCT: 41.7 % (ref 39.0–52.0)
Hemoglobin: 13.6 g/dL (ref 13.0–17.0)
Immature Granulocytes: 5 %
Lymphocytes Relative: 6 %
Lymphs Abs: 0.9 10*3/uL (ref 0.7–4.0)
MCH: 30 pg (ref 26.0–34.0)
MCHC: 32.6 g/dL (ref 30.0–36.0)
MCV: 91.9 fL (ref 80.0–100.0)
Monocytes Absolute: 1.3 10*3/uL — ABNORMAL HIGH (ref 0.1–1.0)
Monocytes Relative: 8 %
Neutro Abs: 11.8 10*3/uL — ABNORMAL HIGH (ref 1.7–7.7)
Neutrophils Relative %: 78 %
Platelets: 258 10*3/uL (ref 150–400)
RBC: 4.54 MIL/uL (ref 4.22–5.81)
RDW: 13.4 % (ref 11.5–15.5)
WBC: 15 10*3/uL — ABNORMAL HIGH (ref 4.0–10.5)
nRBC: 0 % (ref 0.0–0.2)

## 2024-01-27 LAB — BASIC METABOLIC PANEL WITH GFR
Anion gap: 9 (ref 5–15)
BUN: 18 mg/dL (ref 8–23)
CO2: 31 mmol/L (ref 22–32)
Calcium: 8.9 mg/dL (ref 8.9–10.3)
Chloride: 95 mmol/L — ABNORMAL LOW (ref 98–111)
Creatinine, Ser: 0.63 mg/dL (ref 0.61–1.24)
GFR, Estimated: >60 mL/min
Glucose, Bld: 98 mg/dL (ref 70–99)
Potassium: 3.4 mmol/L — ABNORMAL LOW (ref 3.5–5.1)
Sodium: 135 mmol/L (ref 135–145)

## 2024-01-27 LAB — MAGNESIUM: Magnesium: 2 mg/dL (ref 1.7–2.4)

## 2024-01-27 MED ORDER — VASHE WOUND EX SOLN
1.0000 | CUTANEOUS | 0 refills | Status: AC
  Filled 2024-01-27: qty 1000, fill #0

## 2024-01-27 MED ORDER — POTASSIUM CHLORIDE CRYS ER 20 MEQ PO TBCR
20.0000 meq | EXTENDED_RELEASE_TABLET | Freq: Two times a day (BID) | ORAL | 0 refills | Status: AC
  Filled 2024-01-27: qty 14, 7d supply, fill #0

## 2024-01-27 MED ORDER — DOXYCYCLINE HYCLATE 100 MG PO TABS
100.0000 mg | ORAL_TABLET | Freq: Two times a day (BID) | ORAL | 0 refills | Status: AC
  Filled 2024-01-27: qty 6, 3d supply, fill #0

## 2024-01-27 MED ORDER — METOPROLOL SUCCINATE ER 25 MG PO TB24
12.5000 mg | ORAL_TABLET | Freq: Every day | ORAL | Status: DC
  Administered 2024-01-27: 12.5 mg via ORAL
  Filled 2024-01-27: qty 1

## 2024-01-27 MED ORDER — TORSEMIDE 20 MG PO TABS
40.0000 mg | ORAL_TABLET | Freq: Every day | ORAL | Status: DC
  Administered 2024-01-27: 40 mg via ORAL
  Filled 2024-01-27: qty 2

## 2024-01-27 MED ORDER — AMOXICILLIN-POT CLAVULANATE 875-125 MG PO TABS
1.0000 | ORAL_TABLET | Freq: Two times a day (BID) | ORAL | 0 refills | Status: AC
  Filled 2024-01-27: qty 6, 3d supply, fill #0

## 2024-01-27 MED ORDER — TORSEMIDE 20 MG PO TABS
40.0000 mg | ORAL_TABLET | Freq: Every day | ORAL | 0 refills | Status: AC
  Filled 2024-01-27: qty 60, 30d supply, fill #0

## 2024-01-27 MED ORDER — FLUCONAZOLE 100 MG PO TABS
100.0000 mg | ORAL_TABLET | Freq: Every day | ORAL | 0 refills | Status: AC
  Filled 2024-01-27: qty 3, 3d supply, fill #0

## 2024-01-27 MED ORDER — METOPROLOL SUCCINATE ER 25 MG PO TB24
12.5000 mg | ORAL_TABLET | Freq: Every day | ORAL | 0 refills | Status: AC
  Filled 2024-01-27: qty 30, 60d supply, fill #0

## 2024-01-27 MED ORDER — POTASSIUM CHLORIDE CRYS ER 20 MEQ PO TBCR
40.0000 meq | EXTENDED_RELEASE_TABLET | ORAL | Status: AC
  Administered 2024-01-27 (×2): 40 meq via ORAL
  Filled 2024-01-27: qty 2

## 2024-01-27 NOTE — TOC Transition Note (Signed)
 Transition of Care [facility]) - Discharge Note   Patient Details  Name: Cody Tate MRN: [id number] Date of Birth: 1950-09-08  [facility] CM/SW Contact:  Landry DELENA Senters, RN Phone Number: 01/27/2024, 12:27 PM   Clinical Narrative:     Patient will be discharging to home today, with wife providing transportation.   Therapy rec for SNF. Patient and wife refuse this and request HH services. HH arranged through [facility], info on AVS. Patient's wife refused ambulance transportation home, reporting she can get him home and has assistance from family if needed.  DME - W/C, RW, BSC rec per therapy. Patient has RW at home, wife and patient refused need for [facility]. W/C ordered through [employer] and to be delivered to patient's home. Address verified.   CM did let bedside nurse know patient will need wound care supplies sent home with them until [facility] agency is able to see patient.   No further needs identified by CM.  Final next level of care: Home w Home Health Services Barriers to Discharge: No Barriers Identified   Patient Goals and CMS Choice   CMS Medicare.gov Compare Post Acute Care list provided to:: Patient Choice offered to / list presented to : Patient      Discharge Placement                       Discharge Plan and Services Additional resources added to the After Visit Summary for                  DME Arranged: Lightweight manual wheelchair with seat cushion DME Agency: [employer] Date DME Agency Contacted: 01/27/24 Time DME Agency Contacted: 1226 Representative spoke with at DME Agency: [place] HH Arranged: RN, PT, OT, Nurse's Aide HH Agency: [facility] Date [facility] Agency Contacted: 01/27/24 Time HH Agency Contacted: 1227 Representative spoke with at [facility] Agency: [place]  Social Drivers of Health (SDOH) Interventions SDOH Screenings   Food Insecurity: No Food Insecurity (01/21/2024)  Housing: Low Risk (01/21/2024)  Transportation Needs: No  Transportation Needs (01/21/2024)  Utilities: Not At Risk (01/21/2024)  Social Connections: Moderately Integrated (01/21/2024)  Tobacco Use: Medium Risk (01/20/2024)     Readmission Risk Interventions    06/28/2023    3:10 PM  Readmission Risk Prevention Plan  Transportation Screening Complete  PCP or Specialist Appt within 5-7 Days Complete  Home Care Screening Complete  Medication Review (RN CM) Complete

## 2024-01-27 NOTE — Progress Notes (Signed)
 RUE PICC removed. Site unremarkable. Pressure dressing applied. Pt and wife instructed to leave dressing dry and intact x24 hours. Remain flat in bed for 30 minutes. Both voiced understanding. Report to [employer], [facility].

## 2024-01-27 NOTE — Progress Notes (Signed)
" °  °  Durable Medical Equipment  (From admission, onward)           Start     Ordered   01/27/24 1156  For home use only DME lightweight manual wheelchair with seat cushion  Once       Comments: Patient suffers from weakness which impairs their ability to perform daily activities like bathing, dressing, and grooming in the home.  A walker will not resolve  issue with performing activities of daily living. A wheelchair will allow patient to safely perform daily activities. Patient is not able to propel themselves in the home using a standard weight wheelchair due to general weakness. Patient can self propel in the lightweight wheelchair. Length of need 12 months . Accessories: elevating leg rests (ELRs), wheel locks, extensions and anti-tippers.   01/27/24 1156   01/27/24 1139  For home use only DME oxygen  Once       Question Answer Comment  Length of Need Lifetime   Mode or (Route) Nasal cannula   Liters per Minute 2   Frequency Continuous (stationary and portable oxygen unit needed)   Oxygen delivery system: Gas      01/27/24 1138            "

## 2024-01-27 NOTE — Plan of Care (Signed)

## 2024-01-28 NOTE — Discharge Summary (Signed)
 " Physician Discharge Summary   Patient: Cody Tate MRN: [id number] DOB: 05-20-1950  Admit date:     01/20/2024  Discharge date: 01/27/2024  Discharge Physician: Yetta Blanch  PCP: Charlott Dorn LABOR, MD  Disposition: Home with home health.  SNF was recommended. Recommendations at discharge: Follow-up with PCP in 1 week. Repeat CBC and BMP in 1 week.   Contact information for follow-up providers     Charlott Dorn LABOR, MD. Schedule an appointment as soon as possible for a visit in 1 week(s).   Specialty: Internal Medicine Why: with BMP lab to look at kidney/electrolyte numbers, with CBC lab to look at blood counts Contact information: [address] [place] [address] [phone]              Contact information for after-discharge care     Home Medical Care     [facility]) .   Service: Home Health Services Contact information: [phone] [place]  [phone]                    Hospital Course: Patient with PMH of COPD, HTN, chronic HFpEF, CAD, liver cirrhosis, neuropathy, GERD, chronic pain, HLD, chronic edema present to the hospital with complaints of shortness of breath cough lethargy as well as boil on the left leg for 1 week with confusion. Was found to have severe sepsis due to cellulitis.  X-ray also shows possibility of pneumonia.  Viral test were negative.   Significant events: 1/19 admitted to the hospital.  CT PE protocol negative for PE. 1/21 lower extremity Doppler negative for DVT.  ABG shows significant hypoxia. 1/22 echocardiogram shows EF more than 75%, no significant valvular abnormality. CT head unremarkable for any acute abnormality.  CT abdomen and pelvis unremarkable for acute abnormality.  Shows some constipation.   1/25 antibiotics changed to oral and Lasix  given IV again and switch to oral torsemide  starting 1/26.  Consults: Palliative care     Assessment and Plan: Severe sepsis due to combination of pneumonia as well as cellulitis. Primary reason for presentation is boil on his left inner thigh. CT scan shows evidence of significant subcutaneous tissue without any gas formation. No evidence of bony involvement. Blood cultures so far negative. Started on IV vancomycin  and cefepime .  Later on switched to doxycycline  and Unasyn . Currently on oral doxycycline  and Augmentin . Prior history of Pseudomonas, although from chart review appears that this was considered not an infectious cause of his presentation at the time.   Acute hypoxic respiratory failure secondary to left lobar community-acquired pneumonia. At baseline uses 2 LPM. Required 10 LPM on 1/21. VBG shows evidence of hypoxia. Treated wi with th IV antibiotic as well as IV Lasix . BiPAP as needed.  Currently on room air. Switching Lasix  to oral torsemide  40 mg daily.   Acute on chronic HFpEF. HTN. On Lasix  at home. Prior echocardiogram shows EF of 70-75%. Current echocardiogram unchanged. Significantly volume overloaded, responding well to IV Lasix   1 more dose of IV Lasix  followed by oral torsemide . BiPAP as needed.   Chronic pain syndrome. Opioid withdrawal. Chronically on oxycodone  10 mg every 4 hours. Patient presented with significant confusion and respiratory distress and therefore medication was switched to as needed. Suspect he developed some opioid withdrawal. Mentation improved significantly after resolution of the sepsis physiology, hypoxia as well as resumption of his home oral narcotics. For now pain appears to be well-controlled on  every 6 hours regimen. Resuming home regimen upon discharge.   BPH. Has been on Flomax . Resumed.   Chronic constipation.  Resolved. Continue aggressive bowel regimen.   COPD. Appears to be in respiratory distress although does not appear to have any significant COPD exacerbation. Was on Brovana  Pulmicort  Yupelri   combination.  Switch back to Breztri .   Acute metabolic encephalopathy. As above combination of hypoxia sepsis and possible opioid withdrawal. Significant improvement.   AKI on CKD stage II. Baseline creatinine 0.7. Creatinine on admission 1.65. Currently improving back to 0.7.   History of spinal cord stimulator. Monitor for now.   Bilateral lower extremity edema. Appears to be chronic. Patient is noncompliant with Lasix  and Aldactone  at home.  Also Lower extremity Doppler negative for DVT.  Monitor.  Liver cirrhosis.  Reported history-CT negative. There is multiple documentation about patient having liver cirrhosis and patient is actually on Lasix  and Aldactone . CT abdomen pelvis this admission shows no focal liver abnormality. Recent abdominal ultrasound shows lobular liver architecture-not nodular. Unsure if patient actually has liver cirrhosis based on this.   HLD. On Lipitor. CK level normal.   GERD. Continue PPI.   Hypophosphatemia.  Resolved. Replaced.   Obesity class II. Body mass index is 35.29 kg/m.  Placing the patient at high risk of poor outcome.   Goals of care Discussed with wife at bedside.  Patient reportedly has a living will.  Per outpatient MOST form patient would like CPR and full aggressive measures. Palliative care was also consulted.   Poor p.o. intake. Family reports poor p.o. intake concerns. Likely in the setting of severe critical illness. Now resolved.   Hypokalemia. Hypomagnesemia. Continue aggressive replacement. Recheck BMP.  Oral thrush. Initiating on Diflucan .   Family Communication: Plan was discussed with FamilyThe pt provided permission to discuss medical plan with the family. Opportunity was given to ask question and all questions were answered satisfactorily.  Consultants:  Palliative care  Procedures performed:  Echocardiogram  DISCHARGE MEDICATION: Allergies as of 01/27/2024       Reactions   Amlodipine  Besylate Other (See Comments)   fatigue   Aspirin  Other (See Comments)    GI upset   Other Itching, Other (See Comments), Rash, Dermatitis   VICRYL Sutures   Tape Itching, Other (See Comments), Rash   Occlusive tape and bandaids   Wound Dressing Adhesive Rash        Medication List     STOP taking these medications    carvedilol 6.25 MG tablet Commonly known as: COREG   furosemide  20 MG tablet Commonly known as: LASIX    telmisartan  80 MG tablet Commonly known as: MICARDIS        TAKE these medications    Afrin 12 Hour 0.05 % nasal spray Generic drug: oxymetazoline  Place 1 spray into both nostrils 2 (two) times daily as needed for congestion.   amoxicillin -clavulanate 875-125 MG tablet Commonly known as: AUGMENTIN  Take 1 tablet by mouth 2 (two) times daily for 3 days. What changed: See the new instructions.   Benadryl  Allergy  25 MG tablet Generic drug: diphenhydrAMINE  Take 25 mg by mouth at bedtime.   celecoxib  200 MG capsule Commonly known as: CELEBREX  Take 200-400 mg by mouth daily.   chlorhexidine  4 % external liquid Commonly known as: HIBICLENS  Apply 1 Application topically daily as needed (boils).   Combivent  Respimat 20-100 MCG/ACT Aers respimat Generic drug: Ipratropium-Albuterol  INHALE 1 INHALATION BY MOUTH  INTO THE LUNGS EVERY 6 HOURS AS  NEEDED FOR WHEEZING OR SHORTNESS  OF BREATH   doxycycline  100 MG tablet Commonly known as: VIBRA -TABS Take 1 tablet (100 mg total) by mouth every 12 (twelve) hours for 3 days.   fluconazole  100 MG tablet Commonly known as: DIFLUCAN  Take 1 tablet (100 mg total) by mouth daily for 3 days.   fluticasone  50 MCG/ACT nasal spray Commonly known as: FLONASE  Place 1 spray into both nostrils 2 (two) times daily.   gabapentin  300 MG capsule Commonly known as: NEURONTIN  Take 1 capsule (300 mg total) by mouth 4 (four) times daily.   Jardiance  10 MG Tabs tablet Generic drug: empagliflozin  Take 10 mg by mouth  daily.   Lutein-Zeaxanthin 25-5 MG Caps Take 1 capsule by mouth daily.   magic mouthwash w/lidocaine  Soln Take 15 mLs by mouth 4 (four) times daily as needed for mouth pain. contains equal amounts of Maalox Extra Strength, nystatin, diphenhydramine  and lidocaine .   magnesium  hydroxide 400 MG/5ML suspension Commonly known as: MILK OF MAGNESIA Take 15 mLs by mouth at bedtime.   metoprolol  succinate 25 MG 24 hr tablet Commonly known as: TOPROL -XL Take 0.5 tablets (12.5 mg total) by mouth daily.   montelukast  10 MG tablet Commonly known as: SINGULAIR  TAKE 1 TABLET BY MOUTH AT  BEDTIME   Mucinex  Maximum Strength 1200 MG Tb12 Generic drug: Guaifenesin  Take 1,200 mg by mouth 2 (two) times daily.   multivitamin with minerals Tabs tablet Take 1 tablet by mouth daily.   omeprazole  40 MG capsule Commonly known as: PRILOSEC Take 1 capsule (40 mg total) by mouth daily before breakfast.   Oxycodone  HCl 10 MG Tabs Take 10 mg by mouth every 4 (four) hours.   potassium chloride  SA 20 MEQ tablet Commonly known as: KLOR-CON  M Take 1 tablet (20 mEq total) by mouth 2 (two) times daily.   rosuvastatin  20 MG tablet Commonly known as: CRESTOR  Take 1 tablet (20 mg total) by mouth at bedtime.   Simethicone  125 MG Caps Take 250 mg by mouth daily as needed (for gas).   spironolactone  25 MG tablet Commonly known as: ALDACTONE  Take 12.5 mg by mouth every other day.   Systane Complete PF 0.6 % Soln Generic drug: Propylene Glycol (PF) Place 2 drops into both eyes daily as needed (dry eyes).   tamsulosin  0.4 MG Caps capsule Commonly known as: FLOMAX  Take 0.4 mg by mouth at bedtime.   timolol  0.5 % ophthalmic solution Commonly known as: TIMOPTIC  Place 1 drop into both eyes at bedtime.   torsemide  20 MG tablet Commonly known as: DEMADEX  Take 2 tablets (40 mg total) by mouth daily. Take additional 20 mg on the days when you gain 3 lbs in 1 day or 5 lbs in 1 week.   Trelegy Ellipta   200-62.5-25 MCG/ACT Aepb Generic drug: Fluticasone -Umeclidin-Vilant Inhale 1 puff into the lungs daily.   Vashe Wound Soln Apply 1 Act topically as directed.   vitamin C  1000 MG tablet Take 1,000 mg by mouth daily.   Vitamin D3 50 MCG (2000 UT) capsule Take 4,000 Units by mouth daily.               Discharge Care Instructions  (From admission, onward)           Start     Ordered   01/27/24 0000  Discharge wound care:       Comments: Cleanse L posterior thigh wound with Vashe wound cleanser, do not rinse. Apply Vashe moistened gauze to wound bed 2 times daily, top with dry gauze and secure with  silicone foam or ABD pad and Kerlix roll gauze whichever works best   01/27/24 1137            Diet recommendation: Cardiac diet  Discharge Exam: Vitals:   01/27/24 0800 01/27/24 0900 01/27/24 1000 01/27/24 1035  BP: (!) 124/51 133/61 (!) 144/67   Pulse: 94 84 93 88  Resp: 19 (!) 25 (!) 22 (!) 23  Temp: 98.3 F (36.8 C)     TempSrc: Oral     SpO2: 96% 96% 93% 96%  Weight:      Height:        Filed Weights   01/20/24 1411  Weight: 100.7 kg   General: in Mild distress, No Rash Cardiovascular: S1 and S2 Present, No Murmur Respiratory: Good respiratory effort, Bilateral Air entry present.  Left basilar crackles, No wheezes Abdomen: Bowel Sound present, No tenderness Extremities: Trace edema.  No erythema.  No warmth to suggest cellulitis. Neuro: Alert and oriented x3, no new focal deficit  Condition at discharge: stable  The results of significant diagnostics from this hospitalization (including imaging, microbiology, ancillary and laboratory) are listed below for reference.   Imaging Studies: DG Swallowing Func-Speech Pathology Result Date: 01/24/2024 Table formatting from the original result was not included. Modified Barium Swallow Study Patient Details Name: LAVAL CAFARO MRN: [id number] Date of Birth: 09-Feb-1950 Today's Date: 01/24/2024 HPI/PMH: HPI: Pt is  a 74 y/o male presenting with AMS, SOB, lethargy. Admitted for severe sepsis due to PNA and cellulitis, AKI. Chest CT also showed possible L pleural effusion. BSE 06/2023 no s/s asspiration, GERD, reg/thin. PMH: arthritis, BPH, COPD, COPD, wife reports ACDF years ago glaucoma, heart murmur, HTN, multiple spinal surgeries, L knee arthroscopy. Palliative following. Wife reports regurgitation after liquids at times and bending over following po's. Recent EDG- unable to locate specific docuumentation. Clinical Impression: Pt exhibits a suspected primary esophageal dysphagia and minimal oral deficits with thicker textures. Recommend regular texture, thin liquids, pills with thin, esophageal precautions and follow up with GI after hospitalization. Clinical Impression: Pt exhibits a suspected primary esophageal dysphagia and minimal oral deficits with thicker textures. Delayed and disorganized transit with puree. Pharyngeal phase was functional without aspiration and normal flash penetration. Trace barium lined aryepiglottic fold at times appearing as penetration and calcification on anterior vestibular wall. He has a spontaneous neck flexion/tuck possibly to facilitate pharyngeal transit due to suspected hyperlordosis. Esophageal scan revealed barium pill stopping mid esophagus not propelled by sips barium or puree with retrograde movement of puree. This is likely causing pt's intermittent coughing episodes with po's. Esophageal precautions educated with pt and wife and recommend follow up with GI after hospitalization. Factors that may increase risk of adverse event in presence of aspiration [name] & [name] 2021): No data recorded Recommendations/Plan: Swallowing Evaluation Recommendations Swallowing Evaluation Recommendations Recommendations: PO diet PO Diet Recommendation: Regular; Thin liquids (Level 0) Liquid Administration via: Straw; Cup Medication Administration: Whole meds with liquid Supervision: Patient able  to self-feed Postural changes: Position pt fully upright for meals; Stay upright 30-60 min after meals Oral care recommendations: Oral care BID (2x/day) Recommended consults: Consider GI consultation Treatment Plan Treatment Plan Treatment recommendations: No treatment recommended at this time Follow-up recommendations: No SLP follow up Functional status assessment: Patient has not had a recent decline in their functional status. Recommendations Recommendations for follow up therapy are one component of a multi-disciplinary discharge planning process, led by the attending physician.  Recommendations may be updated based on patient status, additional functional criteria and  insurance authorization. Assessment: Orofacial Exam: Orofacial Exam Oral Cavity: Oral Hygiene: WFL Oral Cavity - Dentition: Other (Comment) (partials) Orofacial Anatomy: WFL Oral Motor/Sensory Function: WFL Anatomy: Anatomy: Other (Comment); Presence of cervical hardware (possible cervical hyperlodosis) Boluses Administered: Boluses Administered Boluses Administered: Thin liquids (Level 0); Mildly thick liquids (Level 2, nectar thick); Moderately thick liquids (Level 3, honey thick); Puree; Solid  Oral Impairment Domain: Oral Impairment Domain Lip Closure: No labial escape Tongue control during bolus hold: Cohesive bolus between tongue to palatal seal Bolus preparation/mastication: Slow prolonged chewing/mashing with complete recollection Bolus transport/lingual motion: Repetitive/disorganized tongue motion (only with puree) Oral residue: Trace residue lining oral structures Location of oral residue : Floor of mouth Initiation of pharyngeal swallow : Valleculae  Pharyngeal Impairment Domain: Pharyngeal Impairment Domain Soft palate elevation: Trace column of contrast or air between SP and PW Laryngeal elevation: Partial superior movement of thyroid  cartilage/partial approximation of arytenoids to epiglottic petiole (but functional) Anterior hyoid  excursion: Partial anterior movement Epiglottic movement: Complete inversion Laryngeal vestibule closure: Incomplete, narrow column air/contrast in laryngeal vestibule (trace) Pharyngeal stripping wave : Present - diminished Pharyngeal contraction (A/P view only): N/A Pharyngoesophageal segment opening: Complete distension and complete duration, no obstruction of flow Tongue base retraction: No contrast between tongue base and posterior pharyngeal wall (PPW) Pharyngeal residue: Collection of residue within or on pharyngeal structures (minimal) Location of pharyngeal residue: Valleculae  Esophageal Impairment Domain: Esophageal Impairment Domain Esophageal clearance upright position: Esophageal retention with retrograde flow below pharyngoesophageal segment (PES) Pill: Pill Consistency administered: Thin liquids (Level 0) Thin liquids (Level 0): Impaired (see clinical impressions) (difficulty transiting with thin- needed puree. Pill in valleculae transited with thin.) Penetration/Aspiration Scale Score: Penetration/Aspiration Scale Score 1.  Material does not enter airway: Mildly thick liquids (Level 2, nectar thick); Moderately thick liquids (Level 3, honey thick); Puree; Solid; Pill 2.  Material enters airway, remains ABOVE vocal cords then ejected out: Thin liquids (Level 0) Compensatory Strategies: Compensatory Strategies Compensatory strategies: No   General Information: Caregiver present: No  Diet Prior to this Study: Regular; Thin liquids (Level 0)   Temperature : Normal   Respiratory Status: WFL   Supplemental O2: Nasal cannula   History of Recent Intubation: No  Behavior/Cognition: Alert; Pleasant mood; Cooperative Self-Feeding Abilities: Able to self-feed Baseline vocal quality/speech: Normal Volitional Cough: Able to elicit Volitional Swallow: Able to elicit Exam Limitations: No limitations Goal Planning: Prognosis for improved oropharyngeal function: -- (fair-good) No data recorded No data recorded No  data recorded Consulted and agree with results and recommendations: Patient; Family member/caregiver; Nurse Pain: Pain Assessment Pain Assessment: No/denies pain Faces Pain Scale: 6 Pain Location: Generalized movement (Pt reports pain is difficult to describe, noted to occur with movement of B LE) Pain Descriptors / Indicators: Grimacing; Guarding; Moaning Pain Intervention(s): Limited activity within patient's tolerance; Repositioned; Monitored during session End of Session: Start Time:SLP Start Time (ACUTE ONLY): 1443 Stop Time: SLP Stop Time (ACUTE ONLY): 1504 Time Calculation:SLP Time Calculation (min) (ACUTE ONLY): 21 min Charges: SLP Evaluations $ SLP Speech Visit: 1 Visit SLP Evaluations $BSS Swallow: 1 Procedure $MBS Swallow: 1 Procedure SLP visit diagnosis: SLP Visit Diagnosis: Other (comment); Dysphagia, oral phase (R13.11) (suspect primary esophageal dysphagia) Past Medical History: Past Medical History: Diagnosis Date  Acute heart failure with preserved ejection fraction (HCC) 11/12/2022  Arthritis   Back pain   Radiates down both legs  BPH (benign prostatic hyperplasia)   Cancer (HCC) 2022  prostate cancer  COPD (chronic obstructive pulmonary disease) (HCC)  Dyspnea   Enlarged prostate   GERD (gastroesophageal reflux disease)   Glaucoma   H/O hiatal hernia   H/O wheezing   occ inhaler usage  Heart murmur   Hypertension   Lumbar foraminal stenosis   Macular degeneration   Meningioma (HCC) 2014  Treated with radiation  Neuromuscular disorder (HCC)   Neuropathy bilateral legs  PONV (postoperative nausea and vomiting)   Pulmonary nodules   resolved on 2019 follow up CT  S/P insertion of spinal cord stimulator  Past Surgical History: Past Surgical History: Procedure Laterality Date  ABDOMINAL EXPOSURE N/A 03/16/2022  Procedure: ABDOMINAL EXPOSURE;  Surgeon: Gretta Lonni PARAS, MD;  Location: [facility] OR;  Service: Vascular;  Laterality: N/A;  ANTERIOR LUMBAR FUSION N/A 03/16/2022  Procedure: Anterior Lumbar  Interbody Fusion - Lumbar Five-Sacral One with cage;  Surgeon: Louis Shove, MD;  Location: [facility] OR;  Service: Neurosurgery;  Laterality: N/A;  BACK SURGERY  06/08/2006  lam x3 cerv x 1  CERVICAL FUSION    COLONOSCOPY  2020  CYSTOSCOPY    74 yrs old  CYSTOSCOPY WITH URETHRAL DILATATION N/A 09/21/2021  Procedure: CYSTOSCOPY WITH BALLOON DILATION and OPTILUME BALLOON DILATION OF  URETHRAL DILATATION;  Surgeon: Elisabeth Valli BIRCH, MD;  Location: [facility] ORS;  Service: Urology;  Laterality: N/A;  EYE SURGERY    bil  GOLD SEED IMPLANT N/A 09/06/2020  Procedure: GOLD SEED IMPLANT/ PLACEMENT OF FIDUCIAL MARKERS;  Surgeon: Elisabeth Valli BIRCH, MD;  Location: [facility] ORS;  Service: Urology;  Laterality: N/A;  KNEE ARTHROSCOPY  01/02/2008  lft  KNEE ARTHROSCOPY Left 07/23/2012  Procedure: LEFT MEDIAL MENISCAL DEBRIDEMENT AND CHONDROPLASTY;  Surgeon: Dempsey LULLA Moan, MD;  Location: [facility] ORS;  Service: Orthopedics;  Laterality: Left;  LUMBAR FUSION  02/22/2014  DR POOL  PILONIDAL CYST EXCISION    SINUS EXPLORATION    SPACE OAR INSTILLATION N/A 09/06/2020  Procedure: SPACE OAR INSTILLATION;  Surgeon: Elisabeth Valli BIRCH, MD;  Location: [facility] OR[facility];  Service: Urology;  Laterality: N/A;  SPINAL CORD STIMULATOR INSERTION N/A 01/16/2018  Procedure: LUMBAR SPINAL CORD STIMULATOR INSERTION;  Surgeon: Joshua Alm RAMAN, MD;  Location: [facility] OR;  Service: Neurosurgery;  Laterality: N/A;  LUMBAR SPINAL CORD STIMULATOR INSERTION  TRANSURETHRAL RESECTION OF PROSTATE N/A 09/06/2020  Procedure: TRANSURETHRAL RESECTION OF THE PROSTATE (TURP);  Surgeon: Elisabeth Valli BIRCH, MD;  Location: [facility];  Service: Urology;  Laterality: N/A;  2 HRS  WRIST GANGLION EXCISION    rt  WRIST SURGERY  01/02/2007  cyst lft [name] 01/24/2024, 5:22 PM  ECHOCARDIOGRAM COMPLETE Result Date: 01/23/2024    ECHOCARDIOGRAM REPORT   Patient Name:   DAYN BARICH Date of Exam: 01/23/2024 Medical Rec #:  [id number]        Height:       66.5 in Accession #:    [id number]       Weight:        222.0 lb Date of Birth:  01-25-1950         BSA:          2.102 m Patient Age:    73 years         BP:           152/70 mmHg Patient Gender: M                HR:           114 bpm. Exam Location:  Inpatient Procedure: 2D Echo, Cardiac Doppler and Color Doppler (Both Spectral and Color  Flow Doppler were utilized during procedure). Indications:    Dyspnea R06.00  History:        Patient has prior history of Echocardiogram examinations, most                 recent 11/07/2022. COPD.  Sonographer:    Nathanel Devonshire Referring Phys: [id number] Raylie Maddison M Kindal Ponti IMPRESSIONS  1. Left ventricular ejection fraction, by estimation, is >75%. The left ventricle has hyperdynamic function. The left ventricle has no regional wall motion abnormalities. Indeterminate diastolic filling due to E-A fusion.  2. Right ventricular systolic function is normal. The right ventricular size is normal. Tricuspid regurgitation signal is inadequate for assessing PA pressure.  3. Right atrial size was grossly normal.  4. The mitral valve is grossly normal. No evidence of mitral valve regurgitation. No evidence of mitral stenosis.  5. The aortic valve is tricuspid. Aortic valve regurgitation is not visualized. Aortic valve sclerosis is present, with no evidence of aortic valve stenosis.  6. The inferior vena cava is normal in size with greater than 50% respiratory variability, suggesting right atrial pressure of 3 mmHg. Comparison(s): A prior study was performed on 11/07/2022. LVEF 70-75%, grade I diastolic dysfunction. FINDINGS  Left Ventricle: Left ventricular ejection fraction, by estimation, is >75%. The left ventricle has hyperdynamic function. The left ventricle has no regional wall motion abnormalities. The left ventricular internal cavity size was normal in size. There is no left ventricular hypertrophy. Indeterminate diastolic filling due to E-A fusion. Right Ventricle: The right ventricular size is normal. No increase in right ventricular  wall thickness. Right ventricular systolic function is normal. Tricuspid regurgitation signal is inadequate for assessing PA pressure. Left Atrium: Left atrial size was normal in size. Right Atrium: Right atrial size was grossly normal. Pericardium: There is no evidence of pericardial effusion. Mitral Valve: The mitral valve is grossly normal. No evidence of mitral valve regurgitation. No evidence of mitral valve stenosis. Tricuspid Valve: The tricuspid valve is grossly normal. Tricuspid valve regurgitation is not demonstrated. No evidence of tricuspid stenosis. Aortic Valve: The aortic valve is tricuspid. Aortic valve regurgitation is not visualized. Aortic valve sclerosis is present, with no evidence of aortic valve stenosis. Aortic valve mean gradient measures 12.0 mmHg. Aortic valve peak gradient measures 19.5 mmHg. Aortic valve area, by VTI measures 3.47 cm. Pulmonic Valve: The pulmonic valve was not well visualized. Pulmonic valve regurgitation is not visualized. Aorta: The aortic root and ascending aorta are structurally normal, with no evidence of dilitation. Venous: The inferior vena cava is normal in size with greater than 50% respiratory variability, suggesting right atrial pressure of 3 mmHg. IAS/Shunts: The interatrial septum was not well visualized.  LEFT VENTRICLE PLAX 2D LVIDd:         4.90 cm     Diastology LVIDs:         2.80 cm     LV e' medial:    7.00 cm/s LV PW:         0.70 cm     LV E/e' medial:  13.9 LV IVS:        0.90 cm     LV e' lateral:   10.60 cm/s LVOT diam:     2.10 cm     LV E/e' lateral: 9.2 LV SV:         116 LV SV Index:   55 LVOT Area:     3.46 cm LV IVRT:       127 msec  LV Volumes (MOD)  LV vol d, MOD A2C: 54.5 ml LV vol d, MOD A4C: 51.4 ml LV vol s, MOD A2C: 14.9 ml LV vol s, MOD A4C: 15.6 ml LV SV MOD A2C:     39.6 ml LV SV MOD A4C:     51.4 ml LV SV MOD BP:      38.4 ml RIGHT VENTRICLE          IVC RV Basal diam:  2.60 cm  IVC diam: 2.00 cm TAPSE (M-mode): 2.1 cm                           PULMONARY VEINS                          Systolic Velocity: 32.70 cm/s LEFT ATRIUM             Index LA diam:        2.90 cm 1.38 cm/m LA Vol (A2C):   24.0 ml 11.42 ml/m LA Vol (A4C):   30.6 ml 14.56 ml/m LA Biplane Vol: 27.5 ml 13.08 ml/m  AORTIC VALVE                     PULMONIC VALVE AV Area (Vmax):    3.20 cm      PV Vmax:       1.32 m/s AV Area (Vmean):   2.98 cm      PV Peak grad:  7.0 mmHg AV Area (VTI):     3.47 cm AV Vmax:           221.00 cm/s AV Vmean:          164.000 cm/s AV VTI:            0.335 m AV Peak Grad:      19.5 mmHg AV Mean Grad:      12.0 mmHg LVOT Vmax:         204.00 cm/s LVOT Vmean:        141.000 cm/s LVOT VTI:          0.336 m LVOT/AV VTI ratio: 1.00  AORTA Ao Root diam: 3.00 cm Ao Asc diam:  3.00 cm MITRAL VALVE MV Area (PHT): 7.44 cm     SHUNTS MV Decel Time: 102 msec     Systemic VTI:  0.34 m MV E velocity: 97.20 cm/s   Systemic Diam: 2.10 cm MV A velocity: 165.00 cm/s MV E/A ratio:  0.59 Sunit Tolia Electronically signed by Madonna Large Signature Date/Time: 01/23/2024/2:11:49 PM    Final    CT HEAD WO CONTRAST ( ) Result Date: 01/23/2024 EXAM: CT HEAD WITHOUT CONTRAST 01/23/2024 09:02:26 AM TECHNIQUE: CT of the head was performed without the administration of intravenous contrast. Automated exposure control, iterative reconstruction, and/or weight based adjustment of the mA/kV was utilized to reduce the radiation dose to as low as reasonably achievable. COMPARISON: Brain MRI 08/15/2007 and CT head 09/14/2022. CLINICAL HISTORY: 74 year old male hospitalized for left lower extremity wound, shortness of breath, lethargy, hypotension, and altered mental status. FINDINGS: BRAIN AND VENTRICLES: Brain volume stable since 2024, within normal limits for age. No acute hemorrhage. No evidence of acute infarct. No hydrocephalus. No extra-axial collection. No mass effect or midline shift. 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 No regional edema or significant intracranial mass effect. Mild for age periventricular white matter  hypodensity is stable. No suspicious intracranial vascular hyperdensity. Calcified atherosclerosis at the skull base. ORBITS: No acute abnormality. SINUSES: Stable and generally well aerated paranasal sinuses, tympanic cavities and mastoids. SOFT TISSUES AND SKULL: No acute soft tissue abnormality. No skull fracture. IMPRESSION: 1. No acute intracranial abnormality. 2. Stable chronic right tentorial meningioma since 2019 without edema or mass effect. Electronically signed by: Helayne Hurst MD 01/23/2024 09:17 AM EST RP Workstation: [id number]   CT ABDOMEN PELVIS W CONTRAST Result Date: 01/23/2024 CLINICAL DATA:  Abdominal pain EXAM: CT ABDOMEN AND PELVIS WITH CONTRAST TECHNIQUE: Multidetector CT imaging of the abdomen and pelvis was performed using the standard protocol following bolus administration of intravenous contrast. RADIATION DOSE REDUCTION: This exam was performed according to the departmental dose-optimization program which includes automated exposure control, adjustment of the mA and/or kV according to patient size and/or use of iterative reconstruction technique. CONTRAST:  75mL OMNIPAQUE  IOHEXOL  350 MG/ML SOLN COMPARISON:  December 31, 2022.  June 13, 2020. FINDINGS: Lower chest: Left lower lobe airspace opacity is noted concerning for pneumonia with possible adjacent left pleural effusion. Hepatobiliary: No focal liver abnormality is seen. No gallstones, gallbladder wall thickening, or biliary dilatation. Pancreas: Unremarkable. No pancreatic ductal dilatation or surrounding inflammatory changes. Spleen: Normal in size without focal abnormality. Adrenals/Urinary Tract: Adrenal glands are unremarkable. Kidneys are normal, without renal calculi, focal lesion, or hydronephrosis. Bladder is unremarkable. Stomach/Bowel: Stomach is within normal  limits. Appendix appears normal. No evidence of bowel wall thickening, distention, or inflammatory changes. Vascular/Lymphatic: Aortic atherosclerosis. No enlarged abdominal or pelvic lymph nodes. Reproductive: Status post partial prostatic resection. Other: No ascites or hernia. Musculoskeletal: Postsurgical changes are seen in the lumbar spine. No acute osseous abnormality is noted. IMPRESSION: 1. Left lower lobe airspace opacity is noted concerning for pneumonia with possible adjacent left pleural effusion. 2. No acute abnormality seen in the abdomen or pelvis. 3. Aortic atherosclerosis. Aortic Atherosclerosis (ICD10-I70.0). Electronically Signed   By: Lynwood Landy Raddle M.D.   On: 01/23/2024 09:15   VAS US  LOWER EXTREMITY VENOUS (DVT) Result Date: 01/22/2024  Lower Venous DVT Study Patient Name:  DETRAVION TESTER  Date of Exam:   01/22/2024 Medical Rec #: [id number]         Accession #:    [id number] Date of Birth: Feb 09, 1950          Patient Gender: M Patient Age:   1 years Exam Location:  [facility] Procedure:      VAS US  LOWER EXTREMITY VENOUS (DVT) Referring Phys: Kiko Ripp --------------------------------------------------------------------------------  Indications: Edema.  Limitations: Poor ultrasound/tissue interface. Comparison Study: Previous exam on 08/09/21 and 01/30/19 were negative for DVT Performing Technologist: Ezzie Potters RVT, RDMS  Examination Guidelines: A complete evaluation includes B-mode imaging, spectral Doppler, color Doppler, and power Doppler as needed of all accessible portions of each vessel. Bilateral testing is considered an integral part of a complete examination. Limited examinations for reoccurring indications may be performed as noted. The reflux portion of the exam is performed with the patient in reverse Trendelenburg.  +---------+---------------+---------+-----------+----------+--------------+ RIGHT    CompressibilityPhasicitySpontaneityPropertiesThrombus Aging  +---------+---------------+---------+-----------+----------+--------------+ CFV      Full           Yes      Yes                                 +---------+---------------+---------+-----------+----------+--------------+ SFJ      Full                                                        +---------+---------------+---------+-----------+----------+--------------+  FV Prox  Full           Yes      Yes                                 +---------+---------------+---------+-----------+----------+--------------+ FV Mid   Full           Yes      Yes                                 +---------+---------------+---------+-----------+----------+--------------+ FV DistalFull           Yes      Yes                                 +---------+---------------+---------+-----------+----------+--------------+ PFV      Full                                                        +---------+---------------+---------+-----------+----------+--------------+ POP      Full           Yes      Yes                                 +---------+---------------+---------+-----------+----------+--------------+ PTV      Full                                                        +---------+---------------+---------+-----------+----------+--------------+ PERO     Full                                                        +---------+---------------+---------+-----------+----------+--------------+   +--------+---------------+---------+-----------+----------+--------------------+ LEFT    CompressibilityPhasicitySpontaneityPropertiesThrombus Aging       +--------+---------------+---------+-----------+----------+--------------------+ CFV     Full           Yes      Yes                                       +--------+---------------+---------+-----------+----------+--------------------+ SFJ     Full                                                               +--------+---------------+---------+-----------+----------+--------------------+ FV Prox Full           Yes      Yes                                       +--------+---------------+---------+-----------+----------+--------------------+  FV Mid  Full           Yes      Yes                                       +--------+---------------+---------+-----------+----------+--------------------+ FV                     Yes      Yes                  patent by            Distal                                               color/doppler        +--------+---------------+---------+-----------+----------+--------------------+ POP     Full           Yes      Yes                                       +--------+---------------+---------+-----------+----------+--------------------+ PTV                                                  unable to visualize  +--------+---------------+---------+-----------+----------+--------------------+ PERO                                                 unable to visualize  +--------+---------------+---------+-----------+----------+--------------------+   Left Technical Findings: Distal FV patent by color/doppler - patient unable to tolerate compression due to pain. Peroneal and posterior tibial veins were unable to be visualized on this exam.   Summary: BILATERAL: -No evidence of popliteal cyst, bilaterally. RIGHT: - There is no evidence of deep vein thrombosis in the lower extremity.  LEFT: - There is no evidence of deep vein thrombosis in the lower extremity. However, portions of this examination were limited- see technologist comments above.  *See table(s) above for measurements and observations. Electronically signed by Norman Serve on 01/22/2024 at 2:46:09 PM.    Final    DG CHEST PORT 1 VIEW Result Date: 01/22/2024 CLINICAL DATA:  Shortness of breath EXAM: PORTABLE CHEST 1 VIEW COMPARISON:  Two days ago FINDINGS: The heart size and mediastinal  contours are within normal limits. Right lung is clear. Stable diffuse left lung opacity is noted concerning for pneumonia. The visualized skeletal structures are unremarkable. IMPRESSION: Stable diffuse left lung opacity is noted concerning for pneumonia. Electronically Signed   By: Lynwood Landy Raddle M.D.   On: 01/22/2024 13:12   US  EKG SITE RITE Result Date: 01/22/2024 If Site Rite image not attached, placement could not be confirmed due to current cardiac rhythm.  CT FEMUR LEFT W CONTRAST Result Date: 01/20/2024 EXAM: CT LEFT THIGH, WITH IV CONTRAST 01/20/2024 07:24:00 PM TECHNIQUE: Axial images were acquired through the left thigh with IV contrast. Reformatted images were reviewed. Automated exposure control, iterative reconstruction, and/or  weight based adjustment of the mA/kV was utilized to reduce the radiation dose to as low as reasonably achievable. 75 mL (iohexol  (OMNIPAQUE ) 350 MG/ML injection 75 mL IOHEXOL  350 MG/ML SOLN) was administered intravenously. COMPARISON: None available. CLINICAL HISTORY: Soft tissue mass, thigh, deep FINDINGS: BONES AND JOINTS: No acute fracture or focal osseous lesion. No dislocation. The joint spaces are normal. SOFT TISSUES: No evidence of left lower extremity DVT in the visualized venous structures. Arterial atherosclerosis in the left iliofemoral vessels. Stranding within the subcutaneous soft tissues medially in the left upper to mid thigh. In the appropriate clinical setting, this could reflect cellulitis. No focal fluid collection. IMPRESSION: 1. Stranding within the subcutaneous soft tissues medially in the left upper to mid thigh, which could reflect cellulitis. No focal fluid collection. Electronically signed by: Franky Crease MD 01/20/2024 07:43 PM EST RP Workstation: [id number]   CT Angio Chest PE W/Cm &/Or Wo Cm Result Date: 01/20/2024 EXAM: CTA CHEST 01/20/2024 07:24:00 PM TECHNIQUE: CTA of the chest was performed without and with the administration of 75  mL of iohexol  (OMNIPAQUE ) 350 MG/ML injection. Multiplanar reformatted images are provided for review. MIP images are provided for review. Automated exposure control, iterative reconstruction, and/or weight based adjustment of the mA/kV was utilized to reduce the radiation dose to as low as reasonably achievable. COMPARISON: None available. CLINICAL HISTORY: Pulmonary embolism (PE) suspected, high prob. FINDINGS: PULMONARY ARTERIES: Pulmonary arteries are adequately opacified for evaluation. No acute pulmonary embolus. Main pulmonary artery is normal in caliber. MEDIASTINUM: The heart and pericardium demonstrate no acute abnormality. Coronary artery and aortic atherosclerosis. There is no acute abnormality of the thoracic aorta. LYMPH NODES: No mediastinal, hilar or axillary lymphadenopathy. LUNGS AND PLEURA: Consolidation throughout the left lung compatible with pneumonia. Patchy ground glass opacities also noted in the right upper lobe, also likely pneumonia. No pleural effusions. No pneumothorax. UPPER ABDOMEN: Limited images of the upper abdomen are unremarkable. SOFT TISSUES AND BONES: No acute bone or soft tissue abnormality. IMPRESSION: 1. No pulmonary embolism. 2. Consolidation throughout the left lung and patchy ground glass opacities in the right upper lobe, compatible with pneumonia. 3. Coronary artery disease, aortic atherosclerosis. Electronically signed by: Franky Crease MD 01/20/2024 07:40 PM EST RP Workstation: [id number]   DG Chest Port 1 View if patient is in a treatment room. Result Date: 01/20/2024 CLINICAL DATA:  Suspected sepsis EXAM: PORTABLE CHEST 1 VIEW COMPARISON:  01/15/2024, chest CT 11/06/2023 FINDINGS: Hardware in the cervical spine. Borderline cardiomegaly with aortic atherosclerosis. Widespread heterogeneous and slightly nodular consolidation and ground-glass disease throughout the left thorax, worse compared with 01/15/2024. Possible trace left effusion. No pneumothorax. Possible  calcified loose bodies about the left shoulder. IMPRESSION: Widespread heterogeneous and slightly nodular consolidation and ground-glass disease throughout the left thorax, worse compared with 01/15/2024. Findings could be due to pneumonia, imaging follow-up to resolution is advised Electronically Signed   By: Luke Bun M.D.   On: 01/20/2024 15:44   DG Chest 2 View Result Date: 01/18/2024 CLINICAL DATA:  FU recurrent pneumonia EXAM: CHEST - 2 VIEW COMPARISON:  June 25, 2023, November 06, 2023 FINDINGS: The cardiomediastinal silhouette is unchanged in contour. No pleural effusion. No pneumothorax. Persistent heterogeneous airspace opacities in the LEFT mid and lower lung. Visualized abdomen is unremarkable. Multilevel degenerative changes of the thoracic spine. Spinal stimulator. IMPRESSION: Persistent heterogeneous airspace opacities in the LEFT mid and lower lung. Recommend further evaluation with follow-up chest CT as per recommendation from chest CT from November 2025. Electronically Signed  By: Corean Salter M.D.   On: 01/18/2024 18:23    Microbiology: Results for orders placed or performed during the hospital encounter of 01/20/24  Blood Culture (routine x 2)     Status: None   Collection Time: 01/20/24  2:37 PM   Specimen: BLOOD RIGHT HAND  Result Value Ref Range Status   Specimen Description BLOOD RIGHT HAND  Final   Special Requests   Final    BOTTLES DRAWN AEROBIC AND ANAEROBIC Blood Culture adequate volume   Culture   Final    NO GROWTH 5 DAYS Performed at [facility], [address] [place], [place] [address]    Report Status 01/25/2024 FINAL  Final  Blood Culture (routine x 2)     Status: None   Collection Time: 01/20/24  2:58 PM   Specimen: BLOOD LEFT ARM  Result Value Ref Range Status   Specimen Description BLOOD LEFT ARM  Final   Special Requests   Final    BOTTLES DRAWN AEROBIC ONLY Blood Culture results may not be optimal due to an inadequate volume of  blood received in culture bottles   Culture   Final    NO GROWTH 5 DAYS Performed at [facility], [address] [place], [place] [address]    Report Status 01/25/2024 FINAL  Final  Resp panel by RT-PCR (RSV, Flu A&B, Covid) Anterior Nasal Swab     Status: None   Collection Time: 01/20/24  5:44 PM   Specimen: Anterior Nasal Swab  Result Value Ref Range Status   SARS Coronavirus 2 by RT PCR NEGATIVE NEGATIVE Final   Influenza A by PCR NEGATIVE NEGATIVE Final   Influenza B by PCR NEGATIVE NEGATIVE Final    Comment: (NOTE) The Xpert Xpress SARS-CoV-2/FLU/RSV plus assay is intended as an aid in the diagnosis of influenza from Nasopharyngeal swab specimens and should not be used as a sole basis for treatment. Nasal washings and aspirates are unacceptable for Xpert Xpress SARS-CoV-2/FLU/RSV testing.  Fact Sheet for Patients: bloggercourse.com  Fact Sheet for Healthcare Providers: seriousbroker.it  This test is not yet approved or cleared by the [place]  FDA and has been authorized for detection and/or diagnosis of SARS-CoV-2 by FDA under an Emergency Use Authorization (EUA). This EUA will remain in effect (meaning this test can be used) for the duration of the COVID-19 declaration under Section 564(b)(1) of the Act, 21 U.S.C. section 360bbb-3(b)(1), unless the authorization is terminated or revoked.     Resp Syncytial Virus by PCR NEGATIVE NEGATIVE Final    Comment: (NOTE) Fact Sheet for Patients: bloggercourse.com  Fact Sheet for Healthcare Providers: seriousbroker.it  This test is not yet approved or cleared by the [place]  FDA and has been authorized for detection and/or diagnosis of SARS-CoV-2 by FDA under an Emergency Use Authorization (EUA). This EUA will remain in effect (meaning this test can be used) for the duration of the COVID-19 declaration under  Section 564(b)(1) of the Act, 21 U.S.C. section 360bbb-3(b)(1), unless the authorization is terminated or revoked.  Performed at [facility], [address], [place], [place] [address]    Labs: CBC: Recent Labs  Lab 01/23/24 0256 01/24/24 0312 01/25/24 0316 01/26/24 0502 01/27/24 0322  WBC 22.9* 19.6* 12.6* 11.6* 15.0*  NEUTROABS 20.7* 15.6* 8.4* 9.0* 11.8*  HGB 14.2 14.3 14.2 13.7 13.6  HCT 41.1 41.1 41.6 41.0 41.7  MCV 88.2 89.2 90.0 91.5 91.9  PLT 291 279 240 245 258  Basic Metabolic Panel: Recent Labs  Lab 01/22/24 0331 01/23/24 0256 01/24/24 0312 01/24/24 1858 01/25/24 0316 01/26/24 0502 01/27/24 0322  NA 133*   < > 140 138 140 139 135  K 4.6   < > 2.7* 2.8* 2.7* 3.3* 3.4*  CL 97*   < > 98 94* 96* 99 95*  CO2 28   < > 30 32 32 31 31  GLUCOSE 86   < > 90 108* 93 81 98  BUN 23   < > 28* 26* 25* 22 18  CREATININE 0.67   < > 0.77 0.69 0.68 0.65 0.63  CALCIUM  9.1   < > 9.4 8.9 8.5* 8.6* 8.9  MG 2.0   < > 1.8 1.9 1.7 1.6* 2.0  PHOS 1.5*  --  1.8* 3.0  --  3.1  --    < > = values in this interval not displayed.   Liver Function Tests: Recent Labs  Lab 01/23/24 0833  AST 44*  ALT 22  ALKPHOS 167*  BILITOT 0.8  PROT 5.8*  ALBUMIN  3.0*   CBG: No results for input(s): GLUCAP in the last 168 hours.  Discharge time spent: 35 minutes  Author: Yetta Blanch, MD  [facility] 01/27/2024   "

## 2024-02-13 ENCOUNTER — Other Ambulatory Visit

## 2024-02-19 ENCOUNTER — Ambulatory Visit: Admitting: Primary Care

## 2024-04-16 ENCOUNTER — Ambulatory Visit: Admitting: Internal Medicine
